# Patient Record
Sex: Female | Born: 1945
Health system: Southern US, Community
[De-identification: ages and names within clinical notes are randomized; demographics above are authoritative.]

## PROBLEM LIST (undated history)

## (undated) ENCOUNTER — Emergency Department: Admission: EM | Payer: PPO | Source: Home / Self Care

## (undated) DIAGNOSIS — I96 Gangrene, not elsewhere classified: Secondary | ICD-10-CM

## (undated) DIAGNOSIS — N182 Chronic kidney disease, stage 2 (mild): Secondary | ICD-10-CM

## (undated) DIAGNOSIS — Z950 Presence of cardiac pacemaker: Secondary | ICD-10-CM

## (undated) DIAGNOSIS — I4439 Other atrioventricular block: Secondary | ICD-10-CM

## (undated) DIAGNOSIS — I4430 Unspecified atrioventricular block: Secondary | ICD-10-CM

## (undated) DIAGNOSIS — B9620 Unspecified Escherichia coli [E. coli] as the cause of diseases classified elsewhere: Secondary | ICD-10-CM

## (undated) DIAGNOSIS — R079 Chest pain, unspecified: Secondary | ICD-10-CM

## (undated) DIAGNOSIS — F411 Generalized anxiety disorder: Secondary | ICD-10-CM

## (undated) DIAGNOSIS — F1010 Alcohol abuse, uncomplicated: Secondary | ICD-10-CM

## (undated) DIAGNOSIS — M5416 Radiculopathy, lumbar region: Secondary | ICD-10-CM

## (undated) DIAGNOSIS — I10 Essential (primary) hypertension: Secondary | ICD-10-CM

## (undated) DIAGNOSIS — I739 Peripheral vascular disease, unspecified: Secondary | ICD-10-CM

## (undated) DIAGNOSIS — J189 Pneumonia, unspecified organism: Secondary | ICD-10-CM

## (undated) DIAGNOSIS — J9611 Chronic respiratory failure with hypoxia: Secondary | ICD-10-CM

## (undated) DIAGNOSIS — I480 Paroxysmal atrial fibrillation: Secondary | ICD-10-CM

## (undated) DIAGNOSIS — H9313 Tinnitus, bilateral: Secondary | ICD-10-CM

## (undated) DIAGNOSIS — S0990XA Unspecified injury of head, initial encounter: Secondary | ICD-10-CM

## (undated) DIAGNOSIS — R221 Localized swelling, mass and lump, neck: Secondary | ICD-10-CM

## (undated) DIAGNOSIS — K30 Functional dyspepsia: Secondary | ICD-10-CM

## (undated) DIAGNOSIS — I4510 Unspecified right bundle-branch block: Secondary | ICD-10-CM

## (undated) DIAGNOSIS — E559 Vitamin D deficiency, unspecified: Secondary | ICD-10-CM

## (undated) DIAGNOSIS — M7989 Other specified soft tissue disorders: Secondary | ICD-10-CM

## (undated) DIAGNOSIS — J45909 Unspecified asthma, uncomplicated: Secondary | ICD-10-CM

## (undated) DIAGNOSIS — J309 Allergic rhinitis, unspecified: Secondary | ICD-10-CM

## (undated) DIAGNOSIS — F322 Major depressive disorder, single episode, severe without psychotic features: Secondary | ICD-10-CM

## (undated) DIAGNOSIS — R030 Elevated blood-pressure reading, without diagnosis of hypertension: Secondary | ICD-10-CM

## (undated) DIAGNOSIS — K219 Gastro-esophageal reflux disease without esophagitis: Secondary | ICD-10-CM

## (undated) DIAGNOSIS — Z720 Tobacco use: Secondary | ICD-10-CM

## (undated) DIAGNOSIS — M25512 Pain in left shoulder: Secondary | ICD-10-CM

## (undated) DIAGNOSIS — J441 Chronic obstructive pulmonary disease with (acute) exacerbation: Secondary | ICD-10-CM

## (undated) DIAGNOSIS — R928 Other abnormal and inconclusive findings on diagnostic imaging of breast: Secondary | ICD-10-CM

## (undated) DIAGNOSIS — N939 Abnormal uterine and vaginal bleeding, unspecified: Secondary | ICD-10-CM

## (undated) DIAGNOSIS — R042 Hemoptysis: Secondary | ICD-10-CM

## (undated) DIAGNOSIS — J9601 Acute respiratory failure with hypoxia: Secondary | ICD-10-CM

## (undated) DIAGNOSIS — R071 Chest pain on breathing: Secondary | ICD-10-CM

## (undated) DIAGNOSIS — R002 Palpitations: Secondary | ICD-10-CM

## (undated) DIAGNOSIS — M25562 Pain in left knee: Secondary | ICD-10-CM

## (undated) DIAGNOSIS — F419 Anxiety disorder, unspecified: Secondary | ICD-10-CM

## (undated) DIAGNOSIS — N390 Urinary tract infection, site not specified: Secondary | ICD-10-CM

## (undated) DIAGNOSIS — S0992XA Unspecified injury of nose, initial encounter: Secondary | ICD-10-CM

## (undated) HISTORY — DX: Unspecified Escherichia coli (E. coli) as the cause of diseases classified elsewhere: B96.20

## (undated) HISTORY — DX: Elevated blood-pressure reading, without diagnosis of hypertension: R03.0

## (undated) HISTORY — DX: Pain in left shoulder: M25.512

## (undated) HISTORY — DX: Functional dyspepsia: K30

## (undated) HISTORY — DX: Chronic obstructive pulmonary disease with (acute) exacerbation: J44.1

## (undated) HISTORY — DX: Tinnitus, bilateral: H93.13

## (undated) HISTORY — DX: Essential (primary) hypertension: I10

## (undated) HISTORY — DX: Major depressive disorder, single episode, severe without psychotic features: F32.2

## (undated) HISTORY — DX: Unspecified atrioventricular block: I44.30

## (undated) HISTORY — DX: Presence of cardiac pacemaker: Z95.0

## (undated) HISTORY — DX: Peripheral vascular disease, unspecified: I73.9

## (undated) HISTORY — DX: Other specified soft tissue disorders: M79.89

## (undated) HISTORY — DX: Generalized anxiety disorder: F41.1

## (undated) HISTORY — DX: Urinary tract infection, site not specified: N39.0

## (undated) HISTORY — DX: Tobacco use: Z72.0

## (undated) HISTORY — DX: Abnormal uterine and vaginal bleeding, unspecified: N93.9

## (undated) HISTORY — DX: Vitamin D deficiency, unspecified: E55.9

## (undated) HISTORY — DX: Radiculopathy, lumbar region: M54.16

## (undated) HISTORY — DX: Paroxysmal atrial fibrillation: I48.0

## (undated) HISTORY — DX: Unspecified injury of nose, initial encounter: S09.92XA

## (undated) HISTORY — DX: Chronic kidney disease, stage 2 (mild): N18.2

## (undated) HISTORY — DX: Unspecified right bundle-branch block: I45.10

## (undated) HISTORY — DX: Localized swelling, mass and lump, neck: R22.1

## (undated) HISTORY — DX: Other abnormal and inconclusive findings on diagnostic imaging of breast: R92.8

## (undated) HISTORY — DX: Chest pain, unspecified: R07.9

## (undated) HISTORY — DX: Chronic respiratory failure with hypoxia: J96.11

## (undated) HISTORY — PX: PACEMAKER INSERTION: SHX728

## (undated) HISTORY — DX: Hemoptysis: R04.2

## (undated) HISTORY — DX: Acute respiratory failure with hypoxia: J96.01

## (undated) HISTORY — DX: Other atrioventricular block: I44.39

## (undated) HISTORY — DX: Pneumonia, unspecified organism: J18.9

## (undated) HISTORY — DX: Unspecified injury of head, initial encounter: S09.90XA

## (undated) HISTORY — DX: Pain in left knee: M25.562

## (undated) HISTORY — DX: Allergic rhinitis, unspecified: J30.9

## (undated) HISTORY — DX: Palpitations: R00.2

## (undated) HISTORY — DX: Chest pain on breathing: R07.1

## (undated) HISTORY — DX: Unspecified asthma, uncomplicated: J45.909

## (undated) HISTORY — DX: Alcohol abuse, uncomplicated: F10.10

---
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
## 1972-08-15 HISTORY — PX: ECTOPIC PREGNANCY SURGERY: SHX613

## 2009-06-30 ENCOUNTER — Emergency Department (HOSPITAL_BASED_OUTPATIENT_CLINIC_OR_DEPARTMENT_OTHER): Admission: EM | Admit: 2009-06-30 | Discharge: 2009-06-30 | Payer: Self-pay | Admitting: Emergency Medicine

## 2009-06-30 ENCOUNTER — Ambulatory Visit: Payer: Self-pay | Admitting: Diagnostic Radiology

## 2010-10-13 ENCOUNTER — Encounter: Payer: Self-pay | Admitting: Family Medicine

## 2010-10-13 ENCOUNTER — Institutional Professional Consult (permissible substitution) (INDEPENDENT_AMBULATORY_CARE_PROVIDER_SITE_OTHER): Payer: PRIVATE HEALTH INSURANCE | Admitting: Family Medicine

## 2010-10-13 DIAGNOSIS — J45909 Unspecified asthma, uncomplicated: Secondary | ICD-10-CM

## 2010-10-13 DIAGNOSIS — J309 Allergic rhinitis, unspecified: Secondary | ICD-10-CM

## 2010-10-13 DIAGNOSIS — J209 Acute bronchitis, unspecified: Secondary | ICD-10-CM

## 2010-10-13 HISTORY — DX: Allergic rhinitis, unspecified: J30.9

## 2010-10-13 HISTORY — DX: Unspecified asthma, uncomplicated: J45.909

## 2010-10-21 NOTE — Assessment & Plan Note (Signed)
Summary: new   Vital Signs:  Patient profile:   65 year old female Height:      66.50 inches Weight:      127 pounds BMI:     20.26 Pulse rate:   74 / minute BP sitting:   130 / 84  (left arm)  Vitals Entered By: Doristine Devoid CMA (October 13, 2010 11:10 AM) CC: NEW EST- consult   History of Present Illness: 65 yo woman here today to establish care.  previous MD- Dr Duanne Limerick at [facility].  pt is switching care b/c previous MD 'misdiagnosed' relative and the person passed away (pt feels MDs decision directly impacted death)  Dr Margaretmary Lombard- Allergy/Asthma.  PulmDelford Field.  Asthma- on Advair, Spiriva, uses Proventil as needed.  sxs are well controlled until pt has URI.  has Nebulizer at home.  denies nocturnal sxs.  reports AM sxs are the worst.  Dr Margaretmary Lombard is referring her to [facility].  Allergies- using Nasonex but not regularly.  particularly sensitive to mildew, mold, dust.  not on antihistamine.  Bronchitis- was started on Doxy in January, never finished b/c she was feeling better.  now again having productive cough, no fevers, but some increased wheezing.  denies ear pain.  having some bloody nasal drainage  Health Maintainence- Sees Dr Neva Seat for paps and mammograms.  UTD on colonoscopy.  due for DEXA this year- no osteoporosis or osteopenia      Preventive Screening-Counseling & Management  Alcohol-Tobacco     Alcohol drinks/day: <1     Alcohol type: wine     Smoking Status: current     Smoking Cessation Counseling: yes     Smoke Cessation Stage: contemplative  Caffeine-Diet-Exercise     Does Patient Exercise: yes     [facility]      Sexual History:  currently monogamous.        Drug Use:  never.    Current Medications (verified): 1)  Advair Diskus 250-50 Mcg/dose Aepb (Fluticasone-Salmeterol) .... Use Two Times A Day 2)  Proventil Hfa 108 (90 Base) Mcg/act Aers (Albuterol Sulfate) .... Use Daily As Needed 3)  Albuterol Sulfate (2.5 Mg/12ml)  0.083% Nebu (Albuterol Sulfate) .... Use Via Nebulizer As Needed 4)  Nasonex 50 Mcg/act Susp (Mometasone Furoate) .... 2 Sprays Each Nostril Daily 5)  Ibuprofen 800 Mg Tabs (Ibuprofen) .... Take One Tablet As Needed 6)  Vitamin D (Ergocalciferol) 50000 Unit Caps (Ergocalciferol) .... Take One Tablet Monthly 7)  Cod Liver Oil  Oil (Cod Liver Oil) .... 2 Capsules Two Times A Day 8)  Oscal 500/200 D-3 500-200 Mg-Unit Tabs (Calcium Carbonate-Vitamin D) .... Take Two Times A Day 9)  Doxycycline Hyclate 100 Mg Caps (Doxycycline Hyclate) .... Take 1 Tab Twice A Day.  Take W/ Food.  Allergies (verified): No Known Drug Allergies  Past History:  Past Medical History: Asthma Arthritis  Allergic rhinitis smoker  Past Surgical History: ectopic pregnancy  Tonsillectomy Adenoids   Family History: CAD-mother CHF HTN-no DM-no STROKE-no COLON CA-no BREAST CA-no  Social History: 1 son, lives locally married retired from [employer] ([employer])Sexual History:  currently monogamous Drug Use:  never Smoking Status:  current Does Patient Exercise:  yes  Review of Systems      See HPI  Physical Exam  General:  Well-developed,well-nourished,in no acute distress; alert,appropriate and cooperative throughout examination Head:  Normocephalic and atraumatic without obvious abnormalities. No apparent alopecia or balding.  no TTP over sinuses Eyes:  PERRL, EOMI  Ears:  External ear exam shows no significant lesions or deformities.  Otoscopic examination reveals clear canals, tympanic membranes are intact bilaterally without bulging, retraction, inflammation or discharge. Hearing is grossly normal bilaterally. Nose:  + congestion and turbinate edema Mouth:  Oral mucosa and oropharynx without lesions or exudates.  Neck:  No deformities, masses, or tenderness noted. Lungs:  diffuse expiratory wheezes, rattling cough Heart:  reg S1/S2, no M/R/G Pulses:  +2 carotid, radial, DP Extremities:   no C/C/E Neurologic:  alert & oriented X3, cranial nerves II-XII intact, and gait normal.   Cervical Nodes:  No lymphadenopathy noted Psych:  Cognition and judgment appear intact. Alert and cooperative with normal attention span and concentration. No apparent delusions, illusions, hallucinations   Impression & Recommendations:  Problem # 1:  ASTHMA (ICD-493.90) Assessment New seeing allergist, has referral to pulm pending.  has wheezing on exam today- likely due to current bronchitis.  start abx.  encouraged increased use of albuterol inhaler while not feeling well. Her updated medication list for this problem includes:    Advair Diskus 250-50 Mcg/dose Aepb (Fluticasone-salmeterol) ..... Use two times a day    Proventil Hfa 108 (90 Base) Mcg/act Aers (Albuterol sulfate) ..... Use daily as needed    Albuterol Sulfate (2.5 Mg/14ml) 0.083% Nebu (Albuterol sulfate) ..... Use via nebulizer as needed  Problem # 2:  ALLERGIC RHINITIS (ICD-477.9) Assessment: New encouraged her to use nasal spray daily during spring due to amount of pollen.  also discussed possibility of oral antihistamine but will defer this to allergist. Her updated medication list for this problem includes:    Nasonex 50 Mcg/act Susp (Mometasone furoate) .Marland Kitchen... 2 sprays each nostril daily  Problem # 3:  BRONCHITIS- ACUTE (ICD-466.0) Assessment: New given underlying lung dz and productive cough will start abx to complete full course.  reviewed supportive care and red flags that should prompt return.  .Pt expresses understanding and is in agreement w/ this plan. Her updated medication list for this problem includes:    Advair Diskus 250-50 Mcg/dose Aepb (Fluticasone-salmeterol) ..... Use two times a day    Proventil Hfa 108 (90 Base) Mcg/act Aers (Albuterol sulfate) ..... Use daily as needed    Albuterol Sulfate (2.5 Mg/72ml) 0.083% Nebu (Albuterol sulfate) ..... Use via nebulizer as needed    Doxycycline Hyclate 100 Mg Caps  (Doxycycline hyclate) .Marland Kitchen... Take 1 tab twice a day.  take w/ food.  Complete Medication List: 1)  Advair Diskus 250-50 Mcg/dose Aepb (Fluticasone-salmeterol) .... Use two times a day 2)  Proventil Hfa 108 (90 Base) Mcg/act Aers (Albuterol sulfate) .... Use daily as needed 3)  Albuterol Sulfate (2.5 Mg/27ml) 0.083% Nebu (Albuterol sulfate) .... Use via nebulizer as needed 4)  Nasonex 50 Mcg/act Susp (Mometasone furoate) .... 2 sprays each nostril daily 5)  Ibuprofen 800 Mg Tabs (Ibuprofen) .... Take one tablet as needed 6)  Vitamin D (ergocalciferol) 50000 Unit Caps (Ergocalciferol) .... Take one tablet monthly 7)  Cod Liver Oil Oil (Cod liver oil) .... 2 capsules two times a day 8)  Oscal 500/200 D-3 500-200 Mg-unit Tabs (Calcium carbonate-vitamin d) .... Take two times a day 9)  Doxycycline Hyclate 100 Mg Caps (Doxycycline hyclate) .... Take 1 tab twice a day.  take w/ food.  Patient Instructions: 1)  Welcome!  We're glad to have you! 2)  Once we review your records we'll determine when you need to follow up 3)  Call with any questions or concerns 4)  Think of [place] as your home  base- we're here to help! Prescriptions: DOXYCYCLINE HYCLATE 100 MG CAPS (DOXYCYCLINE HYCLATE) Take 1 tab twice a day.  take w/ food.  #14 x 0   Entered and Authorized by:   Neena Rhymes MD   Signed by:   Neena Rhymes MD on 10/13/2010   Method used:   Electronically to        [facility]  [address] [phone]* (retail)       [address]       [place], [place]  [address]       Ph: [phone] or [phone]       Fax: [phone]   RxID:   [id number]    Orders Added: 1)  New Patient Level III [id number]

## 2011-03-21 ENCOUNTER — Ambulatory Visit: Payer: PRIVATE HEALTH INSURANCE

## 2011-03-21 ENCOUNTER — Telehealth: Payer: Self-pay

## 2011-03-21 NOTE — Telephone Encounter (Signed)
OK to have here

## 2011-03-21 NOTE — Telephone Encounter (Signed)
Discussed with patient and she  wanted to get the vaccine done in the office, I advised her I would schedule but seh can call insurance company and if send to the pharmacy is cheaper for her we would fax it and she agreed.    KP

## 2011-03-21 NOTE — Telephone Encounter (Signed)
Call from patient requesting Shingles Vaccine-  Stated she has had a few friends with and she does not want to get it, says she is will to pay out of pocket if insurance does not cover it. Please advise if this is ok to order   KP

## 2011-03-21 NOTE — Telephone Encounter (Signed)
No problem.  I think is less expensive for her to go her pharmacy or to [facility] and get it there. Give a Rx for Zostavax #1 dose

## 2011-03-22 ENCOUNTER — Ambulatory Visit (INDEPENDENT_AMBULATORY_CARE_PROVIDER_SITE_OTHER): Payer: PRIVATE HEALTH INSURANCE | Admitting: *Deleted

## 2011-03-22 DIAGNOSIS — Z23 Encounter for immunization: Secondary | ICD-10-CM

## 2011-04-01 ENCOUNTER — Encounter: Payer: Self-pay | Admitting: *Deleted

## 2011-04-01 ENCOUNTER — Encounter: Payer: Self-pay | Admitting: Critical Care Medicine

## 2011-04-04 ENCOUNTER — Institutional Professional Consult (permissible substitution): Payer: PRIVATE HEALTH INSURANCE | Admitting: Critical Care Medicine

## 2011-04-11 ENCOUNTER — Telehealth: Payer: Self-pay | Admitting: Family Medicine

## 2011-04-11 ENCOUNTER — Encounter: Payer: Self-pay | Admitting: Critical Care Medicine

## 2011-04-11 ENCOUNTER — Ambulatory Visit (HOSPITAL_BASED_OUTPATIENT_CLINIC_OR_DEPARTMENT_OTHER)
Admission: RE | Admit: 2011-04-11 | Discharge: 2011-04-11 | Disposition: A | Discharge status: Home / Self Care | Payer: Medicare Other | Source: Ambulatory Visit | Attending: Critical Care Medicine | Admitting: Critical Care Medicine

## 2011-04-11 ENCOUNTER — Ambulatory Visit (INDEPENDENT_AMBULATORY_CARE_PROVIDER_SITE_OTHER): Payer: PRIVATE HEALTH INSURANCE | Admitting: Critical Care Medicine

## 2011-04-11 DIAGNOSIS — R059 Cough, unspecified: Secondary | ICD-10-CM | POA: Insufficient documentation

## 2011-04-11 DIAGNOSIS — J449 Chronic obstructive pulmonary disease, unspecified: Secondary | ICD-10-CM

## 2011-04-11 DIAGNOSIS — F17200 Nicotine dependence, unspecified, uncomplicated: Secondary | ICD-10-CM

## 2011-04-11 DIAGNOSIS — J45909 Unspecified asthma, uncomplicated: Secondary | ICD-10-CM

## 2011-04-11 DIAGNOSIS — J4489 Other specified chronic obstructive pulmonary disease: Secondary | ICD-10-CM | POA: Insufficient documentation

## 2011-04-11 DIAGNOSIS — Z720 Tobacco use: Secondary | ICD-10-CM | POA: Insufficient documentation

## 2011-04-11 DIAGNOSIS — R05 Cough: Secondary | ICD-10-CM

## 2011-04-11 DIAGNOSIS — J309 Allergic rhinitis, unspecified: Secondary | ICD-10-CM

## 2011-04-11 MED ORDER — DOXYCYCLINE HYCLATE 100 MG PO TBEC: 100.0000 mg | DELAYED_RELEASE_TABLET | Freq: Two times a day (BID) | ORAL | Status: DC

## 2011-04-11 MED ORDER — NICOTINE 10 MG IN INHA: RESPIRATORY_TRACT | Status: DC

## 2011-04-11 NOTE — Telephone Encounter (Signed)
This is ok

## 2011-04-11 NOTE — Progress Notes (Signed)
Subjective:    Patient ID: Jenna Marquez, female    DOB: 12/10/1945, 65 y.o.   MRN: [id number] 65 y.o. [facility] referred for COPD AB HPI Comments: Prior [facility] pt.  Dr Beaulah Dinning referred for second opinion for Copd Dx Copd by [facility] 2010.    Dx with asthma as a child.  Environmental allergies per [place]: trees, mold, dust pollen, never Rx allergy shots  Shortness of Breath This is a chronic problem. The current episode started more than 1 year ago. The problem occurs constantly. The problem has been unchanged. Duration: dyspnea worse with exertion, not at rest,  few minutes will recover with purselip breathing. Associated symptoms include wheezing. Pertinent negatives include no abdominal pain, chest pain, ear pain, fever, headaches, hemoptysis, leg pain, leg swelling, neck pain, orthopnea, PND, rash, rhinorrhea, sore throat, sputum production or vomiting. The symptoms are aggravated by emotional upset, any activity, pollens, weather changes, smoke, URIs and animal exposure. Associated symptoms comments: Only times cough will be with neb use or advair use . She has tried beta agonist inhalers, steroid inhalers and oral steroids for the symptoms. The treatment provided significant relief. Her past medical history is significant for allergies, aspirin allergies, asthma and COPD. There is no history of DVT, a heart failure, PE, pneumonia or a recent surgery.    Past Medical History  Diagnosis Date  . COPD (chronic obstructive pulmonary disease)   . Tobacco abuse   . Asthma   . Allergic rhinitis   . Arthritis      Family History  Problem Relation Age of Onset  . Coronary artery disease Mother      History   Social History  . Marital Status: Married    Spouse Name: N/A    Number of Children: 1  . Years of Education: N/A   Occupational History  . Retired [employer]   Social History Main Topics  . Smoking status: Current Everyday Smoker -- 0.3 packs/day    Types:  Cigarettes  . Smokeless tobacco: Never Used   Comment: Started at age 57  . Alcohol Use: Yes     socially  . Drug Use: No  . Sexually Active: Not on file   Other Topics Concern  . Not on file   Social History Narrative  . No narrative on file     Allergies  Allergen Reactions  . Avelox (Moxifloxacin Hcl In Nacl)      Outpatient Prescriptions Prior to Visit  Medication Sig Dispense Refill  . albuterol (PROVENTIL HFA) 108 (90 BASE) MCG/ACT inhaler Inhale 2 puffs into the lungs every 6 (six) hours as needed.        Marland Kitchen albuterol (PROVENTIL) (2.5 MG/3ML) 0.083% nebulizer solution Take 2.5 mg by nebulization every 6 (six) hours as needed.        . ergocalciferol (VITAMIN D2) 50000 UNITS capsule Take 50,000 Units by mouth every 30 (thirty) days.        . Fluticasone-Salmeterol (ADVAIR DISKUS) 250-50 MCG/DOSE AEPB Inhale 1 puff into the lungs every 12 (twelve) hours.        Marland Kitchen ibuprofen (ADVIL,MOTRIN) 800 MG tablet Take 800 mg by mouth every 8 (eight) hours as needed.        Marland Kitchen Cod Liver Oil CAPS Take 2 capsules by mouth 2 (two) times daily.        Marland Kitchen doxycycline (DORYX) 100 MG EC tablet Take 100 mg by mouth 2 (two) times daily.        Marland Kitchen  mometasone (NASONEX) 50 MCG/ACT nasal spray Place 2 sprays into the nose daily.             Review of Systems  Constitutional: Positive for unexpected weight change. Negative for fever, chills, diaphoresis, activity change, appetite change and fatigue.  HENT: Positive for congestion and sneezing. Negative for hearing loss, ear pain, nosebleeds, sore throat, facial swelling, rhinorrhea, mouth sores, trouble swallowing, neck pain, neck stiffness, dental problem, voice change, postnasal drip, sinus pressure, tinnitus and ear discharge.   Eyes: Negative for photophobia, discharge, itching and visual disturbance.  Respiratory: Positive for cough, shortness of breath and wheezing. Negative for apnea, hemoptysis, sputum production, choking, chest tightness and  stridor.   Cardiovascular: Negative for chest pain, palpitations, orthopnea, leg swelling and PND.  Gastrointestinal: Negative for nausea, vomiting, abdominal pain, constipation, blood in stool and abdominal distention.  Genitourinary: Negative for dysuria, urgency, frequency, hematuria, flank pain, decreased urine volume and difficulty urinating.  Musculoskeletal: Negative for myalgias, back pain, joint swelling, arthralgias and gait problem.  Skin: Negative for color change, pallor and rash.  Neurological: Negative for dizziness, tremors, seizures, syncope, speech difficulty, weakness, light-headedness, numbness and headaches.  Hematological: Negative for adenopathy. Does not bruise/bleed easily.  Psychiatric/Behavioral: Negative for confusion, sleep disturbance and agitation. The patient is not nervous/anxious.        Objective:   Physical Exam Filed Vitals:   04/11/11 0959  BP: 144/78  Pulse: 89  Temp: 97.8 F (36.6 C)  TempSrc: Oral  Height: 5\' 7"  (1.702 m)  Weight: 127 lb (57.607 kg)  SpO2: 98%    Gen: Pleasant, well-nourished, in no distress,  normal affect  ENT: No lesions,  mouth clear,  oropharynx clear, no postnasal drip  Neck: No JVD, no TMG, no carotid bruits  Lungs: No use of accessory muscles, no dullness to percussion, distant BS, exp wheezes.    Cardiovascular: RRR, heart sounds normal, no murmur or gallops, no peripheral edema  Abdomen: soft and NT, no HSM,  BS normal  Musculoskeletal: No deformities, no cyanosis or clubbing  Neuro: alert, non focal  Skin: Warm, no lesions or rashes    CXR 8/12>>> Spiro 2/12: FeV1 49% Fef 25 75 22%  09/2010    Assessment & Plan:   COPD (chronic obstructive pulmonary disease) Golds Stage III Copd  FeV1 49% Fef 25 75 22%  09/2010 Active tobacco use 03/2011 Primary focus now is smoking cessation Plan Cont advair/spiriva Doxycycline for bronchitis flare x 7days Use nicotrol for smoking cessation >25min smoking  cessation given to the patient Check cxr today    Updated Medication List Outpatient Encounter Prescriptions as of 04/11/2011  Medication Sig Dispense Refill  . albuterol (PROVENTIL HFA) 108 (90 BASE) MCG/ACT inhaler Inhale 2 puffs into the lungs every 6 (six) hours as needed.        Marland Kitchen albuterol (PROVENTIL) (2.5 MG/3ML) 0.083% nebulizer solution Take 2.5 mg by nebulization every 6 (six) hours as needed.        . Calcium 500 MG CHEW Chew 1 tablet by mouth 2 (two) times daily.        . ergocalciferol (VITAMIN D2) 50000 UNITS capsule Take 50,000 Units by mouth every 30 (thirty) days.        . Fluticasone-Salmeterol (ADVAIR DISKUS) 250-50 MCG/DOSE AEPB Inhale 1 puff into the lungs every 12 (twelve) hours.        Marland Kitchen ibuprofen (ADVIL,MOTRIN) 800 MG tablet Take 800 mg by mouth every 8 (eight) hours as needed.        Marland Kitchen  SPIRIVA HANDIHALER 18 MCG inhalation capsule Place 1 puff into inhaler and inhale Daily.      Marland Kitchen doxycycline (DORYX) 100 MG EC tablet Take 1 tablet (100 mg total) by mouth 2 (two) times daily.  14 tablet  0  . nicotine (NICOTROL) 10 MG inhaler Use 6 cartridges per day, 60-80 puff per cartridge  150 each  2  . DISCONTD: Cod Liver Oil CAPS Take 2 capsules by mouth 2 (two) times daily.        Marland Kitchen DISCONTD: doxycycline (DORYX) 100 MG EC tablet Take 100 mg by mouth 2 (two) times daily.        Marland Kitchen DISCONTD: mometasone (NASONEX) 50 MCG/ACT nasal spray Place 2 sprays into the nose daily.

## 2011-04-11 NOTE — Telephone Encounter (Signed)
Patient states that she saw Dr. Delford Field this morning and forgot to ask for a new small(compact) nebulizer. The one she has does not work correctly.

## 2011-04-11 NOTE — Assessment & Plan Note (Signed)
Golds Stage III Copd  FeV1 49% Fef 25 75 22%  09/2010 Active tobacco use 03/2011 Primary focus now is smoking cessation Plan Cont advair/spiriva Doxycycline for bronchitis flare x 7days Use nicotrol for smoking cessation >80min smoking cessation given to the patient Check cxr today

## 2011-04-11 NOTE — Patient Instructions (Signed)
Use nicotrol 6 cartridges per day 60-80 puff per cartridge to stop smoking Use doxycycline one twice daily for 7days Stay on advair and spiriva Chest xray today  Return 6 weeks [place]

## 2011-04-11 NOTE — Telephone Encounter (Signed)
Dr. Delford Field, pt requesting a new neb machine.  States the one she has does not work correctly.  [facility].  Is this ok?

## 2011-04-11 NOTE — Progress Notes (Signed)
Quick Note:  Notify the patient that the Xray is stable and no active lung disease, No cancer No change in medications are recommended. Continue current meds as prescribed at office visit this am. ______

## 2011-04-11 NOTE — Telephone Encounter (Signed)
Order sent to [facility] for new neb machine. LMOM for pt to be made aware.

## 2011-04-12 NOTE — Progress Notes (Signed)
Quick Note:  Called, spoke with pt. She is aware of cxr results and recs per PW. She verbalized understanding of this. ______

## 2011-04-13 ENCOUNTER — Telehealth: Payer: Self-pay | Admitting: Critical Care Medicine

## 2011-04-13 DIAGNOSIS — J209 Acute bronchitis, unspecified: Secondary | ICD-10-CM

## 2011-04-13 MED ORDER — DOXYCYCLINE HYCLATE 50 MG PO CAPS: 100.0000 mg | ORAL_CAPSULE | Freq: Two times a day (BID) | ORAL | Status: AC

## 2011-04-13 NOTE — Telephone Encounter (Signed)
[facility] called and wants different doxy called in  I sent new RX

## 2011-04-19 ENCOUNTER — Other Ambulatory Visit: Payer: Self-pay | Admitting: Family Medicine

## 2011-04-19 DIAGNOSIS — J449 Chronic obstructive pulmonary disease, unspecified: Secondary | ICD-10-CM

## 2011-04-20 ENCOUNTER — Other Ambulatory Visit: Payer: Self-pay | Admitting: Family Medicine

## 2011-04-20 DIAGNOSIS — J449 Chronic obstructive pulmonary disease, unspecified: Secondary | ICD-10-CM

## 2011-05-02 ENCOUNTER — Telehealth: Payer: Self-pay | Admitting: Critical Care Medicine

## 2011-05-02 MED ORDER — TIOTROPIUM BROMIDE MONOHYDRATE 18 MCG IN CAPS: 18.0000 ug | ORAL_CAPSULE | Freq: Every day | RESPIRATORY_TRACT | Status: DC

## 2011-05-02 MED ORDER — FLUTICASONE-SALMETEROL 250-50 MCG/DOSE IN AEPB: 1.0000 | INHALATION_SPRAY | Freq: Two times a day (BID) | RESPIRATORY_TRACT | Status: DC

## 2011-05-02 NOTE — Telephone Encounter (Signed)
meds have been sent to the pharmacy--i called the pharmacy since the meds were called in under Dr. Pat Kocher pharmacy is aware that these meds should be under Dr. Delford Field.  The pt is aware of meds at the pharmacy

## 2011-06-10 ENCOUNTER — Telehealth: Payer: Self-pay | Admitting: Family Medicine

## 2011-06-10 NOTE — Telephone Encounter (Signed)
Last OV  04-11-11, never filled by you. Please advise

## 2011-06-11 NOTE — Telephone Encounter (Signed)
Ok for #60, 1 refill 

## 2011-06-13 ENCOUNTER — Other Ambulatory Visit: Payer: Self-pay | Admitting: *Deleted

## 2011-06-13 MED ORDER — IBUPROFEN 800 MG PO TABS
800.0000 mg | ORAL_TABLET | Freq: Three times a day (TID) | ORAL | Status: DC | PRN
Start: 2011-06-13 — End: 2012-07-28

## 2011-06-13 NOTE — Telephone Encounter (Signed)
rx sent to pharmacy by e-script  

## 2011-06-17 ENCOUNTER — Encounter: Payer: Self-pay | Admitting: Family Medicine

## 2011-06-20 ENCOUNTER — Ambulatory Visit: Payer: Medicare Other | Admitting: Family Medicine

## 2011-06-30 ENCOUNTER — Telehealth: Payer: Self-pay | Admitting: *Deleted

## 2011-06-30 ENCOUNTER — Other Ambulatory Visit: Payer: Self-pay | Admitting: Family Medicine

## 2011-06-30 NOTE — Telephone Encounter (Signed)
Spoke to [employer] from [facility] to advise that Jenna Marquez has an appt for 07-01-11 for a diagnostic mammogram and ultradsound. However this pt was new to establish with you on 10-13-10 and we have no order for this testing and no background for the Left breast calcification that [facility] office noted as the past issue on May 2011. In centricity the note states that Dr Neva Seat is who she sees for paps and mammograms. Do you want the order sent or to refer office to Dr Neva Seat?

## 2011-07-01 NOTE — Telephone Encounter (Signed)
Called the [facility] to advise the orders would need to come from Dr. Neva Seat per he is in charge of her paps/mammograms. Spoke to kim at center and she understood and will call Dr Neva Seat for orders.

## 2011-07-01 NOTE — Telephone Encounter (Signed)
Since she sees Dr Neva Seat for paps and mammos the order should come from her

## 2011-07-14 ENCOUNTER — Encounter: Payer: Self-pay | Admitting: Critical Care Medicine

## 2011-07-14 ENCOUNTER — Ambulatory Visit (INDEPENDENT_AMBULATORY_CARE_PROVIDER_SITE_OTHER): Payer: Medicare Other | Admitting: Critical Care Medicine

## 2011-07-14 VITALS — BP 130/80 | HR 84 | Temp 97.8°F | Ht 66.0 in | Wt 123.0 lb

## 2011-07-14 DIAGNOSIS — J449 Chronic obstructive pulmonary disease, unspecified: Secondary | ICD-10-CM

## 2011-07-14 DIAGNOSIS — Z23 Encounter for immunization: Secondary | ICD-10-CM

## 2011-07-14 MED ORDER — PNEUMOCOCCAL VAC POLYVALENT 25 MCG/0.5ML IJ INJ: 0.5000 mL | INJECTION | INTRAMUSCULAR | Status: DC

## 2011-07-14 NOTE — Progress Notes (Signed)
Subjective:    Patient ID: Jenna Marquez, female    DOB: April 26, 1946, 65 y.o.   MRN: [id number] 65 y.o. AAF referred for COPD AB HPI Comments: Prior [place] pt.  Dr Beaulah Dinning referred for second opinion for Copd Dx Copd by [facility] 2010.    Dx with asthma as a child.  Environmental allergies per [facility]: trees, mold, dust pollen, never Rx allergy shots  07/14/2011 Cont advair/spiriva Doxycycline for bronchitis flare x 7days Use nicotrol for smoking cessation >60min smoking cessation given to the patient  Now down to 1-2 cig per day.    Now some am cough, mucus is clear.  No real chest pain.  No real wheeze.  No edema in feet.  Sleeps through night ok Pt denies any significant sore throat, nasal congestion or excess secretions, fever, chills, sweats, unintended weight loss, pleurtic or exertional chest pain, orthopnea PND, or leg swelling Pt denies any increase in rescue therapy over baseline, denies waking up needing it or having any early am or nocturnal exacerbations of coughing/wheezing/or dyspnea. Pt also denies any obvious fluctuation in symptoms with  weather or environmental change or other alleviating or aggravating factors     Past Medical History  Diagnosis Date  . COPD (chronic obstructive pulmonary disease)   . Tobacco abuse   . Asthma   . Allergic rhinitis   . Arthritis      Family History  Problem Relation Age of Onset  . Coronary artery disease Mother   . Heart disease Mother     CHF  . Diabetes Neg Hx   . Cancer Neg Hx   . Stroke Neg Hx      History   Social History  . Marital Status: Married    Spouse Name: N/A    Number of Children: 1  . Years of Education: N/A   Occupational History  . Retired Old [employer]   Social History Main Topics  . Smoking status: Current Everyday Smoker -- 0.3 packs/day    Types: Cigarettes  . Smokeless tobacco: Never Used   Comment: Started at age 58  . Alcohol Use: Yes     socially  . Drug Use:  No  . Sexually Active: Not on file   Other Topics Concern  . Not on file   Social History Narrative  . No narrative on file     Allergies  Allergen Reactions  . Avelox (Moxifloxacin Hcl In Nacl)      Outpatient Prescriptions Prior to Visit  Medication Sig Dispense Refill  . albuterol (PROVENTIL HFA) 108 (90 BASE) MCG/ACT inhaler Inhale 2 puffs into the lungs every 6 (six) hours as needed.        Marland Kitchen albuterol (PROVENTIL) (2.5 MG/3ML) 0.083% nebulizer solution Take 2.5 mg by nebulization every 6 (six) hours as needed.        . Calcium 500 MG CHEW Chew 1 tablet by mouth 2 (two) times daily.        . COD LIVER OIL PO Take by mouth 2 (two) times daily. Take tow capsules       . ergocalciferol (VITAMIN D2) 50000 UNITS capsule Take 50,000 Units by mouth every 30 (thirty) days.        . Fluticasone-Salmeterol (ADVAIR DISKUS) 250-50 MCG/DOSE AEPB Inhale 1 puff into the lungs every 12 (twelve) hours.  60 each  6  . ibuprofen (ADVIL,MOTRIN) 800 MG tablet Take 1 tablet (800 mg total) by mouth every 8 (eight) hours as needed.  60 tablet  1  . mometasone (NASONEX) 50 MCG/ACT nasal spray Place 2 sprays into the nose as needed.       . tiotropium (SPIRIVA HANDIHALER) 18 MCG inhalation capsule Place 1 capsule (18 mcg total) into inhaler and inhale daily.  30 capsule  6  . nicotine (NICOTROL) 10 MG inhaler Use 6 cartridges per day, 60-80 puff per cartridge  150 each  2   No facility-administered medications prior to visit.       Review of Systems  Constitutional: Positive for unexpected weight change. Negative for chills, diaphoresis, activity change, appetite change and fatigue.  HENT: Negative for hearing loss, nosebleeds, congestion, facial swelling, sneezing, mouth sores, trouble swallowing, neck stiffness, dental problem, voice change, postnasal drip, sinus pressure, tinnitus and ear discharge.   Eyes: Negative for photophobia, discharge, itching and visual disturbance.  Respiratory: Negative  for apnea, cough, choking, chest tightness and stridor.   Cardiovascular: Negative for palpitations.  Gastrointestinal: Negative for nausea, constipation, blood in stool and abdominal distention.  Genitourinary: Negative for dysuria, urgency, frequency, hematuria, flank pain, decreased urine volume and difficulty urinating.  Musculoskeletal: Negative for myalgias, back pain, joint swelling, arthralgias and gait problem.  Skin: Negative for color change and pallor.  Neurological: Negative for dizziness, tremors, seizures, syncope, speech difficulty, weakness, light-headedness and numbness.  Hematological: Negative for adenopathy. Does not bruise/bleed easily.  Psychiatric/Behavioral: Negative for confusion, sleep disturbance and agitation. The patient is not nervous/anxious.        Objective:   Physical Exam  Filed Vitals:   07/14/11 0911  BP: 130/80  Pulse: 84  Temp: 97.8 F (36.6 C)  TempSrc: Oral  Height: 5\' 6"  (1.676 m)  Weight: 123 lb (55.792 kg)  SpO2: 99%    Gen: Pleasant, well-nourished, in no distress,  normal affect  ENT: No lesions,  mouth clear,  oropharynx clear, no postnasal drip  Neck: No JVD, no TMG, no carotid bruits  Lungs: No use of accessory muscles, no dullness to percussion, distant BS, poor airflow  Cardiovascular: RRR, heart sounds normal, no murmur or gallops, no peripheral edema  Abdomen: soft and NT, no HSM,  BS normal  Musculoskeletal: No deformities, no cyanosis or clubbing  Neuro: alert, non focal  Skin: Warm, no lesions or rashes    CXR 8/12>>> Spiro 2/12: FeV1 49% Fef 25 75 22%  09/2010    Assessment & Plan:   COPD (chronic obstructive pulmonary disease) Golds Stage III Copd  FeV1 49% Fef 25 75 22%  09/2010 Active tobacco use 03/2011>>>reduced to 2cig/d 11/12  Overall improved. Needs to work on tobacco use further  Plan Cont efforts at smoking cessation No change in inhaled or maintenance medications.        Updated  Medication List Outpatient Encounter Prescriptions as of 07/14/2011  Medication Sig Dispense Refill  . albuterol (PROVENTIL HFA) 108 (90 BASE) MCG/ACT inhaler Inhale 2 puffs into the lungs every 6 (six) hours as needed.        Marland Kitchen albuterol (PROVENTIL) (2.5 MG/3ML) 0.083% nebulizer solution Take 2.5 mg by nebulization every 6 (six) hours as needed.        . Calcium 500 MG CHEW Chew 1 tablet by mouth 2 (two) times daily.        . COD LIVER OIL PO Take by mouth 2 (two) times daily. Take tow capsules       . ergocalciferol (VITAMIN D2) 50000 UNITS capsule Take 50,000 Units by mouth every 30 (thirty) days.        Marland Kitchen  Fluticasone-Salmeterol (ADVAIR DISKUS) 250-50 MCG/DOSE AEPB Inhale 1 puff into the lungs every 12 (twelve) hours.  60 each  6  . ibuprofen (ADVIL,MOTRIN) 800 MG tablet Take 1 tablet (800 mg total) by mouth every 8 (eight) hours as needed.  60 tablet  1  . mometasone (NASONEX) 50 MCG/ACT nasal spray Place 2 sprays into the nose as needed.       . tiotropium (SPIRIVA HANDIHALER) 18 MCG inhalation capsule Place 1 capsule (18 mcg total) into inhaler and inhale daily.  30 capsule  6  . DISCONTD: doxycycline (VIBRAMYCIN) 50 MG capsule       . DISCONTD: nicotine (NICOTROL) 10 MG inhaler Use 6 cartridges per day, 60-80 puff per cartridge  150 each  2   Facility-Administered Encounter Medications as of 07/14/2011  Medication Dose Route Frequency Provider Last Rate Last Dose  . DISCONTD: pneumococcal 23 valent vaccine (PNU-IMMUNE) injection 0.5 mL  0.5 mL Intramuscular Tomorrow-1000 Shan Levans, MD

## 2011-07-14 NOTE — Assessment & Plan Note (Signed)
Golds Stage III Copd  FeV1 49% Fef 25 75 22%  09/2010 Active tobacco use 03/2011>>>reduced to 2cig/d 11/12  Overall improved. Needs to work on tobacco use further  Plan Cont efforts at smoking cessation No change in inhaled or maintenance medications.

## 2011-07-14 NOTE — Patient Instructions (Signed)
Flu vaccine and pneumovax given No change in medications. Return in        4 months Work on stopping smoking using the Nicorette Minis 2mg  as needed

## 2011-07-20 ENCOUNTER — Ambulatory Visit (INDEPENDENT_AMBULATORY_CARE_PROVIDER_SITE_OTHER): Payer: Medicare Other | Admitting: Family Medicine

## 2011-07-20 ENCOUNTER — Encounter: Payer: Self-pay | Admitting: Family Medicine

## 2011-07-20 DIAGNOSIS — Z0000 Encounter for general adult medical examination without abnormal findings: Secondary | ICD-10-CM | POA: Insufficient documentation

## 2011-07-20 DIAGNOSIS — Z79899 Other long term (current) drug therapy: Secondary | ICD-10-CM

## 2011-07-20 DIAGNOSIS — R928 Other abnormal and inconclusive findings on diagnostic imaging of breast: Secondary | ICD-10-CM

## 2011-07-20 DIAGNOSIS — Z720 Tobacco use: Secondary | ICD-10-CM

## 2011-07-20 DIAGNOSIS — I4510 Unspecified right bundle-branch block: Secondary | ICD-10-CM

## 2011-07-20 HISTORY — DX: Other abnormal and inconclusive findings on diagnostic imaging of breast: R92.8

## 2011-07-20 NOTE — Patient Instructions (Signed)
We'll notify you of your lab results and make any changes as needed If you are feeling depressed or overwhelmed- please let me know Call with any questions or concerns Hang in there! Happy Holidays!!!

## 2011-07-20 NOTE — Progress Notes (Signed)
  Subjective:    Patient ID: Jenna Marquez, female    DOB: 04-26-1946, 65 y.o.   MRN: [id number]  HPI Here today for CPE.  Risk Factors: Tobacco use- current every day smoking.  Not interested in quitting. Abnormal mammo- had abnormal results 11/21 and will need f/u US.  This has been scheduled. Physical Activity: very active but not exercising regularly. Fall Risk: low risk Depression: situational depression due to husband's severe illness and subsequent recovery Hearing: normal to whispered voice at 6 ft ADL's: independent Cognitive: normal linear thought process, memory and attention intact. Home Safety: safe at home, lives w/ husband Height, Weight, BMI, Visual Acuity: see vitals, vision corrected to 20/20 w/ glasses Counseling: UTD on pap and mammo, colonoscopy 2008 (Dr Talmadge Chad) Labs Ordered: See A&P Care Plan: See A&P    Review of Systems Patient reports no vision/ hearing changes, adenopathy,fever, weight change,  persistant/recurrent hoarseness , swallowing issues, chest pain, palpitations, edema, persistant/recurrent cough, hemoptysis, dyspnea (rest/exertional/paroxysmal nocturnal), gastrointestinal bleeding (melena, rectal bleeding), abdominal pain, significant heartburn, bowel changes, GU symptoms (dysuria, hematuria, incontinence), Gyn symptoms (abnormal  bleeding, pain),  syncope, focal weakness, memory loss, numbness & tingling, skin/hair/nail changes, abnormal bruising or bleeding, anxiety, or depression.     Objective:   Physical Exam General Appearance:    Alert, cooperative, no distress, appears stated age  Head:    Normocephalic, without obvious abnormality, atraumatic  Eyes:    PERRL, conjunctiva/corneas clear, EOM's intact, fundi    benign, both eyes  Ears:    Normal TM's and external ear canals, both ears  Nose:   Nares normal, septum midline, mucosa normal, no drainage    or sinus tenderness  Throat:   Lips, mucosa, and tongue normal; teeth and gums  normal  Neck:   Supple, symmetrical, trachea midline, no adenopathy;    Thyroid: no enlargement/tenderness/nodules  Back:     Symmetric, no curvature, ROM normal, no CVA tenderness  Lungs:     Clear to auscultation bilaterally, respirations unlabored  Chest Wall:    No tenderness or deformity   Heart:    Regular rate and rhythm, S1 and S2 normal, no murmur, rub   or gallop  Breast Exam:    Deferred to GYN  Abdomen:     Soft, non-tender, bowel sounds active all four quadrants,    no masses, no organomegaly  Genitalia:    Deferred to GYN  Rectal:    Extremities:   Extremities normal, atraumatic, no cyanosis or edema  Pulses:   2+ and symmetric all extremities  Skin:   Skin color, texture, turgor normal, no rashes or lesions  Lymph nodes:   Cervical, supraclavicular, and axillary nodes normal  Neurologic:   CNII-XII intact, normal strength, sensation and reflexes    throughout          Assessment & Plan:

## 2011-07-21 LAB — BASIC METABOLIC PANEL
CO2: 26 mEq/L (ref 19–32)
Chloride: 105 mEq/L (ref 96–112)
Glucose, Bld: 86 mg/dL (ref 70–99)
Sodium: 141 mEq/L (ref 135–145)

## 2011-07-21 LAB — CBC WITH DIFFERENTIAL/PLATELET
Basophils Absolute: 0 10*3/uL (ref 0.0–0.1)
Eosinophils Relative: 1.7 % (ref 0.0–5.0)
Hemoglobin: 14.6 g/dL (ref 12.0–15.0)
Lymphocytes Relative: 34.2 % (ref 12.0–46.0)
Monocytes Relative: 6.9 % (ref 3.0–12.0)
Platelets: 245 10*3/uL (ref 150.0–400.0)
RDW: 14.1 % (ref 11.5–14.6)
WBC: 6.2 10*3/uL (ref 4.5–10.5)

## 2011-07-21 LAB — HEPATIC FUNCTION PANEL
ALT: 19 U/L (ref 0–35)
AST: 35 U/L (ref 0–37)
Albumin: 4.2 g/dL (ref 3.5–5.2)
Alkaline Phosphatase: 76 U/L (ref 39–117)
Bilirubin, Direct: 0.1 mg/dL (ref 0.0–0.3)
Total Protein: 7.3 g/dL (ref 6.0–8.3)

## 2011-07-21 LAB — LIPID PANEL: Total CHOL/HDL Ratio: 2

## 2011-07-21 LAB — LDL CHOLESTEROL, DIRECT: Direct LDL: 117.6 mg/dL

## 2011-07-23 LAB — VITAMIN D 1,25 DIHYDROXY
Vitamin D 1, 25 (OH)2 Total: 59 pg/mL (ref 18–72)
Vitamin D2 1, 25 (OH)2: 36 pg/mL
Vitamin D3 1, 25 (OH)2: 23 pg/mL

## 2011-07-25 ENCOUNTER — Encounter: Payer: Self-pay | Admitting: *Deleted

## 2011-07-31 DIAGNOSIS — I4510 Unspecified right bundle-branch block: Secondary | ICD-10-CM | POA: Insufficient documentation

## 2011-07-31 HISTORY — DX: Unspecified right bundle-branch block: I45.10

## 2011-07-31 NOTE — Assessment & Plan Note (Signed)
Pt has f/u scheduled.  Encouraged her to keep this appt

## 2011-07-31 NOTE — Assessment & Plan Note (Signed)
Pt's PE WNL.  UTD on health maintenance.  Check labs.  EKG done- see document for interpretation.  Anticipatory guidance provided.  

## 2011-07-31 NOTE — Assessment & Plan Note (Signed)
New on EKG today.  Refer to cards.

## 2011-07-31 NOTE — Assessment & Plan Note (Signed)
Strongly encouraged pt to quit.  Will follow.

## 2011-08-18 ENCOUNTER — Encounter: Payer: Self-pay | Admitting: Cardiovascular Disease

## 2011-08-18 ENCOUNTER — Ambulatory Visit (INDEPENDENT_AMBULATORY_CARE_PROVIDER_SITE_OTHER): Payer: Medicare Other | Admitting: Cardiovascular Disease

## 2011-08-18 VITALS — BP 161/77 | HR 94 | Ht 66.5 in | Wt 123.8 lb

## 2011-08-18 DIAGNOSIS — R079 Chest pain, unspecified: Secondary | ICD-10-CM | POA: Insufficient documentation

## 2011-08-18 DIAGNOSIS — R0602 Shortness of breath: Secondary | ICD-10-CM

## 2011-08-18 DIAGNOSIS — J45909 Unspecified asthma, uncomplicated: Secondary | ICD-10-CM

## 2011-08-18 HISTORY — DX: Chest pain, unspecified: R07.9

## 2011-08-18 NOTE — Assessment & Plan Note (Signed)
Atypical under right rib.  With smoking and RBBB will do stress echo.  She can walk on treadmill.

## 2011-08-18 NOTE — Assessment & Plan Note (Signed)
Has had flu shot and pneumovax.  Continue inhalers and F/U Dr Delford Field Discussed smoking cessation

## 2011-08-18 NOTE — Patient Instructions (Signed)
Your physician recommends that you schedule a follow-up appointment in: AS NEEDED Your physician recommends that you continue on your current medications as directed. Please refer to the Current Medication list given to you today. Your physician has requested that you have a stress echocardiogram. For further information please visit [email or web address] Please follow instruction sheet as given. DX SHORTNESS OF BREATH

## 2011-08-18 NOTE — Assessment & Plan Note (Signed)
Benign likely reflective of lung disease.  Will get old ECG;s from [place] to see if it is old.

## 2011-08-18 NOTE — Progress Notes (Signed)
66 yo referred by Dr Beverely Low for RBBB on ECG.  No old ECG;s in our system but has had lots at [facility] medical clinic which have not been sent for.  Long time smoker with significant COPD sees Dr Delford Field.  Has occasional pain under right ribs that is positional.,  Very stressed lately over husbands. Health.  Sounds like he had complications from an ERCP at [facility] hospital and there may be legal action.  She walks and use to go to the gym with no exertional SSCP palpitaitons.  Occasional flairs of COPD with wheezing.  Compliant with meds and always has her rescue inhaler.  No syncope.  Indicates having normal echo and stress test in the past at [facility]  ROS: Denies fever, malais, weight loss, blurry vision, decreased visual acuity, cough, sputum, SOB, hemoptysis, pleuritic pain, palpitaitons, heartburn, abdominal pain, melena, lower extremity edema, claudication, or rash.  All other systems reviewed and negative   General: Affect appropriate Thin black female HEENT: normal Neck supple with no adenopathy JVP normal no bruits no thyromegaly Lungs diffuse  wheezing and good diaphragmatic motion Heart:  S1/S2 no murmur,rub, gallop or click PMI normal Abdomen: benighn, BS positve, no tenderness, no AAA no bruit.  No HSM or HJR Distal pulses intact with no bruits No edema Neuro non-focal Skin warm and dry No muscular weakness  Medications Current Outpatient Prescriptions  Medication Sig Dispense Refill  . albuterol (PROVENTIL HFA) 108 (90 BASE) MCG/ACT inhaler Inhale 2 puffs into the lungs every 6 (six) hours as needed.        Marland Kitchen albuterol (PROVENTIL) (2.5 MG/3ML) 0.083% nebulizer solution Take 2.5 mg by nebulization every 6 (six) hours as needed.        . Calcium 500 MG CHEW Chew 1 tablet by mouth 2 (two) times daily.        . COD LIVER OIL PO Take by mouth 2 (two) times daily. Take tow capsules       . ergocalciferol (VITAMIN D2) 50000 UNITS capsule Take 50,000 Units by mouth every 30 (thirty) days.         . Fluticasone-Salmeterol (ADVAIR DISKUS) 250-50 MCG/DOSE AEPB Inhale 1 puff into the lungs every 12 (twelve) hours.  60 each  6  . ibuprofen (ADVIL,MOTRIN) 800 MG tablet Take 1 tablet (800 mg total) by mouth every 8 (eight) hours as needed.  60 tablet  1  . mometasone (NASONEX) 50 MCG/ACT nasal spray Place 2 sprays into the nose as needed.       . tiotropium (SPIRIVA HANDIHALER) 18 MCG inhalation capsule Place 1 capsule (18 mcg total) into inhaler and inhale daily.  30 capsule  6    Allergies Avelox  Family History: Family History  Problem Relation Age of Onset  . Coronary artery disease Mother   . Heart disease Mother     CHF  . Diabetes Neg Hx   . Cancer Neg Hx   . Stroke Neg Hx     Social History: History   Social History  . Marital Status: Married    Spouse Name: N/A    Number of Children: 1  . Years of Education: N/A   Occupational History  . Retired Old [employer]   Social History Main Topics  . Smoking status: Current Everyday Smoker -- 0.3 packs/day    Types: Cigarettes  . Smokeless tobacco: Never Used   Comment: Started at age 51  . Alcohol Use: Yes     socially  .  Drug Use: No  . Sexually Active: Not on file   Other Topics Concern  . Not on file   Social History Narrative  . No narrative on file    Electrocardiogram:  NSR RBBB  Assessment and Plan

## 2011-08-19 ENCOUNTER — Telehealth: Payer: Self-pay | Admitting: Cardiovascular Disease

## 2011-08-19 NOTE — Telephone Encounter (Addendum)
ROI faxed to [facility] @ [phone] 08/19/11/KM  Records Received from [facility] gave to [facility]  08/19/11/KM

## 2011-09-02 ENCOUNTER — Other Ambulatory Visit (HOSPITAL_COMMUNITY): Payer: Medicare Other | Admitting: Radiology

## 2011-09-09 ENCOUNTER — Other Ambulatory Visit (HOSPITAL_COMMUNITY): Payer: Medicare Other | Admitting: Radiology

## 2011-09-15 ENCOUNTER — Telehealth: Payer: Self-pay | Admitting: Cardiovascular Disease

## 2011-09-15 NOTE — Telephone Encounter (Signed)
Pt cxl echo echo stress, did not rs

## 2011-09-16 ENCOUNTER — Other Ambulatory Visit (HOSPITAL_COMMUNITY): Payer: Medicare Other | Admitting: Radiology

## 2011-09-20 NOTE — Telephone Encounter (Signed)
NOTED ./CY 

## 2011-10-06 ENCOUNTER — Telehealth: Payer: Self-pay | Admitting: Family Medicine

## 2011-10-06 NOTE — Telephone Encounter (Signed)
Patient called stating that she received a bill from 03/2011 Shingles shot, she has called the 800# listed & is was a non working #. I offered her the local billing # & she stated she wanted to talk to the nurse that falsified her records. I told her you would call her upon your return Thanks

## 2011-10-07 ENCOUNTER — Telehealth: Payer: Self-pay | Admitting: Family Medicine

## 2011-10-07 NOTE — Telephone Encounter (Signed)
Open by mistake, still in training sorry

## 2011-10-27 ENCOUNTER — Encounter: Payer: Self-pay | Admitting: Critical Care Medicine

## 2011-10-27 ENCOUNTER — Ambulatory Visit (INDEPENDENT_AMBULATORY_CARE_PROVIDER_SITE_OTHER): Payer: Medicare Other | Admitting: Critical Care Medicine

## 2011-10-27 VITALS — BP 122/72 | HR 89 | Temp 97.5°F | Ht 66.5 in | Wt 123.0 lb

## 2011-10-27 DIAGNOSIS — J449 Chronic obstructive pulmonary disease, unspecified: Secondary | ICD-10-CM

## 2011-10-27 NOTE — Patient Instructions (Signed)
No change in medications. Return in         4 months 

## 2011-10-27 NOTE — Progress Notes (Signed)
Subjective:    Patient ID: Jenna Marquez, female    DOB: 09/19/1945, 66 y.o.   MRN: [id number] 66 y.o. AAF referred for COPD AB HPI Comments: Prior [place] pt.  Dr Beaulah Dinning referred for second opinion for Copd Dx Copd by [facility] 2010.    Dx with asthma as a child.  Environmental allergies per [facility]: trees, mold, dust pollen, never Rx allergy shots  11/29 Cont advair/spiriva Doxycycline for bronchitis flare x 7days Use nicotrol for smoking cessation >74min smoking cessation given to the patient  Now down to 1-2 cig per day.    Now some am cough, mucus is clear.  No real chest pain.  No real wheeze.  No edema in feet.  Sleeps through night ok Pt denies any significant sore throat, nasal congestion or excess secretions, fever, chills, sweats, unintended weight loss, pleurtic or exertional chest pain, orthopnea PND, or leg swelling Pt denies any increase in rescue therapy over baseline, denies waking up needing it or having any early am or nocturnal exacerbations of coughing/wheezing/or dyspnea. Pt also denies any obvious fluctuation in symptoms with  weather or environmental change or other alleviating or aggravating factors  10/27/2011 Last seen 11/29 and no changes made Hx of copd ongoing tobacco use Pt now notes sl better.  Spring is worse for the patient. Now on e cigarettes.   Some mucus in the am.  Not using rescue inhaler as before    Past Medical History  Diagnosis Date  . COPD (chronic obstructive pulmonary disease)   . Tobacco abuse   . Asthma   . Allergic rhinitis   . Arthritis      Family History  Problem Relation Age of Onset  . Coronary artery disease Mother   . Heart disease Mother     CHF  . Diabetes Neg Hx   . Cancer Neg Hx   . Stroke Neg Hx      History   Social History  . Marital Status: Married    Spouse Name: N/A    Number of Children: 1  . Years of Education: N/A   Occupational History  . Retired Old [employer]   Social  History Main Topics  . Smoking status: Former Smoker -- 0.3 packs/day    Types: Cigarettes    Quit date: 10/18/2011  . Smokeless tobacco: Never Used   Comment: Started at age 38  . Alcohol Use: Yes     socially  . Drug Use: No  . Sexually Active: Not on file   Other Topics Concern  . Not on file   Social History Narrative  . No narrative on file     Allergies  Allergen Reactions  . Avelox (Moxifloxacin Hcl In Nacl)      Outpatient Prescriptions Prior to Visit  Medication Sig Dispense Refill  . albuterol (PROVENTIL HFA) 108 (90 BASE) MCG/ACT inhaler Inhale 2 puffs into the lungs every 6 (six) hours as needed.        Marland Kitchen albuterol (PROVENTIL) (2.5 MG/3ML) 0.083% nebulizer solution Take 2.5 mg by nebulization every 6 (six) hours as needed.        . Calcium 500 MG CHEW Chew 1 tablet by mouth 2 (two) times daily.        . ergocalciferol (VITAMIN D2) 50000 UNITS capsule Take 50,000 Units by mouth every 30 (thirty) days.        . Fluticasone-Salmeterol (ADVAIR DISKUS) 250-50 MCG/DOSE AEPB Inhale 1 puff into the lungs every 12 (  twelve) hours.  60 each  6  . ibuprofen (ADVIL,MOTRIN) 800 MG tablet Take 1 tablet (800 mg total) by mouth every 8 (eight) hours as needed.  60 tablet  1  . mometasone (NASONEX) 50 MCG/ACT nasal spray Place 2 sprays into the nose as needed.       . tiotropium (SPIRIVA HANDIHALER) 18 MCG inhalation capsule Place 1 capsule (18 mcg total) into inhaler and inhale daily.  30 capsule  6  . COD LIVER OIL PO Take by mouth 2 (two) times daily. Take tow capsules            Review of Systems  Constitutional: Positive for unexpected weight change. Negative for chills, diaphoresis, activity change, appetite change and fatigue.  HENT: Negative for hearing loss, nosebleeds, congestion, facial swelling, sneezing, mouth sores, trouble swallowing, neck stiffness, dental problem, voice change, postnasal drip, sinus pressure, tinnitus and ear discharge.   Eyes: Negative for  photophobia, discharge, itching and visual disturbance.  Respiratory: Negative for apnea, cough, choking, chest tightness and stridor.   Cardiovascular: Negative for palpitations.  Gastrointestinal: Negative for nausea, constipation, blood in stool and abdominal distention.  Genitourinary: Negative for dysuria, urgency, frequency, hematuria, flank pain, decreased urine volume and difficulty urinating.  Musculoskeletal: Negative for myalgias, back pain, joint swelling, arthralgias and gait problem.  Skin: Negative for color change and pallor.  Neurological: Negative for dizziness, tremors, seizures, syncope, speech difficulty, weakness, light-headedness and numbness.  Hematological: Negative for adenopathy. Does not bruise/bleed easily.  Psychiatric/Behavioral: Negative for confusion, sleep disturbance and agitation. The patient is not nervous/anxious.        Objective:   Physical Exam  Filed Vitals:   10/27/11 0939  BP: 122/72  Pulse: 89  Temp: 97.5 F (36.4 C)  TempSrc: Oral  Height: 5' 6.5" (1.689 m)  Weight: 123 lb (55.792 kg)  SpO2: 100%    Gen: Pleasant, well-nourished, in no distress,  normal affect  ENT: No lesions,  mouth clear,  oropharynx clear, no postnasal drip  Neck: No JVD, no TMG, no carotid bruits  Lungs: No use of accessory muscles, no dullness to percussion, distant BS, poor airflow  Cardiovascular: RRR, heart sounds normal, no murmur or gallops, no peripheral edema  Abdomen: soft and NT, no HSM,  BS normal  Musculoskeletal: No deformities, no cyanosis or clubbing  Neuro: alert, non focal  Skin: Warm, no lesions or rashes    CXR 8/12>>> Spiro 2/12: FeV1 49% Fef 25 75 22%  09/2010    Assessment & Plan:   COPD (chronic obstructive pulmonary disease) Golds Stage  C  COPD  CAT score 6 10/27/2011 FeV1 49% Fef 25 75 22%  09/2010 Active tobacco use 03/2011>>>reduced to 2cig/d 11/12  Overall improved.  Now on e cigarette,  I counseled the pt this not  free of lung damage Plan No change in inhaled or maintenance medications. Return in   4 months      Updated Medication List Outpatient Encounter Prescriptions as of 10/27/2011  Medication Sig Dispense Refill  . albuterol (PROVENTIL HFA) 108 (90 BASE) MCG/ACT inhaler Inhale 2 puffs into the lungs every 6 (six) hours as needed.        Marland Kitchen albuterol (PROVENTIL) (2.5 MG/3ML) 0.083% nebulizer solution Take 2.5 mg by nebulization every 6 (six) hours as needed.        . Calcium 500 MG CHEW Chew 1 tablet by mouth 2 (two) times daily.        . ergocalciferol (VITAMIN D2) 50000 UNITS  capsule Take 50,000 Units by mouth every 30 (thirty) days.        . Fluticasone-Salmeterol (ADVAIR DISKUS) 250-50 MCG/DOSE AEPB Inhale 1 puff into the lungs every 12 (twelve) hours.  60 each  6  . ibuprofen (ADVIL,MOTRIN) 800 MG tablet Take 1 tablet (800 mg total) by mouth every 8 (eight) hours as needed.  60 tablet  1  . mometasone (NASONEX) 50 MCG/ACT nasal spray Place 2 sprays into the nose as needed.       . tiotropium (SPIRIVA HANDIHALER) 18 MCG inhalation capsule Place 1 capsule (18 mcg total) into inhaler and inhale daily.  30 capsule  6  . COD LIVER OIL PO Take by mouth 2 (two) times daily. Take tow capsules

## 2011-10-27 NOTE — Assessment & Plan Note (Addendum)
[facility]  C  COPD  CAT score 6 10/27/2011 FeV1 49% Fef 25 75 22%  09/2010 Active tobacco use 03/2011>>>reduced to 2cig/d 11/12  Overall improved.  Now on e cigarette,  I counseled the pt this not free of lung damage Plan No change in inhaled or maintenance medications. Return in   4 months

## 2011-12-23 ENCOUNTER — Telehealth: Payer: Self-pay | Admitting: Critical Care Medicine

## 2011-12-23 MED ORDER — FLUTICASONE-SALMETEROL 250-50 MCG/DOSE IN AEPB: 1.0000 | INHALATION_SPRAY | Freq: Two times a day (BID) | RESPIRATORY_TRACT | Status: DC

## 2011-12-23 MED ORDER — TIOTROPIUM BROMIDE MONOHYDRATE 18 MCG IN CAPS: 18.0000 ug | ORAL_CAPSULE | Freq: Every day | RESPIRATORY_TRACT | Status: DC

## 2011-12-23 NOTE — Telephone Encounter (Signed)
Refills sent. Pt is aware. Nirav Sweda, CMA  

## 2011-12-31 ENCOUNTER — Encounter (HOSPITAL_BASED_OUTPATIENT_CLINIC_OR_DEPARTMENT_OTHER): Payer: Self-pay | Admitting: *Deleted

## 2011-12-31 ENCOUNTER — Emergency Department (HOSPITAL_BASED_OUTPATIENT_CLINIC_OR_DEPARTMENT_OTHER)
Admission: EM | Admit: 2011-12-31 | Discharge: 2011-12-31 | Disposition: A | Discharge status: Home / Self Care | Payer: Medicare Other | Attending: Emergency Medicine | Admitting: Emergency Medicine

## 2011-12-31 ENCOUNTER — Emergency Department (HOSPITAL_BASED_OUTPATIENT_CLINIC_OR_DEPARTMENT_OTHER): Payer: Medicare Other

## 2011-12-31 DIAGNOSIS — F411 Generalized anxiety disorder: Secondary | ICD-10-CM | POA: Insufficient documentation

## 2011-12-31 DIAGNOSIS — Z79899 Other long term (current) drug therapy: Secondary | ICD-10-CM | POA: Insufficient documentation

## 2011-12-31 DIAGNOSIS — M129 Arthropathy, unspecified: Secondary | ICD-10-CM | POA: Insufficient documentation

## 2011-12-31 DIAGNOSIS — J449 Chronic obstructive pulmonary disease, unspecified: Secondary | ICD-10-CM | POA: Insufficient documentation

## 2011-12-31 DIAGNOSIS — J4489 Other specified chronic obstructive pulmonary disease: Secondary | ICD-10-CM | POA: Insufficient documentation

## 2011-12-31 DIAGNOSIS — F419 Anxiety disorder, unspecified: Secondary | ICD-10-CM

## 2011-12-31 DIAGNOSIS — R079 Chest pain, unspecified: Secondary | ICD-10-CM | POA: Insufficient documentation

## 2011-12-31 LAB — CBC
Hemoglobin: 13.3 g/dL (ref 12.0–15.0)
MCH: 34 pg (ref 26.0–34.0)
MCHC: 34.3 g/dL (ref 30.0–36.0)
MCV: 99.2 fL (ref 78.0–100.0)

## 2011-12-31 LAB — BASIC METABOLIC PANEL
BUN: 15 mg/dL (ref 6–23)
Chloride: 104 mEq/L (ref 96–112)
Creatinine, Ser: 0.9 mg/dL (ref 0.50–1.10)
GFR calc Af Amer: 74 mL/min — ABNORMAL LOW (ref >90)
GFR calc non Af Amer: 64 mL/min — ABNORMAL LOW (ref >90)

## 2011-12-31 LAB — DIFFERENTIAL
Basophils Relative: 1 % (ref 0–1)
Eosinophils Absolute: 0.7 10*3/uL (ref 0.0–0.7)
Eosinophils Relative: 9 % — ABNORMAL HIGH (ref 0–5)
Monocytes Relative: 13 % — ABNORMAL HIGH (ref 3–12)
Neutrophils Relative %: 43 % (ref 43–77)

## 2011-12-31 MED ORDER — ASPIRIN 81 MG PO CHEW
324.0000 mg | CHEWABLE_TABLET | Freq: Once | ORAL | Status: AC
  Administered 2011-12-31: 324 mg via ORAL

## 2011-12-31 MED ORDER — ALPRAZOLAM 0.25 MG PO TABS: 0.2500 mg | ORAL_TABLET | Freq: Every evening | ORAL | Status: AC | PRN

## 2011-12-31 MED ORDER — LORAZEPAM 2 MG/ML IJ SOLN
1.0000 mg | Freq: Once | INTRAMUSCULAR | Status: AC
  Administered 2011-12-31: 1 mg via INTRAVENOUS

## 2011-12-31 NOTE — ED Provider Notes (Signed)
History     CSN: [id number]  Arrival date & time 12/31/11  0046   First MD Initiated Contact with Patient 12/31/11 0044      Chief Complaint  Patient presents with  . Chest Pain    L side under L breast that started poss last night.    (Consider location/radiation/quality/duration/timing/severity/associated sxs/prior treatment) HPI Comments: Patient presents complaining of left chest pain that does radiate across her entire chest.  She notes it began last night.  There's no specific relieving factors.  Patient does note that her husband passed away on 01/24/2023 morning which was a sudden event.  The funeral is to occur tomorrow.  Patient is obviously very upset and anxious regarding these events.  She notes no significant shortness of breath although she does have a history of COPD and does regularly use her inhalers and her nebulizer treatment as directed by her pulmonologist.  She denies any fevers.  She has clear sputum that she's coughing up.  She denies prior cardiac history.  Her symptoms are nonexertional.  She describes the pain as a tightness.  Patient is a 66 y.o. female presenting with chest pain. The history is provided by the patient. No language interpreter was used.  Chest Pain The chest pain began yesterday. Pertinent negatives for primary symptoms include no fever, no shortness of breath, no cough, no abdominal pain, no nausea and no vomiting.     Past Medical History  Diagnosis Date  . COPD (chronic obstructive pulmonary disease)   . Tobacco abuse   . Asthma   . Allergic rhinitis   . Arthritis     Past Surgical History  Procedure Date  . Ectopic pregnancy surgery   . Tonsillectomy and adenoidectomy at age 29    Family History  Problem Relation Age of Onset  . Coronary artery disease Mother   . Heart disease Mother     CHF  . Diabetes Neg Hx   . Cancer Neg Hx   . Stroke Neg Hx     History  Substance Use Topics  . Smoking status: Former Smoker -- 0.3  packs/day    Types: Cigarettes    Quit date: 10/18/2011  . Smokeless tobacco: Never Used   Comment: Started at age 56  . Alcohol Use: Yes     socially    OB History    Grav Para Term Preterm Abortions TAB SAB Ect Mult Living                  Review of Systems  Constitutional: Negative.  Negative for fever and chills.  HENT: Negative.   Eyes: Negative.  Negative for discharge and redness.  Respiratory: Negative.  Negative for cough and shortness of breath.   Cardiovascular: Positive for chest pain.  Gastrointestinal: Negative.  Negative for nausea, vomiting, abdominal pain and diarrhea.  Genitourinary: Negative.  Negative for dysuria and vaginal discharge.  Musculoskeletal: Negative.  Negative for back pain.  Skin: Negative.  Negative for color change and rash.  Neurological: Negative.  Negative for syncope and headaches.  Hematological: Negative.  Negative for adenopathy.  Psychiatric/Behavioral: Negative for confusion. The patient is nervous/anxious.   All other systems reviewed and are negative.    Allergies  Avelox  Home Medications   Current Outpatient Rx  Name Route Sig Dispense Refill  . ALBUTEROL SULFATE HFA 108 (90 BASE) MCG/ACT IN AERS Inhalation Inhale 2 puffs into the lungs every 6 (six) hours as needed.      Marland Kitchen  ALBUTEROL SULFATE (2.5 MG/3ML) 0.083% IN NEBU Nebulization Take 2.5 mg by nebulization every 6 (six) hours as needed.      Marland Kitchen CALCIUM 500 MG PO CHEW Oral Chew 1 tablet by mouth 2 (two) times daily.      . COD LIVER OIL PO Oral Take by mouth 2 (two) times daily. Take tow capsules     . ERGOCALCIFEROL 50000 UNITS PO CAPS Oral Take 50,000 Units by mouth every 30 (thirty) days.      Marland Kitchen FLUTICASONE-SALMETEROL 250-50 MCG/DOSE IN AEPB Inhalation Inhale 1 puff into the lungs every 12 (twelve) hours. 60 each 6  . IBUPROFEN 800 MG PO TABS Oral Take 1 tablet (800 mg total) by mouth every 8 (eight) hours as needed. 60 tablet 1  . MOMETASONE FUROATE 50 MCG/ACT NA SUSP  Nasal Place 2 sprays into the nose as needed.     Marland Kitchen TIOTROPIUM BROMIDE MONOHYDRATE 18 MCG IN CAPS Inhalation Place 1 capsule (18 mcg total) into inhaler and inhale daily. 30 capsule 6    BP 142/77  Pulse 95  Resp 20  Ht 5\' 7"  (1.702 m)  Wt 120 lb (54.432 kg)  BMI 18.79 kg/m2  SpO2 100%  Physical Exam  Nursing note and vitals reviewed. Constitutional: She is oriented to person, place, and time. She appears well-developed and well-nourished.  Non-toxic appearance. She does not have a sickly appearance.  HENT:  Head: Normocephalic and atraumatic.  Eyes: Conjunctivae, EOM and lids are normal. Pupils are equal, round, and reactive to light. No scleral icterus.  Neck: Trachea normal and normal range of motion. Neck supple.  Cardiovascular: Normal rate, regular rhythm and normal heart sounds.   Pulmonary/Chest: Effort normal and breath sounds normal. No respiratory distress. She has no wheezes. She has no rales.  Abdominal: Soft. Normal appearance. There is no tenderness. There is no rebound, no guarding and no CVA tenderness.  Musculoskeletal: Normal range of motion.  Neurological: She is alert and oriented to person, place, and time. She has normal strength.  Skin: Skin is warm, dry and intact. No rash noted.  Psychiatric: She has a normal mood and affect. Her behavior is normal. Judgment and thought content normal.    ED Course  Procedures (including critical care time)  Results for orders placed during the hospital encounter of 12/31/11  BASIC METABOLIC PANEL      Component Value Range   Sodium 142  135 - 145 (mEq/L)   Potassium 4.1  3.5 - 5.1 (mEq/L)   Chloride 104  96 - 112 (mEq/L)   CO2 27  19 - 32 (mEq/L)   Glucose, Bld 80  70 - 99 (mg/dL)   BUN 15  6 - 23 (mg/dL)   Creatinine, Ser 5.62  0.50 - 1.10 (mg/dL)   Calcium 9.9  8.4 - 13.0 (mg/dL)   GFR calc non Af Amer 64 (*) >90 (mL/min)   GFR calc Af Amer 74 (*) >90 (mL/min)  TROPONIN I      Component Value Range   Troponin  I <0.30  <0.30 (ng/mL)  CBC      Component Value Range   WBC 7.5  4.0 - 10.5 (K/uL)   RBC 3.91  3.87 - 5.11 (MIL/uL)   Hemoglobin 13.3  12.0 - 15.0 (g/dL)   HCT 86.5  78.4 - 69.6 (%)   MCV 99.2  78.0 - 100.0 (fL)   MCH 34.0  26.0 - 34.0 (pg)   MCHC 34.3  30.0 - 36.0 (g/dL)  RDW 11.8  11.5 - 15.5 (%)   Platelets 318  150 - 400 (K/uL)  DIFFERENTIAL      Component Value Range   Neutrophils Relative 43  43 - 77 (%)   Neutro Abs 3.3  1.7 - 7.7 (K/uL)   Lymphocytes Relative 34  12 - 46 (%)   Lymphs Abs 2.6  0.7 - 4.0 (K/uL)   Monocytes Relative 13 (*) 3 - 12 (%)   Monocytes Absolute 1.0  0.1 - 1.0 (K/uL)   Eosinophils Relative 9 (*) 0 - 5 (%)   Eosinophils Absolute 0.7  0.0 - 0.7 (K/uL)   Basophils Relative 1  0 - 1 (%)   Basophils Absolute 0.1  0.0 - 0.1 (K/uL)     Date: 12/31/2011  Rate: 90  Rhythm: normal sinus rhythm  QRS Axis: normal  Intervals: normal  ST/T Wave abnormalities: normal  Conduction Disutrbances:Incomplete right bundle branch block  Narrative Interpretation:   Old EKG Reviewed: No significant change from 07/20/2011      MDM  While patient has some risk factors for cardiac disease since she is a smoker patient's symptoms are atypical.  Patient is clearly under significant amount of stress given her husband's death earlier this week and the funeral tomorrow.  Her EKG shows no acute changes and I will perform 2 sets of cardiac markers 90 minutes apart to check for any change.  Patient has had her symptoms for over 24 hours now so I feel we will be able to effectively rule out MI at this time.  I will encourage her and her family that she should followup with her primary care physician later this week for reevaluation.  If the repeat troponin is still negative I believe the patient be discharged home so that she can tend to her husband's funeral later this morning.  She shows no acute signs of pneumonia and her symptoms are not typical for PE either.  I have  visualized her chest x-ray myself and in comparison to old one and see no signs of infiltrate on it.        Nat Christen, MD 12/31/11 [phone]

## 2011-12-31 NOTE — ED Notes (Signed)
Pt. In SR on monitor with no distress noted.  Pt. IV site is patent and flushed after blood draw.  Pt. Family at bedside.  Pt. Is resting with eyes closed and no complaints of pain.

## 2011-12-31 NOTE — Discharge Instructions (Signed)
Chest Pain (Nonspecific) It is often hard to give a specific diagnosis for the cause of chest pain. There is always a chance that your pain could be related to something serious, such as a heart attack or a blood clot in the lungs. You need to follow up with your caregiver for further evaluation. CAUSES   Heartburn.   Pneumonia or bronchitis.   Anxiety or stress.   Inflammation around your heart (pericarditis) or lung (pleuritis or pleurisy).   A blood clot in the lung.   A collapsed lung (pneumothorax). It can develop suddenly on its own (spontaneous pneumothorax) or from injury (trauma) to the chest.   Shingles infection (herpes zoster virus).  The chest wall is composed of bones, muscles, and cartilage. Any of these can be the source of the pain.  The bones can be bruised by injury.   The muscles or cartilage can be strained by coughing or overwork.   The cartilage can be affected by inflammation and become sore (costochondritis).  DIAGNOSIS  Lab tests or other studies, such as X-rays, electrocardiography, stress testing, or cardiac imaging, may be needed to find the cause of your pain.  TREATMENT   Treatment depends on what may be causing your chest pain. Treatment may include:   Acid blockers for heartburn.   Anti-inflammatory medicine.   Pain medicine for inflammatory conditions.   Antibiotics if an infection is present.   You may be advised to change lifestyle habits. This includes stopping smoking and avoiding alcohol, caffeine, and chocolate.   You may be advised to keep your head raised (elevated) when sleeping. This reduces the chance of acid going backward from your stomach into your esophagus.   Most of the time, nonspecific chest pain will improve within 2 to 3 days with rest and mild pain medicine.  HOME CARE INSTRUCTIONS   If antibiotics were prescribed, take your antibiotics as directed. Finish them even if you start to feel better.   For the next few  days, avoid physical activities that bring on chest pain. Continue physical activities as directed.   Do not smoke.   Avoid drinking alcohol.   Only take over-the-counter or prescription medicine for pain, discomfort, or fever as directed by your caregiver.   Follow your caregiver's suggestions for further testing if your chest pain does not go away.   Keep any follow-up appointments you made. If you do not go to an appointment, you could develop lasting (chronic) problems with pain. If there is any problem keeping an appointment, you must call to reschedule.  SEEK MEDICAL CARE IF:   You think you are having problems from the medicine you are taking. Read your medicine instructions carefully.   Your chest pain does not go away, even after treatment.   You develop a rash with blisters on your chest.  SEEK IMMEDIATE MEDICAL CARE IF:   You have increased chest pain or pain that spreads to your arm, neck, jaw, back, or abdomen.   You develop shortness of breath, an increasing cough, or you are coughing up blood.   You have severe back or abdominal pain, feel nauseous, or vomit.   You develop severe weakness, fainting, or chills.   You have a fever.  THIS IS AN EMERGENCY. Do not wait to see if the pain will go away. Get medical help at once. Call your local emergency services (911 in [place] Do not drive yourself to the hospital. MAKE SURE YOU:   Understand these instructions.     Will watch your condition.   Will get help right away if you are not doing well or get worse.  Document Released: 05/11/2005 Document Revised: 07/21/2011 Document Reviewed: 03/06/2008 [employer] Patient Information 2012 [employer]. 

## 2011-12-31 NOTE — ED Notes (Signed)
Pt. Reports husband died on Monday and he will be buried on Tomorrow

## 2011-12-31 NOTE — ED Notes (Signed)
Family at pt. Bedside

## 2012-01-04 ENCOUNTER — Telehealth: Payer: Self-pay | Admitting: Critical Care Medicine

## 2012-01-04 MED ORDER — ALBUTEROL SULFATE HFA 108 (90 BASE) MCG/ACT IN AERS: 2.0000 | INHALATION_SPRAY | Freq: Four times a day (QID) | RESPIRATORY_TRACT | Status: DC | PRN

## 2012-01-04 NOTE — Telephone Encounter (Signed)
Aware and noted.

## 2012-01-04 NOTE — Telephone Encounter (Signed)
I spoke with pt and she states she needed her proventile rx sent to the pharmacy. I advised pt will send in rx. Also pt wanted to let PW know that her husband passed away 2013/01/16 Massive MI in the middle of night. Gave pt my apologies and advised will let PW know this. Please advise Dr. Delford Field thanks

## 2012-01-11 ENCOUNTER — Ambulatory Visit (INDEPENDENT_AMBULATORY_CARE_PROVIDER_SITE_OTHER): Payer: Medicare Other | Admitting: Family Medicine

## 2012-01-11 ENCOUNTER — Encounter: Payer: Self-pay | Admitting: Family Medicine

## 2012-01-11 DIAGNOSIS — F411 Generalized anxiety disorder: Secondary | ICD-10-CM

## 2012-01-11 DIAGNOSIS — F4323 Adjustment disorder with mixed anxiety and depressed mood: Secondary | ICD-10-CM

## 2012-01-11 DIAGNOSIS — R079 Chest pain, unspecified: Secondary | ICD-10-CM

## 2012-01-11 HISTORY — DX: Generalized anxiety disorder: F41.1

## 2012-01-11 NOTE — Progress Notes (Signed)
  Subjective:    Patient ID: Jenna Marquez, female    DOB: 08/07/1946, 66 y.o.   MRN: [id number]  HPI ER f/u- pt went to ER on 01-03-2023 w/ CP after husband died at home suddenly on 12-29-22.  Had normal labs, EKG w/out acute changes.  Had persistent R BBB.  Has seen Dr Eden Emms for this previously but reports she did not hear back from him as was the plan.  Denies SOB, N/V, diaphoresis.  Still having some pain under L breast, particularly w/ deep breath.  Pt tearful when talking about husband's recent death.  Angry- 'why now?  Things were getting better'  Has not taken meds prescribed by the ER- 'i don't want something to change how i feel'.  Has recently started eating again after dropping to 118 lbs.  Sleeping well.   Review of Systems For ROS see HPI     Objective:   Physical Exam  Vitals reviewed. Constitutional: She is oriented to person, place, and time. She appears well-developed and well-nourished. No distress.  HENT:  Head: Normocephalic and atraumatic.  Eyes: Conjunctivae and EOM are normal. Pupils are equal, round, and reactive to light.  Neck: Normal range of motion. Neck supple. No thyromegaly present.  Cardiovascular: Normal rate, regular rhythm, normal heart sounds and intact distal pulses.   No murmur heard. Pulmonary/Chest: Effort normal and breath sounds normal. No respiratory distress.  Abdominal: Soft. She exhibits no distension. There is no tenderness.  Musculoskeletal: She exhibits no edema.  Lymphadenopathy:    She has no cervical adenopathy.  Neurological: She is alert and oriented to person, place, and time.  Skin: Skin is warm and dry.  Psychiatric: She has a normal mood and affect. Her behavior is normal. Judgment and thought content normal.       Intermittently tearful, angry.  Rambling.  Just wants to talk.          Assessment & Plan:

## 2012-01-11 NOTE — Assessment & Plan Note (Signed)
New.  Due to husband's recent and sudden death.  No need for meds at this time, rxn is normal and appropriate.  # given for hospice for grief counseling.  Total time spent w/ pt, >30 minutes, more than 50% spent counseling.

## 2012-01-11 NOTE — Patient Instructions (Signed)
Schedule a follow up in 3-4 weeks to recheck mood We'll call you with your cardiology appt Call Hospice for grief counseling- [phone]  Call with any questions or concerns Hang in there!!  You are stronger than you give yourself credit for!

## 2012-01-11 NOTE — Assessment & Plan Note (Signed)
New to provider.  Agree w/ ER that this is most likely stress related but due to risk factors of smoking, underlying lung dz and RBBB will refer to Dr Dory Peru [facility] Cards) for complete evaluation.

## 2012-02-07 ENCOUNTER — Ambulatory Visit: Payer: Medicare Other | Admitting: Family Medicine

## 2012-02-07 DIAGNOSIS — Z0289 Encounter for other administrative examinations: Secondary | ICD-10-CM

## 2012-02-23 ENCOUNTER — Ambulatory Visit (INDEPENDENT_AMBULATORY_CARE_PROVIDER_SITE_OTHER): Payer: Medicare Other | Admitting: Family Medicine

## 2012-02-23 ENCOUNTER — Encounter: Payer: Self-pay | Admitting: Family Medicine

## 2012-02-23 VITALS — BP 140/92 | HR 105 | Temp 97.9°F | Ht 66.75 in | Wt 121.8 lb

## 2012-02-23 DIAGNOSIS — IMO0001 Reserved for inherently not codable concepts without codable children: Secondary | ICD-10-CM

## 2012-02-23 DIAGNOSIS — R079 Chest pain, unspecified: Secondary | ICD-10-CM

## 2012-02-23 DIAGNOSIS — R030 Elevated blood-pressure reading, without diagnosis of hypertension: Secondary | ICD-10-CM

## 2012-02-23 DIAGNOSIS — F4323 Adjustment disorder with mixed anxiety and depressed mood: Secondary | ICD-10-CM

## 2012-02-23 HISTORY — DX: Reserved for inherently not codable concepts without codable children: IMO0001

## 2012-02-23 MED ORDER — NEBIVOLOL HCL 5 MG PO TABS: 5.0000 mg | ORAL_TABLET | Freq: Every day | ORAL | Status: DC

## 2012-02-23 MED ORDER — MOMETASONE FUROATE 50 MCG/ACT NA SUSP: 2.0000 | NASAL | Status: DC | PRN
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
## 2012-02-23 NOTE — Patient Instructions (Signed)
Follow up in 1 month to recheck blood pressure Start the Bystolic- 1 tab daily to improve BP, racing heart, and some anxiety Start the nasal spray- 2 sprays each nostril Call with any questions or concerns Hang in there!  You're doing great!

## 2012-02-28 NOTE — Assessment & Plan Note (Signed)
New.  Likely a consequence of pt's increased stress level but due to mildly reduced EF should start beta blocker.  Due to COPD will start low dose bystolic.  Reviewed supportive care and red flags that should prompt return.  Pt expressed understanding and is in agreement w/ plan.

## 2012-02-28 NOTE — Assessment & Plan Note (Signed)
Unchanged.  Pt having difficult time w/ 'logistics' of husband's passing.  Has not had time to deal w/ emotional loss.  Will continue to follow.

## 2012-02-28 NOTE — Assessment & Plan Note (Signed)
CP has currently resolved.  Saw Cards and had ECHO which showed mildly decreased EF.  Has been cleared for 6 months.  Will follow along and assist as able.

## 2012-03-09 ENCOUNTER — Encounter: Payer: Self-pay | Admitting: Family Medicine

## 2012-03-26 ENCOUNTER — Encounter: Payer: Self-pay | Admitting: Family Medicine

## 2012-03-26 ENCOUNTER — Ambulatory Visit (INDEPENDENT_AMBULATORY_CARE_PROVIDER_SITE_OTHER): Payer: Medicare Other | Admitting: Family Medicine

## 2012-03-26 VITALS — BP 133/82 | HR 100 | Temp 97.5°F | Ht 66.75 in | Wt 123.4 lb

## 2012-03-26 DIAGNOSIS — R030 Elevated blood-pressure reading, without diagnosis of hypertension: Secondary | ICD-10-CM

## 2012-03-26 DIAGNOSIS — F4323 Adjustment disorder with mixed anxiety and depressed mood: Secondary | ICD-10-CM

## 2012-03-26 NOTE — Assessment & Plan Note (Signed)
Unchanged.  Somewhat improved from last visit but not taking meds regularly.  Encouraged her to take Bystolic daily- additional samples provided.  Will continue to follow BP closely.

## 2012-03-26 NOTE — Assessment & Plan Note (Signed)
Improved.  Pt seems brighter today despite losing her brother 2 weeks ago.  Seems to be processing her losses and doing well.  Will continue to follow.

## 2012-03-26 NOTE — Patient Instructions (Addendum)
Follow up in 4-6 weeks to recheck BP Make sure you are taking the Bystolic daily Call with any questions or concerns Hang in there! I'm sorry for your loss!

## 2012-03-26 NOTE — Progress Notes (Signed)
  Subjective:    Patient ID: Jenna Marquez, female    DOB: 04/03/1946, 66 y.o.   MRN: [id number]  HPI HTN- new for pt, started on Bystolic daily at last visit but is not taking them regularly.  Denies CP, HAs, visual changes, edema.    Adjustment disorder- last brother died 2 weeks ago.  They were extremely close.  Husband died in 2023/01/13.  Pt recently got back from relaxing in [place].   Review of Systems For ROS see HPI     Objective:   Physical Exam  Vitals reviewed. Constitutional: She is oriented to person, place, and time. She appears well-developed and well-nourished. No distress.  HENT:  Head: Normocephalic and atraumatic.  Eyes: Conjunctivae and EOM are normal. Pupils are equal, round, and reactive to light.  Neck: Normal range of motion. Neck supple. No thyromegaly present.  Cardiovascular: Normal rate, regular rhythm, normal heart sounds and intact distal pulses.   No murmur heard. Pulmonary/Chest: Effort normal and breath sounds normal. No respiratory distress.  Abdominal: Soft. She exhibits no distension. There is no tenderness.  Musculoskeletal: She exhibits no edema.  Lymphadenopathy:    She has no cervical adenopathy.  Neurological: She is alert and oriented to person, place, and time.  Skin: Skin is warm and dry.  Psychiatric: She has a normal mood and affect. Her behavior is normal.          Assessment & Plan:

## 2012-04-02 ENCOUNTER — Ambulatory Visit (INDEPENDENT_AMBULATORY_CARE_PROVIDER_SITE_OTHER): Payer: Medicare Other | Admitting: Critical Care Medicine

## 2012-04-02 ENCOUNTER — Encounter: Payer: Self-pay | Admitting: Critical Care Medicine

## 2012-04-02 VITALS — BP 160/92 | HR 109 | Temp 98.4°F | Ht 66.5 in | Wt 125.0 lb

## 2012-04-02 DIAGNOSIS — Z720 Tobacco use: Secondary | ICD-10-CM

## 2012-04-02 DIAGNOSIS — F17200 Nicotine dependence, unspecified, uncomplicated: Secondary | ICD-10-CM

## 2012-04-02 DIAGNOSIS — J449 Chronic obstructive pulmonary disease, unspecified: Secondary | ICD-10-CM

## 2012-04-02 NOTE — Assessment & Plan Note (Addendum)
CAT 8 04/02/2012 COPD [facility] and ongoing tobacco use exacerbated by recent death of pts husband  Plan Pt advised to obtain grief counseling Pt given 3-16min smoking cessation counseling Pt to attend smoking cessation classes at [facility] No change in inhaled or maintenance medications. Return in  4 months

## 2012-04-02 NOTE — Patient Instructions (Signed)
No change in medications. Return in         4 months 

## 2012-04-02 NOTE — Progress Notes (Signed)
Subjective:    Patient ID: Jenna Marquez, female    DOB: [birth year], 66 y.o.   MRN: [id number] 66 y.o. A[facility] referred for COPD AB HPI Comments: Prior [place] pt.  Dr Beaulah Dinning referred for second opinion for Copd Dx Copd by [facility] 01/23/09.    Dx with asthma as a child.  Environmental allergies per [place]: trees, mold, dust pollen, never Rx allergy shots  11/29 Cont advair/spiriva Doxycycline for bronchitis flare x 7days Use nicotrol for smoking cessation >44min smoking cessation given to the patient  Now down to 1-2 cig per day.    Now some am cough, mucus is clear.  No real chest pain.  No real wheeze.  No edema in feet.  Sleeps through night ok Pt denies any significant sore throat, nasal congestion or excess secretions, fever, chills, sweats, unintended weight loss, pleurtic or exertional chest pain, orthopnea PND, or leg swelling Pt denies any increase in rescue therapy over baseline, denies waking up needing it or having any early am or nocturnal exacerbations of coughing/wheezing/or dyspnea. Pt also denies any obvious fluctuation in symptoms with  weather or environmental change or other alleviating or aggravating factors  10/2011 Last seen 11/29 and no changes made Hx of copd ongoing tobacco use Pt now notes sl better.  Spring is worse for the patient. Now on e cigarettes.   Some mucus in the am.  Not using rescue inhaler as before  04/02/2012 Pt still smoking a lot.  Pt husband just passed away 01-24-12. Pt having difficulty with this. Pt with ongoing cough.  Hx of Copd.   Pt denies any significant sore throat, nasal congestion or excess secretions, fever, chills, sweats, unintended weight loss, pleurtic or exertional chest pain, orthopnea PND, or leg swelling Pt denies any increase in rescue therapy over baseline, denies waking up needing it or having any early am or nocturnal exacerbations of coughing/wheezing/or dyspnea. Pt also denies any obvious fluctuation in symptoms with   weather or environmental change or other alleviating or aggravating factors    Past Medical History  Diagnosis Date  . COPD (chronic obstructive pulmonary disease)   . Tobacco abuse   . Asthma   . Allergic rhinitis   . Arthritis      Family History  Problem Relation Age of Onset  . Coronary artery disease Mother   . Heart disease Mother     CHF  . Diabetes Neg Hx   . Cancer Neg Hx   . Stroke Neg Hx      History   Social History  . Marital Status: Married    Spouse Name: N/A    Number of Children: 1  . Years of Education: N/A   Occupational History  . Retired [employer]   Social History Main Topics  . Smoking status: Current Everyday Smoker -- 0.3 packs/day    Types: Cigarettes    Last Attempt to Quit: 10/18/2011  . Smokeless tobacco: Never Used   Comment: Started at age 11  . Alcohol Use: Yes     socially  . Drug Use: No  . Sexually Active: Not on file   Other Topics Concern  . Not on file   Social History Narrative  . No narrative on file     Allergies  Allergen Reactions  . Avelox (Moxifloxacin Hcl In Nacl)      Outpatient Prescriptions Prior to Visit  Medication Sig Dispense Refill  . albuterol (PROVENTIL HFA) 108 (90 BASE) MCG/ACT inhaler  Inhale 2 puffs into the lungs every 6 (six) hours as needed.  1 Inhaler  5  . albuterol (PROVENTIL) (2.5 MG/3ML) 0.083% nebulizer solution Take 2.5 mg by nebulization every 6 (six) hours as needed.        . Calcium 500 MG CHEW Chew 1 tablet by mouth 2 (two) times daily.        . COD LIVER OIL PO Take by mouth 2 (two) times daily. Take tow capsules       . ergocalciferol (VITAMIN D2) 50000 UNITS capsule Take 50,000 Units by mouth every 30 (thirty) days.        . Fluticasone-Salmeterol (ADVAIR DISKUS) 250-50 MCG/DOSE AEPB Inhale 1 puff into the lungs every 12 (twelve) hours.  60 each  6  . ibuprofen (ADVIL,MOTRIN) 800 MG tablet Take 1 tablet (800 mg total) by mouth every 8 (eight) hours as  needed.  60 tablet  1  . nebivolol (BYSTOLIC) 5 MG tablet Take 1 tablet (5 mg total) by mouth daily.  30 tablet  3  . tiotropium (SPIRIVA HANDIHALER) 18 MCG inhalation capsule Place 1 capsule (18 mcg total) into inhaler and inhale daily.  30 capsule  6  . mometasone (NASONEX) 50 MCG/ACT nasal spray Place 2 sprays into the nose as needed.  17 g  3       Review of Systems  Constitutional: Positive for unexpected weight change. Negative for chills, diaphoresis, activity change, appetite change and fatigue.  HENT: Negative for hearing loss, nosebleeds, congestion, facial swelling, sneezing, mouth sores, trouble swallowing, neck stiffness, dental problem, voice change, postnasal drip, sinus pressure, tinnitus and ear discharge.   Eyes: Negative for photophobia, discharge, itching and visual disturbance.  Respiratory: Negative for apnea, cough, choking, chest tightness and stridor.   Cardiovascular: Negative for palpitations.  Gastrointestinal: Negative for nausea, constipation, blood in stool and abdominal distention.  Genitourinary: Negative for dysuria, urgency, frequency, hematuria, flank pain, decreased urine volume and difficulty urinating.  Musculoskeletal: Negative for myalgias, back pain, joint swelling, arthralgias and gait problem.  Skin: Negative for color change and pallor.  Neurological: Negative for dizziness, tremors, seizures, syncope, speech difficulty, weakness, light-headedness and numbness.  Hematological: Negative for adenopathy. Does not bruise/bleed easily.  Psychiatric/Behavioral: Negative for confusion, disturbed wake/sleep cycle and agitation. The patient is not nervous/anxious.        Objective:   Physical Exam  Filed Vitals:   04/02/12 1428  BP: 160/92  Pulse: 109  Temp: 98.4 F (36.9 C)  TempSrc: Oral  Height: 5' 6.5" (1.689 m)  Weight: 125 lb (56.7 kg)  SpO2: 94%    Gen: Pleasant, well-nourished, in no distress,  normal affect  ENT: No lesions,   mouth clear,  oropharynx clear, no postnasal drip  Neck: No JVD, no TMG, no carotid bruits  Lungs: No use of accessory muscles, no dullness to percussion, distant BS, poor airflow, few exp wheezes  Cardiovascular: RRR, heart sounds normal, no murmur or gallops, no peripheral edema  Abdomen: soft and NT, no HSM,  BS normal  Musculoskeletal: No deformities, no cyanosis or clubbing  Neuro: alert, non focal  Skin: Warm, no lesions or rashes    CXR 8/12>>> Cleda Daub 2/12: FeV1 49% Fef 25 75 22%  09/2010    Assessment & Plan:   COPD (chronic obstructive pulmonary disease) CAT 8 04/02/2012 COPD Golds C and ongoing tobacco use exacerbated by recent death of pts husband  Plan Pt advised to obtain grief counseling Pt given 3-42min smoking cessation counseling Pt  to attend smoking cessation classes at [facility] No change in inhaled or maintenance medications. Return in  4 months     Updated Medication List Outpatient Encounter Prescriptions as of 04/02/2012  Medication Sig Dispense Refill  . albuterol (PROVENTIL HFA) 108 (90 BASE) MCG/ACT inhaler Inhale 2 puffs into the lungs every 6 (six) hours as needed.  1 Inhaler  5  . albuterol (PROVENTIL) (2.5 MG/3ML) 0.083% nebulizer solution Take 2.5 mg by nebulization every 6 (six) hours as needed.        . Calcium 500 MG CHEW Chew 1 tablet by mouth 2 (two) times daily.        . COD LIVER OIL PO Take by mouth 2 (two) times daily. Take tow capsules       . ergocalciferol (VITAMIN D2) 50000 UNITS capsule Take 50,000 Units by mouth every 30 (thirty) days.        . Fluticasone-Salmeterol (ADVAIR DISKUS) 250-50 MCG/DOSE AEPB Inhale 1 puff into the lungs every 12 (twelve) hours.  60 each  6  . ibuprofen (ADVIL,MOTRIN) 800 MG tablet Take 1 tablet (800 mg total) by mouth every 8 (eight) hours as needed.  60 tablet  1  . mometasone (NASONEX) 50 MCG/ACT nasal spray Place 2 sprays into the nose at bedtime.      . nebivolol (BYSTOLIC) 5 MG  tablet Take 1 tablet (5 mg total) by mouth daily.  30 tablet  3  . tiotropium (SPIRIVA HANDIHALER) 18 MCG inhalation capsule Place 1 capsule (18 mcg total) into inhaler and inhale daily.  30 capsule  6  . DISCONTD: mometasone (NASONEX) 50 MCG/ACT nasal spray Place 2 sprays into the nose as needed.  17 g  3

## 2012-04-11 ENCOUNTER — Telehealth: Payer: Self-pay | Admitting: Critical Care Medicine

## 2012-04-11 NOTE — Telephone Encounter (Signed)
LMTCB

## 2012-04-12 MED ORDER — ALBUTEROL SULFATE (2.5 MG/3ML) 0.083% IN NEBU: 2.5000 mg | INHALATION_SOLUTION | Freq: Four times a day (QID) | RESPIRATORY_TRACT | Status: DC | PRN

## 2012-04-12 NOTE — Telephone Encounter (Signed)
I called that pharmacy and spoke with the pt and what was needed was a new RX for albuterol because original rx was written by Dr. Beaulah Dinning and pt no longer see him. Also the pt wanted the rx to be written for a smaller quantity  Because she is in the donut hole and does not need 6 boxes at a time like before. Rx sent. Pt is aware.Carron Curie, CMA

## 2012-04-23 ENCOUNTER — Encounter: Payer: Self-pay | Admitting: Family Medicine

## 2012-04-23 ENCOUNTER — Ambulatory Visit (INDEPENDENT_AMBULATORY_CARE_PROVIDER_SITE_OTHER): Payer: Medicare Other | Admitting: Family Medicine

## 2012-04-23 VITALS — BP 128/80 | HR 77 | Temp 97.6°F | Ht 66.75 in | Wt 123.2 lb

## 2012-04-23 DIAGNOSIS — R030 Elevated blood-pressure reading, without diagnosis of hypertension: Secondary | ICD-10-CM

## 2012-04-23 DIAGNOSIS — K30 Functional dyspepsia: Secondary | ICD-10-CM

## 2012-04-23 DIAGNOSIS — K3189 Other diseases of stomach and duodenum: Secondary | ICD-10-CM

## 2012-04-23 HISTORY — DX: Functional dyspepsia: K30

## 2012-04-23 MED ORDER — GI COCKTAIL ~~LOC~~
30.0000 mL | Freq: Once | ORAL | Status: AC
  Administered 2012-04-23: 30 mL via ORAL

## 2012-04-23 NOTE — Progress Notes (Signed)
  Subjective:    Patient ID: Jenna Marquez, female    DOB: 1945/11/24, 66 y.o.   MRN: [id number]  HPI Elevated BP- improved today since starting Bystolic.  Now taking med regularly.  Denies CP, SOB, HAs, visual changes, edema  Indigestion- pt reports R sided gas pain, some nausea.  Reports some tightness under R breast.  + PND.   Review of Systems For ROS see HPI     Objective:   Physical Exam  Vitals reviewed. Constitutional: She is oriented to person, place, and time. She appears well-developed and well-nourished. No distress.  HENT:  Head: Normocephalic and atraumatic.  Eyes: Conjunctivae and EOM are normal. Pupils are equal, round, and reactive to light.  Neck: Normal range of motion. Neck supple. No thyromegaly present.  Cardiovascular: Normal rate, regular rhythm, normal heart sounds and intact distal pulses.   No murmur heard. Pulmonary/Chest: Effort normal and breath sounds normal. No respiratory distress.  Abdominal: Soft. She exhibits no distension. There is no tenderness. There is no rebound and no guarding.  Musculoskeletal: She exhibits no edema.  Lymphadenopathy:    She has no cervical adenopathy.  Neurological: She is alert and oriented to person, place, and time.  Skin: Skin is warm and dry.  Psychiatric: She has a normal mood and affect. Her behavior is normal.          Assessment & Plan:

## 2012-04-23 NOTE — Patient Instructions (Addendum)
Follow up in 3 months to recheck BP and see how the quitting is going Continue the Bystolic daily Take Tums as needed- if your heart burn worsens or doesn't improve, CALL ME! Call with any questions or concerns Hang in there!!!

## 2012-04-24 NOTE — Assessment & Plan Note (Signed)
New.  Pt's chest pain improved after GI cocktail in office.  Reviewed OTC meds and lifestyle changes that will improve sxs.  Pt expressed understanding and is in agreement w/ plan.

## 2012-04-24 NOTE — Assessment & Plan Note (Signed)
Better today since taking Bystolic regularly.  Currently asymptomatic.  Will continue to follow at future visits as pt plans to start smoking cessation program this month.

## 2012-04-25 ENCOUNTER — Emergency Department (HOSPITAL_BASED_OUTPATIENT_CLINIC_OR_DEPARTMENT_OTHER)
Admission: EM | Admit: 2012-04-25 | Discharge: 2012-04-25 | Disposition: A | Discharge status: Home / Self Care | Payer: Medicare Other | Attending: Emergency Medicine | Admitting: Emergency Medicine

## 2012-04-25 ENCOUNTER — Encounter (HOSPITAL_BASED_OUTPATIENT_CLINIC_OR_DEPARTMENT_OTHER): Payer: Self-pay | Admitting: *Deleted

## 2012-04-25 DIAGNOSIS — Z888 Allergy status to other drugs, medicaments and biological substances status: Secondary | ICD-10-CM | POA: Insufficient documentation

## 2012-04-25 DIAGNOSIS — J45909 Unspecified asthma, uncomplicated: Secondary | ICD-10-CM | POA: Insufficient documentation

## 2012-04-25 DIAGNOSIS — Z833 Family history of diabetes mellitus: Secondary | ICD-10-CM | POA: Insufficient documentation

## 2012-04-25 DIAGNOSIS — Z809 Family history of malignant neoplasm, unspecified: Secondary | ICD-10-CM | POA: Insufficient documentation

## 2012-04-25 DIAGNOSIS — Z823 Family history of stroke: Secondary | ICD-10-CM | POA: Insufficient documentation

## 2012-04-25 DIAGNOSIS — Z8249 Family history of ischemic heart disease and other diseases of the circulatory system: Secondary | ICD-10-CM | POA: Insufficient documentation

## 2012-04-25 DIAGNOSIS — F17200 Nicotine dependence, unspecified, uncomplicated: Secondary | ICD-10-CM | POA: Insufficient documentation

## 2012-04-25 DIAGNOSIS — H6590 Unspecified nonsuppurative otitis media, unspecified ear: Secondary | ICD-10-CM | POA: Insufficient documentation

## 2012-04-25 MED ORDER — AMOXICILLIN 500 MG PO CAPS: 500.0000 mg | ORAL_CAPSULE | Freq: Three times a day (TID) | ORAL | Status: AC

## 2012-04-25 NOTE — ED Notes (Signed)
Pt amb to triage with quick steady gait smiling in nad. Pt reports one hour ago she started hearing "the ocean" in her left ear. "Like I'm in a vacuum", denies ha or any ear pain, "just fullness".

## 2012-04-25 NOTE — ED Provider Notes (Signed)
History     CSN: [id number]  Arrival date & time 04/25/12  1726   First MD Initiated Contact with Patient 04/25/12 1743      Chief Complaint  Patient presents with  . Ear Fullness     Patient is a 66 y.o. female presenting with plugged ear sensation. The history is provided by the patient.  Ear Fullness This is a new problem. The current episode started 1 to 2 hours ago. The problem occurs constantly. The problem has not changed since onset.Pertinent negatives include no chest pain and no headaches. Nothing aggravates the symptoms. Nothing relieves the symptoms. She has tried nothing for the symptoms.  pt reports she had onset of bilateral ear fullness, now with just left sided ear fullness No headache.  No pain.  No ear drainage.  No trauma.  No visual changes.  No weakness reported.  No h/o ear surgery No recent trauma/fall.  No dizziness is reported.  No h/o CVA  Past Medical History  Diagnosis Date  . COPD (chronic obstructive pulmonary disease)   . Tobacco abuse   . Asthma   . Allergic rhinitis   . Arthritis     Past Surgical History  Procedure Date  . Ectopic pregnancy surgery   . Tonsillectomy and adenoidectomy at age 52    Family History  Problem Relation Age of Onset  . Coronary artery disease Mother   . Heart disease Mother     CHF  . Diabetes Neg Hx   . Cancer Neg Hx   . Stroke Neg Hx     History  Substance Use Topics  . Smoking status: Current Every Day Smoker -- 0.3 packs/day    Types: Cigarettes    Last Attempt to Quit: 10/18/2011  . Smokeless tobacco: Never Used   Comment: Started at age 68  . Alcohol Use: Yes     socially    OB History    Grav Para Term Preterm Abortions TAB SAB Ect Mult Living                  Review of Systems  Cardiovascular: Negative for chest pain.  Neurological: Negative for headaches.    Allergies  Avelox  Home Medications   Current Outpatient Rx  Name Route Sig Dispense Refill  . ALBUTEROL SULFATE HFA  108 (90 BASE) MCG/ACT IN AERS Inhalation Inhale 2 puffs into the lungs every 6 (six) hours as needed. 1 Inhaler 5  . ALBUTEROL SULFATE (2.5 MG/3ML) 0.083% IN NEBU Nebulization Take 3 mLs (2.5 mg total) by nebulization every 6 (six) hours as needed. DX 496 75 mL 6  . AMOXICILLIN 500 MG PO CAPS Oral Take 1 capsule (500 mg total) by mouth 3 (three) times daily. 21 capsule 0  . CALCIUM 500 MG PO CHEW Oral Chew 1 tablet by mouth 2 (two) times daily.      . COD LIVER OIL PO Oral Take by mouth 2 (two) times daily. Take tow capsules     . ERGOCALCIFEROL 50000 UNITS PO CAPS Oral Take 50,000 Units by mouth every 30 (thirty) days.      Marland Kitchen FLUTICASONE-SALMETEROL 250-50 MCG/DOSE IN AEPB Inhalation Inhale 1 puff into the lungs every 12 (twelve) hours. 60 each 6  . IBUPROFEN 800 MG PO TABS Oral Take 1 tablet (800 mg total) by mouth every 8 (eight) hours as needed. 60 tablet 1  . MOMETASONE FUROATE 50 MCG/ACT NA SUSP Nasal Place 2 sprays into the nose at bedtime.    Marland Kitchen  NEBIVOLOL HCL 5 MG PO TABS Oral Take 1 tablet (5 mg total) by mouth daily. 30 tablet 3  . TIOTROPIUM BROMIDE MONOHYDRATE 18 MCG IN CAPS Inhalation Place 1 capsule (18 mcg total) into inhaler and inhale daily. 30 capsule 6    BP 128/67  Pulse 80  Temp 98 F (36.7 C) (Oral)  Resp 18  Ht 5\' 6"  (1.676 m)  Wt 123 lb (55.792 kg)  BMI 19.85 kg/m2  SpO2 97%  Physical Exam CONSTITUTIONAL: Well developed/well nourished HEAD AND FACE: Normocephalic/atraumatic EYES: EOMI/PERRL ENMT: Mucous membranes moist.  Left TM intact.  There is erythema and evidence of effusion.  There is no blood noted in the canal.  The ears are symmetric.   NECK: supple no meningeal signs, no bruits CV: S1/S2 noted, no murmurs/rubs/gallops noted LUNGS: Lungs are clear to auscultation bilaterally, no apparent distress ABDOMEN: soft, nontender, no rebound or guarding NEURO: Awake/alert, facies symmetric, no arm or leg drift is noted Cranial nerves 3/4/5/6/02/20/09/11/12 tested  and intact Gait normal No past pointing EXTREMITIES: pulses normal, full ROM SKIN: warm, color normal PSYCH: no abnormalities of mood noted  ED Course  Procedures    1. Otitis media with effusion       MDM  Nursing notes including past medical history and social history reviewed and considered in documentation         Joya Gaskins, MD 04/25/12 1801

## 2012-05-22 ENCOUNTER — Emergency Department (HOSPITAL_BASED_OUTPATIENT_CLINIC_OR_DEPARTMENT_OTHER): Payer: Medicare Other

## 2012-05-22 ENCOUNTER — Encounter (HOSPITAL_BASED_OUTPATIENT_CLINIC_OR_DEPARTMENT_OTHER): Payer: Self-pay | Admitting: *Deleted

## 2012-05-22 ENCOUNTER — Emergency Department (HOSPITAL_BASED_OUTPATIENT_CLINIC_OR_DEPARTMENT_OTHER)
Admission: EM | Admit: 2012-05-22 | Discharge: 2012-05-23 | Disposition: A | Discharge status: Home / Self Care | Payer: Medicare Other | Attending: Emergency Medicine | Admitting: Emergency Medicine

## 2012-05-22 DIAGNOSIS — J4489 Other specified chronic obstructive pulmonary disease: Secondary | ICD-10-CM | POA: Insufficient documentation

## 2012-05-22 DIAGNOSIS — Z79899 Other long term (current) drug therapy: Secondary | ICD-10-CM | POA: Insufficient documentation

## 2012-05-22 DIAGNOSIS — K5900 Constipation, unspecified: Secondary | ICD-10-CM | POA: Insufficient documentation

## 2012-05-22 DIAGNOSIS — J449 Chronic obstructive pulmonary disease, unspecified: Secondary | ICD-10-CM | POA: Insufficient documentation

## 2012-05-22 DIAGNOSIS — R109 Unspecified abdominal pain: Secondary | ICD-10-CM

## 2012-05-22 LAB — URINALYSIS, ROUTINE W REFLEX MICROSCOPIC
Glucose, UA: NEGATIVE mg/dL
Hgb urine dipstick: NEGATIVE
Ketones, ur: 15 mg/dL — AB
Protein, ur: NEGATIVE mg/dL

## 2012-05-22 LAB — COMPREHENSIVE METABOLIC PANEL
Albumin: 3.5 g/dL (ref 3.5–5.2)
BUN: 18 mg/dL (ref 6–23)
Calcium: 8.9 mg/dL (ref 8.4–10.5)
Creatinine, Ser: 1 mg/dL (ref 0.50–1.10)
GFR calc Af Amer: 66 mL/min — ABNORMAL LOW (ref >90)
Glucose, Bld: 156 mg/dL — ABNORMAL HIGH (ref 70–99)
Total Protein: 6.7 g/dL (ref 6.0–8.3)

## 2012-05-22 LAB — CBC
HCT: 38.8 % (ref 36.0–46.0)
Hemoglobin: 13.5 g/dL (ref 12.0–15.0)
MCH: 35 pg — ABNORMAL HIGH (ref 26.0–34.0)
MCHC: 34.8 g/dL (ref 30.0–36.0)
MCV: 100.5 fL — ABNORMAL HIGH (ref 78.0–100.0)
RDW: 12.6 % (ref 11.5–15.5)

## 2012-05-22 LAB — URINE MICROSCOPIC-ADD ON

## 2012-05-22 LAB — LIPASE, BLOOD: Lipase: 22 U/L (ref 11–59)

## 2012-05-22 MED ORDER — IOHEXOL 300 MG/ML  SOLN
20.0000 mL | Freq: Once | INTRAMUSCULAR | Status: AC | PRN
  Administered 2012-05-22: 20 mL via ORAL

## 2012-05-22 MED ORDER — POLYETHYLENE GLYCOL 3350 17 GM/SCOOP PO POWD: 17.0000 g | Freq: Every day | ORAL | Status: DC

## 2012-05-22 MED ORDER — MORPHINE SULFATE 4 MG/ML IJ SOLN
4.0000 mg | Freq: Once | INTRAMUSCULAR | Status: AC
  Administered 2012-05-22: 4 mg via INTRAVENOUS
  Filled 2012-05-22: qty 1

## 2012-05-22 MED ORDER — ONDANSETRON HCL 4 MG/2ML IJ SOLN
4.0000 mg | Freq: Once | INTRAMUSCULAR | Status: AC
  Administered 2012-05-22: 4 mg via INTRAVENOUS
  Filled 2012-05-22: qty 2

## 2012-05-22 MED ORDER — DISPOSABLE ENEMA 19-7 GM/118ML RE ENEM: 1.0000 | ENEMA | Freq: Once | RECTAL | Status: DC

## 2012-05-22 MED ORDER — IOHEXOL 300 MG/ML  SOLN
100.0000 mL | Freq: Once | INTRAMUSCULAR | Status: AC | PRN
  Administered 2012-05-22: 100 mL via INTRAVENOUS

## 2012-05-22 NOTE — ED Provider Notes (Signed)
History     CSN: [id number]  Arrival date & time 05/22/12  2112   First MD Initiated Contact with Patient 05/22/12 2149      Chief Complaint  Patient presents with  . Abdominal Pain    (Consider location/radiation/quality/duration/timing/severity/associated sxs/prior treatment) HPI Pt presents with c/o lower abdominal pain.  Pt states pain began several hours ago and has been constant and sharp.  She states it is across her entire lower abdomen.  She feels as if she needs to have  Bowel movement.  No fever.  She has vomited x 3 tonight.  She states she feels that symptoms may be due to eating red onions earlier in the day.  Denies dysuria.  Last BM was yesterday and normal for her.  There are no other associated systemic symptoms, there are no other alleviating or modifying factors.   Past Medical History  Diagnosis Date  . COPD (chronic obstructive pulmonary disease)   . Tobacco abuse   . Asthma   . Allergic rhinitis     Past Surgical History  Procedure Date  . Ectopic pregnancy surgery   . Tonsillectomy and adenoidectomy at age 22    Family History  Problem Relation Age of Onset  . Coronary artery disease Mother   . Heart disease Mother     CHF  . Diabetes Neg Hx   . Cancer Neg Hx   . Stroke Neg Hx     History  Substance Use Topics  . Smoking status: Current Every Day Smoker -- 0.3 packs/day    Types: Cigarettes    Last Attempt to Quit: 10/18/2011  . Smokeless tobacco: Never Used   Comment: Started at age 60  . Alcohol Use: 12.6 oz/week    21 Glasses of wine per week     socially    OB History    Grav Para Term Preterm Abortions TAB SAB Ect Mult Living                  Review of Systems ROS reviewed and all otherwise negative except for mentioned in HPI  Allergies  Avelox  Home Medications   Current Outpatient Rx  Name Route Sig Dispense Refill  . ALBUTEROL SULFATE HFA 108 (90 BASE) MCG/ACT IN AERS Inhalation Inhale 2 puffs into the lungs every 6  (six) hours as needed. 1 Inhaler 5  . ALBUTEROL SULFATE (2.5 MG/3ML) 0.083% IN NEBU Nebulization Take 3 mLs (2.5 mg total) by nebulization every 6 (six) hours as needed. DX 496 75 mL 6  . CALCIUM 500 MG PO CHEW Oral Chew 1 tablet by mouth 2 (two) times daily.      . COD LIVER OIL PO Oral Take by mouth 2 (two) times daily. Take tow capsules     . ERGOCALCIFEROL 50000 UNITS PO CAPS Oral Take 50,000 Units by mouth every 30 (thirty) days.      Marland Kitchen FLUTICASONE-SALMETEROL 250-50 MCG/DOSE IN AEPB Inhalation Inhale 1 puff into the lungs every 12 (twelve) hours. 60 each 6  . IBUPROFEN 800 MG PO TABS Oral Take 1 tablet (800 mg total) by mouth every 8 (eight) hours as needed. 60 tablet 1  . MOMETASONE FUROATE 50 MCG/ACT NA SUSP Nasal Place 2 sprays into the nose at bedtime.    . NEBIVOLOL HCL 5 MG PO TABS Oral Take 1 tablet (5 mg total) by mouth daily. 30 tablet 3  . POLYETHYLENE GLYCOL 3350 PO POWD Oral Take 17 g by mouth daily. 255 g 0  .  DISPOSABLE ENEMA 19-7 GM/118ML RE ENEM Rectal Place 1 enema rectally once. follow package directions 135 mL 0  . TIOTROPIUM BROMIDE MONOHYDRATE 18 MCG IN CAPS Inhalation Place 1 capsule (18 mcg total) into inhaler and inhale daily. 30 capsule 6    BP 105/47  Pulse 78  Temp 98.5 F (36.9 C) (Oral)  Resp 18  Wt 124 lb (56.246 kg)  SpO2 98% Vitals reviewed Physical Exam Physical Examination: General appearance - alert, well appearing, and in no distress Mental status - alert, oriented to person, place, and time Eyes - pupils equal and reactive, extraocular eye movements intact Mouth - mucous membranes moist, pharynx normal without lesions Chest - clear to auscultation, no wheezes, rales or rhonchi, symmetric air entry Heart - normal rate, regular rhythm, normal S1, S2, no murmurs, rubs, clicks or gallops Abdomen - soft, bilateral lower abdominal tenderness to palpation, no gaurding or rebound, nondistended, no masses or organomegaly, nabs Extremities - peripheral  pulses normal, no pedal edema, no clubbing or cyanosis Skin - normal coloration and turgor, no rashes  ED Course  Procedures (including critical care time)  Labs Reviewed  URINALYSIS, ROUTINE W REFLEX MICROSCOPIC - Abnormal; Notable for the following:    Color, Urine AMBER (*)  BIOCHEMICALS MAY BE AFFECTED BY COLOR   APPearance CLOUDY (*)     Bilirubin Urine SMALL (*)     Ketones, ur 15 (*)     Leukocytes, UA MODERATE (*)     All other components within normal limits  CBC - Abnormal; Notable for the following:    RBC 3.86 (*)     MCV 100.5 (*)     MCH 35.0 (*)     All other components within normal limits  COMPREHENSIVE METABOLIC PANEL - Abnormal; Notable for the following:    Glucose, Bld 156 (*)     GFR calc non Af Amer 57 (*)     GFR calc Af Amer 66 (*)     All other components within normal limits  URINE MICROSCOPIC-ADD ON - Abnormal; Notable for the following:    Squamous Epithelial / LPF FEW (*)     All other components within normal limits  LIPASE, BLOOD  URINE CULTURE   Ct Abdomen Pelvis W Contrast  05/22/2012  *RADIOLOGY REPORT*  Clinical Data: 66 year old female with nausea vomiting epigastric pain constipation.  CT ABDOMEN AND PELVIS WITH CONTRAST  Technique:  Multidetector CT imaging of the abdomen and pelvis was performed following the standard protocol during bolus administration of intravenous contrast.  Contrast: OMNIPAQUE IOHEXOL 300 MG/ML  SOLN, 20mL OMNIPAQUE IOHEXOL 300 MG/ML  SOLN  Comparison: Chest radiographs 04/11/2011.  Findings: Mild elevation of the right hemidiaphragm.  Mild atelectasis and crowding of markings at the lung bases.  No pericardial or pleural effusion.  Mild scoliosis.  Degenerative changes in the spine.  Mild degenerative changes along the SI joints. No acute osseous abnormality identified.  Anteverted uterus with multiple calcified small fibroids.  No pelvic free fluid.  Retained stool throughout the distal colon. Bladder decompressed.   No pelvic free fluid.  Scattered diverticula in the distal colon.  Retained stool throughout.  Normal appendix. No definite focal large bowel inflammation.  No dilated small bowel loops.  There may be mucosal hyperenhancement in the mid and distal small bowel.  No mesenteric stranding.  Contrast in the stomach and duodenum which are otherwise unremarkable.  Liver and gallbladder within normal limits.  Spleen, pancreas and adrenal glands within normal limits.  Portal venous system within normal limits.  Extensive atherosclerosis of the abdominal aorta and its branches. Pronounced atherosclerosis of the iliac arteries.  Major arterial structures in the abdomen and pelvis remain patent.  No abdominal free fluid.  Kidneys within normal limits.  No lymphadenopathy.  IMPRESSION: 1.  Suggestion of some small bowel mucosal hyperenhancement such as due to nonspecific enteritis.  No bowel obstruction.  Normal appendix.  Retained stool throughout the colon. 2.  No other acute or inflammatory findings in the abdomen or pelvis. 3.  Advanced atherosclerosis.   Original Report Authenticated By: Ulla Potash III, M.D.      1. Abdominal pain   2. Constipation       MDM  Pt presenting with c/o lower abdominal pain with vomiting tonight.  Labs and CT scan reassuring.  CT scan obtained to r/o diverticulitis or other significant pathology.  Ct did show retained stool in colon.  Pt advised to do fleets enema as well as start on miralax.  She was feeling much improved after pain and nausea meds in ED.  Urine sent for culture as it was equivocal.          Ethelda Chick, MD 05/23/12 [id number]

## 2012-05-22 NOTE — ED Notes (Signed)
Pt reports lower abd pain since eating onions this afternoon- reports she feels like she needs to have a bowel movement- reports she vomited x3

## 2012-05-22 NOTE — ED Notes (Signed)
MD at bedside. 

## 2012-05-24 LAB — URINE CULTURE

## 2012-05-31 ENCOUNTER — Ambulatory Visit: Payer: Medicare Other

## 2012-06-05 ENCOUNTER — Ambulatory Visit: Payer: Medicare Other

## 2012-06-06 ENCOUNTER — Encounter: Payer: Self-pay | Admitting: Internal Medicine

## 2012-06-06 ENCOUNTER — Ambulatory Visit (INDEPENDENT_AMBULATORY_CARE_PROVIDER_SITE_OTHER): Payer: Medicare Other | Admitting: Internal Medicine

## 2012-06-06 ENCOUNTER — Telehealth: Payer: Self-pay | Admitting: Family Medicine

## 2012-06-06 VITALS — BP 120/78 | HR 92 | Temp 98.0°F | Wt 125.0 lb

## 2012-06-06 DIAGNOSIS — S0992XA Unspecified injury of nose, initial encounter: Secondary | ICD-10-CM

## 2012-06-06 DIAGNOSIS — S0993XA Unspecified injury of face, initial encounter: Secondary | ICD-10-CM

## 2012-06-06 HISTORY — DX: Unspecified injury of nose, initial encounter: S09.92XA

## 2012-06-06 NOTE — Telephone Encounter (Signed)
MD Beverely Low made aware of pt apt with Drue Novel today verbally

## 2012-06-06 NOTE — Assessment & Plan Note (Addendum)
Status post a mechanical fall, presents with nose injury and a deep excoriation there. Neurological exam is normal, no red flag symptoms, orbits without crepitus. Plan: Wound care discuss, see instructions X-ray F/u 1 week w/ PCP

## 2012-06-06 NOTE — Progress Notes (Signed)
  Subjective:    Patient ID: Jenna Marquez, female    DOB: [birth year], 66 y.o.   MRN: [id number]  HPI Acute visit 06/04/2012 the patient was at home, tripped and fell, landed on her face, had several excoriations. There was no loss of consciousness before or after, she bled temporarily from the nose. She is here for evaluation. Today for the first time noted ecchymoses under her eyes.   Past Medical History  Diagnosis Date  . COPD (chronic obstructive pulmonary disease)   . Tobacco abuse   . Asthma   . Allergic rhinitis    Past Surgical History  Procedure Date  . Ectopic pregnancy surgery   . Tonsillectomy and adenoidectomy at age 13      Review of Systems Besides actually  mild soreness at the area of impact, she denies headaches, nausea, vomiting, neck pain. Denies diplopia. No watery nasal d/c     Objective:   Physical Exam  Constitutional: She is oriented to person, place, and time. She appears well-developed and well-nourished. No distress.  HENT:  Head: Normocephalic.         Palpation of the orbits without  Instability or crepitus or  pain. Nose without deformities,  Tender palpation. No actual nose bleed at this time.  Eyes: EOM are normal. Pupils are equal, round, and reactive to light.  Neck: Normal range of motion. Neck supple.  Neurological: She is alert and oriented to person, place, and time. No cranial nerve deficit.  Skin: She is not diaphoretic.  Psychiatric: She has a normal mood and affect. Her behavior is normal. Judgment and thought content normal.          Assessment & Plan:

## 2012-06-06 NOTE — Patient Instructions (Addendum)
Get a  nose XR at [facility], corner of [address] (10 minutes form here); they are open 24/7 [address]  [place], [place] [address] [phone] ------ Keep the area clean with soap and water or peroxide Use an antibiotic ointment Cover with band-aids during the daytime, leave it open at night. Call anytime if you have severe pain, headache, double vision or a watery nasal discharge Please see your primary doctor in one week

## 2012-06-06 NOTE — Telephone Encounter (Signed)
Caller: Saniyyah/Patient; Patient Name: Jenna Marquez; PCP: Sheliah Hatch.; Best Callback Phone Number: [phone]; Reason for call: Cuts, Scrapes, Abrasions   06-04-12  fell on driveway she said she tripped, hitting face, has abrasions to forehead and nose.  06-06-12 today she noticed blood under eyes   She said it is like when you have bags under your eyes it is red and swollen like blood.  Not black at all.  All emergent sxs per Eye Injury ruled out except for bruising or mild swelling to orbit, cheek or forehead and no changes in vision   Appt made for 1430 today with Dr Drue Novel

## 2012-06-12 ENCOUNTER — Ambulatory Visit (HOSPITAL_BASED_OUTPATIENT_CLINIC_OR_DEPARTMENT_OTHER)
Admission: RE | Admit: 2012-06-12 | Discharge: 2012-06-12 | Disposition: A | Discharge status: Home / Self Care | Payer: Medicare Other | Source: Ambulatory Visit | Attending: Internal Medicine | Admitting: Internal Medicine

## 2012-06-12 ENCOUNTER — Telehealth: Payer: Self-pay | Admitting: Internal Medicine

## 2012-06-12 DIAGNOSIS — R52 Pain, unspecified: Secondary | ICD-10-CM

## 2012-06-12 DIAGNOSIS — M25559 Pain in unspecified hip: Secondary | ICD-10-CM | POA: Insufficient documentation

## 2012-06-12 NOTE — Telephone Encounter (Signed)
dup

## 2012-06-12 NOTE — Telephone Encounter (Signed)
Left hip xray entered.

## 2012-06-12 NOTE — Addendum Note (Signed)
Addended by: Edwena Felty T on: 06/12/2012 03:51 PM   Modules accepted: Orders

## 2012-06-12 NOTE — Telephone Encounter (Signed)
Patient states the current order we have for in our system is for a nasal bone xray.  Patient says the order should be a chest xray, which was on after visit summary.  Also, patient requesting an order be entered for a left hip xray.  Patient would like to have these performed at 3:30pm today, 06/12/12.

## 2012-06-12 NOTE — Telephone Encounter (Signed)
AVS corrected

## 2012-06-12 NOTE — Telephone Encounter (Signed)
Please advise 

## 2012-06-12 NOTE — Telephone Encounter (Signed)
Correction:  the AVS is wrong, I meant to say a nose x-ray. Does not need a chest x-ray. If she's having pain in the hip after she fell, okay to do a left hip x-ray, DX pain.

## 2012-06-13 ENCOUNTER — Ambulatory Visit (INDEPENDENT_AMBULATORY_CARE_PROVIDER_SITE_OTHER): Payer: Medicare Other | Admitting: Family Medicine

## 2012-06-13 ENCOUNTER — Encounter: Payer: Self-pay | Admitting: Family Medicine

## 2012-06-13 VITALS — BP 128/80 | HR 85 | Temp 97.7°F | Ht 66.5 in | Wt 127.8 lb

## 2012-06-13 DIAGNOSIS — M5416 Radiculopathy, lumbar region: Secondary | ICD-10-CM

## 2012-06-13 DIAGNOSIS — IMO0002 Reserved for concepts with insufficient information to code with codable children: Secondary | ICD-10-CM

## 2012-06-13 DIAGNOSIS — S199XXA Unspecified injury of neck, initial encounter: Secondary | ICD-10-CM

## 2012-06-13 DIAGNOSIS — S0993XA Unspecified injury of face, initial encounter: Secondary | ICD-10-CM

## 2012-06-13 DIAGNOSIS — S0992XA Unspecified injury of nose, initial encounter: Secondary | ICD-10-CM

## 2012-06-13 HISTORY — DX: Radiculopathy, lumbar region: M54.16

## 2012-06-13 MED ORDER — PREDNISONE 10 MG PO TABS: ORAL_TABLET | ORAL | Status: DC

## 2012-06-13 MED ORDER — CYCLOBENZAPRINE HCL 5 MG PO TABS: 5.0000 mg | ORAL_TABLET | Freq: Three times a day (TID) | ORAL | Status: DC | PRN

## 2012-06-13 NOTE — Assessment & Plan Note (Signed)
New.  Pt w/out hip pain- pain is actually L lumbar/paraspinal spasm w/ radiculopathy.  Start Pred taper, muscle relaxer, heat.  Refer to ortho.  Reviewed supportive care and red flags that should prompt return.  Pt expressed understanding and is in agreement w/ plan.

## 2012-06-13 NOTE — Patient Instructions (Addendum)
We'll call you with your ortho appt Start the Prednisone as directed- take w/ food No ibuprofen while on the Prednisone Tylenol as needed Use the flexeril as needed (muscle relaxer)- may make you drowsy Heating pad You can leave your nose uncovered Continue the antibiotic ointment twice daily Hang in there!!!

## 2012-06-13 NOTE — Assessment & Plan Note (Signed)
New to provider.  Improving.  No evidence of fx or infxn.  No need for xray at this time.  Reviewed wound care for abrasion.  Pt expressed understanding and is in agreement w/ plan.

## 2012-06-13 NOTE — Progress Notes (Signed)
  Subjective:    Patient ID: Jenna Marquez, female    DOB: [birth year], 66 y.o.   MRN: [id number]  HPI S/p fall- pt fell 10 days ago after dropping her phone when getting out of the car.  Fell on face- had bloody nose, road rash on bridge of nose, bruising and swelling under eyes.  Dr Drue Novel ordered nasal bone xray but wrote CXR on pt's instructions.  Pt was confused and did not go for xray.  Radiology called her yesterday and told her she needed to come in.  Nasal pain and swelling is improve.  Developed L hip pain, had xray yesterday- normal.  Hx of DDD.  Pain typically improves w/ NSAID use but not this time.  Pain will radiate down leg.   Review of Systems For ROS see HPI     Objective:   Physical Exam  Vitals reviewed. Constitutional: She appears well-developed and well-nourished. No distress.  HENT:       Superficial abrasion to bridge of nose w/out erythema, induration, or evidence of infxn No TTP over nasal bones Mild bruising under eyes bilaterally  Musculoskeletal:       Lumbar back: She exhibits decreased range of motion (pain w/ flexion), tenderness (L paraspinal tenderness and spasm), pain and spasm. She exhibits no bony tenderness and no swelling.       Back:          Assessment & Plan:

## 2012-06-14 ENCOUNTER — Telehealth: Payer: Self-pay | Admitting: *Deleted

## 2012-06-14 MED ORDER — CYCLOBENZAPRINE HCL 5 MG PO TABS: 5.0000 mg | ORAL_TABLET | Freq: Three times a day (TID) | ORAL | Status: DC | PRN

## 2012-06-14 NOTE — Telephone Encounter (Signed)
Received incoming fax requesting a prior authorization for the pt cyclobenzaprine 5mg , called number provided [phone], to request a faxed form to complete for PA, automated system given fax number and pt JW#[id number] name and dosage, will await form to fill out and fax back when filled, was transferred to [employer] rep Shanda Bumps and was given verbal approval for pt to have this medication starting today through this date 2014, resent medication to pharmacy to go back through the system, called pt to advise the approval has been successful aand RX has been sent through [facility] to [facility], left vm to advise the rx has now been approved and sent on home line per DPR and if pt has any further concerns to call office

## 2012-06-22 ENCOUNTER — Telehealth: Payer: Self-pay | Admitting: Family Medicine

## 2012-06-22 ENCOUNTER — Ambulatory Visit (INDEPENDENT_AMBULATORY_CARE_PROVIDER_SITE_OTHER): Payer: Medicare Other | Admitting: Family Medicine

## 2012-06-22 ENCOUNTER — Encounter: Payer: Self-pay | Admitting: Family Medicine

## 2012-06-22 VITALS — BP 130/72 | HR 90 | Temp 98.4°F | Resp 16 | Wt 128.4 lb

## 2012-06-22 DIAGNOSIS — M5416 Radiculopathy, lumbar region: Secondary | ICD-10-CM

## 2012-06-22 DIAGNOSIS — IMO0002 Reserved for concepts with insufficient information to code with codable children: Secondary | ICD-10-CM

## 2012-06-22 MED ORDER — PREDNISONE 10 MG PO TABS: ORAL_TABLET | ORAL | Status: DC

## 2012-06-22 MED ORDER — METHYLPREDNISOLONE ACETATE 80 MG/ML IJ SUSP
80.0000 mg | Freq: Once | INTRAMUSCULAR | Status: AC
  Administered 2012-06-22: 80 mg via INTRAMUSCULAR

## 2012-06-22 NOTE — Telephone Encounter (Signed)
Pt seen this am by Dr. Beverely Low.

## 2012-06-22 NOTE — Assessment & Plan Note (Signed)
Deteriorated after bending and lifting.  Steroid injxn in office- restart pred taper tomorrow.  Pt already has appt w/ spine specialist upcoming.  Pt to keep that appt.  Continue Flexeril.  Reviewed supportive care and red flags that should prompt return.  Pt expressed understanding and is in agreement w/ plan.

## 2012-06-22 NOTE — Progress Notes (Signed)
  Subjective:    Patient ID: Jenna Marquez, female    DOB: June 26, 1946, 66 y.o.   MRN: [id number]  HPI Back pain- sxs started 2 days ago while bending over to give herself a pedicure.  Stood up, had 'excrutiating pain'.  Taking flexeril w/out relief.  Pain is L sided.  Hx of similar.  Radiating into butt, hip, groin, and down L leg.  Back was feeling good on Prednisone.  Has appt w/ Dr Noel Gerold upcoming on 11/20.  + weakness of leg.  No bowel or bladder incontinence.   Review of Systems For ROS see HPI     Objective:   Physical Exam  Vitals reviewed. Constitutional: She appears well-developed and well-nourished. She appears distressed (obviously uncomfortable).  Musculoskeletal: She exhibits tenderness. She exhibits no edema.       + SLR on L, (-) on R + TTP over L lumbar paraspinal and SI joint No edema Pain w/ flexion and extension  Neurological: She has normal reflexes. No cranial nerve deficit. Coordination normal.          Assessment & Plan:

## 2012-06-22 NOTE — Telephone Encounter (Signed)
[facility] Triage Call Report Triage Record Num: [id number] Operator: Micah Flesher Patient Name: [name] Call Date & Time: 06/21/2012 7:31:40PM Patient Phone: [phone] PCP: Neena Rhymes ([facility]) Patient Gender: Female PCP Fax : Patient DOB: 1945-12-22 Practice Name: [facility] Reason for Call: Caller: Arminda/Patient; PCP: Sheliah Hatch.; CB#: [phone]; Call regarding Back Pain; Onset- 1 week. Afebrile. Guideline- Back Symptoms. Disposition- See Provider within 24 Hours. Care advice given. Protocol(s) Used: Back Symptoms Recommended Outcome per Protocol: See Provider within 24 hours Reason for Outcome: Back pain radiates into thigh or below knee Care Advice: ~ Call provider if symptoms worsen or new symptoms develop. ~ Avoid heavy lifting, bending and twisting of the back, and prolonged sitting until evaluated by provider. Apply a cloth-covered cold or ice pack to the area for 20 minutes 4 to 8 times a day for relief of pain for the first 24-48 hours. After 24 to 48 hours of cold application, use a cloth-covered heat pack to the area for 20 minutes 3 to 4 times a day. ~ Sleep on a moderately firm mattress. Try sleeping on back with a pillow placed under knees or sleep on side with knees bent and a pillow between knees. When lying on stomach, place pillow under the abdomen and pelvis; do not use a pillow for your head. ~ Go to the ED if you have worsening pain, numbness or weakness of arms or legs, cannot walk, or have new unexplained changes in bladder or bowel function. Another adult should drive. ~

## 2012-06-22 NOTE — Patient Instructions (Addendum)
Keep your appt w/ the spine specialist- call and see if they have anything sooner Restart the oral prednisone tomorrow- take w/ food Continue the muscle relaxer as needed- if 1 is not enough, you can take 2 Alternate ice and heat Avoid bending and lifting Call with any questions or concerns Hang in there!

## 2012-07-05 ENCOUNTER — Other Ambulatory Visit (HOSPITAL_BASED_OUTPATIENT_CLINIC_OR_DEPARTMENT_OTHER): Payer: Self-pay | Admitting: Orthopaedic Surgery

## 2012-07-05 DIAGNOSIS — M47817 Spondylosis without myelopathy or radiculopathy, lumbosacral region: Secondary | ICD-10-CM

## 2012-07-05 DIAGNOSIS — M5137 Other intervertebral disc degeneration, lumbosacral region: Secondary | ICD-10-CM

## 2012-07-05 DIAGNOSIS — M4120 Other idiopathic scoliosis, site unspecified: Secondary | ICD-10-CM

## 2012-07-05 DIAGNOSIS — M545 Low back pain: Secondary | ICD-10-CM

## 2012-07-07 ENCOUNTER — Ambulatory Visit (HOSPITAL_BASED_OUTPATIENT_CLINIC_OR_DEPARTMENT_OTHER)
Admission: RE | Admit: 2012-07-07 | Discharge: 2012-07-07 | Disposition: A | Discharge status: Home / Self Care | Payer: Medicare Other | Source: Ambulatory Visit | Attending: Orthopaedic Surgery | Admitting: Orthopaedic Surgery

## 2012-07-07 DIAGNOSIS — M5137 Other intervertebral disc degeneration, lumbosacral region: Secondary | ICD-10-CM

## 2012-07-07 DIAGNOSIS — M4120 Other idiopathic scoliosis, site unspecified: Secondary | ICD-10-CM | POA: Insufficient documentation

## 2012-07-07 DIAGNOSIS — M545 Low back pain, unspecified: Secondary | ICD-10-CM | POA: Insufficient documentation

## 2012-07-07 DIAGNOSIS — M51379 Other intervertebral disc degeneration, lumbosacral region without mention of lumbar back pain or lower extremity pain: Secondary | ICD-10-CM | POA: Insufficient documentation

## 2012-07-07 DIAGNOSIS — M47817 Spondylosis without myelopathy or radiculopathy, lumbosacral region: Secondary | ICD-10-CM | POA: Insufficient documentation

## 2012-07-07 DIAGNOSIS — M79609 Pain in unspecified limb: Secondary | ICD-10-CM | POA: Insufficient documentation

## 2012-07-21 ENCOUNTER — Other Ambulatory Visit: Payer: Self-pay | Admitting: Critical Care Medicine

## 2012-07-23 ENCOUNTER — Encounter: Payer: Self-pay | Admitting: Family Medicine

## 2012-07-23 ENCOUNTER — Ambulatory Visit: Payer: Medicare Other

## 2012-07-23 ENCOUNTER — Ambulatory Visit (INDEPENDENT_AMBULATORY_CARE_PROVIDER_SITE_OTHER): Payer: Medicare Other | Admitting: Family Medicine

## 2012-07-23 VITALS — BP 140/70 | HR 90 | Temp 97.4°F | Ht 65.25 in | Wt 125.6 lb

## 2012-07-23 DIAGNOSIS — J4489 Other specified chronic obstructive pulmonary disease: Secondary | ICD-10-CM

## 2012-07-23 DIAGNOSIS — R030 Elevated blood-pressure reading, without diagnosis of hypertension: Secondary | ICD-10-CM

## 2012-07-23 DIAGNOSIS — J449 Chronic obstructive pulmonary disease, unspecified: Secondary | ICD-10-CM

## 2012-07-23 DIAGNOSIS — M5416 Radiculopathy, lumbar region: Secondary | ICD-10-CM

## 2012-07-23 DIAGNOSIS — IMO0002 Reserved for concepts with insufficient information to code with codable children: Secondary | ICD-10-CM

## 2012-07-23 MED ORDER — IPRATROPIUM BROMIDE 0.02 % IN SOLN: 0.5000 mg | Freq: Four times a day (QID) | RESPIRATORY_TRACT | Status: DC | PRN

## 2012-07-23 MED ORDER — PREDNISONE 10 MG PO TABS: ORAL_TABLET | ORAL | Status: DC

## 2012-07-23 MED ORDER — MOMETASONE FUROATE 50 MCG/ACT NA SUSP: 2.0000 | Freq: Every day | NASAL | Status: DC

## 2012-07-23 NOTE — Progress Notes (Signed)
  Subjective:    Patient ID: Jenna Marquez, female    DOB: Oct 30, 1945, 66 y.o.   MRN: [id number]  HPI Back pain- sxs have improved, changed muscle relaxer.  Had MRI w/ Dr Noel Gerold- results pending.  Has been pain free for 8-9 days.  HTN- chronic problem, stopped Bystolic.  No CP, HAs, visual changes.  Increased SOB but feels this is asthma related.  Denies edema.  COPD- having increased SOB w/ exertion, used proventil w/ some relief.  Using Advair twice daily, spiriva daily.  Started taking left over ampicillin.  Having early morning productive cough.  Restarted smoking recently.   Review of Systems For ROS see HPI     Objective:   Physical Exam  Vitals reviewed. Constitutional: She is oriented to person, place, and time. She appears well-developed and well-nourished. No distress.  HENT:  Head: Normocephalic and atraumatic.  Eyes: Conjunctivae normal and EOM are normal. Pupils are equal, round, and reactive to light.  Neck: Normal range of motion. Neck supple. No thyromegaly present.  Cardiovascular: Normal rate, regular rhythm, normal heart sounds and intact distal pulses.   No murmur heard. Pulmonary/Chest: Effort normal. No respiratory distress. She has wheezes (diffuse expiratory wheezes w/ prolonged expiration.  air moevement improved s/p neb tx).  Abdominal: Soft. She exhibits no distension. There is no tenderness.  Musculoskeletal: She exhibits no edema.  Lymphadenopathy:    She has no cervical adenopathy.  Neurological: She is alert and oriented to person, place, and time.  Skin: Skin is warm and dry.  Psychiatric: She has a normal mood and affect. Her behavior is normal.          Assessment & Plan:

## 2012-07-23 NOTE — Assessment & Plan Note (Signed)
Chronic problem, deteriorated.  Increased wheezing and SOB.  Encouraged neb use and will start steroids for improved air movement.  No abx at this time but pt to call if sxs persist or worsen.  Pt expressed understanding and is in agreement w/ plan.

## 2012-07-23 NOTE — Assessment & Plan Note (Signed)
Pt has stopped bystolic and BP is ok- borderline but not requiring meds at this time.  Will continue to follow closely.  Stressed that she must again quit smoking.  Pt aware and reports she will try.

## 2012-07-23 NOTE — Assessment & Plan Note (Signed)
Much improved.  Following w/ Dr Noel Gerold at [facility].  Awaiting results of MRI.

## 2012-07-23 NOTE — Patient Instructions (Addendum)
Schedule your physical at your convenience Make sure you are using your albuterol inhaler every 2-4 hrs for shortness of breath/wheezing STOP smoking! STOP the Ampicillin Restart the prednisone for the wheezing and shortness of breath Call with any questions or concerns Hang in there! Happy Holidays!

## 2012-07-24 ENCOUNTER — Ambulatory Visit (INDEPENDENT_AMBULATORY_CARE_PROVIDER_SITE_OTHER): Payer: Medicare Other

## 2012-07-24 DIAGNOSIS — Z23 Encounter for immunization: Secondary | ICD-10-CM

## 2012-07-28 ENCOUNTER — Encounter (HOSPITAL_BASED_OUTPATIENT_CLINIC_OR_DEPARTMENT_OTHER): Payer: Self-pay | Admitting: Emergency Medicine

## 2012-07-28 ENCOUNTER — Emergency Department (HOSPITAL_BASED_OUTPATIENT_CLINIC_OR_DEPARTMENT_OTHER)
Admission: EM | Admit: 2012-07-28 | Discharge: 2012-07-28 | Disposition: A | Discharge status: Home / Self Care | Payer: Medicare Other | Attending: Emergency Medicine | Admitting: Emergency Medicine

## 2012-07-28 DIAGNOSIS — F17200 Nicotine dependence, unspecified, uncomplicated: Secondary | ICD-10-CM | POA: Insufficient documentation

## 2012-07-28 DIAGNOSIS — Z79899 Other long term (current) drug therapy: Secondary | ICD-10-CM | POA: Insufficient documentation

## 2012-07-28 DIAGNOSIS — J309 Allergic rhinitis, unspecified: Secondary | ICD-10-CM | POA: Insufficient documentation

## 2012-07-28 DIAGNOSIS — J449 Chronic obstructive pulmonary disease, unspecified: Secondary | ICD-10-CM | POA: Insufficient documentation

## 2012-07-28 DIAGNOSIS — Z9889 Other specified postprocedural states: Secondary | ICD-10-CM | POA: Insufficient documentation

## 2012-07-28 DIAGNOSIS — R05 Cough: Secondary | ICD-10-CM | POA: Insufficient documentation

## 2012-07-28 DIAGNOSIS — J4489 Other specified chronic obstructive pulmonary disease: Secondary | ICD-10-CM | POA: Insufficient documentation

## 2012-07-28 DIAGNOSIS — IMO0002 Reserved for concepts with insufficient information to code with codable children: Secondary | ICD-10-CM | POA: Insufficient documentation

## 2012-07-28 DIAGNOSIS — R059 Cough, unspecified: Secondary | ICD-10-CM | POA: Insufficient documentation

## 2012-07-28 DIAGNOSIS — H9209 Otalgia, unspecified ear: Secondary | ICD-10-CM | POA: Insufficient documentation

## 2012-07-28 DIAGNOSIS — J45909 Unspecified asthma, uncomplicated: Secondary | ICD-10-CM | POA: Insufficient documentation

## 2012-07-28 NOTE — ED Notes (Signed)
Pt believes she is having a sinus infection.  Some runny nose and cough, some ear pain.  No pressure over eyes or fever.   Pt being treated for asthma flare up since Wednesday, but is not taking medications as prescribed.

## 2012-07-28 NOTE — ED Provider Notes (Signed)
History     CSN: [id number]  Arrival date & time 07/28/12  1238   First MD Initiated Contact with Patient 07/28/12 1248      Chief Complaint  Patient presents with  . Otalgia     Patient is a 66 y.o. female presenting with ear pain. The history is provided by the patient.  Otalgia This is a new problem. Episode onset: today. There is pain in the right ear. The problem occurs constantly. The problem has not changed since onset.There has been no fever. The pain is mild. Associated symptoms include cough. Pertinent negatives include no ear discharge, no headaches, no hearing loss, no rhinorrhea, no sore throat, no vomiting and no neck pain.  pt reports recent cough and nasal congestion . She reports today her ears "feels stopped up"  And are "ringing" No hearing loss.  No visual changes.  No HA reported She also reports some pain in right ear No ear drainage No ear trauma No focal weakness, but she does mention fatigue Recently seen by PCP for cough and started on steroids  Past Medical History  Diagnosis Date  . COPD (chronic obstructive pulmonary disease)   . Tobacco abuse   . Asthma   . Allergic rhinitis     Past Surgical History  Procedure Date  . Ectopic pregnancy surgery   . Tonsillectomy and adenoidectomy at age 33    Family History  Problem Relation Age of Onset  . Coronary artery disease Mother   . Heart disease Mother     CHF  . Diabetes Neg Hx   . Cancer Neg Hx   . Stroke Neg Hx     History  Substance Use Topics  . Smoking status: Current Every Day Smoker -- 0.3 packs/day    Types: Cigarettes    Last Attempt to Quit: 10/18/2011  . Smokeless tobacco: Never Used     Comment: Started at age 40  . Alcohol Use: 12.6 oz/week    21 Glasses of wine per week     Comment: socially    OB History    Grav Para Term Preterm Abortions TAB SAB Ect Mult Living                  Review of Systems  HENT: Positive for ear pain. Negative for hearing loss, sore  throat, rhinorrhea, neck pain and ear discharge.   Respiratory: Positive for cough.   Gastrointestinal: Negative for vomiting.  Neurological: Negative for headaches.    Allergies  Avelox  Home Medications   Current Outpatient Rx  Name  Route  Sig  Dispense  Refill  . ALBUTEROL SULFATE (2.5 MG/3ML) 0.083% IN NEBU   Nebulization   Take 3 mLs (2.5 mg total) by nebulization every 6 (six) hours as needed. DX 496   75 mL   6   . CALCIUM 500 MG PO CHEW   Oral   Chew 1 tablet by mouth 2 (two) times daily.           . COD LIVER OIL PO   Oral   Take by mouth 2 (two) times daily. Take tow capsules          . ERGOCALCIFEROL 50000 UNITS PO CAPS   Oral   Take 50,000 Units by mouth every 30 (thirty) days.           Marland Kitchen FLUTICASONE-SALMETEROL 250-50 MCG/DOSE IN AEPB   Inhalation   Inhale 1 puff into the lungs every 12 (twelve) hours.  60 each   6   . MOMETASONE FUROATE 50 MCG/ACT NA SUSP   Nasal   Place 2 sprays into the nose at bedtime.   17 g   3   . PREDNISONE 10 MG PO TABS      3 tabs x3 days and then 2 tabs x3 days and then 1 tab x3 days.  Take w/ food.   18 tablet   0   . PROVENTIL HFA 108 (90 BASE) MCG/ACT IN AERS      INHALE 2 PUFFS INTO THE LUNGS EVERY 6 HOURS AS NEEDED   6.7 g   0   . TIOTROPIUM BROMIDE MONOHYDRATE 18 MCG IN CAPS   Inhalation   Place 1 capsule (18 mcg total) into inhaler and inhale daily.   30 capsule   6     BP 135/79  Pulse 75  Temp 98.7 F (37.1 C) (Oral)  Resp 16  SpO2 99%  Physical Exam CONSTITUTIONAL: Well developed/well nourished HEAD AND FACE: Normocephalic/atraumatic EYES: EOMI/PERRL ENMT: Mucous membranes moist, right TM clear and intact.  Left TM does appear erythematous but it is intact.  No drainage.  Ears symmetric.  No obvious mastoid tenderness.   Nasal congestion noted NECK: supple no meningeal signs, no bruits noted SPINE:entire spine nontender CV: S1/S2 noted, no murmurs/rubs/gallops noted LUNGS:  scattered wheeze noted but no distress ABDOMEN: soft, nontender, no rebound or guarding GU:no cva tenderness NEURO: Pt is awake/alert, moves all extremitiesx4 No ataxia.  No focal weakness noted EXTREMITIES: pulses normal, full ROM SKIN: warm, color normal PSYCH: no abnormalities of mood noted  ED Course  Procedures   1. Otalgia    Pt with likely URI/sinusitis.  She is well appearing.  I offered antibiotic for possible otitis media (zpack which she refused and she reports amox does not work) I then also advised to use nasal steroids Pt became angry and reported she would f/u with her PCP    MDM  Nursing notes including past medical history and social history reviewed and considered in documentation Previous records reviewed and considered         Joya Gaskins, MD 07/28/12 1334

## 2012-07-28 NOTE — ED Notes (Signed)
Patient was upset and stated that she did not understand her plan of care and asked why her primary MD referred her to come here. She stated that she stopped taking her prednisone and had taken some amoxicillin she had found at home. Explained the purpose of the prednisone prescribed by her MD and provided instructions on care for her sinus pain at home. Patient verbalized understanding

## 2012-07-30 ENCOUNTER — Telehealth: Payer: Self-pay | Admitting: Family Medicine

## 2012-07-30 NOTE — Telephone Encounter (Signed)
[facility] Triage Call Report Triage Record Num: [id number] Operator: Roselyn Meier Patient Name: Jenna Marquez Call Date & Time: 07/28/2012 11:43:23AM Patient Phone: [phone] PCP: Neena Rhymes ([facility]) Patient Gender: Female PCP Fax : Patient DOB: 1946-05-19 Practice Name: [facility] Reason for Call: Caller: Arelys/Patient; PCP: Sheliah Hatch.; CB#: [phone]; Call regarding Ringing in ears; Pt was seen in the office on 07/26/12 and put in prednisone. Pt reports she was being treated for ringing in ears. Pt states ringing is back and is causing her to be off balance. Triaged patient per Dizziness or Vertigo Protocol. See Provider within 24 Hours Disposition for 'Previously evaluated and worsening symptoms interfering with ability to carry out activities of daily living'. Care advice given per protocol. RN offered appt at [facility], pt declined. Pt states she will go to [facility] Protocol(s) Used: Dizziness or Vertigo Recommended Outcome per Protocol: See Provider within 24 hours Reason for Outcome: Previously evaluated and worsening symptoms interfering with ability to carry out activities

## 2012-07-30 NOTE — Telephone Encounter (Signed)
noted 

## 2012-08-20 ENCOUNTER — Ambulatory Visit (INDEPENDENT_AMBULATORY_CARE_PROVIDER_SITE_OTHER): Payer: Medicare Other | Admitting: Pulmonary Disease

## 2012-08-20 ENCOUNTER — Encounter: Payer: Self-pay | Admitting: Pulmonary Disease

## 2012-08-20 VITALS — BP 118/64 | HR 100 | Temp 97.1°F | Ht 66.0 in | Wt 127.4 lb

## 2012-08-20 DIAGNOSIS — J449 Chronic obstructive pulmonary disease, unspecified: Secondary | ICD-10-CM

## 2012-08-20 MED ORDER — ALBUTEROL SULFATE (2.5 MG/3ML) 0.083% IN NEBU: 2.5000 mg | INHALATION_SOLUTION | Freq: Four times a day (QID) | RESPIRATORY_TRACT | Status: DC | PRN

## 2012-08-20 NOTE — Assessment & Plan Note (Signed)
Recent flare Obtain Ct scan chest report from [place]  Complete course on prednisone Stop doxycycline - complete azithromycin course Stay on singulair Call if no better after above

## 2012-08-20 NOTE — Progress Notes (Signed)
  Subjective:    Patient ID: Jenna Marquez, female    DOB: February 16, 1946, 67 y.o.   MRN: [id number]  HPI 67 y.o. AAF, smoker referred for COPD AB  HPI Comments: Prior [facility] pt. Dr Beaulah Dinning referred for second opinion for Copd  Dx Copd by [facility] 01/19/09. Dx with asthma as a child. Environmental allergies per [place]: trees, mold, dust pollen, never Rx allergy shots    08/20/2012 [facility] pt. Pt still smoking Pt husband just passed away 01-20-12.  pt was d/c from hospital  X 5 ds in [facility] 08/16/11. c/o SOB, chest congestion, ear pressure, cough w/ clear-brown phlem, wheezing.treated with both doxycyline &azithromycin and prednsione Green sputum Singulair & claritin was added to regimen Prednisone was started by Dr Beverely Low in dec Still c/o green phlegm  Review of Systems neg for any significant sore throat, dysphagia, itching, sneezing, nasal congestion or excess/ purulent secretions, fever, chills, sweats, unintended wt loss, pleuritic or exertional cp, hempoptysis, orthopnea pnd or change in chronic leg swelling. Also denies presyncope, palpitations, heartburn, abdominal pain, nausea, vomiting, diarrhea or change in bowel or urinary habits, dysuria,hematuria, rash, arthralgias, visual complaints, headache, numbness weakness or ataxia.     Objective:   Physical Exam  Gen. anxioust, thin woman, in no distress ENT - no lesions, no post nasal drip Neck: No JVD, no thyromegaly, no carotid bruits Lungs: no use of accessory muscles, no dullness to percussion, decreased without rales or rhonchi  Cardiovascular: Rhythm regular, heart sounds  normal, no murmurs or gallops, no peripheral edema Musculoskeletal: No deformities, no cyanosis or clubbing        Assessment & Plan:

## 2012-08-20 NOTE — Patient Instructions (Addendum)
Obtain Ct scan chest report from [place]  Complete course on prednisone Stop doxycycline - complete azithromycin course Stay on singulair Call if no better after above You have to stop smoking

## 2012-08-21 ENCOUNTER — Telehealth: Payer: Self-pay | Admitting: Pulmonary Disease

## 2012-08-21 NOTE — Telephone Encounter (Signed)
Spoke to pt and she had a question about an outstanding order that showed up on her AVS. I advised her that it was something that had been ordered on her back in 01/2012 and that had already been completed. She verbalized understand and I apologized for any confusion.

## 2012-08-22 ENCOUNTER — Ambulatory Visit (INDEPENDENT_AMBULATORY_CARE_PROVIDER_SITE_OTHER): Payer: Medicare Other | Admitting: Family Medicine

## 2012-08-22 ENCOUNTER — Encounter: Payer: Self-pay | Admitting: Family Medicine

## 2012-08-22 VITALS — BP 120/58 | HR 108 | Temp 98.2°F | Ht 66.0 in | Wt 124.2 lb

## 2012-08-22 DIAGNOSIS — M5416 Radiculopathy, lumbar region: Secondary | ICD-10-CM

## 2012-08-22 DIAGNOSIS — J449 Chronic obstructive pulmonary disease, unspecified: Secondary | ICD-10-CM

## 2012-08-22 DIAGNOSIS — IMO0002 Reserved for concepts with insufficient information to code with codable children: Secondary | ICD-10-CM

## 2012-08-22 NOTE — Patient Instructions (Addendum)
Continue the Claritin in addition to the Singulair Continue the Advair and the Spiriva Albuterol as needed Call with any questions or concerns Hang in there! Happy New Year!!!

## 2012-08-22 NOTE — Progress Notes (Signed)
  Subjective:    Patient ID: Jenna Marquez, female    DOB: 10-21-1945, 67 y.o.   MRN: [id number]  HPI COPD- was hospitalized while in [facility] for COPD exacerbation/acute bronchitis for 5 days.  Saw pulmonary yesterday Vassie Loll).  Using Singulair and Claritin, on Spiriva, albuterol, Advair.  Completed prednisone course and finishing up Zpack.  Doxy was stopped by Dr Vassie Loll.    Back pain- following w/ Dr Noel Gerold.  Pain has resolved w/ Tizanidine and gabapentin.  Now following w/ ortho prn.   Review of Systems For ROS see HPI     Objective:   Physical Exam  Vitals reviewed. Constitutional: She appears well-developed and well-nourished. No distress.  HENT:  Head: Normocephalic and atraumatic.  Nose: Nose normal.  Mouth/Throat: Oropharynx is clear and moist.  Neck: Normal range of motion. Neck supple.  Cardiovascular: Normal rate and regular rhythm.   Murmur (II/VI SEM at RUSB) heard. Pulmonary/Chest: Effort normal. No respiratory distress. She has wheezes (diffuse expiratory wheezes w/ prolonged expiration). She has no rales.  Lymphadenopathy:    She has no cervical adenopathy.          Assessment & Plan:

## 2012-08-23 NOTE — Assessment & Plan Note (Signed)
Following w/ Dr Noel [facility]old at [facility].  Pain has currently resolved.

## 2012-08-23 NOTE — Assessment & Plan Note (Signed)
Following w/ Pulm for this issue.  Reports she has stopped smoking as of her trip to [facility].  Currently using a nicotine patch.  Taking inhalers/meds as directed.  Still having wheezing but this will likely be an ongoing issue for pt.  Will follow along and assist as able.  Pt expressed understanding and is in agreement w/ plan.

## 2012-08-27 ENCOUNTER — Telehealth: Payer: Self-pay | Admitting: Critical Care Medicine

## 2012-08-27 ENCOUNTER — Telehealth: Payer: Self-pay | Admitting: Family Medicine

## 2012-08-27 DIAGNOSIS — H9209 Otalgia, unspecified ear: Secondary | ICD-10-CM

## 2012-08-27 NOTE — Telephone Encounter (Signed)
Pt's dauughter, Janice Coffin, says that the pt is having increased heart rhythm off and on and increased sob with activity. This has been going on for over 1 week. The pt denies any chest pain. She has been using Theophylline and the Nicotine Patch at the same time and the daughter questions if the patch could be amplifying the Theophylline. Janice Coffin is aware Dr. Delford Field is out of the office and we will forward this to the doc of the day, Dr. Marchelle Gearing for recs. Pls advise. Allergies  Allergen Reactions  . Avelox (Moxifloxacin Hcl In Nacl)

## 2012-08-27 NOTE — Telephone Encounter (Signed)
Pt is aware of MR recs but wants to hold off on doing anything right now. She has refused an ov with TP and does not want to do blood work. She is going to wait a day or so and stop the Claritin and nicotine patch to see if this helps. She will call back to let PW know how she is doing in a few days. Pt and daughter have been instructed to call 911 if the pt's breathing gets worse. Will forward to PW so he is aware.

## 2012-08-27 NOTE — Telephone Encounter (Signed)
Please advise on referral ENT.  Last OV:08-22-12.//AB/CMA

## 2012-08-27 NOTE — Telephone Encounter (Signed)
Ok for ENT referral- specify pt prefers [facility]

## 2012-08-27 NOTE — Telephone Encounter (Signed)
Not sure if nicotine patch makes theo worse but more than likely theo levels have risen on own or patient having idiosyncratic reaction to theo. If they are interested, they should come and checkt theo level or take it up with Dr Delford Field

## 2012-08-27 NOTE — Telephone Encounter (Signed)
pt would like a referral to ENT has been seen here, Urgent Care and in ED for this issue--pt has completed ABX, but ears are still full, ringing and sounds static like [phone] Pt prefer [place]

## 2012-08-27 NOTE — Telephone Encounter (Signed)
Referral placed per [place] Dx ear pain. Pt aware

## 2012-08-28 NOTE — Telephone Encounter (Signed)
i agree with MR recs.  She was just seen by RA on 08/21/12.  Prob needs to be seen again

## 2012-08-29 ENCOUNTER — Telehealth: Payer: Self-pay | Admitting: Critical Care Medicine

## 2012-08-29 NOTE — Telephone Encounter (Signed)
I spoke with pt aware of PW recs. She voiced her understanding. Nothing further was needed

## 2012-08-29 NOTE — Telephone Encounter (Signed)
i do not think long term theophylline is a good medications for her.  Stop this for now Make an appt to regroup sometime

## 2012-08-29 NOTE — Telephone Encounter (Signed)
Called and spoke with pt and she stated that she did stop the claritin and has taken the patch off now.  She stated that she is using the Singulair daily.  She stated that the shaking and palpitations has gotten better some.  Pt is wanting to know if she needs to stay on the theophylline.  She stated that she has not taken this medication today since she did go and see the ENT doctor.  Wanted to call and let PW know how she was doing off the claritin.  PW please advise about pt taking the theophylline.  Thanks  Allergies  Allergen Reactions  . Avelox (Moxifloxacin Hcl In Nacl)

## 2012-09-07 ENCOUNTER — Other Ambulatory Visit: Payer: Self-pay | Admitting: Critical Care Medicine

## 2012-09-07 ENCOUNTER — Telehealth: Payer: Self-pay | Admitting: Critical Care Medicine

## 2012-09-07 NOTE — Telephone Encounter (Signed)
RX was this AM. I called and made pt aware of this. Nothing further was needed

## 2012-09-20 ENCOUNTER — Ambulatory Visit (HOSPITAL_BASED_OUTPATIENT_CLINIC_OR_DEPARTMENT_OTHER)
Admission: RE | Admit: 2012-09-20 | Discharge: 2012-09-20 | Disposition: A | Discharge status: Home / Self Care | Payer: Medicare Other | Source: Ambulatory Visit | Attending: Critical Care Medicine | Admitting: Critical Care Medicine

## 2012-09-20 ENCOUNTER — Ambulatory Visit (INDEPENDENT_AMBULATORY_CARE_PROVIDER_SITE_OTHER): Payer: Medicare Other | Admitting: Critical Care Medicine

## 2012-09-20 ENCOUNTER — Other Ambulatory Visit: Payer: Self-pay | Admitting: Critical Care Medicine

## 2012-09-20 ENCOUNTER — Encounter: Payer: Self-pay | Admitting: Critical Care Medicine

## 2012-09-20 ENCOUNTER — Telehealth: Payer: Self-pay | Admitting: Critical Care Medicine

## 2012-09-20 VITALS — BP 124/80 | HR 104 | Temp 97.4°F | Ht 66.5 in | Wt 128.0 lb

## 2012-09-20 DIAGNOSIS — J449 Chronic obstructive pulmonary disease, unspecified: Secondary | ICD-10-CM | POA: Insufficient documentation

## 2012-09-20 DIAGNOSIS — R059 Cough, unspecified: Secondary | ICD-10-CM | POA: Insufficient documentation

## 2012-09-20 DIAGNOSIS — J439 Emphysema, unspecified: Secondary | ICD-10-CM

## 2012-09-20 DIAGNOSIS — R05 Cough: Secondary | ICD-10-CM | POA: Insufficient documentation

## 2012-09-20 DIAGNOSIS — J438 Other emphysema: Secondary | ICD-10-CM

## 2012-09-20 DIAGNOSIS — J4489 Other specified chronic obstructive pulmonary disease: Secondary | ICD-10-CM | POA: Insufficient documentation

## 2012-09-20 MED ORDER — MOMETASONE FUROATE 50 MCG/ACT NA SUSP: 2.0000 | Freq: Two times a day (BID) | NASAL | Status: DC

## 2012-09-20 MED ORDER — AZITHROMYCIN 250 MG PO TABS: ORAL_TABLET | ORAL | Status: DC

## 2012-09-20 MED ORDER — MONTELUKAST SODIUM 10 MG PO TABS: 10.0000 mg | ORAL_TABLET | Freq: Every day | ORAL | Status: DC

## 2012-09-20 NOTE — Progress Notes (Signed)
Subjective:    Patient ID: Jenna Marquez, female    DOB: 1946-01-17, 67 y.o.   MRN: [id number]  HPI  67 y.o. AAF, smoker referred for COPD AB  HPI Comments: Prior [place] pt. Dr Beaulah Dinning referred for second opinion for Copd  Dx Copd by [facility] Jan 09, 2009. Dx with asthma as a child. Environmental allergies per [place]: trees, mold, dust pollen, never Rx allergy shots    08/20/12 PW pt. Pt still smoking Pt husband just passed away 01/10/2012.  pt was d/c from hospital  X 5 ds in [facility] 08/16/11. c/o SOB, chest congestion, ear pressure, cough w/ clear-brown phlem, wheezing.treated with both doxycyline &azithromycin and prednsione Green sputum Singulair & claritin was added to regimen Prednisone was started by Dr Beverely Low in dec Still c/o green phlegm  09/20/2012  Pt was hosp for AECOPD in [place], visiting family over holidays.  Pt went back to smoking.   Now off smoking 7weeks.  Rx steroids and Abx.  Mucus now is dark gray and yellow. Cough is better now compared to 08/2012 Pt denies any significant sore throat, nasal congestion or excess secretions, fever, chills, sweats, unintended weight loss, pleurtic or exertional chest pain, orthopnea PND, or leg swelling Pt denies any increase in rescue therapy over baseline, denies waking up needing it or having any early am or nocturnal exacerbations of coughing/wheezing/or dyspnea. Pt also denies any obvious fluctuation in symptoms with  weather or environmental change or other alleviating or aggravating factors      Review of Systems Constitutional:   No  weight loss, night sweats,  Fevers, chills, fatigue, lassitude. HEENT:   No headaches,  Difficulty swallowing,  Tooth/dental problems,  Sore throat,                No sneezing, itching, ear ache, nasal congestion, post nasal drip,   CV:  No chest pain,  Orthopnea, PND, swelling in lower extremities, anasarca, dizziness, palpitations  GI  No heartburn, indigestion, abdominal pain, nausea, vomiting,  diarrhea, change in bowel habits, loss of appetite  Resp: Notes  shortness of breath with exertion not at rest.  No excess mucus, notes productive cough,  No non-productive cough,  No coughing up of blood.  No change in color of mucus.  No wheezing.  No chest wall deformity  Skin: no rash or lesions.  GU: no dysuria, change in color of urine, no urgency or frequency.  No flank pain.  MS:  No joint pain or swelling.  No decreased range of motion.  No back pain.  Psych:  No change in mood or affect. No depression or anxiety.  No memory loss.     Objective:   Physical Exam  Filed Vitals:   09/20/12 1005  BP: 124/80  Pulse: 104  Temp: 97.4 F (36.3 C)  TempSrc: Oral  Height: 5' 6.5" (1.689 m)  Weight: 128 lb (58.06 kg)  SpO2: 99%    Gen: Pleasant, thin in no distress,  normal affect  ENT: No lesions,  mouth clear,  oropharynx clear, no postnasal drip  Neck: No JVD, no TMG, no carotid bruits  Lungs: No use of accessory muscles, no dullness to percussion, distant breath sounds with expired wheezes  Cardiovascular: RRR, heart sounds normal, no murmur or gallops, no peripheral edema  Abdomen: soft and NT, no HSM,  BS normal  Musculoskeletal: No deformities, no cyanosis or clubbing  Neuro: alert, non focal  Skin: Warm, no lesions or rashes  Dg Chest 2 View  09/20/2012  *  RADIOLOGY REPORT*  Clinical Data: Cough, COPD.  CHEST - 2 VIEW  Comparison: Dec 31, 2011.  Findings: Cardiomediastinal silhouette appears normal.  No acute pulmonary disease is noted.  Bony thorax is intact.  Hyperinflation of the lungs is noted.  IMPRESSION: Hyperinflation of the lungs.  No acute cardiopulmonary abnormality seen.   Original Report Authenticated By: Lupita Raider.,  M.D.         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 Gold stage C. COPD with recent exacerbation requiring hospitalization in [place]. The patient is now still symptomatic with ongoing  lower airway inflammation. Note the patient has been off smoking for 7 weeks. Plan Stay on advair/spiriva Stop Claritin Increase Nasonex to twice daily two puff Return 2 months    Updated Medication List Outpatient Encounter Prescriptions as of 09/20/2012  Medication Sig Dispense Refill  . albuterol (PROVENTIL) (2.5 MG/3ML) 0.083% nebulizer solution Take 3 mLs (2.5 mg total) by nebulization every 6 (six) hours as needed. DX 496  75 mL  6  . Calcium 500 MG CHEW Chew 1 tablet by mouth daily.       . ergocalciferol (VITAMIN D2) 50000 UNITS capsule Take 50,000 Units by mouth every 30 (thirty) days.        Marland Kitchen gabapentin (NEURONTIN) 300 MG capsule Take 300 mg by mouth 3 (three) times daily as needed.       Marland Kitchen guaiFENesin (MUCINEX) 600 MG 12 hr tablet Take 600 mg by mouth daily as needed.       . montelukast (SINGULAIR) 10 MG tablet Take 1 tablet (10 mg total) by mouth at bedtime.  30 tablet  6  . PROVENTIL HFA 108 (90 BASE) MCG/ACT inhaler INHALE 2 PUFFS INTO THE LUNGS EVERY 6 HOURS AS NEEDED  6.7 g  0  . SPIRIVA HANDIHALER 18 MCG inhalation capsule PLACE 1 CAPSULE INTO INHALER AND INHALE DAILY  30 capsule  5  . [DISCONTINUED] Fluticasone-Salmeterol (ADVAIR DISKUS) 250-50 MCG/DOSE AEPB Inhale 1 puff into the lungs every 12 (twelve) hours.  60 each  6  . [DISCONTINUED] loratadine (CLARITIN) 10 MG tablet Take 10 mg by mouth daily.      . [DISCONTINUED] montelukast (SINGULAIR) 10 MG tablet Take 10 mg by mouth at bedtime.      Marland Kitchen azithromycin (ZITHROMAX) 250 MG tablet Take two once then one daily until gone  6 each  0  . mometasone (NASONEX) 50 MCG/ACT nasal spray Place 2 sprays into the nose 2 (two) times daily.  17 g  6  . tiZANidine (ZANAFLEX) 4 MG tablet Take 4 mg by mouth every 8 (eight) hours as needed.      . [DISCONTINUED] azithromycin (ZITHROMAX) 250 MG tablet Take as directed      . [DISCONTINUED] doxycycline (VIBRA-TABS) 100 MG tablet Take 100 mg by mouth 2 (two) times daily.      .  [DISCONTINUED] mometasone (NASONEX) 50 MCG/ACT nasal spray Place 2 sprays into the nose at bedtime.  17 g  3  . [DISCONTINUED] Nicotine 21-14-7 MG/24HR KIT Place onto the skin.      . [DISCONTINUED] predniSONE (DELTASONE) 10 MG tablet Take as directed      . [DISCONTINUED] theophylline (THEODUR) 300 MG 12 hr tablet Take 300 mg by mouth daily.

## 2012-09-20 NOTE — Assessment & Plan Note (Addendum)
Gold stage C. COPD with recent exacerbation requiring hospitalization in [place]. The patient is now still symptomatic with ongoing lower airway inflammation. Note the patient has been off smoking for 7 weeks. Plan Stay on advair/spiriva Stop Claritin Increase Nasonex to twice daily two puff Return 2 months

## 2012-09-20 NOTE — Patient Instructions (Addendum)
A chest xray will be obtained We will obtain records/CT Scan report from your hospital stay in [place] Stay on advair/spiriva Stop Claritin Increase Nasonex to twice daily two puff Return 2 months

## 2012-09-20 NOTE — Telephone Encounter (Signed)
Result Note     Notify the patient that the Xray is stable and no pneumonia, just stable Copd   No change in medications are recommended.   Continue current meds as prescribed at last office visit  ----  I spoke with patient about results and she verbalized understanding and had no questions.

## 2012-09-20 NOTE — Progress Notes (Signed)
Quick Note:  Notify the patient that the Xray is stable and no pneumonia, just stable Copd No change in medications are recommended. Continue current meds as prescribed at last office visit ______

## 2012-09-24 NOTE — Progress Notes (Signed)
Quick Note:  Called, spoke with pt. Informed her of cxr results and recs per Dr. Wright. She verbalized understanding. ______ 

## 2012-10-23 ENCOUNTER — Ambulatory Visit (INDEPENDENT_AMBULATORY_CARE_PROVIDER_SITE_OTHER): Payer: Medicare Other | Admitting: Family Medicine

## 2012-10-23 ENCOUNTER — Encounter: Payer: Self-pay | Admitting: Family Medicine

## 2012-10-23 DIAGNOSIS — H6590 Unspecified nonsuppurative otitis media, unspecified ear: Secondary | ICD-10-CM

## 2012-10-23 DIAGNOSIS — H9319 Tinnitus, unspecified ear: Secondary | ICD-10-CM

## 2012-10-23 DIAGNOSIS — Z5189 Encounter for other specified aftercare: Secondary | ICD-10-CM

## 2012-10-23 DIAGNOSIS — S0990XA Unspecified injury of head, initial encounter: Secondary | ICD-10-CM

## 2012-10-23 DIAGNOSIS — S0990XD Unspecified injury of head, subsequent encounter: Secondary | ICD-10-CM

## 2012-10-23 DIAGNOSIS — H9313 Tinnitus, bilateral: Secondary | ICD-10-CM

## 2012-10-23 DIAGNOSIS — J309 Allergic rhinitis, unspecified: Secondary | ICD-10-CM

## 2012-10-23 HISTORY — DX: Tinnitus, bilateral: H93.13

## 2012-10-23 HISTORY — DX: Unspecified injury of head, initial encounter: S09.90XA

## 2012-10-23 NOTE — Assessment & Plan Note (Signed)
Try dymista in place of nasonex con't singulair F/u allergy/ pulm

## 2012-10-23 NOTE — Assessment & Plan Note (Signed)
Pt has seen ENT -- nothing found but pt was not happy with Dr she saw If MRI neg and it does not resolve she may want to see a new ENT

## 2012-10-23 NOTE — Patient Instructions (Addendum)
Serous Otitis Media    Serous otitis media is also known as otitis media with effusion (OME). It means there is fluid in the middle ear space. This space contains the bones for hearing and air. Air in the middle ear space helps to transmit sound.    The air gets there through the eustachian tube. This tube goes from the back of the throat to the middle ear space. It keeps the pressure in the middle ear the same as the outside world. It also helps to drain fluid from the middle ear space.  CAUSES    OME occurs when the eustachian tube gets blocked. Blockage can come from:   Ear infections.   Colds and other upper respiratory infections.   Allergies.   Irritants such as cigarette smoke.   Sudden changes in air pressure (such as descending in an airplane).   Enlarged adenoids.  During colds and upper respiratory infections, the middle ear space can become temporarily filled with fluid. This can happen after an ear infection also. Once the infection clears, the fluid will generally drain out of the ear through the eustachian tube. If it does not, then OME occurs.  SYMPTOMS     Hearing loss.   A feeling of fullness in the ear  but no pain.   Young children may not show any symptoms.  DIAGNOSIS     Diagnosis of OME is made by an ear exam.   Tests may be done to check on the movement of the eardrum.   Hearing exams may be done.  TREATMENT     The fluid most often goes away without treatment.   If allergy is the cause, allergy treatment may be helpful.   Fluid that persists for several months may require minor surgery. A small tube is placed in the ear drum to:   Drain the fluid.   Restore the air in the middle ear space.   In certain situations, antibiotics are used to avoid surgery.   Surgery may be done to remove enlarged adenoids (if this is the cause).  HOME CARE INSTRUCTIONS     Keep children away from tobacco smoke.   Be sure to keep follow up appointments, if any.  SEEK MEDICAL CARE IF:      Hearing is not better in 3 months.   Hearing is worse.   Ear pain.   Drainage from the ear.   Dizziness.  Document Released: 10/22/2003 Document Revised: 10/24/2011 Document Reviewed: 08/21/2008  Exit[employer] Patient Information 2013 [employer].

## 2012-10-23 NOTE — Assessment & Plan Note (Signed)
Symptoms of tinnitis and pressure started after head injury MRI brain

## 2012-10-23 NOTE — Progress Notes (Signed)
  Subjective:    Patient ID: Jenna Marquez, female    DOB: 1946-06-24, 67 y.o.   MRN: [id number]  HPI Pt here c/o nausea since stopping claritin and still c/o pressure in ears and tinnitus.  She has seen pulm , allergy and ENT.   Pt is frustrated but then admitted to head injury in Nov. She never saw anyone for this.  Symptoms with ears started then.  Her daughter in law is with her and she is concerned about a mass.      Review of Systems As above     Objective:   Physical Exam  BP 130/82  Pulse 101  Temp(Src) 98.2 F (36.8 C) (Oral)  Wt 128 lb 3.2 oz (58.151 kg)  BMI 20.38 kg/m2  SpO2 98% General appearance: alert, cooperative, appears stated age and no distress Ears: normal TM's and external ear canals both ears Nose: Nares normal. Septum midline. Mucosa normal. No drainage or sinus tenderness. Throat: lips, mucosa, and tongue normal; teeth and gums normal Neck: no adenopathy, supple, symmetrical, trachea midline and thyroid not enlarged, symmetric, no tenderness/mass/nodules Lungs: clear to auscultation bilaterally       Assessment & Plan:

## 2012-11-03 ENCOUNTER — Ambulatory Visit (HOSPITAL_BASED_OUTPATIENT_CLINIC_OR_DEPARTMENT_OTHER)
Admission: RE | Admit: 2012-11-03 | Discharge: 2012-11-03 | Disposition: A | Discharge status: Home / Self Care | Payer: Medicare Other | Source: Ambulatory Visit | Attending: Family Medicine | Admitting: Family Medicine

## 2012-11-03 DIAGNOSIS — H9313 Tinnitus, bilateral: Secondary | ICD-10-CM

## 2012-11-03 DIAGNOSIS — J329 Chronic sinusitis, unspecified: Secondary | ICD-10-CM | POA: Insufficient documentation

## 2012-11-03 DIAGNOSIS — H9319 Tinnitus, unspecified ear: Secondary | ICD-10-CM | POA: Insufficient documentation

## 2012-11-06 ENCOUNTER — Other Ambulatory Visit: Payer: Self-pay | Admitting: *Deleted

## 2012-11-06 ENCOUNTER — Telehealth: Payer: Self-pay | Admitting: *Deleted

## 2012-11-06 DIAGNOSIS — Z87828 Personal history of other (healed) physical injury and trauma: Secondary | ICD-10-CM

## 2012-11-06 DIAGNOSIS — J811 Chronic pulmonary edema: Secondary | ICD-10-CM

## 2012-11-06 DIAGNOSIS — H9319 Tinnitus, unspecified ear: Secondary | ICD-10-CM

## 2012-11-06 MED ORDER — AZELASTINE HCL 0.1 % NA SOLN: 1.0000 | Freq: Two times a day (BID) | NASAL | Status: DC

## 2012-11-06 MED ORDER — AZELASTINE-FLUTICASONE 137-50 MCG/ACT NA SUSP: 1.0000 | Freq: Every day | NASAL | Status: DC

## 2012-11-06 NOTE — Telephone Encounter (Addendum)
Pt called stating that she concerned b/c no one followed up with her to see how she was doing on the new nasal spray that was given to her at her last visit.  She stated that she was given the sample for Dymista and she was told to take it for 7 days, and she did that now she want to know if she's to continue on it.  Asked the pt if it helped and she stated it did helped, she feels it was getting the mucous up.   She said the Nasonex dried her out.  Informed the pt that I will speak with Dr. Laury Axon and see if she wants her to continue on the new NS and I will call her back.   Pt agreed.  Pt also mentioned that no one had informed her of her recent MRI results, then she stated that she spoke with someone and they told her the MRI results, but that Dr. Laury Axon wanted to refer her to see an Neuro.  Pt said she has not heard from anyone about the Neuro appt.  Called the pt back and informed her that Dr. Laury Axon stated she can continue on the Dymista, but no sure how much her ins will pay b/c the cost will be higher.  Pt stated she checked with the pharmacy and the cost is going to be $180.00.  Explained to the pt that the Dymista is a combination of 2 meds and the Astelin nasal is one of the meds, and Dr. Laury Axon said we can call in the Astelin nasal spray for her.  Pt agreed to use the Astelin nasal spray informed her she's to take it 1-2 sprays ea nostril BID.  Informed the pt I will sent the rx to her pharmacy([facility] [address]).  RX sent to the pharmacy by e-script.  Also informed the pt that her referral to the Neuro has been faxed and she will hear back regarding an appt.  Pt. understood and agreed to everything.//AB/CMA

## 2012-11-06 NOTE — Telephone Encounter (Signed)
Refill for dymista sent to [facility] [address] [place]

## 2012-11-07 ENCOUNTER — Telehealth: Payer: Self-pay | Admitting: Family Medicine

## 2012-11-07 MED ORDER — ALPRAZOLAM 0.5 MG PO TABS: 0.5000 mg | ORAL_TABLET | Freq: Every evening | ORAL | Status: DC | PRN

## 2012-11-07 NOTE — Telephone Encounter (Signed)
Ok for xanax 0.5mg  QHS prn sleep.  #30, no refills.  needs to sign agreement and do UDS if not already done

## 2012-11-07 NOTE — Telephone Encounter (Signed)
PATIENT ALSO NEEDS REFILL ON    DYMISTA 375/50 MCG NASAL SPR 120SPR#[id number]  SIG: INSTILL A SPRAY INTO EACH NOSTRIL DAILY LAST FILLED 03.25.2014

## 2012-11-07 NOTE — Telephone Encounter (Signed)
Patient aware and she is coming to pick up the Rx.      KP

## 2012-11-07 NOTE — Telephone Encounter (Signed)
Ok to refill Dymista.  We will wait for her neuro appt in regards to her pains- if the pain is severe, she needs to go to ER

## 2012-11-07 NOTE — Telephone Encounter (Addendum)
Patient calling about in tears, states her nerves are "just tore up".  States she has been unable to sleep, has been up since 2:30am this morning.  Patient blames lack of sleep due to shocking type pains in her head with her headaches.  Patient was prescribed a medication for her nerves while in [place], and states we did have this on record and then all of a sudden we didn't.   Patient's referral has already been sent to Neurology, I did advise her they will be contacting her soon.  Please advise.

## 2012-11-07 NOTE — Telephone Encounter (Signed)
Patient is calling back again to inform Dr. Beverely Low, that Xanax .5mg , is what she was prescribed in [facility] for her nerves to help calm her down & sleep.  Patient insists she gave this medication information to Dr. Vassie Loll when she saw him on 08/20/2012, along with a complete list of medications she was on while in [facility].  Patient wants Dr. Beverely Low to prescribe this so she can pick up along with new rx for nasal spray.

## 2012-11-07 NOTE — Telephone Encounter (Signed)
noted 

## 2012-11-13 ENCOUNTER — Encounter: Payer: Self-pay | Admitting: Neurology

## 2012-11-13 ENCOUNTER — Ambulatory Visit (INDEPENDENT_AMBULATORY_CARE_PROVIDER_SITE_OTHER): Payer: Medicare Other | Admitting: Neurology

## 2012-11-13 VITALS — BP 135/78 | HR 99 | Ht 65.0 in | Wt 130.0 lb

## 2012-11-13 DIAGNOSIS — Z5189 Encounter for other specified aftercare: Secondary | ICD-10-CM

## 2012-11-13 DIAGNOSIS — H9313 Tinnitus, bilateral: Secondary | ICD-10-CM

## 2012-11-13 DIAGNOSIS — H9319 Tinnitus, unspecified ear: Secondary | ICD-10-CM

## 2012-11-13 DIAGNOSIS — S0990XD Unspecified injury of head, subsequent encounter: Secondary | ICD-10-CM

## 2012-11-13 MED ORDER — SERTRALINE HCL 50 MG PO TABS: 50.0000 mg | ORAL_TABLET | Freq: Every day | ORAL | Status: DC

## 2012-11-13 NOTE — Progress Notes (Signed)
Jenna Marquez is a 67 yo RH AAF referred by her PCP Dr. Beverely Marquez for evaluation of bilaterally ear popping, fullness sensation, intermittent headaches  She has lost her husband in May 2013, also other close family members in the past 2 years, she is tearful, complains of depression during today's interview  In Sept 2013, she noticed ringing in her left ear, was noticed redness in left tympanic membrane, was diagnosed with left middle ear infection,given antibiotics.  She went to [facility] for Christmas 2013, she complains of wheezing, cough,  was diagnosed with acute bronchitis, stayed in hospital in 5 days, was evaluated by her pulmonologist, was diagnosed with COPD, asthma, she was taking claritin, singular, she started to have anxiety attack, she continues to have ear discomfort, ring in both ear.  She also has shooting pain in her head, across her forehead lasting few second. She has multiple episodes in a day. She has no history of headache in the past. She complains of bifrontal pressue lasting few second, poping of he ear,   She complains of depression, scared to live alone.  She is very tearful during today's interview.  Xanax was very helpful.  She was on it since March 26th. She complains of difficulty sleeping, crying,   MRI brain March 2014 showed Minimal mucosal thickening right mastoid sinus. Minimal chronic microvascular ischemia in the white matter. No acute infarct or mass  Review of Systems  Out of a complete 14 system review, the patient complains of only the following symptoms, and all other reviewed systems are negative.   Constitutional:   N/A Cardiovascular:  N/A Ear/Nose/Throat:  Ringing in ears Skin: N/A Eyes: N/A Respiratory: N/A Gastroitestinal: N/A    Hematology/Lymphatic:  N/A Endocrine:  feeling, cold, increased thirst Musculoskeletal:N/A Allergy/Immunology: N/A Neurological: N/A Psychiatric:    N/A  PHYSICAL EXAMINATOINS:  Generalized: Tearful during today's  interview  Neck: Supple, no carotid bruits   Cardiac: Regular rate rhythm  Pulmonary: Clear to auscultation bilaterally  Musculoskeletal: No deformity  Neurological examination  Mentation: Alert oriented to time, place, history taking, and causual conversation  Cranial nerve II-XII: Pupils were equal round reactive to light extraocular movements were full, visual field were full on confrontational test. facial sensation and strength were normal. hearing was intact to finger rubbing bilaterally. Uvula tongue midline.  head turning and shoulder shrug and were normal and symmetric.Tongue protrusion into cheek strength was normal. Tympanic membranes were intact  Motor: normal tone, bulk and strength.  Sensory: Intact to fine touch, pinprick, preserved vibratory sensation, and proprioception at toes.  Coordination: Normal finger to nose, heel-to-shin bilaterally there was no truncal ataxia  Gait: Rising up from seated position without assistance, normal stance, without trunk ataxia, moderate stride, good arm swing, smooth turning, able to perform tiptoe, and heel walking without difficulty.   Romberg signs: Negative  Deep tendon reflexes: Brachioradialis 2/2, biceps 2/2, triceps 2/2, patellar 2/2, Achilles 2/2, plantar responses were flexor bilaterally.  Assessment and plan:  67 years old right-handed [place] American female, with depression, anxiety, panic attacks, complains of intermittent headaches, bilateral ear fullness sensation, essentially normal neurological examination, normal MRI of brain 1.ESR C. reactive protein to rule out temporal arteritis 2. Zoloft 50 mg every day continue Xanax as needed every night 3 return to clinic in 4 months with Jenna Marquez.,

## 2012-11-14 LAB — SEDIMENTATION RATE: Sed Rate: 3 mm/hr (ref 0–40)

## 2012-11-20 ENCOUNTER — Telehealth: Payer: Self-pay | Admitting: *Deleted

## 2012-11-20 NOTE — Telephone Encounter (Signed)
Spoke with the pt regarding her call about her MRI results.  Pt stated that she was seen yest at [facility] by Dr. Annalee Genta.  Pt stated that he requested the MRI results and he went over the results with her, and she stated that she understood.  Pt also stated that she went back to using the Nasonex b/c the other NS was dripping when she used it so she stopped using it and switched back to the Nasonex.//AB/CMA

## 2012-12-03 ENCOUNTER — Encounter: Payer: Self-pay | Admitting: Family Medicine

## 2012-12-17 ENCOUNTER — Encounter: Payer: Self-pay | Admitting: Critical Care Medicine

## 2012-12-17 ENCOUNTER — Ambulatory Visit (INDEPENDENT_AMBULATORY_CARE_PROVIDER_SITE_OTHER): Payer: Medicare Other | Admitting: Critical Care Medicine

## 2012-12-17 ENCOUNTER — Telehealth: Payer: Self-pay | Admitting: Family Medicine

## 2012-12-17 VITALS — BP 140/78 | HR 95 | Temp 97.5°F | Ht 66.0 in | Wt 129.0 lb

## 2012-12-17 DIAGNOSIS — J449 Chronic obstructive pulmonary disease, unspecified: Secondary | ICD-10-CM

## 2012-12-17 DIAGNOSIS — J4489 Other specified chronic obstructive pulmonary disease: Secondary | ICD-10-CM

## 2012-12-17 NOTE — Assessment & Plan Note (Addendum)
Chronic obstructive lung disease due to prior smoking, Gold C Ex smoker Stable at this time. Plan No change in inhaled or maintenance medications. Resume singulair and try claritin or allegra D for nasal congestion/allergic rhinitis Return in 4 months

## 2012-12-17 NOTE — Telephone Encounter (Signed)
Spoke with the pt and informed her of Dr. Rennis Golden recommendation below.  Pt agreed and scheduled an appt for CPE.//AB/CMA

## 2012-12-17 NOTE — Telephone Encounter (Signed)
LM @ (4:31pm) asking the pt to RTC.//AB/CMA 

## 2012-12-17 NOTE — Telephone Encounter (Signed)
Pt called to get a refill on her vitamin d medication and for it to be sent into [facility] on [address]. thanks

## 2012-12-17 NOTE — Patient Instructions (Addendum)
Try Claritin D or Allegra D Over the counter Resume singulair No other medication changes Return 4 months

## 2012-12-17 NOTE — Telephone Encounter (Signed)
Last OV 08-22-12 No refill date Last labs for Vit. D level:07-20-11 Please advise.//AB/CMA

## 2012-12-17 NOTE — Progress Notes (Signed)
Subjective:    Patient ID: Jenna Marquez, female    DOB: 05-Sep-1945, 67 y.o.   MRN: [id number]  HPI  67 y.o.  AAF, smoker referred for COPD AB GOLD C  Dx Copd by [place] 2010. Dx with asthma as a child. Environmental allergies per [place]: trees, mold, dust pollen, never Rx allergy shots     12/17/2012 Pt with ear issues.  Ears stopped up.  Eustachian tube is blocked.  Worse if lay flat.   Using nebulizer more.  Notes some sneezing and nose is itching.  Allergy testing done: multiple positive.  No immunotherapy.  Pt uses Astelin.(generic)  Review of Systems Constitutional:   No  weight loss, night sweats,  Fevers, chills, fatigue, lassitude. HEENT:   No headaches,  Difficulty swallowing,  Tooth/dental problems,  Sore throat,                No sneezing, itching, ear ache, nasal congestion, post nasal drip,   CV:  No chest pain,  Orthopnea, PND, swelling in lower extremities, anasarca, dizziness, palpitations  GI  No heartburn, indigestion, abdominal pain, nausea, vomiting, diarrhea, change in bowel habits, loss of appetite  Resp: Notes  shortness of breath with exertion not at rest.  No excess mucus, notes productive cough,  No non-productive cough,  No coughing up of blood.  No change in color of mucus.  No wheezing.  No chest wall deformity  Skin: no rash or lesions.  GU: no dysuria, change in color of urine, no urgency or frequency.  No flank pain.  MS:  No joint pain or swelling.  No decreased range of motion.  No back pain.  Psych:  No change in mood or affect. No depression or anxiety.  No memory loss.     Objective:   Physical Exam  Filed Vitals:   12/17/12 0906  BP: 140/78  Pulse: 95  Temp: 97.5 F (36.4 C)  TempSrc: Oral  Height: 5\' 6"  (1.676 m)  Weight: 129 lb (58.514 kg)  SpO2: 93%    Gen: Pleasant, thin in no distress,  normal affect  ENT: No lesions,  mouth clear,  oropharynx clear, no postnasal drip  Neck: No JVD, no TMG, no carotid bruits  Lungs:  No use of accessory muscles, no dullness to percussion, distant breath sounds with no wheezes  Cardiovascular: RRR, heart sounds normal, no murmur or gallops, no peripheral edema  Abdomen: soft and NT, no HSM,  BS normal  Musculoskeletal: No deformities, no cyanosis or clubbing  Neuro: alert, non focal  Skin: Warm, no lesions or rashes  No results found.      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 Chronic obstructive lung disease due to prior smoking, Gold C Ex smoker Stable at this time. Plan No change in inhaled or maintenance medications. Resume singulair and try claritin or allegra D for nasal congestion/allergic rhinitis Return in 4 months    Updated Medication List Outpatient Encounter Prescriptions as of 12/17/2012  Medication Sig Dispense Refill  . ADVAIR DISKUS 250-50 MCG/DOSE AEPB INHALE 1 PUFF INTO THE LUNGS EVERY 12 HOURS  60 each  6  . albuterol (PROVENTIL) (2.5 MG/3ML) 0.083% nebulizer solution Take 3 mLs (2.5 mg total) by nebulization every 6 (six) hours as needed. DX 496  75 mL  6  . ALPRAZolam (XANAX) 0.5 MG tablet Take 1 tablet (0.5 mg total) by mouth at bedtime as needed for sleep.  30 tablet  0  .  azelastine (ASTELIN) 137 MCG/SPRAY nasal spray Place 1 spray into the nose 2 (two) times daily. Use in each nostril as directed.  Take 1-2 sprays each nostril bid.  30 mL  12  . Calcium 500 MG CHEW Chew 1 tablet by mouth daily.       . ergocalciferol (VITAMIN D2) 50000 UNITS capsule Take 50,000 Units by mouth every 30 (thirty) days.        Marland Kitchen gabapentin (NEURONTIN) 300 MG capsule Take 300 mg by mouth 3 (three) times daily as needed.       Marland Kitchen PROVENTIL HFA 108 (90 BASE) MCG/ACT inhaler INHALE 2 PUFFS INTO THE LUNGS EVERY 6 HOURS AS NEEDED  6.7 g  0  . SPIRIVA HANDIHALER 18 MCG inhalation capsule PLACE 1 CAPSULE INTO INHALER AND INHALE DAILY  30 capsule  5  . [DISCONTINUED] sertraline (ZOLOFT) 50 MG tablet Take 1 tablet (50 mg  total) by mouth daily.  30 tablet  12  . guaiFENesin (MUCINEX) 600 MG 12 hr tablet Take 600 mg by mouth daily as needed.       . montelukast (SINGULAIR) 10 MG tablet Take 1 tablet (10 mg total) by mouth at bedtime.  30 tablet  6  . [DISCONTINUED] Azelastine-Fluticasone 137-50 MCG/ACT SUSP Place 1 spray into the nose daily.  1 Bottle  11  . [DISCONTINUED] theophylline (THEODUR) 300 MG 12 hr tablet        No facility-administered encounter medications on file as of 12/17/2012.

## 2012-12-17 NOTE — Telephone Encounter (Signed)
Pt is overdue for CPE- needs to schedule exam w/ labs prior to refill on Vit D

## 2012-12-20 ENCOUNTER — Other Ambulatory Visit: Payer: Self-pay | Admitting: Family Medicine

## 2012-12-21 NOTE — Telephone Encounter (Signed)
Last refill:11/07/12 Last CPE:07-20-11- has an appt for CPE on 01/30/13 Please advise.//AB/CMA

## 2013-01-30 ENCOUNTER — Encounter: Payer: Self-pay | Admitting: Family Medicine

## 2013-01-30 ENCOUNTER — Ambulatory Visit (INDEPENDENT_AMBULATORY_CARE_PROVIDER_SITE_OTHER): Payer: Medicare Other | Admitting: Family Medicine

## 2013-01-30 VITALS — BP 140/70 | HR 93 | Temp 97.4°F | Ht 66.0 in | Wt 127.8 lb

## 2013-01-30 DIAGNOSIS — I4510 Unspecified right bundle-branch block: Secondary | ICD-10-CM

## 2013-01-30 DIAGNOSIS — E2839 Other primary ovarian failure: Secondary | ICD-10-CM

## 2013-01-30 DIAGNOSIS — Z720 Tobacco use: Secondary | ICD-10-CM

## 2013-01-30 DIAGNOSIS — E559 Vitamin D deficiency, unspecified: Secondary | ICD-10-CM | POA: Insufficient documentation

## 2013-01-30 DIAGNOSIS — Z1211 Encounter for screening for malignant neoplasm of colon: Secondary | ICD-10-CM

## 2013-01-30 DIAGNOSIS — Z0000 Encounter for general adult medical examination without abnormal findings: Secondary | ICD-10-CM

## 2013-01-30 DIAGNOSIS — F17200 Nicotine dependence, unspecified, uncomplicated: Secondary | ICD-10-CM

## 2013-01-30 HISTORY — DX: Vitamin D deficiency, unspecified: E55.9

## 2013-01-30 LAB — CBC WITH DIFFERENTIAL/PLATELET
Basophils Absolute: 0.1 10*3/uL (ref 0.0–0.1)
Basophils Relative: 1.1 % (ref 0.0–3.0)
Eosinophils Absolute: 0.1 10*3/uL (ref 0.0–0.7)
Lymphocytes Relative: 39.5 % (ref 12.0–46.0)
MCHC: 33.5 g/dL (ref 30.0–36.0)
MCV: 107.6 fl — ABNORMAL HIGH (ref 78.0–100.0)
Monocytes Absolute: 0.5 10*3/uL (ref 0.1–1.0)
Neutro Abs: 2.2 10*3/uL (ref 1.4–7.7)
Neutrophils Relative %: 46.3 % (ref 43.0–77.0)
RBC: 3.63 Mil/uL — ABNORMAL LOW (ref 3.87–5.11)
RDW: 15.4 % — ABNORMAL HIGH (ref 11.5–14.6)

## 2013-01-30 LAB — LIPID PANEL
Cholesterol: 259 mg/dL — ABNORMAL HIGH (ref 0–200)
HDL: 122.3 mg/dL (ref >39.00)
Total CHOL/HDL Ratio: 2
Triglycerides: 122 mg/dL (ref 0.0–149.0)
VLDL: 24.4 mg/dL (ref 0.0–40.0)

## 2013-01-30 LAB — HEPATIC FUNCTION PANEL
Bilirubin, Direct: 0.1 mg/dL (ref 0.0–0.3)
Total Bilirubin: 0.9 mg/dL (ref 0.3–1.2)

## 2013-01-30 LAB — BASIC METABOLIC PANEL
BUN: 16 mg/dL (ref 6–23)
Chloride: 100 mEq/L (ref 96–112)
Creatinine, Ser: 0.9 mg/dL (ref 0.4–1.2)
GFR: 75.87 mL/min (ref >60.00)
Potassium: 3.6 mEq/L (ref 3.5–5.1)

## 2013-01-30 LAB — LDL CHOLESTEROL, DIRECT: Direct LDL: 114.2 mg/dL

## 2013-01-30 LAB — TSH: TSH: 0.94 u[IU]/mL (ref 0.35–5.50)

## 2013-01-30 NOTE — Assessment & Plan Note (Signed)
Pt would like new cardiology referral.  Was previously seeing cards at [facility].  Saw Dr Eden Emms who told her she did not need to be seen.  This made pt uncomfortable.  Will re-refer at her request.

## 2013-01-30 NOTE — Assessment & Plan Note (Signed)
Pt's PE WNL.  UTD on colonoscopy and mammo.  Due for DEXA- order placed.  Check labs.  Anticipatory guidance provided.

## 2013-01-30 NOTE — Patient Instructions (Addendum)
You have been seen at both [facility] and [facility]- since your ear symptoms continue, it will be best to call one of these offices and discuss your concerns We'll notify you of your lab results and make any changes if needed Someone will call you with your bone density appt Call with any questions or concerns Hang in there!

## 2013-01-30 NOTE — Progress Notes (Signed)
  Subjective:    Patient ID: Jenna Marquez, female    DOB: 10/21/1945, 67 y.o.   MRN: [id number]  HPI Here today for CPE.  Risk Factors: Tobacco use- quit smoking December, went through [facility] smoking cessation program.  6 months smoke free Vit D deficiency- previously on 50,000 units Vit D weekly, due for labs Physical Activity: walking regularly, was previously going to the Gym but this stopped after husband died Depression: chronic problem, pt reports mood is improving slowly Hearing: normal to conversational tones and whispered voice at 6 ft ADL's: independent Fall Risk: low risk Cognitive: normal linear thought process, memory and attention intact Home Safety: safe at home, Harlow Mares and son are staying w/ her at night Height, Weight, BMI, Visual Acuity: see vitals, vision corrected to 20/20 w/ glasses Counseling: needs referral to GI (previously went to [facility]), UTD on mammo [facility]).  Needs DEXA.  No paps. Labs Ordered: See A&P Care Plan: See A&P    Review of Systems Patient reports no vision/ hearing changes, adenopathy,fever, weight change,  persistant/recurrent hoarseness , swallowing issues, chest pain, palpitations, edema, persistant/recurrent cough, hemoptysis, dyspnea (rest/exertional/paroxysmal nocturnal), gastrointestinal bleeding (melena, rectal bleeding), abdominal pain, significant heartburn, bowel changes, GU symptoms (dysuria, hematuria, incontinence), Gyn symptoms (abnormal  bleeding, pain),  syncope, focal weakness, memory loss, numbness & tingling, skin/hair/nail changes, abnormal bruising or bleeding, anxiety, or depression.     Objective:   Physical Exam General Appearance:    Alert, cooperative, no distress, appears stated age  Head:    Normocephalic, without obvious abnormality, atraumatic  Eyes:    PERRL, conjunctiva/corneas clear, EOM's intact, fundi    benign, both eyes  Ears:    Normal TM's and external ear canals, both ears   Nose:   Nares normal, septum midline, mucosa normal, no drainage    or sinus tenderness  Throat:   Lips, mucosa, and tongue normal; teeth and gums normal  Neck:   Supple, symmetrical, trachea midline, no adenopathy;    Thyroid: no enlargement/tenderness/nodules  Back:     Symmetric, no curvature, ROM normal, no CVA tenderness  Lungs:     Clear to auscultation bilaterally, respirations unlabored  Chest Wall:    No tenderness or deformity   Heart:    Regular rate and rhythm, S1 and S2 normal, no murmur, rub   or gallop  Breast Exam:    Deferred to mammo  Abdomen:     Soft, non-tender, bowel sounds active all four quadrants,    no masses, no organomegaly  Genitalia:    Deferred  Rectal:    Extremities:   Extremities normal, atraumatic, no cyanosis or edema  Pulses:   2+ and symmetric all extremities  Skin:   Skin color, texture, turgor normal, no rashes or lesions  Lymph nodes:   Cervical, supraclavicular, and axillary nodes normal  Neurologic:   CNII-XII intact, normal strength, sensation and reflexes    throughout          Assessment & Plan:

## 2013-01-30 NOTE — Assessment & Plan Note (Signed)
Check labs.  Replete prn. 

## 2013-01-30 NOTE — Assessment & Plan Note (Signed)
Pt quit smoking 6 months ago.  Applauded her efforts.

## 2013-02-01 ENCOUNTER — Telehealth: Payer: Self-pay | Admitting: Family Medicine

## 2013-02-01 NOTE — Telephone Encounter (Signed)
Patient calling asking does she still need to take D2?  Also, please review recent labs with patient.

## 2013-02-04 ENCOUNTER — Telehealth: Payer: Self-pay | Admitting: *Deleted

## 2013-02-04 NOTE — Telephone Encounter (Signed)
Spoke with the pt and informed her of recent labs results and note.  Pt understood and agreed.  Pt said she spoke with someone on Friday about her lab results.  Pt stated that she does drink wine she keeps wine at home.  She drinks wine when she has company,when she go out to dinner,but she did not tell me how much she drinks.   Pt asked when can she have her LFT's repeated , and how long will she have to be off the wine before having the labs drawn.  Informed the pt that we repeat LFT in 6 mos to see if anything changed.  Pt stated does she have to go a mos without drinking her wine, and does she have to wait that long to have her labs repeated and if we could repeat them sooner?  Informed the pt that I spoke with Dr. Beverely Low and she stated that we can repeat the LFT's in 2 weeks,but she will have to be off the wine 2 weeks before.  Pt stated that she could not repeat labs in 2 weeks b/c she will be having company.  Informed her that Dr. Beverely Low said we could repeat labs in 1 mos,but she will need to d/c the wine for 2 weeks prior to labs.  Pt agreed to come and have repeat LFT's in 1 mos and d/c the wine for 2 weeks prior.  Pt scheduled lab appt for (Wed-03-06-13) @ 8:15am.  Future labs ordered for repeat LFT's and sent. //AB/CMA

## 2013-02-04 NOTE — Telephone Encounter (Signed)
Message copied by Verdie Shire on Mon Feb 04, 2013 10:26 AM ------      Message from: Sheliah Hatch      Created: Thu Jan 31, 2013  9:14 AM       Labs look good w/ exception of mildly elevated liver function- should hold tylenol and alcohol if taking      Cholesterol appears high but this is only b/c her HDL ( good cholesterol) is so good! ------

## 2013-02-04 NOTE — Telephone Encounter (Signed)
Spoke with the pt and informed her that her Vit D level has not come back, and we should watch until we get her results before we should say if she needs to take D2.  Pt agreed.  Also informed the pt of recent lab results(see telephone encounter).//AB/CMA

## 2013-02-12 ENCOUNTER — Telehealth: Payer: Self-pay | Admitting: *Deleted

## 2013-02-12 NOTE — Telephone Encounter (Signed)
Pt would like to know if she should be taking OTC vitamin D now since his labs are now normal.Please advise

## 2013-02-13 NOTE — Telephone Encounter (Signed)
Yes- continue once daily OTC Vit D supplement

## 2013-02-13 NOTE — Telephone Encounter (Signed)
Left Pt detail VM.

## 2013-02-19 ENCOUNTER — Ambulatory Visit: Payer: Medicare Other | Admitting: Physician Assistant

## 2013-03-05 ENCOUNTER — Encounter: Payer: Self-pay | Admitting: Internal Medicine

## 2013-03-05 ENCOUNTER — Telehealth: Payer: Self-pay | Admitting: Family Medicine

## 2013-03-05 NOTE — Telephone Encounter (Signed)
Patient is calling to request an order for a bone density to be sent to  [facility] on [address]. Fax#: [phone]

## 2013-03-06 ENCOUNTER — Other Ambulatory Visit: Payer: Medicare Other

## 2013-03-06 ENCOUNTER — Ambulatory Visit: Payer: Medicare Other | Admitting: Cardiology

## 2013-03-07 NOTE — Telephone Encounter (Signed)
LVM that referral is in the system. Orders will be faxed after MD signature.

## 2013-03-07 NOTE — Telephone Encounter (Signed)
Patient was seen 01/20/2013.  Please advise.

## 2013-03-07 NOTE — Telephone Encounter (Signed)
Order was placed on 6/18- ok to print and send wherever pt wants

## 2013-03-08 ENCOUNTER — Encounter: Payer: Self-pay | Admitting: Family Medicine

## 2013-03-11 ENCOUNTER — Emergency Department (HOSPITAL_BASED_OUTPATIENT_CLINIC_OR_DEPARTMENT_OTHER)
Admission: EM | Admit: 2013-03-11 | Discharge: 2013-03-12 | Disposition: A | Discharge status: Home / Self Care | Payer: Medicare Other | Attending: Emergency Medicine | Admitting: Emergency Medicine

## 2013-03-11 ENCOUNTER — Emergency Department (HOSPITAL_BASED_OUTPATIENT_CLINIC_OR_DEPARTMENT_OTHER): Payer: Medicare Other

## 2013-03-11 ENCOUNTER — Encounter (HOSPITAL_BASED_OUTPATIENT_CLINIC_OR_DEPARTMENT_OTHER): Payer: Self-pay

## 2013-03-11 DIAGNOSIS — J4521 Mild intermittent asthma with (acute) exacerbation: Secondary | ICD-10-CM

## 2013-03-11 DIAGNOSIS — R05 Cough: Secondary | ICD-10-CM | POA: Insufficient documentation

## 2013-03-11 DIAGNOSIS — J45901 Unspecified asthma with (acute) exacerbation: Secondary | ICD-10-CM | POA: Insufficient documentation

## 2013-03-11 DIAGNOSIS — R059 Cough, unspecified: Secondary | ICD-10-CM | POA: Insufficient documentation

## 2013-03-11 DIAGNOSIS — Z8709 Personal history of other diseases of the respiratory system: Secondary | ICD-10-CM | POA: Insufficient documentation

## 2013-03-11 DIAGNOSIS — J441 Chronic obstructive pulmonary disease with (acute) exacerbation: Secondary | ICD-10-CM | POA: Insufficient documentation

## 2013-03-11 DIAGNOSIS — J40 Bronchitis, not specified as acute or chronic: Secondary | ICD-10-CM

## 2013-03-11 DIAGNOSIS — IMO0002 Reserved for concepts with insufficient information to code with codable children: Secondary | ICD-10-CM | POA: Insufficient documentation

## 2013-03-11 DIAGNOSIS — Z87891 Personal history of nicotine dependence: Secondary | ICD-10-CM | POA: Insufficient documentation

## 2013-03-11 DIAGNOSIS — Z79899 Other long term (current) drug therapy: Secondary | ICD-10-CM | POA: Insufficient documentation

## 2013-03-11 DIAGNOSIS — H9209 Otalgia, unspecified ear: Secondary | ICD-10-CM | POA: Insufficient documentation

## 2013-03-11 MED ORDER — MOXIFLOXACIN HCL 400 MG PO TABS: 400.0000 mg | ORAL_TABLET | Freq: Every day | ORAL | Status: DC

## 2013-03-11 MED ORDER — IPRATROPIUM BROMIDE 0.02 % IN SOLN
RESPIRATORY_TRACT | Status: AC
  Administered 2013-03-11: 0.5 mg via RESPIRATORY_TRACT
  Filled 2013-03-11: qty 2.5

## 2013-03-11 MED ORDER — PREDNISONE 50 MG PO TABS
60.0000 mg | ORAL_TABLET | Freq: Once | ORAL | Status: AC
  Administered 2013-03-11: 60 mg via ORAL
  Filled 2013-03-11: qty 1

## 2013-03-11 MED ORDER — DOXYCYCLINE HYCLATE 100 MG PO CAPS: 100.0000 mg | ORAL_CAPSULE | Freq: Two times a day (BID) | ORAL | Status: DC

## 2013-03-11 MED ORDER — ALBUTEROL SULFATE (5 MG/ML) 0.5% IN NEBU
5.0000 mg | INHALATION_SOLUTION | Freq: Once | RESPIRATORY_TRACT | Status: AC
  Administered 2013-03-11: 5 mg via RESPIRATORY_TRACT
  Filled 2013-03-11: qty 1

## 2013-03-11 MED ORDER — IPRATROPIUM BROMIDE 0.02 % IN SOLN
0.5000 mg | Freq: Once | RESPIRATORY_TRACT | Status: AC
  Filled 2013-03-11: qty 2.5

## 2013-03-11 MED ORDER — ALBUTEROL SULFATE (5 MG/ML) 0.5% IN NEBU: 5.0000 mg | INHALATION_SOLUTION | Freq: Once | RESPIRATORY_TRACT | Status: AC

## 2013-03-11 MED ORDER — ALBUTEROL SULFATE (5 MG/ML) 0.5% IN NEBU
INHALATION_SOLUTION | RESPIRATORY_TRACT | Status: AC
  Administered 2013-03-11: 5 mg via RESPIRATORY_TRACT
  Filled 2013-03-11: qty 1

## 2013-03-11 MED ORDER — PREDNISONE 50 MG PO TABS: 50.0000 mg | ORAL_TABLET | Freq: Every day | ORAL | Status: DC

## 2013-03-11 MED ORDER — IPRATROPIUM BROMIDE 0.02 % IN SOLN
0.5000 mg | Freq: Once | RESPIRATORY_TRACT | Status: AC
  Administered 2013-03-11: 0.5 mg via RESPIRATORY_TRACT
  Filled 2013-03-11: qty 2.5

## 2013-03-11 NOTE — ED Notes (Signed)
MD at bedside. 

## 2013-03-11 NOTE — ED Provider Notes (Signed)
CSN: [id number]     Arrival date & time 03/11/13  1857 History  This chart was scribed for Ethelda Chick, MD by Bennett Scrape, ED Scribe. This patient was seen in room MH01/MH01 and the patient's care was started at 7:54 PM.   First MD Initiated Contact with Patient 03/11/13 1927     Chief Complaint  Patient presents with  . Shortness of Breath  . Wheezing  . Cough    Patient is a 67 y.o. female presenting with cough. The history is provided by the patient. No language interpreter was used.  Cough Cough characteristics:  Productive Sputum characteristics:  Yellow Onset quality:  Gradual Duration:  5 days Timing:  Constant Progression:  Worsening Associated symptoms: ear pain (ongoing since Christmas 2013, has appointment with ENT tomorrow), shortness of breath and wheezing   Associated symptoms: no fever     HPI Comments: Jenna Marquez is a 67 y.o. female who presents to the Emergency Department complaining of 4 to 5 days of cough productive of yellow mucus with SOB and wheezing that started today. Pt reports that she had her home cleaned with strong cleaning products around the time of the onset and the symptoms have been gradually worsening since. She states that she tried to use nebulizer treatments, 7 today, with no improvement. She received a breathing treatment in the ED with minimal improvement.   Past Medical History  Diagnosis Date  . COPD (chronic obstructive pulmonary disease)   . Asthma   . Allergic rhinitis    Past Surgical History  Procedure Laterality Date  . Ectopic pregnancy surgery    . Tonsillectomy and adenoidectomy  at age 32   Family History  Problem Relation Age of Onset  . Coronary artery disease Mother   . Heart disease Mother     CHF  . Diabetes Neg Hx   . Cancer Neg Hx   . Stroke Neg Hx    History  Substance Use Topics  . Smoking status: Former Smoker -- 1.00 packs/day for 35 years    Types: Cigarettes    Quit date: 08/10/2012  .  Smokeless tobacco: Never Used     Comment: Started at age 49  . Alcohol Use: 12.6 oz/week    21 Glasses of wine per week     Comment: socially   No OB history provided.  Review of Systems  Constitutional: Negative for fever.  HENT: Positive for ear pain (ongoing since Christmas 2013, has appointment with ENT tomorrow).   Respiratory: Positive for cough, shortness of breath and wheezing.   All other systems reviewed and are negative.    Allergies  Avelox  Home Medications   Current Outpatient Rx  Name  Route  Sig  Dispense  Refill  . ADVAIR DISKUS 250-50 MCG/DOSE AEPB      INHALE 1 PUFF INTO THE LUNGS EVERY 12 HOURS   60 each   6   . albuterol (PROVENTIL) (2.5 MG/3ML) 0.083% nebulizer solution   Nebulization   Take 3 mLs (2.5 mg total) by nebulization every 6 (six) hours as needed. DX 496   75 mL   6   . ALPRAZolam (XANAX) 0.5 MG tablet      TAKE 1 TABLET BY MOUTH AT BEDTIME AS NEEDED FOR SLEEP   30 tablet   0   . azelastine (ASTELIN) 137 MCG/SPRAY nasal spray   Nasal   Place 1 spray into the nose 2 (two) times daily. Use in each nostril as directed.  Take 1-2 sprays each nostril bid.   30 mL   12   . Calcium 500 MG CHEW   Oral   Chew 1 tablet by mouth daily.          Marland Kitchen doxycycline (VIBRAMYCIN) 100 MG capsule   Oral   Take 1 capsule (100 mg total) by mouth 2 (two) times daily. One po bid x 7 days   14 capsule   0   . ergocalciferol (VITAMIN D2) 50000 UNITS capsule   Oral   Take 50,000 Units by mouth every 30 (thirty) days.           Marland Kitchen gabapentin (NEURONTIN) 300 MG capsule   Oral   Take 300 mg by mouth 3 (three) times daily as needed.          Marland Kitchen guaiFENesin (MUCINEX) 600 MG 12 hr tablet   Oral   Take 600 mg by mouth daily as needed.          . montelukast (SINGULAIR) 10 MG tablet   Oral   Take 1 tablet (10 mg total) by mouth at bedtime.   30 tablet   6   . predniSONE (DELTASONE) 50 MG tablet   Oral   Take 1 tablet (50 mg total) by  mouth daily.   4 tablet   0   . PROVENTIL HFA 108 (90 BASE) MCG/ACT inhaler      INHALE 2 PUFFS INTO THE LUNGS EVERY 6 HOURS AS NEEDED   6.7 g   0   . SPIRIVA HANDIHALER 18 MCG inhalation capsule      PLACE 1 CAPSULE INTO INHALER AND INHALE DAILY   30 capsule   5    Triage Vitals: BP 173/71  Pulse 125  Temp(Src) 98.1 F (36.7 C) (Oral)  Resp 22  Ht 5\' 6"  (1.676 m)  Wt 128 lb (58.06 kg)  BMI 20.67 kg/m2  SpO2 98%  Physical Exam  Nursing note and vitals reviewed. Constitutional: She is oriented to person, place, and time. She appears well-developed and well-nourished. No distress.  HENT:  Head: Normocephalic and atraumatic.  Eyes: Conjunctivae and EOM are normal.  Neck: Normal range of motion. Neck supple. No tracheal deviation present.  Cardiovascular: Normal rate and regular rhythm.   No murmur heard. Pulmonary/Chest: Effort normal. No respiratory distress. She has wheezes (loud expiratory wheezes bilaterally). She has no rales.  Pt is actively coughing up mucus on exam. Breath sounds are symmetric. Speaking in full sentences     Abdominal: Soft. Bowel sounds are normal. There is no tenderness.  Musculoskeletal: Normal range of motion. She exhibits no edema (no extremity edema).  Neurological: She is alert and oriented to person, place, and time.  Skin: Skin is warm and dry.  Psychiatric: She has a normal mood and affect. Her behavior is normal.    ED Course   Procedures (including critical care time)  Medications  albuterol (PROVENTIL) (5 MG/ML) 0.5% nebulizer solution 5 mg (5 mg Nebulization Given 03/11/13 1914)  albuterol (PROVENTIL) (5 MG/ML) 0.5% nebulizer solution 5 mg (5 mg Nebulization Given 03/11/13 2011)  ipratropium (ATROVENT) nebulizer solution 0.5 mg (0.5 mg Nebulization Given 03/11/13 1914)  predniSONE (DELTASONE) tablet 60 mg (60 mg Oral Given 03/11/13 2005)  albuterol (PROVENTIL) (5 MG/ML) 0.5% nebulizer solution 5 mg (5 mg Nebulization Given 03/11/13  2330)  ipratropium (ATROVENT) nebulizer solution 0.5 mg (0.5 mg Nebulization Given 03/11/13 2330)    DIAGNOSTIC STUDIES: Oxygen Saturation is 98% on room air, normal by  my interpretation.    COORDINATION OF CARE: 7:57 PM-Discussed treatment plan which includes 2 more breathing treatments and CXR with pt at bedside and pt agreed to plan.   10:55 PM pt continues to have significant wheezing.  She is asking to be discharged- she states she is always wheezing and this is her baseline.  Will ambulate and check O2 sats.  I have discussed with her that she may need admission to the hospital and this was suggested/offered.  However patient declines admission.  I have had long discussion with her about risks of going home too early.    Labs Reviewed - No data to display  Dg Chest 2 View  03/11/2013   *RADIOLOGY REPORT*  Clinical Data: Shortness of breath and wheezing  CHEST - 2 VIEW  Comparison: September 20, 2012  Findings:  There is underlying emphysematous change.  There is no edema or consolidation.  The heart size is normal.  Pulmonary vascularity reflects underlying emphysema.  No adenopathy.  There is degenerative change in the thoracic spine. There is atherosclerotic change in the aorta.  IMPRESSION: Underlying emphysema.  No edema or consolidation.   Original Report Authenticated By: Bretta Bang, M.D.   1. Asthma exacerbation, mild intermittent   2. Bronchitis     MDM  Pt presenting with wheezing, exacerbation of her bronchitis/asthma.  Pt has had 3 treatments in the ED.  O2 sat 92% with ambulation- 97% with rest.  She was offered admission and declined stating she is back at her baseline.  Pt discharged with abx, steroids, to do frequent albuterol treatments at home.    I personally performed the services described in this documentation, which was scribed in my presence. The recorded information has been reviewed and is accurate.    Ethelda Chick, MD 03/12/13 1640

## 2013-03-11 NOTE — ED Notes (Signed)
MD at bedside discussing dispo plans.

## 2013-03-11 NOTE — ED Notes (Signed)
RRT informed of pt status.

## 2013-03-11 NOTE — ED Notes (Signed)
Pt maintained 92% O2 Sat while ambulating.  MD notified. Pt sts she would like to have the 3rd neb tx prior to DC.

## 2013-03-11 NOTE — ED Notes (Signed)
Pt reports cough that started last night and she has developed SHOB and wheezing today.

## 2013-03-13 ENCOUNTER — Encounter: Payer: Self-pay | Admitting: Physician Assistant

## 2013-03-13 ENCOUNTER — Ambulatory Visit (INDEPENDENT_AMBULATORY_CARE_PROVIDER_SITE_OTHER): Payer: Medicare Other | Admitting: Physician Assistant

## 2013-03-13 VITALS — BP 139/75 | HR 81 | Ht 66.5 in | Wt 130.0 lb

## 2013-03-13 DIAGNOSIS — I4510 Unspecified right bundle-branch block: Secondary | ICD-10-CM

## 2013-03-13 DIAGNOSIS — J449 Chronic obstructive pulmonary disease, unspecified: Secondary | ICD-10-CM

## 2013-03-13 DIAGNOSIS — R0602 Shortness of breath: Secondary | ICD-10-CM

## 2013-03-13 NOTE — Progress Notes (Signed)
[address], [address] [place], [place]  [address] Phone: [phone] Fax:  [phone]  Date:  03/13/2013   ID:  Katlynn Naser, DOB 1946/06/04, MRN [id number]  PCP:  Neena Rhymes, MD  Cardiologist:  Dr. Charlton Haws => Dr. Delton See    History of Present Illness: Jenna Marquez is a 67 y.o. female who returns for follow up on RBBB.  She has a history of COPD and RBBB on ECG. She was seen by Dr. Eden Emms for RBBB 08/2011. He felt that her RBBB was likely reflective of lung disease. Old ECGs from [facility] were requested. These are not in the chart. She did note some atypical chest pain and an ETT-echocardiogram was ordered. I do not see that this has been performed.  Echo 01/2012 (at [facility] clinic): Mild LVH, EF 50-55%, trace MR.  We spent several minutes today reviewing her dissatisfaction with seeing a PA. She was under the impression she was going to see a physician. I gave her the option of rescheduling but she chose to keep her appointment today.  She just went to the emergency room several days ago for COPD exacerbation. She's currently on prednisone and antibiotics. She denies chest pain. Her dyspnea is stable. She denies syncope. She denies orthopnea, PND or edema.  Labs (6/14):  K 3.6, Cr 0.9, ALT 24, LDL 114.2, Hgb 13.1, TSH 0.94    Wt Readings from Last 3 Encounters:  03/13/13 130 lb (58.968 kg)  03/11/13 128 lb (58.06 kg)  01/30/13 127 lb 12.8 oz (57.97 kg)     Past Medical History  Diagnosis Date  . COPD (chronic obstructive pulmonary disease)   . Asthma   . Allergic rhinitis     Current Outpatient Prescriptions  Medication Sig Dispense Refill  . ADVAIR DISKUS 250-50 MCG/DOSE AEPB INHALE 1 PUFF INTO THE LUNGS EVERY 12 HOURS  60 each  6  . albuterol (PROVENTIL) (2.5 MG/3ML) 0.083% nebulizer solution Take 3 mLs (2.5 mg total) by nebulization every 6 (six) hours as needed. DX 496  75 mL  6  . ALPRAZolam (XANAX) 0.5 MG tablet TAKE 1 TABLET BY MOUTH AT  BEDTIME AS NEEDED FOR SLEEP  30 tablet  0  . Calcium 500 MG CHEW Chew 1 tablet by mouth daily.       Marland Kitchen doxycycline (VIBRAMYCIN) 100 MG capsule Take 1 capsule (100 mg total) by mouth 2 (two) times daily. One po bid x 7 days  14 capsule  0  . gabapentin (NEURONTIN) 300 MG capsule Take 300 mg by mouth 3 (three) times daily as needed.       Marland Kitchen guaiFENesin (MUCINEX) 600 MG 12 hr tablet Take 600 mg by mouth daily as needed.       Marland Kitchen ibuprofen (ADVIL,MOTRIN) 800 MG tablet Take 800 mg by mouth every 8 (eight) hours as needed for pain.      . mometasone (NASONEX) 50 MCG/ACT nasal spray Place 2 sprays into the nose daily.      . montelukast (SINGULAIR) 10 MG tablet Take 1 tablet (10 mg total) by mouth at bedtime.  30 tablet  6  . predniSONE (DELTASONE) 50 MG tablet Take 1 tablet (50 mg total) by mouth daily.  4 tablet  0  . PROVENTIL HFA 108 (90 BASE) MCG/ACT inhaler INHALE 2 PUFFS INTO THE LUNGS EVERY 6 HOURS AS NEEDED  6.7 g  0  . SPIRIVA HANDIHALER 18 MCG inhalation capsule PLACE 1 CAPSULE INTO INHALER AND INHALE DAILY  30  capsule  5   No current facility-administered medications for this visit.    Allergies:    Allergies  Allergen Reactions  . Avelox (Moxifloxacin Hcl In Nacl)     Social History:  The patient  reports that she quit smoking about 7 months ago. Her smoking use included Cigarettes. She has a 35 pack-year smoking history. She has never used smokeless tobacco. She reports that she drinks about 12.6 ounces of alcohol per week. She reports that she does not use illicit drugs.   ROS:  Please see the history of present illness.      All other systems reviewed and negative.   PHYSICAL EXAM: VS:  BP 139/75  Pulse 81  Ht 5' 6.5" (1.689 m)  Wt 130 lb (58.968 kg)  BMI 20.67 kg/m2 Well nourished, well developed, in no acute distress HEENT: normal Neck: no JVD Vascular: No carotid bruits Cardiac:  normal S1, S2; RRR; no murmur Lungs:  Decreased breath sounds bilaterally, bilateral  rhonchi noted Abd: soft, nontender, no hepatomegaly Ext: no edema Skin: warm and dry Neuro:  CNs 2-12 intact, no focal abnormalities noted  EKG:  NSR, HR 81, incomplete RBBB, no change from prior tracing     ASSESSMENT AND PLAN:  1. RBBB: We reviewed the cardiac physiology and appearance of QRS complexes on ECG. 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 We will again request records from [place] [facility]. She does not require further testing. 2. Dyspnea:  This is mainly related to her COPD. However, she has a significant smoking history as well as family history. She has had routine stress test in the past. It has been several years. I will arrange an ETT for risk stratification. 3. COPD: Managed by pulmonary. 4. Disposition: I will make sure that her ETT is arranged with a physician and not a PA or NP. I will also make sure that she has follow up with Dr. Delton See in the future (she requested that she not see Dr. Eden Emms again).    Signed, Tereso Newcomer, PA-C  03/13/2013 9:52 AM

## 2013-03-13 NOTE — Patient Instructions (Addendum)
PLEASE SCHEDULE TO HAVE A GXT   FOLLOW UP WITH DR. Shirlee Latch IN 6 MONTHS

## 2013-03-16 ENCOUNTER — Telehealth: Payer: Self-pay | Admitting: Internal Medicine

## 2013-03-16 DIAGNOSIS — J449 Chronic obstructive pulmonary disease, unspecified: Secondary | ICD-10-CM

## 2013-03-16 MED ORDER — TIOTROPIUM BROMIDE MONOHYDRATE 18 MCG IN CAPS: ORAL_CAPSULE | RESPIRATORY_TRACT | Status: DC

## 2013-03-16 NOTE — Telephone Encounter (Signed)
Ok to refill spriva (I did it 8/1) but concerned she has a lot of coughing from her mdi's  - rec set up an appt with Tammy for med rec and consider alternative to [facility] asap

## 2013-03-18 NOTE — Telephone Encounter (Signed)
I spoke with pt and appt scheduled with TP. Nothing further was needed

## 2013-03-19 ENCOUNTER — Ambulatory Visit: Payer: Medicare Other | Admitting: Nurse Practitioner

## 2013-03-20 ENCOUNTER — Other Ambulatory Visit: Payer: Medicare Other

## 2013-03-20 ENCOUNTER — Ambulatory Visit (INDEPENDENT_AMBULATORY_CARE_PROVIDER_SITE_OTHER): Payer: Medicare Other | Admitting: Adult Health

## 2013-03-20 ENCOUNTER — Encounter: Payer: Self-pay | Admitting: Adult Health

## 2013-03-20 VITALS — BP 128/80 | HR 107 | Temp 97.1°F | Ht 66.5 in | Wt 130.6 lb

## 2013-03-20 DIAGNOSIS — J449 Chronic obstructive pulmonary disease, unspecified: Secondary | ICD-10-CM

## 2013-03-20 MED ORDER — PREDNISONE 10 MG PO TABS: ORAL_TABLET | ORAL | Status: DC

## 2013-03-20 NOTE — Assessment & Plan Note (Signed)
Reason exacerbation. Patient's medications were reviewed today and patient education was given. Computerized medication calendar was adjusted/completed  Plan  Stop Vapor E- cigs .   Finish Doxycycline .  Increase Advair Twice daily  -brush/rinse /gargle after use.  Prednisone taper over next week.  Follow med calendar closely and bring to each visit.  Follow up Dr. Delford Field  As planned and As needed   Please contact office for sooner follow up if symptoms do not improve or worsen or seek emergency care

## 2013-03-20 NOTE — Patient Instructions (Addendum)
Stop Vapor E- cigs .   Finish Doxycycline .  Increase Advair Twice daily  -brush/rinse /gargle after use.  Prednisone taper over next week.  Follow med calendar closely and bring to each visit.  Follow up Dr. Delford Field  As planned and As needed   Please contact office for sooner follow up if symptoms do not improve or worsen or seek emergency care

## 2013-03-20 NOTE — Progress Notes (Signed)
Subjective:    Patient ID: Jenna Marquez, female    DOB: 04-24-1946, 67 y.o.   MRN: [id number]  HPI  67 y.o.  AAF, ex - smoker referred for COPD AB GOLD C  Dx Copd by [place] 2010. Dx with asthma as a child. Environmental allergies per [facility]: trees, mold, dust pollen, never Rx allergy shots   12/17/2012 Pt with ear issues.  Ears stopped up.  Eustachian tube is blocked.  Worse if lay flat.   Using nebulizer more.  Notes some sneezing and nose is itching.  Allergy testing done: multiple positive.  No immunotherapy.  Pt uses Astelin.(generic) >>no changes   03/20/2013 Follow up and Med review  Patient presents for emergency room followup and medication review. Reviewed all her medications organized them into a medication calendar with patient education and appears. The patient is taking her medications correctly except for Advair, which she uses only once a day.   Went to ER on 7/28 for asthma flare . tx w/ Doxycycline and Prednisone. Did not take all of her doxycycline. CXR with COPD changes .  This started after smelled clorox cleaner.  She says, that she is feeling some better, however, continues to have some wheezing, and is having to use her albuterol nebulizer more than her usual. She is also, informed me that she is recently started using E. Cigarettes. He denies any hemoptysis, orthopnea, PND, or leg swelling    Review of Systems Constitutional:   No  weight loss, night sweats,  Fevers, chills, fatigue, lassitude. HEENT:   No headaches,  Difficulty swallowing,  Tooth/dental problems,  Sore throat,                No sneezing, itching, ear ache,  +nasal congestion, post nasal drip,   CV:  No chest pain,  Orthopnea, PND, swelling in lower extremities, anasarca, dizziness, palpitations  GI  No heartburn, indigestion, abdominal pain, nausea, vomiting, diarrhea, change in bowel habits, loss of appetite  Resp:  No chest wall deformity  Skin: no rash or lesions.  GU: no  dysuria, change in color of urine, no urgency or frequency.  No flank pain.  MS:  No joint pain or swelling.  No decreased range of motion.  No back pain.  Psych:  No change in mood or affect. No depression or anxiety.  No memory loss.     Objective:   Physical Exam  Filed Vitals:   03/20/13 1136  BP: 128/80  Pulse: 107  Temp: 97.1 F (36.2 C)  TempSrc: Oral  Height: 5' 6.5" (1.689 m)  Weight: 130 lb 9.6 oz (59.24 kg)  SpO2: 97%    Gen: Pleasant, thin in no distress,  normal affect  ENT: No lesions,  mouth clear,  oropharynx clear, no postnasal drip  Neck: No JVD, no TMG, no carotid bruits  Lungs: No use of accessory muscles, no dullness to percussion, distant breath sounds with exp wheeze   Cardiovascular: RRR, heart sounds normal, no murmur or gallops, no peripheral edema  Abdomen: soft and NT, no HSM,  BS normal  Musculoskeletal: No deformities, no cyanosis or clubbing  Neuro: alert, non focal  Skin: Warm, no lesions or rashes  No results found.      Assessment & Plan:   No problem-specific assessment & plan notes found for this encounter.   Updated Medication List Outpatient Encounter Prescriptions as of 03/20/2013  Medication Sig Dispense Refill  . ADVAIR DISKUS 250-50 MCG/DOSE AEPB INHALE 1 PUFF INTO THE LUNGS  EVERY 12 HOURS  60 each  6  . albuterol (PROVENTIL) (2.5 MG/3ML) 0.083% nebulizer solution Take 3 mLs (2.5 mg total) by nebulization every 6 (six) hours as needed. DX 496  75 mL  6  . ALPRAZolam (XANAX) 0.5 MG tablet TAKE 1 TABLET BY MOUTH AT BEDTIME AS NEEDED FOR SLEEP  30 tablet  0  . Calcium 500 MG CHEW Chew 1 tablet by mouth daily.       Marland Kitchen doxycycline (VIBRAMYCIN) 100 MG capsule Take 1 capsule (100 mg total) by mouth 2 (two) times daily. One po bid x 7 days  14 capsule  0  . gabapentin (NEURONTIN) 300 MG capsule Take 300 mg by mouth 3 (three) times daily as needed.       Marland Kitchen guaiFENesin (MUCINEX) 600 MG 12 hr tablet Take 600 mg by mouth daily as  needed.       Marland Kitchen ibuprofen (ADVIL,MOTRIN) 800 MG tablet Take 800 mg by mouth every 8 (eight) hours as needed for pain.      . mometasone (NASONEX) 50 MCG/ACT nasal spray Place 2 sprays into the nose daily.      . montelukast (SINGULAIR) 10 MG tablet Take 1 tablet (10 mg total) by mouth at bedtime.  30 tablet  6  . predniSONE (DELTASONE) 50 MG tablet Take 1 tablet (50 mg total) by mouth daily.  4 tablet  0  . PROVENTIL HFA 108 (90 BASE) MCG/ACT inhaler INHALE 2 PUFFS INTO THE LUNGS EVERY 6 HOURS AS NEEDED  6.7 g  0  . tiotropium (SPIRIVA HANDIHALER) 18 MCG inhalation capsule PLACE 1 CAPSULE INTO INHALER AND INHALE DAILY  30 capsule  5   No facility-administered encounter medications on file as of 03/20/2013.

## 2013-04-02 NOTE — Addendum Note (Signed)
Addended by: Boone Master E on: 04/02/2013 05:50 PM   Modules accepted: Orders, Medications

## 2013-04-08 ENCOUNTER — Ambulatory Visit (INDEPENDENT_AMBULATORY_CARE_PROVIDER_SITE_OTHER): Payer: Medicare Other | Admitting: Physician Assistant

## 2013-04-08 DIAGNOSIS — R0602 Shortness of breath: Secondary | ICD-10-CM

## 2013-04-08 DIAGNOSIS — R0789 Other chest pain: Secondary | ICD-10-CM

## 2013-04-08 DIAGNOSIS — I4510 Unspecified right bundle-branch block: Secondary | ICD-10-CM

## 2013-04-08 NOTE — Progress Notes (Signed)
Exercise Treadmill Test  Pre-Exercise Testing Evaluation Rhythm: normal sinus  Rate: 98     Test  Exercise Tolerance Test Ordering MD: Andreas Ohm  Interpreting MD: Tereso Newcomer, PA-C  Unique Test No: 1  Treadmill:  1  Indication for ETT: chest pain - rule out ischemia  Contraindication to ETT: No   Stress Modality: exercise - treadmill  Cardiac Imaging Performed: non   Protocol: standard Bruce - maximal  Max BP:  227/86  Max MPHR (bpm):  151 85% MPR (bpm):  128  Max HR achieved:  142 % MPHR obtained:  94  Reached 85% MPHR (min:sec):  1:26 Total Exercise Time (min-sec):  3:12  Workload in METS:  4.8 Borg Scale: 15  Reason ETT Terminated:  dyspnea    ST Segment Analysis At Rest: normal ST segments - no evidence of significant ST depression; incomplete RBBB With Exercise: no evidence of significant ST depression  Other Information Arrhythmia:  No Angina during ETT:  absent (0) Quality of ETT:  diagnostic  ETT Interpretation:  normal - no evidence of ischemia by ST analysis  Comments: Fair exercise tolerance. No chest pain. She did have significant dyspnea from COPD.  She did use prn inhaler prior to exercise.  Hypertensive BP response to exercise. No ST-T changes to suggest ischemia.   Recommendations: F/u as directed. Signed,  Tereso Newcomer, PA-C   04/08/2013 10:01 AM

## 2013-04-16 ENCOUNTER — Other Ambulatory Visit: Payer: Self-pay | Admitting: Family Medicine

## 2013-04-16 NOTE — Telephone Encounter (Signed)
Last filled: 03/13/13  Last visit: 01/30/2013  UDS- 11/07/2012-Low Risk  Contract on file  Please advise.  SW, CMA

## 2013-04-17 ENCOUNTER — Other Ambulatory Visit: Payer: Self-pay | Admitting: Pulmonary Disease

## 2013-04-24 ENCOUNTER — Telehealth: Payer: Self-pay | Admitting: *Deleted

## 2013-04-24 ENCOUNTER — Ambulatory Visit (AMBULATORY_SURGERY_CENTER): Payer: Self-pay | Admitting: *Deleted

## 2013-04-24 VITALS — Ht 66.5 in | Wt 127.6 lb

## 2013-04-24 DIAGNOSIS — Z1211 Encounter for screening for malignant neoplasm of colon: Secondary | ICD-10-CM

## 2013-04-24 MED ORDER — MOVIPREP 100 G PO SOLR: ORAL | Status: DC

## 2013-04-24 NOTE — Progress Notes (Signed)
No allergies to eggs or soy. No problems with anesthesia.  

## 2013-04-24 NOTE — Telephone Encounter (Signed)
Pt scheduled for direct colonoscopy with Dr. Marina Goodell on 05/07/13.  Last colonoscopy 10 years ago with Dr Nance Pew in [facility] [place].pt says she had polyps and was supposed to have repeat colonoscopy in 5 years but did not.  Release of information form signed and given to Alonna Buckler, CMA.

## 2013-04-26 NOTE — Telephone Encounter (Signed)
Release faxed back from Dr. Noe Gens' office with the statement:  No GI records on this patient

## 2013-04-29 ENCOUNTER — Encounter: Payer: Self-pay | Admitting: Critical Care Medicine

## 2013-04-29 ENCOUNTER — Ambulatory Visit (INDEPENDENT_AMBULATORY_CARE_PROVIDER_SITE_OTHER): Payer: Medicare Other | Admitting: Critical Care Medicine

## 2013-04-29 VITALS — BP 110/60 | HR 82 | Ht 66.0 in | Wt 128.0 lb

## 2013-04-29 DIAGNOSIS — J4489 Other specified chronic obstructive pulmonary disease: Secondary | ICD-10-CM

## 2013-04-29 DIAGNOSIS — J449 Chronic obstructive pulmonary disease, unspecified: Secondary | ICD-10-CM

## 2013-04-29 MED ORDER — UMECLIDINIUM-VILANTEROL 62.5-25 MCG/INH IN AEPB: 1.0000 | INHALATION_SPRAY | Freq: Every day | RESPIRATORY_TRACT | Status: DC

## 2013-04-29 MED ORDER — FLUTICASONE-SALMETEROL 250-50 MCG/DOSE IN AEPB: INHALATION_SPRAY | RESPIRATORY_TRACT | Status: DC

## 2013-04-29 MED ORDER — TIOTROPIUM BROMIDE MONOHYDRATE 18 MCG IN CAPS: ORAL_CAPSULE | RESPIRATORY_TRACT | Status: DC

## 2013-04-29 NOTE — Patient Instructions (Addendum)
When advair and spiriva run out , stop these medications and try ANORO one puff daily. Call us with a report on how you are doing on ANORO A Flu vaccine was given Return 3 months

## 2013-04-29 NOTE — Assessment & Plan Note (Signed)
Gold C copd Cost an issue Plan Try ANORO one puff daily and stop advair/spiriva

## 2013-04-29 NOTE — Progress Notes (Signed)
Subjective:    Patient ID: Jenna Marquez, female    DOB: [birth year], 67 y.o.   MRN: [id number]  HPI  68 y.o.  AAF, smoker referred for COPD AB GOLD C  Dx Copd by [facility] 2010. Dx with asthma as a child. Environmental allergies per [facility]: trees, mold, dust pollen, never Rx allergy shots   04/29/2013 Chief Complaint  Patient presents with  . COPD    Breathing is unchanged. Reports SOB in the AM, coughing. Denies chest tightness or wheezing.  Now not much cough, some in the AM.  Uses nasonex at night.  No real wheeze unless humidity is high.  Dyspnea is am is worse, the neb med helps  Review of Systems Constitutional:   No  weight loss, night sweats,  Fevers, chills, fatigue, lassitude. HEENT:   No headaches,  Difficulty swallowing,  Tooth/dental problems,  Sore throat,                No sneezing, itching, ear ache, nasal congestion, post nasal drip,   CV:  No chest pain,  Orthopnea, PND, swelling in lower extremities, anasarca, dizziness, palpitations  GI  No heartburn, indigestion, abdominal pain, nausea, vomiting, diarrhea, change in bowel habits, loss of appetite  Resp: Notes  shortness of breath with exertion not at rest.  No excess mucus, notes productive cough,  No non-productive cough,  No coughing up of blood.  No change in color of mucus.  No wheezing.  No chest wall deformity  Skin: no rash or lesions.  GU: no dysuria, change in color of urine, no urgency or frequency.  No flank pain.  MS:  No joint pain or swelling.  No decreased range of motion.  No back pain.  Psych:  No change in mood or affect. No depression or anxiety.  No memory loss.     Objective:   Physical Exam  Filed Vitals:   04/29/13 1023  BP: 110/60  Pulse: 82  Height: 5\' 6"  (1.676 m)  Weight: 58.06 kg (128 lb)  SpO2: 97%    Gen: Pleasant, thin in no distress,  normal affect  ENT: No lesions,  mouth clear,  oropharynx clear, no postnasal drip  Neck: No JVD, no TMG, no carotid  bruits  Lungs: No use of accessory muscles, no dullness to percussion, distant breath sounds with no wheezes  Cardiovascular: RRR, heart sounds normal, no murmur or gallops, no peripheral edema  Abdomen: soft and NT, no HSM,  BS normal  Musculoskeletal: No deformities, no cyanosis or clubbing  Neuro: alert, non focal  Skin: Warm, no lesions or rashes  No results found.      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 Gold C copd Cost an issue Plan Try ANORO one puff daily and stop advair/spiriva    Updated Medication List Outpatient Encounter Prescriptions as of 04/29/2013  Medication Sig Dispense Refill  . albuterol (PROVENTIL HFA) 108 (90 BASE) MCG/ACT inhaler Inhale 2 puffs into the lungs every 4 (four) hours as needed for wheezing or shortness of breath.       Marland Kitchen albuterol (PROVENTIL) (2.5 MG/3ML) 0.083% nebulizer solution Take 2.5 mg by nebulization every 4 (four) hours as needed for wheezing or shortness of breath. DX 496      . ALPRAZolam (XANAX) 0.5 MG tablet TAKE 1 TABLET BY MOUTH AT BEDTIME AS NEEDED FOR SLEEP  30 tablet  0  . calcium gluconate 500 MG tablet Take 500 mg by mouth daily.      Marland Kitchen  Fluticasone-Salmeterol (ADVAIR DISKUS) 250-50 MCG/DOSE AEPB STop when current supply runs out  60 each  6  . guaiFENesin (MUCINEX) 600 MG 12 hr tablet 1-2 twice daily as needed      . ibuprofen (ADVIL,MOTRIN) 800 MG tablet Take 800 mg by mouth every 8 (eight) hours as needed for pain.      . mometasone (NASONEX) 50 MCG/ACT nasal spray Place 2 sprays into the nose at bedtime.       . Multiple Vitamin (MULTIVITAMIN) tablet Take 1 tablet by mouth every morning.      . Sodium Chloride-Sodium Bicarb (AYR SALINE NASAL NETI RINSE NA) Rinse as needed for nasal congestion      . tiotropium (SPIRIVA HANDIHALER) 18 MCG inhalation capsule PLACE 1 CAPSULE INTO INHALER AND INHALE DAILY STOP when current supply runs out  30 capsule  5  . vitamin C (ASCORBIC  ACID) 500 MG tablet Take 500 mg by mouth every morning.      . [DISCONTINUED] ADVAIR DISKUS 250-50 MCG/DOSE AEPB INHALE 1 PUFF INTO THE LUNGS EVERY 12 HOURS  60 each  6  . [DISCONTINUED] montelukast (SINGULAIR) 10 MG tablet Take 1 tablet (10 mg total) by mouth at bedtime.  30 tablet  6  . [DISCONTINUED] tiotropium (SPIRIVA HANDIHALER) 18 MCG inhalation capsule PLACE 1 CAPSULE INTO INHALER AND INHALE DAILY  30 capsule  5  . Umeclidinium-Vilanterol (ANORO ELLIPTA) 62.5-25 MCG/INH AEPB Inhale 1 puff into the lungs daily.  60 each  6  . [DISCONTINUED] fexofenadine (ALLEGRA) 180 MG tablet Take 180 mg by mouth every morning.      . [DISCONTINUED] MOVIPREP 100 G SOLR moviprep as directed. No substitutions  1 kit  0   No facility-administered encounter medications on file as of 04/29/2013.

## 2013-05-07 ENCOUNTER — Encounter: Payer: Self-pay | Admitting: Internal Medicine

## 2013-05-07 ENCOUNTER — Ambulatory Visit (AMBULATORY_SURGERY_CENTER): Payer: Medicare Other | Admitting: Internal Medicine

## 2013-05-07 VITALS — BP 153/74 | HR 86 | Temp 97.1°F | Resp 21 | Ht 66.5 in | Wt 127.0 lb

## 2013-05-07 DIAGNOSIS — Z86010 Personal history of colonic polyps: Secondary | ICD-10-CM

## 2013-05-07 DIAGNOSIS — D126 Benign neoplasm of colon, unspecified: Secondary | ICD-10-CM

## 2013-05-07 DIAGNOSIS — Z1211 Encounter for screening for malignant neoplasm of colon: Secondary | ICD-10-CM

## 2013-05-07 MED ORDER — SODIUM CHLORIDE 0.9 % IV SOLN: 500.0000 mL | INTRAVENOUS | Status: DC

## 2013-05-07 NOTE — Progress Notes (Signed)
Pt HOB lower and feet up pt did pass moderate amount of gas.  Her abdomin did soften. Maw

## 2013-05-07 NOTE — Patient Instructions (Addendum)
YOU HAD AN ENDOSCOPIC PROCEDURE TODAY AT THE [facility]: Refer to the procedure report that was given to you for any specific questions about what was found during the examination.  If the procedure report does not answer your questions, please call your gastroenterologist to clarify.  If you requested that your care partner not be given the details of your procedure findings, then the procedure report has been included in a sealed envelope for you to review at your convenience later.  YOU SHOULD EXPECT: Some feelings of bloating in the abdomen. Passage of more gas than usual.  Walking can help get rid of the air that was put into your GI tract during the procedure and reduce the bloating. If you had a lower endoscopy (such as a colonoscopy or flexible sigmoidoscopy) you may notice spotting of blood in your stool or on the toilet paper. If you underwent a bowel prep for your procedure, then you may not have a normal bowel movement for a few days.  DIET: Your first meal following the procedure should be a light meal and then it is ok to progress to your normal diet.  A half-sandwich or bowl of soup is an example of a good first meal.  Heavy or fried foods are harder to digest and may make you feel nauseous or bloated.  Likewise meals heavy in dairy and vegetables can cause extra gas to form and this can also increase the bloating.  Drink plenty of fluids but you should avoid alcoholic beverages for 24 hours.  ACTIVITY: Your care partner should take you home directly after the procedure.  You should plan to take it easy, moving slowly for the rest of the day.  You can resume normal activity the day after the procedure however you should NOT DRIVE or use heavy machinery for 24 hours (because of the sedation medicines used during the test).    SYMPTOMS TO REPORT IMMEDIATELY: A gastroenterologist can be reached at any hour.  During normal business hours, 8:30 AM to 5:00 PM Monday through Friday,  call [phone].  After hours and on weekends, please call the GI answering service at [phone] who will take a message and have the physician on call contact you.   Following lower endoscopy (colonoscopy or flexible sigmoidoscopy):  Excessive amounts of blood in the stool  Significant tenderness or worsening of abdominal pains  Swelling of the abdomen that is new, acute  Fever of 100F or higher    FOLLOW UP: If any biopsies were taken you will be contacted by phone or by letter within the next 1-3 weeks.  Call your gastroenterologist if you have not heard about the biopsies in 3 weeks.  Our staff will call the home number listed on your records the next business day following your procedure to check on you and address any questions or concerns that you may have at that time regarding the information given to you following your procedure. This is a courtesy call and so if there is no answer at the home number and we have not heard from you through the emergency physician on call, we will assume that you have returned to your regular daily activities without incident.  SIGNATURES/CONFIDENTIALITY: You and/or your care partner have signed paperwork which will be entered into your electronic medical record.  These signatures attest to the fact that that the information above on your After Visit Summary has been reviewed and is understood.  Full responsibility of the confidentiality   of this discharge information lies with you and/or your care-partner.    Handouts were given to your care partner on diverticulosis, a high fiber diet and polyps. You may resume your current medications today. Please call if any questions or concerns.    

## 2013-05-07 NOTE — Progress Notes (Signed)
Called to room to assist during endoscopic procedure.  Patient ID and intended procedure confirmed with present staff. Received instructions for my participation in the procedure from the performing physician.  

## 2013-05-07 NOTE — Progress Notes (Signed)
Pt c/o abdominal pain.  Two sublingual levsin given to the pt.  Pt abdomin was massaged but still no gas passed.  Rectal tube was lubricated and place gentally in her rectum with the pt's permission.  Maw

## 2013-05-07 NOTE — Progress Notes (Signed)
Pt taken off monitor and Darlyn Read, RN took the pt to the restroom to sit on the toilet to pass more gas. Maw

## 2013-05-07 NOTE — Progress Notes (Signed)
Dr. Marina Goodell in to assess the pt.  He said she was cleared to be discharged.  maw

## 2013-05-07 NOTE — Op Note (Signed)
[facility] [address] [place], [address]   COLONOSCOPY PROCEDURE REPORT  PATIENT: Jenna, Marquez  MR#: [id number] BIRTHDATE: 1946-07-21 , 67  yrs. old GENDER: Female ENDOSCOPIST: Roxy Cedar, MD REFERRED XB:[id number] Assunta Found, M.D. PROCEDURE DATE:  05/07/2013 PROCEDURE:   Colonoscopy with snare polypectomy x 2 First Screening Colonoscopy - Avg.  risk and is 50 yrs.  old or older - No.  Prior Negative Screening - Now for repeat screening. N/A  History of Adenoma - Now for follow-up colonoscopy & has been > or = to 3 yrs.  N/A  Polyps Removed Today? Yes. ASA CLASS:   Class II INDICATIONS:Patient's personal history of colon polyps.   Dr Noe Gens 10 years ago . Told to f/u 5 yrs ago. records requested, not received MEDICATIONS: MAC sedation, administered by CRNA and propofol (Diprivan) 270mg  IV  DESCRIPTION OF PROCEDURE:   After the risks benefits and alternatives of the procedure were thoroughly explained, informed consent was obtained.  A digital rectal exam revealed no abnormalities of the rectum.   The LB [id number]  endoscope was introduced through the anus and advanced to the cecum, which was identified by both the appendix and ileocecal valve. No adverse events experienced.   The quality of the prep was adequate, using MoviPrep  The instrument was then slowly withdrawn as the colon was fully examined.  COLON FINDINGS: Two diminutive polyps were found in the transverse colon.  A polypectomy was performed with a cold snare.  The resection was complete and the polyp tissue was completely retrieved.   Severe diverticulosis was noted throughout the entire examined colon.   The colon mucosa was otherwise normal. Retroflexed views revealed internal hemorrhoids. The time to cecum=4 minutes 09 seconds.  Withdrawal time=9 minutes 42 seconds. The scope was withdrawn and the procedure completed. COMPLICATIONS: There were no complications.  ENDOSCOPIC  IMPRESSION: 1.   Two diminutive polyps were found in the transverse colon; polypectomy was performed with a cold snare 2.   Severe diverticulosis was noted throughout the entire examined colon 3.   The colon mucosa was otherwise normal  RECOMMENDATIONS: Follow up colonoscopy in 5 years   eSigned:  Roxy Cedar, MD 05/07/2013 10:50 AM   cc: Sheliah Hatch, MD and The Patient

## 2013-05-07 NOTE — Progress Notes (Signed)
Preop, vss, bbs emphysemic, rhonchi and coughing... She took 3 puffs of her Rx albuterol. Post op, vss, bbs= and same as preop, report to pacu RN, A+Ox3

## 2013-05-07 NOTE — Progress Notes (Signed)
Celia Assisted pt in the restroom with dressing.  The pt will be seated in the conference room and Dr. Marina Goodell in the assess the pt before discharge.  Pt is aware. Maw

## 2013-05-08 ENCOUNTER — Telehealth: Payer: Self-pay | Admitting: *Deleted

## 2013-05-08 NOTE — Telephone Encounter (Signed)
  Follow up Call-  Call back number 05/07/2013  Post procedure Call Back phone  # [phone]  Permission to leave phone message Yes     Patient questions:  Do you have a fever, pain , or abdominal swelling? no Pain Score  0 *  Have you tolerated food without any problems? yes  Have you been able to return to your normal activities? yes  Do you have any questions about your discharge instructions: Diet   no Medications  no Follow up visit  no  Do you have questions or concerns about your Care? no  Actions: * If pain score is 4 or above: No action needed, pain <4.

## 2013-05-13 ENCOUNTER — Encounter: Payer: Self-pay | Admitting: Internal Medicine

## 2013-05-29 ENCOUNTER — Telehealth: Payer: Self-pay | Admitting: *Deleted

## 2013-05-29 NOTE — Telephone Encounter (Signed)
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 Also states that she may have to fly to [place] to help a family member and she wanted to know if it is okay for her to fly. Please advise SW

## 2013-05-29 NOTE — Telephone Encounter (Signed)
Yes, she can fly.  Yes, she can take Ibuprofen 800mg  TID (w/ food) as long as she's not taking other anti-inflammatories (aleve, motrin, advil, etc)

## 2013-05-29 NOTE — Telephone Encounter (Signed)
Pt.notified

## 2013-06-07 ENCOUNTER — Ambulatory Visit: Payer: Medicare Other | Admitting: Pulmonary Disease

## 2013-06-07 ENCOUNTER — Telehealth: Payer: Self-pay | Admitting: Critical Care Medicine

## 2013-06-07 ENCOUNTER — Encounter: Payer: Self-pay | Admitting: Internal Medicine

## 2013-06-07 ENCOUNTER — Ambulatory Visit (INDEPENDENT_AMBULATORY_CARE_PROVIDER_SITE_OTHER): Payer: Medicare Other | Admitting: Internal Medicine

## 2013-06-07 VITALS — BP 148/70 | HR 98 | Temp 98.2°F | Ht 66.5 in | Wt 130.6 lb

## 2013-06-07 DIAGNOSIS — J449 Chronic obstructive pulmonary disease, unspecified: Secondary | ICD-10-CM

## 2013-06-07 MED ORDER — PREDNISONE (PAK) 10 MG PO TABS: ORAL_TABLET | ORAL | Status: DC

## 2013-06-07 MED ORDER — ALBUTEROL SULFATE (2.5 MG/3ML) 0.083% IN NEBU: 2.5000 mg | INHALATION_SOLUTION | RESPIRATORY_TRACT | Status: DC | PRN

## 2013-06-07 MED ORDER — BUDESONIDE-FORMOTEROL FUMARATE 160-4.5 MCG/ACT IN AERO: INHALATION_SPRAY | RESPIRATORY_TRACT | Status: DC

## 2013-06-07 NOTE — Telephone Encounter (Signed)
Called and spoke with pt and she stated that she has been coughing with thick grey sputum, increase SOB with moving around, sweating/chills, wheezing, rescue inhaler has not been helping and she has been using it more than normal and her nebs dont seem to be helping. She has been using the anoro but she stated that now she has diarrhea.  appt has been scheduled for the pt today with RA at 2.  Pt is aware.

## 2013-06-07 NOTE — Patient Instructions (Addendum)
Stop anoro  Prednisone 10 mg take  4 each am x 2 days,   2 each am x 2 days,  1 each am x 2 days and stop   Plan A = automatic =symbicort 160 Take 2 puffs first thing in am and then another 2 puffs about 12 hours later.    Plan B Only use your albuterol (proventil) as a rescue medication to be used if you can't catch your breath by resting or doing a relaxed purse lip breathing pattern.  - The less you use it, the better it will work when you need it. - Ok to use up to every 4 hours if you must but call for immediate appointment if use goes up over your usual need - Don't leave home without it !!  (think of it like your spare tire for your car)   Plan C only use if B fails > nebulizer Albuterol 2.5 mg every 4 hours if needed   For cough> mucinex dm 1200 mg every 12 hours as per calendar

## 2013-06-07 NOTE — Progress Notes (Signed)
  Subjective:    Patient ID: Jenna Marquez, female    DOB: 11/22/1945, 67 y.o.   MRN: [id number]  HPI  67 y.o.  AAF, smoker referred for COPD AB GOLD C  Dx Copd by [place] 2010. Dx with asthma as a child. Environmental allergies per [place]: trees, mold, dust pollen, never Rx allergy shots   04/29/2013 Chief Complaint  Patient presents with  . COPD    Breathing is unchanged. Reports SOB in the AM, coughing. Denies chest tightness or wheezing.  Now not much cough, some in the AM.  Uses nasonex at night.  No real wheeze unless humidity is high.  Dyspnea is am is worse, the neb med helps rec When advair and spiriva run out , stop these medications and try ANORO one puff daily. Call us with a report on how you are doing on [facility]  06/07/2013 acute  ov/Kvon [place]il[place] re: cough > sob  s/p smoking cessation x  07/2012  Chief Complaint  Patient presents with  . Acute Visit    Pt c/o increased cough x 2 wks- prod with minimal thick, grey to white sputum.  She also c/o increased SOB and wheezing for the past several days. Using albuterol inhaler and neb every 2 hrs as of today.    not following med calendar at  All and much worse on anoro with persistent day > noct cough with sob with min activity x w/in an hour of albuterol. Had been using the neb qid as maintenance"for a long time"  No obvious day to day or daytime variabilty or assoc  cp or chest tightness, subjective wheeze overt sinus or hb symptoms. No unusual exp hx or h/o childhood pna/ asthma or knowledge of premature birth.  Also denies any obvious fluctuation of symptoms with weather or environmental changes or other aggravating or alleviating factors except as outlined above   Current Medications, Allergies, Complete Past Medical History, Past Surgical History, Family History, and Social History were reviewed in [employer] record.  ROS  The following are not active complaints unless bolded sore throat, dysphagia,  dental problems, itching, sneezing,  nasal congestion or excess/ purulent secretions, ear ache,   fever, chills, sweats, unintended wt loss, pleuritic or exertional cp, hemoptysis,  orthopnea pnd or leg swelling, presyncope, palpitations, heartburn, abdominal pain, anorexia, nausea, vomiting, diarrhea  or change in bowel or urinary habits, change in stools or urine, dysuria,hematuria,  rash, arthralgias, visual complaints, headache, numbness weakness or ataxia or problems with walking or coordination,  change in mood/affect or memory.              Objective:   Physical Exam   Wt Readings from Last 3 Encounters:  06/07/13 130 lb 9.6 oz (59.24 kg)  05/07/13 127 lb (57.607 kg)  04/29/13 128 lb (58.06 kg)      Pleasant but hoarse amb bf nad HEENT mild turbinate edema.  Oropharynx no thrush or excess pnd or cobblestoning.  No JVD or cervical adenopathy. Mild accessory muscle hypertrophy. Trachea midline, nl thryroid. Chest was hyperinflated by percussion with diminished breath sounds and moderate increased exp time without wheeze. Hoover sign positive at mid inspiration. Regular rate and rhythm without murmur gallop or rub or increase P2 or edema.  Abd: no hsm, nl excursion. Ext warm without cyanosis or clubbing.    cxr 03/11/13 Underlying emphysema. No edema or consolidation.             Assessment & Plan:

## 2013-06-08 NOTE — Assessment & Plan Note (Addendum)
[facility] Stage C Copd  FeV1 49% Fef 25 75 22%  09/2010 Active tobacco use 03/2011>>>reduced to 2cig/d 11/12>>still smoking 1/2 PPD 04/02/2012>>> no smoking since January 2014 - 03/16/2013 report of refractory cough on advair and spiriva > rec f/u asap with Tammy NP to consider alternatives 03/20/2013 med calendar> not using as of 06/08/13  DDX of  difficult airways managment all start with A and  include Adherence, Ace Inhibitors, Acid Reflux, Active Sinus Disease, Alpha 1 Antitripsin deficiency, Anxiety masquerading as Airways dz,  ABPA,  allergy(esp in young), Aspiration (esp in elderly), Adverse effects of DPI,  Active smokers, plus two Bs  = Bronchiectasis and Beta blocker use..and one C= CHF   Adherence is always the initial "prime suspect" and is a multilayered concern that requires a "trust but verify" approach in every patient - starting with knowing how to use medications, especially inhalers, correctly, keeping up with refills and understanding the fundamental difference between maintenance and prns vs those medications only taken for a very short course and then stopped and not refilled. The proper method of use, as well as anticipated side effects, of a metered-dose inhaler are discussed and demonstrated to the patient. Improved effectiveness after extensive coaching during this visit to a level of approximately  75%   ? Adverse effects of dpi with lots of cough > sob > try off anoro and on symbicort 160 2bid    Each maintenance medication was reviewed in detail including most importantly the difference between maintenance and as needed and under what circumstances the prns are to be used. This was done in the context of creating a new updated medication calendar review which provided the patient with a user-friendly unambiguous mechanism for medication administration and reconciliation and provides an action plan for all active problems. It is critical that this be shown to every doctor  for  modification during the office visit if necessary so the patient can use it as a working document.      If not effective consider change to laba/ics neb and rx empirically with gerd rx/ eval sinus dz with sinus ct

## 2013-06-11 ENCOUNTER — Encounter: Payer: Self-pay | Admitting: Family Medicine

## 2013-06-17 NOTE — Telephone Encounter (Signed)
Pt called

## 2013-08-10 ENCOUNTER — Emergency Department (HOSPITAL_BASED_OUTPATIENT_CLINIC_OR_DEPARTMENT_OTHER)
Admission: EM | Admit: 2013-08-10 | Discharge: 2013-08-10 | Disposition: A | Discharge status: Home / Self Care | Payer: Medicare Other | Attending: Emergency Medicine | Admitting: Emergency Medicine

## 2013-08-10 ENCOUNTER — Emergency Department (HOSPITAL_BASED_OUTPATIENT_CLINIC_OR_DEPARTMENT_OTHER): Payer: Medicare Other

## 2013-08-10 DIAGNOSIS — R110 Nausea: Secondary | ICD-10-CM

## 2013-08-10 DIAGNOSIS — R0789 Other chest pain: Secondary | ICD-10-CM

## 2013-08-10 DIAGNOSIS — R61 Generalized hyperhidrosis: Secondary | ICD-10-CM | POA: Insufficient documentation

## 2013-08-10 DIAGNOSIS — Z87891 Personal history of nicotine dependence: Secondary | ICD-10-CM | POA: Insufficient documentation

## 2013-08-10 DIAGNOSIS — Z79899 Other long term (current) drug therapy: Secondary | ICD-10-CM | POA: Insufficient documentation

## 2013-08-10 DIAGNOSIS — IMO0002 Reserved for concepts with insufficient information to code with codable children: Secondary | ICD-10-CM | POA: Insufficient documentation

## 2013-08-10 DIAGNOSIS — I491 Atrial premature depolarization: Secondary | ICD-10-CM

## 2013-08-10 DIAGNOSIS — J441 Chronic obstructive pulmonary disease with (acute) exacerbation: Secondary | ICD-10-CM | POA: Insufficient documentation

## 2013-08-10 DIAGNOSIS — R42 Dizziness and giddiness: Secondary | ICD-10-CM | POA: Insufficient documentation

## 2013-08-10 DIAGNOSIS — K921 Melena: Secondary | ICD-10-CM | POA: Insufficient documentation

## 2013-08-10 DIAGNOSIS — R112 Nausea with vomiting, unspecified: Secondary | ICD-10-CM | POA: Insufficient documentation

## 2013-08-10 LAB — BASIC METABOLIC PANEL WITH GFR
Chloride: 98 meq/L (ref 96–112)
GFR calc Af Amer: 52 mL/min — ABNORMAL LOW (ref >90)

## 2013-08-10 LAB — BASIC METABOLIC PANEL
BUN: 12 mg/dL (ref 6–23)
CO2: 25 mEq/L (ref 19–32)
Calcium: 10.1 mg/dL (ref 8.4–10.5)
Creatinine, Ser: 1.2 mg/dL — ABNORMAL HIGH (ref 0.50–1.10)
GFR calc non Af Amer: 45 mL/min — ABNORMAL LOW (ref >90)
Glucose, Bld: 150 mg/dL — ABNORMAL HIGH (ref 70–99)
Potassium: 4 mEq/L (ref 3.5–5.1)
Sodium: 138 mEq/L (ref 135–145)

## 2013-08-10 LAB — CBC
HCT: 39.2 % (ref 36.0–46.0)
Hemoglobin: 13.5 g/dL (ref 12.0–15.0)
MCH: 33.9 pg (ref 26.0–34.0)
MCHC: 34.4 g/dL (ref 30.0–36.0)
MCV: 98.5 fL (ref 78.0–100.0)
Platelets: 271 K/uL (ref 150–400)
RBC: 3.98 MIL/uL (ref 3.87–5.11)
RDW: 12.8 % (ref 11.5–15.5)
WBC: 4.4 K/uL (ref 4.0–10.5)

## 2013-08-10 LAB — OCCULT BLOOD X 1 CARD TO LAB, STOOL: Fecal Occult Bld: NEGATIVE

## 2013-08-10 LAB — TROPONIN I: Troponin I: <0.3 ng/mL (ref <0.30)

## 2013-08-10 MED ORDER — ONDANSETRON HCL 4 MG/2ML IJ SOLN
4.0000 mg | Freq: Once | INTRAMUSCULAR | Status: AC
  Administered 2013-08-10: 4 mg via INTRAVENOUS
  Filled 2013-08-10: qty 2

## 2013-08-10 MED ORDER — ONDANSETRON HCL 4 MG PO TABS: 4.0000 mg | ORAL_TABLET | Freq: Four times a day (QID) | ORAL | Status: DC

## 2013-08-10 MED ORDER — SODIUM CHLORIDE 0.9 % IV BOLUS (SEPSIS)
1000.0000 mL | Freq: Once | INTRAVENOUS | Status: AC
  Administered 2013-08-10: 1000 mL via INTRAVENOUS

## 2013-08-10 NOTE — ED Provider Notes (Signed)
CSN: [id number]     Arrival date & time 08/10/13  1612 History  This chart was scribed for Jenna Marquez. Oletta Lamas, MD by Dorothey Baseman, ED Scribe. This patient was seen in room MH11/MH11 and the patient's care was started at 5:21 PM.    Chief Complaint  Patient presents with  . Chest Pain   The history is provided by the patient. No language interpreter was used.   HPI Comments: Jenna Marquez is a 67 y.o. Female with a history of COPD and asthma who presents to the Emergency Department complaining of chest tightness with associated shortness of breath onset about 2 hours ago that began with feeling some fast, pounding palpitations around 3.5 hours ago after she reports using 2 puffs of her albuterol inhaler. Patient reports that these symptoms have been gradually improving. She reports some associated intermittent dizziness and diaphoresis that is worse at night for the past few days. Patient reports some associated persistent nausea and 1 episode of emesis last night. She reports that she has otherwise been tolerating food and liquids well. Patient also reports 1 episode of melena this morning, about 6.5 hours ago. She reports that her last BM yesterday was normal. She states that she had some diarrhea last week that has since resolved. Patient reports that she recently had a stress test at [facility] that was normal. She denies abdominal pain. Patient denies history of GI bleeds.    Patient is also complaining of a small wound to the right thumb that she states she has had for about a month, but that she does not remember how she sustained it. She states that she has been applying neosporin and keeping the area covered with a band-aid, but that it does not appear to be healing.   Past Medical History  Diagnosis Date  . COPD (chronic obstructive pulmonary disease)   . Asthma   . Allergic rhinitis    Past Surgical History  Procedure Laterality Date  . Ectopic pregnancy surgery  1974  . Tonsillectomy  and adenoidectomy  1954   Family History  Problem Relation Age of Onset  . Coronary artery disease Mother   . Heart disease Mother     CHF  . Diabetes Neg Hx   . Cancer Neg Hx   . Stroke Neg Hx   . Colon cancer Neg Hx    History  Substance Use Topics  . Smoking status: Former Smoker -- 1.00 packs/day for 35 years    Types: Cigarettes    Quit date: 08/10/2012  . Smokeless tobacco: Never Used     Comment: Started at age 48  . Alcohol Use: 12.6 oz/week    21 Glasses of wine per week     Comment: socially   OB History   Grav Para Term Preterm Abortions TAB SAB Ect Mult Living                 Review of Systems  A complete 10 system review of systems was obtained and all systems are negative except as noted in the HPI and PMH.   Allergies  Avelox  Home Medications   Current Outpatient Rx  Name  Route  Sig  Dispense  Refill  . albuterol (PROVENTIL HFA) 108 (90 BASE) MCG/ACT inhaler   Inhalation   Inhale 2 puffs into the lungs every 4 (four) hours as needed for wheezing or shortness of breath.          Marland Kitchen albuterol (PROVENTIL) (2.5 MG/3ML) 0.083%  nebulizer solution   Nebulization   Take 3 mLs (2.5 mg total) by nebulization every 4 (four) hours as needed for wheezing or shortness of breath. DX 496   75 mL   2   . ALPRAZolam (XANAX) 0.5 MG tablet      TAKE 1 TABLET BY MOUTH AT BEDTIME AS NEEDED FOR SLEEP   30 tablet   0   . budesonide-formoterol (SYMBICORT) 160-4.5 MCG/ACT inhaler      Take 2 puffs first thing in am and then another 2 puffs about 12 hours later.   1 Inhaler   12   . Calcium Carb-Cholecalciferol (CALTRATE 600+D SOFT) 600-800 MG-UNIT CHEW   Oral   Chew 1 tablet by mouth 2 (two) times daily.         Marland Kitchen guaiFENesin (MUCINEX) 600 MG 12 hr tablet      1-2 twice daily as needed         . ibuprofen (ADVIL,MOTRIN) 800 MG tablet   Oral   Take 800 mg by mouth every 8 (eight) hours as needed for pain.         . mometasone (NASONEX) 50 MCG/ACT  nasal spray   Nasal   Place 2 sprays into the nose at bedtime.          . Multiple Vitamin (MULTIVITAMIN) tablet   Oral   Take 1 tablet by mouth every morning.         . ondansetron (ZOFRAN) 4 MG tablet   Oral   Take 1 tablet (4 mg total) by mouth every 6 (six) hours.   15 tablet   0   . predniSONE (STERAPRED UNI-PAK) 10 MG tablet      Prednisone 10 mg take  4 each am x 2 days,   2 each am x 2 days,  1 each am x2days and stop   14 tablet   0   . Sodium Chloride-Sodium Bicarb (AYR SALINE NASAL NETI RINSE NA)      Rinse as needed for nasal congestion         . vitamin C (ASCORBIC ACID) 500 MG tablet   Oral   Take 500 mg by mouth every morning.          Triage Vitals: BP 157/65  Pulse 89  Temp(Src) 98.1 F (36.7 C) (Oral)  Resp 20  Ht 5\' 6"  (1.676 m)  Wt 130 lb (58.968 kg)  BMI 20.99 kg/m2  SpO2 98%  Physical Exam  Nursing note and vitals reviewed. Constitutional: She is oriented to person, place, and time. She appears well-developed and well-nourished. No distress.  HENT:  Head: Normocephalic and atraumatic.  Eyes: Conjunctivae are normal.  Neck: Normal range of motion. Neck supple.  Cardiovascular: Normal rate.   No murmur heard. Some ectopic heartbeats.   Pulmonary/Chest: Effort normal and breath sounds normal. No respiratory distress. She has no wheezes.  Abdominal: Soft. She exhibits no distension.  Musculoskeletal: Normal range of motion.  Right thumb has area just under nail that has peeling dry skin around a small laceration that is continuing to heal.  No erythema, discharge, minimally tender. No induration or area of fluctaunce  Neurological: She is alert and oriented to person, place, and time.  Skin: Skin is warm and dry.  Psychiatric: She has a normal mood and affect. Her behavior is normal.    ED Course  Procedures (including critical care time)  DIAGNOSTIC STUDIES: Oxygen Saturation is 98% on room air, normal by my interpretation.  COORDINATION OF CARE: 5:29 PM- Ordered a chest x-ray and blood labs (CBC, basic metabolic panel, troponin). Will order Zofran and IV fluids to manage symptoms. Advised patient to apply topical anti-fungal treatments to the thumb to facilitate healing. Discussed treatment plan with patient at bedside and patient verbalized agreement.   8:32 PM- Patient reports feeling much better after receiving the medications. Discussed that x-ray and lab results were normal. Discussed that symptoms are likely viral in nature and will resolve on its own in a few days. Discussed treatment plan with patient at bedside and patient verbalized agreement.    Labs Review Labs Reviewed  BASIC METABOLIC PANEL - Abnormal; Notable for the following:    Glucose, Bld 150 (*)    Creatinine, Ser 1.20 (*)    GFR calc non Af Amer 45 (*)    GFR calc Af Amer 52 (*)    All other components within normal limits  CBC  TROPONIN I  OCCULT BLOOD X 1 CARD TO LAB, STOOL   Imaging Review Dg Chest Portable 1 View  08/10/2013   CLINICAL DATA:  Chest tightness.  Short of breath.  EXAM: PORTABLE CHEST - 1 VIEW  COMPARISON:  03/11/2013  FINDINGS: Normal heart size. Clear lungs. No pneumothorax. No pleural effusion.  IMPRESSION: No active disease.   Electronically Signed   By: Maryclare Bean M.D.   On: 08/10/2013 17:11    EKG Interpretation    Date/Time:  Saturday August 10 2013 16:20:39 EST Ventricular Rate:  95 PR Interval:  138 QRS Duration: 96 QT Interval:  356 QTC Calculation: 447 R Axis:   61 Text Interpretation:  Sinus rhythm with Premature supraventricular complexes RSR' or QR pattern in V1 suggests right ventricular conduction delay Borderline ECG criteria for IRBBB no longer seen , otherwise PAC's are new and no other sig changes compared to prior ECG on  May 2013 Confirmed by [facility]  MD, Russella Dar [phone]) on 08/10/2013 4:34:10 PM            MDM   1. Nausea   2. Chest tightness   3. PAC (premature atrial  contraction)      I personally performed the services described in this documentation, which was scribed in my presence. The recorded information has been reviewed and considered.    Pt appears well, no ischemia on ECG, just freq ectopy.  Many vague symptoms of fatigue, "not feeling well", nausea although pt reports eating about 4 different things for a late breakfast that day and began naming multiple different foods that she likes to eat without the appearence of any nausea whatsoever.  She reports 1 dark stool, will get hemoccult, check orthostatics.  Pt reported chest tightness in the setting of feeling SOB similar to prior COPD exacerbations.  Pt's chest tightness is essentially resolved.  Afebrile.       Jenna Marquez. [place], MD 08/11/13 1240

## 2013-08-10 NOTE — ED Notes (Signed)
Pt reports n/v x 1 yesterday. Nausea today. Has black BM this am. Chest palpitations around 130pm after using proventil. Chest tightness with SOB and diaphoretic x 1hour pta

## 2013-08-10 NOTE — Discharge Instructions (Signed)
 Chest Pain (Nonspecific) It is often hard to give a specific diagnosis for the cause of chest pain. There is always a chance that your pain could be related to something serious, such as a heart attack or a blood clot in the lungs. You need to follow up with your caregiver for further evaluation. CAUSES   Heartburn.  Pneumonia or bronchitis.  Anxiety or stress.  Inflammation around your heart (pericarditis) or lung (pleuritis or pleurisy).  A blood clot in the lung.  A collapsed lung (pneumothorax). It can develop suddenly on its own (spontaneous pneumothorax) or from injury (trauma) to the chest.  Shingles infection (herpes zoster virus). The chest wall is composed of bones, muscles, and cartilage. Any of these can be the source of the pain.  The bones can be bruised by injury.  The muscles or cartilage can be strained by coughing or overwork.  The cartilage can be affected by inflammation and become sore (costochondritis). DIAGNOSIS  Lab tests or other studies, such as X-rays, electrocardiography, stress testing, or cardiac imaging, may be needed to find the cause of your pain.  TREATMENT   Treatment depends on what may be causing your chest pain. Treatment may include:  Acid blockers for heartburn.  Anti-inflammatory medicine.  Pain medicine for inflammatory conditions.  Antibiotics if an infection is present.  You may be advised to change lifestyle habits. This includes stopping smoking and avoiding alcohol, caffeine, and chocolate.  You may be advised to keep your head raised (elevated) when sleeping. This reduces the chance of acid going backward from your stomach into your esophagus.  Most of the time, nonspecific chest pain will improve within 2 to 3 days with rest and mild pain medicine. HOME CARE INSTRUCTIONS   If antibiotics were prescribed, take your antibiotics as directed. Finish them even if you start to feel better.  For the next few days, avoid physical  activities that bring on chest pain. Continue physical activities as directed.  Do not smoke.  Avoid drinking alcohol.  Only take over-the-counter or prescription medicine for pain, discomfort, or fever as directed by your caregiver.  Follow your caregiver's suggestions for further testing if your chest pain does not go away.  Keep any follow-up appointments you made. If you do not go to an appointment, you could develop lasting (chronic) problems with pain. If there is any problem keeping an appointment, you must call to reschedule. SEEK MEDICAL CARE IF:   You think you are having problems from the medicine you are taking. Read your medicine instructions carefully.  Your chest pain does not go away, even after treatment.  You develop a rash with blisters on your chest. SEEK IMMEDIATE MEDICAL CARE IF:   You have increased chest pain or pain that spreads to your arm, neck, jaw, back, or abdomen.  You develop shortness of breath, an increasing cough, or you are coughing up blood.  You have severe back or abdominal pain, feel nauseous, or vomit.  You develop severe weakness, fainting, or chills.  You have a fever. THIS IS AN EMERGENCY. Do not wait to see if the pain will go away. Get medical help at once. Call your local emergency services (911 in [place] Do not drive yourself to the hospital. MAKE SURE YOU:   Understand these instructions.  Will watch your condition.  Will get help right away if you are not doing well or get worse. Document Released: 05/11/2005 Document Revised: 10/24/2011 Document Reviewed: 03/06/2008 [facility] Patient Information 2014 [place].    Nausea, Adult Nausea is the feeling that you have an upset stomach or have to vomit. Nausea by itself is not likely a serious concern, but it may be an early sign of more serious medical problems. As nausea gets worse, it can lead to vomiting. If vomiting develops, there is the risk of dehydration.  CAUSES     Viral infections.  Food poisoning.  Medicines.  Pregnancy.  Motion sickness.  Migraine headaches.  Emotional distress.  Severe pain from any source.  Alcohol intoxication. HOME CARE INSTRUCTIONS  Get plenty of rest.  Ask your caregiver about specific rehydration instructions.  Eat small amounts of food and sip liquids more often.  Take all medicines as told by your caregiver. SEEK MEDICAL CARE IF:  You have not improved after 2 days, or you get worse.  You have a headache. SEEK IMMEDIATE MEDICAL CARE IF:   You have a fever.  You faint.  You keep vomiting or have blood in your vomit.  You are extremely weak or dehydrated.  You have dark or bloody stools.  You have severe chest or abdominal pain. MAKE SURE YOU:  Understand these instructions.  Will watch your condition.  Will get help right away if you are not doing well or get worse. Document Released: 09/08/2004 Document Revised: 04/25/2012 Document Reviewed: 04/13/2011 [facility] Patient Information 2014 [place], [place].

## 2013-08-11 ENCOUNTER — Encounter (HOSPITAL_BASED_OUTPATIENT_CLINIC_OR_DEPARTMENT_OTHER): Payer: Self-pay | Admitting: Emergency Medicine

## 2013-08-16 ENCOUNTER — Ambulatory Visit (INDEPENDENT_AMBULATORY_CARE_PROVIDER_SITE_OTHER): Payer: Medicare HMO | Admitting: Family Medicine

## 2013-08-16 ENCOUNTER — Encounter: Payer: Self-pay | Admitting: Family Medicine

## 2013-08-16 VITALS — BP 130/89 | HR 100 | Temp 97.5°F | Resp 16 | Wt 127.5 lb

## 2013-08-16 DIAGNOSIS — J441 Chronic obstructive pulmonary disease with (acute) exacerbation: Secondary | ICD-10-CM | POA: Insufficient documentation

## 2013-08-16 DIAGNOSIS — F411 Generalized anxiety disorder: Secondary | ICD-10-CM

## 2013-08-16 DIAGNOSIS — R002 Palpitations: Secondary | ICD-10-CM

## 2013-08-16 DIAGNOSIS — R079 Chest pain, unspecified: Secondary | ICD-10-CM

## 2013-08-16 HISTORY — DX: Chronic obstructive pulmonary disease with (acute) exacerbation: J44.1

## 2013-08-16 HISTORY — DX: Palpitations: R00.2

## 2013-08-16 MED ORDER — AZITHROMYCIN 250 MG PO TABS: ORAL_TABLET | ORAL | Status: DC

## 2013-08-16 MED ORDER — AMOXICILLIN 500 MG PO TABS: 500.0000 mg | ORAL_TABLET | Freq: Two times a day (BID) | ORAL | Status: DC

## 2013-08-16 MED ORDER — PROMETHAZINE-DM 6.25-15 MG/5ML PO SYRP
5.0000 mL | ORAL_SOLUTION | Freq: Four times a day (QID) | ORAL | Status: DC | PRN
Start: 2013-08-16 — End: 2014-07-14

## 2013-08-16 MED ORDER — GUAIFENESIN-CODEINE 100-10 MG/5ML PO SYRP: 10.0000 mL | ORAL_SOLUTION | Freq: Three times a day (TID) | ORAL | Status: DC | PRN

## 2013-08-16 NOTE — Patient Instructions (Addendum)
Follow up as needed Start the Amoxicillin for a COPD exacerbation The albuterol is causing your palpitations- in combo w/ your anxiety Take the Xanax as needed Use the cough syrup as needed- will cause drowsiness Mucinex to thin your congestion Drink plenty of fluids REST! Call with any questions or concerns Hang in there! Happy New Year!

## 2013-08-16 NOTE — Assessment & Plan Note (Signed)
Deteriorated.  Initially pt thought her anxiety was due to her husband's death.  It has not improved and in fact has worsened since last visit.  Pt is not taking the xanax she has available to her.  Encouraged her to do so.  Discussed daily controller med- pt is not interested at this time.  Will follow.

## 2013-08-16 NOTE — Assessment & Plan Note (Signed)
New to provider- recurrent for pt.  Has had normal cardiology w/u, had normal EKG and labs in ER on 12/27.  Suspect some of her palpitations are due to albuterol use.  Limit albuterol to only when needed.  Reviewed supportive care and red flags that should prompt return.  Pt expressed understanding and is in agreement w/ plan.

## 2013-08-16 NOTE — Progress Notes (Signed)
   Subjective:    Patient ID: Jenna Marquez, female    DOB: 1945-09-25, 68 y.o.   MRN: [id number]  [facility] f/u- pt was seen on 12/27 for chest tightness/palpitations.  Had normal EKG, CXR, labs.  Process was felt to be viral.  Pt reports she continues to have SOB in setting of COPD.  On Symbicort twice daily.  Took 2 puffs of albuterol just prior to arrival- very shaky.  Having nasal congestion, productive cough.  No appetite, no energy.  Had 1 dark tarry stool prior to arrival in ER- heme negative.  No additional dark stools.  Pt's thought process is very scattered- talking about pink lip stick, goes right into chills/SOB, burglar alarms, etc   Review of Systems For ROS see HPI     Objective:   Physical Exam  Vitals reviewed. Constitutional: She appears well-developed and well-nourished. No distress.  HENT:  Head: Normocephalic and atraumatic.  TMs normal bilaterally Mild nasal congestion Throat w/out erythema, edema, or exudate  Eyes: Conjunctivae and EOM are normal. Pupils are equal, round, and reactive to light.  Neck: Normal range of motion. Neck supple.  Cardiovascular: Normal rate, regular rhythm, normal heart sounds and intact distal pulses.   No murmur heard. Pulmonary/Chest: Effort normal and breath sounds normal. No respiratory distress. She has no wheezes.  + hacking cough  Lymphadenopathy:    She has no cervical adenopathy.          Assessment & Plan:

## 2013-08-16 NOTE — Addendum Note (Signed)
Addended by: Midge Minium on: 08/16/2013 01:08 PM   Modules accepted: Orders

## 2013-08-16 NOTE — Assessment & Plan Note (Signed)
New.  Start abx- pt reports Zpack gives her diarrhea, but willing to take Amoxicillin.  Encouraged regular use of Symbicort.  Limit albuterol use only to as needed due to palpitations and shakiness after dosing.  Pt has pulm- encouraged her to f/u there if sxs persist.  Pt expressed understanding and is in agreement w/ plan.

## 2013-08-16 NOTE — Progress Notes (Signed)
Pre visit review using our clinic review tool, if applicable. No additional management support is needed unless otherwise documented below in the visit note. 

## 2013-08-16 NOTE — Assessment & Plan Note (Signed)
Resolved since ER visit 5 days ago.  Currently asymptomatic w/ exception of respiratory sxs.  Will follow.

## 2013-09-29 ENCOUNTER — Other Ambulatory Visit: Payer: Self-pay | Admitting: Family Medicine

## 2013-10-02 NOTE — Telephone Encounter (Signed)
Med filled.  

## 2013-10-28 ENCOUNTER — Other Ambulatory Visit: Payer: Self-pay | Admitting: Family Medicine

## 2013-10-29 NOTE — Telephone Encounter (Signed)
Med filled.  

## 2013-10-31 ENCOUNTER — Telehealth: Payer: Self-pay | Admitting: Family Medicine

## 2013-10-31 MED ORDER — ALPRAZOLAM 0.5 MG PO TABS: ORAL_TABLET | ORAL | Status: DC

## 2013-10-31 NOTE — Telephone Encounter (Signed)
[facility] pharmacy tech called and stated they still have not received patient's ALPRAZolam (XANAX) 0.5 MG tablet. Also he stated that patient called last night and stated that she really needed that filled. Please advise.

## 2013-10-31 NOTE — Telephone Encounter (Signed)
Med filled.  

## 2014-01-09 ENCOUNTER — Telehealth: Payer: Self-pay

## 2014-01-09 ENCOUNTER — Emergency Department (HOSPITAL_BASED_OUTPATIENT_CLINIC_OR_DEPARTMENT_OTHER): Payer: Medicare HMO

## 2014-01-09 ENCOUNTER — Emergency Department (HOSPITAL_BASED_OUTPATIENT_CLINIC_OR_DEPARTMENT_OTHER)
Admission: EM | Admit: 2014-01-09 | Discharge: 2014-01-09 | Disposition: A | Discharge status: Home / Self Care | Payer: Medicare HMO | Attending: Emergency Medicine | Admitting: Emergency Medicine

## 2014-01-09 ENCOUNTER — Encounter (HOSPITAL_BASED_OUTPATIENT_CLINIC_OR_DEPARTMENT_OTHER): Payer: Self-pay | Admitting: Emergency Medicine

## 2014-01-09 DIAGNOSIS — Z87891 Personal history of nicotine dependence: Secondary | ICD-10-CM | POA: Insufficient documentation

## 2014-01-09 DIAGNOSIS — Z792 Long term (current) use of antibiotics: Secondary | ICD-10-CM | POA: Insufficient documentation

## 2014-01-09 DIAGNOSIS — J45901 Unspecified asthma with (acute) exacerbation: Principal | ICD-10-CM

## 2014-01-09 DIAGNOSIS — J441 Chronic obstructive pulmonary disease with (acute) exacerbation: Secondary | ICD-10-CM

## 2014-01-09 DIAGNOSIS — Z79899 Other long term (current) drug therapy: Secondary | ICD-10-CM | POA: Insufficient documentation

## 2014-01-09 DIAGNOSIS — F411 Generalized anxiety disorder: Secondary | ICD-10-CM | POA: Insufficient documentation

## 2014-01-09 HISTORY — DX: Anxiety disorder, unspecified: F41.9

## 2014-01-09 LAB — CBC WITH DIFFERENTIAL/PLATELET
Basophils Absolute: 0.1 10*3/uL (ref 0.0–0.1)
Basophils Relative: 1 % (ref 0–1)
EOS ABS: 0 10*3/uL (ref 0.0–0.7)
Eosinophils Relative: 1 % (ref 0–5)
HCT: 37.2 % (ref 36.0–46.0)
HEMOGLOBIN: 12.9 g/dL (ref 12.0–15.0)
Lymphocytes Relative: 29 % (ref 12–46)
Lymphs Abs: 1.6 10*3/uL (ref 0.7–4.0)
MCH: 36.5 pg — ABNORMAL HIGH (ref 26.0–34.0)
MCHC: 34.7 g/dL (ref 30.0–36.0)
MCV: 105.4 fL — ABNORMAL HIGH (ref 78.0–100.0)
MONOS PCT: 14 % — AB (ref 3–12)
Monocytes Absolute: 0.7 10*3/uL (ref 0.1–1.0)
NEUTROS ABS: 3 10*3/uL (ref 1.7–7.7)
NEUTROS PCT: 55 % (ref 43–77)
PLATELETS: 271 10*3/uL (ref 150–400)
RBC: 3.53 MIL/uL — ABNORMAL LOW (ref 3.87–5.11)
RDW: 12 % (ref 11.5–15.5)
WBC: 5.4 10*3/uL (ref 4.0–10.5)

## 2014-01-09 LAB — COMPREHENSIVE METABOLIC PANEL
ALBUMIN: 4.3 g/dL (ref 3.5–5.2)
ALK PHOS: 91 U/L (ref 39–117)
ALT: 25 U/L (ref 0–35)
AST: 74 U/L — ABNORMAL HIGH (ref 0–37)
BILIRUBIN TOTAL: 0.9 mg/dL (ref 0.3–1.2)
BUN: 16 mg/dL (ref 6–23)
CHLORIDE: 98 meq/L (ref 96–112)
CO2: 26 mEq/L (ref 19–32)
Calcium: 10.2 mg/dL (ref 8.4–10.5)
Creatinine, Ser: 1 mg/dL (ref 0.50–1.10)
GFR calc Af Amer: 65 mL/min — ABNORMAL LOW (ref >90)
GFR calc non Af Amer: 56 mL/min — ABNORMAL LOW (ref >90)
Glucose, Bld: 107 mg/dL — ABNORMAL HIGH (ref 70–99)
POTASSIUM: 4.8 meq/L (ref 3.7–5.3)
SODIUM: 141 meq/L (ref 137–147)
TOTAL PROTEIN: 7.6 g/dL (ref 6.0–8.3)

## 2014-01-09 LAB — TROPONIN I

## 2014-01-09 MED ORDER — PREDNISONE 50 MG PO TABS
60.0000 mg | ORAL_TABLET | Freq: Once | ORAL | Status: AC
  Administered 2014-01-09: 18:00:00 60 mg via ORAL
  Filled 2014-01-09 (×2): qty 1

## 2014-01-09 MED ORDER — ALBUTEROL SULFATE (2.5 MG/3ML) 0.083% IN NEBU: 2.5000 mg | INHALATION_SOLUTION | Freq: Four times a day (QID) | RESPIRATORY_TRACT | Status: DC | PRN

## 2014-01-09 MED ORDER — ALBUTEROL SULFATE (2.5 MG/3ML) 0.083% IN NEBU
2.5000 mg | INHALATION_SOLUTION | Freq: Once | RESPIRATORY_TRACT | Status: AC
  Administered 2014-01-09: 2.5 mg via RESPIRATORY_TRACT
  Filled 2014-01-09: qty 3

## 2014-01-09 MED ORDER — IPRATROPIUM BROMIDE 0.02 % IN SOLN
0.5000 mg | Freq: Once | RESPIRATORY_TRACT | Status: AC
  Administered 2014-01-09: 0.5 mg via RESPIRATORY_TRACT
  Filled 2014-01-09: qty 2.5

## 2014-01-09 MED ORDER — IPRATROPIUM-ALBUTEROL 0.5-2.5 (3) MG/3ML IN SOLN
3.0000 mL | Freq: Once | RESPIRATORY_TRACT | Status: AC
  Administered 2014-01-09: 3 mL via RESPIRATORY_TRACT
  Filled 2014-01-09: qty 3

## 2014-01-09 MED ORDER — ALBUTEROL SULFATE (2.5 MG/3ML) 0.083% IN NEBU
5.0000 mg | INHALATION_SOLUTION | Freq: Once | RESPIRATORY_TRACT | Status: AC
  Administered 2014-01-09: 5 mg via RESPIRATORY_TRACT
  Filled 2014-01-09: qty 6

## 2014-01-09 MED ORDER — PREDNISONE 10 MG PO TABS: 20.0000 mg | ORAL_TABLET | Freq: Every day | ORAL | Status: DC

## 2014-01-09 NOTE — Telephone Encounter (Signed)
I tried calling the patient back to check her status and she was on the line with Caryl Pina.      KP

## 2014-01-09 NOTE — ED Notes (Signed)
Wheezing, SOB with exertion and cough x3 days.  Called pmd and they sent her here.

## 2014-01-09 NOTE — ED Notes (Addendum)
Pt refused EKG.  Vonita Moss, RN notified as well as V. Alvino Chapel, NP.

## 2014-01-09 NOTE — Discharge Instructions (Signed)
Chronic Obstructive Pulmonary Disease  Chronic obstructive pulmonary disease (COPD) is a common lung condition in which airflow from the lungs is limited. COPD is a general term that can be used to describe many different lung problems that limit airflow, including both chronic bronchitis and emphysema.  If you have COPD, your lung function will probably never return to normal, but there are measures you can take to improve lung function and make yourself feel better.   CAUSES   · Smoking (common).    · Exposure to secondhand smoke.    · Genetic problems.  · Chronic inflammatory lung diseases or recurrent infections.  SYMPTOMS   · Shortness of breath, especially with physical activity.    · Deep, persistent (chronic) cough with a large amount of thick mucus.    · Wheezing.    · Rapid breaths (tachypnea).    · Gray or bluish discoloration (cyanosis) of the skin, especially in fingers, toes, or lips.    · Fatigue.    · Weight loss.    · Frequent infections or episodes when breathing symptoms become much worse (exacerbations).    · Chest tightness.  DIAGNOSIS   Your healthcare provider will take a medical history and perform a physical examination to make the initial diagnosis.  Additional tests for COPD may include:   · Lung (pulmonary) function tests.  · Chest X-ray.  · CT scan.  · Blood tests.  TREATMENT   Treatment available to help you feel better when you have COPD include:   · Inhaler and nebulizer medicines. These help manage the symptoms of COPD and make your breathing more comfortable  · Supplemental oxygen. Supplemental oxygen is only helpful if you have a low oxygen level in your blood.    · Exercise and physical activity. These are beneficial for nearly all people with COPD. Some people may also benefit from a pulmonary rehabilitation program.  HOME CARE INSTRUCTIONS   · Take all medicines (inhaled or pills) as directed by your health care provider.  · Only take over-the-counter or prescription medicines  for pain, fever, or discomfort as directed by your health care provider.    · Avoid over-the-counter medicines or cough syrups that dry up your airway (such as antihistamines) and slow down the elimination of secretions unless instructed otherwise by your healthcare provider.    · If you are a smoker, the most important thing that you can do is stop smoking. Continuing to smoke will cause further lung damage and breathing trouble. Ask your health care provider for help with quitting smoking. He or she can direct you to community resources or hospitals that provide support.  · Avoid exposure to irritants such as smoke, chemicals, and fumes that aggravate your breathing.  · Use oxygen therapy and pulmonary rehabilitation if directed by your health care provider. If you require home oxygen therapy, ask your healthcare provider whether you should purchase a pulse oximeter to measure your oxygen level at home.    · Avoid contact with individuals who have a contagious illness.  · Avoid extreme temperature and humidity changes.  · Eat healthy foods. Eating smaller, more frequent meals and resting before meals may help you maintain your strength.  · Stay active, but balance activity with periods of rest. Exercise and physical activity will help you maintain your ability to do things you want to do.  · Preventing infection and hospitalization is very important when you have COPD. Make sure to receive all the vaccines your health care provider recommends, especially the pneumococcal and influenza vaccines. Ask your healthcare provider whether you   need a pneumonia vaccine.  · Learn and use relaxation techniques to manage stress.  · Learn and use controlled breathing techniques as directed by your health care provider. Controlled breathing techniques include:    · Pursed lip breathing. Start by breathing in (inhaling) through your nose for 1 second. Then, purse your lips as if you were going to whistle and breathe out (exhale)  through the pursed lips for 2 seconds.    · Diaphragmatic breathing. Start by putting one hand on your abdomen just above your waist. Inhale slowly through your nose. The hand on your abdomen should move out. Then purse your lips and exhale slowly. You should be able to feel the hand on your abdomen moving in as you exhale.    · Learn and use controlled coughing to clear mucus from your lungs. Controlled coughing is a series of short, progressive coughs. The steps of controlled coughing are:    1. Lean your head slightly forward.    2. Breathe in deeply using diaphragmatic breathing.    3. Try to hold your breath for 3 seconds.    4. Keep your mouth slightly open while coughing twice.    5. Spit any mucus out into a tissue.    6. Rest and repeat the steps once or twice as needed.  SEEK MEDICAL CARE IF:   · You are coughing up more mucus than usual.    · There is a change in the color or thickness of your mucus.    · Your breathing is more labored than usual.    · Your breathing is faster than usual.    SEEK IMMEDIATE MEDICAL CARE IF:   · You have shortness of breath while you are resting.    · You have shortness of breath that prevents you from:  · Being able to talk.    · Performing your usual physical activities.    · You have chest pain lasting longer than 5 minutes.    · Your skin color is more cyanotic than usual.  · You measure low oxygen saturations for longer than 5 minutes with a pulse oximeter.  MAKE SURE YOU:   · Understand these instructions.  · Will watch your condition.  · Will get help right away if you are not doing well or get worse.  Document Released: 05/11/2005 Document Revised: 05/22/2013 Document Reviewed: 03/28/2013  [employer]® Patient Information ©2014 [employer].

## 2014-01-09 NOTE — ED Provider Notes (Signed)
CSN: [id number]     Arrival date & time 01/09/14  1734 History   First MD Initiated Contact with Patient 01/09/14 1812     Chief Complaint  Patient presents with  . Shortness of Breath     (Consider location/radiation/quality/duration/timing/severity/associated sxs/prior Treatment) HPI Comments: Pt c/o wheezing, sob and cough for the last 3 days. Pain under her right breast. Hasn't used inhaler at home as she was told by her pcp to just come in. Pt states that she started using a vapor cigarette and that is when the symptoms started. Denies fever. Unsure of the last time that she had prednisone  The history is provided by the patient. No language interpreter was used.    Past Medical History  Diagnosis Date  . COPD (chronic obstructive pulmonary disease)   . Asthma   . Allergic rhinitis   . Anxiety    Past Surgical History  Procedure Laterality Date  . Ectopic pregnancy surgery  1974  . Tonsillectomy and adenoidectomy  1954   Family History  Problem Relation Age of Onset  . Coronary artery disease Mother   . Heart disease Mother     CHF  . Diabetes Neg Hx   . Cancer Neg Hx   . Stroke Neg Hx   . Colon cancer Neg Hx    History  Substance Use Topics  . Smoking status: Former Smoker -- 1.00 packs/day for 35 years    Types: Cigarettes    Quit date: 08/10/2012  . Smokeless tobacco: Never Used     Comment: Started at age 40  . Alcohol Use: Yes     Comment: socially   OB History   Grav Para Term Preterm Abortions TAB SAB Ect Mult Living                 Review of Systems  Constitutional: Negative for fever.  Respiratory: Positive for cough and shortness of breath.   Cardiovascular: Positive for chest pain.      Allergies  Avelox  Home Medications   Prior to Admission medications   Medication Sig Start Date End Date Taking? Authorizing Provider  albuterol (PROVENTIL HFA) 108 (90 BASE) MCG/ACT inhaler Inhale 2 puffs into the lungs every 4 (four) hours as needed  for wheezing or shortness of breath.  07/21/12   Elsie Stain, MD  albuterol (PROVENTIL) (2.5 MG/3ML) 0.083% nebulizer solution Take 3 mLs (2.5 mg total) by nebulization every 4 (four) hours as needed for wheezing or shortness of breath. DX 496 06/07/13   Tanda Rockers, MD  ALPRAZolam Duanne Moron) 0.5 MG tablet TAKE 1 TABLET BY MOUTH AT BEDTIME AS NEEDED FOR SLEEP 10/31/13   Midge Minium, MD  amoxicillin (AMOXIL) 500 MG tablet Take 1 tablet (500 mg total) by mouth 2 (two) times daily. 08/16/13   Midge Minium, MD  budesonide-formoterol [facility]) 160-4.5 MCG/ACT inhaler Take 2 puffs first thing in am and then another 2 puffs about 12 hours later. 06/07/13   Tanda Rockers, MD  Calcium Carb-Cholecalciferol (CALTRATE 600+D SOFT) 600-800 MG-UNIT CHEW Chew 1 tablet by mouth 2 (two) times daily.    Historical Provider, MD  guaiFENesin (MUCINEX) 600 MG 12 hr tablet 1-2 twice daily as needed    Historical Provider, MD  guaiFENesin-codeine (ROBITUSSIN AC) 100-10 MG/5ML syrup Take 10 mLs by mouth 3 (three) times daily as needed for cough. 08/16/13   Midge Minium, MD  ibuprofen (ADVIL,MOTRIN) 800 MG tablet TAKE 1 TABLET BY MOUTH EVERY 8  HOURS AS NEEDED    Midge Minium, MD  mometasone (NASONEX) 50 MCG/ACT nasal spray Place 2 sprays into the nose at bedtime.     Historical Provider, MD  Multiple Vitamin (MULTIVITAMIN) tablet Take 1 tablet by mouth every morning.    Historical Provider, MD  ondansetron (ZOFRAN) 4 MG tablet Take 1 tablet (4 mg total) by mouth every 6 (six) hours. 08/10/13   Saddie Benders. Ghim, MD  predniSONE (STERAPRED UNI-PAK) 10 MG tablet Prednisone 10 mg take  4 each am x 2 days,   2 each am x 2 days,  1 each am x2days and stop 06/07/13   Tanda Rockers, MD  promethazine-dextromethorphan (PROMETHAZINE-DM) 6.25-15 MG/5ML syrup Take 5 mLs by mouth 4 (four) times daily as needed. 08/16/13   Midge Minium, MD  Sodium Chloride-Sodium Bicarb (AYR SALINE NASAL NETI RINSE NA) Rinse as  needed for nasal congestion    Historical Provider, MD  vitamin C (ASCORBIC ACID) 500 MG tablet Take 500 mg by mouth every morning.    Historical Provider, MD   BP 173/92  Pulse 107  Temp(Src) 98.4 F (36.9 C) (Oral)  Resp 26  Ht 5' 6.5" (1.689 m)  Wt 128 lb (58.06 kg)  BMI 20.35 kg/m2  SpO2 93% Physical Exam  Nursing note and vitals reviewed. Constitutional: She is oriented to person, place, and time. She appears well-developed and well-nourished.  HENT:  Right Ear: External ear normal.  Left Ear: External ear normal.  Mouth/Throat: Oropharynx is clear and moist.  Cardiovascular: Normal rate and regular rhythm.   Pulmonary/Chest: No respiratory distress. She has wheezes.  Abdominal: Soft. Bowel sounds are normal. There is no tenderness.  Musculoskeletal: Normal range of motion.  Neurological: She is alert and oriented to person, place, and time.  Skin: Skin is warm and dry.  Psychiatric: She has a normal mood and affect.    ED Course  Procedures (including critical care time) Labs Review Labs Reviewed  CBC WITH DIFFERENTIAL - Abnormal; Notable for the following:    RBC 3.53 (*)    MCV 105.4 (*)    MCH 36.5 (*)    Monocytes Relative 14 (*)    All other components within normal limits  COMPREHENSIVE METABOLIC PANEL - Abnormal; Notable for the following:    Glucose, Bld 107 (*)    AST 74 (*)    GFR calc non Af Amer 56 (*)    GFR calc Af Amer 65 (*)    All other components within normal limits  TROPONIN I    Imaging Review Dg Chest 2 View  01/09/2014   CLINICAL DATA:  Cough.  EXAM: CHEST  2 VIEW  COMPARISON:  08/10/2013.  FINDINGS: Mediastinum and hilar structures normal. Lungs are clear. Heart size normal. No pleural effusion or pneumothorax. Degenerative changes thoracic spine.  IMPRESSION: No active cardiopulmonary disease.   Electronically Signed   By: Marcello Moores  Register   On: 01/09/2014 18:24     EKG Interpretation None      MDM   Final diagnoses:  COPD  exacerbation    Pt doing better after 2 treatments:no pneumonia noted. Will send home on steroids and refilled her albuterol nebs   Glendell Docker, NP 01/09/14 2037

## 2014-01-09 NOTE — Telephone Encounter (Signed)
Called patient to follow up. She was still at home waiting on her niece Jonelle Sidle.   Pt was clearly in respiratory distress---congested cough, c/o chest tightness and shortness of breath--but able to speak with minimal difficulty.  Jonelle Sidle was called, and stated that she was waiting on someone to come and sit with her son.  Jonelle Sidle was made aware that patient needed to go to the ER asap.  Jonelle Sidle thought that she was bringing patient to our office and did not realize the urgency of the situation.  Jonelle Sidle was informed that I was going to call EMS.  Jonelle Sidle stated to do so and that she was on her way.  Called patient back to make her aware that Jonelle Sidle was on her way and that I was going to call EMS.  Patient repeated several times not to call (she was concerned about the cost) and stated that if I called EMS, she was not going to go to the hospital.  She begged that I not call EMS, but instead to allow her to wait for her niece.  She repeatedly stated, "I'm not that bad off," and have felt worse before.  Niece called while we were on the phone and stated that she was indeed on her way.  Patient stated that she lives 5 minutes away.  I waited on the phone with patient until niece arrived.  While waiting for the niece to arrive, patient was talking and walking around in the house, even though she was encouraged to sit down, relax, and concentrate on her breathing.  Approximately 8 minutes later, niece arrived and stated that she was taking the patient to the ER.

## 2014-01-09 NOTE — ED Notes (Signed)
NP at bedside.

## 2014-01-09 NOTE — Telephone Encounter (Signed)
Agree w/ advice given.  Pt needs ER evaluation- would prefer she calls 911 but pt refused multiple times during course of her conversation w/ Ronaldo Miyamoto, CMA.

## 2014-01-09 NOTE — Telephone Encounter (Signed)
Spoke with patient and she stated for the past few days she has been hurting in her ribs, coughing, SOB which is worst when she is walking and now having palpitations, She said she has been using her rescue inhaler all day and it is not working. Pulmonary told her to stop smoking and she had stopped and she is now using vapors and she is getting worst. She has a Hx of COPD and Asthma and was told earlier today to callback at 4 pm for an apt tomorrow . I asked the patient was anyone with her and she said no, said she lost her husband 2 years ago, the patient sounded to be in respiratory distress and she is wheezing and sounds out of breath, she started crying and said she was concerned because she feels so bad and did not want to be an inconvenience to anyone, I advised the patient that I was worried and advise she would need to call 911 so she can be evaluated and given a breathing treatment and they can determine if she needs to go to the ED. The patient said she can drive I advised due to the way she sounded she really needed to call 911 instead of driving, she said she can find someone to try to take her but it will be another 20 min's before they get there. I discussed with Dr.Tabori who urges the patient to go to the ED ASAP. She said she will go to the Hospital but she would wait until her daughter in law got there, I made Dr.Tabori aware who advised that if the symptoms worsen before family member gets there she will need to call 911 and be transported to the hospital. The patient has agreed to do so.    KP

## 2014-01-10 NOTE — ED Provider Notes (Signed)
Medical screening examination/treatment/procedure(s) were performed by non-physician practitioner and as supervising physician I was immediately available for consultation/collaboration.   EKG Interpretation None        Neta Ehlers, MD 01/10/14 [phone]

## 2014-01-29 ENCOUNTER — Ambulatory Visit (INDEPENDENT_AMBULATORY_CARE_PROVIDER_SITE_OTHER): Payer: Medicare HMO | Admitting: Family Medicine

## 2014-01-29 ENCOUNTER — Encounter: Payer: Self-pay | Admitting: Family Medicine

## 2014-01-29 VITALS — BP 128/78 | HR 90 | Temp 97.9°F | Resp 16 | Wt 127.2 lb

## 2014-01-29 DIAGNOSIS — J309 Allergic rhinitis, unspecified: Secondary | ICD-10-CM

## 2014-01-29 DIAGNOSIS — J449 Chronic obstructive pulmonary disease, unspecified: Secondary | ICD-10-CM

## 2014-01-29 MED ORDER — LEVALBUTEROL TARTRATE 45 MCG/ACT IN AERO
2.0000 | INHALATION_SPRAY | Freq: Four times a day (QID) | RESPIRATORY_TRACT | Status: DC | PRN
Start: 2014-01-29 — End: 2014-12-24

## 2014-01-29 MED ORDER — LEVALBUTEROL HCL 1.25 MG/3ML IN NEBU: 1.2500 mg | INHALATION_SOLUTION | Freq: Three times a day (TID) | RESPIRATORY_TRACT | Status: DC | PRN

## 2014-01-29 NOTE — Progress Notes (Signed)
   Subjective:    Patient ID: Jenna Marquez, female    DOB: [birth year], 68 y.o.   MRN: [id number]  HPI COPD exacerbation- pt was seen in ER on 5/28 for COPD.  Treated w/ prednisone.  On Symbicort twice daily, albuterol as needed.  Has seen Dr Joya Gaskins previously.  Using Albuterol at least daily.  Pulmonary stopped Spiriva and Advair.  Pt reports albuterol is causing palpitations and jittery feeling.  + nasal congestion, PND.   Review of Systems For ROS see HPI     Objective:   Physical Exam  Constitutional: She appears well-developed and well-nourished. No distress.  HENT:  Head: Normocephalic and atraumatic.  TMs normal bilaterally Mild nasal congestion Throat w/out erythema, edema, + PND  Eyes: Conjunctivae and EOM are normal. Pupils are equal, round, and reactive to light.  Neck: Normal range of motion. Neck supple.  Cardiovascular: Normal rate, regular rhythm, normal heart sounds and intact distal pulses.   No murmur heard. Pulmonary/Chest: Effort normal and breath sounds normal. No respiratory distress. She has no wheezes. She has no rales.  Lymphadenopathy:    She has no cervical adenopathy.          Assessment & Plan:

## 2014-01-29 NOTE — Assessment & Plan Note (Signed)
Chronic problem, deteriorated.  Recommended daily use of OTC antihistamine to prevent PND and cough.  Reviewed supportive care and red flags that should prompt return.

## 2014-01-29 NOTE — Progress Notes (Signed)
Pre visit review using our clinic review tool, if applicable. No additional management support is needed unless otherwise documented below in the visit note. 

## 2014-01-29 NOTE — Patient Instructions (Signed)
Follow up as scheduled STOP the albuterol nebulizer and Proventil inhaler- this is what's making you jittery START the Xopenex inhaler and nebulizer in place of the albuterol Start Claritin or Zyrtec daily for the allergies Mucinex as needed to thin your congestion Call with any questions or concerns Hang in there!  You look great!!

## 2014-01-29 NOTE — Assessment & Plan Note (Signed)
Chronic problem.  Asymptomatic today after recent exacerbation.  Since pt is having palpitations and jittery feelings, will switch from albuterol to Xopenex and monitor for improvement.

## 2014-01-31 ENCOUNTER — Telehealth: Payer: Self-pay | Admitting: Family Medicine

## 2014-01-31 NOTE — Telephone Encounter (Signed)
Called pharmacy and requested that prior auth forms be faxed to [place]. JG//CMA

## 2014-01-31 NOTE — Telephone Encounter (Signed)
Sam from [facility] pharmacy called to request prior authorization for levalbuterol [facility] HFA) 45 MCG/ACT inhaler and levalbuterol (XOPENEX) 1.25 MG/3ML nebulizer solution

## 2014-02-10 ENCOUNTER — Encounter (HOSPITAL_BASED_OUTPATIENT_CLINIC_OR_DEPARTMENT_OTHER): Payer: Self-pay | Admitting: Emergency Medicine

## 2014-02-10 ENCOUNTER — Emergency Department (HOSPITAL_BASED_OUTPATIENT_CLINIC_OR_DEPARTMENT_OTHER): Payer: Medicare HMO

## 2014-02-10 ENCOUNTER — Telehealth: Payer: Self-pay

## 2014-02-10 ENCOUNTER — Emergency Department (HOSPITAL_BASED_OUTPATIENT_CLINIC_OR_DEPARTMENT_OTHER)
Admission: EM | Admit: 2014-02-10 | Discharge: 2014-02-10 | Disposition: A | Discharge status: Home / Self Care | Payer: Medicare HMO | Attending: Emergency Medicine | Admitting: Emergency Medicine

## 2014-02-10 DIAGNOSIS — Z791 Long term (current) use of non-steroidal anti-inflammatories (NSAID): Secondary | ICD-10-CM | POA: Insufficient documentation

## 2014-02-10 DIAGNOSIS — Z87891 Personal history of nicotine dependence: Secondary | ICD-10-CM | POA: Insufficient documentation

## 2014-02-10 DIAGNOSIS — J441 Chronic obstructive pulmonary disease with (acute) exacerbation: Secondary | ICD-10-CM | POA: Insufficient documentation

## 2014-02-10 DIAGNOSIS — F411 Generalized anxiety disorder: Secondary | ICD-10-CM | POA: Insufficient documentation

## 2014-02-10 DIAGNOSIS — IMO0002 Reserved for concepts with insufficient information to code with codable children: Secondary | ICD-10-CM | POA: Insufficient documentation

## 2014-02-10 DIAGNOSIS — Z79899 Other long term (current) drug therapy: Secondary | ICD-10-CM | POA: Insufficient documentation

## 2014-02-10 DIAGNOSIS — J45901 Unspecified asthma with (acute) exacerbation: Principal | ICD-10-CM

## 2014-02-10 LAB — D-DIMER, QUANTITATIVE (NOT AT ARMC): D DIMER QUANT: 0.33 ug{FEU}/mL (ref 0.00–0.48)

## 2014-02-10 LAB — CBC
HEMATOCRIT: 37.9 % (ref 36.0–46.0)
Hemoglobin: 13.3 g/dL (ref 12.0–15.0)
MCH: 35.8 pg — ABNORMAL HIGH (ref 26.0–34.0)
MCHC: 35.1 g/dL (ref 30.0–36.0)
MCV: 101.9 fL — ABNORMAL HIGH (ref 78.0–100.0)
Platelets: 277 10*3/uL (ref 150–400)
RBC: 3.72 MIL/uL — ABNORMAL LOW (ref 3.87–5.11)
RDW: 12 % (ref 11.5–15.5)
WBC: 4.3 10*3/uL (ref 4.0–10.5)

## 2014-02-10 LAB — TROPONIN I: Troponin I: <0.3 ng/mL (ref <0.30)

## 2014-02-10 LAB — BASIC METABOLIC PANEL
BUN: 17 mg/dL (ref 6–23)
CHLORIDE: 97 meq/L (ref 96–112)
CO2: 25 meq/L (ref 19–32)
Calcium: 9.9 mg/dL (ref 8.4–10.5)
Creatinine, Ser: 1 mg/dL (ref 0.50–1.10)
GFR calc non Af Amer: 56 mL/min — ABNORMAL LOW (ref >90)
GFR, EST AFRICAN AMERICAN: 65 mL/min — AB (ref >90)
GLUCOSE: 123 mg/dL — AB (ref 70–99)
Potassium: 4.1 mEq/L (ref 3.7–5.3)
SODIUM: 139 meq/L (ref 137–147)

## 2014-02-10 LAB — PRO B NATRIURETIC PEPTIDE: PRO B NATRI PEPTIDE: 238.8 pg/mL — AB (ref 0–125)

## 2014-02-10 MED ORDER — ALBUTEROL SULFATE (2.5 MG/3ML) 0.083% IN NEBU
5.0000 mg | INHALATION_SOLUTION | Freq: Once | RESPIRATORY_TRACT | Status: AC
  Administered 2014-02-10: 5 mg via RESPIRATORY_TRACT
  Filled 2014-02-10: qty 6

## 2014-02-10 MED ORDER — IPRATROPIUM BROMIDE 0.02 % IN SOLN
0.5000 mg | Freq: Once | RESPIRATORY_TRACT | Status: AC
  Administered 2014-02-10: 0.5 mg via RESPIRATORY_TRACT
  Filled 2014-02-10: qty 2.5

## 2014-02-10 MED ORDER — METHYLPREDNISOLONE SODIUM SUCC 125 MG IJ SOLR
125.0000 mg | Freq: Once | INTRAMUSCULAR | Status: AC
  Administered 2014-02-10: 125 mg via INTRAVENOUS
  Filled 2014-02-10: qty 2

## 2014-02-10 MED ORDER — PREDNISONE (PAK) 10 MG PO TABS: ORAL_TABLET | ORAL | Status: DC

## 2014-02-10 NOTE — Telephone Encounter (Signed)
Received prior authorization request from [place].  Called ([phone]) to initiate prior authorization.   Waiting letter for approval from the clinical pharmacist.//AB/CMA

## 2014-02-10 NOTE — ED Provider Notes (Signed)
CSN: [id number]     Arrival date & time 02/10/14  1358 History   First MD Initiated Contact with Patient 02/10/14 1503     Chief Complaint  Patient presents with  . Shortness of Breath    HPI Patient presents emergency room with complaints of shortness of breath. She does have a history of COPD. Symptoms started today. She tried using her inhaler this morning without relief. She felt  mucous congestion in her chest and took a Mucinex this morning. She has had Posttussive emesis. She has not had trouble with chest pain. No fevers. No leg swelling.  Walking around increases her symptoms and resting improves her symptoms.    Past Medical History  Diagnosis Date  . COPD (chronic obstructive pulmonary disease)   . Asthma   . Allergic rhinitis   . Anxiety    Past Surgical History  Procedure Laterality Date  . Ectopic pregnancy surgery  1974  . Tonsillectomy and adenoidectomy  1954   Family History  Problem Relation Age of Onset  . Coronary artery disease Mother   . Heart disease Mother     CHF  . Diabetes Neg Hx   . Cancer Neg Hx   . Stroke Neg Hx   . Colon cancer Neg Hx    History  Substance Use Topics  . Smoking status: Former Smoker -- 1.00 packs/day for 35 years    Types: Cigarettes    Quit date: 08/10/2012  . Smokeless tobacco: Never Used     Comment: Started at age 16  . Alcohol Use: Yes     Comment: socially   OB History   Grav Para Term Preterm Abortions TAB SAB Ect Mult Living                 Review of Systems  All other systems reviewed and are negative.     Allergies  Avelox  Home Medications   Prior to Admission medications   Medication Sig Start Date End Date Taking? Authorizing Provider  albuterol (PROVENTIL HFA) 108 (90 BASE) MCG/ACT inhaler Inhale 2 puffs into the lungs every 4 (four) hours as needed for wheezing or shortness of breath.  07/21/12   Elsie Stain, MD  albuterol (PROVENTIL) (2.5 MG/3ML) 0.083% nebulizer solution Take 3 mLs (2.5  mg total) by nebulization every 4 (four) hours as needed for wheezing or shortness of breath. DX 496 06/07/13   Tanda Rockers, MD  albuterol (PROVENTIL) (2.5 MG/3ML) 0.083% nebulizer solution Take 3 mLs (2.5 mg total) by nebulization every 6 (six) hours as needed for wheezing or shortness of breath. 01/09/14   Glendell Docker, NP  ALPRAZolam Duanne Moron) 0.5 MG tablet TAKE 1 TABLET BY MOUTH AT BEDTIME AS NEEDED FOR SLEEP 10/31/13   Midge Minium, MD  budesonide-formoterol [facility]) 160-4.5 MCG/ACT inhaler Take 2 puffs first thing in am and then another 2 puffs about 12 hours later. 06/07/13   Tanda Rockers, MD  Calcium Carb-Cholecalciferol (CALTRATE 600+D SOFT) 600-800 MG-UNIT CHEW Chew 1 tablet by mouth 2 (two) times daily.    Historical Provider, MD  guaiFENesin-codeine (ROBITUSSIN AC) 100-10 MG/5ML syrup Take 10 mLs by mouth 3 (three) times daily as needed for cough. 08/16/13   Midge Minium, MD  ibuprofen (ADVIL,MOTRIN) 800 MG tablet TAKE 1 TABLET BY MOUTH EVERY 8 HOURS AS NEEDED    Midge Minium, MD  levalbuterol [facility] HFA) 45 MCG/ACT inhaler Inhale 2 puffs into the lungs every 6 (six) hours as needed for  wheezing. 01/29/14   Midge Minium, MD  levalbuterol Penne Lash) 1.25 MG/3ML nebulizer solution Take 1.25 mg by nebulization every 8 (eight) hours as needed for wheezing. 01/29/14   Midge Minium, MD  mometasone (NASONEX) 50 MCG/ACT nasal spray Place 2 sprays into the nose at bedtime.     Historical Provider, MD  Multiple Vitamin (MULTIVITAMIN) tablet Take 1 tablet by mouth every morning.    Historical Provider, MD  ondansetron (ZOFRAN) 4 MG tablet Take 1 tablet (4 mg total) by mouth every 6 (six) hours. 08/10/13   Saddie Benders. Ghim, MD  predniSONE (DELTASONE) 10 MG tablet Take 2 tablets (20 mg total) by mouth daily. 01/09/14   Glendell Docker, NP  predniSONE (STERAPRED UNI-PAK) 10 MG tablet Prednisone 10 mg take  4 each am x 2 days,   2 each am x 2 days,  1 each am x2days and  stop 02/10/14   Dorie Rank, MD  promethazine-dextromethorphan (PROMETHAZINE-DM) 6.25-15 MG/5ML syrup Take 5 mLs by mouth 4 (four) times daily as needed. 08/16/13   Midge Minium, MD  Sodium Chloride-Sodium Bicarb (AYR SALINE NASAL NETI RINSE NA) Rinse as needed for nasal congestion    Historical Provider, MD  vitamin C (ASCORBIC ACID) 500 MG tablet Take 500 mg by mouth every morning.    Historical Provider, MD   BP 166/92  Pulse 87  Temp(Src) 98.6 F (37 C) (Oral)  Resp 22  Ht 5\' 6"  (1.676 m)  Wt 127 lb (57.607 kg)  BMI 20.51 kg/m2  SpO2 100% Physical Exam  Nursing note and vitals reviewed. Constitutional: She appears well-developed and well-nourished. No distress.  HENT:  Head: Normocephalic and atraumatic.  Right Ear: External ear normal.  Left Ear: External ear normal.  Eyes: Conjunctivae are normal. Right eye exhibits no discharge. Left eye exhibits no discharge. No scleral icterus.  Neck: Neck supple. No tracheal deviation present.  Cardiovascular: Normal rate, regular rhythm and intact distal pulses.   Pulmonary/Chest: Accessory muscle usage present. No stridor. No respiratory distress. She has decreased breath sounds. She has no wheezes. She has no rhonchi. She has no rales.  Abdominal: Soft. Bowel sounds are normal. She exhibits no distension. There is no tenderness. There is no rebound and no guarding.  Musculoskeletal: She exhibits no edema and no tenderness.  Neurological: She is alert. She has normal strength. No cranial nerve deficit (no facial droop, extraocular movements intact, no slurred speech) or sensory deficit. She exhibits normal muscle tone. She displays no seizure activity. Coordination normal.  Skin: Skin is warm and dry. No rash noted.  Psychiatric: She has a normal mood and affect.    ED Course  Procedures (including critical care time) Labs Review Labs Reviewed  BASIC METABOLIC PANEL - Abnormal; Notable for the following:    Glucose, Bld 123 (*)     GFR calc non Af Amer 56 (*)    GFR calc Af Amer 65 (*)    All other components within normal limits  CBC - Abnormal; Notable for the following:    RBC 3.72 (*)    MCV 101.9 (*)    MCH 35.8 (*)    All other components within normal limits  PRO B NATRIURETIC PEPTIDE - Abnormal; Notable for the following:    Pro B Natriuretic peptide (BNP) 238.8 (*)    All other components within normal limits  D-DIMER, QUANTITATIVE  TROPONIN I    Imaging Review Dg Chest 2 View  02/10/2014   CLINICAL DATA:  Shortness  of breath, COPD, asthma.  EXAM: CHEST  2 VIEW  COMPARISON:  01/09/2014  FINDINGS: Mild hyperinflation of the lungs, stable. Heart and mediastinal contours are within normal limits. No focal opacities or effusions. No acute bony abnormality.  IMPRESSION: No active cardiopulmonary disease.   Electronically Signed   By: Rolm Baptise M.D.   On: 02/10/2014 15:55    EKG Normal sinus rhythm rate 92 Biatrial enlargement Septal infarct, age undetermined Possible lateral infarct, age undetermined The prior EKG for comparison  MDM   Final diagnoses:  COPD exacerbation   C/w copd exacerbation.  Doubt failure, chf or angina. 16  Pt is feeling better after treatment.  Able to speak in full sentences without any difficulty.  NO wheezing noted with full breath sounds on repeat exam.  Pt is tremulous after the breathing treatment but this is typical for her and that is why her doctor is trying to switch her to xopenex.  Will dc home with steroids.   Dorie Rank, MD 02/10/14 [phone]

## 2014-02-10 NOTE — Telephone Encounter (Signed)
Called to follow up with patient.  She was again panting in between words.  States that her son and his wife are present and helping patient to get dressed.  They plan to take patient to the [facility] (closest to patient).  Pt was encouraged to call EMS, but she stated that she would rather they take her and that she's only short of breath because she is up moving about.  Pt was encouraged that if she starts to feel any worse to call EMS.  Pt stated understanding and agreed.

## 2014-02-10 NOTE — Telephone Encounter (Signed)
C/o:  Shortness of breath.  Sound in distress over the phone.  Panting in between words.  States that it worsens when she gets up and moves around.   Shortness of breath resolves with rest.  During the call, pt sat down to describe what was going on with her.  It was evident that after she sat down and began to rest, her shortness of breath resolved.  She shared that she took Symbicort around 2:30 am this morning and albuterol around 10:15 am.  Experienced some relief, but then about an hour later (around 11:15, 11:30 am), patient got up up and began moving around and started feeling short of breath again.    Associated symptoms:  Has productive cough.  Describe sputum as thick and whitish gray in color. States that whenever she coughs it feels like she is about to lose her breath.  She also reports feeling extremely tired. She's not sleeping, eating or drinking well.  States her lips are parched and she feels thirsty.  She also threw up this morning after taking Mucinex.  Feels sweaty.  Jittery/shaking (possibly from albuterol).  Denies chest pain.  Hx: COPD, Anxiety Went to the ER on 01/09/14 for COPD Exacerbation Last OV:  01/29/14--for hospital follow up---Allergic Rhinitis and COPD---During this visit Dr. Birdie Riddle switched patient from Albuterol to Xopenex.  Pt never switched to Xopenex as she was unable to pick up from her pharmacy.  Still awaiting prior authorization.  Will follow up.     Advice:  Call son and have him take her to the ER.  Pt agreed with plan and stated she would.  Will call back to check up on patient.

## 2014-02-10 NOTE — ED Notes (Signed)
MD at bedside. 

## 2014-02-10 NOTE — Telephone Encounter (Signed)
If pt is that short of breath that she is unable to speak w/o panting, agree w/ recommendation to go to ER.  Pt also needs to f/u w/ pulmonologist

## 2014-02-10 NOTE — ED Notes (Signed)
Pt c/o SOB with exertion x 1 day HX COPD

## 2014-02-10 NOTE — Telephone Encounter (Signed)
[phone]  Pt is still waiting for the prior auth for this.  Pt states that insurance says they spoke with someone on 6/23 and they were still waiting for the forms to be completed.

## 2014-02-10 NOTE — BH Assessment (Deleted)
Patient accepted to [facility] by Dr. Samuel Germany, NP. The attending physician is Dr. Carlton Adam. The room assignment is 302-1. Support paperwork completed.

## 2014-02-11 ENCOUNTER — Telehealth: Payer: Self-pay | Admitting: General Practice

## 2014-02-11 NOTE — Telephone Encounter (Signed)
Received letter from [place] stating the pt's Xopenex HFA was approved.  Pharmacy is aware.//AB/CMA

## 2014-02-11 NOTE — Telephone Encounter (Signed)
Pt went to ER on 02/10/14.

## 2014-02-11 NOTE — Telephone Encounter (Signed)
Per Pharmacist this was approved. Disregard phone note .

## 2014-02-11 NOTE — Telephone Encounter (Signed)
Received a fax from pharmacy stating that a PA was needed for Xopenex nebulizer solution. Called [phone] to initiate PA. Was told that this was denied due to pt not residing in long-term facility. Determination letter would be sent to our office in 2 business days to determine how to proceed.

## 2014-02-17 ENCOUNTER — Encounter: Payer: Self-pay | Admitting: Family Medicine

## 2014-02-17 ENCOUNTER — Ambulatory Visit (INDEPENDENT_AMBULATORY_CARE_PROVIDER_SITE_OTHER): Payer: Medicare HMO | Admitting: Family Medicine

## 2014-02-17 VITALS — BP 130/60 | HR 88 | Temp 97.9°F | Wt 126.8 lb

## 2014-02-17 DIAGNOSIS — J42 Unspecified chronic bronchitis: Secondary | ICD-10-CM

## 2014-02-17 NOTE — Progress Notes (Signed)
   Subjective:    Patient ID: Jenna Marquez, female    DOB: 1945/11/26, 68 y.o.   MRN: [id number]  HPI COPD exacerbation- pt was seen in ER on 6/29 for this and was started on Prednisone taper x6 days.  Just started Xopenex after insurance approval.  Pt has not scheduled w/ pulmonary as recommended.  Currently no SOB- occurs mostly in the AM.  Using Symbicort but will forget evening dose until late and this pushes AM dose later ('must be 12 hrs').  No cough.   Review of Systems For ROS see HPI     Objective:   Physical Exam  Vitals reviewed. Constitutional: She is oriented to person, place, and time. She appears well-developed and well-nourished. No distress.  HENT:  Head: Normocephalic and atraumatic.  Neck: Normal range of motion. Neck supple.  Cardiovascular: Normal rate, regular rhythm, normal heart sounds and intact distal pulses.   Pulmonary/Chest: Effort normal. No respiratory distress. She has wheezes (scattered faint expiratory wheezes). She has no rales.  Lymphadenopathy:    She has no cervical adenopathy.  Neurological: She is alert and oriented to person, place, and time.  Skin: Skin is warm and dry.  Psychiatric: She has a normal mood and affect. Her behavior is normal. Thought content normal.          Assessment & Plan:

## 2014-02-17 NOTE — Patient Instructions (Signed)
Follow up as needed Call and schedule an appt w/ Dr Joya Gaskins (pulmonary doctor) Take your Symbicort twice daily- even if it hasn't been 12 hrs, take it AM and PM Use the Xopenex as needed- if no better after the 1st treatment, you can repeat Call with any questions or concerns Have a great summer!

## 2014-02-17 NOTE — Assessment & Plan Note (Signed)
Chronic problem.  Reviewed ER note from recent exacerbation.  Pt reports feeling well and doing better on the Xopenex- not nearly as shaky.  Having difficulty remembering her Symbicort in the evening and this is delaying her AM dose- causing her to have symptoms.  Reviewed that even if it is not exactly 12 hrs, she should take her Symbicort when she wakes in the AM.  Pt to schedule f/u w/ Pulmonary as she has not seen them in over a year.  Reviewed supportive care and red flags that should prompt return.  Pt expressed understanding and is in agreement w/ plan.

## 2014-02-17 NOTE — Progress Notes (Signed)
Pre visit review using our clinic review tool, if applicable. No additional management support is needed unless otherwise documented below in the visit note. 

## 2014-02-24 ENCOUNTER — Encounter: Payer: Self-pay | Admitting: Family Medicine

## 2014-03-13 ENCOUNTER — Ambulatory Visit: Payer: PRIVATE HEALTH INSURANCE | Admitting: Critical Care Medicine

## 2014-03-21 ENCOUNTER — Ambulatory Visit: Payer: PRIVATE HEALTH INSURANCE | Admitting: Critical Care Medicine

## 2014-05-01 ENCOUNTER — Ambulatory Visit (INDEPENDENT_AMBULATORY_CARE_PROVIDER_SITE_OTHER): Payer: PRIVATE HEALTH INSURANCE | Admitting: Critical Care Medicine

## 2014-05-01 ENCOUNTER — Encounter: Payer: Self-pay | Admitting: Critical Care Medicine

## 2014-05-01 VITALS — BP 123/73 | HR 88 | Temp 97.7°F | Ht 66.0 in | Wt 128.0 lb

## 2014-05-01 DIAGNOSIS — J42 Unspecified chronic bronchitis: Secondary | ICD-10-CM | POA: Diagnosis not present

## 2014-05-01 MED ORDER — BUDESONIDE-FORMOTEROL FUMARATE 160-4.5 MCG/ACT IN AERO: INHALATION_SPRAY | RESPIRATORY_TRACT | Status: DC

## 2014-05-01 NOTE — Patient Instructions (Signed)
Get your flu shot this fall Stay on symbicort two puff twice daily, put symbicort next to the toothbrush to remember to do this twice daily Return 6 months

## 2014-05-01 NOTE — Progress Notes (Signed)
Subjective:    Patient ID: Jenna Marquez, female    DOB: Apr 14, 1946, 68 y.o.   MRN: [id number]  HPI 05/01/2014 Chief Complaint  Patient presents with  . COPD    pt states she has some wheezing that is worse first thing in the morning.  She does not feel this  is any worse then before.  Pt states she is having her home painted and has some questions about this.    Notes is better since stopped smoking.  Pt notes more dyspnea in AM.  Not using symbicort twice daily Using xopenex prn.     Review of Systems Constitutional:   No  weight loss, night sweats,  Fevers, chills, fatigue, lassitude. HEENT:   No headaches,  Difficulty swallowing,  Tooth/dental problems,  Sore throat,                No sneezing, itching, ear ache, nasal congestion, post nasal drip,   CV:  No chest pain,  Orthopnea, PND, swelling in lower extremities, anasarca, dizziness, palpitations  GI  No heartburn, indigestion, abdominal pain, nausea, vomiting, diarrhea, change in bowel habits, loss of appetite  Resp: Note shortness of breath with exertion or at rest.  No excess mucus, no productive cough,  No non-productive cough,  No coughing up of blood.  No change in color of mucus.  No wheezing.  No chest wall deformity  Skin: no rash or lesions.  GU: no dysuria, change in color of urine, no urgency or frequency.  No flank pain.  MS:  No joint pain or swelling.  No decreased range of motion.  No back pain.  Psych:  No change in mood or affect. No depression or anxiety.  No memory loss.     Objective:   Physical Exam  Filed Vitals:   05/01/14 1012  BP: 123/73  Pulse: 88  Temp: 97.7 F (36.5 C)  TempSrc: Oral  Height: 5\' 6"  (1.676 m)  Weight: 128 lb (58.06 kg)  SpO2: 97%    Gen: Pleasant, well-nourished, in no distress,  normal affect  ENT: No lesions,  mouth clear,  oropharynx clear, no postnasal drip  Neck: No JVD, no TMG, no carotid bruits  Lungs: No use of accessory muscles, no dullness to  percussion, distant breath sounds  Cardiovascular: RRR, heart sounds normal, no murmur or gallops, no peripheral edema  Abdomen: soft and NT, no HSM,  BS normal  Musculoskeletal: No deformities, no cyanosis or clubbing  Neuro: alert, non focal  Skin: Warm, no lesions or rashes  No results found.       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  Gold stage C. COPD stable at this time Plan Get your flu shot this fall Stay on symbicort two puff twice daily, put symbicort next to the toothbrush to remember to do this twice daily Return 6 months    Updated Medication List Outpatient Encounter Prescriptions as of 05/01/2014  Medication Sig  . ALPRAZolam (XANAX) 0.5 MG tablet TAKE 1 TABLET BY MOUTH AT BEDTIME AS NEEDED FOR SLEEP  . budesonide-formoterol (SYMBICORT) 160-4.5 MCG/ACT inhaler Take 2 puffs first thing in am and then another 2 puffs about 12 hours later.  . Calcium Carb-Cholecalciferol (CALTRATE 600+D SOFT) 600-800 MG-UNIT CHEW Chew 1 tablet by mouth 2 (two) times daily.  Marland Kitchen guaiFENesin-codeine (ROBITUSSIN AC) 100-10 MG/5ML syrup Take 10 mLs by mouth 3 (three) times daily as needed for cough.  Marland Kitchen ibuprofen (ADVIL,MOTRIN) 800 MG  tablet TAKE 1 TABLET BY MOUTH EVERY 8 HOURS AS NEEDED  . levalbuterol (XOPENEX HFA) 45 MCG/ACT inhaler Inhale 2 puffs into the lungs every 6 (six) hours as needed for wheezing.  . levalbuterol (XOPENEX) 1.25 MG/3ML nebulizer solution Take 1.25 mg by nebulization every 8 (eight) hours as needed for wheezing.  . Multiple Vitamin (MULTIVITAMIN) tablet Take 1 tablet by mouth every morning.  . promethazine-dextromethorphan (PROMETHAZINE-DM) 6.25-15 MG/5ML syrup Take 5 mLs by mouth 4 (four) times daily as needed.  . [DISCONTINUED] budesonide-formoterol (SYMBICORT) 160-4.5 MCG/ACT inhaler Take 2 puffs first thing in am and then another 2 puffs about 12 hours later.  . mometasone (NASONEX) 50 MCG/ACT nasal spray Place 2  sprays into the nose at bedtime.

## 2014-05-02 NOTE — Assessment & Plan Note (Signed)
Gold stage C. COPD stable at this time Plan Get your flu shot this fall Stay on symbicort two puff twice daily, put symbicort next to the toothbrush to remember to do this twice daily Return 6 months

## 2014-05-05 ENCOUNTER — Encounter: Payer: Medicare HMO | Admitting: Family Medicine

## 2014-05-30 ENCOUNTER — Other Ambulatory Visit: Payer: Self-pay

## 2014-07-03 ENCOUNTER — Encounter (HOSPITAL_BASED_OUTPATIENT_CLINIC_OR_DEPARTMENT_OTHER): Payer: Self-pay | Admitting: *Deleted

## 2014-07-03 ENCOUNTER — Telehealth: Payer: Self-pay

## 2014-07-03 ENCOUNTER — Emergency Department (HOSPITAL_BASED_OUTPATIENT_CLINIC_OR_DEPARTMENT_OTHER): Payer: Medicare HMO

## 2014-07-03 ENCOUNTER — Emergency Department (HOSPITAL_BASED_OUTPATIENT_CLINIC_OR_DEPARTMENT_OTHER)
Admission: EM | Admit: 2014-07-03 | Discharge: 2014-07-03 | Disposition: A | Discharge status: Home / Self Care | Payer: Medicare HMO | Attending: Emergency Medicine | Admitting: Emergency Medicine

## 2014-07-03 DIAGNOSIS — J441 Chronic obstructive pulmonary disease with (acute) exacerbation: Secondary | ICD-10-CM | POA: Insufficient documentation

## 2014-07-03 DIAGNOSIS — F419 Anxiety disorder, unspecified: Secondary | ICD-10-CM | POA: Insufficient documentation

## 2014-07-03 DIAGNOSIS — R05 Cough: Secondary | ICD-10-CM | POA: Diagnosis present

## 2014-07-03 DIAGNOSIS — Z79899 Other long term (current) drug therapy: Secondary | ICD-10-CM | POA: Diagnosis not present

## 2014-07-03 DIAGNOSIS — Z87891 Personal history of nicotine dependence: Secondary | ICD-10-CM | POA: Diagnosis not present

## 2014-07-03 DIAGNOSIS — J40 Bronchitis, not specified as acute or chronic: Secondary | ICD-10-CM

## 2014-07-03 LAB — BASIC METABOLIC PANEL
Anion gap: 16 — ABNORMAL HIGH (ref 5–15)
BUN: 16 mg/dL (ref 6–23)
CHLORIDE: 105 meq/L (ref 96–112)
CO2: 24 mEq/L (ref 19–32)
CREATININE: 1 mg/dL (ref 0.50–1.10)
Calcium: 9.3 mg/dL (ref 8.4–10.5)
GFR calc Af Amer: 65 mL/min — ABNORMAL LOW (ref >90)
GFR calc non Af Amer: 56 mL/min — ABNORMAL LOW (ref >90)
Glucose, Bld: 123 mg/dL — ABNORMAL HIGH (ref 70–99)
Potassium: 4.2 mEq/L (ref 3.7–5.3)
Sodium: 145 mEq/L (ref 137–147)

## 2014-07-03 LAB — CBC WITH DIFFERENTIAL/PLATELET
BASOS ABS: 0 10*3/uL (ref 0.0–0.1)
BASOS PCT: 1 % (ref 0–1)
EOS ABS: 0 10*3/uL (ref 0.0–0.7)
Eosinophils Relative: 0 % (ref 0–5)
HCT: 35.6 % — ABNORMAL LOW (ref 36.0–46.0)
HEMOGLOBIN: 12.2 g/dL (ref 12.0–15.0)
Lymphocytes Relative: 13 % (ref 12–46)
Lymphs Abs: 1 10*3/uL (ref 0.7–4.0)
MCH: 35.8 pg — AB (ref 26.0–34.0)
MCHC: 34.3 g/dL (ref 30.0–36.0)
MCV: 104.4 fL — ABNORMAL HIGH (ref 78.0–100.0)
MONO ABS: 0.7 10*3/uL (ref 0.1–1.0)
MONOS PCT: 9 % (ref 3–12)
NEUTROS PCT: 77 % (ref 43–77)
Neutro Abs: 5.9 10*3/uL (ref 1.7–7.7)
Platelets: 206 10*3/uL (ref 150–400)
RBC: 3.41 MIL/uL — ABNORMAL LOW (ref 3.87–5.11)
RDW: 11.7 % (ref 11.5–15.5)
WBC: 7.7 10*3/uL (ref 4.0–10.5)

## 2014-07-03 LAB — D-DIMER, QUANTITATIVE: D-Dimer, Quant: <0.27 ug/mL-FEU (ref 0.00–0.48)

## 2014-07-03 MED ORDER — IPRATROPIUM BROMIDE 0.02 % IN SOLN
0.5000 mg | Freq: Once | RESPIRATORY_TRACT | Status: AC
  Administered 2014-07-03: 0.5 mg via RESPIRATORY_TRACT
  Filled 2014-07-03: qty 2.5

## 2014-07-03 MED ORDER — TRAMADOL HCL 50 MG PO TABS: 50.0000 mg | ORAL_TABLET | Freq: Four times a day (QID) | ORAL | Status: DC | PRN

## 2014-07-03 MED ORDER — PREDNISONE 20 MG PO TABS: ORAL_TABLET | ORAL | Status: DC

## 2014-07-03 MED ORDER — LEVALBUTEROL HCL 1.25 MG/0.5ML IN NEBU
1.2500 mg | INHALATION_SOLUTION | Freq: Once | RESPIRATORY_TRACT | Status: AC
Start: 2014-07-03 — End: 2014-07-03
  Administered 2014-07-03: 1.25 mg via RESPIRATORY_TRACT
  Filled 2014-07-03: qty 0.5

## 2014-07-03 MED ORDER — DOXYCYCLINE HYCLATE 100 MG PO CAPS: 100.0000 mg | ORAL_CAPSULE | Freq: Two times a day (BID) | ORAL | Status: DC

## 2014-07-03 NOTE — Telephone Encounter (Signed)
[facility] Triage Call Report Triage Record Num: [id number] Operator: Prentice Docker [place] Patient Name: Jenna Marquez Call Date & Time: 07/03/2014 6:35:55AM Patient Phone: [phone] PCP: Annye Asa ([facility]) Patient Gender: Female PCP Fax : Patient DOB: 07-17-1946 Practice Name: [facility]  Reason for Call: Caller: Jenna Marquez/Patient; PCP: Midge Minium.; CB#: [id number]; Call regarding Difficulty Breathing; 07/02/14 1130pm coughing and congestion. Took guaiatussin AC, and this helped her cough at 0200. Afebrile. Took her rescue inhailer now and is breathing better but still coughing. Slight wheezing. Chest is tight and coughing up light grey sputum. Back pain with coughing. Pt sounds very SOB. Emergent S&S identified per Asthma Adult Protocol: "having pain or aching in chest,neck,back,arm,shoulder or jaw along with asthma S&S." Advised to call 911. Caller will not call 911. States she will call a neighbor to take her to [facility].  Protocol(s) Used: Asthma - Adult Recommended Outcome per Protocol: Activate EMS 911 Reason for Outcome: Having pain or aching in chest, neck, back, arm, shoulder or jaw along with asthma symptoms.  Care Advice: ~ Do not give the patient anything to eat or drink. ~ The individual's asthma action plan should be taken with patient so it can be reviewed by provider. ~Write down provider's name. List or place the following in a bag for transport with the patient: current prescription and/or nonprescription medications; alternative treatments, therapies and medications; and street drugs. ~ Place person in a position of comfort and loosen tight clothing. ~An adult should stay with the patient, preferably one trained in CPR. If the person is not trained in CPR, then he or she should provide hands-only (compression-only) CPR as recommended by the American Heart Association.

## 2014-07-03 NOTE — Discharge Instructions (Signed)
Upper Respiratory Infection, Adult An upper respiratory infection (URI) is also sometimes known as the common cold. The upper respiratory tract includes the nose, sinuses, throat, trachea, and bronchi. Bronchi are the airways leading to the lungs. Most people improve within 1 week, but symptoms can last up to 2 weeks. A residual cough may last even longer.  CAUSES Many different viruses can infect the tissues lining the upper respiratory tract. The tissues become irritated and inflamed and often become very moist. Mucus production is also common. A cold is contagious. You can easily spread the virus to others by oral contact. This includes kissing, sharing a glass, coughing, or sneezing. Touching your mouth or nose and then touching a surface, which is then touched by another person, can also spread the virus. SYMPTOMS  Symptoms typically develop 1 to 3 days after you come in contact with a cold virus. Symptoms vary from person to person. They may include:  Runny nose.  Sneezing.  Nasal congestion.  Sinus irritation.  Sore throat.  Loss of voice (laryngitis).  Cough.  Fatigue.  Muscle aches.  Loss of appetite.  Headache.  Low-grade fever. DIAGNOSIS  You might diagnose your own cold based on familiar symptoms, since most people get a cold 2 to 3 times a year. Your caregiver can confirm this based on your exam. Most importantly, your caregiver can check that your symptoms are not due to another disease such as strep throat, sinusitis, pneumonia, asthma, or epiglottitis. Blood tests, throat tests, and X-rays are not necessary to diagnose a common cold, but they may sometimes be helpful in excluding other more serious diseases. Your caregiver will decide if any further tests are required. RISKS AND COMPLICATIONS  You may be at risk for a more severe case of the common cold if you smoke cigarettes, have chronic heart disease (such as heart failure) or lung disease (such as asthma), or if  you have a weakened immune system. The very young and very old are also at risk for more serious infections. Bacterial sinusitis, middle ear infections, and bacterial pneumonia can complicate the common cold. The common cold can worsen asthma and chronic obstructive pulmonary disease (COPD). Sometimes, these complications can require emergency medical care and may be life-threatening. PREVENTION  The best way to protect against getting a cold is to practice good hygiene. Avoid oral or hand contact with people with cold symptoms. Wash your hands often if contact occurs. There is no clear evidence that vitamin C, vitamin E, echinacea, or exercise reduces the chance of developing a cold. However, it is always recommended to get plenty of rest and practice good nutrition. TREATMENT  Treatment is directed at relieving symptoms. There is no cure. Antibiotics are not effective, because the infection is caused by a virus, not by bacteria. Treatment may include:  Increased fluid intake. Sports drinks offer valuable electrolytes, sugars, and fluids.  Breathing heated mist or steam (vaporizer or shower).  Eating chicken soup or other clear broths, and maintaining good nutrition.  Getting plenty of rest.  Using gargles or lozenges for comfort.  Controlling fevers with ibuprofen or acetaminophen as directed by your caregiver.  Increasing usage of your inhaler if you have asthma. Zinc gel and zinc lozenges, taken in the first 24 hours of the common cold, can shorten the duration and lessen the severity of symptoms. Pain medicines may help with fever, muscle aches, and throat pain. A variety of non-prescription medicines are available to treat congestion and runny nose. Your caregiver   can make recommendations and may suggest nasal or lung inhalers for other symptoms.  HOME CARE INSTRUCTIONS   Only take over-the-counter or prescription medicines for pain, discomfort, or fever as directed by your  caregiver.  Use a warm mist humidifier or inhale steam from a shower to increase air moisture. This may keep secretions moist and make it easier to breathe.  Drink enough water and fluids to keep your urine clear or pale yellow.  Rest as needed.  Return to work when your temperature has returned to normal or as your caregiver advises. You may need to stay home longer to avoid infecting others. You can also use a face mask and careful hand washing to prevent spread of the virus. SEEK MEDICAL CARE IF:   After the first few days, you feel you are getting worse rather than better.  You need your caregiver's advice about medicines to control symptoms.  You develop chills, worsening shortness of breath, or brown or red sputum. These may be signs of pneumonia.  You develop yellow or brown nasal discharge or pain in the face, especially when you bend forward. These may be signs of sinusitis.  You develop a fever, swollen neck glands, pain with swallowing, or white areas in the back of your throat. These may be signs of strep throat. SEEK IMMEDIATE MEDICAL CARE IF:   You have a fever.  You develop severe or persistent headache, ear pain, sinus pain, or chest pain.  You develop wheezing, a prolonged cough, cough up blood, or have a change in your usual mucus (if you have chronic lung disease).  You develop sore muscles or a stiff neck. Document Released: 01/25/2001 Document Revised: 10/24/2011 Document Reviewed: 11/06/2013 ExitCare Patient Information 2015 [employer]. This information is not intended to replace advice given to you by your health care provider. Make sure you discuss any questions you have with your health care provider.  

## 2014-07-03 NOTE — ED Notes (Signed)
Patient transported to X-ray 

## 2014-07-03 NOTE — Telephone Encounter (Signed)
FYI

## 2014-07-03 NOTE — ED Notes (Signed)
Pt amb to room 6 with slow, steady gait in nad. Pt reports cough and congestion x this am, states she felt fine yesterday. Denies fevers, states her chest is sore, and only hurts when she coughs, "sharp". ekg performed while pt being triaged.

## 2014-07-03 NOTE — ED Provider Notes (Signed)
CSN: [id number]     Arrival date & time 07/03/14  1607 History   First MD Initiated Contact with Patient 07/03/14 [phone]     Chief Complaint  Patient presents with  . Cough     (Consider location/radiation/quality/duration/timing/severity/associated sxs/prior Treatment) HPI Comments: Patient presents with a cough. She has a history of COPD. She's had a worsening cough over the last 2 days. She denies any vomiting. There is no fevers. She has had some increased wheezing and shortness of breath with the cough. She denies any chest pain. She's also had some nasal congestion. She's been using her inhalers at home without improvement in symptoms. She denies any leg pain or swelling. She is not oxygen dependent.   Past Medical History  Diagnosis Date  . COPD (chronic obstructive pulmonary disease)   . Asthma   . Allergic rhinitis   . Anxiety    Past Surgical History  Procedure Laterality Date  . Ectopic pregnancy surgery  1974  . Tonsillectomy and adenoidectomy  1954   Family History  Problem Relation Age of Onset  . Coronary artery disease Mother   . Heart disease Mother     CHF  . Diabetes Neg Hx   . Cancer Neg Hx   . Stroke Neg Hx   . Colon cancer Neg Hx    History  Substance Use Topics  . Smoking status: Former Smoker -- 1.00 packs/day for 35 years    Types: Cigarettes    Quit date: 08/10/2012  . Smokeless tobacco: Never Used     Comment: Started at age 32  . Alcohol Use: Yes     Comment: socially   OB History    No data available     Review of Systems  Constitutional: Negative for fever, chills and diaphoresis.  HENT: Positive for congestion. Negative for rhinorrhea and sneezing.   Eyes: Negative.   Respiratory: Positive for cough, shortness of breath and wheezing. Negative for chest tightness.   Cardiovascular: Negative for chest pain and leg swelling.  Gastrointestinal: Negative for nausea, vomiting, abdominal pain, diarrhea and blood in stool.  Genitourinary:  Negative for frequency, hematuria, flank pain and difficulty urinating.  Musculoskeletal: Negative for back pain and arthralgias.  Skin: Negative for rash.  Neurological: Negative for dizziness, speech difficulty, weakness, numbness and headaches.      Allergies  Avelox  Home Medications   Prior to Admission medications   Medication Sig Start Date End Date Taking? Authorizing Provider  ALPRAZolam Duanne Moron) 0.5 MG tablet TAKE 1 TABLET BY MOUTH AT BEDTIME AS NEEDED FOR SLEEP 10/31/13   Midge Minium, MD  budesonide-formoterol [facility]) 160-4.5 MCG/ACT inhaler Take 2 puffs first thing in am and then another 2 puffs about 12 hours later. 05/01/14   Elsie Stain, MD  Calcium Carb-Cholecalciferol (CALTRATE 600+D SOFT) 600-800 MG-UNIT CHEW Chew 1 tablet by mouth 2 (two) times daily.    Historical Provider, MD  doxycycline (VIBRAMYCIN) 100 MG capsule Take 1 capsule (100 mg total) by mouth 2 (two) times daily. One po bid x 7 days 07/03/14   Malvin Johns, MD  guaiFENesin-codeine (ROBITUSSIN AC) 100-10 MG/5ML syrup Take 10 mLs by mouth 3 (three) times daily as needed for cough. 08/16/13   Midge Minium, MD  ibuprofen (ADVIL,MOTRIN) 800 MG tablet TAKE 1 TABLET BY MOUTH EVERY 8 HOURS AS NEEDED    Midge Minium, MD  levalbuterol [facility]) 45 MCG/ACT inhaler Inhale 2 puffs into the lungs every 6 (six) hours as needed  for wheezing. 01/29/14   Midge Minium, MD  levalbuterol Penne Lash) 1.25 MG/3ML nebulizer solution Take 1.25 mg by nebulization every 8 (eight) hours as needed for wheezing. 01/29/14   Midge Minium, MD  mometasone (NASONEX) 50 MCG/ACT nasal spray Place 2 sprays into the nose at bedtime.     Historical Provider, MD  Multiple Vitamin (MULTIVITAMIN) tablet Take 1 tablet by mouth every morning.    Historical Provider, MD  predniSONE (DELTASONE) 20 MG tablet 3 tabs po day one, then 2 po daily x 4 days 07/03/14   Malvin Johns, MD  promethazine-dextromethorphan  (PROMETHAZINE-DM) 6.25-15 MG/5ML syrup Take 5 mLs by mouth 4 (four) times daily as needed. 08/16/13   Midge Minium, MD  traMADol (ULTRAM) 50 MG tablet Take 1 tablet (50 mg total) by mouth every 6 (six) hours as needed. 07/03/14   Malvin Johns, MD   BP 160/49 mmHg  Pulse 103  Temp(Src) 98.4 F (36.9 C) (Oral)  Resp 16  SpO2 99% Physical Exam  Constitutional: She is oriented to person, place, and time. She appears well-developed and well-nourished.  HENT:  Head: Normocephalic and atraumatic.  Eyes: Pupils are equal, round, and reactive to light.  Neck: Normal range of motion. Neck supple.  Cardiovascular: Normal rate, regular rhythm and normal heart sounds.   Pulmonary/Chest: Effort normal. No respiratory distress. She has wheezes. She has no rales. She exhibits no tenderness.  Patient has rhonchi and expiratory wheezes bilaterally. There is no increased work of breathing.  Abdominal: Soft. Bowel sounds are normal. There is no tenderness. There is no rebound and no guarding.  Musculoskeletal: Normal range of motion. She exhibits no edema.  No calf tenderness  Lymphadenopathy:    She has no cervical adenopathy.  Neurological: She is alert and oriented to person, place, and time.  Skin: Skin is warm and dry. No rash noted.  Psychiatric: She has a normal mood and affect.    ED Course  Procedures (including critical care time) Labs Review Labs Reviewed  BASIC METABOLIC PANEL - Abnormal; Notable for the following:    Glucose, Bld 123 (*)    GFR calc non Af Amer 56 (*)    GFR calc Af Amer 65 (*)    Anion gap 16 (*)    All other components within normal limits  CBC WITH DIFFERENTIAL - Abnormal; Notable for the following:    RBC 3.41 (*)    HCT 35.6 (*)    MCV 104.4 (*)    MCH 35.8 (*)    All other components within normal limits  D-DIMER, QUANTITATIVE    Imaging Review Dg Chest 2 View  07/03/2014   CLINICAL DATA:  Productive cough. Right upper back pain. History of COPD  and asthma.  EXAM: CHEST  2 VIEW  COMPARISON:  02/10/2014  FINDINGS: Multiple telemetry leads overlie the chest. Cardiomediastinal silhouette is within normal limits. Lungs remain hyperinflated, unchanged. 1 cm rounded density projecting in the right lung base is more conspicuous than on the most recent prior examination but is unchanged from 12/31/2011 and is consistent with a nipple shadow. Otherwise, the lungs are without airspace consolidation, edema, pleural effusion, or pneumothorax. No acute osseous abnormality is identified.  IMPRESSION: No active cardiopulmonary disease.   Electronically Signed   By: Logan Bores   On: 07/03/2014 09:56     EKG Interpretation None      MDM   Final diagnoses:  Bronchitis    There is no evidence of pneumonia. Patient  was given nebulizer treatment in the ED with improvement of symptoms. She was discharged with a prednisone burst as well as doxycycline given her recent change in symptoms. She was encouraged to have close follow-up with her primary care physician. Her oxygen saturations are normal and she has no increased work of breathing.    Malvin Johns, MD 07/03/14 [phone]

## 2014-07-03 NOTE — Telephone Encounter (Signed)
Spoke with patient who states that her neighbor just arrived at her house and they are on their way to ER.

## 2014-07-03 NOTE — ED Notes (Signed)
Friend on her way to pick patient up

## 2014-07-03 NOTE — ED Notes (Signed)
Pt states she will get flu shot after she feels better.

## 2014-07-04 ENCOUNTER — Telehealth: Payer: Self-pay | Admitting: Critical Care Medicine

## 2014-07-04 NOTE — Telephone Encounter (Signed)
-----   Message from Malvin Johns, MD sent at 07/03/2014 12:44 PM EST ----- Regarding: Order for [facility]  Patient Name: Jenna Marquez,Jenna Marquez([id number]) Sex: Female DOB: 25-Sep-1945    PCP: TABORI, [place]: [place]   Types of orders made on 07/03/2014: Consult, ECG, Imaging, Lab, Medications,                                      Nursing  Order Date:07/03/2014 Ordering [place], [place] Attending Provider:Melanie Tamera Punt, MD [phone] Authorizing Provider: Malvin Johns, MD [phone] Department:[facility]EMERGENCY DEPT [facility][[id number]]  Order Specific Information Order: COPD clinic follow up [Custom: [id number]]  Order #: [id number]  Qty: 1   Priority: Routine  Class: Hospital Performed     Where does the patient receive follow up COPD care -> [place]       Follow up in -> 5-7 days     Released on: 07/03/2014 12:44 PM     Priority: Routine  Class: Hospital Performed     Where does the patient receive follow up COPD care -> [place]       Follow up in -> 5-7 days     Released on: 07/03/2014 12:44 PM

## 2014-07-04 NOTE — Telephone Encounter (Signed)
Pt refused appt.  Nothing further needed.  Jenna Marquez

## 2014-07-09 ENCOUNTER — Telehealth: Payer: Self-pay | Admitting: Family Medicine

## 2014-07-09 NOTE — Telephone Encounter (Signed)
Caller name: Reann Relation to pt: self Call back number: [phone] Pharmacy:  Reason for call:   Patient states that she is not feeling any better since her ED visit. She is finished with prednisone and she has 3 pills left of doxycline. Patient is taking cough medicine from last visit. She does not want to come out in the cold weather and wants to know what she should do? She is still coughing and wheezing.

## 2014-07-09 NOTE — Telephone Encounter (Signed)
Notified pt of below recommendation and she became very upset. States that she did not refuse pulmonology appt but had seen pulmonology prior to ER visit and she did not want to go back out into the cold weather with her current symptoms. Pt then states she did not understand why we were giving her recommendations because she did not call and ask what to do she only wanted to reschedule her physical and she would contact her pulmonologist as she felt they were better qualified to treat her condition.  Verified with pt that she was not seeking additional medical advice at this time and to follow up with pulmonology.

## 2014-07-09 NOTE — Telephone Encounter (Signed)
Pt refused appt w/ Pulmonary after her ER visit.  She should complete Doxy as directed.  Continue her Symbicort and use the Xopenex every 4 hrs for cough/shortness of breath/wheezing.  If she is having severe symptoms, she needs to go back to ER.  Otherwise, she can be evaluated either this evening by PA or Friday at [facility] or BEST option would be to call Pulmonary

## 2014-07-09 NOTE — Telephone Encounter (Signed)
LM for patient to reurn the call

## 2014-07-11 ENCOUNTER — Telehealth: Payer: Self-pay | Admitting: Critical Care Medicine

## 2014-07-11 MED ORDER — PREDNISONE 10 MG PO TABS: ORAL_TABLET | ORAL | Status: DC

## 2014-07-11 MED ORDER — CLARITHROMYCIN 500 MG PO TABS: 500.0000 mg | ORAL_TABLET | Freq: Two times a day (BID) | ORAL | Status: DC

## 2014-07-11 NOTE — Telephone Encounter (Signed)
Called spoke with pt. Aware of recs. RX sent in. Nothing further needed 

## 2014-07-11 NOTE — Telephone Encounter (Signed)
Call in biaxin 500mg  bid x 7days #14 Prednisone 10mg  Take 4 for three days 3 for three days 2 for three days 1 for three days and stop #30 OV next week with NP

## 2014-07-11 NOTE — Telephone Encounter (Signed)
Called spoke with pt. She was seen in UC 11/19 and given Doxy and prednisone, DX w/ bronchitis.  Pt reports she is done with those medications and is still wheezing, prod cough (thick clear-grey phlem), chest tx, chest tx, increase SOB w/ exertion (fine at rest). She reports symbicort is only lasting for 5 hrs and then having to use her rescue inhaler. She is scheduled for acute visit 11/30 w/ TP. She would like something called in until she can be seen. Please advise PW thanks  Allergies  Allergen Reactions  . Avelox [Moxifloxacin Hcl In Nacl] Itching and Rash

## 2014-07-14 ENCOUNTER — Encounter: Payer: Self-pay | Admitting: Adult Health

## 2014-07-14 ENCOUNTER — Ambulatory Visit (INDEPENDENT_AMBULATORY_CARE_PROVIDER_SITE_OTHER): Payer: Medicare HMO | Admitting: Adult Health

## 2014-07-14 VITALS — BP 134/84 | HR 92 | Temp 97.1°F | Ht 66.0 in | Wt 132.4 lb

## 2014-07-14 DIAGNOSIS — J42 Unspecified chronic bronchitis: Secondary | ICD-10-CM

## 2014-07-14 MED ORDER — TIOTROPIUM BROMIDE MONOHYDRATE 18 MCG IN CAPS: 18.0000 ug | ORAL_CAPSULE | Freq: Every day | RESPIRATORY_TRACT | Status: DC

## 2014-07-14 NOTE — Assessment & Plan Note (Signed)
Exacerbation-slow to resolve  Was previously on Spiriva in past    Plan  Finish Biaxin and Prednisone as directed.  Mucinex DM Twice daily  As needed  Cough/congestion  Continue on Symbicort 2 puffs Twice daily   Restart Spiriva 1 puff daily  Follow up Dr. Joya Gaskins  In 6 weeks and As needed   Please contact office for sooner follow up if symptoms do not improve or worsen or seek emergency care

## 2014-07-14 NOTE — Patient Instructions (Addendum)
Finish Biaxin and Prednisone as directed.  Mucinex DM Twice daily  As needed  Cough/congestion  Continue on Symbicort 2 puffs Twice daily   Restart Spiriva 1 puff daily  Follow up Dr. Joya Gaskins  In 6 weeks and As needed   Please contact office for sooner follow up if symptoms do not improve or worsen or seek emergency care

## 2014-07-14 NOTE — Progress Notes (Signed)
Subjective:    Patient ID: Jenna Marquez, female    DOB: 09-22-1945, 68 y.o.   MRN: [id number]  HPI  68 yo female with COPD    07/14/2014 Acute OV  Complains of wheezing, increased SOB, prod cough with gray/white mucus, head congestion with PND x2 weeks. Was seen in ER given abx and prednisone taper. Did not see much improvement . CXR was neg for acute process.  Over last 4 days with increased cough and wheezing .Called in to office with Biaxin and Prednisone was called . She is starting to feel better.  She denies any chest pain, orthopnea, PND, leg swelling, or fever   Review of Systems Constitutional:   No  weight loss, night sweats,  Fevers, chills,+ fatigue, lassitude. HEENT:   No headaches,  Difficulty swallowing,  Tooth/dental problems,  Sore throat,                No sneezing, itching, ear ache,+ nasal congestion, post nasal drip,   CV:  No chest pain,  Orthopnea, PND, swelling in lower extremities, anasarca, dizziness, palpitations  GI  No heartburn, indigestion, abdominal pain, nausea, vomiting, diarrhea, change in bowel habits, loss of appetite  Resp:    No chest wall deformity  Skin: no rash or lesions.  GU: no dysuria, change in color of urine, no urgency or frequency.  No flank pain.  MS:  No joint pain or swelling.  No decreased range of motion.  No back pain.  Psych:  No change in mood or affect. No depression or anxiety.  No memory loss.     Objective:   Physical Exam  Filed Vitals:   07/14/14 1646  BP: 134/84  Pulse: 92  Temp: 97.1 F (36.2 C)  TempSrc: Oral  Height: 5\' 6"  (1.676 m)  Weight: 132 lb 6.4 oz (60.056 kg)  SpO2: 97%    Gen: Pleasant, well-nourished, in no distress,  normal affect  ENT: No lesions,  mouth clear,  oropharynx clear, no postnasal drip  Neck: No JVD, no TMG, no carotid bruits  Lungs: No use of accessory muscles, no dullness to percussion, few tr wheezes   Cardiovascular: RRR, heart sounds normal, no murmur or  gallops, no peripheral edema  Abdomen: soft and NT, no HSM,  BS normal  Musculoskeletal: No deformities, no cyanosis or clubbing  Neuro: alert, non focal  Skin: Warm, no lesions or rashes  No results found.       Assessment & Plan:   No problem-specific assessment & plan notes found for this encounter.   Updated Medication List Outpatient Encounter Prescriptions as of 07/14/2014  Medication Sig  . ALPRAZolam (XANAX) 0.5 MG tablet TAKE 1 TABLET BY MOUTH AT BEDTIME AS NEEDED FOR SLEEP  . budesonide-formoterol (SYMBICORT) 160-4.5 MCG/ACT inhaler Take 2 puffs first thing in am and then another 2 puffs about 12 hours later.  . Calcium Carb-Cholecalciferol (CALTRATE 600+D SOFT) 600-800 MG-UNIT CHEW Chew 1 tablet by mouth 2 (two) times daily.  . clarithromycin (BIAXIN) 500 MG tablet Take 1 tablet (500 mg total) by mouth 2 (two) times daily.  Marland Kitchen guaiFENesin-codeine (ROBITUSSIN AC) 100-10 MG/5ML syrup Take 10 mLs by mouth 3 (three) times daily as needed for cough.  Marland Kitchen ibuprofen (ADVIL,MOTRIN) 800 MG tablet TAKE 1 TABLET BY MOUTH EVERY 8 HOURS AS NEEDED  . levalbuterol (XOPENEX HFA) 45 MCG/ACT inhaler Inhale 2 puffs into the lungs every 6 (six) hours as needed for wheezing.  . levalbuterol (XOPENEX) 1.25 MG/3ML nebulizer solution Take  1.25 mg by nebulization every 8 (eight) hours as needed for wheezing.  . Multiple Vitamin (MULTIVITAMIN) tablet Take 1 tablet by mouth every morning.  . predniSONE (DELTASONE) 10 MG tablet Take as directed by Dr Joya Gaskins  . traMADol (ULTRAM) 50 MG tablet Take 1 tablet (50 mg total) by mouth every 6 (six) hours as needed. (Patient not taking: Reported on 07/14/2014)  . [DISCONTINUED] doxycycline (VIBRAMYCIN) 100 MG capsule Take 1 capsule (100 mg total) by mouth 2 (two) times daily. One po bid x 7 days (Patient not taking: Reported on 07/14/2014)  . [DISCONTINUED] mometasone (NASONEX) 50 MCG/ACT nasal spray Place 2 sprays into the nose at bedtime.   . [DISCONTINUED]  predniSONE (DELTASONE) 10 MG tablet Take 4 tabs daily x 3 days, 3 tabs daily x 3 days, 2 tabs daily x 3 days, 1 tab daily x 3 days then stop (Patient not taking: Reported on 07/14/2014)  . [DISCONTINUED] predniSONE (DELTASONE) 20 MG tablet 3 tabs po day one, then 2 po daily x 4 days (Patient not taking: Reported on 07/14/2014)  . [DISCONTINUED] promethazine-dextromethorphan (PROMETHAZINE-DM) 6.25-15 MG/5ML syrup Take 5 mLs by mouth 4 (four) times daily as needed. (Patient not taking: Reported on 07/14/2014)

## 2014-07-30 ENCOUNTER — Other Ambulatory Visit: Payer: Self-pay | Admitting: Family Medicine

## 2014-07-30 NOTE — Telephone Encounter (Signed)
Med filled and faxed.  

## 2014-07-30 NOTE — Telephone Encounter (Signed)
Last OV 02-17-14 Alprazolam last filled 10-31-13 #30 with 0  CPE on 10-2014.

## 2014-08-14 ENCOUNTER — Encounter: Payer: Medicare HMO | Admitting: Family Medicine

## 2014-08-28 ENCOUNTER — Ambulatory Visit (INDEPENDENT_AMBULATORY_CARE_PROVIDER_SITE_OTHER): Payer: Medicare HMO | Admitting: Critical Care Medicine

## 2014-08-28 ENCOUNTER — Encounter: Payer: Self-pay | Admitting: Critical Care Medicine

## 2014-08-28 VITALS — BP 137/76 | HR 90 | Temp 98.0°F | Ht 66.0 in | Wt 131.0 lb

## 2014-08-28 DIAGNOSIS — J418 Mixed simple and mucopurulent chronic bronchitis: Secondary | ICD-10-CM

## 2014-08-28 DIAGNOSIS — Z23 Encounter for immunization: Secondary | ICD-10-CM

## 2014-08-28 MED ORDER — GUAIFENESIN-CODEINE 100-10 MG/5ML PO SYRP: 10.0000 mL | ORAL_SOLUTION | Freq: Three times a day (TID) | ORAL | Status: DC | PRN

## 2014-08-28 MED ORDER — FLUTTER DEVI: Status: DC

## 2014-08-28 NOTE — Progress Notes (Signed)
Subjective:    Patient ID: Jenna Marquez, female    DOB: May 26, 1946, 69 y.o.   MRN: [id number]  HPI  69 yo female with COPD     08/28/2014 Chief Complaint  Patient presents with  . Follow-up    Pt states she is still having alot of mucus that she coughs up, that is worse at night. She states it is mostly white in color.  Pt is asking for a refill on robutussin AC.   Pt notes some cough late at night when lay flat.  Mucus is white in color.  Mucus is not thick.  Mucus not as gray.   Pt went to ED 07/03/14.  Pt rx ABX and pred and had an extension of same after thanksgiving.  Now is better. Spiriva has helped and cough syrup helps Pt denies any significant sore throat, nasal congestion or excess secretions, fever, chills, sweats, unintended weight loss, pleurtic or exertional chest pain, orthopnea PND, or leg swelling Pt denies any increase in rescue therapy over baseline, denies waking up needing it or having any early am or nocturnal exacerbations of coughing/wheezing/or dyspnea. Pt also denies any obvious fluctuation in symptoms with  weather or environmental change or other alleviating or aggravating factors     Review of Systems Constitutional:   No  weight loss, night sweats,  Fevers, chills,+ fatigue, lassitude. HEENT:   No headaches,  Difficulty swallowing,  Tooth/dental problems,  Sore throat,                No sneezing, itching, ear ache,+ nasal congestion, post nasal drip,   CV:  No chest pain,  Orthopnea, PND, swelling in lower extremities, anasarca, dizziness, palpitations  GI  No heartburn, indigestion, abdominal pain, nausea, vomiting, diarrhea, change in bowel habits, loss of appetite  Resp:    No chest wall deformity  Skin: no rash or lesions.  GU: no dysuria, change in color of urine, no urgency or frequency.  No flank pain.  MS:  No joint pain or swelling.  No decreased range of motion.  No back pain.  Psych:  No change in mood or affect. No depression or  anxiety.  No memory loss.     Objective:   Physical Exam  Filed Vitals:   08/28/14 1004  BP: 137/76  Pulse: 90  Temp: 98 F (36.7 C)  TempSrc: Oral  Height: 5\' 6"  (1.676 m)  Weight: 131 lb (59.421 kg)  SpO2: 97%    Gen: Pleasant, well-nourished, in no distress,  normal affect  ENT: No lesions,  mouth clear,  oropharynx clear, no postnasal drip  Neck: No JVD, no TMG, no carotid bruits  Lungs: No use of accessory muscles, no dullness to percussion, few tr wheezes   Cardiovascular: RRR, heart sounds normal, no murmur or gallops, no peripheral edema  Abdomen: soft and NT, no HSM,  BS normal  Musculoskeletal: No deformities, no cyanosis or clubbing  Neuro: alert, non focal  Skin: Warm, no lesions or rashes  No results found.       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 Copd Gold C stable at present Plan Flutter valve qid Cont in haled meds     Updated Medication List Outpatient Encounter Prescriptions as of 08/28/2014  Medication Sig  . ALPRAZolam (XANAX) 0.5 MG tablet TAKE 1 TABLET BY MOUTH EVERY NIGHT AT BEDTIME AS NEEDED FOR SLEEP  . budesonide-formoterol (SYMBICORT) 160-4.5 MCG/ACT inhaler Take 2 puffs first thing  in am and then another 2 puffs about 12 hours later.  . Calcium Carb-Cholecalciferol (CALTRATE 600+D SOFT) 600-800 MG-UNIT CHEW Chew 1 tablet by mouth 2 (two) times daily.  Marland Kitchen guaiFENesin-codeine (ROBITUSSIN AC) 100-10 MG/5ML syrup Take 10 mLs by mouth 3 (three) times daily as needed for cough.  Marland Kitchen ibuprofen (ADVIL,MOTRIN) 800 MG tablet TAKE 1 TABLET BY MOUTH EVERY 8 HOURS AS NEEDED  . levalbuterol (XOPENEX HFA) 45 MCG/ACT inhaler Inhale 2 puffs into the lungs every 6 (six) hours as needed for wheezing.  . levalbuterol (XOPENEX) 1.25 MG/3ML nebulizer solution Take 1.25 mg by nebulization every 8 (eight) hours as needed for wheezing.  . Multiple Vitamin (MULTIVITAMIN) tablet Take 1 tablet by mouth every  morning.  . tiotropium (SPIRIVA) 18 MCG inhalation capsule Place 1 capsule (18 mcg total) into inhaler and inhale daily.  . traMADol (ULTRAM) 50 MG tablet Take 1 tablet (50 mg total) by mouth every 6 (six) hours as needed.  . [DISCONTINUED] guaiFENesin-codeine (ROBITUSSIN AC) 100-10 MG/5ML syrup Take 10 mLs by mouth 3 (three) times daily as needed for cough.  Marland Kitchen Respiratory Therapy Supplies (FLUTTER) DEVI Use 3-4 times per day  . [DISCONTINUED] clarithromycin (BIAXIN) 500 MG tablet Take 1 tablet (500 mg total) by mouth 2 (two) times daily.  . [DISCONTINUED] predniSONE (DELTASONE) 10 MG tablet Take as directed by Dr Joya Gaskins

## 2014-08-28 NOTE — Patient Instructions (Signed)
Use flutter valve 3-4 times per day No other changes Return 4 months

## 2014-08-29 NOTE — Assessment & Plan Note (Signed)
Copd Gold C stable at present Plan Flutter valve qid Cont in haled meds

## 2014-10-23 ENCOUNTER — Telehealth: Payer: Self-pay | Admitting: Family Medicine

## 2014-10-23 NOTE — Telephone Encounter (Signed)
Patient Name: Jenna Marquez  DOB: April 21, 1946    Initial Comment Caller states, vomiting and diarrhea today since 1 pm, has taken all the Imodium she can take today , she has chills now, A very small amount of urine output, Rt flank pains now too.    Nurse Assessment  Nurse: Mallie Mussel, RN, Alveta Heimlich Date/Time Eilene Ghazi Time): 10/23/2014 2:52:09 PM  Confirm and document reason for call. If symptomatic, describe symptoms. ---Caller states that she has diarrhea today x 6-7. She has vomited x 2. She has had very little urine output today. She rates her pain as 2 on 0-10 scale. She states that she is freezing.  Has the patient traveled out of the country within the last 30 days? ---No  Does the patient require triage? ---Yes  Related visit to physician within the last 2 weeks? ---No  Does the PT have any chronic conditions? (i.e. diabetes, asthma, etc.) ---Yes  List chronic conditions. ---COPD     Guidelines    Guideline Title Affirmed Question Affirmed Notes  Diarrhea Patient sounds very sick or weak to the triager only a small amount of urine output   Final Disposition User   Go to ED Now (or PCP triage) Mallie Mussel, RN, [name] states that she was told that they do not have any appointments available. I recommended she go to ER due to her age and "only a little urine output."

## 2014-10-24 NOTE — Telephone Encounter (Signed)
Called patient to check on her.  She states that she did not go to ED because her daughter's a nurse and she came over.  She states she has been able to keep down drinks and diarrhea has slowed with immodium.  Last time vomiting was around 11:00 yesterday after lunch.  Patient continues to report dizziness every time she stands up.  Her current BP is 139/71.  She is having abdominal cramping and arm cramping. She states that her family and friends are worried about her when they visit.    Recommended to patient that she go to the ED as previously advised (patient has friend who has offered to drive her).   Patient stated understanding and agrees.

## 2014-10-28 ENCOUNTER — Telehealth: Payer: Self-pay | Admitting: *Deleted

## 2014-10-28 NOTE — Telephone Encounter (Signed)
Patient unavailable at time of Pre-Visit Call.

## 2014-10-29 ENCOUNTER — Ambulatory Visit (INDEPENDENT_AMBULATORY_CARE_PROVIDER_SITE_OTHER): Payer: Medicare HMO | Admitting: Family Medicine

## 2014-10-29 ENCOUNTER — Encounter: Payer: Self-pay | Admitting: Family Medicine

## 2014-10-29 VITALS — BP 124/84 | HR 75 | Temp 97.9°F | Resp 16 | Ht 66.0 in | Wt 131.5 lb

## 2014-10-29 DIAGNOSIS — J438 Other emphysema: Secondary | ICD-10-CM

## 2014-10-29 DIAGNOSIS — Z79899 Other long term (current) drug therapy: Secondary | ICD-10-CM

## 2014-10-29 DIAGNOSIS — E559 Vitamin D deficiency, unspecified: Secondary | ICD-10-CM

## 2014-10-29 DIAGNOSIS — Z0000 Encounter for general adult medical examination without abnormal findings: Secondary | ICD-10-CM | POA: Diagnosis not present

## 2014-10-29 NOTE — Progress Notes (Signed)
Pre visit review using our clinic review tool, if applicable. No additional management support is needed unless otherwise documented below in the visit note. 

## 2014-10-29 NOTE — Patient Instructions (Signed)
Follow up in 1 year or as needed We'll notify you of your lab results and make any changes if needed Try and get regular exercise Schedule your mammogram and bone density for this summer Call with any questions or concerns Happy [place] Birthday!!

## 2014-10-29 NOTE — Progress Notes (Signed)
   Subjective:    Patient ID: Florentina Jenny, female    DOB: 05/08/1946, 69 y.o.   MRN: [id number]  HPI Here today for CPE.  Risk Factors: COPD- chronic problem, following w/ Dr Joya Gaskins. Physical Activity: not exercising Fall Risk: low risk Depression: denies current sxs Hearing: normal to conversational tones and whispered voice at 6 ft ADL's: independent Cognitive: normal linear thought process, memory and attention intact Home Safety: lives alone, feeling safe at home- alarm system and neighbors Height, Weight, BMI, Visual Acuity: see vitals, vision corrected to 20/20 w/ glasses Counseling: UTD on mammo (July), DEXA, colonoscopy 2014.  No need for paps.  Due for Prevnar Labs Ordered: See A&P Care Plan: See A&P    Review of Systems Patient reports no vision/ hearing changes, adenopathy,fever, weight change,  persistant/recurrent hoarseness , swallowing issues, chest pain, palpitations, edema, persistant/recurrent cough, hemoptysis, dyspnea (rest/exertional/paroxysmal nocturnal), gastrointestinal bleeding (melena, rectal bleeding), abdominal pain, bowel changes, GU symptoms (dysuria, hematuria, incontinence), Gyn symptoms (abnormal  bleeding, pain),  syncope, focal weakness, memory loss, numbness & tingling, hair/nail changes, abnormal bruising or bleeding, anxiety, or depression.   + GERD- taking OTC Nexium w/ good control + skin hyperpigmentation of cheeks bilaterally    Objective:   Physical Exam General Appearance:    Alert, cooperative, no distress, appears stated age  Head:    Normocephalic, without obvious abnormality, atraumatic  Eyes:    PERRL, conjunctiva/corneas clear, EOM's intact, fundi    benign, both eyes  Ears:    Normal TM's and external ear canals, both ears  Nose:   Nares normal, septum midline, mucosa normal, no drainage    or sinus tenderness  Throat:   Lips, mucosa, and tongue normal; teeth and gums normal  Neck:   Supple, symmetrical, trachea midline, no  adenopathy;    Thyroid: no enlargement/tenderness/nodules  Back:     Symmetric, no curvature, ROM normal, no CVA tenderness  Lungs:     Clear to auscultation bilaterally, respirations unlabored  Chest Wall:    No tenderness or deformity   Heart:    Regular rate and rhythm, S1 and S2 normal, no murmur, rub   or gallop  Breast Exam:    Deferred to mammo  Abdomen:     Soft, non-tender, bowel sounds active all four quadrants,    no masses, no organomegaly  Genitalia:    Deferred  Rectal:    Extremities:   Extremities normal, atraumatic, no cyanosis or edema  Pulses:   2+ and symmetric all extremities  Skin:   Skin color (w/ exception of mild hyperpigmentation of cheeks bilaterally from sun damage), texture, turgor normal, no rashes or lesions  Lymph nodes:   Cervical, supraclavicular, and axillary nodes normal  Neurologic:   CNII-XII intact, normal strength, sensation and reflexes    throughout          Assessment & Plan:

## 2014-10-30 ENCOUNTER — Ambulatory Visit (INDEPENDENT_AMBULATORY_CARE_PROVIDER_SITE_OTHER): Payer: Medicare HMO | Admitting: *Deleted

## 2014-10-30 DIAGNOSIS — Z23 Encounter for immunization: Secondary | ICD-10-CM

## 2014-10-30 LAB — LIPID PANEL
CHOL/HDL RATIO: 2
CHOLESTEROL: 218 mg/dL — AB (ref 0–200)
HDL: 113.8 mg/dL (ref >39.00)
LDL CALC: 94 mg/dL (ref 0–99)
NonHDL: 104.2
Triglycerides: 53 mg/dL (ref 0.0–149.0)
VLDL: 10.6 mg/dL (ref 0.0–40.0)

## 2014-10-30 LAB — CBC WITH DIFFERENTIAL/PLATELET
Basophils Absolute: 0.1 10*3/uL (ref 0.0–0.1)
Basophils Relative: 1.8 % (ref 0.0–3.0)
EOS ABS: 0 10*3/uL (ref 0.0–0.7)
EOS PCT: 1.1 % (ref 0.0–5.0)
HCT: 37.3 % (ref 36.0–46.0)
HEMOGLOBIN: 12.7 g/dL (ref 12.0–15.0)
LYMPHS ABS: 1.5 10*3/uL (ref 0.7–4.0)
Lymphocytes Relative: 33.7 % (ref 12.0–46.0)
MCHC: 34 g/dL (ref 30.0–36.0)
MCV: 105 fl — AB (ref 78.0–100.0)
MONO ABS: 0.5 10*3/uL (ref 0.1–1.0)
Monocytes Relative: 11.3 % (ref 3.0–12.0)
NEUTROS ABS: 2.3 10*3/uL (ref 1.4–7.7)
NEUTROS PCT: 52.1 % (ref 43.0–77.0)
PLATELETS: 277 10*3/uL (ref 150.0–400.0)
RBC: 3.55 Mil/uL — AB (ref 3.87–5.11)
RDW: 14.6 % (ref 11.5–15.5)
WBC: 4.5 10*3/uL (ref 4.0–10.5)

## 2014-10-30 LAB — BASIC METABOLIC PANEL
BUN: 18 mg/dL (ref 6–23)
CALCIUM: 8.9 mg/dL (ref 8.4–10.5)
CO2: 27 mEq/L (ref 19–32)
CREATININE: 1.02 mg/dL (ref 0.40–1.20)
Chloride: 105 mEq/L (ref 96–112)
GFR: 68.7 mL/min (ref >60.00)
Glucose, Bld: 78 mg/dL (ref 70–99)
Potassium: 3.8 mEq/L (ref 3.5–5.1)
Sodium: 140 mEq/L (ref 135–145)

## 2014-10-30 LAB — HEPATIC FUNCTION PANEL
ALBUMIN: 4 g/dL (ref 3.5–5.2)
ALK PHOS: 59 U/L (ref 39–117)
ALT: 11 U/L (ref 0–35)
AST: 23 U/L (ref 0–37)
BILIRUBIN TOTAL: 0.8 mg/dL (ref 0.2–1.2)
Bilirubin, Direct: 0.2 mg/dL (ref 0.0–0.3)
Total Protein: 6.6 g/dL (ref 6.0–8.3)

## 2014-10-30 LAB — TSH: TSH: 1.79 u[IU]/mL (ref 0.35–4.50)

## 2014-10-30 LAB — VITAMIN D 25 HYDROXY (VIT D DEFICIENCY, FRACTURES): VITD: 15.43 ng/mL — ABNORMAL LOW (ref 30.00–100.00)

## 2014-10-30 NOTE — Progress Notes (Signed)
Pre visit review using our clinic review tool, if applicable. No additional management support is needed unless otherwise documented below in the visit note.  Patient tolerated injection well.  

## 2014-10-31 ENCOUNTER — Other Ambulatory Visit: Payer: Self-pay | Admitting: General Practice

## 2014-10-31 MED ORDER — VITAMIN D (ERGOCALCIFEROL) 1.25 MG (50000 UNIT) PO CAPS: 50000.0000 [IU] | ORAL_CAPSULE | ORAL | Status: DC

## 2014-11-11 ENCOUNTER — Encounter: Payer: Self-pay | Admitting: Physician Assistant

## 2014-11-11 ENCOUNTER — Ambulatory Visit (INDEPENDENT_AMBULATORY_CARE_PROVIDER_SITE_OTHER): Payer: Medicare HMO | Admitting: Physician Assistant

## 2014-11-11 VITALS — BP 126/84 | HR 87 | Temp 97.7°F | Resp 16 | Ht 66.0 in | Wt 133.4 lb

## 2014-11-11 DIAGNOSIS — R221 Localized swelling, mass and lump, neck: Secondary | ICD-10-CM

## 2014-11-11 MED ORDER — PREDNISONE 20 MG PO TABS: 20.0000 mg | ORAL_TABLET | Freq: Every day | ORAL | Status: DC

## 2014-11-11 NOTE — Patient Instructions (Signed)
Please stay well hydrated. Eat soft foods. Use the prednisone as directed to help with swelling. Place a humidifier in her bedroom. Use your cough medicine as directed.  Follow-up if symptoms are not improving.

## 2014-11-11 NOTE — Assessment & Plan Note (Signed)
Chronic problem.  Following w/ Dr Joya Gaskins.  Currently asymptomatic.  Will follow and assist as able.

## 2014-11-11 NOTE — Assessment & Plan Note (Signed)
Pt's PE WNL and age appropriate.  UTD on colonoscopy, mammo, DEXA.  Written screening schedule updated and given to pt.  Prevnar given today.  Check labs.  Anticipatory guidance provided.

## 2014-11-11 NOTE — Assessment & Plan Note (Signed)
Pt w/ hx of similar.  Check labs and replete prn. 

## 2014-11-14 DIAGNOSIS — R221 Localized swelling, mass and lump, neck: Secondary | ICD-10-CM | POA: Insufficient documentation

## 2014-11-14 HISTORY — DX: Localized swelling, mass and lump, neck: R22.1

## 2014-11-14 NOTE — Assessment & Plan Note (Signed)
Mild. Supportive measures discussed. Alternate Tylenol and ibuprofen as needed for sore throat. Water gargles may be beneficial. Rx short burst of prednisone to help with uvular swelling.  Alarm signs and symptoms discussed with patient. Return if symptoms not improving.

## 2014-11-14 NOTE — Progress Notes (Signed)
Patient presents to clinic today c/o one day of sore throat and mild cough. Patient endorses she woke up with a scratchy throat and decided to gargle. States she was told by a friend that it was better to gargle with warm water and baking soda.  States she accidentally used laundry powders containing baking soda to gargle. After this she noted worsening of her sore throat and a mild dry cough. Denies shortness of breath or difficulty swallowing. Patient immediately called to laundry [place], who assured her that there were no toxins in the detergent, but encouraged her to see a medical provider.  Past Medical History  Diagnosis Date  . COPD (chronic obstructive pulmonary disease)   . Asthma   . Allergic rhinitis   . Anxiety     Current Outpatient Prescriptions on File Prior to Visit  Medication Sig Dispense Refill  . ALPRAZolam (XANAX) 0.5 MG tablet TAKE 1 TABLET BY MOUTH EVERY NIGHT AT BEDTIME AS NEEDED FOR SLEEP 30 tablet 0  . budesonide-formoterol (SYMBICORT) 160-4.5 MCG/ACT inhaler Take 2 puffs first thing in am and then another 2 puffs about 12 hours later. 1 Inhaler 12  . Calcium Carb-Cholecalciferol (CALTRATE 600+D SOFT) 600-800 MG-UNIT CHEW Chew 1 tablet by mouth 2 (two) times daily.    Marland Kitchen ibuprofen (ADVIL,MOTRIN) 800 MG tablet TAKE 1 TABLET BY MOUTH EVERY 8 HOURS AS NEEDED 60 tablet 0  . levalbuterol (XOPENEX HFA) 45 MCG/ACT inhaler Inhale 2 puffs into the lungs every 6 (six) hours as needed for wheezing. 1 Inhaler 12  . levalbuterol (XOPENEX) 1.25 MG/3ML nebulizer solution Take 1.25 mg by nebulization every 8 (eight) hours as needed for wheezing. 72 mL 12  . Multiple Vitamin (MULTIVITAMIN) tablet Take 1 tablet by mouth every morning.    . tiotropium (SPIRIVA) 18 MCG inhalation capsule Place 1 capsule (18 mcg total) into inhaler and inhale daily. 30 capsule 5  . Vitamin D, Ergocalciferol, (DRISDOL) 50000 UNITS CAPS capsule Take 1 capsule (50,000 Units total) by mouth every  7 (seven) days. 4 capsule 3  . guaiFENesin-codeine (ROBITUSSIN AC) 100-10 MG/5ML syrup Take 10 mLs by mouth 3 (three) times daily as needed for cough. (Patient not taking: Reported on 11/11/2014) 240 mL 0  . Respiratory Therapy Supplies (FLUTTER) DEVI Use 3-4 times per day (Patient not taking: Reported on 11/11/2014) 1 each 0   No current facility-administered medications on file prior to visit.    Allergies  Allergen Reactions  . Avelox [Moxifloxacin Hcl In Nacl] Itching and Rash    Family History  Problem Relation Age of Onset  . Coronary artery disease Mother   . Heart disease Mother     CHF  . Diabetes Neg Hx   . Cancer Neg Hx   . Stroke Neg Hx   . Colon cancer Neg Hx     History   Social History  . Marital Status: Widowed    Spouse Name: N/A  . Number of Children: 1  . Years of Education: N/A   Occupational History  . 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 Smoking status: Former Smoker -- 1.00 packs/day for 35 years    Types: Cigarettes    Quit date: 08/10/2012  . Smokeless tobacco: Never Used     Comment: Started at age 53  . Alcohol Use: Yes     Comment: socially  . Drug Use: No  . Sexual Activity: Yes    Birth Control/  Protection: Post-menopausal   Other Topics Concern  . None   Social History Narrative   Lost husband in 12-2011, she lost her sister in 2012, brother in July 2013. She lives by herself, God son stayed with her. She retired as Radio producer.    Review of Systems - See HPI.  All other ROS are negative.  BP 126/84 mmHg  Pulse 87  Temp(Src) 97.7 F (36.5 C) (Oral)  Resp 16  Ht 5\' 6"  (1.676 m)  Wt 133 lb 6 oz (60.499 kg)  BMI 21.54 kg/m2  SpO2 99%  Physical Exam  Constitutional: She is oriented to person, place, and time and well-developed, well-nourished, and in no distress.  HENT:  Head: Normocephalic and atraumatic.  Right Ear: External ear normal.  Left Ear: External ear normal.    Mouth/Throat: Mucous membranes are normal. Uvula swelling present. Posterior oropharyngeal erythema present. No oropharyngeal exudate, posterior oropharyngeal edema or tonsillar abscesses.  Cardiovascular: Normal rate, regular rhythm, normal heart sounds and intact distal pulses.   Pulmonary/Chest: Effort normal and breath sounds normal. No respiratory distress. She has no wheezes. She has no rales. She exhibits no tenderness.  Neurological: She is alert and oriented to person, place, and time.  Skin: Skin is warm and dry. No rash noted.  Psychiatric: Affect normal.  Vitals reviewed.   Recent Results (from the past 2160 hour(s))  Lipid panel     Status: Abnormal   Collection Time: 10/29/14  2:15 PM  Result Value Ref Range   Cholesterol 218 (H) 0 - 200 mg/dL    Comment: ATP III Classification       Desirable:  < 200 mg/dL               Borderline High:  200 - 239 mg/dL          High:  > = 240 mg/dL   Triglycerides 53.0 0.0 - 149.0 mg/dL    Comment: Normal:  <150 mg/dLBorderline High:  150 - 199 mg/dL   HDL 113.80 >39.00 mg/dL   VLDL 10.6 0.0 - 40.0 mg/dL   LDL Cholesterol 94 0 - 99 mg/dL   Total CHOL/HDL Ratio 2     Comment:                Men          Women1/2 Average Risk     3.4          3.3Average Risk          5.0          4.42X Average Risk          9.6          7.13X Average Risk          15.0          11.0                       NonHDL 104.20     Comment: NOTE:  Non-HDL goal should be 30 mg/dL higher than patient's LDL goal (i.e. LDL goal of < 70 mg/dL, would have non-HDL goal of < 100 mg/dL)  Basic metabolic panel     Status: None   Collection Time: 10/29/14  2:15 PM  Result Value Ref Range   Sodium 140 135 - 145 mEq/L   Potassium 3.8 3.5 - 5.1 mEq/L   Chloride 105 96 - 112 mEq/L   CO2 27 19 - 32 mEq/L   Glucose,  Bld 78 70 - 99 mg/dL   BUN 18 6 - 23 mg/dL   Creatinine, Ser 1.02 0.40 - 1.20 mg/dL   Calcium 8.9 8.4 - 10.5 mg/dL   GFR 68.70 >60.00 mL/min  Hepatic function  panel     Status: None   Collection Time: 10/29/14  2:15 PM  Result Value Ref Range   Total Bilirubin 0.8 0.2 - 1.2 mg/dL   Bilirubin, Direct 0.2 0.0 - 0.3 mg/dL   Alkaline Phosphatase 59 39 - 117 U/L   AST 23 0 - 37 U/L   ALT 11 0 - 35 U/L   Total Protein 6.6 6.0 - 8.3 g/dL   Albumin 4.0 3.5 - 5.2 g/dL  TSH     Status: None   Collection Time: 10/29/14  2:15 PM  Result Value Ref Range   TSH 1.79 0.35 - 4.50 uIU/mL  CBC with Differential/Platelet     Status: Abnormal   Collection Time: 10/29/14  2:15 PM  Result Value Ref Range   WBC 4.5 4.0 - 10.5 K/uL   RBC 3.55 (L) 3.87 - 5.11 Mil/uL   Hemoglobin 12.7 12.0 - 15.0 g/dL   HCT 37.3 36.0 - 46.0 %   MCV 105.0 (H) 78.0 - 100.0 fl   MCHC 34.0 30.0 - 36.0 g/dL   RDW 14.6 11.5 - 15.5 %   Platelets 277.0 150.0 - 400.0 K/uL   Neutrophils Relative % 52.1 43.0 - 77.0 %   Lymphocytes Relative 33.7 12.0 - 46.0 %   Monocytes Relative 11.3 3.0 - 12.0 %   Eosinophils Relative 1.1 0.0 - 5.0 %   Basophils Relative 1.8 0.0 - 3.0 %   Neutro Abs 2.3 1.4 - 7.7 K/uL   Lymphs Abs 1.5 0.7 - 4.0 K/uL   Monocytes Absolute 0.5 0.1 - 1.0 K/uL   Eosinophils Absolute 0.0 0.0 - 0.7 K/uL   Basophils Absolute 0.1 0.0 - 0.1 K/uL  Vit D  25 hydroxy (rtn osteoporosis monitoring)     Status: Abnormal   Collection Time: 10/29/14  2:15 PM  Result Value Ref Range   VITD 15.43 (L) 30.00 - 100.00 ng/mL    Assessment/Plan: Swollen uvula Mild. Supportive measures discussed. Alternate Tylenol and ibuprofen as needed for sore throat. Water gargles may be beneficial. Rx short burst of prednisone to help with uvular swelling.  Alarm signs and symptoms discussed with patient. Return if symptoms not improving.

## 2014-11-29 ENCOUNTER — Telehealth: Payer: Self-pay | Admitting: Family Medicine

## 2014-11-30 ENCOUNTER — Other Ambulatory Visit: Payer: Self-pay | Admitting: Family Medicine

## 2014-12-01 NOTE — Telephone Encounter (Signed)
Last OV 10-29-14 Alprazolam last filled 07-30-14 #30 with 0

## 2014-12-01 NOTE — Telephone Encounter (Signed)
Already refilled today

## 2014-12-01 NOTE — Telephone Encounter (Signed)
Refill x1   1 refill 

## 2014-12-01 NOTE — Telephone Encounter (Signed)
Xanax Refill Last seen 10/29/14 and filled 07/30/14 #30 UDS 08/28/13 low risk   Please advise      KP

## 2014-12-01 NOTE — Telephone Encounter (Signed)
Med filled and faxed.  

## 2014-12-04 ENCOUNTER — Telehealth: Payer: Self-pay | Admitting: Family Medicine

## 2014-12-04 ENCOUNTER — Other Ambulatory Visit: Payer: Self-pay | Admitting: Family Medicine

## 2014-12-04 NOTE — Telephone Encounter (Signed)
This medication was filled on 12/01/14. Phoned in refill request again today.

## 2014-12-04 NOTE — Telephone Encounter (Signed)
Caller name: Leighanne, Adolph Relation to pt: self  Call back number: [phone] Pharmacy: [id number] - [place], [place] - [address] AT [place] [phone] (Phone) [phone] (Fax)         Reason for call: As per pt pharmacy has not received  ALPRAZolam (XANAX) 0.5 MG tablet

## 2014-12-04 NOTE — Telephone Encounter (Signed)
Med phoned in °

## 2014-12-24 ENCOUNTER — Ambulatory Visit (INDEPENDENT_AMBULATORY_CARE_PROVIDER_SITE_OTHER): Payer: Medicare HMO | Admitting: Critical Care Medicine

## 2014-12-24 ENCOUNTER — Encounter: Payer: Self-pay | Admitting: Critical Care Medicine

## 2014-12-24 VITALS — BP 130/62 | HR 51 | Temp 97.5°F | Ht 66.0 in | Wt 132.0 lb

## 2014-12-24 DIAGNOSIS — J438 Other emphysema: Secondary | ICD-10-CM | POA: Diagnosis not present

## 2014-12-24 DIAGNOSIS — J441 Chronic obstructive pulmonary disease with (acute) exacerbation: Secondary | ICD-10-CM | POA: Diagnosis not present

## 2014-12-24 MED ORDER — BUDESONIDE-FORMOTEROL FUMARATE 160-4.5 MCG/ACT IN AERO: INHALATION_SPRAY | RESPIRATORY_TRACT | Status: DC

## 2014-12-24 MED ORDER — ALBUTEROL SULFATE HFA 108 (90 BASE) MCG/ACT IN AERS: 2.0000 | INHALATION_SPRAY | Freq: Four times a day (QID) | RESPIRATORY_TRACT | Status: DC | PRN

## 2014-12-24 MED ORDER — TIOTROPIUM BROMIDE MONOHYDRATE 18 MCG IN CAPS: 18.0000 ug | ORAL_CAPSULE | Freq: Every day | RESPIRATORY_TRACT | Status: DC

## 2014-12-24 NOTE — Assessment & Plan Note (Signed)
Recent COPD exacerbation now resolved Continue maintenance program with Spiriva Symbicort

## 2014-12-24 NOTE — Progress Notes (Signed)
Subjective:    Patient ID: Jenna Marquez, female    DOB: [birth year], 69 y.o.   MRN: [id number]  HPI 12/24/2014 Chief Complaint  Patient presents with  . Follow-up    Pt c/o prod cough with a small amount thick white mucus at bedtime. SOB and dizziness with mild excertion. Denies any wheezing and chest tightness/congestion. Would like to talk about xopenex refill, insurance not wanting to refill.   notes small amount of mucus, not as often as before. Dyspnea with exertion and is mild, and if bend over or lift up high.  Never been to pulm rehab. If walks a lot will stop to rest. On no oxygen. If get out seat too fast is an issue.    Pt denies any significant sore throat, nasal congestion or excess secretions, fever, chills, sweats, unintended weight loss, pleurtic or exertional chest pain, orthopnea PND, or leg swelling Pt denies any increase in rescue therapy over baseline, denies waking up needing it or having any early am or nocturnal exacerbations of coughing/wheezing/or dyspnea. Pt also denies any obvious fluctuation in symptoms with  weather or environmental change or other alleviating or aggravating factors   Current Medications, Allergies, Complete Past Medical History, Past Surgical History, Family History, and Social History were reviewed in [employer] record.  Past Medical History  Diagnosis Date  . COPD (chronic obstructive pulmonary disease)   . Asthma   . Allergic rhinitis   . Anxiety      Family History  Problem Relation Age of Onset  . Coronary artery disease Mother   . Heart disease Mother     CHF  . Diabetes Neg Hx   . Cancer Neg Hx   . Stroke Neg Hx   . Colon cancer Neg Hx      History   Social History  . Marital Status: Widowed    Spouse Name: N/A  . Number of Children: 1  . Years of Education: N/A   Occupational History  . 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 Smoking status: Former  Smoker -- 1.00 packs/day for 35 years    Types: Cigarettes    Quit date: 08/10/2012  . Smokeless tobacco: Never Used     Comment: Started at age 20  . Alcohol Use: Yes     Comment: socially  . Drug Use: No  . Sexual Activity: Yes    Birth Control/ Protection: Post-menopausal   Other Topics Concern  . Not on file   Social History Narrative   Lost husband in 12-2011, she lost her sister in 2012, brother in July 2013. She lives by herself, God son stayed with her. She retired as Radio producer.     Allergies  Allergen Reactions  . Avelox [Moxifloxacin Hcl In Nacl] Itching and Rash     Outpatient Prescriptions Prior to Visit  Medication Sig Dispense Refill  . ALPRAZolam (XANAX) 0.5 MG tablet TAKE 1 TABLET BY MOUTH EVERY NIGHT AT BEDTIME AS NEEDED FOR SLEEP 30 tablet 0  . Calcium Carb-Cholecalciferol (CALTRATE 600+D SOFT) 600-800 MG-UNIT CHEW Chew 1 tablet by mouth 2 (two) times daily.    Marland Kitchen guaiFENesin-codeine (ROBITUSSIN AC) 100-10 MG/5ML syrup Take 10 mLs by mouth 3 (three) times daily as needed for cough. 240 mL 0  . ibuprofen (ADVIL,MOTRIN) 800 MG tablet TAKE 1 TABLET BY MOUTH EVERY 8 HOURS AS NEEDED 60 tablet 0  . Multiple Vitamin (MULTIVITAMIN) tablet Take 1 tablet  by mouth every morning.    Marland Kitchen Respiratory Therapy Supplies (FLUTTER) DEVI Use 3-4 times per day 1 each 0  . Vitamin D, Ergocalciferol, (DRISDOL) 50000 UNITS CAPS capsule Take 1 capsule (50,000 Units total) by mouth every 7 (seven) days. 4 capsule 3  . budesonide-formoterol (SYMBICORT) 160-4.5 MCG/ACT inhaler Take 2 puffs first thing in am and then another 2 puffs about 12 hours later. 1 Inhaler 12  . levalbuterol (XOPENEX HFA) 45 MCG/ACT inhaler Inhale 2 puffs into the lungs every 6 (six) hours as needed for wheezing. 1 Inhaler 12  . tiotropium (SPIRIVA) 18 MCG inhalation capsule Place 1 capsule (18 mcg total) into inhaler and inhale daily. 30 capsule 5  . levalbuterol (XOPENEX) 1.25 MG/3ML nebulizer solution Take 1.25  mg by nebulization every 8 (eight) hours as needed for wheezing. (Patient not taking: Reported on 12/24/2014) 72 mL 12  . predniSONE (DELTASONE) 20 MG tablet Take 1 tablet (20 mg total) by mouth daily with breakfast. (Patient not taking: Reported on 12/24/2014) 3 tablet 0  . loratadine (CLARITIN) 10 MG tablet Take 10 mg by mouth daily as needed for allergies.     No facility-administered medications prior to visit.         Review of Systems  Constitutional: Positive for fatigue. Negative for fever, chills, diaphoresis, activity change, appetite change and unexpected weight change.  HENT: Positive for postnasal drip and sore throat. Negative for congestion, dental problem, ear discharge, ear pain, facial swelling, hearing loss, mouth sores, nosebleeds, rhinorrhea, sinus pressure, sneezing, tinnitus, trouble swallowing and voice change.   Eyes: Negative for photophobia, discharge, itching and visual disturbance.  Respiratory: Positive for cough, chest tightness and shortness of breath. Negative for apnea, choking, wheezing and stridor.   Cardiovascular: Negative for chest pain, palpitations and leg swelling.  Gastrointestinal: Negative for nausea, vomiting, abdominal pain, constipation, blood in stool and abdominal distention.  Genitourinary: Negative for dysuria, urgency, frequency, hematuria, flank pain, decreased urine volume and difficulty urinating.  Musculoskeletal: Negative for myalgias, back pain, joint swelling, arthralgias, gait problem, neck pain and neck stiffness.  Skin: Negative for color change, pallor and rash.  Neurological: Negative for dizziness, tremors, seizures, syncope, speech difficulty, weakness, light-headedness, numbness and headaches.  Hematological: Negative for adenopathy. Does not bruise/bleed easily.  Psychiatric/Behavioral: Negative for confusion, sleep disturbance and agitation. The patient is not nervous/anxious.        Objective:   Physical Exam    Constitutional: Vital signs are normal. She appears well-developed and well-nourished. She is active.  Non-toxic appearance. She does not appear ill. No distress.  HENT:  Head: Normocephalic and atraumatic.  Nose: No mucosal edema, rhinorrhea, sinus tenderness, nasal deformity or septal deviation. No epistaxis. Right sinus exhibits no maxillary sinus tenderness and no frontal sinus tenderness. Left sinus exhibits no maxillary sinus tenderness and no frontal sinus tenderness.  Mouth/Throat: Oropharynx is clear and moist. No oropharyngeal exudate.  Eyes: Conjunctivae and EOM are normal. Pupils are equal, round, and reactive to light. Right eye exhibits no discharge. Left eye exhibits no discharge. No scleral icterus.  Neck: Normal range of motion. Neck supple. Normal carotid pulses, no hepatojugular reflux and no JVD present. No tracheal tenderness and no muscular tenderness present. Carotid bruit is not present. No rigidity. No tracheal deviation, no edema, no erythema and normal range of motion present. No thyroid mass and no thyromegaly present.  Cardiovascular: Normal rate, regular rhythm, S1 normal, S2 normal, normal heart sounds, intact distal pulses and normal pulses.  PMI is  not displaced.  Exam reveals no gallop, no S3, no S4, no distant heart sounds and no friction rub.   No murmur heard.  No systolic murmur is present   No diastolic murmur is present  Pulmonary/Chest: Effort normal. No accessory muscle usage or stridor. No apnea, no tachypnea and no bradypnea. No respiratory distress. She has decreased breath sounds. She has no wheezes. She has no rhonchi. She has no rales. Chest wall is not dull to percussion. She exhibits no mass, no tenderness, no bony tenderness and no deformity.  Abdominal: Soft. Normal appearance and bowel sounds are normal. She exhibits no distension, no ascites and no mass. There is no hepatosplenomegaly. There is no tenderness. There is no rigidity, no rebound, no  guarding and no CVA tenderness.  Musculoskeletal: Normal range of motion.  Lymphadenopathy:       Head (right side): No submental and no submandibular adenopathy present.       Head (left side): No submental and no submandibular adenopathy present.    She has no cervical adenopathy.    She has no axillary adenopathy.  Neurological: She is alert. She has normal strength and normal reflexes. No cranial nerve deficit or sensory deficit.  Skin: Skin is warm and dry. No bruising, no ecchymosis, no lesion and no rash noted. She is not diaphoretic. No cyanosis or erythema. No pallor. Nails show no clubbing.  Psychiatric: She has a normal mood and affect. Her speech is normal and behavior is normal.  Vitals reviewed.         Assessment & Plan:  I personally reviewed all images and lab data in the [facility] system as well as any outside material available during this office visit and agree with the  radiology impressions.  I also have reviewed any data /notes/records if available in care everywhere.  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 Gold C COPD without respiratory failure  No smoking since 2014 Chronic dyspnea with exertion with minimal mucus production Plan Maintain Symbicort and Spiriva daily We'll switch to Xopenex to as needed proair Return 4 months   COPD exacerbation Recent COPD exacerbation now resolved Continue maintenance program with Spiriva Symbicort     Ranelle was seen today for follow-up.  Diagnoses and all orders for this visit:  Other emphysema  COPD exacerbation  Other orders -     albuterol (PROAIR HFA) 108 (90 BASE) MCG/ACT inhaler; Inhale 2 puffs into the lungs every 6 (six) hours as needed for wheezing or shortness of breath. -     tiotropium (SPIRIVA) 18 MCG inhalation capsule; Place 1 capsule (18 mcg total) into inhaler and inhale daily. -     budesonide-formoterol (SYMBICORT) 160-4.5 MCG/ACT inhaler; Take 2 puffs first thing in am  and then another 2 puffs about 12 hours later.

## 2014-12-24 NOTE — Patient Instructions (Signed)
We will switching Xopenex HFA to Proair HFA.   Spiriva and Symbicort Refills sent  No other medicine changes We recommend you receive TDap Vaccination Follow up with Dr. Joya Gaskins in 4 months

## 2014-12-24 NOTE — Assessment & Plan Note (Signed)
Gold C COPD without respiratory failure  No smoking since 2014 Chronic dyspnea with exertion with minimal mucus production Plan Maintain Symbicort and Spiriva daily We'll switch to Xopenex to as needed proair Return 4 months

## 2015-01-06 ENCOUNTER — Inpatient Hospital Stay (HOSPITAL_BASED_OUTPATIENT_CLINIC_OR_DEPARTMENT_OTHER)
Admission: EM | Admit: 2015-01-06 | Discharge: 2015-01-08 | DRG: 244 | Disposition: A | Discharge status: Home / Self Care | Payer: Medicare HMO | Attending: Internal Medicine | Admitting: Internal Medicine

## 2015-01-06 ENCOUNTER — Telehealth: Payer: Self-pay | Admitting: Family Medicine

## 2015-01-06 ENCOUNTER — Emergency Department (HOSPITAL_BASED_OUTPATIENT_CLINIC_OR_DEPARTMENT_OTHER): Payer: Medicare HMO

## 2015-01-06 ENCOUNTER — Telehealth: Payer: Self-pay | Admitting: Critical Care Medicine

## 2015-01-06 ENCOUNTER — Encounter (HOSPITAL_BASED_OUTPATIENT_CLINIC_OR_DEPARTMENT_OTHER): Payer: Self-pay | Admitting: *Deleted

## 2015-01-06 DIAGNOSIS — Z79899 Other long term (current) drug therapy: Secondary | ICD-10-CM

## 2015-01-06 DIAGNOSIS — R0602 Shortness of breath: Secondary | ICD-10-CM

## 2015-01-06 DIAGNOSIS — I442 Atrioventricular block, complete: Principal | ICD-10-CM | POA: Diagnosis present

## 2015-01-06 DIAGNOSIS — I4430 Unspecified atrioventricular block: Secondary | ICD-10-CM

## 2015-01-06 DIAGNOSIS — Z7952 Long term (current) use of systemic steroids: Secondary | ICD-10-CM

## 2015-01-06 DIAGNOSIS — R079 Chest pain, unspecified: Secondary | ICD-10-CM | POA: Diagnosis not present

## 2015-01-06 DIAGNOSIS — Z8249 Family history of ischemic heart disease and other diseases of the circulatory system: Secondary | ICD-10-CM

## 2015-01-06 DIAGNOSIS — Z7951 Long term (current) use of inhaled steroids: Secondary | ICD-10-CM | POA: Diagnosis not present

## 2015-01-06 DIAGNOSIS — Z87891 Personal history of nicotine dependence: Secondary | ICD-10-CM

## 2015-01-06 DIAGNOSIS — F411 Generalized anxiety disorder: Secondary | ICD-10-CM | POA: Diagnosis present

## 2015-01-06 DIAGNOSIS — J45909 Unspecified asthma, uncomplicated: Secondary | ICD-10-CM | POA: Diagnosis present

## 2015-01-06 DIAGNOSIS — J449 Chronic obstructive pulmonary disease, unspecified: Secondary | ICD-10-CM | POA: Diagnosis present

## 2015-01-06 DIAGNOSIS — R001 Bradycardia, unspecified: Secondary | ICD-10-CM | POA: Diagnosis present

## 2015-01-06 DIAGNOSIS — I4439 Other atrioventricular block: Secondary | ICD-10-CM | POA: Diagnosis present

## 2015-01-06 DIAGNOSIS — E559 Vitamin D deficiency, unspecified: Secondary | ICD-10-CM | POA: Diagnosis present

## 2015-01-06 DIAGNOSIS — R55 Syncope and collapse: Secondary | ICD-10-CM

## 2015-01-06 DIAGNOSIS — K219 Gastro-esophageal reflux disease without esophagitis: Secondary | ICD-10-CM | POA: Diagnosis present

## 2015-01-06 DIAGNOSIS — IMO0002 Reserved for concepts with insufficient information to code with codable children: Secondary | ICD-10-CM

## 2015-01-06 HISTORY — DX: Unspecified atrioventricular block: I44.30

## 2015-01-06 HISTORY — DX: Gastro-esophageal reflux disease without esophagitis: K21.9

## 2015-01-06 HISTORY — DX: Other atrioventricular block: I44.39

## 2015-01-06 LAB — CBC WITH DIFFERENTIAL/PLATELET
BASOS ABS: 0 10*3/uL (ref 0.0–0.1)
BASOS PCT: 1 % (ref 0–1)
EOS PCT: 0 % (ref 0–5)
Eosinophils Absolute: 0 10*3/uL (ref 0.0–0.7)
HCT: 35.9 % — ABNORMAL LOW (ref 36.0–46.0)
Hemoglobin: 12.7 g/dL (ref 12.0–15.0)
LYMPHS ABS: 1.5 10*3/uL (ref 0.7–4.0)
LYMPHS PCT: 24 % (ref 12–46)
MCH: 36.1 pg — AB (ref 26.0–34.0)
MCHC: 35.4 g/dL (ref 30.0–36.0)
MCV: 102 fL — AB (ref 78.0–100.0)
Monocytes Absolute: 0.7 10*3/uL (ref 0.1–1.0)
Monocytes Relative: 11 % (ref 3–12)
Neutro Abs: 4 10*3/uL (ref 1.7–7.7)
Neutrophils Relative %: 64 % (ref 43–77)
Platelets: 262 10*3/uL (ref 150–400)
RBC: 3.52 MIL/uL — ABNORMAL LOW (ref 3.87–5.11)
RDW: 12.1 % (ref 11.5–15.5)
WBC: 6.2 10*3/uL (ref 4.0–10.5)

## 2015-01-06 LAB — COMPREHENSIVE METABOLIC PANEL
ALT: 21 U/L (ref 14–54)
AST: 37 U/L (ref 15–41)
Albumin: 3.9 g/dL (ref 3.5–5.0)
Alkaline Phosphatase: 95 U/L (ref 38–126)
Anion gap: 19 — ABNORMAL HIGH (ref 5–15)
BUN: 24 mg/dL — ABNORMAL HIGH (ref 6–20)
CHLORIDE: 97 mmol/L — AB (ref 101–111)
CO2: 18 mmol/L — ABNORMAL LOW (ref 22–32)
Calcium: 8.9 mg/dL (ref 8.9–10.3)
Creatinine, Ser: 1.14 mg/dL — ABNORMAL HIGH (ref 0.44–1.00)
GFR calc non Af Amer: 47 mL/min — ABNORMAL LOW (ref >60)
GFR, EST AFRICAN AMERICAN: 55 mL/min — AB (ref >60)
Glucose, Bld: 86 mg/dL (ref 65–99)
Potassium: 4.2 mmol/L (ref 3.5–5.1)
SODIUM: 134 mmol/L — AB (ref 135–145)
TOTAL PROTEIN: 7.3 g/dL (ref 6.5–8.1)
Total Bilirubin: 0.7 mg/dL (ref 0.3–1.2)

## 2015-01-06 LAB — TROPONIN I
Troponin I: <0.03 ng/mL (ref <0.031)
Troponin I: <0.03 ng/mL (ref <0.031)

## 2015-01-06 MED ORDER — PANTOPRAZOLE SODIUM 40 MG IV SOLR
40.0000 mg | Freq: Once | INTRAVENOUS | Status: AC
  Administered 2015-01-06: 40 mg via INTRAVENOUS
  Filled 2015-01-06: qty 40

## 2015-01-06 MED ORDER — SODIUM CHLORIDE 0.9 % IV SOLN: INTRAVENOUS | Status: AC

## 2015-01-06 MED ORDER — ACETAMINOPHEN 325 MG PO TABS
650.0000 mg | ORAL_TABLET | ORAL | Status: DC | PRN
  Administered 2015-01-07: 650 mg via ORAL
  Filled 2015-01-06: qty 2

## 2015-01-06 MED ORDER — SODIUM CHLORIDE 0.9 % IV SOLN: INTRAVENOUS | Status: DC

## 2015-01-06 MED ORDER — ONDANSETRON HCL 4 MG/2ML IJ SOLN: 4.0000 mg | Freq: Four times a day (QID) | INTRAMUSCULAR | Status: DC | PRN

## 2015-01-06 MED ORDER — HEPARIN SODIUM (PORCINE) 5000 UNIT/ML IJ SOLN
5000.0000 [IU] | Freq: Three times a day (TID) | INTRAMUSCULAR | Status: DC
  Filled 2015-01-06: qty 1

## 2015-01-06 MED ORDER — ALUM & MAG HYDROXIDE-SIMETH 200-200-20 MG/5ML PO SUSP
30.0000 mL | ORAL | Status: DC | PRN
  Administered 2015-01-07: 30 mL via ORAL
  Filled 2015-01-06: qty 30

## 2015-01-06 MED ORDER — OFF THE BEAT BOOK
Freq: Once | Status: AC
  Administered 2015-01-07
  Filled 2015-01-06: qty 1

## 2015-01-06 MED ORDER — SODIUM CHLORIDE 0.9 % IV SOLN
INTRAVENOUS | Status: AC
  Administered 2015-01-06: 19:00:00 via INTRAVENOUS

## 2015-01-06 MED ORDER — NITROGLYCERIN 0.4 MG SL SUBL: 0.4000 mg | SUBLINGUAL_TABLET | SUBLINGUAL | Status: DC | PRN

## 2015-01-06 NOTE — ED Notes (Signed)
zoll pads placed on patient and monitored by ZOll per MD

## 2015-01-06 NOTE — Telephone Encounter (Signed)
[facility] [place] Day - Client TELEPHONE ADVICE RECORD   [facility]     Patient Name: Jenna Marquez Initial Comment Caller states the PT is having dizziness and is SOB. Started this morning.   DOB: Mar 07, 1946      Nurse Assessment  Nurse: Malva Cogan, RN, Juliann Pulse Date/Time (Eastern Time): 01/06/2015 3:46:51 PM  Confirm and document reason for call. If symptomatic, describe symptoms. ---Caller states that she has COPD/asthma, had onset of shortness of breath with exertion this AM, has been using her Xopenex inhaler but has not been using every 4 hrs. Last used her inhaler approx 1 hr ago but still feels short of breath.  Has the patient traveled out of the country within the last 30 days? ---No  Does the patient require triage? ---Yes  Related visit to physician within the last 2 weeks? ---No  Does the PT have any chronic conditions? (i.e. diabetes, asthma, etc.) ---Yes  List chronic conditions. ---COPD, asthma, allergies    Guidelines     Guideline Title Affirmed Question Affirmed Notes   Asthma Attack Asthma medicine (nebulizer or inhaler) is needed more frequently than q 4 hours to keep you comfortable    Final Disposition User   Go to ED Now (or PCP triage) Malva Cogan, RN, Juliann Pulse   Comments   Caller is able to speak in complete sentences but keeps saying that she is short of breath with exertion & feels like she needs her inhaler more often than every 4 hrs. Triager feels that she needs to be seen sooner than what is available at office so triager advised to go to ED. States that she doesn't have any transportation to ED unless she drives herself, advised to call 911 then for transportation & caller became upset & started crying that she couldn't afford to pay for an ambulance.

## 2015-01-06 NOTE — ED Provider Notes (Signed)
CSN: [id number]     Arrival date & time 01/06/15  1715 History   First MD Initiated Contact with Patient 01/06/15 1725     Chief Complaint  Patient presents with  . Shortness of Breath     (Consider location/radiation/quality/duration/timing/severity/associated sxs/prior Treatment) Patient is a 69 y.o. female presenting with shortness of breath. The history is provided by the patient.  Shortness of Breath Associated symptoms: chest pain   Associated symptoms: no abdominal pain, no fever and no rash    the patient with complaint of shortness of breath and lightheadedness today. 2 days ago had some left-sided chest pain that she thought was indigestion. Patient has a history of COPD followed by pulmonary medicine. Patient does not feel like she's wheezing. Patient has no known coronary artery disease problem other than a history of a heart murmur. Patient denies any chest pain today.  Past Medical History  Diagnosis Date  . COPD (chronic obstructive pulmonary disease)   . Asthma   . Allergic rhinitis   . Anxiety    Past Surgical History  Procedure Laterality Date  . Ectopic pregnancy surgery  1974  . Tonsillectomy and adenoidectomy  1954   Family History  Problem Relation Age of Onset  . Coronary artery disease Mother   . Heart disease Mother     CHF  . Diabetes Neg Hx   . Cancer Neg Hx   . Stroke Neg Hx   . Colon cancer Neg Hx    History  Substance Use Topics  . Smoking status: Former Smoker -- 1.00 packs/day for 35 years    Types: Cigarettes    Quit date: 08/10/2012  . Smokeless tobacco: Never Used     Comment: Started at age 32  . Alcohol Use: Yes     Comment: socially   OB History    No data available     Review of Systems  Constitutional: Negative for fever.  HENT: Negative for congestion.   Respiratory: Positive for shortness of breath.   Cardiovascular: Positive for chest pain.  Gastrointestinal: Negative for abdominal pain.  Genitourinary: Negative for  dysuria.  Musculoskeletal: Negative for back pain.  Skin: Negative for rash.  Neurological: Positive for dizziness and light-headedness. Negative for syncope.  Hematological: Does not bruise/bleed easily.  Psychiatric/Behavioral: Negative for confusion.      Allergies  Avelox  Home Medications   Prior to Admission medications   Medication Sig Start Date End Date Taking? Authorizing Provider  albuterol (PROAIR HFA) 108 (90 BASE) MCG/ACT inhaler Inhale 2 puffs into the lungs every 6 (six) hours as needed for wheezing or shortness of breath. 12/24/14   Elsie Stain, MD  ALPRAZolam Duanne Moron) 0.5 MG tablet TAKE 1 TABLET BY MOUTH EVERY NIGHT AT BEDTIME AS NEEDED FOR SLEEP 12/04/14   Midge Minium, MD  budesonide-formoterol [facility]) 160-4.5 MCG/ACT inhaler Take 2 puffs first thing in am and then another 2 puffs about 12 hours later. 12/24/14   Elsie Stain, MD  Calcium Carb-Cholecalciferol (CALTRATE 600+D SOFT) 600-800 MG-UNIT CHEW Chew 1 tablet by mouth 2 (two) times daily.    Historical Provider, MD  cetirizine (ZYRTEC) 10 MG tablet Take 10 mg by mouth daily.    Historical Provider, MD  guaiFENesin-codeine (ROBITUSSIN AC) 100-10 MG/5ML syrup Take 10 mLs by mouth 3 (three) times daily as needed for cough. 08/28/14   Elsie Stain, MD  ibuprofen (ADVIL,MOTRIN) 800 MG tablet TAKE 1 TABLET BY MOUTH EVERY 8 HOURS AS NEEDED  Midge Minium, MD  levalbuterol Penne Lash) 1.25 MG/3ML nebulizer solution Take 1.25 mg by nebulization every 8 (eight) hours as needed for wheezing. Patient not taking: Reported on 12/24/2014 01/29/14   Midge Minium, MD  Multiple Vitamin (MULTIVITAMIN) tablet Take 1 tablet by mouth every morning.    Historical Provider, MD  predniSONE (DELTASONE) 20 MG tablet Take 1 tablet (20 mg total) by mouth daily with breakfast. Patient not taking: Reported on 12/24/2014 11/11/14   Brunetta Jeans, PA-C  Respiratory Therapy Supplies (FLUTTER) DEVI Use 3-4 times per  day 08/28/14   Elsie Stain, MD  tiotropium (SPIRIVA) 18 MCG inhalation capsule Place 1 capsule (18 mcg total) into inhaler and inhale daily. 12/24/14   Elsie Stain, MD  Vitamin D, Ergocalciferol, (DRISDOL) 50000 UNITS CAPS capsule Take 1 capsule (50,000 Units total) by mouth every 7 (seven) days. 10/31/14   Midge Minium, MD   BP 164/36 mmHg  Pulse 42  Temp(Src) 98.2 F (36.8 C) (Oral)  Resp 20  Ht 5\' 6"  (1.676 m)  Wt 132 lb (59.875 kg)  BMI 21.32 kg/m2  SpO2 98% Physical Exam  Constitutional: She is oriented to person, place, and time. She appears well-developed and well-nourished. No distress.  HENT:  Head: Normocephalic and atraumatic.  Mouth/Throat: Oropharynx is clear and moist.  Eyes: Conjunctivae and EOM are normal. Pupils are equal, round, and reactive to light.  Neck: Normal range of motion.  Cardiovascular: Regular rhythm and normal heart sounds.   No murmur heard. Significant bradycardia  Pulmonary/Chest: Effort normal and breath sounds normal. No respiratory distress. She has no wheezes.  Abdominal: Bowel sounds are normal. There is no tenderness.  Musculoskeletal: Normal range of motion.  Neurological: She is alert and oriented to person, place, and time. No cranial nerve deficit. She exhibits normal muscle tone.  Skin: Skin is warm. No rash noted.  Nursing note and vitals reviewed.   ED Course  Procedures (including critical care time) Labs Review Labs Reviewed  CBC WITH DIFFERENTIAL/PLATELET - Abnormal; Notable for the following:    RBC 3.52 (*)    HCT 35.9 (*)    MCV 102.0 (*)    MCH 36.1 (*)    All other components within normal limits  TROPONIN I  COMPREHENSIVE METABOLIC PANEL    Imaging Review No results found.   EKG Interpretation   Date/Time:  Tuesday Jan 06 2015 17:32:28 EDT Ventricular Rate:  40 PR Interval:    QRS Duration: 110 QT Interval:  584 QTC Calculation: 475 R Axis:   46 Text Interpretation:  Critical Test Result:  AV Block Sinus tachycardia  with A-V dissociation and Junctional bradycardia with Sinus/atrial capture  Incomplete right bundle branch block Possible Lateral infarct , age  undetermined Abnormal ECG Confirmed by Alencia Gordon  MD, Mekaila Tarnow [phone]) on  01/06/2015 5:34:26 PM Also confirmed by Rogene Houston  MD, Jadaya Sommerfield [phone])  on  01/06/2015 5:47:11 PM      CRITICAL CARE Performed by: Fredia Sorrow Total critical care time: 30 Critical care time was exclusive of separately billable procedures and treating other patients. Critical care was necessary to treat or prevent imminent or life-threatening deterioration. Critical care was time spent personally by me on the following activities: development of treatment plan with patient and/or surrogate as well as nursing, discussions with consultants, evaluation of patient's response to treatment, examination of patient, obtaining history from patient or surrogate, ordering and performing treatments and interventions, ordering and review of laboratory studies, ordering and review of radiographic  studies, pulse oximetry and re-evaluation of patient's condition. Care     MDM   Final diagnoses:  SOB (shortness of breath)  Heart block, AV   Patient presents with acute onset of shortness of breath and lightheadedness today. 2 days ago she did have left-sided chest discomfort that she thought was indigestion. Patient has no known cardiac history other than history of a heart murmur no stents no known coronary artery disease. Patient's main medical problem is COPD and she is followed by Dr. Joya Gaskins from pulmonary medicine for that.  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 Discussed with on-call cardiology Dr. Harrington Challenger. Patient will [facility]. Patient has pacer pads in place. Patient's blood pressure is fine. Patient's mentating fine. Patient is not on any beta blockers or calcium channel blockers. EKG does show some  peaked T waves so potassium maybe being slightly elevated is a possibility but no reason to suspect that based on her medications. Also possible patient could've had a coronary event 2 days ago when she had the chest discomfort. EKG does not show any Q waves.      Fredia Sorrow, MD 01/06/15 1807

## 2015-01-06 NOTE — ED Notes (Signed)
Sob and dizziness on and off all day.

## 2015-01-06 NOTE — Plan of Care (Signed)
Problem: Phase I Progression Outcomes Goal: Ventricular heart rate < 120/min Outcome: Completed/Met Date Met:  01/06/15 Since presentation, patient has demonstrated 3rd degree AV block, 2nd degree AV block type II ([place]) and marked sinus bradycardia with 1st degree AV block.  No tachyarrhythmias noted. Currently, patient in sinus bradycardia with 1st degree AV block, rate in 45 - 50 range. Goal: Anticoagulation Therapy per MD order Outcome: Completed/Met Date Met:  01/06/15 Prophylactic anticoagulation (VTE dose) ordered per physician.  Patient is refusing SQ heparin.

## 2015-01-06 NOTE — Telephone Encounter (Signed)
Spoke with pt. She reports increased DOE, dizziness, low BP and pulse rate of 40. I spoke with BQ about this, he states that pt needs to get the ED asap. Pt is aware of this and will have one of her family members drive her. Nothing further was needed at this time.

## 2015-01-06 NOTE — H&P (Signed)
Cardiology History and Physical  PCP: Annye Asa, MD Cardiologist: None  History of Present Illness (and review of medical records): Jenna Marquez is a 69 y.o. female who presents for evaluation of near syncope.  She has hx of COPD.  She denies any hx of prior MI, known CAD, CMP, valvular disease or arrhythmia.  She states this am she noticed dizziness with ambulation at home.  She had to sit down for symptoms to go away.  She would then get up and symptoms would immediately return every time she got up.  She also had dyspnea with mild exertion.  She stated that these symptoms had happened before intermittently since around March, however, would resolved with rest.  It had slowly gotten progressively worse, occuring now about once a week.  However, symptoms today did not resolve as they did in the past.  She also reports an episode of chest discomfort, rated 7/10 on Sunday.  She stated it felt like ingestion and took 2 tums and her xanax and went to bed.  She went to sleep and awoke with no symptoms.  She does report taking more tums recently for worsening ingestion over the past 2-3 weeks. She presented to [facility] ED and was found to be in CHB and was transferred to [facility] for further evaluation.  She is with out chest pain, shortness of breath, dizziness at this time.  Previous diagnostic testing for coronary artery disease includes: echocardiogram and exercise treadmill test. Previous history of cardiac disease includes None. Coronary artery disease risk factors include: advanced age (older than 56 for men, 35 for women) and sedentary lifestyle.  Patient denies history of angina, arrhythmia, CHF, coronary artery disease, previous M.I. and valvular disease.  Echo 01/2012:  LV normal size. Mild concentric LVH. Overall LV systolic function is low normal, EF 50-55%. Normal diastolic filling pattern for age   Review of Systems Respiratory: positive for treated for recent presumed COPD  exacerbation. Cardiovascular: negative for exertional chest pressure/discomfort, irregular heart beat, lower extremity edema, orthopnea, palpitations, paroxysmal nocturnal dyspnea and syncope Further review of systems was otherwise negative other than stated in HPI.  Patient Active Problem List   Diagnosis Date Noted  . 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 Swollen uvula 11/14/2014  . Palpitations 08/16/2013  . COPD exacerbation 08/16/2013  . Vitamin D deficiency 01/30/2013  . Head injury 10/23/2012  . Tinnitus of both ears 10/23/2012  . Lumbar radicular pain 06/13/2012  . Blunt trauma of nose 06/06/2012  . Indigestion 04/23/2012  . Elevated BP 02/23/2012  . Generalized anxiety disorder 01/11/2012  . Chest pain 08/18/2011  . RBBB (right bundle branch block) 07/31/2011  . General medical examination 07/20/2011  . Abnormal mammogram 07/20/2011  . 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   . Tobacco abuse   . ALLERGIC RHINITIS 10/13/2010   Past Medical History  Diagnosis Date  . COPD (chronic obstructive pulmonary disease)   . Asthma   . Allergic rhinitis   . Anxiety     Past Surgical History  Procedure Laterality Date  . Ectopic pregnancy surgery  1974  . Tonsillectomy and adenoidectomy  1954    Prescriptions prior to admission  Medication Sig Dispense Refill Last Dose  . albuterol (PROAIR HFA) 108 (90 BASE) MCG/ACT inhaler Inhale 2 puffs into the lungs every 6 (six) hours as needed for wheezing or shortness of breath. 1 Inhaler 6   . ALPRAZolam (XANAX) 0.5 MG tablet TAKE 1 TABLET BY MOUTH EVERY NIGHT AT  BEDTIME AS NEEDED FOR SLEEP 30 tablet 0 Taking  . budesonide-formoterol (SYMBICORT) 160-4.5 MCG/ACT inhaler Take 2 puffs first thing in am and then another 2 puffs about 12 hours later. 1 Inhaler 11   . Calcium Carb-Cholecalciferol (CALTRATE 600+D SOFT) 600-800 MG-UNIT CHEW Chew 1 tablet by mouth 2 (two) times daily.   Taking  . cetirizine (ZYRTEC) 10 MG tablet  Take 10 mg by mouth daily.   Taking  . guaiFENesin-codeine (ROBITUSSIN AC) 100-10 MG/5ML syrup Take 10 mLs by mouth 3 (three) times daily as needed for cough. 240 mL 0 Taking  . ibuprofen (ADVIL,MOTRIN) 800 MG tablet TAKE 1 TABLET BY MOUTH EVERY 8 HOURS AS NEEDED 60 tablet 0 Taking  . levalbuterol (XOPENEX) 1.25 MG/3ML nebulizer solution Take 1.25 mg by nebulization every 8 (eight) hours as needed for wheezing. (Patient not taking: Reported on 12/24/2014) 72 mL 12 Not Taking  . Multiple Vitamin (MULTIVITAMIN) tablet Take 1 tablet by mouth every morning.   Taking  . predniSONE (DELTASONE) 20 MG tablet Take 1 tablet (20 mg total) by mouth daily with breakfast. (Patient not taking: Reported on 12/24/2014) 3 tablet 0 Not Taking  . Respiratory Therapy Supplies (FLUTTER) DEVI Use 3-4 times per day 1 each 0 Taking  . tiotropium (SPIRIVA) 18 MCG inhalation capsule Place 1 capsule (18 mcg total) into inhaler and inhale daily. 30 capsule 11   . Vitamin D, Ergocalciferol, (DRISDOL) 50000 UNITS CAPS capsule Take 1 capsule (50,000 Units total) by mouth every 7 (seven) days. 4 capsule 3 Taking   Allergies  Allergen Reactions  . Avelox [Moxifloxacin Hcl In Nacl] Itching and Rash    History  Substance Use Topics  . Smoking status: Former Smoker -- 1.00 packs/day for 35 years    Types: Cigarettes    Quit date: 08/10/2012  . Smokeless tobacco: Never Used     Comment: Started at age 44  . Alcohol Use: Yes     Comment: socially    Family History  Problem Relation Age of Onset  . Coronary artery disease Mother   . Heart disease Mother     CHF  . Diabetes Neg Hx   . Cancer Neg Hx   . Stroke Neg Hx   . Colon cancer Neg Hx      Objective:  Patient Vitals for the past 8 hrs:  BP Temp Temp src Pulse Resp SpO2 Height Weight  01/06/15 2050 (!) 213/193 mmHg 98.3 F (36.8 C) Oral (!) 48 (!) 22 100 % '5\' 5"'  (1.651 m) -  01/06/15 1940 179/55 mmHg 98.2 F (36.8 C) Oral 81 20 97 % - -  01/06/15 1930 (!) 169/48  mmHg - - (!) 44 12 99 % - -  01/06/15 1900 (!) 154/48 mmHg - - (!) 34 15 100 % - -  01/06/15 1822 (!) 171/130 mmHg - - (!) 40 16 99 % - -  01/06/15 1720 (!) 164/36 mmHg 98.2 F (36.8 C) Oral (!) 42 20 98 % '5\' 6"'  (1.676 m) 59.875 kg (132 lb)   General appearance: alert, cooperative, appears stated age and no distress Head: Normocephalic, without obvious abnormality, atraumatic Eyes: conjunctivae/corneas clear. PERRL, EOM's intact. Neck: no carotid bruit, no JVD and supple, Lungs: clear to auscultation bilaterally Chest wall: no tenderness Heart: regular rate and rhythm, S1, S2 normal, no murmur, click, rub or gallop Abdomen: soft, non-tender; bowel sounds normal Extremities: extremities normal, atraumatic, no cyanosis or edema Pulses: 2+ and symmetric Neurologic: Grossly normal  Results for orders  placed or performed during the hospital encounter of 01/06/15 (from the past 48 hour(s))  Troponin I     Status: None   Collection Time: 01/06/15  5:39 PM  Result Value Ref Range   Troponin I <0.03 <0.031 ng/mL    Comment:        NO INDICATION OF MYOCARDIAL INJURY.   CBC with Differential/Platelet     Status: Abnormal   Collection Time: 01/06/15  5:39 PM  Result Value Ref Range   WBC 6.2 4.0 - 10.5 K/uL   RBC 3.52 (L) 3.87 - 5.11 MIL/uL   Hemoglobin 12.7 12.0 - 15.0 g/dL   HCT 35.9 (L) 36.0 - 46.0 %   MCV 102.0 (H) 78.0 - 100.0 fL   MCH 36.1 (H) 26.0 - 34.0 pg   MCHC 35.4 30.0 - 36.0 g/dL   RDW 12.1 11.5 - 15.5 %   Platelets 262 150 - 400 K/uL   Neutrophils Relative % 64 43 - 77 %   Neutro Abs 4.0 1.7 - 7.7 K/uL   Lymphocytes Relative 24 12 - 46 %   Lymphs Abs 1.5 0.7 - 4.0 K/uL   Monocytes Relative 11 3 - 12 %   Monocytes Absolute 0.7 0.1 - 1.0 K/uL   Eosinophils Relative 0 0 - 5 %   Eosinophils Absolute 0.0 0.0 - 0.7 K/uL   Basophils Relative 1 0 - 1 %   Basophils Absolute 0.0 0.0 - 0.1 K/uL  Comprehensive metabolic panel     Status: Abnormal   Collection Time: 01/06/15   5:39 PM  Result Value Ref Range   Sodium 134 (L) 135 - 145 mmol/L   Potassium 4.2 3.5 - 5.1 mmol/L   Chloride 97 (L) 101 - 111 mmol/L   CO2 18 (L) 22 - 32 mmol/L   Glucose, Bld 86 65 - 99 mg/dL   BUN 24 (H) 6 - 20 mg/dL   Creatinine, Ser 1.14 (H) 0.44 - 1.00 mg/dL   Calcium 8.9 8.9 - 10.3 mg/dL   Total Protein 7.3 6.5 - 8.1 g/dL   Albumin 3.9 3.5 - 5.0 g/dL   AST 37 15 - 41 U/L   ALT 21 14 - 54 U/L   Alkaline Phosphatase 95 38 - 126 U/L   Total Bilirubin 0.7 0.3 - 1.2 mg/dL   GFR calc non Af Amer 47 (L) >60 mL/min   GFR calc Af Amer 55 (L) >60 mL/min    Comment: (NOTE) The eGFR has been calculated using the CKD EPI equation. This calculation has not been validated in all clinical situations. eGFR's persistently <60 mL/min signify possible Chronic Kidney Disease.    Anion gap 19 (H) 5 - 15   Dg Chest Port 1 View  01/06/2015   CLINICAL DATA:  Shortness of breath and dizziness.  COPD.  EXAM: PORTABLE CHEST - 1 VIEW  COMPARISON:  07/03/2014  FINDINGS: External pacer/ defibrillator. Midline trachea. Normal heart size. Atherosclerosis in the transverse aorta. No pleural effusion or pneumothorax. Mild right hemidiaphragm elevation. Lower lobe predominant interstitial thickening. Mild left base scarring.  IMPRESSION: No acute cardiopulmonary disease.  Peribronchial thickening which may relate to chronic bronchitis or smoking.  Aortic atherosclerosis.   Electronically Signed   By: Abigail Miyamoto M.D.   On: 01/06/2015 18:30    ECG:  532 pm HR 40 CHB, IRBBB, previous NSR HR 60, normal intervals.  946pm  2:1 AVB HR 48  Assessment: 13M with hx of COPD with no known cardiac history presents with  progressive dyspnea on exertion and near syncope.  She has also had increase in "heart burn" over the past few weeks, which is concerning for possible angina.    Plan: 1. Cardiology Admission  2. Continuous monitoring on Telemetry. 3. Repeat ekg on admit, prn chest pain or arrythmia 4. Trend cardiac  biomarkers, tsh 5. Will obtain TTE in am assess LV function, valvular disorder, and wall motion. 6. No clear apparent reversible causes of her CHB, likely will need PPM. Patient will also likely need LHC to rule out ischemic heart disease as cause of current presentation. 7. Discussed plan in detail with patient and family. Agreeable to proceed. 8. GI/ DVT Proph 9. Full code

## 2015-01-07 ENCOUNTER — Encounter (HOSPITAL_COMMUNITY): Payer: Self-pay | Admitting: Nurse Practitioner

## 2015-01-07 ENCOUNTER — Inpatient Hospital Stay (HOSPITAL_COMMUNITY): Payer: Medicare HMO

## 2015-01-07 ENCOUNTER — Encounter (HOSPITAL_COMMUNITY)
Admission: EM | Disposition: A | Discharge status: Home / Self Care | Payer: Medicare HMO | Source: Home / Self Care | Attending: Internal Medicine

## 2015-01-07 DIAGNOSIS — R0602 Shortness of breath: Secondary | ICD-10-CM

## 2015-01-07 DIAGNOSIS — I442 Atrioventricular block, complete: Secondary | ICD-10-CM

## 2015-01-07 DIAGNOSIS — I4439 Other atrioventricular block: Secondary | ICD-10-CM

## 2015-01-07 DIAGNOSIS — R079 Chest pain, unspecified: Secondary | ICD-10-CM

## 2015-01-07 HISTORY — PX: EP IMPLANTABLE DEVICE: SHX172B

## 2015-01-07 LAB — CBC
HCT: 36 % (ref 36.0–46.0)
HEMOGLOBIN: 11.8 g/dL — AB (ref 12.0–15.0)
MCH: 34.1 pg — ABNORMAL HIGH (ref 26.0–34.0)
MCHC: 32.8 g/dL (ref 30.0–36.0)
MCV: 104 fL — AB (ref 78.0–100.0)
PLATELETS: 223 10*3/uL (ref 150–400)
RBC: 3.46 MIL/uL — AB (ref 3.87–5.11)
RDW: 13.2 % (ref 11.5–15.5)
WBC: 4.1 10*3/uL (ref 4.0–10.5)

## 2015-01-07 LAB — PROTIME-INR
INR: 1.06 (ref 0.00–1.49)
Prothrombin Time: 14 seconds (ref 11.6–15.2)

## 2015-01-07 LAB — TSH: TSH: 2.011 u[IU]/mL (ref 0.350–4.500)

## 2015-01-07 LAB — BASIC METABOLIC PANEL
ANION GAP: 7 (ref 5–15)
BUN: 17 mg/dL (ref 6–20)
CO2: 27 mmol/L (ref 22–32)
CREATININE: 1.1 mg/dL — AB (ref 0.44–1.00)
Calcium: 8.7 mg/dL — ABNORMAL LOW (ref 8.9–10.3)
Chloride: 105 mmol/L (ref 101–111)
GFR calc Af Amer: 57 mL/min — ABNORMAL LOW (ref >60)
GFR, EST NON AFRICAN AMERICAN: 49 mL/min — AB (ref >60)
GLUCOSE: 94 mg/dL (ref 65–99)
Potassium: 4.4 mmol/L (ref 3.5–5.1)
Sodium: 139 mmol/L (ref 135–145)

## 2015-01-07 LAB — TROPONIN I
Troponin I: <0.03 ng/mL (ref <0.031)
Troponin I: <0.03 ng/mL (ref <0.031)

## 2015-01-07 LAB — T4, FREE: Free T4: 0.67 ng/dL (ref 0.61–1.12)

## 2015-01-07 SURGERY — PACEMAKER IMPLANT

## 2015-01-07 MED ORDER — MIDAZOLAM HCL 5 MG/5ML IJ SOLN
INTRAMUSCULAR | Status: DC | PRN
  Administered 2015-01-07 (×3): 1 mg via INTRAVENOUS
  Administered 2015-01-07: 2 mg via INTRAVENOUS

## 2015-01-07 MED ORDER — MIDAZOLAM HCL 5 MG/5ML IJ SOLN
INTRAMUSCULAR | Status: AC
  Filled 2015-01-07: qty 5

## 2015-01-07 MED ORDER — CEFAZOLIN SODIUM 1-5 GM-% IV SOLN
1.0000 g | Freq: Four times a day (QID) | INTRAVENOUS | Status: AC
  Administered 2015-01-07 – 2015-01-08 (×3): 1 g via INTRAVENOUS
  Filled 2015-01-07 (×3): qty 50

## 2015-01-07 MED ORDER — ACETAMINOPHEN 325 MG PO TABS: 325.0000 mg | ORAL_TABLET | ORAL | Status: DC | PRN

## 2015-01-07 MED ORDER — ASPIRIN EC 81 MG PO TBEC
81.0000 mg | DELAYED_RELEASE_TABLET | Freq: Every day | ORAL | Status: DC
  Filled 2015-01-07: qty 1

## 2015-01-07 MED ORDER — LIDOCAINE HCL (PF) 1 % IJ SOLN
INTRAMUSCULAR | Status: DC | PRN
  Administered 2015-01-07: 40 mL

## 2015-01-07 MED ORDER — ALBUTEROL SULFATE (2.5 MG/3ML) 0.083% IN NEBU: 3.0000 mL | INHALATION_SOLUTION | Freq: Four times a day (QID) | RESPIRATORY_TRACT | Status: DC | PRN

## 2015-01-07 MED ORDER — CETYLPYRIDINIUM CHLORIDE 0.05 % MT LIQD: 7.0000 mL | Freq: Two times a day (BID) | OROMUCOSAL | Status: DC

## 2015-01-07 MED ORDER — TIOTROPIUM BROMIDE MONOHYDRATE 18 MCG IN CAPS
18.0000 ug | ORAL_CAPSULE | Freq: Every day | RESPIRATORY_TRACT | Status: DC
  Administered 2015-01-08: 18 ug via RESPIRATORY_TRACT
  Filled 2015-01-07 (×2): qty 5

## 2015-01-07 MED ORDER — ONDANSETRON HCL 4 MG/2ML IJ SOLN: 4.0000 mg | Freq: Four times a day (QID) | INTRAMUSCULAR | Status: DC | PRN

## 2015-01-07 MED ORDER — DEXTROSE 5 % IV SOLN
2.0000 g | INTRAVENOUS | Status: DC | PRN
  Administered 2015-01-07: 2 g via INTRAVENOUS

## 2015-01-07 MED ORDER — BUDESONIDE-FORMOTEROL FUMARATE 160-4.5 MCG/ACT IN AERO
2.0000 | INHALATION_SPRAY | Freq: Two times a day (BID) | RESPIRATORY_TRACT | Status: DC
  Administered 2015-01-07 – 2015-01-08 (×2): 2 via RESPIRATORY_TRACT
  Filled 2015-01-07 (×2): qty 6

## 2015-01-07 MED ORDER — LIDOCAINE HCL (PF) 1 % IJ SOLN
INTRAMUSCULAR | Status: AC
  Filled 2015-01-07: qty 60

## 2015-01-07 MED ORDER — CEFAZOLIN SODIUM-DEXTROSE 2-3 GM-% IV SOLR
2.0000 g | INTRAVENOUS | Status: DC
  Filled 2015-01-07: qty 50

## 2015-01-07 MED ORDER — PANTOPRAZOLE SODIUM 40 MG PO TBEC: 40.0000 mg | DELAYED_RELEASE_TABLET | Freq: Every day | ORAL | Status: DC

## 2015-01-07 MED ORDER — SODIUM CHLORIDE 0.9 % IR SOLN
80.0000 mg | Status: DC
  Filled 2015-01-07: qty 2

## 2015-01-07 MED ORDER — CHLORHEXIDINE GLUCONATE 4 % EX LIQD: 60.0000 mL | Freq: Once | CUTANEOUS | Status: DC

## 2015-01-07 MED ORDER — ALPRAZOLAM 0.5 MG PO TABS
0.5000 mg | ORAL_TABLET | Freq: Every evening | ORAL | Status: DC | PRN
  Administered 2015-01-08: 0.5 mg via ORAL
  Filled 2015-01-07: qty 1

## 2015-01-07 MED ORDER — FENTANYL CITRATE (PF) 100 MCG/2ML IJ SOLN
INTRAMUSCULAR | Status: AC
  Filled 2015-01-07: qty 2

## 2015-01-07 MED ORDER — CHLORHEXIDINE GLUCONATE 4 % EX LIQD
CUTANEOUS | Status: AC
  Administered 2015-01-07: 10:00:00
  Filled 2015-01-07: qty 15

## 2015-01-07 MED ORDER — FENTANYL CITRATE (PF) 100 MCG/2ML IJ SOLN
INTRAMUSCULAR | Status: DC | PRN
  Administered 2015-01-07: 12.5 ug via INTRAVENOUS
  Administered 2015-01-07 (×2): 25 ug via INTRAVENOUS

## 2015-01-07 MED ORDER — HEPARIN (PORCINE) IN NACL 2-0.9 UNIT/ML-% IJ SOLN
INTRAMUSCULAR | Status: AC
  Filled 2015-01-07: qty 500

## 2015-01-07 MED ORDER — SODIUM CHLORIDE 0.9 % IV SOLN: INTRAVENOUS | Status: DC

## 2015-01-07 SURGICAL SUPPLY — 8 items
CABLE SURGICAL S-101-97-12 (CABLE) IMPLANT
LEAD CAPSURE NOVUS 45CM (Lead) ×2 IMPLANT
LEAD CAPSURE NOVUS 5076-52CM (Lead) ×2 IMPLANT
PACEMAKER ADAPTA DR ADDRL1 (Pacemaker) IMPLANT
PAD DEFIB LIFELINK (PAD) IMPLANT
PPM ADAPTA DR ADDRL1 (Pacemaker) ×3 IMPLANT
SHEATH COOK PEEL AWAY SET 7F (SHEATH) ×4 IMPLANT
TRAY PACEMAKER INSERTION (CUSTOM PROCEDURE TRAY) IMPLANT

## 2015-01-07 NOTE — Consult Note (Signed)
ELECTROPHYSIOLOGY CONSULT NOTE    Patient ID: Jenna Marquez MRN: [id number], DOB/AGE: [birth year] 69 y.o.  Admit date: 01/06/2015 Date of Consult: 01/07/2015  Primary Physician: Annye Asa, MD Primary Cardiologist: new to [facility]  Reason for Consultation: heart block  HPI:  Bertice Risse is a 69 y.o. female with a past medical history significant for COPD, anxiety, asthma, and GERD. For the last 2 weeks she has had persistent exercise intolerance as well as shortness of breath and dizziness. She has not had frank syncope.  She was evaluated at [place] and was found to be in 2:1 heart block and was transferred to [facility] for further evaluation. She is on no AVN blocking agents and lab work was unremarkable. She has had intermittent indigestion and chest soreness at rest but none with exertion.  Troponin has been negative.   She currently denies chest pain, shortness of breath at rest, recent fever, chills, nausea or vomiting.    Echo demonstrates preserved LV function.   Past Medical History  Diagnosis Date  . COPD (chronic obstructive pulmonary disease)   . Asthma   . Allergic rhinitis   . 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 GERD (gastroesophageal reflux disease)   . Anxiety     "occasionally"     Surgical History:  Past Surgical History  Procedure Laterality Date  . Ectopic pregnancy surgery  1974  . Tonsillectomy and adenoidectomy  1954     Prescriptions prior to admission  Medication Sig Dispense Refill Last Dose  . ALPRAZolam (XANAX) 0.5 MG tablet TAKE 1 TABLET BY MOUTH EVERY NIGHT AT BEDTIME AS NEEDED FOR SLEEP 30 tablet 0 Past Month at Unknown time  . Ascorbic Acid (VITAMIN C GUMMIE PO) Take 1 tablet by mouth daily as needed (for colds).   01/06/2015 at Unknown time  . budesonide-formoterol (SYMBICORT) 160-4.5 MCG/ACT inhaler Take 2 puffs first thing in am and then another 2 puffs about 12 hours later. 1 Inhaler 11 01/06/2015 at Unknown  time  . Calcium Carb-Cholecalciferol (CALTRATE 600+D SOFT) 600-800 MG-UNIT CHEW Chew 1 tablet by mouth 2 (two) times daily.   01/06/2015 at Unknown time  . Cholecalciferol (VITAMIN D) 2000 UNITS CAPS Take 1 capsule by mouth daily.   01/06/2015 at Unknown time  . guaiFENesin-codeine (ROBITUSSIN AC) 100-10 MG/5ML syrup Take 10 mLs by mouth 3 (three) times daily as needed for cough. 240 mL 0 Past Month at Unknown time  . levalbuterol (XOPENEX) 1.25 MG/3ML nebulizer solution Take 1.25 mg by nebulization every 8 (eight) hours as needed for wheezing. 72 mL 12 01/06/2015 at Unknown time  . loratadine-pseudoephedrine (CLARITIN-D 24-HOUR) 10-240 MG per 24 hr tablet Take 1 tablet by mouth daily as needed for allergies.   Past Week at Unknown time  . Multiple Vitamin (MULTIVITAMIN) tablet Take 2 tablets by mouth every morning.    01/06/2015 at Unknown time  . tiotropium (SPIRIVA) 18 MCG inhalation capsule Place 1 capsule (18 mcg total) into inhaler and inhale daily. 30 capsule 11 01/06/2015 at Unknown time  . Vitamin D, Ergocalciferol, (DRISDOL) 50000 UNITS CAPS capsule Take 1 capsule (50,000 Units total) by mouth every 7 (seven) days. (Patient taking differently: Take 50,000 Units by mouth every 7 (seven) days. Take every Thursday) 4 capsule 3 Past Week at Unknown time  . albuterol (PROAIR HFA) 108 (90 BASE) MCG/ACT inhaler Inhale 2 puffs into the lungs every 6 (six) hours as needed for wheezing or shortness of breath. (Patient not taking:  Reported on 01/07/2015) 1 Inhaler 6 Not Taking  . ibuprofen (ADVIL,MOTRIN) 800 MG tablet TAKE 1 TABLET BY MOUTH EVERY 8 HOURS AS NEEDED (Patient taking differently: TAKE 1 TABLET BY MOUTH EVERY 8 HOURS AS NEEDED FOR BACK PAIN) 60 tablet 0 Last Year  . predniSONE (DELTASONE) 20 MG tablet Take 1 tablet (20 mg total) by mouth daily with breakfast. (Patient not taking: Reported on 12/24/2014) 3 tablet 0 Not Taking at Unknown time  . Respiratory Therapy Supplies (FLUTTER) DEVI Use 3-4 times  per day (Patient not taking: Reported on 01/07/2015) 1 each 0 Not Taking at Unknown time    Inpatient Medications:  . antiseptic oral rinse  7 mL Mouth Rinse BID  . aspirin EC  81 mg Oral Daily  . budesonide-formoterol  2 puff Inhalation BID  . heparin  5,000 Units Subcutaneous 3 times per day  . pantoprazole  40 mg Oral Daily  . tiotropium  18 mcg Inhalation Daily    Allergies:  Allergies  Allergen Reactions  . Avelox [Moxifloxacin Hcl In Nacl] Itching and Rash    History   Social History  . Marital Status: Widowed    Spouse Name: N/A  . Number of Children: 1  . Years of Education: N/A   Occupational History  . 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 Smoking status: Former Smoker -- 1.00 packs/day for 35 years    Types: Cigarettes    Quit date: 08/10/2012  . Smokeless tobacco: Never Used     Comment: Started at age 9  . Alcohol Use: 16.8 oz/week    28 Shots of liquor per week     Comment: 01/06/2015 "2 shot mixed drinks, 2/day"  . Drug Use: No  . Sexual Activity: No   Other Topics Concern  . Not on file   Social History Narrative   Lost husband in 12-2011, she lost her sister in 2012, brother in July 2013. She lives by herself, God son stayed with her. She retired as Radio producer.     Family History  Problem Relation Age of Onset  . Coronary artery disease Mother   . Heart disease Mother     CHF  . Diabetes Neg Hx   . Cancer Neg Hx   . Stroke Neg Hx   . Colon cancer Neg Hx      Review of Systems: All other systems reviewed and are otherwise negative except as noted above.  Physical Exam: Filed Vitals:   01/07/15 0001 01/07/15 0400 01/07/15 0830 01/07/15 0903  BP: 133/41 134/41 131/48   Pulse: 46 82  40  Temp: 98.2 F (36.8 C) 98.4 F (36.9 C) 97.5 F (36.4 C)   TempSrc: Oral Oral Oral   Resp: 15 19  19   Height:      Weight:  129 lb 9.7 oz (58.788 kg)    SpO2: 100% 100%  100%    GEN- The patient is  elderly and thin appearing, alert and oriented x 3 today.   HEENT: normocephalic, atraumatic; sclera clear, conjunctiva pink; hearing intact; oropharynx clear; neck supple, no JVP Lymph- no cervical lymphadenopathy Lungs- Clear to ausculation bilaterally, normal work of breathing.  No wheezes, rales, rhonchi Heart- Bradycardic regular rate and rhythm, no murmurs, rubs or gallops  GI- soft, non-tender, non-distended, bowel sounds present  Extremities- no clubbing, cyanosis, or edema; DP/PT/radial pulses 2+ bilaterally MS- no significant deformity or atrophy Skin- warm and dry, no rash or  lesion Psych- tearful Neuro- strength and sensation are intact  Labs:   Lab Results  Component Value Date   WBC 4.1 01/07/2015   HGB 11.8* 01/07/2015   HCT 36.0 01/07/2015   MCV 104.0* 01/07/2015   PLT 223 01/07/2015     Recent Labs Lab 01/06/15 1739 01/07/15 0432  NA 134* 139  K 4.2 4.4  CL 97* 105  CO2 18* 27  BUN 24* 17  CREATININE 1.14* 1.10*  CALCIUM 8.9 8.7*  PROT 7.3  --   BILITOT 0.7  --   ALKPHOS 95  --   ALT 21  --   AST 37  --   GLUCOSE 86 94      Radiology/Studies: Dg Chest Port 1 View 01/06/2015   CLINICAL DATA:  Shortness of breath and dizziness.  COPD.  EXAM: PORTABLE CHEST - 1 VIEW  COMPARISON:  07/03/2014  FINDINGS: External pacer/ defibrillator. Midline trachea. Normal heart size. Atherosclerosis in the transverse aorta. No pleural effusion or pneumothorax. Mild right hemidiaphragm elevation. Lower lobe predominant interstitial thickening. Mild left base scarring.  IMPRESSION: No acute cardiopulmonary disease.  Peribronchial thickening which may relate to chronic bronchitis or smoking.  Aortic atherosclerosis.   Electronically Signed   By: Abigail Miyamoto M.D.   On: 01/06/2015 18:30    EKG:2:1 heart block, ventricular rate 48  TELEMETRY: high grade heart block with periods of complete heart block, ventricular rate 40's  Assessment/Plan: 1.  Mobitz II heart  block/complete heart block The patient presents with symptomatic mobitz II and complete heart block.  She has no reversible causes identified.  She meets class I indication for pacemaker implantation. Risks, benefits were reviewed with the patient who is contemplating her decision.  Echo is being done this morning.  If she decides to proceed with pacemaker implantation, will plan to do today.   2.  Atypical chest pain Her chest pain is not typical and was relieved with Maalox.  Troponins have been negative.  She has not had exertional symptoms. Will not plan ischemic evaluation at this time.   3.  COPD Continue home meds  Signed, Chanetta Marshall, NP 01/07/2015 10:15 AM  EP Attending  Patient seen and examined. Agree with above history, exam, data and plan. She has symptomatic complete alternating with 2:1 AV block. I have discussed the risks/benefits/goals/expectations of PPM insertion with the patient and she wishes to proceed.  Mikle Bosworth.D.

## 2015-01-07 NOTE — Progress Notes (Signed)
  Echocardiogram 2D Echocardiogram has been performed.  Jenna Marquez 01/07/2015, 10:41 AM

## 2015-01-08 ENCOUNTER — Other Ambulatory Visit: Payer: Self-pay | Admitting: Cardiology

## 2015-01-08 ENCOUNTER — Telehealth: Payer: Self-pay | Admitting: Internal Medicine

## 2015-01-08 ENCOUNTER — Inpatient Hospital Stay (HOSPITAL_COMMUNITY): Payer: Medicare HMO

## 2015-01-08 MED FILL — Heparin Sodium (Porcine) 2 Unit/ML in Sodium Chloride 0.9%: INTRAMUSCULAR | Qty: 500 | Status: AC

## 2015-01-08 MED FILL — Lidocaine HCl Local Preservative Free (PF) Inj 1%: INTRAMUSCULAR | Qty: 30 | Status: AC

## 2015-01-08 NOTE — Telephone Encounter (Signed)
Discussed with Dr Lovena Le and he advises the patient to wait 6 weeks until teeth cleaned   She is aware

## 2015-01-08 NOTE — Telephone Encounter (Signed)
New Message       Pt calling wanting to know if she can get her teeth cleaned. Please call back and advise.

## 2015-01-08 NOTE — Discharge Summary (Signed)
ELECTROPHYSIOLOGY PROCEDURE DISCHARGE SUMMARY    Patient ID: Jenna Marquez,  MRN: [id number], DOB/AGE: 1946/03/27 69 y.o.  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  COPD 2.  Anxiety 3.  Asthma 4.  GERD  Allergies  Allergen Reactions  . Avelox [Moxifloxacin Hcl In Nacl] Itching and Rash     Procedures This Admission:  1.  Echocardiogram on 01/07/15 demonstrated EF 65%, no RWMA   2.  Implantation of a Medtronic dual chamber PPM on 01/07/15 by Dr Lovena Le.  The patient received a Medtronic model number [id number] PPM with model number 5076 right atrial lead and 5076 right ventricular lead. There were no immediate post procedure complications. 2.  CXR on 01/08/15 demonstrated no pneumothorax status post device implantation.   Brief HPI/Hospital Course:  Jenna Marquez is a 69 y.o. female with a past medical history as outlined above. She presented to the hospital after a 2 week period of fatigue, shortness of breath and dizziness and was found to be in 2:1 heart block.  Lab work was unremarkable. Echocardiogram demonstrated normal LV function.  She had been having some atypical chest pain that was relieved with Maalox.  Cardiac enzymes were negative. There were no reversible causes to heart block identified.  she was seen by Dr Lovena Le who recommended pacemaker implantation. Risks, benefits, and alternatives to PPM implantation were reviewed with the patient who wished to proceed.  The patient underwent implantation of a MDT dual chamber pacemaker with details as outlined above.  She  was monitored on telemetry overnight which demonstrated sinus rhythm with ventricular pacing.  Left chest was without hematoma or ecchymosis.  The device was interrogated and found to be  functioning normally.  CXR was obtained and demonstrated no pneumothorax status post device implantation.  Wound care, arm mobility, and restrictions were reviewed with the patient.  The patient was examined and considered stable for discharge to home.    Physical Exam: Filed Vitals:   01/07/15 1705 01/07/15 1806 01/07/15 2100 01/08/15 0309  BP: 95/81 122/52 140/54   Pulse: 104 83 92   Temp:   97.8 F (36.6 C) 97.7 F (36.5 C)  TempSrc:   Oral Oral  Resp: 17 20 23    Height:      Weight:    128 lb 9.6 oz (58.333 kg)  SpO2: 95% 96% 97%     GEN- The patient is well appearing, alert and oriented x 3 today.   HEENT: normocephalic, atraumatic; sclera clear, conjunctiva pink; hearing intact; oropharynx clear; neck supple, no JVP Lungs- Clear to ausculation bilaterally, normal work of breathing.  No wheezes, rales, rhonchi Heart- Regular rate and rhythm (paced) GI- soft, non-tender, non-distended, bowel sounds present  Extremities- no clubbing, cyanosis, or edema; DP/PT/radial pulses 2+ bilaterally MS- no significant deformity or atrophy Skin- warm and dry, no rash or lesion, left chest without hematoma/ecchymosis Psych- euthymic mood, full affect Neuro- strength and sensation are intact   Labs:   Lab Results  Component Value Date   WBC 4.1 01/07/2015   HGB 11.8* 01/07/2015   HCT 36.0 01/07/2015   MCV 104.0* 01/07/2015   PLT 223 01/07/2015     Recent Labs Lab 01/06/15 1739 01/07/15 0432  NA 134* 139  K 4.2 4.4  CL 97* 105  CO2 18* 27  BUN 24* 17  CREATININE 1.14* 1.10*  CALCIUM 8.9 8.7*  PROT 7.3  --   BILITOT 0.7  --   ALKPHOS 95  --   ALT 21  --   AST 37  --   GLUCOSE 86 94    Discharge Medications:    Medication List    TAKE these medications        albuterol 108 (90 BASE) MCG/ACT inhaler  Commonly known as:  PROAIR HFA  Inhale 2 puffs into the lungs every 6 (six) hours as needed for wheezing or shortness of breath.     ALPRAZolam 0.5 MG tablet    Commonly known as:  XANAX  TAKE 1 TABLET BY MOUTH EVERY NIGHT AT BEDTIME AS NEEDED FOR SLEEP     budesonide-formoterol 160-4.5 MCG/ACT inhaler  Commonly known as:  SYMBICORT  Take 2 puffs first thing in am and then another 2 puffs about 12 hours later.     CALTRATE 600+D SOFT 600-800 MG-UNIT Chew  Generic drug:  Calcium Carb-Cholecalciferol  Chew 1 tablet by mouth 2 (two) times daily.     FLUTTER Devi  Use 3-4 times per day     guaiFENesin-codeine 100-10 MG/5ML syrup  Commonly known as:  ROBITUSSIN AC  Take 10 mLs by mouth 3 (three) times daily as needed for cough.     ibuprofen 800 MG tablet  Commonly known as:  ADVIL,MOTRIN  TAKE 1 TABLET BY MOUTH EVERY 8 HOURS AS NEEDED     levalbuterol 1.25 MG/3ML nebulizer solution  Commonly known as:  XOPENEX  Take 1.25 mg by nebulization every 8 (eight) hours as needed for wheezing.     loratadine-pseudoephedrine 10-240 MG per 24 hr tablet  Commonly known as:  CLARITIN-D 24-hour  Take 1 tablet by mouth daily as needed for allergies.     multivitamin tablet  Take 2 tablets by mouth every morning.     predniSONE 20 MG tablet  Commonly known as:  DELTASONE  Take 1 tablet (20 mg total) by mouth daily with breakfast.     tiotropium 18 MCG inhalation capsule  Commonly known as:  SPIRIVA  Place 1 capsule (18 mcg total) into inhaler and inhale daily.     VITAMIN C GUMMIE PO  Take 1 tablet by mouth daily as needed (for colds).     Vitamin D (Ergocalciferol) 50000 UNITS Caps capsule  Commonly known as:  DRISDOL  Take 1 capsule (50,000 Units total) by mouth every 7 (seven) days.     Vitamin D 2000 UNITS Caps  Take 1 capsule by mouth daily.        Disposition:  Discharge Instructions    Diet - low sodium heart healthy    Complete by:  As directed      Increase activity slowly    Complete by:  As directed           Follow-up Information    Follow up with [facility] On 01/22/2015.   Why:  at 10:30AM for wound check     Contact information:   [address] [place] [id number]       Duration of Discharge Encounter: Greater than 30 minutes including physician time.  Signed, Chanetta Marshall, NP 01/08/2015 8:47 AM   EP Attending  Patient seen and examined. Agree with above. She is doing well after PPM insertion and her device is working normally. Will recheck in several months.   Mikle Bosworth.D.

## 2015-01-08 NOTE — Discharge Instructions (Signed)
° ° °  Supplemental Discharge Instructions for  Pacemaker/Defibrillator Patients  Activity No heavy lifting or vigorous activity with your left/right arm for 6 to 8 weeks.  Do not raise your left/right arm above your head for one week.  Gradually raise your affected arm as drawn below.           __      01/11/15                   01/12/15                      01/13/15                     01/14/15  NO DRIVING for  1 week   ; you may begin driving on  04/17/43   .  WOUND CARE - Keep the wound area clean and dry.  Do not get this area wet for one week. No showers for one week; you may shower on  01/14/15   . - The tape/steri-strips on your wound will fall off; do not pull them off.  No bandage is needed on the site.  DO  NOT apply any creams, oils, or ointments to the wound area. - If you notice any drainage or discharge from the wound, any swelling or bruising at the site, or you develop a fever > 101? F after you are discharged home, call the office at once.  Special Instructions - You are still able to use cellular telephones; use the ear opposite the side where you have your pacemaker/defibrillator.  Avoid carrying your cellular phone near your device. - When traveling through airports, show security personnel your identification card to avoid being screened in the metal detectors.  Ask the security personnel to use the hand wand. - Avoid arc welding equipment, MRI testing (magnetic resonance imaging), TENS units (transcutaneous nerve stimulators).  Call the office for questions about other devices. - Avoid electrical appliances that are in poor condition or are not properly grounded. - Microwave ovens are safe to be near or to operate.

## 2015-01-08 NOTE — Telephone Encounter (Signed)
Error

## 2015-01-09 ENCOUNTER — Telehealth: Payer: Self-pay | Admitting: *Deleted

## 2015-01-09 ENCOUNTER — Telehealth: Payer: Self-pay | Admitting: Internal Medicine

## 2015-01-09 NOTE — Telephone Encounter (Signed)
Spoke with patient and she says she spoke with on-call last night and was told to take 800mg  of Ibuprofen She had taken that about an hour prior to and so was advised to take 2 extra strength Tylenol.  She says this helped and she went back to sleep.  This morning she is good but has some questions in regards to  Her device and cell phone usage as well as portable phones.  We went over the usage for both and advised to place phone on right ear.  She verbalized plan and I will call her back this afternoon to make sure her shoulder okay as it is a holiday weekend

## 2015-01-09 NOTE — Telephone Encounter (Signed)
No need unless she has other concerns

## 2015-01-09 NOTE — Telephone Encounter (Signed)
Called and spoke with patient and she is doing good  Having little pain at this point

## 2015-01-09 NOTE — Telephone Encounter (Signed)
Patient discharged from hospital 01/08/15 after pacemaker implantation.  D/c instructions state to follow-up with cardiology, would you like her to follow-up here as well?

## 2015-01-09 NOTE — Telephone Encounter (Signed)
New Message      Pt calling stating that she is having severe left shoulder pain that started last night. Please call back and advise.

## 2015-01-13 ENCOUNTER — Telehealth: Payer: Self-pay | Admitting: Internal Medicine

## 2015-01-13 NOTE — Telephone Encounter (Signed)
Patient had pacemaker put in on 5/25. Since being discharged her left arm has been sore, right foot swollen (no pitting) and right ankle is red. Patient states that she has been taking Ibuprofen and tylenol for pain and the LUE somewhat better, but it is still sore. Patient states her surgery site looks like it is healing. Patient denies any other pain and there is no pain in RLE. Encouraged patient to elevated her BLE and make sure she takes her Ibuprofen with food. Will forward to Dr. Lovena Le and his nurse. for further instructions.

## 2015-01-13 NOTE — Telephone Encounter (Signed)
Pt c/o swelling: STAT is pt has developed SOB within 24 hours  1. How long have you been experiencing swelling? Last couple of days  2. Where is the swelling located? Feet and ankles  3.  Are you currently taking a "fluid pill"? No  4.  Are you currently SOB? Yes, but she has COPD and Asthma so she doesn't know if it is that  5.  Have you traveled recently? No

## 2015-01-13 NOTE — Telephone Encounter (Signed)
Pt called back c/o foot starting to peel-and having some SOB took two puffs of inhaler-pt very upset

## 2015-01-22 ENCOUNTER — Ambulatory Visit (INDEPENDENT_AMBULATORY_CARE_PROVIDER_SITE_OTHER): Payer: Medicare HMO | Admitting: *Deleted

## 2015-01-22 ENCOUNTER — Encounter: Payer: Self-pay | Admitting: Internal Medicine

## 2015-01-22 DIAGNOSIS — Z950 Presence of cardiac pacemaker: Secondary | ICD-10-CM | POA: Diagnosis not present

## 2015-01-22 DIAGNOSIS — I4430 Unspecified atrioventricular block: Secondary | ICD-10-CM | POA: Diagnosis not present

## 2015-01-22 LAB — CUP PACEART INCLINIC DEVICE CHECK
Battery Impedance: 100 Ohm
Battery Remaining Longevity: 112 mo
Date Time Interrogation Session: 20160609134406
Lead Channel Impedance Value: 564 Ohm
Lead Channel Pacing Threshold Amplitude: 0.5 V
Lead Channel Pacing Threshold Pulse Width: 0.4 ms
Lead Channel Pacing Threshold Pulse Width: 0.4 ms
Lead Channel Sensing Intrinsic Amplitude: 11.2 mV
Lead Channel Setting Sensing Sensitivity: 2.8 mV
MDC IDC MSMT BATTERY VOLTAGE: 2.8 V
MDC IDC MSMT LEADCHNL RA SENSING INTR AMPL: 1.4 mV
MDC IDC MSMT LEADCHNL RV IMPEDANCE VALUE: 568 Ohm
MDC IDC MSMT LEADCHNL RV PACING THRESHOLD AMPLITUDE: 0.75 V
MDC IDC SET LEADCHNL RA PACING AMPLITUDE: 3.5 V
MDC IDC SET LEADCHNL RV PACING AMPLITUDE: 3.5 V
MDC IDC SET LEADCHNL RV PACING PULSEWIDTH: 0.4 ms
MDC IDC STAT BRADY AP VP PERCENT: 1 %
MDC IDC STAT BRADY AP VS PERCENT: 0 %
MDC IDC STAT BRADY AS VP PERCENT: 99 %
MDC IDC STAT BRADY AS VS PERCENT: 0 %

## 2015-01-22 NOTE — Telephone Encounter (Signed)
Spoke with patient and things are healing and she is feeling better.  Will keep her appointment for today

## 2015-01-22 NOTE — Progress Notes (Signed)
Wound check appointment. Steri-strips removed. Removed 1 suture---pt has very sensitive skin---as I was holding loose suture to cut w/ scissors, pt jerked back in chair causing suture to pull out instead of being cut. Wound without redness or edema. Incision edges approximated, wound well healed. Normal device function. Thresholds, sensing, and impedances consistent with implant measurements. Device programmed at 3.5V/auto capture programmed on for extra safety margin until 3 month visit. Histogram distribution appropriate for patient and level of activity. 5 mode switches--- <0.1%. No high ventricular rates noted. Pt conducting intrinsically---turned on search AV+ w/ max paced AV increase to 343ms. Patient educated about wound care, arm mobility, lifting restrictions. ROV w/ GT 04/22/15.

## 2015-01-26 ENCOUNTER — Telehealth: Payer: Self-pay | Admitting: Internal Medicine

## 2015-01-26 NOTE — Telephone Encounter (Signed)
Patient voiced understanding.

## 2015-01-26 NOTE — Telephone Encounter (Signed)
Spoke to patient about ppm incision site. Patient states that she's not sure if she's seeing a pimple or if it's a part of the healing. I asked patient if she could come in for an appt to have her site reevaluated. Patient agreed---appt made for 6/15 @ 1100. Patient encouraged to call if she notices any drainage, soreness, or redness prior to this appt.

## 2015-01-26 NOTE — Telephone Encounter (Signed)
New message       1. Has your device fired? no 2. Is you device beeping? no  3. Are you experiencing draining or swelling at device site? no  4. Are you calling to see if we received your device transmission? no 5. Have you passed out? No Pt had a recent pacemaker implanted last month.  She noticed a small pimple has developed over the pacemaker incision.  Is this normal?

## 2015-02-05 ENCOUNTER — Ambulatory Visit (INDEPENDENT_AMBULATORY_CARE_PROVIDER_SITE_OTHER): Payer: Medicare HMO | Admitting: *Deleted

## 2015-02-05 DIAGNOSIS — R52 Pain, unspecified: Secondary | ICD-10-CM

## 2015-02-05 LAB — CUP PACEART INCLINIC DEVICE CHECK: MDC IDC SESS DTM: 20160623153857

## 2015-02-05 NOTE — Progress Notes (Signed)
Pt c/o of "pimple" near device pocket. Upon closer look, slight elevation under skin is part of a surgical sleeve. Also, partial suture removed by GT. Device not interrogated. Follow up as planned, ROV w/ GT 04/22/15.

## 2015-03-20 ENCOUNTER — Other Ambulatory Visit: Payer: Self-pay | Admitting: Family Medicine

## 2015-03-20 NOTE — Telephone Encounter (Signed)
Medication filled to pharmacy as requested.   

## 2015-03-27 ENCOUNTER — Telehealth: Payer: Self-pay | Admitting: Internal Medicine

## 2015-03-27 NOTE — Telephone Encounter (Signed)
New message     Pt has pacemaker and is hurting in arm and shoulder on same side as pacemaker Pt wants to know if she can use heating pad

## 2015-03-27 NOTE — Telephone Encounter (Signed)
Returned patient's call regarding using a heating pad for muscle soreness related to "moving bird seed".  Patient reports muscle soreness on top of left shoulder but is concerned about using electric heating pad because of her pacemaker.  Explained to patient that as long as heating pad is in good working condition and is used as instructed, she can safely apply it to her left shoulder.  Instructed patient to call with worsening symptoms, questions, or concerns.  Patient agreeable and appreciate of call.

## 2015-04-22 ENCOUNTER — Ambulatory Visit (INDEPENDENT_AMBULATORY_CARE_PROVIDER_SITE_OTHER): Payer: Medicare HMO | Admitting: Internal Medicine

## 2015-04-22 ENCOUNTER — Encounter: Payer: Self-pay | Admitting: Internal Medicine

## 2015-04-22 VITALS — BP 140/88 | HR 95 | Ht 66.5 in | Wt 137.6 lb

## 2015-04-22 DIAGNOSIS — R0789 Other chest pain: Secondary | ICD-10-CM

## 2015-04-22 DIAGNOSIS — I442 Atrioventricular block, complete: Secondary | ICD-10-CM | POA: Diagnosis not present

## 2015-04-22 DIAGNOSIS — Z45018 Encounter for adjustment and management of other part of cardiac pacemaker: Secondary | ICD-10-CM | POA: Diagnosis not present

## 2015-04-22 DIAGNOSIS — J441 Chronic obstructive pulmonary disease with (acute) exacerbation: Secondary | ICD-10-CM | POA: Diagnosis not present

## 2015-04-22 DIAGNOSIS — Z950 Presence of cardiac pacemaker: Secondary | ICD-10-CM

## 2015-04-22 HISTORY — DX: Presence of cardiac pacemaker: Z95.0

## 2015-04-22 LAB — CUP PACEART INCLINIC DEVICE CHECK
Battery Impedance: 100 Ohm
Battery Remaining Longevity: 134 mo
Battery Voltage: 2.8 V
Brady Statistic AP VP Percent: 1 %
Brady Statistic AP VS Percent: 0 %
Brady Statistic AS VP Percent: 99 %
Brady Statistic AS VS Percent: 1 %
Lead Channel Impedance Value: 572 Ohm
Lead Channel Impedance Value: 626 Ohm
Lead Channel Pacing Threshold Amplitude: 0.5 V
Lead Channel Pacing Threshold Pulse Width: 0.4 ms
Lead Channel Pacing Threshold Pulse Width: 0.4 ms
Lead Channel Setting Pacing Amplitude: 1.5 V
Lead Channel Setting Pacing Amplitude: 2.5 V
Lead Channel Setting Pacing Pulse Width: 0.4 ms
Lead Channel Setting Sensing Sensitivity: 5.6 mV
MDC IDC MSMT LEADCHNL RA SENSING INTR AMPL: 1.4 mV
MDC IDC MSMT LEADCHNL RV PACING THRESHOLD AMPLITUDE: 0.75 V
MDC IDC MSMT LEADCHNL RV SENSING INTR AMPL: 15.67 mV
MDC IDC SESS DTM: 20160907121530

## 2015-04-22 NOTE — Assessment & Plan Note (Signed)
Her COPD has been well controlled. She will continue her current meds.

## 2015-04-22 NOTE — Patient Instructions (Addendum)
Medication Instructions:  Your physician recommends that you continue on your current medications as directed. Please refer to the Current Medication list given to you today.  Labwork: None ordered  Testing/Procedures: None ordered  Follow-Up: Remote monitoring is used to monitor your Pacemaker of ICD from home. This monitoring reduces the number of office visits required to check your device to one time per year. It allows [place] to keep an eye on the functioning of your device to ensure it is working properly. You are scheduled for a device check from home on 07/22/15. You may send your transmission at any time that day. If you have a wireless device, the transmission will be sent automatically. After your physician reviews your transmission, you will receive a postcard with your next transmission date.  Your physician wants you to follow-up in: 9 months with Dr. Lovena Le.  You will receive a reminder letter in the mail two months in advance. If you don't receive a letter, please call our office to schedule the follow-up appointment.   Any Other Special Instructions Will Be Listed Below (If Applicable). Thank you for choosing [facility]!!   Trinidad Curet, RN [phone]

## 2015-04-22 NOTE — Progress Notes (Signed)
HPI Jenna Marquez returns today for followup. She is a pleasant 69 yo woman with complete heart block who underwent PPM insertion approx. 3 months ago. In the interim, she has been stable. No syncope. No chest pain. She was initially sore over her PM insertion site. This has improved markedly.  Allergies  Allergen Reactions  . Avelox [Moxifloxacin Hcl In Nacl] Itching and Rash     Current Outpatient Prescriptions  Medication Sig Dispense Refill  . albuterol (PROAIR HFA) 108 (90 BASE) MCG/ACT inhaler Inhale 2 puffs into the lungs every 6 (six) hours as needed for wheezing or shortness of breath. 1 Inhaler 6  . ALPRAZolam (XANAX) 0.5 MG tablet TAKE 1 TABLET BY MOUTH EVERY NIGHT AT BEDTIME AS NEEDED FOR SLEEP 30 tablet 0  . Ascorbic Acid (VITAMIN C GUMMIE PO) Take 1 tablet by mouth daily as needed (for colds).    . budesonide-formoterol (SYMBICORT) 160-4.5 MCG/ACT inhaler Take 2 puffs first thing in am and then another 2 puffs about 12 hours later. 1 Inhaler 11  . Calcium Carb-Cholecalciferol (CALTRATE 600+D SOFT) 600-800 MG-UNIT CHEW Chew 1 tablet by mouth 2 (two) times daily.    . Cholecalciferol (VITAMIN D) 2000 UNITS CAPS Take 1 capsule by mouth daily.    Marland Kitchen guaiFENesin-codeine (ROBITUSSIN AC) 100-10 MG/5ML syrup Take 10 mLs by mouth 3 (three) times daily as needed for cough. 240 mL 0  . ibuprofen (ADVIL,MOTRIN) 800 MG tablet TAKE 1 TABLET BY MOUTH EVERY 8 HOURS AS NEEDED 60 tablet 3  . levalbuterol (XOPENEX) 1.25 MG/3ML nebulizer solution Take 1.25 mg by nebulization every 8 (eight) hours as needed for wheezing. 72 mL 12  . loratadine-pseudoephedrine (CLARITIN-D 24-HOUR) 10-240 MG per 24 hr tablet Take 1 tablet by mouth daily as needed for allergies.    . Multiple Vitamin (MULTIVITAMIN) tablet Take 2 tablets by mouth every morning.     . tiotropium (SPIRIVA) 18 MCG inhalation capsule Place 1 capsule (18 mcg total) into inhaler and inhale daily. 30 capsule 11   No current  facility-administered medications for this visit.     Past Medical History  Diagnosis Date  . COPD (chronic obstructive pulmonary disease)   . Asthma   . Allergic rhinitis   . 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 GERD (gastroesophageal reflux disease)   . Anxiety     "occasionally"    ROS:   All systems reviewed and negative except as noted in the HPI.   Past Surgical History  Procedure Laterality Date  . Ectopic pregnancy surgery  1974  . Tonsillectomy and adenoidectomy  1954  . Ep implantable device N/A 01/07/2015    Procedure: Pacemaker Implant;  Surgeon: Evans Lance, MD;  Location: [facility];  Service: Cardiovascular;  Laterality: N/A;     Family History  Problem Relation Age of Onset  . Coronary artery disease Mother   . Heart disease Mother     CHF  . Diabetes Neg Hx   . Cancer Neg Hx   . Stroke Neg Hx   . Colon cancer Neg Hx      Social History   Social History  . Marital Status: Widowed    Spouse Name: N/A  . Number of Children: 1  . Years of Education: N/A   Occupational History  . 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 Smoking status: Former Smoker -- 1.00 packs/day for 35 years  Types: Cigarettes    Quit date: 08/10/2012  . Smokeless tobacco: Never Used     Comment: Started at age 19  . Alcohol Use: 16.8 oz/week    28 Shots of liquor per week     Comment: 01/06/2015 "2 shot mixed drinks, 2/day"  . Drug Use: No  . Sexual Activity: No   Other Topics Concern  . Not on file   Social History Narrative   Lost husband in 12-2011, she lost her sister in 2012, brother in July 2013. She lives by herself, God son stayed with her. She retired as Radio producer.     BP 140/88 mmHg  Pulse 95  Ht 5' 6.5" (1.689 m)  Wt 137 lb 9.6 oz (62.415 kg)  BMI 21.88 kg/m2  Physical Exam:  Well appearing 69 yo woman, NAD HEENT: Unremarkable Neck:  6 cm JVD, no thyromegally Lymphatics:  No  adenopathy Back:  No CVA tenderness Lungs:  Clear with no wheezes, well healed PM insertion site. HEART:  Regular rate rhythm, no murmurs, no rubs, no clicks Abd:  soft, positive bowel sounds, no organomegally, no rebound, no guarding Ext:  2 plus pulses, no edema, no cyanosis, no clubbing Skin:  No rashes no nodules Neuro:  CN II through XII intact, motor grossly intact  EKG - nsr with RV pacing  DEVICE  Normal device function.  See PaceArt for details.   Assess/Plan:

## 2015-04-22 NOTE — Assessment & Plan Note (Signed)
This is improved. Her incision looks good.

## 2015-04-22 NOTE — Assessment & Plan Note (Signed)
Her medtronic DDD PM is working normally. Will recheck in several months. 

## 2015-04-30 ENCOUNTER — Encounter: Payer: Self-pay | Admitting: Pulmonary Disease

## 2015-04-30 ENCOUNTER — Ambulatory Visit (INDEPENDENT_AMBULATORY_CARE_PROVIDER_SITE_OTHER): Payer: Medicare HMO | Admitting: Pulmonary Disease

## 2015-04-30 VITALS — BP 131/79 | HR 97 | Temp 97.2°F | Ht 66.5 in | Wt 136.0 lb

## 2015-04-30 DIAGNOSIS — J449 Chronic obstructive pulmonary disease, unspecified: Secondary | ICD-10-CM | POA: Diagnosis not present

## 2015-04-30 NOTE — Progress Notes (Signed)
   Subjective:    Patient ID: Jenna Marquez, female    DOB: 07-Mar-1946, 69 y.o.   MRN: [id number]  HPI  Ex smoker , quit 2014 , For FU of Gold C Copd  She underwent PPM for CHB.  Chief Complaint  Patient presents with  . Follow-up    breathing is doing well. Had pacemaker put in May 2016.  left arm is sore since surgery, knot on left arm.     Breathing at baseline No wheeze, occ cough Compliant with meds - no excess saba use Does not want flu shot - since left arm sore & took it in jan 2016    Past Medical History  Diagnosis Date  . COPD (chronic obstructive pulmonary disease)   . Asthma   . Allergic rhinitis   . 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 GERD (gastroesophageal reflux disease)   . Anxiety     "occasionally"   Significant tests/ events  09/2010 FeV1 49% Fef 25 75 22%    Ct chest 07/2012 - no nodules  Review of Systems neg for any significant sore throat, dysphagia, itching, sneezing, nasal congestion or excess/ purulent secretions, fever, chills, sweats, unintended wt loss, pleuritic or exertional cp, hempoptysis, orthopnea pnd or change in chronic leg swelling. Also denies presyncope, palpitations, heartburn, abdominal pain, nausea, vomiting, diarrhea or change in bowel or urinary habits, dysuria,hematuria, rash, arthralgias, visual complaints, headache, numbness weakness or ataxia.     Objective:   Physical Exam  Gen. Pleasant, well-nourished, in no distress ENT - no lesions, no post nasal drip Neck: No JVD, no thyromegaly, no carotid bruits Lungs: no use of accessory muscles, no dullness to percussion, clear without rales or rhonchi  Cardiovascular: Rhythm regular, heart sounds  normal, no murmurs or gallops, no peripheral edema Musculoskeletal: No deformities, no cyanosis or clubbing        Assessment & Plan:

## 2015-04-30 NOTE — Assessment & Plan Note (Addendum)
Does not want Flu shot - tried to convince her  Stay on symbicort & spiriva Albuterol prn Discussed S & S of flare

## 2015-04-30 NOTE — Patient Instructions (Signed)
Flu shot Stay on symbicort & spiriva

## 2015-05-01 ENCOUNTER — Telehealth: Payer: Self-pay | Admitting: Pulmonary Disease

## 2015-05-01 NOTE — Telephone Encounter (Signed)
Spoke with Sharyn Lull. She reports she was going to tell pt "aveeno baby lotion eczema".  Called pt and she is aware. Nothing further needed

## 2015-05-04 ENCOUNTER — Encounter: Payer: Self-pay | Admitting: Internal Medicine

## 2015-05-05 ENCOUNTER — Telehealth: Payer: Self-pay | Admitting: Internal Medicine

## 2015-05-05 NOTE — Telephone Encounter (Signed)
New Message  Pt called states that she wasn't aware that she ok to now hold baterry operated items to her left ear. Pt requests a call back to discuss if it Is ok to hold electronics in her left hand near her left ear. Please call

## 2015-05-06 ENCOUNTER — Telehealth: Payer: Self-pay | Admitting: Pulmonary Disease

## 2015-05-06 NOTE — Telephone Encounter (Signed)
Ok for samples - otherwise take spiriva & xopenex nebs

## 2015-05-06 NOTE — Telephone Encounter (Signed)
Called spoke with pt. She reports she is in the donut hole currently. Her spiriva will cost $177. Her symbicort will cost $156 and proair will cost $155 a month. She can't afford this now. Pt was very upset on the phone crying that it is this amount. She has xopenex for PRN and wants to know if she can just stay on this? We don't have any proair or spiriva samples here in [place] office. Pt wants [place] to check for samples in the [place] office if possible.  Please advise RA thanks

## 2015-05-06 NOTE — Telephone Encounter (Signed)
I will check [place] office tomorrow. Hold until then.

## 2015-05-07 ENCOUNTER — Other Ambulatory Visit: Payer: Self-pay | Admitting: Pulmonary Disease

## 2015-05-07 MED ORDER — ALBUTEROL SULFATE (2.5 MG/3ML) 0.083% IN NEBU: INHALATION_SOLUTION | RESPIRATORY_TRACT | Status: DC

## 2015-05-07 MED ORDER — IPRATROPIUM BROMIDE 0.02 % IN SOLN: 0.5000 mg | Freq: Four times a day (QID) | RESPIRATORY_TRACT | Status: DC

## 2015-05-07 MED ORDER — ALBUTEROL SULFATE HFA 108 (90 BASE) MCG/ACT IN AERS: 2.0000 | INHALATION_SPRAY | Freq: Four times a day (QID) | RESPIRATORY_TRACT | Status: DC | PRN

## 2015-05-07 NOTE — Telephone Encounter (Signed)
Spoke with patient, sent RX for nebs. Patient states that ProAir is too expensive, sent in Ventolin to see if this is cheaper Left sample of ProAir Respiclick at front for patient

## 2015-05-07 NOTE — Telephone Encounter (Signed)
LMOVM w/ direct # to device clinic.  

## 2015-05-07 NOTE — Telephone Encounter (Signed)
Albuterol + atrovent nebs 4 times/d

## 2015-05-07 NOTE — Telephone Encounter (Signed)
Patient says that she has not taken nebulizers.  She wants to know if she can substitute her Spiriva, ProAir, Symbicort for a cheaper nebulizer medication.  Patient is not currently taking Xopenex, she says that she has not taken that since 2015.    RA - please advise.

## 2015-05-07 NOTE — Telephone Encounter (Signed)
Updated pt she can use battery operated devices she inquired about including an e-cigarrette, TV remote control, or cell phone. Pt was convinced she could not use any battery operated electronics at all for the remainder of her life due to having a ppm. Pt also thought she still had a 10lb weight limit. Pt has been implanted for four months. I instructed her to use her left arm to avoid long-term effects. I said she does not have any movement restrictions nor lifting restrictions.  Also updated pt she is safe to use an e-cig, remote control, and a tablet as long as tablet does not rest on her chest. Also updated pt she can use her phone w/ her left hand but advised her to only use her right ear (left side implant) when listening.

## 2015-05-11 ENCOUNTER — Encounter (HOSPITAL_BASED_OUTPATIENT_CLINIC_OR_DEPARTMENT_OTHER): Payer: Self-pay

## 2015-05-11 ENCOUNTER — Inpatient Hospital Stay (HOSPITAL_BASED_OUTPATIENT_CLINIC_OR_DEPARTMENT_OTHER)
Admission: EM | Admit: 2015-05-11 | Discharge: 2015-05-12 | DRG: 192 | Disposition: A | Discharge status: Home / Self Care | Payer: Medicare HMO | Attending: Internal Medicine | Admitting: Internal Medicine

## 2015-05-11 ENCOUNTER — Emergency Department (HOSPITAL_BASED_OUTPATIENT_CLINIC_OR_DEPARTMENT_OTHER): Payer: Medicare HMO

## 2015-05-11 DIAGNOSIS — F411 Generalized anxiety disorder: Secondary | ICD-10-CM | POA: Diagnosis present

## 2015-05-11 DIAGNOSIS — J45909 Unspecified asthma, uncomplicated: Secondary | ICD-10-CM | POA: Diagnosis present

## 2015-05-11 DIAGNOSIS — R071 Chest pain on breathing: Secondary | ICD-10-CM | POA: Diagnosis present

## 2015-05-11 DIAGNOSIS — Z87891 Personal history of nicotine dependence: Secondary | ICD-10-CM | POA: Diagnosis not present

## 2015-05-11 DIAGNOSIS — I4510 Unspecified right bundle-branch block: Secondary | ICD-10-CM | POA: Diagnosis present

## 2015-05-11 DIAGNOSIS — N183 Chronic kidney disease, stage 3 unspecified: Secondary | ICD-10-CM | POA: Diagnosis present

## 2015-05-11 DIAGNOSIS — J441 Chronic obstructive pulmonary disease with (acute) exacerbation: Secondary | ICD-10-CM | POA: Diagnosis present

## 2015-05-11 DIAGNOSIS — Z950 Presence of cardiac pacemaker: Secondary | ICD-10-CM

## 2015-05-11 DIAGNOSIS — M5416 Radiculopathy, lumbar region: Secondary | ICD-10-CM | POA: Diagnosis present

## 2015-05-11 DIAGNOSIS — Z66 Do not resuscitate: Secondary | ICD-10-CM | POA: Diagnosis present

## 2015-05-11 DIAGNOSIS — Z8249 Family history of ischemic heart disease and other diseases of the circulatory system: Secondary | ICD-10-CM | POA: Diagnosis not present

## 2015-05-11 DIAGNOSIS — R0602 Shortness of breath: Secondary | ICD-10-CM | POA: Diagnosis present

## 2015-05-11 HISTORY — DX: Chest pain on breathing: R07.1

## 2015-05-11 LAB — CBC WITH DIFFERENTIAL/PLATELET
BASOS ABS: 0 10*3/uL (ref 0.0–0.1)
Basophils Relative: 0 %
EOS PCT: 1 %
Eosinophils Absolute: 0.1 10*3/uL (ref 0.0–0.7)
HEMATOCRIT: 37.9 % (ref 36.0–46.0)
Hemoglobin: 13.3 g/dL (ref 12.0–15.0)
LYMPHS PCT: 19 %
Lymphs Abs: 1.5 10*3/uL (ref 0.7–4.0)
MCH: 37.2 pg — ABNORMAL HIGH (ref 26.0–34.0)
MCHC: 35.1 g/dL (ref 30.0–36.0)
MCV: 105.9 fL — AB (ref 78.0–100.0)
Monocytes Absolute: 0.8 10*3/uL (ref 0.1–1.0)
Monocytes Relative: 10 %
NEUTROS ABS: 5.5 10*3/uL (ref 1.7–7.7)
Neutrophils Relative %: 70 %
PLATELETS: 246 10*3/uL (ref 150–400)
RBC: 3.58 MIL/uL — AB (ref 3.87–5.11)
RDW: 13.2 % (ref 11.5–15.5)
WBC: 7.9 10*3/uL (ref 4.0–10.5)

## 2015-05-11 LAB — I-STAT VENOUS BLOOD GAS, ED
ACID-BASE EXCESS: 5 mmol/L — AB (ref 0.0–2.0)
Bicarbonate: 31 mEq/L — ABNORMAL HIGH (ref 20.0–24.0)
O2 Saturation: 59 %
PO2 VEN: 31 mmHg (ref 30.0–45.0)
Patient temperature: 98.6
TCO2: 32 mmol/L (ref 0–100)
pCO2, Ven: 49.7 mmHg (ref 45.0–50.0)
pH, Ven: 7.403 — ABNORMAL HIGH (ref 7.250–7.300)

## 2015-05-11 LAB — BASIC METABOLIC PANEL
ANION GAP: 9 (ref 5–15)
BUN: 15 mg/dL (ref 6–20)
CO2: 27 mmol/L (ref 22–32)
Calcium: 10.3 mg/dL (ref 8.9–10.3)
Chloride: 106 mmol/L (ref 101–111)
Creatinine, Ser: 1.12 mg/dL — ABNORMAL HIGH (ref 0.44–1.00)
GFR calc Af Amer: 56 mL/min — ABNORMAL LOW (ref >60)
GFR calc non Af Amer: 48 mL/min — ABNORMAL LOW (ref >60)
Glucose, Bld: 123 mg/dL — ABNORMAL HIGH (ref 65–99)
POTASSIUM: 4.5 mmol/L (ref 3.5–5.1)
SODIUM: 142 mmol/L (ref 135–145)

## 2015-05-11 LAB — GLUCOSE, CAPILLARY
GLUCOSE-CAPILLARY: 179 mg/dL — AB (ref 65–99)
Glucose-Capillary: 240 mg/dL — ABNORMAL HIGH (ref 65–99)

## 2015-05-11 LAB — TROPONIN I: Troponin I: <0.03 ng/mL (ref <0.031)

## 2015-05-11 LAB — ETHANOL: Alcohol, Ethyl (B): <5 mg/dL (ref <5)

## 2015-05-11 LAB — D-DIMER, QUANTITATIVE (NOT AT ARMC): D DIMER QUANT: 0.58 ug{FEU}/mL — AB (ref 0.00–0.48)

## 2015-05-11 MED ORDER — GUAIFENESIN ER 600 MG PO TB12
1200.0000 mg | ORAL_TABLET | Freq: Two times a day (BID) | ORAL | Status: DC
  Administered 2015-05-11 – 2015-05-12 (×3): 1200 mg via ORAL
  Filled 2015-05-11 (×3): qty 2

## 2015-05-11 MED ORDER — PANTOPRAZOLE SODIUM 40 MG IV SOLR
40.0000 mg | INTRAVENOUS | Status: DC
  Administered 2015-05-11: 40 mg via INTRAVENOUS
  Filled 2015-05-11: qty 40

## 2015-05-11 MED ORDER — ASPIRIN EC 325 MG PO TBEC
325.0000 mg | DELAYED_RELEASE_TABLET | Freq: Every day | ORAL | Status: DC
  Administered 2015-05-11: 325 mg via ORAL
  Filled 2015-05-11 (×2): qty 1

## 2015-05-11 MED ORDER — METHYLPREDNISOLONE SODIUM SUCC 125 MG IJ SOLR
60.0000 mg | Freq: Once | INTRAMUSCULAR | Status: AC
  Administered 2015-05-11: 60 mg via INTRAVENOUS
  Filled 2015-05-11: qty 2

## 2015-05-11 MED ORDER — LORAZEPAM 2 MG/ML IJ SOLN
1.0000 mg | Freq: Once | INTRAMUSCULAR | Status: AC
  Administered 2015-05-11: 1 mg via INTRAVENOUS
  Filled 2015-05-11: qty 1

## 2015-05-11 MED ORDER — LEVALBUTEROL HCL 0.63 MG/3ML IN NEBU
0.6300 mg | INHALATION_SOLUTION | Freq: Four times a day (QID) | RESPIRATORY_TRACT | Status: DC
  Administered 2015-05-11 – 2015-05-12 (×3): 0.63 mg via RESPIRATORY_TRACT
  Filled 2015-05-11 (×4): qty 3

## 2015-05-11 MED ORDER — METHYLPREDNISOLONE SODIUM SUCC 125 MG IJ SOLR
60.0000 mg | Freq: Three times a day (TID) | INTRAMUSCULAR | Status: DC
  Administered 2015-05-11 – 2015-05-12 (×3): 60 mg via INTRAVENOUS
  Filled 2015-05-11 (×3): qty 2

## 2015-05-11 MED ORDER — INSULIN ASPART 100 UNIT/ML ~~LOC~~ SOLN
0.0000 [IU] | Freq: Three times a day (TID) | SUBCUTANEOUS | Status: DC
  Administered 2015-05-11: 3 [IU] via SUBCUTANEOUS

## 2015-05-11 MED ORDER — ACETAMINOPHEN 325 MG PO TABS: 650.0000 mg | ORAL_TABLET | Freq: Four times a day (QID) | ORAL | Status: DC | PRN

## 2015-05-11 MED ORDER — LEVALBUTEROL HCL 0.63 MG/3ML IN NEBU
0.6300 mg | INHALATION_SOLUTION | RESPIRATORY_TRACT | Status: DC | PRN
  Administered 2015-05-12: 0.63 mg via RESPIRATORY_TRACT

## 2015-05-11 MED ORDER — ALBUTEROL SULFATE (2.5 MG/3ML) 0.083% IN NEBU
INHALATION_SOLUTION | RESPIRATORY_TRACT | Status: AC
  Administered 2015-05-11: 5 mg
  Filled 2015-05-11: qty 6

## 2015-05-11 MED ORDER — ALBUTEROL (5 MG/ML) CONTINUOUS INHALATION SOLN
INHALATION_SOLUTION | RESPIRATORY_TRACT | Status: AC
  Administered 2015-05-11: 10 mg/h via RESPIRATORY_TRACT
  Filled 2015-05-11: qty 20

## 2015-05-11 MED ORDER — GI COCKTAIL ~~LOC~~
30.0000 mL | Freq: Two times a day (BID) | ORAL | Status: DC | PRN
  Administered 2015-05-11: 30 mL via ORAL
  Filled 2015-05-11: qty 30

## 2015-05-11 MED ORDER — ALBUTEROL SULFATE (2.5 MG/3ML) 0.083% IN NEBU: 5.0000 mg | INHALATION_SOLUTION | RESPIRATORY_TRACT | Status: DC | PRN

## 2015-05-11 MED ORDER — SODIUM CHLORIDE 0.9 % IV SOLN
INTRAVENOUS | Status: DC
  Administered 2015-05-11 – 2015-05-12 (×2): via INTRAVENOUS

## 2015-05-11 MED ORDER — ONDANSETRON HCL 4 MG/2ML IJ SOLN: 4.0000 mg | Freq: Three times a day (TID) | INTRAMUSCULAR | Status: AC | PRN

## 2015-05-11 MED ORDER — IPRATROPIUM-ALBUTEROL 0.5-2.5 (3) MG/3ML IN SOLN: 3.0000 mL | Freq: Four times a day (QID) | RESPIRATORY_TRACT | Status: DC

## 2015-05-11 MED ORDER — HYDRALAZINE HCL 10 MG PO TABS
10.0000 mg | ORAL_TABLET | Freq: Three times a day (TID) | ORAL | Status: DC
  Administered 2015-05-11 – 2015-05-12 (×2): 10 mg via ORAL
  Filled 2015-05-11 (×3): qty 1

## 2015-05-11 MED ORDER — IPRATROPIUM-ALBUTEROL 0.5-2.5 (3) MG/3ML IN SOLN
3.0000 mL | RESPIRATORY_TRACT | Status: DC | PRN
  Administered 2015-05-11: 3 mL via RESPIRATORY_TRACT
  Filled 2015-05-11: qty 3

## 2015-05-11 MED ORDER — ENOXAPARIN SODIUM 40 MG/0.4ML ~~LOC~~ SOLN
40.0000 mg | SUBCUTANEOUS | Status: DC
  Filled 2015-05-11: qty 0.4

## 2015-05-11 MED ORDER — IPRATROPIUM BROMIDE 0.02 % IN SOLN
RESPIRATORY_TRACT | Status: AC
  Administered 2015-05-11: 0.5 mg
  Filled 2015-05-11: qty 2.5

## 2015-05-11 MED ORDER — ALPRAZOLAM 0.25 MG PO TABS
0.2500 mg | ORAL_TABLET | Freq: Two times a day (BID) | ORAL | Status: DC | PRN
  Administered 2015-05-11 (×2): 0.25 mg via ORAL
  Filled 2015-05-11 (×2): qty 1

## 2015-05-11 MED ORDER — ALBUTEROL (5 MG/ML) CONTINUOUS INHALATION SOLN
10.0000 mg/h | INHALATION_SOLUTION | RESPIRATORY_TRACT | Status: AC
  Administered 2015-05-11: 10 mg/h via RESPIRATORY_TRACT

## 2015-05-11 MED ORDER — NITROGLYCERIN 0.4 MG SL SUBL: 0.4000 mg | SUBLINGUAL_TABLET | SUBLINGUAL | Status: DC | PRN

## 2015-05-11 NOTE — H&P (Signed)
[facility] History and Physical                                                                                    Jenna Marquez, is a 69 y.o. female  MRN: [id number]   DOB - 1946-03-24  Admit Date - 05/11/2015  Outpatient Primary MD for the patient is Jenna Asa, MD   Pulmonologist: Dr. Elsworth Marquez  Referring Physician:  Dr. Shanon Marquez, EDP  Chief Complaint:   Chief Complaint  Patient presents with  . Shortness of Breath     HPI  Jenna Marquez  is a 69 y.o. female, with COPD and a pace maker for history of complete heart block.  She reports she was in usual state of health until last night 11:30 p.m when she was lying in bed and developed indigestion. She got up and took 3 tums which did not help. She took 2 doses of Pepto-Bismol which still did not relieve her symptoms. The indigestion felt like chest tightness in her central chest. It did not radiate. It was worse with deep breathing. Family she decided that this must be her asthma acting up and she went to [facility].  Jenna Marquez sees Dr. Elsworth Marquez of [facility] pulmonology. Last Friday, 9/23, Dr. Elsworth Marquez changed her medications from Spiriva, Symbicort, and Xopenex to pro-air due to expense. The patient reports she is in the "doughnut hole".  For the last several days she reports coughing up clear liquid in the morning, and she reports having chest palpitations at [facility]. She denies vomiting, fever, cold symptoms, abdominal pain, anorexia, change in bowel habits, dysuria.  At [facility] her venous blood gas showed a pH of 7.4. Labs were reassuring. White count not elevated, creatinine at baseline, point-of-care troponin 0. Chest x-ray did not show any infiltrates or edema. EKG did not show significant changes. She was transferred to [facility] for admission and treatment of a COPD exacerbation   Review of Systems   In addition to the HPI above,  No Fever-chills, No Headache, No changes with Vision or  hearing, No problems swallowing food or Liquids, No Abdominal pain, No Nausea or Vomiting, Bowel movements are regular, No Blood in stool or Urine, No dysuria, No new skin rashes or bruises, No new joints pains-aches,  No new weakness, tingling, numbness in any extremity, No recent weight gain or loss, A full 10 point Review of Systems was done, except as stated above, all other Review of Systems were negative.  Past Medical History  Past Medical History  Diagnosis Date  . COPD (chronic obstructive pulmonary disease)   . Asthma   . Allergic rhinitis   . 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 GERD (gastroesophageal reflux disease)   . Anxiety     "occasionally"    Past Surgical History  Procedure Laterality Date  . Ectopic pregnancy surgery  1974  . Tonsillectomy and adenoidectomy  1954  . Ep implantable device N/A 01/07/2015    Procedure: Pacemaker Implant;  Surgeon: Jenna Lance, MD;  Location: [facility];  Service: Cardiovascular;  Laterality: N/A;  Social History Social History  Substance Use Topics  . Smoking status: Former Smoker -- 1.00 packs/day for 35 years    Types: Cigarettes    Quit date: 08/10/2012  . Smokeless tobacco: Never Used     Comment: Started at age 33  . Alcohol Use: 16.8 oz/week    28 Shots of liquor per week     Comment: 01/06/2015 "2 shot mixed drinks, 2/day"   she stopped smoking for 5 years ago. She reports drinking alcohol a couple of times a week. She lives in her own home and is independent with her ADLs.    Family History Family History  Problem Relation Age of Onset  . Coronary artery disease Mother   . Heart disease Mother     CHF  . Diabetes Neg Hx   . Cancer Neg Hx   . Stroke Neg Hx   . Colon cancer Neg Hx    no history of pulmonary disease in the family. No history of blood clots in the family.  Prior to Admission medications   Medication Sig Start Date End Date Taking? Authorizing Provider   albuterol (PROVENTIL HFA;VENTOLIN HFA) 108 (90 BASE) MCG/ACT inhaler Inhale 2 puffs into the lungs every 6 (six) hours as needed for wheezing or shortness of breath. 05/07/15   Jenna Noel, MD  albuterol (PROVENTIL) (2.5 MG/3ML) 0.083% nebulizer solution 4 times daily; DX: J44.9 05/07/15   Jenna Noel, MD  ALPRAZolam Jenna Marquez) 0.5 MG tablet TAKE 1 TABLET BY MOUTH EVERY NIGHT AT BEDTIME AS NEEDED FOR SLEEP 12/04/14   Jenna Minium, MD  Ascorbic Acid (VITAMIN C GUMMIE PO) Take 1 tablet by mouth daily as needed (for colds).    Historical Provider, MD  budesonide-formoterol (SYMBICORT) 160-4.5 MCG/ACT inhaler Take 2 puffs first thing in am and then another 2 puffs about 12 hours later. 12/24/14   Jenna Stain, MD  Calcium Carb-Cholecalciferol (CALTRATE 600+D SOFT) 600-800 MG-UNIT CHEW Chew 1 tablet by mouth 2 (two) times daily.    Historical Provider, MD  Cholecalciferol (VITAMIN D) 2000 UNITS CAPS Take 1 capsule by mouth daily.    Historical Provider, MD  guaiFENesin-codeine (ROBITUSSIN AC) 100-10 MG/5ML syrup Take 10 mLs by mouth 3 (three) times daily as needed for cough. 08/28/14   Jenna Stain, MD  ibuprofen (ADVIL,MOTRIN) 800 MG tablet TAKE 1 TABLET BY MOUTH EVERY 8 HOURS AS NEEDED 03/20/15   Jenna Minium, MD  ipratropium (ATROVENT) 0.02 % nebulizer solution Take 2.5 mLs (0.5 mg total) by nebulization 4 (four) times daily. DX: J44.9 05/07/15   Jenna Noel, MD  loratadine-pseudoephedrine (CLARITIN-D 24-HOUR) 10-240 MG per 24 hr tablet Take 1 tablet by mouth daily as needed for allergies.    Historical Provider, MD  Multiple Vitamin (MULTIVITAMIN) tablet Take 2 tablets by mouth every morning.     Historical Provider, MD  tiotropium (SPIRIVA) 18 MCG inhalation capsule Place 1 capsule (18 mcg total) into inhaler and inhale daily. 12/24/14   Jenna Stain, MD    Allergies  Allergen Reactions  . Avelox [Moxifloxacin Hcl In Nacl] Itching and Rash    Physical Exam  Vitals  Blood  pressure 155/71, pulse 100, temperature 97.3 F (36.3 C), temperature source Oral, resp. rate 20, height 5' 6.5" (1.689 m), weight 61.916 kg (136 lb 8 oz), SpO2 97 %.   General: Well-developed female sitting on the side of the bed with mildly increased work of breathing and slight body tremor. Son at bedside.  Psych:  Normal affect and insight, Not Suicidal or Homicidal, Awake Alert, Oriented X 3.  Neuro:   No F.N deficits, ALL C.Nerves Intact, Strength 5/5 all 4 extremities, Sensation intact all 4 extremities.  ENT:  Ears and Eyes appear Normal, Conjunctivae clear, PER. Moist oral mucosa without erythema or exudates.  Neck:  Supple, No lymphadenopathy appreciated  Respiratory:  Decreased breath sounds, has difficulty speaking in full sentences. Has pain with deep inspiration.  Cardiac:  Tachycardic, No Murmurs, no LE edema noted, no JVD.    Abdomen:  Positive bowel sounds, Soft, Non tender, Non distended,  No masses appreciated  Skin:  No Cyanosis, Normal Skin Turgor, No Skin Rash or Bruise.  Extremities:  Able to move all 4. 5/5 strength in each,  no effusions.  Data Review  CBC  Recent Labs Lab 05/11/15 0310  WBC 7.9  HGB 13.3  HCT 37.9  PLT 246  MCV 105.9*  MCH 37.2*  MCHC 35.1  RDW 13.2  LYMPHSABS 1.5  MONOABS 0.8  EOSABS 0.1  BASOSABS 0.0    Chemistries   Recent Labs Lab 05/11/15 0310  NA 142  K 4.5  CL 106  CO2 27  GLUCOSE 123*  BUN 15  CREATININE 1.12*  CALCIUM 10.3     Cardiac Enzymes  Recent Labs Lab 05/11/15 0310  TROPONINI <0.03     Urinalysis  Imaging results:   Dg Chest 2 View  05/11/2015   CLINICAL DATA:  Wheezing  EXAM: CHEST  2 VIEW  COMPARISON:  01/08/2015  FINDINGS: Dual-chamber pacer from the left with leads in unchanged position. Normal heart size and mediastinal contours.  Mild atelectasis or scarring at the bases, stable from prior. There is no edema, consolidation, effusion, or pneumothorax. No acute osseous finding.   IMPRESSION: Mild atelectasis or scarring at the bases, similar to 01/08/2015.   Electronically Signed   By: Monte Fantasia M.D.   On: 05/11/2015 05:15    My personal review of EKG: ventricular paced.   Assessment & Plan  Principal Problem:   COPD exacerbation Active Problems:   RBBB (right bundle branch block)   Generalized anxiety disorder   Lumbar radicular pain   Pacemaker   Chest pain on breathing   CKD (chronic kidney disease) stage 3, GFR 30-59 ml/min   COPD exacerbation Possibly made worse by recent change in medications. Patient describes chest tightness. I feel it is important to rule out ACS and PE. Will check d-dimer and cycle troponins.  Otherwise treat as normal COPD exacerbation with Xopenex nebulizers, Solu-Medrol, Mucinex, insulin and Protonix. Discontinue ibuprofen. Case management consultation placed for help with medication.   History of complete heart block status post pacemaker placement Stable. Patient is not on a daily aspirin. We will start this now as I'm slightly concerned for ACS. Please discontinue if troponins remain negative.  A recent echocardiogram ordered by Dr. Dorris Carnes 12/2014 shows LVEF of 65%.   Chronic kidney disease Creatinine is at baseline. We'll give gentle IV fluids.   Consultants Called:  None   Family Communication:   Son at bedside  Code Status:  DO NOT RESUSCITATE-this was confirmed with patient at bedside  Condition:  Guarded  Potential Disposition: To home when breathing improved.  2-3 days.  Time spent in minutes : [address]  PA-C on 05/11/2015 at 12:02 PM Between 7am to 7pm - Pager - [phone] After 7pm go to [email or web address] - password [email or web address] And look for the night coverage person  covering me after hours  [facility]

## 2015-05-11 NOTE — Progress Notes (Signed)
Pt. Refused her blood pressure medication. Pt. Stated she does not have high blood pressure. Pt. Educated. RN will continue to monitor pt. For changes in condition.

## 2015-05-11 NOTE — Progress Notes (Signed)
69 year old female with PMHx sig for COPD and heart block with pacer who presents to [facility] with acute onset SOB.  Being admitted for COPD exacerbation.    Has had Duoneb, steroids and continuous albuterol --> still tight.  Gas okay.

## 2015-05-11 NOTE — ED Notes (Signed)
Was told pt needed another bed,we needed to hold her here (eventhough she was already on [facility] stretcher and was headed  out the door.  She took my # and someone was to call me right back.

## 2015-05-11 NOTE — ED Notes (Signed)
Attempted to give report to nurse.  Couldn't take pt w/7/10 chest pain with deep breathing

## 2015-05-11 NOTE — ED Notes (Signed)
Pt c/o wheezing, coughing and chest tightness since 2330

## 2015-05-11 NOTE — ED Provider Notes (Signed)
CSN: [id number]     Arrival date & time 05/11/15  0257 History   First MD Initiated Contact with Patient 05/11/15 0301     Chief Complaint  Patient presents with  . Shortness of Breath     (Consider location/radiation/quality/duration/timing/severity/associated sxs/prior Treatment) HPI  This is a 69 year old female with a history of COPD. She is here with shortness of breath, chest tightness and wheezing since about 11:30 PM yesterday evening. She awoke with these symptoms. She had associated discomfort which she describes as "indigestion" which resolved after taking toms and Pepto-Bismol. The chest tightness and shortness of breath persists. She used her Spriva (but not albuterol) without relief. Symptoms are moderate to severe and worse with exertion or lying supine. She has had occasional cough with this.  Past Medical History  Diagnosis Date  . COPD (chronic obstructive pulmonary disease)   . Asthma   . Allergic rhinitis   . 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 GERD (gastroesophageal reflux disease)   . Anxiety     "occasionally"   Past Surgical History  Procedure Laterality Date  . Ectopic pregnancy surgery  1974  . Tonsillectomy and adenoidectomy  1954  . Ep implantable device N/A 01/07/2015    Procedure: Pacemaker Implant;  Surgeon: Evans Lance, MD;  Location: [facility];  Service: Cardiovascular;  Laterality: N/A;   Family History  Problem Relation Age of Onset  . Coronary artery disease Mother   . Heart disease Mother     CHF  . Diabetes Neg Hx   . Cancer Neg Hx   . Stroke Neg Hx   . Colon cancer Neg Hx    Social History  Substance Use Topics  . Smoking status: Former Smoker -- 1.00 packs/day for 35 years    Types: Cigarettes    Quit date: 08/10/2012  . Smokeless tobacco: Never Used     Comment: Started at age 71  . Alcohol Use: 16.8 oz/week    28 Shots of liquor per week     Comment: 01/06/2015 "2 shot mixed drinks, 2/day"   OB  History    No data available     Review of Systems  All other systems reviewed and are negative.   Allergies  Avelox  Home Medications   Prior to Admission medications   Medication Sig Start Date End Date Taking? Authorizing Provider  albuterol (PROVENTIL HFA;VENTOLIN HFA) 108 (90 BASE) MCG/ACT inhaler Inhale 2 puffs into the lungs every 6 (six) hours as needed for wheezing or shortness of breath. 05/07/15   Rigoberto Noel, MD  albuterol (PROVENTIL) (2.5 MG/3ML) 0.083% nebulizer solution 4 times daily; DX: J44.9 05/07/15   Rigoberto Noel, MD  ALPRAZolam Duanne Moron) 0.5 MG tablet TAKE 1 TABLET BY MOUTH EVERY NIGHT AT BEDTIME AS NEEDED FOR SLEEP 12/04/14   Midge Minium, MD  Ascorbic Acid (VITAMIN C GUMMIE PO) Take 1 tablet by mouth daily as needed (for colds).    Historical Provider, MD  budesonide-formoterol (SYMBICORT) 160-4.5 MCG/ACT inhaler Take 2 puffs first thing in am and then another 2 puffs about 12 hours later. 12/24/14   Elsie Stain, MD  Calcium Carb-Cholecalciferol (CALTRATE 600+D SOFT) 600-800 MG-UNIT CHEW Chew 1 tablet by mouth 2 (two) times daily.    Historical Provider, MD  Cholecalciferol (VITAMIN D) 2000 UNITS CAPS Take 1 capsule by mouth daily.    Historical Provider, MD  guaiFENesin-codeine (ROBITUSSIN AC) 100-10 MG/5ML syrup Take 10 mLs by mouth 3 (  three) times daily as needed for cough. 08/28/14   Elsie Stain, MD  ibuprofen (ADVIL,MOTRIN) 800 MG tablet TAKE 1 TABLET BY MOUTH EVERY 8 HOURS AS NEEDED 03/20/15   Midge Minium, MD  ipratropium (ATROVENT) 0.02 % nebulizer solution Take 2.5 mLs (0.5 mg total) by nebulization 4 (four) times daily. DX: J44.9 05/07/15   Rigoberto Noel, MD  loratadine-pseudoephedrine (CLARITIN-D 24-HOUR) 10-240 MG per 24 hr tablet Take 1 tablet by mouth daily as needed for allergies.    Historical Provider, MD  Multiple Vitamin (MULTIVITAMIN) tablet Take 2 tablets by mouth every morning.     Historical Provider, MD  tiotropium (SPIRIVA) 18  MCG inhalation capsule Place 1 capsule (18 mcg total) into inhaler and inhale daily. 12/24/14   Elsie Stain, MD   BP 145/45 mmHg  Pulse 109  Temp(Src) 97.3 F (36.3 C) (Oral)  Resp 24  Ht 5' 6.5" (1.689 m)  Wt 136 lb (61.689 kg)  BMI 21.62 kg/m2  SpO2 99%   Physical Exam  General: Well-developed, well-nourished female; appearance consistent with age of record HENT: normocephalic; atraumatic Eyes: pupils equal, round and reactive to light; extraocular muscles intact Neck: supple Heart: regular rate and rhythm; distant sounds Lungs: Decreased air movement bilaterally; tachypnea; faint wheezes on deep inspiration Abdomen: soft; nondistended; nontender; no masses or hepatosplenomegaly; bowel sounds present Extremities: No deformity; full range of motion; pulses normal Neurologic: Awake, alert and oriented; motor function intact in all extremities and symmetric; no facial droop Skin: Warm and dry Psychiatric: Anxious    ED Course  Procedures (including critical care time)  CRITICAL CARE Performed by: MOLPUS,JOHN L Total critical care time: 30 minutes Critical care time was exclusive of separately billable procedures and treating other patients. Critical care was necessary to treat or prevent imminent or life-threatening deterioration. Critical care was time spent personally by me on the following activities: development of treatment plan with patient and/or surrogate as well as nursing, discussions with consultants, evaluation of patient's response to treatment, examination of patient, obtaining history from patient or surrogate, ordering and performing treatments and interventions, ordering and review of laboratory studies, ordering and review of radiographic studies, pulse oximetry and re-evaluation of patient's condition.  MDM    EKG Interpretation  Date/Time:  Monday May 11 2015 03:10:44 EDT Ventricular Rate:  96 PR Interval:  168 QRS Duration: 166 QT  Interval:  420 QTC Calculation: 530 R Axis:   -48 Text Interpretation:  Atrial-sensed ventricular-paced rhythm No significant change was found Confirmed by MOLPUS  MD, Jenny Reichmann ([id number]) on 05/11/2015 3:14:46 AM      Nursing notes and vitals signs, including pulse oximetry, reviewed.  Summary of this visit's results, reviewed by myself:  Labs:  Results for orders placed or performed during the hospital encounter of 05/11/15 (from the past 24 hour(s))  Basic metabolic panel     Status: Abnormal   Collection Time: 05/11/15  3:10 AM  Result Value Ref Range   Sodium 142 135 - 145 mmol/L   Potassium 4.5 3.5 - 5.1 mmol/L   Chloride 106 101 - 111 mmol/L   CO2 27 22 - 32 mmol/L   Glucose, Bld 123 (H) 65 - 99 mg/dL   BUN 15 6 - 20 mg/dL   Creatinine, Ser 1.12 (H) 0.44 - 1.00 mg/dL   Calcium 10.3 8.9 - 10.3 mg/dL   GFR calc non Af Amer 48 (L) >60 mL/min   GFR calc Af Amer 56 (L) >60 mL/min   Anion  gap 9 5 - 15  CBC with Differential     Status: Abnormal   Collection Time: 05/11/15  3:10 AM  Result Value Ref Range   WBC 7.9 4.0 - 10.5 K/uL   RBC 3.58 (L) 3.87 - 5.11 MIL/uL   Hemoglobin 13.3 12.0 - 15.0 g/dL   HCT 37.9 36.0 - 46.0 %   MCV 105.9 (H) 78.0 - 100.0 fL   MCH 37.2 (H) 26.0 - 34.0 pg   MCHC 35.1 30.0 - 36.0 g/dL   RDW 13.2 11.5 - 15.5 %   Platelets 246 150 - 400 K/uL   Neutrophils Relative % 70 %   Neutro Abs 5.5 1.7 - 7.7 K/uL   Lymphocytes Relative 19 %   Lymphs Abs 1.5 0.7 - 4.0 K/uL   Monocytes Relative 10 %   Monocytes Absolute 0.8 0.1 - 1.0 K/uL   Eosinophils Relative 1 %   Eosinophils Absolute 0.1 0.0 - 0.7 K/uL   Basophils Relative 0 %   Basophils Absolute 0.0 0.0 - 0.1 K/uL  Troponin I     Status: None   Collection Time: 05/11/15  3:10 AM  Result Value Ref Range   Troponin I <0.03 <0.031 ng/mL  I-Stat venous blood gas, ED     Status: Abnormal   Collection Time: 05/11/15  5:53 AM  Result Value Ref Range   pH, Ven 7.403 (H) 7.250 - 7.300   pCO2, Ven 49.7 45.0 -  50.0 mmHg   pO2, Ven 31.0 30.0 - 45.0 mmHg   Bicarbonate 31.0 (H) 20.0 - 24.0 mEq/L   TCO2 32 0 - 100 mmol/L   O2 Saturation 59.0 %   Acid-Base Excess 5.0 (H) 0.0 - 2.0 mmol/L   Patient temperature 98.6 F    Collection site IV START    Drawn by Nurse    Sample type VENOUS    Comment NOTIFIED PHYSICIAN     Imaging Studies: Dg Chest 2 View  05/11/2015   CLINICAL DATA:  Wheezing  EXAM: CHEST  2 VIEW  COMPARISON:  01/08/2015  FINDINGS: Dual-chamber pacer from the left with leads in unchanged position. Normal heart size and mediastinal contours.  Mild atelectasis or scarring at the bases, stable from prior. There is no edema, consolidation, effusion, or pneumothorax. No acute osseous finding.  IMPRESSION: Mild atelectasis or scarring at the bases, similar to 01/08/2015.   Electronically Signed   By: Monte Fantasia M.D.   On: 05/11/2015 05:15    4:52 AM Air movement still poor after initial albuterol and Atrovent neb treatment. We'll start at 10 milligrams 1 hour continuous albuterol treatment. Solu-Medrol 60 milligrams IV ordered.  5:59 AM Air movement slightly improved after continuous neb. Faint wheezing heard. Occasional ectopy noted on the monitor.  6:12 AM Dr. Loleta Books accepts for transfer to [facility] for admission.   Shanon Rosser, MD 05/11/15 [phone]

## 2015-05-11 NOTE — Progress Notes (Signed)
Patient requested that we check her liver.  She is concerned as she has been drinking alcohol.  LFTs and Serum alcohol level ordered.  Imogene Burn, PA-C [facility] Pager: [phone]

## 2015-05-11 NOTE — Progress Notes (Signed)
Pt arrived to the unit via [facility] at 0930; pt alert and verbally responsive; oriented to the unit and room; VSS, telemetry applied and verified; fall prevention and education done with pt; packet handed to pt as well. Pt c/o chest tightness of 3/10 pain, said its more pressure; No order received yet; MD paged and notified, awaiting on new orders. Will closely monitor pt closely. Francis Gaines Alita Waldren RN.

## 2015-05-11 NOTE — ED Notes (Signed)
Pt discarged w/[facility]

## 2015-05-11 NOTE — ED Notes (Signed)
Pt has decided to go home.. She didn't want to stay earlier but night nurse and edp  convenced her to stay.  Now she tells me she doesn't want to go to a step down bed, she will go home.  Tried to talk with her and she doesn't want them to tell her she can't eat or have a pint of alcohol if she wants it.

## 2015-05-12 LAB — BASIC METABOLIC PANEL
Anion gap: 10 (ref 5–15)
BUN: 13 mg/dL (ref 6–20)
CHLORIDE: 105 mmol/L (ref 101–111)
CO2: 23 mmol/L (ref 22–32)
CREATININE: 1.04 mg/dL — AB (ref 0.44–1.00)
Calcium: 9.2 mg/dL (ref 8.9–10.3)
GFR calc Af Amer: >60 mL/min (ref >60)
GFR calc non Af Amer: 53 mL/min — ABNORMAL LOW (ref >60)
GLUCOSE: 156 mg/dL — AB (ref 65–99)
POTASSIUM: 3.9 mmol/L (ref 3.5–5.1)
SODIUM: 138 mmol/L (ref 135–145)

## 2015-05-12 LAB — HEPATIC FUNCTION PANEL
ALK PHOS: 82 U/L (ref 38–126)
ALT: 31 U/L (ref 14–54)
AST: 42 U/L — ABNORMAL HIGH (ref 15–41)
Albumin: 3 g/dL — ABNORMAL LOW (ref 3.5–5.0)
BILIRUBIN DIRECT: 0.2 mg/dL (ref 0.1–0.5)
BILIRUBIN INDIRECT: 0.7 mg/dL (ref 0.3–0.9)
BILIRUBIN TOTAL: 0.9 mg/dL (ref 0.3–1.2)
TOTAL PROTEIN: 6 g/dL — AB (ref 6.5–8.1)

## 2015-05-12 LAB — CBC
HCT: 36.7 % (ref 36.0–46.0)
HEMOGLOBIN: 12.4 g/dL (ref 12.0–15.0)
MCH: 37.1 pg — AB (ref 26.0–34.0)
MCHC: 33.8 g/dL (ref 30.0–36.0)
MCV: 109.9 fL — AB (ref 78.0–100.0)
Platelets: 221 10*3/uL (ref 150–400)
RBC: 3.34 MIL/uL — ABNORMAL LOW (ref 3.87–5.11)
RDW: 14.2 % (ref 11.5–15.5)
WBC: 9.5 10*3/uL (ref 4.0–10.5)

## 2015-05-12 LAB — GLUCOSE, CAPILLARY
GLUCOSE-CAPILLARY: 126 mg/dL — AB (ref 65–99)
GLUCOSE-CAPILLARY: 118 mg/dL — AB (ref 65–99)

## 2015-05-12 MED ORDER — PANTOPRAZOLE SODIUM 40 MG PO TBEC
40.0000 mg | DELAYED_RELEASE_TABLET | Freq: Every day | ORAL | Status: DC
  Administered 2015-05-12: 40 mg via ORAL
  Filled 2015-05-12: qty 1

## 2015-05-12 MED ORDER — ASPIRIN 325 MG PO TBEC: 325.0000 mg | DELAYED_RELEASE_TABLET | Freq: Every day | ORAL | Status: DC

## 2015-05-12 NOTE — Progress Notes (Signed)
Pt discharge education and instructions completed with pt and family at beside; all voices understanding and denies any questions. Pt IV and telemetry removed; pt discharge home with family to transport her home. Pt transported off unit via wheelchair with belongings and family at side. Francis Gaines Kuffour RN.

## 2015-05-12 NOTE — Progress Notes (Signed)
CM talked to patient about DCP; patient is independent of her ADL's, has nebulizer machine at home, no walker or cane needed; Private insurance with [employer] with prescription drug coverage - no problem getting her medication; Pharmacy of choice is [facility]. Patient stated that at home she does not have any issues with medication, only when she comes to the hospital "they mess up me medicine."No needs identified. Mindi Slicker [facility] [phone]

## 2015-05-12 NOTE — Discharge Summary (Addendum)
Physician Discharge Summary  Jenna Marquez FMB:[id number] DOB: July 24, 1946 DOA: 05/11/2015  PCP: Jenna Asa, MD  Admit date: 05/11/2015 Discharge date: 05/12/2015  Time spent: 45 minutes  Recommendations for Outpatient Follow-up:  Patient will be discharged to home.  Patient will need to follow up with primary care provider within one week of discharge.  Follow with Dr. Elsworth Marquez, pulmonologist, as needed. Patient should continue medications as prescribed.  Patient should follow a regular diet.  Resume activity as tolerated.  Discharge Diagnoses:  Principal Problem:   COPD exacerbation Active Problems:   Generalized anxiety disorder   Pacemaker   Chest pain on breathing   CKD (chronic kidney disease) stage 3, GFR 30-59 ml/min  Discharge Condition: Stable  Diet recommendation: Regular  Filed Weights   05/11/15 0330 05/11/15 0932 05/12/15 0555  Weight: 61.689 kg (136 lb) 61.916 kg (136 lb 8 oz) 63.231 kg (139 lb 6.4 oz)    History of present illness:  On 05/11/2015 by Ms. Jenna Burn, PA/Dr. Leisa Marquez Jenna Marquez is a 69 y.o. female, with COPD and a pace maker for history of complete heart block. She reports she was in usual state of health until last night 11:30 p.m when she was lying in bed and developed indigestion. She got up and took 3 tums which did not help. She took 2 doses of Pepto-Bismol which still did not relieve her symptoms. The indigestion felt like chest tightness in her central chest. It did not radiate. It was worse with deep breathing. Family she decided that this must be her asthma acting up and she went to [facility] [place]. Jenna Marquez sees Dr. Elsworth Marquez of [facility] pulmonology. Last Friday, 9/23, Dr. Elsworth Marquez changed her medications from Spiriva, Symbicort, and Xopenex to pro-air due to expense. The patient reports she is in the "doughnut hole". For the last several days she reports coughing up clear liquid in the morning, and she reports having chest palpitations  at [facility] [place]. She denies vomiting, fever, cold symptoms, abdominal pain, anorexia, change in bowel habits, dysuria.  At [facility] [place] her venous blood gas showed a pH of 7.4. Labs were reassuring. White count not elevated, creatinine at baseline, point-of-care troponin 0. Chest x-ray did not show any infiltrates or edema. EKG did not show significant changes. She was transferred to [facility] for admission and treatment of a COPD exacerbation  Hospital Course:  COPD exacerbation/Chest tightness -Possibly made worse by recent change in medications. -CXR: Mild atelectasis or scarring at the bases, similar to x-ray 01/08/2015 -Patient states she is feeling better today and would like to be discharged.  No longer complains of chest tightness.  Did not want to take aspirin. -Troponins cycled and found to be negative -Ddimer elevated, 0.58, howvever, patient wishes to be discharged.  Discussed obtaining CT chest to rule out PE, she declined. -Reviewed chart with Dr. Elsworth Marquez, continue home medications  History of complete heart block status post pacemaker placement -Stable.  -Patient is not on a daily aspirin.  -Echocardiogram ordered by Dr. Dorris Marquez 12/2014 shows LVEF of 65%.  Chronic kidney disease, stage 3 -Creatinine is at baseline.  Generalized Anxiety -Continue xanax  Procedures: None  Consultations: Pulmonology, Dr. Elsworth Marquez via phone  Discharge Exam: Filed Vitals:   05/12/15 0555  BP: 164/82  Pulse: 106  Temp: 97.8 F (36.6 C)  Resp: 18     General: Well developed, well nourished, NAD  HEENT: NCAT,  mucous membranes moist.  Cardiovascular: S1 S2 auscultated,  no rubs, murmurs or gallops. Regular rate and rhythm.  Respiratory: Decreased breath sounds, mild exp wheezing, speaking in full sentences with no interruptions or shortness of breath   Abdomen: Soft, nontender, nondistended, + bowel sounds  Extremities: warm dry without cyanosis clubbing or  edema  Neuro: AAOx3, nonfocal  Psych: Anxious   Discharge Instructions      Discharge Instructions    Discharge instructions    Complete by:  As directed   Patient will be discharged to home.  Patient will need to follow up with primary care provider within one week of discharge.  Follow with Dr. Elsworth Marquez, pulmonologist, as needed. Patient should continue medications as prescribed.  Patient should follow a regular diet.  Resume activity as tolerated.            Medication List    STOP taking these medications        budesonide-formoterol 160-4.5 MCG/ACT inhaler  Commonly known as:  SYMBICORT     tiotropium 18 MCG inhalation capsule  Commonly known as:  SPIRIVA      TAKE these medications        PROAIR RESPICLICK 559 (90 BASE) MCG/ACT Aepb  Generic drug:  Albuterol Sulfate  Inhale 2 puffs into the lungs every 6 (six) hours as needed.     albuterol (2.5 MG/3ML) 0.083% nebulizer solution  Commonly known as:  PROVENTIL  4 times daily; DX: J44.9     albuterol 108 (90 BASE) MCG/ACT inhaler  Commonly known as:  PROVENTIL HFA;VENTOLIN HFA  Inhale 2 puffs into the lungs every 6 (six) hours as needed for wheezing or shortness of breath.     ipratropium 0.02 % nebulizer solution  Commonly known as:  ATROVENT  Take 2.5 mLs (0.5 mg total) by nebulization 4 (four) times daily. DX: J44.9     multivitamin tablet  Take 2 tablets by mouth every morning.     VITAMIN C GUMMIE PO  Take 1 tablet by mouth daily as needed (for colds).     Vitamin D 2000 UNITS Caps  Take 1 capsule by mouth daily.       Allergies  Allergen Reactions  . Avelox [Moxifloxacin Hcl In Nacl] Itching and Rash   Follow-up Information    Follow up with Jenna Asa, MD. Schedule an appointment as soon as possible for a visit in 1 week.   Specialty:  Family Medicine   Why:  Hospital follow up   Contact information:   [address] [place] [address] [phone]       Schedule an  appointment as soon as possible for a visit with Jenna Marquez., MD.   Specialty:  Pulmonary Disease   Why:  As needed   Contact information:   [address] [address] [phone]        The results of significant diagnostics from this hospitalization (including imaging, microbiology, ancillary and laboratory) are listed below for reference.    Significant Diagnostic Studies: Dg Chest 2 View  05/11/2015   CLINICAL DATA:  Wheezing  EXAM: CHEST  2 VIEW  COMPARISON:  01/08/2015  FINDINGS: Dual-chamber pacer from the left with leads in unchanged position. Normal heart size and mediastinal contours.  Mild atelectasis or scarring at the bases, stable from prior. There is no edema, consolidation, effusion, or pneumothorax. No acute osseous finding.  IMPRESSION: Mild atelectasis or scarring at the bases, similar to 01/08/2015.   Electronically Signed   By: Neva Seat.D.  On: 05/11/2015 05:15    Microbiology: No results found for this or any previous visit (from the past 240 hour(s)).   Labs: Basic Metabolic Panel:  Recent Labs Lab 05/11/15 0310 05/12/15 0308  NA 142 138  K 4.5 3.9  CL 106 105  CO2 27 23  GLUCOSE 123* 156*  BUN 15 13  CREATININE 1.12* 1.04*  CALCIUM 10.3 9.2   Liver Function Tests:  Recent Labs Lab 05/12/15 0308  AST 42*  ALT 31  ALKPHOS 82  BILITOT 0.9  PROT 6.0*  ALBUMIN 3.0*   No results for input(s): LIPASE, AMYLASE in the last 168 hours. No results for input(s): AMMONIA in the last 168 hours. CBC:  Recent Labs Lab 05/11/15 0310 05/12/15 0308  WBC 7.9 9.5  NEUTROABS 5.5  --   HGB 13.3 12.4  HCT 37.9 36.7  MCV 105.9* 109.9*  PLT 246 221   Cardiac Enzymes:  Recent Labs Lab 05/11/15 0310 05/11/15 1159  TROPONINI <0.03 <0.03   BNP: BNP (last 3 results) No results for input(s): BNP in the last 8760 hours.  ProBNP (last 3 results) No results for input(s): PROBNP in the last 8760 hours.  CBG:  Recent Labs Lab  05/11/15 1638 05/11/15 2107 05/12/15 0550 05/12/15 0751  GLUCAP 240* 179* 126* 118*       Signed:  Cristal Ford  [facility] 05/12/2015, 10:25 AM

## 2015-05-12 NOTE — Discharge Instructions (Signed)

## 2015-05-12 NOTE — Progress Notes (Signed)
PT Cancellation Note  Patient Details Name: Jenna Marquez MRN: [id number] DOB: July 23, 1946   Cancelled Treatment:    Reason Eval/Treat Not Completed: PT screened, no needs identified, will sign off (pt ordered for D/C pt reports no P.T. need and declines eval. Will sign off.)   Melford Aase 05/12/2015, 11:31 AM Elwyn Reach, [place]

## 2015-05-13 ENCOUNTER — Telehealth: Payer: Self-pay | Admitting: *Deleted

## 2015-05-13 NOTE — Telephone Encounter (Signed)
Transition Care Management Follow-up Telephone Call   Date discharged? 05/12/15   How have you been since you were released from the hospital? "I've been fine, Dr. Elsworth Soho changed my medication"    Do you understand why you were in the hospital? Yes "my tubes in my lungs were not opening up enough to breathe"    Do you understand the discharge instructions? yes   Where were you discharged to? Home   Items Reviewed:  Medications reviewed: yes  Allergies reviewed: yes  Dietary changes reviewed: yes  Referrals reviewed: yes   Functional Questionnaire:   Activities of Daily Living (ADLs):   She states they are independent in the following: ambulation, bathing and hygiene, feeding, continence, grooming, toileting and dressing States they require assistance with the following: none   Any transportation issues/concerns?: no   Any patient concerns? no   Confirmed importance and date/time of follow-up visits scheduled yes 05/21/15  Provider Appointment booked with Dr. Birdie Riddle   Confirmed with patient if condition begins to worsen call PCP or go to the ER.  Patient was given the office number and encouraged to call back with question or concerns.  : yes

## 2015-05-14 ENCOUNTER — Other Ambulatory Visit: Payer: Self-pay | Admitting: Family Medicine

## 2015-05-14 ENCOUNTER — Encounter: Payer: Self-pay | Admitting: Adult Health

## 2015-05-14 ENCOUNTER — Ambulatory Visit (INDEPENDENT_AMBULATORY_CARE_PROVIDER_SITE_OTHER): Payer: Medicare HMO | Admitting: Adult Health

## 2015-05-14 ENCOUNTER — Ambulatory Visit: Payer: Medicare HMO | Admitting: Family Medicine

## 2015-05-14 VITALS — BP 134/69 | HR 78 | Temp 97.8°F | Ht 66.5 in | Wt 138.0 lb

## 2015-05-14 DIAGNOSIS — J441 Chronic obstructive pulmonary disease with (acute) exacerbation: Secondary | ICD-10-CM | POA: Diagnosis not present

## 2015-05-14 DIAGNOSIS — Z1231 Encounter for screening mammogram for malignant neoplasm of breast: Secondary | ICD-10-CM

## 2015-05-14 NOTE — Assessment & Plan Note (Signed)
Recent flare now resolving  Will cont Atrovent/albuterol nebs for now as unable to afford symbicort/spiriva   Plan  Continue on Albuterol /Iprtropium Neb Four times a day   Stop Vapor e cigs .  Follow up with Dr. Elsworth Soho  In 3 month and .As needed   Please contact office for sooner follow up if symptoms do not improve or worsen or seek emergency care

## 2015-05-14 NOTE — Patient Instructions (Addendum)
Continue on Albuterol /Iprtropium Neb Four times a day   Stop Vapor e cigs .  Follow up with Dr. Elsworth Soho  In 3 month and .As needed   Please contact office for sooner follow up if symptoms do not improve or worsen or seek emergency care

## 2015-05-14 NOTE — Progress Notes (Signed)
Reviewed & agree with plan  

## 2015-05-14 NOTE — Progress Notes (Signed)
   Subjective:    Patient ID: Jenna Marquez, female    DOB: 07-23-1946, 69 y.o.   MRN: [id number]  HPI 69 yo female with COPD -GOLD 3 , former smoker .   Significant tests/ events  09/2010 FeV1 49% Fef 25 75 22%   Ct chest 07/2012 - no nodules  05/14/2015 Post Hospital follow up : COPD  Pt returns for post hospital follow up .  She was admitted 9/26 to 9/27 for COPD flare . She has some indigestion and increased dyspnea on PTA.  Card enzymes were neg. D Dimer slightly up but she refused CTA Chest .  She is upset with her hospital stay w/ multiple complaints. Support provided  Using e cigs. Discussed cessation  Is now on Atrovent /albuterol neb Four times a day  , unable to afford  Inhalers as she is in the doughnut hole.  Denies chest pain, orthopnea, edema, fever.     Review of Systems Constitutional:   No  weight loss, night sweats,  Fevers, chills,  +fatigue, or  lassitude.  HEENT:   No headaches,  Difficulty swallowing,  Tooth/dental problems, or  Sore throat,                No sneezing, itching, ear ache, nasal congestion, post nasal drip,   CV:  No chest pain,  Orthopnea, PND, swelling in lower extremities, anasarca, dizziness, palpitations, syncope.   GI  No heartburn, indigestion, abdominal pain, nausea, vomiting, diarrhea, change in bowel habits, loss of appetite, bloody stools.   Resp:    No chest wall deformity  Skin: no rash or lesions.  GU: no dysuria, change in color of urine, no urgency or frequency.  No flank pain, no hematuria   MS:  No joint pain or swelling.  No decreased range of motion.  No back pain.  Psych:  No change in mood or affect. No depression or anxiety.  No memory loss.         Objective:   Physical Exam GEN: A/Ox3; pleasant , NAD, chronically ill appearing , anxious   HEENT:  [place]/AT,  EACs-clear, TMs-wnl, NOSE-clear, THROAT-clear, no lesions, no postnasal drip or exudate noted.   NECK:  Supple w/ fair ROM; no JVD; normal carotid  impulses w/o bruits; no thyromegaly or nodules palpated; no lymphadenopathy.  RESP  Decreased BS in bases , no accessory muscle use, no dullness to percussion  CARD:  RRR, no m/r/g  , no peripheral edema, pulses intact, no cyanosis or clubbing.  GI:   Soft & nt; nml bowel sounds; no organomegaly or masses detected.  Musco: Warm bil, no deformities or joint swelling noted.   Neuro: alert, no focal deficits noted.    Skin: Warm, no lesions or rashes         Assessment & Plan:

## 2015-05-15 ENCOUNTER — Other Ambulatory Visit: Payer: Self-pay | Admitting: Family Medicine

## 2015-05-15 DIAGNOSIS — Z780 Asymptomatic menopausal state: Secondary | ICD-10-CM

## 2015-05-21 ENCOUNTER — Ambulatory Visit: Payer: Medicare HMO | Admitting: Family Medicine

## 2015-05-22 ENCOUNTER — Encounter: Payer: Self-pay | Admitting: Family Medicine

## 2015-05-22 ENCOUNTER — Ambulatory Visit (INDEPENDENT_AMBULATORY_CARE_PROVIDER_SITE_OTHER): Payer: Medicare HMO | Admitting: Family Medicine

## 2015-05-22 VITALS — BP 132/70 | HR 94 | Temp 97.5°F | Ht 66.5 in | Wt 136.8 lb

## 2015-05-22 DIAGNOSIS — M7989 Other specified soft tissue disorders: Secondary | ICD-10-CM | POA: Diagnosis not present

## 2015-05-22 DIAGNOSIS — M25512 Pain in left shoulder: Secondary | ICD-10-CM

## 2015-05-22 HISTORY — DX: Pain in left shoulder: M25.512

## 2015-05-22 HISTORY — DX: Other specified soft tissue disorders: M79.89

## 2015-05-22 NOTE — Progress Notes (Signed)
   Subjective:    Patient ID: Jenna Marquez, female    DOB: 1946-02-06, 69 y.o.   MRN: [id number]  HPI R arm pain/swelling- pt was hospitalized 9/26-27 and had IV placed in R antecubital fossa.  This IV site was not used.  Pt noted some arm soreness the day of d/c.  Now w/ swelling at site- no overlying redness or warmth.  No palpable cord.  Knot on L upper arm- mildly tender, pt noted after defibrillator placement in May.  No known injury.  Review of Systems For ROS see HPI     Objective:   Physical Exam  Constitutional: She is oriented to person, place, and time. She appears well-developed and well-nourished. No distress.  HENT:  Head: Normocephalic and atraumatic.  Eyes: Conjunctivae and EOM are normal. Pupils are equal, round, and reactive to light.  Cardiovascular: Intact distal pulses.   Musculoskeletal: She exhibits edema (soft swelling over R antecubital fossa w/o overlying erythema or warmth.  no palpable cord, no TTP) and tenderness (TTP over L upper arm/bicep w/ palpable area of firmness that pt is concerned about).  Neurological: She is alert and oriented to person, place, and time.  Skin: Skin is warm and dry. No erythema.  Vitals reviewed.         Assessment & Plan:

## 2015-05-22 NOTE — Patient Instructions (Signed)
You have an ultrasound appt tomorrow at 2:00 downstairs We'll call you with your ortho appt for the L arm/shoulder Alternate heat/ice on the swollen area for pain and swelling relief Call with any questions or concerns Hang in there!!!

## 2015-05-22 NOTE — Progress Notes (Signed)
Pre visit review using our clinic review tool, if applicable. No additional management support is needed unless otherwise documented below in the visit note. 

## 2015-05-23 ENCOUNTER — Ambulatory Visit (HOSPITAL_BASED_OUTPATIENT_CLINIC_OR_DEPARTMENT_OTHER): Admission: RE | Admit: 2015-05-23 | Payer: Medicare HMO | Source: Ambulatory Visit

## 2015-05-24 ENCOUNTER — Ambulatory Visit (HOSPITAL_BASED_OUTPATIENT_CLINIC_OR_DEPARTMENT_OTHER)
Admission: RE | Admit: 2015-05-24 | Discharge: 2015-05-24 | Disposition: A | Discharge status: Home / Self Care | Payer: Medicare HMO | Source: Ambulatory Visit | Attending: Family Medicine | Admitting: Family Medicine

## 2015-05-24 DIAGNOSIS — M7989 Other specified soft tissue disorders: Secondary | ICD-10-CM | POA: Insufficient documentation

## 2015-05-24 DIAGNOSIS — I82611 Acute embolism and thrombosis of superficial veins of right upper extremity: Secondary | ICD-10-CM | POA: Insufficient documentation

## 2015-05-24 DIAGNOSIS — Z950 Presence of cardiac pacemaker: Secondary | ICD-10-CM | POA: Diagnosis not present

## 2015-05-24 NOTE — Assessment & Plan Note (Signed)
New.  Area appears more consistent w/ infiltration/hematoma rather than DVT but due to presence of IV site, will get [place] to assess.  Reviewed supportive care and red flags that should prompt return.  Pt expressed understanding and is in agreement w/ plan.

## 2015-05-24 NOTE — Assessment & Plan Note (Signed)
New.  Pt reports L upper arm pain and L shoulder pain that will awaken her at night.  Unclear whether the lump she is feeling over her bicep is abnormal (she has a similar one on the R side but that side is not tender).  Given her shoulder pain and possible L upper arm abnormality, will refer to ortho.  Pt expressed understanding and is in agreement w/ plan.

## 2015-05-25 ENCOUNTER — Telehealth: Payer: Self-pay | Admitting: Family Medicine

## 2015-05-25 NOTE — Telephone Encounter (Signed)
Relation to YO:FVWA Call back [phone]   Reason for call:  atient requesting a referral to Marciano Sequin, MD [facility] 06/04/15 at 9:15am

## 2015-05-26 NOTE — Telephone Encounter (Signed)
Please advise 

## 2015-05-26 NOTE — Telephone Encounter (Signed)
Pt would prefer to see Dr. Mee Hives for Ortho (located in [place] at [address] of [address] referral due to arm pain instead of [facility].

## 2015-05-26 NOTE — Telephone Encounter (Signed)
Pt self scheduled, records faxed

## 2015-05-26 NOTE — Telephone Encounter (Signed)
What type of Dr is Dr Mee Hives and for what condition does she need to be seen?

## 2015-05-27 ENCOUNTER — Telehealth: Payer: Self-pay | Admitting: Family Medicine

## 2015-05-27 NOTE — Telephone Encounter (Signed)
Called pt and informed that the warning she is getting a warning that there were 3 of the same medications in regards to her albuterol on her AVS written 3 different ways. Pt was also upset about her ambulatory referral to orthopedic surgery. Pt was advised that we are an ambulatory office and she has to go to the surgeons to figure out what is causing her shoulder pain. Pt stated an understanding.

## 2015-05-27 NOTE — Telephone Encounter (Signed)
Caller name:Dawnisha Relationship to patient:SELF Can be [phone] Pharmacy:  Reason for call:PLEASE CALL THE PATIENT SHE IS VERY UPSET OVER HER AFTER VISIT SUMMARY FROM HER LAST VISIT

## 2015-05-28 ENCOUNTER — Encounter: Payer: Self-pay | Admitting: Adult Health

## 2015-05-28 ENCOUNTER — Ambulatory Visit (HOSPITAL_BASED_OUTPATIENT_CLINIC_OR_DEPARTMENT_OTHER)
Admission: RE | Admit: 2015-05-28 | Discharge: 2015-05-28 | Disposition: A | Discharge status: Home / Self Care | Payer: Medicare HMO | Source: Ambulatory Visit | Attending: Adult Health | Admitting: Adult Health

## 2015-05-28 ENCOUNTER — Ambulatory Visit (INDEPENDENT_AMBULATORY_CARE_PROVIDER_SITE_OTHER): Payer: Medicare HMO | Admitting: Adult Health

## 2015-05-28 VITALS — BP 110/67 | HR 99 | Temp 97.4°F | Ht 66.5 in | Wt 137.0 lb

## 2015-05-28 DIAGNOSIS — J449 Chronic obstructive pulmonary disease, unspecified: Secondary | ICD-10-CM | POA: Insufficient documentation

## 2015-05-28 DIAGNOSIS — R0989 Other specified symptoms and signs involving the circulatory and respiratory systems: Secondary | ICD-10-CM

## 2015-05-28 DIAGNOSIS — R05 Cough: Secondary | ICD-10-CM

## 2015-05-28 DIAGNOSIS — Z87891 Personal history of nicotine dependence: Secondary | ICD-10-CM | POA: Insufficient documentation

## 2015-05-28 DIAGNOSIS — R059 Cough, unspecified: Secondary | ICD-10-CM

## 2015-05-28 DIAGNOSIS — J441 Chronic obstructive pulmonary disease with (acute) exacerbation: Secondary | ICD-10-CM | POA: Diagnosis not present

## 2015-05-28 DIAGNOSIS — J45909 Unspecified asthma, uncomplicated: Secondary | ICD-10-CM | POA: Diagnosis not present

## 2015-05-28 MED ORDER — LEVALBUTEROL HCL 0.63 MG/3ML IN NEBU
0.6300 mg | INHALATION_SOLUTION | Freq: Once | RESPIRATORY_TRACT | Status: AC
  Administered 2015-05-28: 0.63 mg via RESPIRATORY_TRACT

## 2015-05-28 MED ORDER — PREDNISONE 10 MG PO TABS: ORAL_TABLET | ORAL | Status: DC

## 2015-05-28 MED ORDER — DOXYCYCLINE HYCLATE 100 MG PO TABS: 100.0000 mg | ORAL_TABLET | Freq: Two times a day (BID) | ORAL | Status: DC

## 2015-05-28 NOTE — Progress Notes (Signed)
Subjective:    Patient ID: Jenna Marquez, female    DOB: [birth year], 69 y.o.   MRN: [id number]  HPI 69 yo female with COPD -GOLD 3 , former smoker .  Uses e cigs on/off   Significant tests/ events 09/2010 FeV1 49% Fef 25 75 22%   Ct chest 07/2012 - no nodules/copd changes   05/28/2015 Acute OV  : Recurrent COPD exacerbation  Pt presents for an acute office visit.  Complains of 3 days of SOB, wheezing, coughing up greenish/gray mucus.   Blames on her recent hospital stay.  Pt with recent admission for COPD Pt returns for post hospital follow up .  She was admitted 9/26 to 9/27 for dyspnea /chest tightness, w/ neg card enzymes.  Using e cigs. Discussed cessation  Is now on Atrovent /albuterol neb Four times a day  , unable to afford  Inhalers as she is in the doughnut hole.  Denies chest pain, orthopnea, edema, fever.  Recently seen by PCP with right arm swelling since IV placement several weeks ago, venous doppler showed short occlusive thrombus in basilic vein . She was recommended for warm compresses/elevation CXR reviewed from recent admission 3 weeks ago with COPD changes.  o2 sats today after walking to exam room was 97% on RA  She denies chest pain, orthopnea, edema , fever or hemoptysis .  Says not using e cigs right now   Past Medical History  Diagnosis Date  . COPD (chronic obstructive pulmonary disease) ([place])   . Asthma   . Allergic rhinitis   . 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 GERD (gastroesophageal reflux disease)   . Anxiety     "occasionally"    Current Outpatient Prescriptions on File Prior to Visit  Medication Sig Dispense Refill  . albuterol (PROVENTIL HFA;VENTOLIN HFA) 108 (90 BASE) MCG/ACT inhaler Inhale 2 puffs into the lungs every 6 (six) hours as needed for wheezing or shortness of breath. 1 Inhaler 2  . albuterol (PROVENTIL) (2.5 MG/3ML) 0.083% nebulizer solution 4 times daily; DX: J44.9 360 mL 1  . Albuterol Sulfate (PROAIR  RESPICLICK) 144 (90 BASE) MCG/ACT AEPB Inhale 2 puffs into the lungs every 6 (six) hours as needed.    . Ascorbic Acid (VITAMIN C GUMMIE PO) Take 1 tablet by mouth daily as needed (for colds).    . Cholecalciferol (VITAMIN D) 2000 UNITS CAPS Take 1 capsule by mouth daily.    Marland Kitchen ipratropium (ATROVENT) 0.02 % nebulizer solution Take 2.5 mLs (0.5 mg total) by nebulization 4 (four) times daily. DX: J44.9 300 mL 1  . Multiple Vitamin (MULTIVITAMIN) tablet Take 2 tablets by mouth every morning.      No current facility-administered medications on file prior to visit.       Review of Systems Constitutional:   No  weight loss, night sweats,  Fevers, chills,  +fatigue, or  lassitude.  HEENT:   No headaches,  Difficulty swallowing,  Tooth/dental problems, or  Sore throat,                No sneezing, itching, ear ache,  +nasal congestion, post nasal drip,   CV:  No chest pain,  Orthopnea, PND, swelling in lower extremities, anasarca, dizziness, palpitations, syncope.   GI  No heartburn, indigestion, abdominal pain, nausea, vomiting, diarrhea, change in bowel habits, loss of appetite, bloody stools.   Resp:    No chest wall deformity  Skin: no rash or lesions.  GU: no dysuria, change in  color of urine, no urgency or frequency.  No flank pain, no hematuria   MS:  No joint pain or swelling.  No decreased range of motion.  No back pain.  Psych:  No change in mood or affect. No depression or anxiety.  No memory loss.         Objective:   Physical Exam GEN: A/Ox3; pleasant , NAD, chronically ill appearing , anxious , barking cough  VS reviewed   HEENT:  [place]/AT,  EACs-clear, TMs-wnl, NOSE-clear, THROAT-clear, no lesions, no postnasal drip or exudate noted. , no stridor   NECK:  Supple w/ fair ROM; no JVD; normal carotid impulses w/o bruits; no thyromegaly or nodules palpated; no lymphadenopathy.  RESP  Exp wheezing bilaterally , speaking in full sentences , , no accessory muscle use, no  dullness to percussion  CARD:  RRR, no m/r/g  , no peripheral edema, pulses intact, no cyanosis or clubbing. Right arm along antecubital space with mild swelling, no redness , nml pulses and no distal /proximal swelling   GI:   Soft & nt; nml bowel sounds; no organomegaly or masses detected.  Musco: Warm bil, no deformities or joint swelling noted.   Neuro: alert, no focal deficits noted.    Skin: Warm, no lesions or rashes         Assessment & Plan:

## 2015-05-28 NOTE — Assessment & Plan Note (Addendum)
Exacerbation recurrent  Check cxr today  xopenex neb  W/ improved aeration   Plan  Doxycycline 100mg  Twice daily  , take with food.  Prednisone taper over next week.  Mucinex DM Twice daily  As needed  Cough/congestion .  May use Zyrtec 10mg  At bedtime  As needed  Drainage .  Chest xray today .  Use Symbicort 2 puffs Twice daily -samples until gone .  follow up Dr. Elsworth Soho  In 6 weeks and As needed   Please contact office for sooner follow up if symptoms do not improve or worsen or seek emergency care

## 2015-05-28 NOTE — Patient Instructions (Addendum)
Doxycycline 100mg  Twice daily  , take with food.  Prednisone taper over next week.  Mucinex DM Twice daily  As needed  Cough/congestion .  May use Zyrtec 10mg  At bedtime  As needed  Drainage .  Chest xray today .  Use Symbicort 2 puffs Twice daily -samples until gone .  follow up Dr. Elsworth Soho  In 6 weeks and As needed   Please contact office for sooner follow up if symptoms do not improve or worsen or seek emergency care

## 2015-05-31 NOTE — Progress Notes (Signed)
Reviewed & agree with plan  

## 2015-06-02 ENCOUNTER — Ambulatory Visit (HOSPITAL_BASED_OUTPATIENT_CLINIC_OR_DEPARTMENT_OTHER)
Admission: RE | Admit: 2015-06-02 | Discharge: 2015-06-02 | Disposition: A | Discharge status: Home / Self Care | Payer: Medicare HMO | Source: Ambulatory Visit | Attending: Family Medicine | Admitting: Family Medicine

## 2015-06-02 ENCOUNTER — Encounter: Payer: Self-pay | Admitting: General Practice

## 2015-06-02 DIAGNOSIS — Z780 Asymptomatic menopausal state: Secondary | ICD-10-CM

## 2015-06-02 DIAGNOSIS — Z1231 Encounter for screening mammogram for malignant neoplasm of breast: Secondary | ICD-10-CM | POA: Insufficient documentation

## 2015-07-22 ENCOUNTER — Ambulatory Visit (INDEPENDENT_AMBULATORY_CARE_PROVIDER_SITE_OTHER): Payer: Medicare HMO | Admitting: *Deleted

## 2015-07-22 DIAGNOSIS — I442 Atrioventricular block, complete: Secondary | ICD-10-CM

## 2015-07-22 NOTE — Progress Notes (Signed)
Remote pacemaker transmission.   

## 2015-07-30 LAB — CUP PACEART REMOTE DEVICE CHECK
Battery Voltage: 2.8 V
Brady Statistic AP VS Percent: 0 %
Brady Statistic AS VP Percent: 97 %
Brady Statistic AS VS Percent: 0 %
Implantable Lead Implant Date: 20160525
Implantable Lead Location: 753860
Implantable Lead Model: 5076
Implantable Lead Model: 5076
Lead Channel Impedance Value: 525 Ohm
Lead Channel Pacing Threshold Amplitude: 0.5 V
Lead Channel Pacing Threshold Amplitude: 0.625 V
Lead Channel Pacing Threshold Pulse Width: 0.4 ms
Lead Channel Pacing Threshold Pulse Width: 0.4 ms
Lead Channel Setting Pacing Amplitude: 2.5 V
Lead Channel Setting Sensing Sensitivity: 5.6 mV
MDC IDC LEAD IMPLANT DT: 20160525
MDC IDC LEAD LOCATION: 753859
MDC IDC MSMT BATTERY IMPEDANCE: 100 Ohm
MDC IDC MSMT BATTERY REMAINING LONGEVITY: 129 mo
MDC IDC MSMT LEADCHNL RA IMPEDANCE VALUE: 601 Ohm
MDC IDC MSMT LEADCHNL RA SENSING INTR AMPL: 1 mV
MDC IDC SESS DTM: 20161207125112
MDC IDC SET LEADCHNL RA PACING AMPLITUDE: 1.5 V
MDC IDC SET LEADCHNL RV PACING PULSEWIDTH: 0.4 ms
MDC IDC STAT BRADY AP VP PERCENT: 3 %

## 2015-07-31 ENCOUNTER — Telehealth: Payer: Self-pay | Admitting: Internal Medicine

## 2015-07-31 NOTE — Telephone Encounter (Signed)
Called patient in regards to her most recent device transmission(new afib from 07/22/15) and the need for her to be seen by Dr Lovena Le to discuss anticoagulation. She says she doesn't have any issues and drinks 4 liquor drinks a day so her blood should be plenty thin.  She went on a 15 plus minute rant about how her device was "botched" and she did not have any confidence in Dr Lovena Le.  I then offered another MD to discuss this with her as it is important she come in to discuss the need for anticoagulation in the setting of new afib to prevent stoke.  She said I could schedule the appointment with another MD but she wanted mid January and she may cancel. I offered a sooner appointment but she declined.  I have scheduled her with Dr Curt Bears for 09/02/15 at 9:30am.  I went over the symptoms of stroke and told her if she experienced any of these to seek immediate medical attention.  She verbalized understanding but says she has "no" health problems other than COPD.  She doesn't even think the pacemaker was necessary.  She says her entire left side has been larger since implant and she doesn't understand why.  She may look into this further but for now she will see Dr Curt Bears and call back if any problems.

## 2015-07-31 NOTE — Telephone Encounter (Signed)
Pt confused about the need for anticoagulation-pls call before pt will schedule pls call [phone]

## 2015-08-05 ENCOUNTER — Encounter: Payer: Self-pay | Admitting: Cardiology

## 2015-08-06 ENCOUNTER — Telehealth: Payer: Self-pay | Admitting: Pulmonary Disease

## 2015-08-06 NOTE — Telephone Encounter (Signed)
Spoke with pt. Samples of symb 160 left for pick up in the [place] office. Nothing further needed

## 2015-08-20 ENCOUNTER — Ambulatory Visit (INDEPENDENT_AMBULATORY_CARE_PROVIDER_SITE_OTHER): Payer: PPO | Admitting: Adult Health

## 2015-08-20 ENCOUNTER — Encounter: Payer: Self-pay | Admitting: Cardiology

## 2015-08-20 ENCOUNTER — Encounter: Payer: Self-pay | Admitting: Adult Health

## 2015-08-20 VITALS — BP 132/74 | HR 85 | Temp 97.4°F | Ht 64.0 in | Wt 138.0 lb

## 2015-08-20 DIAGNOSIS — J449 Chronic obstructive pulmonary disease, unspecified: Secondary | ICD-10-CM | POA: Diagnosis not present

## 2015-08-20 MED ORDER — BUDESONIDE-FORMOTEROL FUMARATE 160-4.5 MCG/ACT IN AERO: 2.0000 | INHALATION_SPRAY | Freq: Two times a day (BID) | RESPIRATORY_TRACT | Status: DC

## 2015-08-20 MED ORDER — TIOTROPIUM BROMIDE MONOHYDRATE 18 MCG IN CAPS: 18.0000 ug | ORAL_CAPSULE | Freq: Every day | RESPIRATORY_TRACT | Status: DC

## 2015-08-20 NOTE — Addendum Note (Signed)
Addended by: Osa Craver on: 08/20/2015 09:49 AM   Modules accepted: Orders, Medications

## 2015-08-20 NOTE — Assessment & Plan Note (Signed)
Mucinex DM Twice daily  As needed  Restart on Symbicort 2 puffs Twice daily   Restart Spiriva 1 puff daily  May use Albuterol neb every 4hrs as needed for wheezing /shortness of breath-this is your rescue medicine.  May use Proventil Inhaler 2 puffs every 4hrs as needed for wheezing -this is your rescue medicine Flu shot today .  Follow up Dr. Elsworth Soho in 3 months and As needed   Please contact office for sooner follow up if symptoms do not improve or worsen or seek emergency care  Follow up with primary for elevated blood pressure

## 2015-08-20 NOTE — Progress Notes (Signed)
Subjective:    Patient ID: Jenna Marquez, female    DOB: [birth year], 70 y.o.   MRN: [id number]  HPI 71 yo female with COPD -GOLD 3 , former smoker .  Uses e cigs on/off   Significant tests/ events 09/2010 FeV1 49% Fef 25 75 22%   Ct chest 07/2012 - no nodules/copd changes   08/20/2015 Follow up : COPD   Pt presents for a 3 month follow up .  Is now on Atrovent /albuterol neb 3-4  times a day.  Changed insurance , would like to go back to Spiriva and symbicort.  Was not able to afford last year on her insurance w/ [employer].  She denies chest pain, orthopnea, edema , fever or hemoptysis .  Says not using e cigs right now  CXR in Oct 2016 w/ COPD changes  PVX/Prevnar utd .  Needs flu shot     Past Medical History  Diagnosis Date  . COPD (chronic obstructive pulmonary disease) ([place])   . Asthma   . Allergic rhinitis   . 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 GERD (gastroesophageal reflux disease)   . Anxiety     "occasionally"    Current Outpatient Prescriptions on File Prior to Visit  Medication Sig Dispense Refill  . albuterol (PROVENTIL HFA;VENTOLIN HFA) 108 (90 BASE) MCG/ACT inhaler Inhale 2 puffs into the lungs every 6 (six) hours as needed for wheezing or shortness of breath. 1 Inhaler 2  . albuterol (PROVENTIL) (2.5 MG/3ML) 0.083% nebulizer solution 4 times daily; DX: J44.9 360 mL 1  . Albuterol Sulfate (PROAIR RESPICLICK) 123XX123 (90 BASE) MCG/ACT AEPB Inhale 2 puffs into the lungs every 6 (six) hours as needed.    . Cholecalciferol (VITAMIN D) 2000 UNITS CAPS Take 1 capsule by mouth daily.    Marland Kitchen ipratropium (ATROVENT) 0.02 % nebulizer solution Take 2.5 mLs (0.5 mg total) by nebulization 4 (four) times daily. DX: J44.9 300 mL 1  . Multiple Vitamin (MULTIVITAMIN) tablet Take 2 tablets by mouth every morning.     . predniSONE (DELTASONE) 10 MG tablet 4 tabs for 2 days, then 3 tabs for 2 days, 2 tabs for 2 days, then 1 tab for 2 days, then stop (Patient not  taking: Reported on 08/20/2015) 20 tablet 0   No current facility-administered medications on file prior to visit.       Review of Systems Constitutional:   No  weight loss, night sweats,  Fevers, chills,  +fatigue, or  lassitude.  HEENT:   No headaches,  Difficulty swallowing,  Tooth/dental problems, or  Sore throat,                No sneezing, itching, ear ache,  +nasal congestion, post nasal drip,   CV:  No chest pain,  Orthopnea, PND, swelling in lower extremities, anasarca, dizziness, palpitations, syncope.   GI  No heartburn, indigestion, abdominal pain, nausea, vomiting, diarrhea, change in bowel habits, loss of appetite, bloody stools.   Resp:    No chest wall deformity  Skin: no rash or lesions.  GU: no dysuria, change in color of urine, no urgency or frequency.  No flank pain, no hematuria   MS:  No joint pain or swelling.  No decreased range of motion.  No back pain.  Psych:  No change in mood or affect. No depression or anxiety.  No memory loss.         Objective:   Physical Exam GEN: A/Ox3; pleasant ,  NAD, chronically ill appearing , anxious  VS reviewed   HEENT:  [place]/AT,  EACs-clear, TMs-wnl, NOSE-clear, THROAT-clear, no lesions, no postnasal drip or exudate noted. , no stridor   NECK:  Supple w/ fair ROM; no JVD; normal carotid impulses w/o bruits; no thyromegaly or nodules palpated; no lymphadenopathy.  RESP  Decreased BS in bases , , no accessory muscle use, no dullness to percussion  CARD:  RRR, no m/r/g  , no peripheral edema, pulses intact, no cyanosis or clubbing.   GI:   Soft & nt; nml bowel sounds; no organomegaly or masses detected.  Musco: Warm bil, no deformities or joint swelling noted.   Neuro: alert, no focal deficits noted.    Skin: Warm, no lesions or rashes         Assessment & Plan:

## 2015-08-20 NOTE — Patient Instructions (Addendum)
Mucinex DM Twice daily  As needed  Restart on Symbicort 2 puffs Twice daily   Restart Spiriva 1 puff daily  May use Albuterol neb every 4hrs as needed for wheezing /shortness of breath-this is your rescue medicine.  May use Proventil Inhaler 2 puffs every 4hrs as needed for wheezing -this is your rescue medicine Flu shot today .  Follow up Dr. Elsworth Soho in 3 months and As needed   Please contact office for sooner follow up if symptoms do not improve or worsen or seek emergency care  Follow up with primary for elevated blood pressure

## 2015-08-23 NOTE — Progress Notes (Signed)
Reviewed & agree with plan  

## 2015-08-26 ENCOUNTER — Telehealth: Payer: Self-pay | Admitting: Pulmonary Disease

## 2015-08-26 ENCOUNTER — Telehealth: Payer: Self-pay | Admitting: Adult Health

## 2015-08-26 MED ORDER — ALBUTEROL SULFATE HFA 108 (90 BASE) MCG/ACT IN AERS: 2.0000 | INHALATION_SPRAY | Freq: Four times a day (QID) | RESPIRATORY_TRACT | Status: DC | PRN

## 2015-08-26 MED ORDER — BUDESONIDE-FORMOTEROL FUMARATE 160-4.5 MCG/ACT IN AERO: 2.0000 | INHALATION_SPRAY | Freq: Two times a day (BID) | RESPIRATORY_TRACT | Status: DC

## 2015-08-26 MED ORDER — TIOTROPIUM BROMIDE MONOHYDRATE 18 MCG IN CAPS: 18.0000 ug | ORAL_CAPSULE | Freq: Every day | RESPIRATORY_TRACT | Status: DC

## 2015-08-26 MED ORDER — ALBUTEROL SULFATE (2.5 MG/3ML) 0.083% IN NEBU: INHALATION_SOLUTION | RESPIRATORY_TRACT | Status: DC

## 2015-08-26 NOTE — Telephone Encounter (Signed)
Patient Jenna Marquez, I informed her meds were sent to [facility] on 08/20/2015 ov per epic and she said they were suppose to go to [facility]. She also states we were suppose to send albuterol inhaler and neb solution to pharm as well, please Jenna Marquez at previous number listed

## 2015-08-26 NOTE — Telephone Encounter (Signed)
Spoke with pt. States that she did not receive the albuterol neb and inhaler that TP wanted to have. Also states that we did not send her medications to the correct pharmacy. All her prescriptions have been sent in to the correct pharmacy. Nothing further was needed.

## 2015-08-26 NOTE — Telephone Encounter (Signed)
Pt calling to speak to nurse said that she had spoken with someone already only seeing where Jenna Marquez took msg.Jenna Marquez

## 2015-08-26 NOTE — Telephone Encounter (Signed)
Spoke with pt about this inforamtion. States that we logged her height and BP incorrectly. She is 5'7" not 5'4". Her BP was checked several times. The BP that showed up on her AVS was the first BP that was taken. States that we should have put in the additional BPs that were checked. Advised her that they were, she just can't access them. I assured her that this can be accessed by anyone in the system. Nothing further was needed.

## 2015-08-27 ENCOUNTER — Emergency Department (HOSPITAL_BASED_OUTPATIENT_CLINIC_OR_DEPARTMENT_OTHER)
Admission: EM | Admit: 2015-08-27 | Discharge: 2015-08-27 | Disposition: A | Discharge status: Home / Self Care | Payer: PPO | Attending: Emergency Medicine | Admitting: Emergency Medicine

## 2015-08-27 ENCOUNTER — Encounter (HOSPITAL_BASED_OUTPATIENT_CLINIC_OR_DEPARTMENT_OTHER): Payer: Self-pay | Admitting: Emergency Medicine

## 2015-08-27 DIAGNOSIS — Z8659 Personal history of other mental and behavioral disorders: Secondary | ICD-10-CM | POA: Diagnosis not present

## 2015-08-27 DIAGNOSIS — Z87891 Personal history of nicotine dependence: Secondary | ICD-10-CM | POA: Diagnosis not present

## 2015-08-27 DIAGNOSIS — M5412 Radiculopathy, cervical region: Secondary | ICD-10-CM

## 2015-08-27 DIAGNOSIS — J449 Chronic obstructive pulmonary disease, unspecified: Secondary | ICD-10-CM | POA: Insufficient documentation

## 2015-08-27 DIAGNOSIS — Z7951 Long term (current) use of inhaled steroids: Secondary | ICD-10-CM | POA: Insufficient documentation

## 2015-08-27 DIAGNOSIS — Z950 Presence of cardiac pacemaker: Secondary | ICD-10-CM | POA: Insufficient documentation

## 2015-08-27 DIAGNOSIS — Z8679 Personal history of other diseases of the circulatory system: Secondary | ICD-10-CM | POA: Insufficient documentation

## 2015-08-27 DIAGNOSIS — Z79899 Other long term (current) drug therapy: Secondary | ICD-10-CM | POA: Insufficient documentation

## 2015-08-27 DIAGNOSIS — Z8719 Personal history of other diseases of the digestive system: Secondary | ICD-10-CM | POA: Insufficient documentation

## 2015-08-27 DIAGNOSIS — R05 Cough: Secondary | ICD-10-CM | POA: Diagnosis not present

## 2015-08-27 DIAGNOSIS — R061 Stridor: Secondary | ICD-10-CM

## 2015-08-27 DIAGNOSIS — M542 Cervicalgia: Secondary | ICD-10-CM | POA: Diagnosis not present

## 2015-08-27 DIAGNOSIS — R52 Pain, unspecified: Secondary | ICD-10-CM | POA: Diagnosis not present

## 2015-08-27 MED ORDER — RACEPINEPHRINE HCL 2.25 % IN NEBU
0.5000 mL | INHALATION_SOLUTION | Freq: Once | RESPIRATORY_TRACT | Status: AC
  Administered 2015-08-27: 0.5 mL via RESPIRATORY_TRACT
  Filled 2015-08-27: qty 0.5

## 2015-08-27 MED ORDER — SODIUM CHLORIDE 0.9 % IN NEBU
INHALATION_SOLUTION | RESPIRATORY_TRACT | Status: AC
  Filled 2015-08-27: qty 3

## 2015-08-27 MED ORDER — HYDROCODONE-ACETAMINOPHEN 5-325 MG PO TABS: 1.0000 | ORAL_TABLET | Freq: Four times a day (QID) | ORAL | Status: DC | PRN

## 2015-08-27 MED ORDER — BUPIVACAINE-EPINEPHRINE (PF) 0.5% -1:200000 IJ SOLN
1.8000 mL | Freq: Once | INTRAMUSCULAR | Status: AC
  Administered 2015-08-27: 1.8 mL
  Filled 2015-08-27: qty 1.8

## 2015-08-27 NOTE — ED Notes (Signed)
Pain on right side of neck and shoulder yesterday morning and tonight it "shifted" to the left neck and shoulder.  Cough. Sore throat. Neck pain caused pt to be concerned that she was having a stroke.

## 2015-08-27 NOTE — Discharge Instructions (Signed)

## 2015-08-27 NOTE — ED Provider Notes (Signed)
CSN: [id number]     Arrival date & time 08/27/15  0230 History   First MD Initiated Contact with Patient 08/27/15 0253     Chief Complaint  Patient presents with  . Neck Pain     (Consider location/radiation/quality/duration/timing/severity/associated sxs/prior Treatment) HPI  This is a 70 year old female with a history of COPD. She is here with acute left neck pain. The pain is sharp and begins in her left posterior lateral neck radiating to her left shoulder. The pain is exacerbated by rotation of her head to the left or with anterior or posterior flexion of the neck. Pain is eased with rotation of the head to the right. There is no associated numbness or weakness. She had a much milder pain on the right side yesterday which is no longer present. She is also complaining of a cough with sore throat and wheezing for the past several days.  Past Medical History  Diagnosis Date  . COPD (chronic obstructive pulmonary disease) ([place])   . Asthma   . Allergic rhinitis   . 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 GERD (gastroesophageal reflux disease)   . Anxiety     "occasionally"   Past Surgical History  Procedure Laterality Date  . Ectopic pregnancy surgery  1974  . Tonsillectomy and adenoidectomy  1954  . Ep implantable device N/A 01/07/2015    Procedure: Pacemaker Implant;  Surgeon: Evans Lance, MD;  Location: [facility];  Service: Cardiovascular;  Laterality: N/A;  . Pacemaker insertion     Family History  Problem Relation Age of Onset  . Coronary artery disease Mother   . Heart disease Mother     CHF  . Diabetes Neg Hx   . Cancer Neg Hx   . Stroke Neg Hx   . Colon cancer Neg Hx    Social History  Substance Use Topics  . Smoking status: Former Smoker -- 1.00 packs/day for 35 years    Types: Cigarettes    Quit date: 08/10/2012  . Smokeless tobacco: Never Used     Comment: Started at age 3  . Alcohol Use: 16.8 oz/week    28 Shots of liquor per week      Comment: 01/06/2015 "2 shot mixed drinks, 2/day"   OB History    No data available     Review of Systems  All other systems reviewed and are negative.   Allergies  Avelox  Home Medications   Prior to Admission medications   Medication Sig Start Date End Date Taking? Authorizing Provider  albuterol (PROVENTIL HFA;VENTOLIN HFA) 108 (90 Base) MCG/ACT inhaler Inhale 2 puffs into the lungs every 6 (six) hours as needed for wheezing or shortness of breath. 08/26/15   Tammy S Parrett, NP  albuterol (PROVENTIL) (2.5 MG/3ML) 0.083% nebulizer solution 4 times daily; DX: J44.9 08/26/15   Tammy S Parrett, NP  budesonide-formoterol (SYMBICORT) 160-4.5 MCG/ACT inhaler Inhale 2 puffs into the lungs 2 (two) times daily. 08/26/15   Tammy S Parrett, NP  Cholecalciferol (VITAMIN D) 2000 UNITS CAPS Take 1 capsule by mouth daily.    Historical Provider, MD  Multiple Vitamin (MULTIVITAMIN) tablet Take 2 tablets by mouth every morning.     Historical Provider, MD  tiotropium (SPIRIVA) 18 MCG inhalation capsule Place 1 capsule (18 mcg total) into inhaler and inhale daily. 08/26/15   Tammy S Parrett, NP   BP 142/63 mmHg  Pulse 74  Temp(Src) 97.8 F (36.6 C) (Oral)  Resp 20  Ht 5\' 6"  (1.676 m)  Wt 138 lb (62.596 kg)  BMI 22.28 kg/m2  SpO2 98%   Physical Exam  General: Well-developed, well-nourished female in no acute distress; appearance consistent with age of record HENT: normocephalic; atraumatic Eyes: pupils equal, round and reactive to light; extraocular muscles intact Neck: supple; pain on rotation to the left or anterior or posterior Heart: regular rate and rhythm; no murmurs, rubs or gallops Lungs: Stridor Abdomen: soft; nondistended; nontender; bowel sounds present Extremities: No deformity; full range of motion; pulses normal Neurologic: Awake, alert and oriented; motor function intact in all extremities and symmetric; sensation intact and symmetric; no facial droop Skin: Warm and  dry Psychiatric: Normal mood and affect    ED Course  Procedures (including critical care time)  POSTERIOR CERVICAL BLOCK 1.8 milliliters of 0.5% bupivacaine with epinephrine were injected into the soft tissue of the neck 1.5 centimeters above and 1.5 centimeters to the left of the vertebral prominens. The patient tolerated this well and there were no immediate complications. Adequate analgesia was obtained.  MDM  3:38 AM Stridor resolved after racemic epinephrine neb treatment.  The patient's neck symptoms and exam are consistent with a cervical radiculopathy unrelated to her respiratory complaints. The patient repeatedly insists "I don't have a pinched nerve, just lung problems", was advised that this is my diagnosis.   Shanon Rosser, MD 08/27/15 [phone]

## 2015-09-02 ENCOUNTER — Other Ambulatory Visit: Payer: Self-pay | Admitting: Pulmonary Disease

## 2015-09-02 ENCOUNTER — Encounter: Payer: Medicare HMO | Admitting: Cardiology

## 2015-09-08 ENCOUNTER — Encounter: Payer: Self-pay | Admitting: Cardiology

## 2015-09-08 ENCOUNTER — Ambulatory Visit (INDEPENDENT_AMBULATORY_CARE_PROVIDER_SITE_OTHER): Payer: PPO | Admitting: Cardiology

## 2015-09-08 ENCOUNTER — Encounter: Payer: PPO | Admitting: Cardiology

## 2015-09-08 VITALS — BP 132/74 | HR 86 | Ht 66.0 in | Wt 141.0 lb

## 2015-09-08 DIAGNOSIS — Z45018 Encounter for adjustment and management of other part of cardiac pacemaker: Secondary | ICD-10-CM

## 2015-09-08 DIAGNOSIS — I442 Atrioventricular block, complete: Secondary | ICD-10-CM

## 2015-09-08 LAB — CUP PACEART INCLINIC DEVICE CHECK
Battery Impedance: 110 Ohm
Brady Statistic AP VS Percent: 0 %
Implantable Lead Implant Date: 20160525
Implantable Lead Location: 753859
Implantable Lead Location: 753860
Implantable Lead Model: 5076
Lead Channel Impedance Value: 573 Ohm
Lead Channel Pacing Threshold Amplitude: 0.375 V
Lead Channel Pacing Threshold Amplitude: 0.5 V
Lead Channel Pacing Threshold Amplitude: 0.625 V
Lead Channel Pacing Threshold Amplitude: 0.75 V
Lead Channel Pacing Threshold Pulse Width: 0.4 ms
Lead Channel Pacing Threshold Pulse Width: 0.4 ms
Lead Channel Sensing Intrinsic Amplitude: 2 mV
Lead Channel Setting Pacing Pulse Width: 0.4 ms
MDC IDC LEAD IMPLANT DT: 20160525
MDC IDC MSMT BATTERY REMAINING LONGEVITY: 125 mo
MDC IDC MSMT BATTERY VOLTAGE: 2.79 V
MDC IDC MSMT LEADCHNL RA PACING THRESHOLD PULSEWIDTH: 0.4 ms
MDC IDC MSMT LEADCHNL RV IMPEDANCE VALUE: 513 Ohm
MDC IDC MSMT LEADCHNL RV PACING THRESHOLD PULSEWIDTH: 0.4 ms
MDC IDC MSMT LEADCHNL RV SENSING INTR AMPL: 22.4 mV
MDC IDC SESS DTM: 20170124162809
MDC IDC SET LEADCHNL RA PACING AMPLITUDE: 1.5 V
MDC IDC SET LEADCHNL RV PACING AMPLITUDE: 2.5 V
MDC IDC SET LEADCHNL RV SENSING SENSITIVITY: 5.6 mV
MDC IDC STAT BRADY AP VP PERCENT: 3 %
MDC IDC STAT BRADY AS VP PERCENT: 97 %
MDC IDC STAT BRADY AS VS PERCENT: 0 %

## 2015-09-08 NOTE — Patient Instructions (Signed)
Medication Instructions:  Your physician recommends that you continue on your current medications as directed. Please refer to the Current Medication list given to you today.  Labwork: None ordered  Testing/Procedures: None ordered  Follow-Up: Your physician wants you to follow-up in: 1 year with Dr. Curt Bears.  You will receive a reminder letter in the mail two months in advance. If you don't receive a letter, please call our office to schedule the follow-up appointment.  Any Other Special Instructions Will Be Listed Below (If Applicable). Information on Atrial Fibrillation was given to you today.   If you need a refill on your cardiac medications before your next appointment, please call your pharmacy.  Thank you for choosing [facility]!!   Trinidad Curet, RN [phone]   Atrial Fibrillation Atrial fibrillation is a type of irregular or rapid heartbeat (arrhythmia). In atrial fibrillation, the heart quivers continuously in a chaotic pattern. This occurs when parts of the heart receive disorganized signals that make the heart unable to pump blood normally. This can increase the risk for stroke, heart failure, and other heart-related conditions. There are different types of atrial fibrillation, including:  Paroxysmal atrial fibrillation. This type starts suddenly, and it usually stops on its own shortly after it starts.  Persistent atrial fibrillation. This type often lasts longer than a week. It may stop on its own or with treatment.  Long-lasting persistent atrial fibrillation. This type lasts longer than 12 months.  Permanent atrial fibrillation. This type does not go away. Talk with your health care provider to learn about the type of atrial fibrillation that you have. CAUSES This condition is caused by some heart-related conditions or procedures, including:  A heart attack.  Coronary artery disease.  Heart failure.  Heart valve conditions.  High blood  pressure.  Inflammation of the sac that surrounds the heart (pericarditis).  Heart surgery.  Certain heart rhythm disorders, such as Wolf-Parkinson-White syndrome. Other causes include:  Pneumonia.  Obstructive sleep apnea.  Blockage of an artery in the lungs (pulmonary embolism, or PE).  Lung cancer.  Chronic lung disease.  Thyroid problems, especially if the thyroid is overactive (hyperthyroidism).  Caffeine.  Excessive alcohol use or illegal drug use.  Use of some medicines, including certain decongestants and diet pills. Sometimes, the cause cannot be found. RISK FACTORS This condition is more likely to develop in:  People who are older in age.  People who smoke.  People who have diabetes mellitus.  People who are overweight (obese).  Athletes who exercise vigorously. SYMPTOMS Symptoms of this condition include:  A feeling that your heart is beating rapidly or irregularly.  A feeling of discomfort or pain in your chest.  Shortness of breath.  Sudden light-headedness or weakness.  Getting tired easily during exercise. In some cases, there are no symptoms. DIAGNOSIS Your health care provider may be able to detect atrial fibrillation when taking your pulse. If detected, this condition may be diagnosed with:  An electrocardiogram (ECG).  A Holter monitor test that records your heartbeat patterns over a 24-hour period.  Transthoracic echocardiogram (TTE) to evaluate how blood flows through your heart.  Transesophageal echocardiogram (TEE) to view more detailed images of your heart.  A stress test.  Imaging tests, such as a CT scan or chest X-ray.  Blood tests. TREATMENT The main goals of treatment are to prevent blood clots from forming and to keep your heart beating at a normal rate and rhythm. The type of treatment that you receive depends on many  factors, such as your underlying medical conditions and how you feel when you are experiencing atrial  fibrillation. This condition may be treated with:  Medicine to slow down the heart rate, bring the heart's rhythm back to normal, or prevent clots from forming.  Electrical cardioversion. This is a procedure that resets your heart's rhythm by delivering a controlled, low-energy shock to the heart through your skin.  Different types of ablation, such as catheter ablation, catheter ablation with pacemaker, or surgical ablation. These procedures destroy the heart tissues that send abnormal signals. When the pacemaker is used, it is placed under your skin to help your heart beat in a regular rhythm. HOME CARE INSTRUCTIONS  Take over-the counter and prescription medicines only as told by your health care provider.  If your health care provider prescribed a blood-thinning medicine (anticoagulant), take it exactly as told. Taking too much blood-thinning medicine can cause bleeding. If you do not take enough blood-thinning medicine, you will not have the protection that you need against stroke and other problems.  Do not use tobacco products, including cigarettes, chewing tobacco, and e-cigarettes. If you need help quitting, ask your health care provider.  If you have obstructive sleep apnea, manage your condition as told by your health care provider.  Do not drink alcohol.  Do not drink beverages that contain caffeine, such as coffee, soda, and tea.  Maintain a healthy weight. Do not use diet pills unless your health care provider approves. Diet pills may make heart problems worse.  Follow diet instructions as told by your health care provider.  Exercise regularly as told by your health care provider.  Keep all follow-up visits as told by your health care provider. This is important. PREVENTION  Avoid drinking beverages that contain caffeine or alcohol.  Avoid certain medicines, especially medicines that are used for breathing problems.  Avoid certain herbs and herbal medicines, such as  those that contain ephedra or ginseng.  Do not use illegal drugs, such as cocaine and amphetamines.  Do not smoke.  Manage your high blood pressure. SEEK MEDICAL CARE IF:  You notice a change in the rate, rhythm, or strength of your heartbeat.  You are taking an anticoagulant and you notice increased bruising.  You tire more easily when you exercise or exert yourself. SEEK IMMEDIATE MEDICAL CARE IF:  You have chest pain, abdominal pain, sweating, or weakness.  You feel nauseous.  You notice blood in your vomit, bowel movement, or urine.  You have shortness of breath.  You suddenly have swollen feet and ankles.  You feel dizzy.  You have sudden weakness or numbness of the face, arm, or leg, especially on one side of the body.  You have trouble speaking, trouble understanding, or both (aphasia).  Your face or your eyelid droops on one side. These symptoms may represent a serious problem that is an emergency. Do not wait to see if the symptoms will go away. Get medical help right away. Call your local emergency services (911 in the [place] Do not drive yourself to the hospital.   This information is not intended to replace advice given to you by your health care provider. Make sure you discuss any questions you have with your health care provider.   Document Released: 08/01/2005 Document Revised: 04/22/2015 Document Reviewed: 11/26/2014 Elsevier Interactive Patient Education [employer].     Stroke Prevention Some medical conditions and behaviors are associated with an increased chance of having a stroke. You may prevent a stroke  by making healthy choices and managing medical conditions. HOW CAN I REDUCE MY RISK OF HAVING A STROKE?   Stay physically active. Get at least 30 minutes of activity on most or all days.  Do not smoke. It may also be helpful to avoid exposure to secondhand smoke.  Limit alcohol use. Moderate alcohol use is considered to be:  No more  than 2 drinks per day for men.  No more than 1 drink per day for nonpregnant women.  Eat healthy foods. This involves:  Eating 5 or more servings of fruits and vegetables a day.  Making dietary changes that address high blood pressure (hypertension), high cholesterol, diabetes, or obesity.  Manage your cholesterol levels.  Making food choices that are high in fiber and low in saturated fat, trans fat, and cholesterol may control cholesterol levels.  Take any prescribed medicines to control cholesterol as directed by your health care provider.  Manage your diabetes.  Controlling your carbohydrate and sugar intake is recommended to manage diabetes.  Take any prescribed medicines to control diabetes as directed by your health care provider.  Control your hypertension.  Making food choices that are low in salt (sodium), saturated fat, trans fat, and cholesterol is recommended to manage hypertension.  Ask your health care provider if you need treatment to lower your blood pressure. Take any prescribed medicines to control hypertension as directed by your health care provider.  If you are 83-37 years of age, have your blood pressure checked every 3-5 years. If you are 66 years of age or older, have your blood pressure checked every year.  Maintain a healthy weight.  Reducing calorie intake and making food choices that are low in sodium, saturated fat, trans fat, and cholesterol are recommended to manage weight.  Stop drug abuse.  Avoid taking birth control pills.  Talk to your health care provider about the risks of taking birth control pills if you are over 50 years old, smoke, get migraines, or have ever had a blood clot.  Get evaluated for sleep disorders (sleep apnea).  Talk to your health care provider about getting a sleep evaluation if you snore a lot or have excessive sleepiness.  Take medicines only as directed by your health care provider.  For some people, aspirin  or blood thinners (anticoagulants) are helpful in reducing the risk of forming abnormal blood clots that can lead to stroke. If you have the irregular heart rhythm of atrial fibrillation, you should be on a blood thinner unless there is a good reason you cannot take them.  Understand all your medicine instructions.  Make sure that other conditions (such as anemia or atherosclerosis) are addressed. SEEK IMMEDIATE MEDICAL CARE IF:   You have sudden weakness or numbness of the face, arm, or leg, especially on one side of the body.  Your face or eyelid droops to one side.  You have sudden confusion.  You have trouble speaking (aphasia) or understanding.  You have sudden trouble seeing in one or both eyes.  You have sudden trouble walking.  You have dizziness.  You have a loss of balance or coordination.  You have a sudden, severe headache with no known cause.  You have new chest pain or an irregular heartbeat. Any of these symptoms may represent a serious problem that is an emergency. Do not wait to see if the symptoms will go away. Get medical help at once. Call your local emergency services (911 in [place] Do not drive yourself to the  hospital.   This information is not intended to replace advice given to you by your health care provider. Make sure you discuss any questions you have with your health care provider.   Document Released: 09/08/2004 Document Revised: 08/22/2014 Document Reviewed: 02/01/2013 Elsevier Interactive Patient Education [employer].

## 2015-09-08 NOTE — Progress Notes (Signed)
Electrophysiology Office Note   Date:  09/08/2015   ID:  Jenna Marquez, DOB 09-02-1945, MRN [id number]  PCP:  Annye Asa, MD  Cardiologist:  Lovena Le Primary Electrophysiologist:  Will Meredith Leeds, MD    No chief complaint on file.    History of Present Illness: Jenna Marquez is a 70 y.o. female who presents today for electrophysiology evaluation.   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 She presents today as follow-up for atrial fibrillation found on her monitor.her monitor showed 2 episodes of atrial rates greater than 400 both episodes lasting about one hour. These were sent in on remote monitor and we do not have the strips of these. She said that she was asymptomatic and had no complaints of palpitations shortness of breath or fatigue. She does complain of chest pain and has many complaints of her pacemaker.  Today, she denies symptoms of palpitations, chest pain, shortness of breath, orthopnea, PND, lower extremity edema, claudication, dizziness, presyncope, syncope, bleeding, or neurologic sequela. The patient is tolerating medications without difficulties and is otherwise without complaint today.    Past Medical History  Diagnosis Date  . COPD (chronic obstructive pulmonary disease) ([place])   . Asthma   . Allergic rhinitis   . 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 GERD (gastroesophageal reflux disease)   . Anxiety     "occasionally"   Past Surgical History  Procedure Laterality Date  . Ectopic pregnancy surgery  1974  . Tonsillectomy and adenoidectomy  1954  . Ep implantable device N/A 01/07/2015    Procedure: Pacemaker Implant;  Surgeon: Evans Lance, MD;  Location: [facility];  Service: Cardiovascular;  Laterality: N/A;  . Pacemaker insertion       Current Outpatient Prescriptions  Medication Sig Dispense Refill  . albuterol (PROVENTIL HFA;VENTOLIN HFA) 108 (90 Base)  MCG/ACT inhaler Inhale 2 puffs into the lungs every 6 (six) hours as needed for wheezing or shortness of breath. 1 Inhaler 2  . albuterol (PROVENTIL) (2.5 MG/3ML) 0.083% nebulizer solution 4 times daily; DX: J44.9 360 mL 1  . budesonide-formoterol (SYMBICORT) 160-4.5 MCG/ACT inhaler Inhale 2 puffs into the lungs 2 (two) times daily. 1 Inhaler 5  . Cholecalciferol (VITAMIN D) 2000 UNITS CAPS Take 1 capsule by mouth daily.    Marland Kitchen HYDROcodone-acetaminophen (NORCO) 5-325 MG tablet Take 1 tablet by mouth every 6 (six) hours as needed (for neck pain). 10 tablet 0  . Multiple Vitamin (MULTIVITAMIN) tablet Take 2 tablets by mouth every morning.     . tiotropium (SPIRIVA) 18 MCG inhalation capsule Place 1 capsule (18 mcg total) into inhaler and inhale daily. 30 capsule 5   No current facility-administered medications for this visit.    Allergies:   Avelox   Social History:  The patient  reports that she quit smoking about 3 years ago. Her smoking use included Cigarettes. She has a 35 pack-year smoking history. She has never used smokeless tobacco. She reports that she drinks about 16.8 oz of alcohol per week. She reports that she does not use illicit drugs.   Family History:  The patient's family history includes Coronary artery disease in her mother; Heart disease in her mother. There is no history of Diabetes, Cancer, Stroke, or Colon cancer.    ROS:  Please see the history of present illness.   All other systems are reviewed and negative.    PHYSICAL EXAM: VS:  There were no vitals taken for this visit. , BMI There is no weight on file to calculate BMI. GEN: Well nourished, well developed, in no acute distress HEENT: normal Neck: no JVD, carotid bruits, or masses Cardiac: RRR; no murmurs, rubs, or gallops,no edema  Respiratory:  clear to auscultation bilaterally, normal work of breathing GI: soft, nontender, nondistended, + BS MS: no deformity or atrophy Skin: warm and dry Neuro:  Strength and  sensation are intact Psych: euthymic mood, full affect  EKG:  EKG is ordered today. The ekg ordered today shows sinus rhythm, V paced   Device interrogation is reviewed today in detail.  See PaceArt for details. Two episodes with A rates >400  Recent Labs: 01/06/2015: TSH 2.011 05/12/2015: ALT 31; BUN 13; Creatinine, Ser 1.04*; Hemoglobin 12.4; Platelets 221; Potassium 3.9; Sodium 138    Lipid Panel     Component Value Date/Time   CHOL 218* 10/29/2014 1415   TRIG 53.0 10/29/2014 1415   HDL 113.80 10/29/2014 1415   CHOLHDL 2 10/29/2014 1415   VLDL 10.6 10/29/2014 1415   LDLCALC 94 10/29/2014 1415   LDLDIRECT 114.2 01/30/2013 1133     Wt Readings from Last 3 Encounters:  08/27/15 138 lb (62.596 kg)  08/20/15 138 lb (62.596 kg)  05/28/15 137 lb (62.143 kg)      Other studies Reviewed: Additional studies/ records that were reviewed today include:  TTE 01/07/15  Review of the above records today demonstrates:  - Left ventricle: The cavity size was normal. Wall thickness was normal. The estimated ejection fraction was 65%. Wall motion was normal; there were no regional wall motion abnormalities. - Aortic valve: Sclerosis without stenosis. There was no significant regurgitation. - Right ventricle: The cavity size was normal. Lateral annulus peak S velocity: 19.4 cm/s.   ASSESSMENT AND PLAN:  1.  2:1 AV block: Dual-chamber pacemaker in place, functioning appropriately. No changes necessary at this time.  2. Paroxysmal atrial fibrillation: Incidentally found on her monitor. She was asymptomatic. She has a CHADS2VASc of 2 for age and female gender. We discussed the options of anticoagulation including Coumadin versus normal anticoagulants. At this time because she feels well she does not feel like she needs anticoagulation. We have given her information on atrial fibrillation and stroke she will go home to review these and if she changes her mind she'll call clinic  back.  Current medicines are reviewed at length with the patient today.   The patient does not have concerns regarding her medicines.  The following changes were made today:  none  Labs/ tests ordered today include:  No orders of the defined types were placed in this encounter.     Disposition:   FU with Will Camnitz 1 year  Signed, Will Meredith Leeds, MD  09/08/2015 1:23 PM     [place] [address] [phone] (office) [phone] (fax)

## 2015-09-10 ENCOUNTER — Encounter (HOSPITAL_BASED_OUTPATIENT_CLINIC_OR_DEPARTMENT_OTHER): Payer: Self-pay | Admitting: *Deleted

## 2015-09-10 ENCOUNTER — Telehealth: Payer: Self-pay | Admitting: Cardiology

## 2015-09-10 ENCOUNTER — Emergency Department (HOSPITAL_BASED_OUTPATIENT_CLINIC_OR_DEPARTMENT_OTHER): Payer: PPO

## 2015-09-10 ENCOUNTER — Emergency Department (HOSPITAL_BASED_OUTPATIENT_CLINIC_OR_DEPARTMENT_OTHER)
Admission: EM | Admit: 2015-09-10 | Discharge: 2015-09-10 | Disposition: A | Discharge status: Home / Self Care | Payer: PPO | Attending: Emergency Medicine | Admitting: Emergency Medicine

## 2015-09-10 DIAGNOSIS — J441 Chronic obstructive pulmonary disease with (acute) exacerbation: Secondary | ICD-10-CM | POA: Insufficient documentation

## 2015-09-10 DIAGNOSIS — Z8719 Personal history of other diseases of the digestive system: Secondary | ICD-10-CM | POA: Diagnosis not present

## 2015-09-10 DIAGNOSIS — R000 Tachycardia, unspecified: Secondary | ICD-10-CM | POA: Diagnosis not present

## 2015-09-10 DIAGNOSIS — Z8679 Personal history of other diseases of the circulatory system: Secondary | ICD-10-CM | POA: Diagnosis not present

## 2015-09-10 DIAGNOSIS — R079 Chest pain, unspecified: Secondary | ICD-10-CM | POA: Diagnosis not present

## 2015-09-10 DIAGNOSIS — F419 Anxiety disorder, unspecified: Secondary | ICD-10-CM | POA: Insufficient documentation

## 2015-09-10 DIAGNOSIS — Z79899 Other long term (current) drug therapy: Secondary | ICD-10-CM | POA: Insufficient documentation

## 2015-09-10 DIAGNOSIS — Z87891 Personal history of nicotine dependence: Secondary | ICD-10-CM | POA: Insufficient documentation

## 2015-09-10 DIAGNOSIS — R071 Chest pain on breathing: Secondary | ICD-10-CM | POA: Diagnosis not present

## 2015-09-10 DIAGNOSIS — Z950 Presence of cardiac pacemaker: Secondary | ICD-10-CM | POA: Insufficient documentation

## 2015-09-10 DIAGNOSIS — R0602 Shortness of breath: Secondary | ICD-10-CM | POA: Diagnosis not present

## 2015-09-10 DIAGNOSIS — Z9889 Other specified postprocedural states: Secondary | ICD-10-CM | POA: Insufficient documentation

## 2015-09-10 DIAGNOSIS — Z7951 Long term (current) use of inhaled steroids: Secondary | ICD-10-CM | POA: Diagnosis not present

## 2015-09-10 LAB — COMPREHENSIVE METABOLIC PANEL
ALBUMIN: 3.6 g/dL (ref 3.5–5.0)
ALT: 20 U/L (ref 14–54)
ANION GAP: 15 (ref 5–15)
AST: 44 U/L — AB (ref 15–41)
Alkaline Phosphatase: 108 U/L (ref 38–126)
BILIRUBIN TOTAL: 0.6 mg/dL (ref 0.3–1.2)
BUN: 14 mg/dL (ref 6–20)
CHLORIDE: 106 mmol/L (ref 101–111)
CO2: 20 mmol/L — ABNORMAL LOW (ref 22–32)
Calcium: 9.1 mg/dL (ref 8.9–10.3)
Creatinine, Ser: 0.84 mg/dL (ref 0.44–1.00)
GFR calc Af Amer: >60 mL/min (ref >60)
GFR calc non Af Amer: >60 mL/min (ref >60)
GLUCOSE: 88 mg/dL (ref 65–99)
POTASSIUM: 4 mmol/L (ref 3.5–5.1)
Sodium: 141 mmol/L (ref 135–145)
TOTAL PROTEIN: 7.1 g/dL (ref 6.5–8.1)

## 2015-09-10 LAB — CBC WITH DIFFERENTIAL/PLATELET
BASOS ABS: 0 10*3/uL (ref 0.0–0.1)
Basophils Relative: 0 %
Eosinophils Absolute: 0.1 10*3/uL (ref 0.0–0.7)
Eosinophils Relative: 1 %
HEMATOCRIT: 39.9 % (ref 36.0–46.0)
Hemoglobin: 13.2 g/dL (ref 12.0–15.0)
LYMPHS ABS: 1.2 10*3/uL (ref 0.7–4.0)
LYMPHS PCT: 12 %
MCH: 35 pg — AB (ref 26.0–34.0)
MCHC: 33.1 g/dL (ref 30.0–36.0)
MCV: 105.8 fL — AB (ref 78.0–100.0)
MONO ABS: 1 10*3/uL (ref 0.1–1.0)
Monocytes Relative: 10 %
NEUTROS ABS: 7.7 10*3/uL (ref 1.7–7.7)
Neutrophils Relative %: 77 %
Platelets: 376 10*3/uL (ref 150–400)
RBC: 3.77 MIL/uL — AB (ref 3.87–5.11)
RDW: 12.8 % (ref 11.5–15.5)
WBC: 10 10*3/uL (ref 4.0–10.5)

## 2015-09-10 LAB — TROPONIN I
Troponin I: <0.03 ng/mL (ref <0.031)
Troponin I: <0.03 ng/mL (ref <0.031)

## 2015-09-10 MED ORDER — PREDNISONE 10 MG PO TABS: 40.0000 mg | ORAL_TABLET | Freq: Every day | ORAL | Status: AC

## 2015-09-10 MED ORDER — IOHEXOL 350 MG/ML SOLN
100.0000 mL | Freq: Once | INTRAVENOUS | Status: AC | PRN
  Administered 2015-09-10: 100 mL via INTRAVENOUS

## 2015-09-10 MED ORDER — IPRATROPIUM-ALBUTEROL 0.5-2.5 (3) MG/3ML IN SOLN
3.0000 mL | Freq: Once | RESPIRATORY_TRACT | Status: AC
  Administered 2015-09-10: 3 mL via RESPIRATORY_TRACT
  Filled 2015-09-10: qty 3

## 2015-09-10 MED ORDER — ALBUTEROL SULFATE HFA 108 (90 BASE) MCG/ACT IN AERS
2.0000 | INHALATION_SPRAY | RESPIRATORY_TRACT | Status: DC | PRN
  Filled 2015-09-10: qty 6.7

## 2015-09-10 MED ORDER — METHYLPREDNISOLONE SODIUM SUCC 125 MG IJ SOLR
125.0000 mg | Freq: Once | INTRAMUSCULAR | Status: AC
  Administered 2015-09-10: 125 mg via INTRAVENOUS
  Filled 2015-09-10: qty 2

## 2015-09-10 MED ORDER — HEPARIN (PORCINE) IN NACL 100-0.45 UNIT/ML-% IJ SOLN
1050.0000 [IU]/h | INTRAMUSCULAR | Status: DC
  Administered 2015-09-10: 1050 [IU]/h via INTRAVENOUS
  Filled 2015-09-10: qty 250

## 2015-09-10 MED ORDER — HEPARIN BOLUS VIA INFUSION
4000.0000 [IU] | Freq: Once | INTRAVENOUS | Status: AC
  Administered 2015-09-10: 4000 [IU] via INTRAVENOUS

## 2015-09-10 MED ORDER — ASPIRIN 81 MG PO CHEW
324.0000 mg | CHEWABLE_TABLET | Freq: Once | ORAL | Status: AC
  Administered 2015-09-10: 324 mg via ORAL
  Filled 2015-09-10: qty 4

## 2015-09-10 NOTE — ED Notes (Signed)
Pt. Reports spitting up white thick sputum each morning

## 2015-09-10 NOTE — Telephone Encounter (Signed)
New message     Pt c/o of Chest Pain: STAT if CP now or developed within 24 hours  1. Are you having CP right now? Yes / pain scale 1-10  8   2. Are you experiencing any other symptoms (ex. SOB, nausea, vomiting, sweating)? With sob   3. How long have you been experiencing CP? This am   4. Is your CP continuous or coming and going? Continuous   5. Have you taken Nitroglycerin? No  ?

## 2015-09-10 NOTE — ED Provider Notes (Signed)
CSN: [id number]    Arrival date & time 09/10/15  1414 History   First MD Initiated Contact with Patient 09/10/15 1431     Chief Complaint  Patient presents with  . Chest Pain  . Shortness of Breath     Patient is a 70 y.o. female presenting with chest pain and shortness of breath. The history is provided by the patient. No language interpreter was used.  Chest Pain Associated symptoms: shortness of breath   Shortness of Breath Associated symptoms: chest pain     Jenna Marquez is a 70 y.o. female who presents to the Emergency Department complaining of chest pain, SOB.  At 945 today she was getting into her shower when she felt palpitations in her chest followed by severe, central chest pain with shortness of breath. Pain is worse with deep breaths.  She denies fevers, abdominal pain, vomiting.  No prior similar sxs.  Sxs are severe, constant, worsening.    Past Medical History  Diagnosis Date  . COPD (chronic obstructive pulmonary disease) ([place])   . Asthma   . Allergic rhinitis   . 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 GERD (gastroesophageal reflux disease)   . Anxiety     "occasionally"   Past Surgical History  Procedure Laterality Date  . Ectopic pregnancy surgery  1974  . Tonsillectomy and adenoidectomy  1954  . Ep implantable device N/A 01/07/2015    Procedure: Pacemaker Implant;  Surgeon: Evans Lance, MD;  Location: [facility];  Service: Cardiovascular;  Laterality: N/A;  . Pacemaker insertion     Family History  Problem Relation Age of Onset  . Coronary artery disease Mother   . Heart disease Mother     CHF  . Diabetes Neg Hx   . Cancer Neg Hx   . Stroke Neg Hx   . Colon cancer Neg Hx    Social History  Substance Use Topics  . Smoking status: Former Smoker -- 1.00 packs/day for 35 years    Types: Cigarettes    Quit date: 08/10/2012  . Smokeless tobacco: Never Used     Comment: Started at age 25  . Alcohol Use: 16.8 oz/week    28 Shots  of liquor per week     Comment: 01/06/2015 "2 shot mixed drinks, 2/day"   OB History    No data available     Review of Systems  Respiratory: Positive for shortness of breath.   Cardiovascular: Positive for chest pain.  All other systems reviewed and are negative.     Allergies  Avelox  Home Medications   Prior to Admission medications   Medication Sig Start Date End Date Taking? Authorizing Provider  albuterol (PROVENTIL HFA;VENTOLIN HFA) 108 (90 Base) MCG/ACT inhaler Inhale 2 puffs into the lungs every 6 (six) hours as needed for wheezing or shortness of breath. 08/26/15   Tammy S Parrett, NP  albuterol (PROVENTIL) (2.5 MG/3ML) 0.083% nebulizer solution 4 times daily; DX: J44.9 08/26/15   Tammy S Parrett, NP  ALPRAZolam (XANAX) 0.5 MG tablet Take 0.5 mg by mouth at bedtime as needed for sleep (help for TMJ).    Historical Provider, MD  budesonide-formoterol (SYMBICORT) 160-4.5 MCG/ACT inhaler Inhale 2 puffs into the lungs 2 (two) times daily. 08/26/15   Tammy S Parrett, NP  Cholecalciferol (VITAMIN D) 2000 UNITS CAPS Take 1 capsule by mouth daily.    Historical Provider, MD  guaiFENesin (MUCINEX) 600 MG 12 hr tablet Take 600 mg by  mouth daily as needed for cough or to loosen phlegm.    Historical Provider, MD  guaifenesin (ROBITUSSIN) 100 MG/5ML syrup Take 200 mg by mouth 3 (three) times daily as needed for cough.    Historical Provider, MD  ipratropium (ATROVENT) 0.02 % nebulizer solution Inhale 0.5 mg into the lungs as directed. 05/07/15   Historical Provider, MD  Multiple Vitamin (MULTIVITAMIN) tablet Take 2 tablets by mouth every morning.     Historical Provider, MD  tiotropium (SPIRIVA) 18 MCG inhalation capsule Place 1 capsule (18 mcg total) into inhaler and inhale daily. 08/26/15   Tammy S Parrett, NP   BP 141/70 mmHg  Pulse 114  Resp 26  Wt 141 lb (63.957 kg)  SpO2 100% Physical Exam  Constitutional: She is oriented to person, place, and time. She appears well-developed and  well-nourished. She appears distressed.  HENT:  Head: Normocephalic and atraumatic.  Cardiovascular: Regular rhythm.   No murmur heard. Tachycardic  Pulmonary/Chest:  Tachypnea with decreased air movement bilaterally, occasional end expiratory wheezes.  Abdominal: Soft. There is no tenderness. There is no rebound and no guarding.  Musculoskeletal: She exhibits no edema or tenderness.  2+ radial pulses in bilateral upper extremities  Neurological: She is alert and oriented to person, place, and time.  Skin: Skin is warm and dry.  Psychiatric:  Anxious  Nursing note and vitals reviewed.   ED Course  Procedures (including critical care time) Labs Review Labs Reviewed  COMPREHENSIVE METABOLIC PANEL - Abnormal; Notable for the following:    CO2 20 (*)    AST 44 (*)    All other components within normal limits  CBC WITH DIFFERENTIAL/PLATELET - Abnormal; Notable for the following:    RBC 3.77 (*)    MCV 105.8 (*)    MCH 35.0 (*)    All other components within normal limits  TROPONIN I    Imaging Review Ct Angio Chest Pe W/cm &/or Wo Cm  09/10/2015  CLINICAL DATA:  Shortness of breath, difficulty breathing, and mid chest pain since this morning, history COPD, asthma, GERD, former smoker EXAM: CT ANGIOGRAPHY CHEST WITH CONTRAST TECHNIQUE: Multidetector CT imaging of the chest was performed using the standard protocol during bolus administration of intravenous contrast. Multiplanar CT image reconstructions and MIPs were obtained to evaluate the vascular anatomy. CONTRAST:  12mL OMNIPAQUE IOHEXOL 350 MG/ML SOLN IV COMPARISON:  None FINDINGS: Aorta normal caliber without aneurysm or dissection. Scattered atherosclerotic calcifications aorta, proximal great vessels, and minimally in coronary arteries. LEFT subclavian pacemaker leads project at RIGHT atrium and RIGHT ventricle. Small pericardial effusion. Dilated central pulmonary arteries. Pulmonary arteries well opacified and patent. No  evidence of pulmonary embolism. Few scattered normal size mediastinal lymph nodes without thoracic adenopathy. Visualized upper abdomen grossly normal appearance. No acute osseous findings. Emphysematous changes greatest at the upper lobes. Mild central peribronchial thickening. Minimal subsegmental atelectasis in the lower lobes. Lungs otherwise clear without infiltrate, pleural effusion or pneumothorax. Review of the MIP images confirms the above findings. IMPRESSION: No evidence of pulmonary embolism. Enlarged central pulmonary arteries question pulmonary arterial hypertension. Scattered atherosclerotic changes. Small pericardial fusion. COPD changes. Electronically Signed   By: Lavonia Dana M.D.   On: 09/10/2015 16:01   Dg Chest Port 1 View  09/10/2015  CLINICAL DATA:  Chest pain, shortness of breath this morning EXAM: PORTABLE CHEST 1 VIEW COMPARISON:  Chest x-rays dated 05/28/2015 and 01/08/2015. FINDINGS: Left chest wall pacemaker/AICD appears stable in position. Heart size is normal. Overall cardiomediastinal silhouette is  stable in size and configuration. Perhaps mild scarring/ atelectasis at each lung base, unchanged. Lungs otherwise clear. No evidence of pneumonia. No pleural effusion. No pneumothorax seen. No acute osseous abnormality. IMPRESSION: Stable chest x-ray. No evidence of acute cardiopulmonary abnormality. Electronically Signed   By: Franki Cabot M.D.   On: 09/10/2015 14:58   I have personally reviewed and evaluated these images and lab results as part of my medical decision-making.   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  Pt very anxious on initial exam.  EKG with paced rhythm, no STEMI.  Pt initially treated for potential PE given her hx and sxs with heparin.  Treated with ASA, duoneb.   CT PE study negative for PE.  On repeat exam pt states her pain persists with deep breaths but is improved compared to earlier.  On repeat lung exam pt with improved air movement bilaterally with occasional rhonchi and crackles in the bases.  Pt's anxiety also appears to be improved.  Discussed with pt recommendation for admission and she declines at this time.  Plan to observe in the ED following repeat breathing treatment and troponin.  Pt care transferred pending repeat evaluation.      Quintella Reichert, MD 09/10/15 [phone]

## 2015-09-10 NOTE — ED Notes (Signed)
Patient transported to CT 

## 2015-09-10 NOTE — ED Notes (Signed)
Chest pain since this am. Sob. Hx of COPD.

## 2015-09-10 NOTE — ED Notes (Signed)
Patient transported to radiology department via stretcher.

## 2015-09-10 NOTE — Progress Notes (Signed)
ANTICOAGULATION CONSULT NOTE - Initial Consult  Pharmacy Consult for heparin Indication: pulmonary embolus  Allergies  Allergen Reactions  . Avelox [Moxifloxacin Hcl In Nacl] Itching and Rash    Patient Measurements: Weight: 141 lb (63.957 kg) Heparin Dosing Weight:   Vital Signs: Pulse Rate: 109 (01/26 1422)  Labs:  Recent Labs  09/10/15 1436  HGB 13.2  HCT 39.9  PLT 376    CrCl cannot be calculated (Patient has no serum creatinine result on file.).   Medical History: Past Medical History  Diagnosis Date  . COPD (chronic obstructive pulmonary disease) ([place])   . Asthma   . Allergic rhinitis   . 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 GERD (gastroesophageal reflux disease)   . Anxiety     "occasionally"    Medications:  See EMR  Assessment: Heparin gtt for PE, pt admitted with palpitations, chest pain and SOB. Pt is not on any anticoagulation PTA. CBC wnl.   Goal of Therapy:  Heparin level 0.3-0.7 units/ml Monitor platelets by anticoagulation protocol: Yes    Plan:  -Heparin 4000 units x1 then 1050 units/hr -Daily HL, CBC -Monitor s/sx bleeding    Harvel Quale 09/10/2015,3:02 PM

## 2015-09-10 NOTE — Telephone Encounter (Signed)
Spoke with pt and she states that at about 9AM this morning she started having CP. Pt states it is very "sore" in the center of her chest, feels worse when inhaling and she is SOB. Pt states that she had lightheadedness and dizziness yesterday. Pt states that she has had some high stress lately at home with some family issues. Pt rates her pain at a level 8 and states it is continuous. Vitals 141/74, 112. Advised pt to proceed to the ER for eval. Pt said she is willing to go to [facility]. Advised pt that if she goes there, they may transfer her to [facility] anyway. Pt verbalized understanding and stated that she will still go to [facility] because she feels that she just needs a chest xray and a breathing tx.

## 2015-09-19 ENCOUNTER — Emergency Department (HOSPITAL_BASED_OUTPATIENT_CLINIC_OR_DEPARTMENT_OTHER)
Admission: EM | Admit: 2015-09-19 | Discharge: 2015-09-19 | Disposition: A | Discharge status: Home / Self Care | Payer: PPO | Attending: Physician Assistant | Admitting: Physician Assistant

## 2015-09-19 ENCOUNTER — Emergency Department (HOSPITAL_BASED_OUTPATIENT_CLINIC_OR_DEPARTMENT_OTHER): Payer: PPO

## 2015-09-19 ENCOUNTER — Encounter (HOSPITAL_BASED_OUTPATIENT_CLINIC_OR_DEPARTMENT_OTHER): Payer: Self-pay | Admitting: *Deleted

## 2015-09-19 DIAGNOSIS — Z8719 Personal history of other diseases of the digestive system: Secondary | ICD-10-CM | POA: Diagnosis not present

## 2015-09-19 DIAGNOSIS — J449 Chronic obstructive pulmonary disease, unspecified: Secondary | ICD-10-CM | POA: Diagnosis not present

## 2015-09-19 DIAGNOSIS — F419 Anxiety disorder, unspecified: Secondary | ICD-10-CM | POA: Diagnosis not present

## 2015-09-19 DIAGNOSIS — Z87891 Personal history of nicotine dependence: Secondary | ICD-10-CM | POA: Insufficient documentation

## 2015-09-19 DIAGNOSIS — J40 Bronchitis, not specified as acute or chronic: Secondary | ICD-10-CM | POA: Diagnosis not present

## 2015-09-19 DIAGNOSIS — Z79899 Other long term (current) drug therapy: Secondary | ICD-10-CM | POA: Insufficient documentation

## 2015-09-19 DIAGNOSIS — Z8679 Personal history of other diseases of the circulatory system: Secondary | ICD-10-CM | POA: Diagnosis not present

## 2015-09-19 DIAGNOSIS — Z7951 Long term (current) use of inhaled steroids: Secondary | ICD-10-CM | POA: Insufficient documentation

## 2015-09-19 DIAGNOSIS — R079 Chest pain, unspecified: Secondary | ICD-10-CM | POA: Diagnosis not present

## 2015-09-19 DIAGNOSIS — Z950 Presence of cardiac pacemaker: Secondary | ICD-10-CM | POA: Diagnosis not present

## 2015-09-19 DIAGNOSIS — R071 Chest pain on breathing: Secondary | ICD-10-CM | POA: Diagnosis not present

## 2015-09-19 LAB — CBC
HEMATOCRIT: 40.4 % (ref 36.0–46.0)
HEMOGLOBIN: 13.4 g/dL (ref 12.0–15.0)
MCH: 34.4 pg — ABNORMAL HIGH (ref 26.0–34.0)
MCHC: 33.2 g/dL (ref 30.0–36.0)
MCV: 103.9 fL — ABNORMAL HIGH (ref 78.0–100.0)
Platelets: 386 10*3/uL (ref 150–400)
RBC: 3.89 MIL/uL (ref 3.87–5.11)
RDW: 12.5 % (ref 11.5–15.5)
WBC: 8.8 10*3/uL (ref 4.0–10.5)

## 2015-09-19 LAB — BASIC METABOLIC PANEL
ANION GAP: 11 (ref 5–15)
BUN: 16 mg/dL (ref 6–20)
CALCIUM: 9.7 mg/dL (ref 8.9–10.3)
CO2: 25 mmol/L (ref 22–32)
Chloride: 103 mmol/L (ref 101–111)
Creatinine, Ser: 0.83 mg/dL (ref 0.44–1.00)
GFR calc Af Amer: >60 mL/min (ref >60)
GLUCOSE: 83 mg/dL (ref 65–99)
POTASSIUM: 4.2 mmol/L (ref 3.5–5.1)
SODIUM: 139 mmol/L (ref 135–145)

## 2015-09-19 LAB — TROPONIN I: Troponin I: <0.03 ng/mL (ref <0.031)

## 2015-09-19 LAB — D-DIMER, QUANTITATIVE (NOT AT ARMC): D DIMER QUANT: 0.5 ug{FEU}/mL (ref 0.00–0.50)

## 2015-09-19 MED ORDER — ALBUTEROL SULFATE (2.5 MG/3ML) 0.083% IN NEBU
2.5000 mg | INHALATION_SOLUTION | Freq: Once | RESPIRATORY_TRACT | Status: AC
  Administered 2015-09-19: 2.5 mg via RESPIRATORY_TRACT
  Filled 2015-09-19: qty 3

## 2015-09-19 MED ORDER — PANTOPRAZOLE SODIUM 40 MG IV SOLR
40.0000 mg | Freq: Once | INTRAVENOUS | Status: AC
  Administered 2015-09-19: 40 mg via INTRAVENOUS
  Filled 2015-09-19: qty 40

## 2015-09-19 MED ORDER — ALBUTEROL SULFATE (2.5 MG/3ML) 0.083% IN NEBU
INHALATION_SOLUTION | RESPIRATORY_TRACT | Status: AC
  Administered 2015-09-19: 2.5 mg
  Filled 2015-09-19: qty 3

## 2015-09-19 MED ORDER — ACETAMINOPHEN 325 MG PO TABS
650.0000 mg | ORAL_TABLET | Freq: Once | ORAL | Status: AC
  Administered 2015-09-19: 650 mg via ORAL
  Filled 2015-09-19: qty 2

## 2015-09-19 MED ORDER — RANITIDINE HCL 150 MG/10ML PO SYRP
150.0000 mg | ORAL_SOLUTION | Freq: Once | ORAL | Status: AC
  Administered 2015-09-19: 150 mg via ORAL
  Filled 2015-09-19: qty 10

## 2015-09-19 MED ORDER — ALBUTEROL SULFATE (5 MG/ML) 0.5% IN NEBU: 2.5000 mg | INHALATION_SOLUTION | Freq: Once | RESPIRATORY_TRACT | Status: DC

## 2015-09-19 MED ORDER — GI COCKTAIL ~~LOC~~
30.0000 mL | Freq: Once | ORAL | Status: AC
  Administered 2015-09-19: 30 mL via ORAL
  Filled 2015-09-19: qty 30

## 2015-09-19 NOTE — ED Notes (Signed)
Pt states she also took tums and nexum w/o relief

## 2015-09-19 NOTE — ED Notes (Signed)
Pt placed on cont cardiac monitor with cont POX and int NBP q 10min

## 2015-09-19 NOTE — ED Notes (Signed)
MD at bedside. 

## 2015-09-19 NOTE — ED Notes (Signed)
Pt cont to c/o pain at left breast, EDP informed

## 2015-09-19 NOTE — ED Provider Notes (Signed)
CSN: [id number]     Arrival date & time 09/19/15  1443 History  By signing my name below, I, Evelene Croon, attest that this documentation has been prepared under the direction and in the presence of Daylan Juhnke Julio Alm, MD . Electronically Signed: Evelene Croon, Scribe. 09/19/2015. 3:48 PM.   Chief Complaint  Patient presents with  . Chest Pain    The history is provided by the patient and medical records. No language interpreter was used.    HPI Comments:  Jenna Marquez is a 70 y.o. female with a history of GERD,COPD,  pacemaker and AFIB, who presents to the Emergency Department complaining of CP that began ~ 0900 this AM. Her pain is located in her central chest and under her left breast with associated mild discomfort in her left shoulder. Pt notes her symptoms feel like indigestion. She has takenTums x 4, and pepto bismol without relief.  She denies h/o MI. Pt was evaluated by her cardiologist on 09/08/15 and had a CHADS VASC- 2 so no anticoagulation was started for her paroxsymal afib.  2:1 AV block was also noted during this visit.  Pt was also seen in the ED on 09/10/15 for CP and SOB. She had CTA that was negative for PE and was discharged home.   Past Medical History  Diagnosis Date  . COPD (chronic obstructive pulmonary disease) ([place])   . Asthma   . Allergic rhinitis   . 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 GERD (gastroesophageal reflux disease)   . Anxiety     "occasionally"   Past Surgical History  Procedure Laterality Date  . Ectopic pregnancy surgery  1974  . Tonsillectomy and adenoidectomy  1954  . Ep implantable device N/A 01/07/2015    Procedure: Pacemaker Implant;  Surgeon: Evans Lance, MD;  Location: [facility];  Service: Cardiovascular;  Laterality: N/A;  . Pacemaker insertion     Family History  Problem Relation Age of Onset  . Coronary artery disease Mother   . Heart disease Mother     CHF  . Diabetes Neg Hx   . Cancer Neg Hx   .  Stroke Neg Hx   . Colon cancer Neg Hx    Social History  Substance Use Topics  . Smoking status: Former Smoker -- 1.00 packs/day for 35 years    Types: Cigarettes    Quit date: 08/10/2012  . Smokeless tobacco: Never Used     Comment: Started at age 67  . Alcohol Use: 16.8 oz/week    28 Shots of liquor per week     Comment: 01/06/2015 "2 shot mixed drinks, 2/day"   OB History    No data available     Review of Systems  10 systems reviewed and all are negative for acute change except as noted in the HPI.   Allergies  Avelox  Home Medications   Prior to Admission medications   Medication Sig Start Date End Date Taking? Authorizing Provider  albuterol (PROVENTIL HFA;VENTOLIN HFA) 108 (90 Base) MCG/ACT inhaler Inhale 2 puffs into the lungs every 6 (six) hours as needed for wheezing or shortness of breath. 08/26/15   Tammy S Parrett, NP  albuterol (PROVENTIL) (2.5 MG/3ML) 0.083% nebulizer solution 4 times daily; DX: J44.9 08/26/15   Tammy S Parrett, NP  ALPRAZolam (XANAX) 0.5 MG tablet Take 0.5 mg by mouth at bedtime as needed for sleep (help for TMJ).    Historical Provider, MD  budesonide-formoterol [facility]) 160-4.5  MCG/ACT inhaler Inhale 2 puffs into the lungs 2 (two) times daily. 08/26/15   Tammy S Parrett, NP  Cholecalciferol (VITAMIN D) 2000 UNITS CAPS Take 1 capsule by mouth daily.    Historical Provider, MD  guaiFENesin (MUCINEX) 600 MG 12 hr tablet Take 600 mg by mouth daily as needed for cough or to loosen phlegm.    Historical Provider, MD  guaifenesin (ROBITUSSIN) 100 MG/5ML syrup Take 200 mg by mouth 3 (three) times daily as needed for cough.    Historical Provider, MD  ipratropium (ATROVENT) 0.02 % nebulizer solution Inhale 0.5 mg into the lungs as directed. 05/07/15   Historical Provider, MD  Multiple Vitamin (MULTIVITAMIN) tablet Take 2 tablets by mouth every morning.     Historical Provider, MD  tiotropium (SPIRIVA) 18 MCG inhalation capsule Place 1 capsule (18 mcg  total) into inhaler and inhale daily. 08/26/15   Tammy S Parrett, NP   BP 144/73 mmHg  Pulse 104  Temp(Src) 97.7 F (36.5 C) (Oral)  Resp 26  Ht 5\' 6"  (1.676 m)  Wt 140 lb (63.504 kg)  BMI 22.61 kg/m2  SpO2 97% Physical Exam  Constitutional: She is oriented to person, place, and time. She appears well-developed and well-nourished. No distress.  HENT:  Head: Normocephalic and atraumatic.  Mouth/Throat: Oropharynx is clear and moist.  Eyes: Conjunctivae are normal.  Cardiovascular: Normal rate, regular rhythm and normal heart sounds.   AICD left upper chest wall   Pulmonary/Chest: Effort normal and breath sounds normal. No respiratory distress.  Abdominal: She exhibits no distension.  Musculoskeletal: Normal range of motion. She exhibits no edema.  Moving all 4 extremities  Neurological: She is alert and oriented to person, place, and time.  Skin: Skin is warm and dry.  Psychiatric: She has a normal mood and affect.  Nursing note and vitals reviewed.   ED Course  Procedures   DIAGNOSTIC STUDIES:  Oxygen Saturation is 95% on RA, normal by my interpretation.    COORDINATION OF CARE:  3:08 PM Will order labs including Trop and CXR. Discussed treatment plan with pt at bedside and pt agrees.  Labs Review Labs Reviewed  CBC - Abnormal; Notable for the following:    MCV 103.9 (*)    MCH 34.4 (*)    All other components within normal limits  BASIC METABOLIC PANEL  TROPONIN I  TROPONIN I  D-DIMER, QUANTITATIVE (NOT AT [facility])    Imaging Review Dg Chest 2 View  09/19/2015  CLINICAL DATA:  70 y/o female here with c/o 'indigestion' or CP onset 4 hours. Also c/o SOB, chronic but worse today. Hx asthma, COPD, ex-smoker, pacemaker EXAM: CHEST  2 VIEW COMPARISON:  09/10/2015 FINDINGS: Left-sided transvenous pacemaker leads to the right atrium and right ventricle. The heart size is normal. There is bibasilar atelectasis or scarring. Perihilar bronchitic changes are present. There are  no focal consolidations. There is minimal left costophrenic angle blunting versus small effusion. IMPRESSION: 1. Bronchitic changes. 2. Bibasilar atelectasis or scarring. 3.  No evidence for acute pulmonary abnormality. Electronically Signed   By: Nolon Nations M.D.   On: 09/19/2015 16:02   I have personally reviewed and evaluated these images and lab results as part of my medical decision-making.   EKG Interpretation   Date/Time:  Saturday September 19 2015 14:56:38 EST Ventricular Rate:  203 PR Interval:    QRS Duration: 162 QT Interval:  240 QTC Calculation: 441 R Axis:   -96 Text Interpretation Left bundle branch block No significant  change since last tracing Confirmed by Gerald Leitz ([id number]) on  09/19/2015 3:03:06 PM      EKG Interpretation  Date/Time:  Saturday September 19 2015 17:11:34 EST Ventricular Rate:  103 PR Interval:  197 QRS Duration: 145 QT Interval:  383 QTC Calculation: 501 R Axis:   -33 Text Interpretation:  Sinus tachycardia Atrial premature complex Left bundle branch block Left bundle branch block No significant change since last tracing Confirmed by Gerald Leitz ([id number]) on 09/19/2015 5:41:54 PM        MDM   Final diagnoses:  None    Pt is a 51 year AA female with recent cardiology visit 3 weeks ago with parasozxymal afib, chadsvasc of 2 not on anticogaultion, ho arrthymia sp AICD presenting with symtpoms of indigestion.  Patient states the feelings of indigestion started rectally after eating ham salad on which crackers. She reports she's tried Tums 4, Pepto-Bismol, and vinegar and water. Patient reports she still has the feeling of indigestion is under her left breast goes up to her left shoulder. Given her age patient is moderate risk and heart score warrants a 4 hour troponin.  In the meantime we will treat her indigestion with GI cocktail and ranitidine.  I personally performed the services described in this documentation, which was  scribed in my presence. The recorded information has been reviewed and is accurate.  5:42 PM Patient had pain now below left breast- tender to palpation..  EKG unchanged. Appears very comfortable. NAD.  Trying alternative treatments for indigenstion/pain.  Will send d dimer since now patient states it is partially pleuritic.     8:38 PM 4 hour troponin negative. Pt has HEART score of 3. No htn, hld, dm or ho CAD, age 27. Will have her follow up in 48 hours with PCP.   Dacota Devall Julio Alm, MD 09/19/15 2038

## 2015-09-19 NOTE — ED Notes (Signed)
EDP informed of pts cont c/o chest discomfort

## 2015-09-19 NOTE — ED Notes (Signed)
Reports heartburn under her left breast after eating ham salad this morning- reports left arm pain. EKG completed and given to EDP to review

## 2015-09-19 NOTE — Discharge Instructions (Signed)
We are unsure what caused your indigestion vs chest pain today.  We are glad that your work up shows everything looks normal today. PLease follow up on Monday with your PCP.  Return if your chest pain or shortness of breath returns.   Chest Wall Pain Chest wall pain is pain in or around the bones and muscles of your chest. Sometimes, an injury causes this pain. Sometimes, the cause may not be known. This pain may take several weeks or longer to get better. HOME CARE INSTRUCTIONS  Pay attention to any changes in your symptoms. Take these actions to help with your pain:   Rest as told by your health care provider.   Avoid activities that cause pain. These include any activities that use your chest muscles or your abdominal and side muscles to lift heavy items.   If directed, apply ice to the painful area:  Put ice in a plastic bag.  Place a towel between your skin and the bag.  Leave the ice on for 20 minutes, 2-3 times per day.  Take over-the-counter and prescription medicines only as told by your health care provider.  Do not use tobacco products, including cigarettes, chewing tobacco, and e-cigarettes. If you need help quitting, ask your health care provider.  Keep all follow-up visits as told by your health care provider. This is important. SEEK MEDICAL CARE IF:  You have a fever.  Your chest pain becomes worse.  You have new symptoms. SEEK IMMEDIATE MEDICAL CARE IF:  You have nausea or vomiting.  You feel sweaty or light-headed.  You have a cough with phlegm (sputum) or you cough up blood.  You develop shortness of breath.   This information is not intended to replace advice given to you by your health care provider. Make sure you discuss any questions you have with your health care provider.   Document Released: 08/01/2005 Document Revised: 04/22/2015 Document Reviewed: 10/27/2014 Elsevier Interactive Patient Education [employer].

## 2015-09-19 NOTE — ED Notes (Signed)
Pt placed on automatic vitals Q30.  

## 2015-09-19 NOTE — ED Notes (Signed)
Dr. Thomasene Lot in to see pt, at [facility], family x2 present.

## 2015-09-19 NOTE — ED Notes (Signed)
Presents with left breast chest discomfort, radiates to left shoulder, pain onset approx 4 hours, pt states was sitting and watching TV, took nebulizer at home, other resp meds prior to arrival, no relief noted

## 2015-09-19 NOTE — ED Notes (Signed)
Patient transported to X-ray via stretcher, sr x 2 up, off cardiac monitor per EDP orders

## 2015-09-19 NOTE — ED Notes (Signed)
Family at bedside. 

## 2015-09-22 ENCOUNTER — Ambulatory Visit (INDEPENDENT_AMBULATORY_CARE_PROVIDER_SITE_OTHER): Payer: PPO | Admitting: Family Medicine

## 2015-09-22 ENCOUNTER — Encounter: Payer: Self-pay | Admitting: Family Medicine

## 2015-09-22 VITALS — BP 104/60 | HR 98 | Temp 97.4°F | Ht 66.0 in | Wt 138.0 lb

## 2015-09-22 DIAGNOSIS — Z23 Encounter for immunization: Secondary | ICD-10-CM

## 2015-09-22 DIAGNOSIS — R071 Chest pain on breathing: Secondary | ICD-10-CM

## 2015-09-22 MED ORDER — INFLUENZA VAC SPLIT QUAD 0.5 ML IM SUSY
0.5000 mL | PREFILLED_SYRINGE | Freq: Once | INTRAMUSCULAR | Status: AC
  Administered 2015-09-22: 0.5 mL via INTRAMUSCULAR

## 2015-09-22 NOTE — Assessment & Plan Note (Signed)
Recurrent issue for pt.  Resolved.  Suspect that this was GERD but pt feels it was musculoskeletal due to COPD cough.  Either way, she is asymptomatic today.  Lungs are clear, no cough in office.  Will hold on PPI at this time but if sxs persist, will need to revisit this idea.  Reviewed red flags that should prompt return to medical attention.  Pt expressed understanding and is in agreement w/ plan.

## 2015-09-22 NOTE — Progress Notes (Signed)
   Subjective:    Patient ID: Jenna Marquez, female    DOB: [birth year], 70 y.o.   MRN: [id number]  HPI ED f/u- pt went to ER on 2/4 w/ L sided CP.  Had negative troponin, CTA done 1/27 was negative for PE, D dimer on 2/4 was upper limit of normal.  EDP thought pt's sxs were GERD related.  Pt had Super Bowl Party on Sunday and woke w/ similar sxs on Monday.  Took 2 prednisone on Monday and reports sxs have resolved.  Denies current CP, SOB is at baseline- using nebs regularly per pulmonary.  No longer having pleuritic CP.   Review of Systems For ROS see HPI     Objective:   Physical Exam  Constitutional: She is oriented to person, place, and time. She appears well-developed and well-nourished. No distress.  HENT:  Head: Normocephalic and atraumatic.  Eyes: Conjunctivae and EOM are normal. Pupils are equal, round, and reactive to light.  Cardiovascular: Normal rate, regular rhythm, normal heart sounds and intact distal pulses.   Pulmonary/Chest: Effort normal and breath sounds normal. No respiratory distress. She has no wheezes. She has no rales. She exhibits no tenderness (ICD in place on L chest).  Neurological: She is alert and oriented to person, place, and time.  Skin: Skin is warm and dry. No erythema.  Vitals reviewed.         Assessment & Plan:

## 2015-09-22 NOTE — Progress Notes (Signed)
Pre visit review using our clinic review tool, if applicable. No additional management support is needed unless otherwise documented below in the visit note. 

## 2015-09-22 NOTE — Patient Instructions (Signed)
Follow up as scheduled for your complete physical Keep up the good work!  You look great! Call with any questions or concerns If you want to join [place] at the new [place] office, any scheduled appointments will automatically transfer and we will see you at [address] [place] [address] [place], [place], [place] [address] ([place]) [place] in there!!

## 2015-10-05 ENCOUNTER — Telehealth: Payer: Self-pay | Admitting: Pulmonary Disease

## 2015-10-05 MED ORDER — ALBUTEROL SULFATE (2.5 MG/3ML) 0.083% IN NEBU: INHALATION_SOLUTION | RESPIRATORY_TRACT | Status: DC

## 2015-10-05 MED ORDER — IPRATROPIUM BROMIDE 0.02 % IN SOLN: 0.5000 mg | Freq: Four times a day (QID) | RESPIRATORY_TRACT | Status: DC

## 2015-10-05 NOTE — Telephone Encounter (Signed)
Spoke with pt. She reports on 08/05/15 her pharmacy gave her duoneb for her nebulizer instead of the albuterol and ipratropium in separate vials.  She had to return this to the pharmacy to correct the mistake they did. She is now needing her albuterol RX and ipratropium RX sent into the pharmacy. I have done so. Nothing further needed

## 2015-10-07 ENCOUNTER — Emergency Department (HOSPITAL_BASED_OUTPATIENT_CLINIC_OR_DEPARTMENT_OTHER)
Admission: EM | Admit: 2015-10-07 | Discharge: 2015-10-07 | Disposition: A | Discharge status: Home / Self Care | Payer: PPO | Attending: Emergency Medicine | Admitting: Emergency Medicine

## 2015-10-07 ENCOUNTER — Encounter (HOSPITAL_BASED_OUTPATIENT_CLINIC_OR_DEPARTMENT_OTHER): Payer: Self-pay

## 2015-10-07 ENCOUNTER — Telehealth: Payer: Self-pay | Admitting: Cardiology

## 2015-10-07 ENCOUNTER — Emergency Department (HOSPITAL_BASED_OUTPATIENT_CLINIC_OR_DEPARTMENT_OTHER): Payer: PPO

## 2015-10-07 DIAGNOSIS — R079 Chest pain, unspecified: Secondary | ICD-10-CM

## 2015-10-07 DIAGNOSIS — Z950 Presence of cardiac pacemaker: Secondary | ICD-10-CM | POA: Insufficient documentation

## 2015-10-07 DIAGNOSIS — F419 Anxiety disorder, unspecified: Secondary | ICD-10-CM | POA: Diagnosis not present

## 2015-10-07 DIAGNOSIS — Z79899 Other long term (current) drug therapy: Secondary | ICD-10-CM | POA: Diagnosis not present

## 2015-10-07 DIAGNOSIS — Z8719 Personal history of other diseases of the digestive system: Secondary | ICD-10-CM | POA: Diagnosis not present

## 2015-10-07 DIAGNOSIS — J441 Chronic obstructive pulmonary disease with (acute) exacerbation: Secondary | ICD-10-CM | POA: Diagnosis not present

## 2015-10-07 DIAGNOSIS — Z87891 Personal history of nicotine dependence: Secondary | ICD-10-CM | POA: Insufficient documentation

## 2015-10-07 DIAGNOSIS — Z9889 Other specified postprocedural states: Secondary | ICD-10-CM | POA: Insufficient documentation

## 2015-10-07 DIAGNOSIS — Z7951 Long term (current) use of inhaled steroids: Secondary | ICD-10-CM | POA: Insufficient documentation

## 2015-10-07 DIAGNOSIS — R0789 Other chest pain: Secondary | ICD-10-CM | POA: Diagnosis not present

## 2015-10-07 DIAGNOSIS — R05 Cough: Secondary | ICD-10-CM | POA: Diagnosis not present

## 2015-10-07 LAB — CBC
HCT: 35.6 % — ABNORMAL LOW (ref 36.0–46.0)
HEMOGLOBIN: 12.1 g/dL (ref 12.0–15.0)
MCH: 35.5 pg — AB (ref 26.0–34.0)
MCHC: 34 g/dL (ref 30.0–36.0)
MCV: 104.4 fL — ABNORMAL HIGH (ref 78.0–100.0)
PLATELETS: 469 10*3/uL — AB (ref 150–400)
RBC: 3.41 MIL/uL — AB (ref 3.87–5.11)
RDW: 12.4 % (ref 11.5–15.5)
WBC: 7.6 10*3/uL (ref 4.0–10.5)

## 2015-10-07 LAB — TROPONIN I
Troponin I: <0.03 ng/mL (ref <0.031)
Troponin I: <0.03 ng/mL (ref <0.031)

## 2015-10-07 LAB — BASIC METABOLIC PANEL
ANION GAP: 14 (ref 5–15)
BUN: 11 mg/dL (ref 6–20)
CALCIUM: 9.3 mg/dL (ref 8.9–10.3)
CHLORIDE: 99 mmol/L — AB (ref 101–111)
CO2: 24 mmol/L (ref 22–32)
CREATININE: 0.81 mg/dL (ref 0.44–1.00)
GFR calc non Af Amer: >60 mL/min (ref >60)
Glucose, Bld: 92 mg/dL (ref 65–99)
Potassium: 3.8 mmol/L (ref 3.5–5.1)
SODIUM: 137 mmol/L (ref 135–145)

## 2015-10-07 MED ORDER — LORAZEPAM 2 MG/ML IJ SOLN
0.5000 mg | Freq: Once | INTRAMUSCULAR | Status: AC
  Administered 2015-10-07: 0.5 mg via INTRAVENOUS
  Filled 2015-10-07: qty 1

## 2015-10-07 MED ORDER — LEVALBUTEROL HCL 0.63 MG/3ML IN NEBU
0.6300 mg | INHALATION_SOLUTION | Freq: Once | RESPIRATORY_TRACT | Status: AC
  Administered 2015-10-07: 0.63 mg via RESPIRATORY_TRACT
  Filled 2015-10-07: qty 3

## 2015-10-07 MED ORDER — FENTANYL CITRATE (PF) 100 MCG/2ML IJ SOLN
12.5000 ug | Freq: Once | INTRAMUSCULAR | Status: AC
  Administered 2015-10-07: 12.5 ug via INTRAVENOUS
  Filled 2015-10-07: qty 2

## 2015-10-07 NOTE — ED Notes (Signed)
Pt in via [facility] - c/o left sided chest pain, radiation to left arm, lower extremity edema, pt with a pacemaker, - EMS reports patient v-paced @ 100-120. Associated shortness of breath, sweating. NO IV access in place. EMS administered Duoneb treatment for wheezing. Pt with history of COPD/asthma. Pt dyspneic. Pt reports left sided chest pain that is dull but worse on inspiration. Pt hypertensive. Pt very anxious. Pt states family on the way to ED. Denies history of PE/DVT.

## 2015-10-07 NOTE — Telephone Encounter (Signed)
Spoke with patient who c/o bilateral feet and ankle swelling, states worse in left foot.  She c/o pain under left breast pain radiating into shoulder and down left arm.  States does not notice worse SOB, has hx COPD and asthma.  She c/o "indigestion" that is not getting better with OTC antacid.  She denies nausea, diaphoresis.  She states she has gained weight in her abdomen over the past few weeks.  She was last seen by Dr. Curt Bears on 1/24 and did not have any of these complaints.  I advised her that she should go to [facility] ED for evaluation.  She is resistant to go to the hospital - she states she has trouble trusting health care providers at both [facility] and [facility]. She states her health care aide is at there with her and will drive her.  She states she is close to [facility] and insists on going there.  I advised her that I believe she needs evaluation in an ED due to her sudden onset of left-side radiating chest pain and fluid retention.  She verbalized understanding and agreement.

## 2015-10-07 NOTE — ED Provider Notes (Signed)
CSN: [id number]     Arrival date & time 10/07/15  1456 History   First MD Initiated Contact with Patient 10/07/15 1523     Chief Complaint  Patient presents with  . Chest Pain     (Consider location/radiation/quality/duration/timing/severity/associated sxs/prior Treatment) Patient is a 70 y.o. female presenting with chest pain. The history is provided by the patient and the EMS personnel.  Chest Pain Associated symptoms: shortness of breath   Associated symptoms: no abdominal pain, no fever, no headache, no nausea and not vomiting    the patient with onset of discomfort in the anterior part of the chest starting at 6 in the morning when she first awoke. She went to bed at 2 in the morning there was no discomfort. Patient's had some bilateral leg swelling on and off for a period of time. The chest pain does radiate to the left arm area. Patient was evaluated for similar complaint on January 26 and February 4 with negative workups. Patient has seen cardiology in January with interrogation of her pacemaker without any of acute findings other than some brief periods of atrial fibrillation that were rare. Brought in by EMS EMS felt that she was wheezing at home and gave her an albuterol Atrovent treatment in route.  Past Medical History  Diagnosis Date  . COPD (chronic obstructive pulmonary disease) ([place])   . Asthma   . Allergic rhinitis   . 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 GERD (gastroesophageal reflux disease)   . Anxiety     "occasionally"   Past Surgical History  Procedure Laterality Date  . Ectopic pregnancy surgery  1974  . Tonsillectomy and adenoidectomy  1954  . Ep implantable device N/A 01/07/2015    Procedure: Pacemaker Implant;  Surgeon: Evans Lance, MD;  Location: [facility];  Service: Cardiovascular;  Laterality: N/A;  . Pacemaker insertion     Family History  Problem Relation Age of Onset  . Coronary artery disease Mother   . Heart disease  Mother     CHF  . Diabetes Neg Hx   . Cancer Neg Hx   . Stroke Neg Hx   . Colon cancer Neg Hx    Social History  Substance Use Topics  . Smoking status: Former Smoker -- 1.00 packs/day for 35 years    Types: Cigarettes    Quit date: 08/10/2012  . Smokeless tobacco: Never Used     Comment: Started at age 51  . Alcohol Use: 16.8 oz/week    28 Shots of liquor per week     Comment: 01/06/2015 "2 shot mixed drinks, 2/day"   OB History    No data available     Review of Systems  Constitutional: Negative for fever.  HENT: Negative for congestion.   Eyes: Negative for visual disturbance.  Respiratory: Positive for shortness of breath.   Cardiovascular: Positive for chest pain.  Gastrointestinal: Negative for nausea, vomiting and abdominal pain.  Genitourinary: Negative for dysuria.  Musculoskeletal: Negative for myalgias.  Skin: Negative for rash.  Neurological: Negative for headaches.  Hematological: Does not bruise/bleed easily.  Psychiatric/Behavioral: Negative for confusion.      Allergies  Avelox  Home Medications   Prior to Admission medications   Medication Sig Start Date End Date Taking? Authorizing Provider  albuterol (PROVENTIL HFA;VENTOLIN HFA) 108 (90 Base) MCG/ACT inhaler Inhale 2 puffs into the lungs every 6 (six) hours as needed for wheezing or shortness of breath. 08/26/15  Yes Tammy  S Parrett, NP  albuterol (PROVENTIL) (2.5 MG/3ML) 0.083% nebulizer solution 4 times daily; DX: J44.9 10/05/15  Yes Rigoberto Noel, MD  ALPRAZolam Duanne Moron) 0.5 MG tablet Take 0.5 mg by mouth at bedtime as needed for sleep (help for TMJ).   Yes Historical Provider, MD  budesonide-formoterol (SYMBICORT) 160-4.5 MCG/ACT inhaler Inhale 2 puffs into the lungs 2 (two) times daily. 08/26/15  Yes Tammy S Parrett, NP  Cholecalciferol (VITAMIN D) 2000 UNITS CAPS Take 1 capsule by mouth daily.   Yes Historical Provider, MD  guaiFENesin (MUCINEX) 600 MG 12 hr tablet Take 600 mg by mouth daily as  needed for cough or to loosen phlegm.   Yes Historical Provider, MD  guaifenesin (ROBITUSSIN) 100 MG/5ML syrup Take 200 mg by mouth 3 (three) times daily as needed for cough.   Yes Historical Provider, MD  ipratropium (ATROVENT) 0.02 % nebulizer solution Inhale 2.5 mLs (0.5 mg total) into the lungs 4 (four) times daily. DX J44.9 10/05/15  Yes Rigoberto Noel, MD  Multiple Vitamin (MULTIVITAMIN) tablet Take 2 tablets by mouth every morning.    Yes Historical Provider, MD  tiotropium (SPIRIVA) 18 MCG inhalation capsule Place 1 capsule (18 mcg total) into inhaler and inhale daily. 08/26/15  Yes Tammy S Parrett, NP   BP 154/55 mmHg  Pulse 108  Temp(Src) 98 F (36.7 C) (Oral)  Resp 22  Ht 5\' 6"  (1.676 m)  Wt 62.143 kg  BMI 22.12 kg/m2  SpO2 96% Physical Exam  Constitutional: She is oriented to person, place, and time. She appears well-developed and well-nourished. No distress.  HENT:  Head: Normocephalic and atraumatic.  Mouth/Throat: Oropharynx is clear and moist.  Eyes: Conjunctivae and EOM are normal. Pupils are equal, round, and reactive to light.  Neck: Normal range of motion. Neck supple.  Cardiovascular: Normal rate, regular rhythm and normal heart sounds.   No murmur heard. Pulmonary/Chest: Effort normal and breath sounds normal. No respiratory distress.  Abdominal: Soft. Bowel sounds are normal. There is no tenderness.  Musculoskeletal: Normal range of motion.  Neurological: She is alert and oriented to person, place, and time. No cranial nerve deficit. She exhibits normal muscle tone. Coordination normal.  Skin: Skin is warm. No rash noted.  Nursing note and vitals reviewed.   ED Course  Procedures (including critical care time) Labs Review Labs Reviewed  BASIC METABOLIC PANEL - Abnormal; Notable for the following:    Chloride 99 (*)    All other components within normal limits  CBC - Abnormal; Notable for the following:    RBC 3.41 (*)    HCT 35.6 (*)    MCV 104.4 (*)     MCH 35.5 (*)    Platelets 469 (*)    All other components within normal limits  TROPONIN I  TROPONIN I    Imaging Review Dg Chest 2 View  10/07/2015  CLINICAL DATA:  70 year old female with chest and right arm pain. Cough. Initial encounter. COPD EXAM: CHEST  2 VIEW COMPARISON:  09/19/2015 and earlier. FINDINGS: Stable cardiac size and mediastinal contours. Mild pericardial effusion demonstrated in January by CTA. Stable left chest cardiac pacemaker. New small bilateral pleural effusions. Increased associated adjacent lung base opacity, favor atelectasis. No pneumothorax. Increased pulmonary vascularity without overt edema. Visualized tracheal air column is within normal limits. Osteopenia. No acute osseous abnormality identified. IMPRESSION: 1. New small bilateral pleural effusions with lung base atelectasis and increased pulmonary vascularity. No overt edema. 2. Stable cardiac silhouette since January when a small  pericardial effusion was demonstrated by CTA. Electronically Signed   By: Genevie Ann M.D.   On: 10/07/2015 15:44   I have personally reviewed and evaluated these images and lab results as part of my medical decision-making.   EKG Interpretation   Date/Time:  Wednesday October 07 2015 15:09:36 EST Ventricular Rate:  105 PR Interval:  174 QRS Duration: 148 QT Interval:  387 QTC Calculation: 511 R Axis:   -55 Text Interpretation:  Second degree AV block, Mobitz II Probable left  atrial enlargement Left bundle branch block suspect paced rhythm AV  dual-paced rhythm Confirmed by Myan Suit  MD, Cliffton Spradley ([id number]) on 10/07/2015  3:28:36 PM      MDM   Final diagnoses:  Chest pain, unspecified chest pain type    Patient's workup for the chest pain without evidence of an acute cardiac event. Patient has a pacemaker. Patient's rhythms in the past have been in the upper 90s today she's been in the low 100s but patient was given albuterol Atrovent treatment prior to arrival. Patient's  troponins are negative.  Patient's chest x-ray is negative. Other than some evidence of some small bilateral pleural effusions. Patient without any hypoxia. Doubt that she has a pulmonary embolus. Does have some bilateral lower extremity edema but patient states that she gets that at times. Patient has a pacemaker and rhythm reflex that. Patient seen in January by cardiology with review of her pacemaker function and it was fine. Patient was also seen for same complaint as today on February 4 and January 26  Patient appears stable for discharge home. No wheezing. Patient apparently was wheezing at home as per paramedics.  Patient stable for discharge home and follow-up with her doctors.    Fredia Sorrow, MD 10/07/15 1924

## 2015-10-07 NOTE — Discharge Instructions (Signed)
Return for any new or worse symptoms. Workup for the chest pain without any evidence of an acute cardiac event. If symptoms get worse return. May component to follow-up with your regular Dr. in heart doctor.

## 2015-10-07 NOTE — Telephone Encounter (Signed)
Pt c/o swelling: STAT is pt has developed SOB within 24 hours  1. How long have you been experiencing swelling? Last Night   2. Where is the swelling located?both feet and ankles   3.  Are you currently taking a "fluid pill"?no  4.  Are you currently SOB? COPD Asthma  5.  Have you traveled recently?no

## 2015-10-21 ENCOUNTER — Ambulatory Visit (INDEPENDENT_AMBULATORY_CARE_PROVIDER_SITE_OTHER): Payer: PPO | Admitting: *Deleted

## 2015-10-21 DIAGNOSIS — I442 Atrioventricular block, complete: Secondary | ICD-10-CM

## 2015-10-23 ENCOUNTER — Encounter: Payer: Self-pay | Admitting: Cardiology

## 2015-10-27 ENCOUNTER — Telehealth: Payer: Self-pay | Admitting: Cardiology

## 2015-10-27 DIAGNOSIS — I442 Atrioventricular block, complete: Secondary | ICD-10-CM | POA: Diagnosis not present

## 2015-10-27 NOTE — Telephone Encounter (Signed)
Spoke w/ pt and informed her that we did receive her remote transmission today. Pt verbalized understanding.

## 2015-10-27 NOTE — Telephone Encounter (Signed)
New message      Calling to let device know that she forgot to do a remote transmission so she did it today

## 2015-10-27 NOTE — Progress Notes (Signed)
Remote pacemaker transmission.   

## 2015-10-29 LAB — CUP PACEART REMOTE DEVICE CHECK
Battery Remaining Longevity: 126 mo
Brady Statistic AS VS Percent: 1 %
Date Time Interrogation Session: 20170314150637
Implantable Lead Implant Date: 20160525
Implantable Lead Location: 753859
Lead Channel Impedance Value: 514 Ohm
Lead Channel Impedance Value: 546 Ohm
Lead Channel Pacing Threshold Amplitude: 0.625 V
Lead Channel Setting Pacing Amplitude: 1.5 V
Lead Channel Setting Pacing Pulse Width: 0.4 ms
Lead Channel Setting Sensing Sensitivity: 5.6 mV
MDC IDC LEAD IMPLANT DT: 20160525
MDC IDC LEAD LOCATION: 753860
MDC IDC MSMT BATTERY IMPEDANCE: 111 Ohm
MDC IDC MSMT BATTERY VOLTAGE: 2.79 V
MDC IDC MSMT LEADCHNL RA PACING THRESHOLD AMPLITUDE: 0.625 V
MDC IDC MSMT LEADCHNL RA PACING THRESHOLD PULSEWIDTH: 0.4 ms
MDC IDC MSMT LEADCHNL RA SENSING INTR AMPL: 0.7 mV
MDC IDC MSMT LEADCHNL RV PACING THRESHOLD PULSEWIDTH: 0.4 ms
MDC IDC SET LEADCHNL RV PACING AMPLITUDE: 2.5 V
MDC IDC STAT BRADY AP VP PERCENT: 3 %
MDC IDC STAT BRADY AP VS PERCENT: 0 %
MDC IDC STAT BRADY AS VP PERCENT: 96 %

## 2015-10-30 ENCOUNTER — Encounter: Payer: Self-pay | Admitting: Cardiology

## 2015-11-02 ENCOUNTER — Encounter: Payer: PPO | Admitting: Family Medicine

## 2015-11-13 ENCOUNTER — Encounter: Payer: Self-pay | Admitting: Cardiology

## 2015-11-19 ENCOUNTER — Encounter: Payer: Self-pay | Admitting: Pulmonary Disease

## 2015-11-19 ENCOUNTER — Ambulatory Visit (INDEPENDENT_AMBULATORY_CARE_PROVIDER_SITE_OTHER): Payer: PPO | Admitting: Pulmonary Disease

## 2015-11-19 VITALS — BP 133/79 | HR 95 | Ht 66.5 in | Wt 140.0 lb

## 2015-11-19 DIAGNOSIS — J449 Chronic obstructive pulmonary disease, unspecified: Secondary | ICD-10-CM

## 2015-11-19 NOTE — Patient Instructions (Signed)
Use Symbicort twice daily Use Spiriva once daily-around 9 AM Okay to use albuterol nebs in a.m. on waking up- use otherwise only as needed  Stop taking ipratropium-since this is the same as Spiriva  Start exercise program  Spirometry on next visit

## 2015-11-19 NOTE — Progress Notes (Signed)
   Subjective:    Patient ID: Florentina Jenny, female    DOB: 14-Nov-1945, 70 y.o.   MRN: [id number]  HPI 70 yo female with COPD -GOLD 3 , former smoker .  Uses e cigs on/off  She smoked about 35 pack years until she quit in 2013 She has a pacemaker and good LV function on echo in 12/2014  11/19/2015  Chief Complaint  Patient presents with  . Follow-up    Breathing is okay, coughing a lot, seasonal allergies.   39m FU Admits to being hard headed - She has stopped using her Spiriva. She uses her Symbicort "as needed". She is good about using albuterol nebs 2-3 times daily. Morning is the worst time She had some problems with pharmacy when they gave her seemingly dual nebs instead of separate albuterol and Atrovent vials She has problems affording medications when she is in the donut hole She admits to a sedentary lifestyle  Significant tests/ events 09/2010 FeV1 49% Fef 25 75 22%   Ct chest 07/2012 - no nodules/copd changes   Past Medical History  Diagnosis Date  . COPD (chronic obstructive pulmonary disease) ([place])   . Asthma   . Allergic rhinitis   . 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 GERD (gastroesophageal reflux disease)   . Anxiety     "occasionally"      Review of Systems neg for any significant sore throat, dysphagia, itching, sneezing, nasal congestion or excess/ purulent secretions, fever, chills, sweats, unintended wt loss, pleuritic or exertional cp, hempoptysis, orthopnea pnd or change in chronic leg swelling.  Also denies presyncope, palpitations, heartburn, abdominal pain, nausea, vomiting, diarrhea or change in bowel or urinary habits, dysuria,hematuria, rash, arthralgias, visual complaints, headache, numbness weakness or ataxia.     Objective:   Physical Exam  Gen. Pleasant, well-nourished, in no distress ENT - no lesions, no post nasal drip Neck: No JVD, no thyromegaly, no carotid bruits Lungs: no use of accessory muscles, no dullness to  percussion, clear without rales or rhonchi  Cardiovascular: Rhythm regular, heart sounds  normal, no murmurs or gallops, no peripheral edema Musculoskeletal: No deformities, no cyanosis or clubbing        Assessment & Plan:

## 2015-11-19 NOTE — Assessment & Plan Note (Signed)
Use Symbicort twice daily Use Spiriva once daily-around 9 AM Okay to use albuterol nebs in a.m. on waking up- use otherwise only as needed  Stop taking ipratropium-since this is the same as Spiriva  Start exercise program  Spirometry on next visit

## 2015-11-23 ENCOUNTER — Ambulatory Visit (INDEPENDENT_AMBULATORY_CARE_PROVIDER_SITE_OTHER): Payer: PPO | Admitting: Family Medicine

## 2015-11-23 VITALS — BP 146/88 | HR 97 | Temp 97.3°F | Ht 66.0 in | Wt 139.4 lb

## 2015-11-23 DIAGNOSIS — Z0000 Encounter for general adult medical examination without abnormal findings: Secondary | ICD-10-CM

## 2015-11-23 DIAGNOSIS — D7589 Other specified diseases of blood and blood-forming organs: Secondary | ICD-10-CM

## 2015-11-23 DIAGNOSIS — Z13220 Encounter for screening for lipoid disorders: Secondary | ICD-10-CM

## 2015-11-23 DIAGNOSIS — Z5181 Encounter for therapeutic drug level monitoring: Secondary | ICD-10-CM

## 2015-11-23 DIAGNOSIS — Z119 Encounter for screening for infectious and parasitic diseases, unspecified: Secondary | ICD-10-CM

## 2015-11-23 NOTE — Progress Notes (Signed)
Pre visit review using our clinic review tool, if applicable. No additional management support is needed unless otherwise documented below in the visit note. 

## 2015-11-23 NOTE — Patient Instructions (Addendum)
You might try some generic loratadine (claritin) OTC for your seasonal allergies Please come in for a lab appointment while fasting in the next week or so to get your blood drawn  Please schedule a lab visit

## 2015-11-23 NOTE — Progress Notes (Signed)
[facility] [address], [place], [place] [address] [phone]  Date:  11/23/2015   Name:  Jenna Marquez   DOB:  03/19/1946   MRN:  [id number]  PCP:  Lamar Blinks, MD    Chief Complaint: Establish Care   History of Present Illness:  Jenna Marquez is a 70 y.o. very pleasant female patient who presents with the following:  History of COPD, former smoker, anxiety, CKD, pacemaker here today for a CPE She is s/p pneumovax over age 50 and alos prevnar, xostavax and flu UTD Colonoscopy 2014 mammo 10/16  Sees Dr. Elsworth Soho for pulmonology and Dr. 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   She quit smoking several years ago.  She does not go outdoors much as she will have allergy sx.  She had been on claritin in the past but ran out and did not refill. Reminded her that this is OTC  Admits that she was not feeling well this am so she "spiked my coffee" with liquor. Denies regular AM drinking and does not seem intoxicated.  However noted macrocytosis on recent CMP so wonder if she may have alcohol overuse   Wt Readings from Last 3 Encounters:  11/23/15 139 lb 6.4 oz (63.231 kg)  11/19/15 140 lb (63.504 kg)  10/07/15 137 lb (62.143 kg)      Patient Active Problem List   Diagnosis Date Noted  . Swelling of arm 05/22/2015  . Left shoulder pain 05/22/2015  . Chest pain on breathing 05/11/2015  . CKD (chronic kidney disease) stage 3, GFR 30-59 ml/min 05/11/2015  . Pacemaker 04/22/2015  . COPD exacerbation ([place]) 08/16/2013  . Generalized anxiety disorder 01/11/2012  . 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   . Tobacco abuse     Past Medical History  Diagnosis Date  . COPD (chronic obstructive pulmonary disease) ([place])   . Asthma   . Allergic  rhinitis   . 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 GERD (gastroesophageal reflux disease)   . Anxiety     "occasionally"    Past Surgical History  Procedure Laterality Date  . Ectopic pregnancy surgery  1974  . Tonsillectomy and adenoidectomy  1954  . Ep implantable device N/A 01/07/2015    Procedure: Pacemaker Implant;  Surgeon: Evans Lance, MD;  Location: [facility];  Service: Cardiovascular;  Laterality: N/A;  . Pacemaker insertion      Social History  Substance Use Topics  . Smoking status: Former Smoker -- 1.00 packs/day for 35 years    Types: Cigarettes    Quit date: 08/10/2012  . Smokeless tobacco: Never Used     Comment: Started at age 72  . Alcohol Use: 16.8 oz/week    28 Shots of liquor per week     Comment: 01/06/2015 "2 shot mixed drinks, 2/day"    Family History  Problem Relation Age of Onset  . Coronary artery disease Mother   . Heart disease Mother     CHF  . Diabetes Neg Hx   . Cancer Neg Hx   . Stroke Neg Hx   . Colon cancer Neg Hx     Allergies  Allergen Reactions  . Avelox [Moxifloxacin Hcl In Nacl] Itching and Rash  Medication list has been reviewed and updated.  Current Outpatient Prescriptions on File Prior to Visit  Medication Sig Dispense Refill  . albuterol (PROVENTIL HFA;VENTOLIN HFA) 108 (90 Base) MCG/ACT inhaler Inhale 2 puffs into the lungs every 6 (six) hours as needed for wheezing or shortness of breath. 1 Inhaler 2  . albuterol (PROVENTIL) (2.5 MG/3ML) 0.083% nebulizer solution 4 times daily; DX: J44.9 360 mL 1  . ALPRAZolam (XANAX) 0.5 MG tablet Take 0.5 mg by mouth at bedtime as needed for sleep (help for TMJ).    . B Complex Vitamins (B COMPLEX-B12 PO) Take by mouth.    . budesonide-formoterol (SYMBICORT) 160-4.5 MCG/ACT inhaler Inhale 2 puffs into the lungs 2 (two) times daily. 1 Inhaler 5  . calcium-vitamin D (OSCAL WITH D) 250-125 MG-UNIT tablet Take 1 tablet by mouth daily.    . Cholecalciferol  (VITAMIN D) 2000 UNITS CAPS Take 1 capsule by mouth daily.    Marland Kitchen guaiFENesin (MUCINEX) 600 MG 12 hr tablet Take 600 mg by mouth daily as needed for cough or to loosen phlegm.    Marland Kitchen guaifenesin (ROBITUSSIN) 100 MG/5ML syrup Take 200 mg by mouth 3 (three) times daily as needed for cough.    Marland Kitchen ipratropium (ATROVENT) 0.02 % nebulizer solution Inhale 2.5 mLs (0.5 mg total) into the lungs 4 (four) times daily. DX J44.9 360 mL 1  . Multiple Vitamin (MULTIVITAMIN) tablet Take 2 tablets by mouth every morning.     . tiotropium (SPIRIVA) 18 MCG inhalation capsule Place 1 capsule (18 mcg total) into inhaler and inhale daily. 30 capsule 5   No current facility-administered medications on file prior to visit.    Review of Systems:  As per HPI- otherwise negative.   Physical Examination:    Filed Vitals:   11/23/15 1039  BP: 146/88  Pulse: 97  Temp: 97.3 F (36.3 C)   Wt Readings from Last 3 Encounters:  11/23/15 139 lb 6.4 oz (63.231 kg)  11/19/15 140 lb (63.504 kg)  10/07/15 137 lb (62.143 kg)     GEN: WDWN, NAD, Non-toxic, A & O x 3, thin, looks well HEENT: Atraumatic, Normocephalic. Neck supple. No masses, No LAD.  Bilateral TM wnl, oropharynx normal.  PEERL,EOMI.   Ears and Nose: No external deformity. CV: RRR, No M/G/R. No JVD. No thrill. No extra heart sounds. PULM: CTA B, no wheezes, crackles, rhonchi. No retractions. No resp. distress. No accessory muscle use. ABD: S, NT, ND, +BS. No rebound. No HSM. EXTR: No c/c/e NEURO Normal gait.  PSYCH: Normally interactive. Conversant. Not depressed or anxious appearing.  Calm demeanor.  Breast: normal exam, no masses/ dimpling/ discharge   Assessment and Plan: Physical exam  Macrocytosis - Plan: B12, Folate  Screening for hyperlipidemia - Plan: Lipid panel  Medication monitoring encounter - Plan: Hepatic function panel  Screening examination for infectious disease - Plan: Hepatitis C antibody  Physical exam today She will RTC  for a lab visit in the next day or so Declines a tetanus shot today Noted macrocytosis so will check B12 and folate.  This may be due to alcohol overuse  Signed Lamar Blinks, MD

## 2015-11-24 ENCOUNTER — Telehealth: Payer: Self-pay

## 2015-11-24 ENCOUNTER — Other Ambulatory Visit: Payer: PPO

## 2015-11-24 NOTE — Telephone Encounter (Signed)
Patient unable to come in today due to diarrheal episode. Rescheduled for tomorrow at 7:45 am.

## 2015-11-25 ENCOUNTER — Other Ambulatory Visit (INDEPENDENT_AMBULATORY_CARE_PROVIDER_SITE_OTHER): Payer: PPO

## 2015-11-25 DIAGNOSIS — Z5181 Encounter for therapeutic drug level monitoring: Secondary | ICD-10-CM | POA: Diagnosis not present

## 2015-11-25 DIAGNOSIS — Z13220 Encounter for screening for lipoid disorders: Secondary | ICD-10-CM | POA: Diagnosis not present

## 2015-11-25 DIAGNOSIS — Z119 Encounter for screening for infectious and parasitic diseases, unspecified: Secondary | ICD-10-CM | POA: Diagnosis not present

## 2015-11-25 DIAGNOSIS — D7589 Other specified diseases of blood and blood-forming organs: Secondary | ICD-10-CM

## 2015-11-25 LAB — LIPID PANEL
CHOLESTEROL: 205 mg/dL — AB (ref 0–200)
HDL: 110.4 mg/dL (ref >39.00)
LDL CALC: 82 mg/dL (ref 0–99)
NonHDL: 94.78
TRIGLYCERIDES: 62 mg/dL (ref 0.0–149.0)
Total CHOL/HDL Ratio: 2
VLDL: 12.4 mg/dL (ref 0.0–40.0)

## 2015-11-25 LAB — HEPATIC FUNCTION PANEL
ALBUMIN: 3.8 g/dL (ref 3.5–5.2)
ALK PHOS: 112 U/L (ref 39–117)
ALT: 19 U/L (ref 0–35)
AST: 47 U/L — ABNORMAL HIGH (ref 0–37)
Bilirubin, Direct: 0.2 mg/dL (ref 0.0–0.3)
Total Bilirubin: 0.7 mg/dL (ref 0.2–1.2)
Total Protein: 7.1 g/dL (ref 6.0–8.3)

## 2015-11-25 LAB — FOLATE: Folate: 9.9 ng/mL (ref >5.9)

## 2015-11-25 LAB — VITAMIN B12: Vitamin B-12: 485 pg/mL (ref 211–911)

## 2015-11-25 LAB — HEPATITIS C ANTIBODY: HCV Ab: NEGATIVE

## 2015-11-27 ENCOUNTER — Encounter: Payer: PPO | Admitting: Family Medicine

## 2015-12-04 ENCOUNTER — Telehealth: Payer: Self-pay | Admitting: Family Medicine

## 2015-12-04 MED ORDER — ALPRAZOLAM 0.5 MG PO TABS: 0.5000 mg | ORAL_TABLET | Freq: Every evening | ORAL | Status: DC | PRN

## 2015-12-04 NOTE — Telephone Encounter (Signed)
See last OV note.  Reviewed NCCSR- no entries for this year.  Will refill #15

## 2015-12-04 NOTE — Telephone Encounter (Signed)
Relation to [id number] Call back number:[phone] Pharmacy: [id number] - [place], [place] - [address] AT [address]OSS [address] [phone] (Phone) [phone] (Fax)         Reason for call:  Patient requesting a refill ALPRAZolam (XANAX) 0.5 MG tablet

## 2016-01-26 ENCOUNTER — Ambulatory Visit (INDEPENDENT_AMBULATORY_CARE_PROVIDER_SITE_OTHER): Payer: PPO | Admitting: *Deleted

## 2016-01-26 ENCOUNTER — Telehealth: Payer: Self-pay | Admitting: Cardiology

## 2016-01-26 DIAGNOSIS — I442 Atrioventricular block, complete: Secondary | ICD-10-CM

## 2016-01-26 NOTE — Progress Notes (Signed)
Remote pacemaker transmission.   

## 2016-01-26 NOTE — Telephone Encounter (Signed)
Spoke with pt and reminded pt of remote transmission that is due today. Pt verbalized understanding.   

## 2016-01-28 LAB — CUP PACEART REMOTE DEVICE CHECK
Battery Impedance: 110 Ohm
Battery Remaining Longevity: 126 mo
Battery Voltage: 2.79 V
Brady Statistic AP VP Percent: 3 %
Date Time Interrogation Session: 20170613150031
Implantable Lead Implant Date: 20160525
Implantable Lead Implant Date: 20160525
Implantable Lead Location: 753859
Implantable Lead Model: 5076
Lead Channel Impedance Value: 493 Ohm
Lead Channel Sensing Intrinsic Amplitude: 0.7 mV
Lead Channel Setting Pacing Amplitude: 1.5 V
Lead Channel Setting Pacing Amplitude: 2.5 V
Lead Channel Setting Pacing Pulse Width: 0.4 ms
MDC IDC LEAD LOCATION: 753860
MDC IDC MSMT LEADCHNL RA PACING THRESHOLD AMPLITUDE: 0.625 V
MDC IDC MSMT LEADCHNL RA PACING THRESHOLD PULSEWIDTH: 0.4 ms
MDC IDC MSMT LEADCHNL RV IMPEDANCE VALUE: 534 Ohm
MDC IDC MSMT LEADCHNL RV PACING THRESHOLD AMPLITUDE: 0.625 V
MDC IDC MSMT LEADCHNL RV PACING THRESHOLD PULSEWIDTH: 0.4 ms
MDC IDC SET LEADCHNL RV SENSING SENSITIVITY: 5.6 mV
MDC IDC STAT BRADY AP VS PERCENT: 0 %
MDC IDC STAT BRADY AS VP PERCENT: 97 %
MDC IDC STAT BRADY AS VS PERCENT: 0 %

## 2016-02-03 ENCOUNTER — Encounter: Payer: Self-pay | Admitting: Cardiology

## 2016-02-23 ENCOUNTER — Other Ambulatory Visit: Payer: Self-pay | Admitting: Pulmonary Disease

## 2016-02-25 ENCOUNTER — Encounter: Payer: Self-pay | Admitting: Adult Health

## 2016-02-25 ENCOUNTER — Telehealth: Payer: Self-pay | Admitting: Adult Health

## 2016-02-25 ENCOUNTER — Ambulatory Visit (INDEPENDENT_AMBULATORY_CARE_PROVIDER_SITE_OTHER): Payer: PPO | Admitting: Adult Health

## 2016-02-25 VITALS — BP 136/74 | HR 96 | Temp 97.4°F | Ht 66.0 in | Wt 139.0 lb

## 2016-02-25 DIAGNOSIS — J449 Chronic obstructive pulmonary disease, unspecified: Secondary | ICD-10-CM | POA: Diagnosis not present

## 2016-02-25 MED ORDER — ALBUTEROL SULFATE (2.5 MG/3ML) 0.083% IN NEBU: INHALATION_SOLUTION | RESPIRATORY_TRACT | Status: DC

## 2016-02-25 MED ORDER — PREDNISONE 10 MG PO TABS: ORAL_TABLET | ORAL | Status: DC

## 2016-02-25 MED ORDER — IPRATROPIUM BROMIDE 0.02 % IN SOLN: 0.5000 mg | Freq: Four times a day (QID) | RESPIRATORY_TRACT | Status: DC

## 2016-02-25 MED ORDER — AZITHROMYCIN 250 MG PO TABS: ORAL_TABLET | ORAL | Status: DC

## 2016-02-25 MED ORDER — ALBUTEROL SULFATE HFA 108 (90 BASE) MCG/ACT IN AERS: 2.0000 | INHALATION_SPRAY | Freq: Four times a day (QID) | RESPIRATORY_TRACT | Status: DC | PRN

## 2016-02-25 MED ORDER — DOXYCYCLINE HYCLATE 100 MG PO TABS: 100.0000 mg | ORAL_TABLET | Freq: Two times a day (BID) | ORAL | Status: DC

## 2016-02-25 MED FILL — IPRATROPIUM BR 0.02% SOLN: 0.02 | 90 days supply | Qty: 900 | Fill #0

## 2016-02-25 MED FILL — PROVENTIL HFA 90 MCG INH: 108 (90 BAS | 30 days supply | Qty: 7 | Fill #0

## 2016-02-25 MED FILL — DOXYCYCLINE 100 MG TABLET: 100 | 7 days supply | Qty: 14 | Fill #0

## 2016-02-25 MED FILL — predniSONE 10 MG TABS: 10 | 8 days supply | Qty: 20 | Fill #0
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
## 2016-02-25 NOTE — Progress Notes (Signed)
Subjective:    Patient ID: Jenna Marquez, female    DOB: May 23, 1946, 70 y.o.   MRN: [id number]  HPI 70 yo female with COPD -GOLD 3 , former smoker .  Uses e cigs on/off   Significant tests/ events 09/2010 FeV1 49% Fef 25 75 22%   Ct chest 07/2012 - no nodules/copd changes  CTA Chest 08/2015 , neg PE, COPD changes   02/25/2016 Follow up : COPD   Pt presents for a 3 month follow up .  Says her breathing is doing worse for last 2 week. More cough and wheezing . Mucus is clear.  On Symbicort and Spirva . Wants to stop spiriva . Says it does not work as well as ipratropium , wants to go back to [employer] Four times a day .  She denies chest pain, orthopnea, edema , fever or hemoptysis .  PVX/Prevnar utd .  Spirometry today shows FEV1 46%, , ratio 35 , FVC 102%, mid flows 20%.       Past Medical History  Diagnosis Date  . COPD (chronic obstructive pulmonary disease) ([place])   . Asthma   . Allergic rhinitis   . 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 GERD (gastroesophageal reflux disease)   . Anxiety     "occasionally"    Current Outpatient Prescriptions on File Prior to Visit  Medication Sig Dispense Refill  . albuterol (PROVENTIL) (2.5 MG/3ML) 0.083% nebulizer solution 4 TIMES DAILY DX: J44.9 360 mL 5  . ALPRAZolam (XANAX) 0.5 MG tablet Take 1 tablet (0.5 mg total) by mouth at bedtime as needed for sleep (help for TMJ). Do not take with alcohol 15 tablet 0  . B Complex Vitamins (B COMPLEX-B12 PO) Take by mouth.    . budesonide-formoterol (SYMBICORT) 160-4.5 MCG/ACT inhaler Inhale 2 puffs into the lungs 2 (two) times daily. 1 Inhaler 5  . calcium-vitamin D (OSCAL WITH D) 250-125 MG-UNIT tablet Take 1 tablet by mouth daily.    . Cholecalciferol (VITAMIN D) 2000 UNITS CAPS Take 1 capsule by mouth daily.    Marland Kitchen guaiFENesin (MUCINEX) 600 MG 12 hr tablet Take 600 mg by mouth daily as needed for cough or to loosen phlegm.    Marland Kitchen guaifenesin (ROBITUSSIN) 100 MG/5ML syrup Take 200 mg by  mouth 3 (three) times daily as needed for cough.    Marland Kitchen ipratropium (ATROVENT) 0.02 % nebulizer solution Inhale 2.5 mLs (0.5 mg total) into the lungs 4 (four) times daily. DX J44.9 360 mL 1  . Multiple Vitamin (MULTIVITAMIN) tablet Take 2 tablets by mouth every morning.     Marland Kitchen albuterol (PROVENTIL HFA;VENTOLIN HFA) 108 (90 Base) MCG/ACT inhaler Inhale 2 puffs into the lungs every 6 (six) hours as needed for wheezing or shortness of breath. (Patient not taking: Reported on 02/25/2016) 1 Inhaler 2  . tiotropium (SPIRIVA) 18 MCG inhalation capsule Place 1 capsule (18 mcg total) into inhaler and inhale daily. (Patient not taking: Reported on 02/25/2016) 30 capsule 5   No current facility-administered medications on file prior to visit.       Review of Systems Constitutional:   No  weight loss, night sweats,  Fevers, chills,  +fatigue, or  lassitude.  HEENT:   No headaches,  Difficulty swallowing,  Tooth/dental problems, or  Sore throat,                No sneezing, itching, ear ache,  +nasal congestion, post nasal drip,   CV:  No chest pain,  Orthopnea,  PND, swelling in lower extremities, anasarca, dizziness, palpitations, syncope.   GI  No heartburn, indigestion, abdominal pain, nausea, vomiting, diarrhea, change in bowel habits, loss of appetite, bloody stools.   Resp:    No chest wall deformity  Skin: no rash or lesions.  GU: no dysuria, change in color of urine, no urgency or frequency.  No flank pain, no hematuria   MS:  No joint pain or swelling.  No decreased range of motion.  No back pain.  Psych:  No change in mood or affect. No depression or anxiety.  No memory loss.         Objective:   Physical Exam   Filed Vitals:   02/25/16 1005  BP: 136/74  Pulse: 96  Temp: 97.4 F (36.3 C)  TempSrc: Oral  Height: 5\' 6"  (1.676 m)  Weight: 139 lb (63.05 kg)  SpO2: 96%    GEN: A/Ox3; pleasant , NAD, chronically ill appearing , anxious  VS reviewed   HEENT:  [place]/AT,   EACs-clear, TMs-wnl, NOSE-clear, THROAT-clear, no lesions, no postnasal drip or exudate noted. , no stridor   NECK:  Supple w/ fair ROM; no JVD; normal carotid impulses w/o bruits; no thyromegaly or nodules palpated; no lymphadenopathy.  RESP  Few exp wheezing , , , no accessory muscle use, no dullness to percussion  CARD:  RRR, no m/r/g  , no peripheral edema, pulses intact, no cyanosis or clubbing.   GI:   Soft & nt; nml bowel sounds; no organomegaly or masses detected.  Musco: Warm bil, no deformities or joint swelling noted.   Neuro: alert, no focal deficits noted.    Skin: Warm, no lesions or rashes  TAMMY PARRETT, ANP-C  [facility]  02/25/2016

## 2016-02-25 NOTE — Patient Instructions (Addendum)
Prednisone taper over next week.  Continue on Symbicort 2 puffs Twice daily   Remain off Spiriva .  Use Albuterol and Ipratropium Neb Four times a day   May use Proventil Inhaler 2 puffs every 4hrs as needed for wheezing -this is your rescue medicine Follow up Dr. Elsworth Soho in 3 months and As needed   Please contact office for sooner follow up if symptoms do not improve or worsen or seek emergency care    Doxycycline to have on hold if symptoms worsen with discolored mucus .

## 2016-02-25 NOTE — Assessment & Plan Note (Signed)
Exacerbation  Change back to duoneb per pt request   Plan  Prednisone taper over next week.  Zpack to have on hold if symptoms worsen with discolored mucus.  Continue on Symbicort 2 puffs Twice daily   Use Albuterol and Ipratropium Neb Four times a day   May use Proventil Inhaler 2 puffs every 4hrs as needed for wheezing -this is your rescue medicine Follow up Dr. Elsworth Soho in 3 months and As needed   Please contact office for sooner follow up if symptoms do not improve or worsen or seek emergency care

## 2016-02-25 NOTE — Addendum Note (Signed)
Addended by: Mathis Dad on: 02/25/2016 10:31 AM   Modules accepted: Orders, Medications

## 2016-02-25 NOTE — Telephone Encounter (Signed)
i called the pharmacy and they will get her Albuterol HFA ready for a 3 month supply per the pts request.  Will forward back to TP

## 2016-02-25 NOTE — Telephone Encounter (Signed)
Pt with unacceptable language to staff. Despite multiple attempts to assist patient in her requests she remains very angry per staff. Will send to Dr. Elsworth Soho  For consideration of dismissal of pt from pulmonary practice.    Cc : Dennison Bulla -[facility]monary Director .

## 2016-02-25 NOTE — Telephone Encounter (Signed)
Pt hung up before we could speak with her Will route to TP who is calling patient

## 2016-02-25 NOTE — Telephone Encounter (Signed)
Called and spoke with pts pharmacy at [facility] in [facility] and they are aware to fill the rx that were sent in today for the quantity written for.  i called and lmom for the pt to make her aware of this.

## 2016-02-25 NOTE — Telephone Encounter (Signed)
Called pharmacy and spoke with [place], states that they received a rx for a zpak for pt, but pt was expecting doxycycline.  Both of these are mentioned in pt's office visit note- zpak in the note and doxycycline in the avs.  zpak was sent in.  TP please clarify on which medication the pt is to be on.  Thanks!

## 2016-02-26 ENCOUNTER — Other Ambulatory Visit: Payer: Self-pay

## 2016-02-26 DIAGNOSIS — J449 Chronic obstructive pulmonary disease, unspecified: Secondary | ICD-10-CM

## 2016-02-26 MED ORDER — ALBUTEROL SULFATE (2.5 MG/3ML) 0.083% IN NEBU: INHALATION_SOLUTION | RESPIRATORY_TRACT | Status: DC

## 2016-02-26 MED FILL — ALBUTEROL 0.083% INHAL SOLN: (2.5 MG/3ML | 88 days supply | Qty: 1050 | Fill #0

## 2016-02-26 NOTE — Telephone Encounter (Signed)
Spoke with pt, 90-day supply of albuterol hfa was called in to pharmacy, not 90-day supply of albuterol neb solution as requested.  Spoke with pharmacist at [facility], this has been fixed and a correct rx is on file.   Spoke with pt, she is aware of rx being ready.  Nothing further needed.

## 2016-03-01 ENCOUNTER — Telehealth: Payer: Self-pay | Admitting: *Deleted

## 2016-03-01 NOTE — Telephone Encounter (Signed)
Pt asked how callouses are removed. I explained to pt that the callouses are shaved down gently and that a medication may be added or there may be a lotion recommended for maintenance.  Pt states she wants an appt and I transferred her to schedulers.

## 2016-03-02 ENCOUNTER — Encounter: Payer: Self-pay | Admitting: Podiatry

## 2016-03-02 ENCOUNTER — Ambulatory Visit (INDEPENDENT_AMBULATORY_CARE_PROVIDER_SITE_OTHER): Payer: PPO | Admitting: Podiatry

## 2016-03-02 VITALS — BP 154/87 | HR 98 | Resp 18

## 2016-03-02 DIAGNOSIS — B351 Tinea unguium: Secondary | ICD-10-CM | POA: Diagnosis not present

## 2016-03-02 DIAGNOSIS — M79675 Pain in left toe(s): Secondary | ICD-10-CM

## 2016-03-02 DIAGNOSIS — Q828 Other specified congenital malformations of skin: Secondary | ICD-10-CM

## 2016-03-02 DIAGNOSIS — M79674 Pain in right toe(s): Secondary | ICD-10-CM | POA: Diagnosis not present

## 2016-03-02 MED ORDER — UREA 40 % EX CREA: 1.0000 "application " | TOPICAL_CREAM | Freq: Every day | CUTANEOUS | Status: DC

## 2016-03-02 NOTE — Progress Notes (Signed)
   Subjective:    Patient ID: Jenna Marquez, female    DOB: 1945-12-20, 70 y.o.   MRN: [id number]  HPI  70 year old female presents the office today for concerns of thick calluses to both of her feet. She is proceed going to spot to have the trimmed however the they are becoming more painful. She also has thick toenails are painful in shoe gear and pressure. Denies any redness or drainage along the toenail site. She has no other concerns today no other complaints.   Review of Systems  All other systems reviewed and are negative.      Objective:   Physical Exam General: AAO x3, NAD  Dermatological: Bilateral hyperkeratotic lesions present bilateral cemented metatarsal 1, bilateral some metatarsal 5, right plantar hallux and bilateral heels. Upon removal there is no underlying ulceration, drainage or other signs of infection. Nails are hypertrophic, dystrophic, brittle, discolored, elongated 10. No surrounding redness or drainage. Tenderness nails 1-5 bilaterally. No other open lesions or pre-ulcer lesions identified at this time.   Vascular: DP/PT 2/4, CRT less than 3 seconds. There is no pain with calf compression, swelling, warmth, erythema.   Neruologic: Grossly intact via light touch bilateral. Vibratory intact via tuning fork bilateral. Protective threshold with Semmes Wienstein monofilament intact to all pedal sites bilateral.  Musculoskeletal: No gross boney pedal deformities bilateral. No pain, crepitus, or limitation noted with foot and ankle range of motion bilateral. Muscular strength 5/5 in all groups tested bilateral.  Gait: Unassisted, Nonantalgic.      Assessment & Plan:   70 year old female bilateral symmetric hyperkeratotic lesions, onychomycosis  -Treatment options discussed including all alternatives, risks, and complications -Etiology of symptoms were discussed -Nails debrided 10 without couple complications or bleeding  -Hyperkeratotic lesions debrided 7 without  couple complications or bleeding  -Prescribed urea cream  -Follow-up in 3 months or sooner if needed   *Patient became upset in the office today as I was not applying lotion and washing her feet. Discussed with her this is not a spa.   Celesta Gentile, DPM

## 2016-03-09 NOTE — Telephone Encounter (Signed)
Spoke to TP & MG. Please have office admin (either TD or [place]) give the pt a warning that such behaviour will not be tolerated in the future. If this recurs, she will have to find another pulmonologist to care for her. Next appt with me

## 2016-03-11 NOTE — Telephone Encounter (Signed)
Spoke with pt on 03/11/16 as requested by Dr. Elsworth Soho.  Pt apologized for any upset caused to staff on 02/25/16.  She stated there was confusion with initial prescription and with pharmacy regarding filling of it.  She likes staff, Tammy Parrett, and Dr. Elsworth Soho and wants to continue to be seen by our practice in [facility].  Jenna Marquez

## 2016-03-12 ENCOUNTER — Other Ambulatory Visit: Payer: Self-pay | Admitting: Pulmonary Disease

## 2016-03-23 ENCOUNTER — Ambulatory Visit: Payer: Medicare Other | Admitting: Podiatry

## 2016-04-20 ENCOUNTER — Telehealth: Payer: Self-pay | Admitting: Pulmonary Disease

## 2016-04-20 NOTE — Telephone Encounter (Signed)
Spoke with Lenna Sciara, she said that all of the [employer] are retail only, private pay only and cost over $300.  Advised patient that she would have to self pay and advised her that it would probably be cheaper if she were to get nebulizer online.  Patient advised that she would get the machine online.  Nothing further needed.
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
## 2016-04-26 ENCOUNTER — Telehealth: Payer: Self-pay | Admitting: Cardiology

## 2016-04-26 ENCOUNTER — Encounter: Payer: PPO | Admitting: *Deleted

## 2016-04-26 NOTE — Telephone Encounter (Signed)
LMOVM reminding pt to send remote transmission.   

## 2016-04-28 ENCOUNTER — Other Ambulatory Visit: Payer: Self-pay | Admitting: Family Medicine

## 2016-04-29 ENCOUNTER — Encounter: Payer: Self-pay | Admitting: Cardiology

## 2016-04-29 ENCOUNTER — Other Ambulatory Visit: Payer: Self-pay | Admitting: Family Medicine

## 2016-04-29 ENCOUNTER — Other Ambulatory Visit: Payer: Self-pay | Admitting: Emergency Medicine

## 2016-04-29 MED ORDER — ALPRAZOLAM 0.5 MG PO TABS: 0.5000 mg | ORAL_TABLET | Freq: Every evening | ORAL | 0 refills | Status: DC | PRN

## 2016-04-29 NOTE — Telephone Encounter (Signed)
Relation to [id number] Call back number: [phone] Pharmacy: [id number] - [place], [place] - [address] AT [address]  Reason for call:  Patient checking on the status of ALPRAZolam (XANAX) 0.5 MG tablet medication request. Patient would like to know why medication was cut in half. Please advise

## 2016-04-29 NOTE — Telephone Encounter (Signed)
Faxed refill rx for [facility] to [facility].

## 2016-04-29 NOTE — Progress Notes (Signed)
Letter  

## 2016-04-29 NOTE — Telephone Encounter (Signed)
Spoke to pt. Refill request sent to doc of the day (Dr.Wendling). Pt would like a call back from [name] to discuss why her Xanax rx was changed from 30 pills to 78. Pt states that she would like to discuss this over the phone and not on my chart.

## 2016-04-29 NOTE — Telephone Encounter (Signed)
Received refill request for ALPRAZOLAM 0.5 MG TABLET. Last office visit 11/23/2015 and last refill 12/04/15. Is it ok to refill? Please advise.

## 2016-04-29 NOTE — Telephone Encounter (Signed)
Called her and LMOM- her refill was only 14 pills probably because it was written by someone other than her PCP, also we have not seen her in about 6 months.  Asked her to come and see me sooon

## 2016-05-03 ENCOUNTER — Ambulatory Visit (INDEPENDENT_AMBULATORY_CARE_PROVIDER_SITE_OTHER): Payer: PPO | Admitting: *Deleted

## 2016-05-03 DIAGNOSIS — I442 Atrioventricular block, complete: Secondary | ICD-10-CM | POA: Diagnosis not present

## 2016-05-04 ENCOUNTER — Encounter: Payer: Self-pay | Admitting: Cardiology

## 2016-05-04 NOTE — Progress Notes (Signed)
Remote pacemaker transmission.   

## 2016-05-06 ENCOUNTER — Other Ambulatory Visit: Payer: Self-pay | Admitting: Family Medicine

## 2016-05-06 ENCOUNTER — Encounter: Payer: Self-pay | Admitting: Family Medicine

## 2016-05-06 DIAGNOSIS — Z1231 Encounter for screening mammogram for malignant neoplasm of breast: Secondary | ICD-10-CM

## 2016-05-06 NOTE — Telephone Encounter (Signed)
Received refill request for ibuprofen 800 MG. Last office visit 11/23/15. Will it be ok to refill? Please advise.

## 2016-05-20 ENCOUNTER — Encounter: Payer: Self-pay | Admitting: Cardiology

## 2016-05-23 LAB — CUP PACEART REMOTE DEVICE CHECK
Battery Impedance: 111 Ohm
Battery Remaining Longevity: 127 mo
Battery Voltage: 2.79 V
Implantable Lead Implant Date: 20160525
Implantable Lead Location: 753859
Implantable Lead Model: 5076
Lead Channel Impedance Value: 515 Ohm
Lead Channel Setting Pacing Amplitude: 1.5 V
Lead Channel Setting Pacing Amplitude: 2.5 V
Lead Channel Setting Pacing Pulse Width: 0.4 ms
MDC IDC LEAD IMPLANT DT: 20160525
MDC IDC LEAD LOCATION: 753860
MDC IDC MSMT LEADCHNL RA PACING THRESHOLD AMPLITUDE: 0.5 V
MDC IDC MSMT LEADCHNL RA PACING THRESHOLD PULSEWIDTH: 0.4 ms
MDC IDC MSMT LEADCHNL RA SENSING INTR AMPL: 0.7 mV
MDC IDC MSMT LEADCHNL RV IMPEDANCE VALUE: 555 Ohm
MDC IDC MSMT LEADCHNL RV PACING THRESHOLD AMPLITUDE: 0.625 V
MDC IDC MSMT LEADCHNL RV PACING THRESHOLD PULSEWIDTH: 0.4 ms
MDC IDC SESS DTM: 20170919141413
MDC IDC SET LEADCHNL RV SENSING SENSITIVITY: 5.6 mV
MDC IDC STAT BRADY AP VP PERCENT: 3 %
MDC IDC STAT BRADY AP VS PERCENT: 0 %
MDC IDC STAT BRADY AS VP PERCENT: 97 %
MDC IDC STAT BRADY AS VS PERCENT: 0 %

## 2016-05-25 DIAGNOSIS — H2513 Age-related nuclear cataract, bilateral: Secondary | ICD-10-CM | POA: Diagnosis not present

## 2016-06-09 ENCOUNTER — Ambulatory Visit (INDEPENDENT_AMBULATORY_CARE_PROVIDER_SITE_OTHER): Payer: PPO | Admitting: Pulmonary Disease

## 2016-06-09 ENCOUNTER — Encounter: Payer: Self-pay | Admitting: Pulmonary Disease

## 2016-06-09 ENCOUNTER — Ambulatory Visit (HOSPITAL_BASED_OUTPATIENT_CLINIC_OR_DEPARTMENT_OTHER): Payer: PPO

## 2016-06-09 VITALS — BP 130/68 | HR 97 | Ht 66.0 in | Wt 141.0 lb

## 2016-06-09 DIAGNOSIS — J449 Chronic obstructive pulmonary disease, unspecified: Secondary | ICD-10-CM

## 2016-06-09 NOTE — Progress Notes (Signed)
   Subjective:    Patient ID: Jenna Marquez, female    DOB: 1945-09-02, 70 y.o.   MRN: [id number]  HPI   This patient was upset because of the wait and left without being seen   Review of Systems     Objective:   Physical Exam        Assessment & Plan:

## 2016-06-10 ENCOUNTER — Other Ambulatory Visit: Payer: Self-pay | Admitting: Adult Health

## 2016-06-10 ENCOUNTER — Ambulatory Visit (HOSPITAL_BASED_OUTPATIENT_CLINIC_OR_DEPARTMENT_OTHER): Payer: PPO

## 2016-06-10 MED ORDER — BUDESONIDE-FORMOTEROL FUMARATE 160-4.5 MCG/ACT IN AERO: 2.0000 | INHALATION_SPRAY | Freq: Two times a day (BID) | RESPIRATORY_TRACT | 1 refills | Status: DC

## 2016-06-14 ENCOUNTER — Ambulatory Visit (HOSPITAL_BASED_OUTPATIENT_CLINIC_OR_DEPARTMENT_OTHER): Payer: PPO

## 2016-06-15 ENCOUNTER — Ambulatory Visit: Payer: PPO | Admitting: Adult Health

## 2016-06-16 ENCOUNTER — Ambulatory Visit: Payer: PPO | Admitting: Adult Health

## 2016-06-20 ENCOUNTER — Ambulatory Visit (INDEPENDENT_AMBULATORY_CARE_PROVIDER_SITE_OTHER): Payer: PPO | Admitting: Adult Health

## 2016-06-20 ENCOUNTER — Ambulatory Visit: Payer: PPO | Admitting: Adult Health

## 2016-06-20 ENCOUNTER — Ambulatory Visit (HOSPITAL_BASED_OUTPATIENT_CLINIC_OR_DEPARTMENT_OTHER)
Admission: RE | Admit: 2016-06-20 | Discharge: 2016-06-20 | Disposition: A | Discharge status: Home / Self Care | Payer: PPO | Source: Ambulatory Visit | Attending: Family Medicine | Admitting: Family Medicine

## 2016-06-20 ENCOUNTER — Encounter: Payer: Self-pay | Admitting: Adult Health

## 2016-06-20 DIAGNOSIS — Z1231 Encounter for screening mammogram for malignant neoplasm of breast: Secondary | ICD-10-CM | POA: Diagnosis not present

## 2016-06-20 DIAGNOSIS — J449 Chronic obstructive pulmonary disease, unspecified: Secondary | ICD-10-CM | POA: Diagnosis not present

## 2016-06-20 NOTE — Assessment & Plan Note (Signed)
Stable without flare   Plan Patient Instructions  Continue on Symbicort 2 puffs Twice daily   Use Albuterol and Ipratropium Neb Four times a day   May use Proventil Inhaler 2 puffs every 4hrs as needed for wheezing -this is your rescue medicine Follow up Dr. Elsworth Soho in 1 year and  As needed   Please contact office for sooner follow up if symptoms do not improve or worsen or seek emergency care  Get your flu shot at your pharmacy .

## 2016-06-20 NOTE — Progress Notes (Signed)
Subjective:    Patient ID: Jenna Marquez, female    DOB: Sep 18, 1945, 70 y.o.   MRN: [id number]  HPI 70 yo female with COPD -GOLD 3 , former smoker .  Uses e cigs on/off   Significant tests/ events 09/2010 FeV1 49% Fef 25 75 22%   Ct chest 07/2012 - no nodules/copd changes  CTA Chest 08/2015 , neg PE, COPD changes  Spirometry 02/2016 shows FEV1 46%, , ratio 35 , FVC 102%, mid flows 20%.      06/20/2016 Follow up : COPD   Pt presents for a 3 month follow up .  Says overall breathing is doing okay. No flare of cough or wheezing .  Gets winded at times-chronic. Feels she is doing better.  She denies chest pain, orthopnea, edema , fever or hemoptysis .  PVX/Prevnar utd .       Past Medical History:  Diagnosis Date  . Allergic rhinitis   . Anxiety    "occasionally"  . Asthma   . COPD (chronic obstructive pulmonary disease) ([name])   . GERD (gastroesophageal reflux disease)   . Mobitz type 2 second degree AV block 01/06/2015    Current Outpatient Prescriptions on File Prior to Visit  Medication Sig Dispense Refill  . albuterol (PROVENTIL HFA;VENTOLIN HFA) 108 (90 Base) MCG/ACT inhaler Inhale 2 puffs into the lungs every 6 (six) hours as needed for wheezing or shortness of breath. 1 Inhaler 2  . albuterol (PROVENTIL) (2.5 MG/3ML) 0.083% nebulizer solution 4 TIMES DAILY DX: J44.9 1080 mL 0  . ALPRAZolam (XANAX) 0.5 MG tablet Take 1 tablet (0.5 mg total) by mouth at bedtime as needed for sleep (help for TMJ). Do not take with alcohol 15 tablet 0  . B Complex Vitamins (B COMPLEX-B12 PO) Take by mouth.    . budesonide-formoterol (SYMBICORT) 160-4.5 MCG/ACT inhaler Inhale 2 puffs into the lungs 2 (two) times daily. 3 Inhaler 1  . calcium-vitamin D (OSCAL WITH D) 250-125 MG-UNIT tablet Take 1 tablet by mouth daily.    . Cholecalciferol (VITAMIN D) 2000 UNITS CAPS Take 1 capsule by mouth daily.    Marland Kitchen guaiFENesin (MUCINEX) 600 MG 12 hr tablet Take 600 mg by mouth daily as needed for cough or  to loosen phlegm.    Marland Kitchen guaifenesin (ROBITUSSIN) 100 MG/5ML syrup Take 200 mg by mouth 3 (three) times daily as needed for cough.    Marland Kitchen ibuprofen (ADVIL,MOTRIN) 800 MG tablet TAKE 1 TABLET BY MOUTH EVERY 8 HOURS AS NEEDED 60 tablet 0  . ipratropium (ATROVENT) 0.02 % nebulizer solution Inhale 2.5 mLs (0.5 mg total) into the lungs 4 (four) times daily. DX J44.9 360 mL 1  . Multiple Vitamin (MULTIVITAMIN) tablet Take 2 tablets by mouth every morning.      No current facility-administered medications on file prior to visit.        Review of Systems Constitutional:   No  weight loss, night sweats,  Fevers, chills,  +fatigue, or  lassitude.  HEENT:   No headaches,  Difficulty swallowing,  Tooth/dental problems, or  Sore throat,                No sneezing, itching, ear ache,  +nasal congestion, post nasal drip,   CV:  No chest pain,  Orthopnea, PND, swelling in lower extremities, anasarca, dizziness, palpitations, syncope.   GI  No heartburn, indigestion, abdominal pain, nausea, vomiting, diarrhea, change in bowel habits, loss of appetite, bloody stools.   Resp:    No chest  wall deformity  Skin: no rash or lesions.  GU: no dysuria, change in color of urine, no urgency or frequency.  No flank pain, no hematuria   MS:  No joint pain or swelling.  No decreased range of motion.  No back pain.  Psych:  No change in mood or affect. No depression or anxiety.  No memory loss.         Objective:   Physical Exam   Vitals:   06/20/16 1544  BP: 118/60  Pulse: 93  Temp: 97.9 F (36.6 C)  TempSrc: Oral  SpO2: 96%  Weight: 141 lb (64 kg)  Height: 5\' 6"  (1.676 m)    GEN: A/Ox3; pleasant , NAD, chronically ill appearing ,    HEENT:  /AT,  EACs-clear, TMs-wnl, NOSE-clear, THROAT-clear, no lesions, no postnasal drip or exudate noted. , no stridor   NECK:  Supple w/ fair ROM; no JVD; normal carotid impulses w/o bruits; no thyromegaly or nodules palpated; no lymphadenopathy.    RESP   CTA w/ no wheezing , , , no accessory muscle use, no dullness to percussion  CARD:  RRR, no m/r/g  , no peripheral edema, pulses intact, no cyanosis or clubbing.   GI:   Soft & nt; nml bowel sounds; no organomegaly or masses detected.   Musco: Warm bil, no deformities or joint swelling noted.   Neuro: alert, no focal deficits noted.    Skin: Warm, no lesions or rashes  TAMMY PARRETT, ANP-C   Pulmonary and Critical Care  06/20/2016

## 2016-06-20 NOTE — Patient Instructions (Signed)
Continue on Symbicort 2 puffs Twice daily   Use Albuterol and Ipratropium Neb Four times a day   May use Proventil Inhaler 2 puffs every 4hrs as needed for wheezing -this is your rescue medicine Follow up Dr. Elsworth Soho in 1 year and  As needed   Please contact office for sooner follow up if symptoms do not improve or worsen or seek emergency care  Get your flu shot at your pharmacy .

## 2016-06-30 ENCOUNTER — Ambulatory Visit: Payer: PPO | Admitting: Adult Health

## 2016-07-03 ENCOUNTER — Emergency Department (HOSPITAL_BASED_OUTPATIENT_CLINIC_OR_DEPARTMENT_OTHER)
Admission: EM | Admit: 2016-07-03 | Discharge: 2016-07-03 | Disposition: A | Discharge status: Home / Self Care | Payer: PPO | Attending: Emergency Medicine | Admitting: Emergency Medicine

## 2016-07-03 ENCOUNTER — Encounter (HOSPITAL_BASED_OUTPATIENT_CLINIC_OR_DEPARTMENT_OTHER): Payer: Self-pay | Admitting: Emergency Medicine

## 2016-07-03 DIAGNOSIS — D171 Benign lipomatous neoplasm of skin and subcutaneous tissue of trunk: Secondary | ICD-10-CM | POA: Diagnosis not present

## 2016-07-03 DIAGNOSIS — M5412 Radiculopathy, cervical region: Secondary | ICD-10-CM | POA: Diagnosis not present

## 2016-07-03 DIAGNOSIS — M542 Cervicalgia: Secondary | ICD-10-CM | POA: Diagnosis not present

## 2016-07-03 DIAGNOSIS — J449 Chronic obstructive pulmonary disease, unspecified: Secondary | ICD-10-CM | POA: Insufficient documentation

## 2016-07-03 DIAGNOSIS — D1779 Benign lipomatous neoplasm of other sites: Secondary | ICD-10-CM | POA: Diagnosis not present

## 2016-07-03 DIAGNOSIS — Z87891 Personal history of nicotine dependence: Secondary | ICD-10-CM | POA: Diagnosis not present

## 2016-07-03 DIAGNOSIS — J45909 Unspecified asthma, uncomplicated: Secondary | ICD-10-CM | POA: Insufficient documentation

## 2016-07-03 NOTE — ED Triage Notes (Signed)
Patient reports right sided neck pain which began on the left side of her neck.  States that the pain has resolved on the left side but persists on the right.  States she has been taking ibuprofen and using heating pads without relief.

## 2016-07-03 NOTE — ED Notes (Signed)
Pt why no nurse or doctor had come in to see her yet, she had been in room approx. 91mins she stated. I informed Pt that I had come in to complete the EKG and vitals as they were orders placed. Pt refused EKG and vitals had already been taken at Triage. She stated she wanted an X-Ray of her neck. I told Pt that RN Wille Glaser, was with his other Pts and would be in to see her in approx. 64mins. Pt stated she comes here because we are fast unlike [facility]. I told Pt that we an ER dept and that everyone is working hard to get to everyone as fast as they can. RN Wille Glaser informed and so was Equities trader.

## 2016-07-03 NOTE — ED Provider Notes (Signed)
[facility] Provider Note: Georgena Spurling, MD, FACEP  CSN: [id number] MRN: [id number] ARRIVAL: 07/03/16 at 2220 ROOM: MH07/MH07  By signing my name below, I, Julien Nordmann, attest that this documentation has been prepared under the direction and in the presence of Shanon Rosser, MD.  Electronically Signed: Julien Nordmann, ED Scribe. 07/03/16. 11:13 PM.  CHIEF COMPLAINT  Neck Pain   HISTORY OF PRESENT ILLNESS  Jenna Marquez is a 70 y.o. female with a PMhx of COPD and cervical radiculopathy who presents to the Emergency Department for right sided neck pain that started one week ago. The pain has been waxing and waning and was worst last night. Pain is moderate presently. Pt says the pain radiates upward to the right side of her head and and down into her right shoulder. She describes her pain as sharp. Pt has been using her heating pad and applying alcohol to alleviate her pain without adequate relief. Pt says she took two hydrocodone last night with some relief but they made her nauseated when she took them on an empty stomach. Her pain is worse with rotation of her head to the right or extension posteriorly and is worse at night. Pt was seen in January for the same symptoms and was diagnosed with cervical radiculopathy. She denies radiation of pain to the right upper extremity nor does she have any numbness or weakness of the right upper extremity.  Past Medical History:  Diagnosis Date  . Allergic rhinitis   . Anxiety    "occasionally"  . Asthma   . COPD (chronic obstructive pulmonary disease) ([place])   . GERD (gastroesophageal reflux disease)   . Mobitz type 2 second degree AV block 01/06/2015    Past Surgical History:  Procedure Laterality Date  . [place]  . EP IMPLANTABLE DEVICE N/A 01/07/2015   Procedure: Pacemaker Implant;  Surgeon: Evans Lance, MD;  Location: [facility];  Service: Cardiovascular;  Laterality: N/A;  . PACEMAKER  INSERTION    . TONSILLECTOMY AND ADENOIDECTOMY  1954    Family History  Problem Relation Age of Onset  . Coronary artery disease Mother   . Heart disease Mother     CHF  . Diabetes Neg Hx   . Cancer Neg Hx   . Stroke Neg Hx   . Colon cancer Neg Hx     Social History  Substance Use Topics  . Smoking status: Former Smoker    Packs/day: 1.00    Years: 35.00    Types: Cigarettes    Quit date: 08/10/2012  . Smokeless tobacco: Never Used     Comment: Started at age 89  . Alcohol use 16.8 oz/week    28 Shots of liquor per week     Comment: 01/06/2015 "2 shot mixed drinks, 2/day"    Prior to Admission medications   Medication Sig Start Date End Date Taking? Authorizing Provider  albuterol (PROVENTIL HFA;VENTOLIN HFA) 108 (90 Base) MCG/ACT inhaler Inhale 2 puffs into the lungs every 6 (six) hours as needed for wheezing or shortness of breath. 02/25/16   Tammy S Parrett, NP  albuterol (PROVENTIL) (2.5 MG/3ML) 0.083% nebulizer solution 4 TIMES DAILY DX: J44.9 02/26/16   Tammy S Parrett, NP  ALPRAZolam (XANAX) 0.5 MG tablet Take 1 tablet (0.5 mg total) by mouth at bedtime as needed for sleep (help for TMJ). Do not take with alcohol 04/29/16   Shelda Pal, DO  B Complex Vitamins (B COMPLEX-B12 PO) Take  by mouth.    Historical Provider, MD  budesonide-formoterol (SYMBICORT) 160-4.5 MCG/ACT inhaler Inhale 2 puffs into the lungs 2 (two) times daily. 06/10/16   Tammy S Parrett, NP  calcium-vitamin D (OSCAL WITH D) 250-125 MG-UNIT tablet Take 1 tablet by mouth daily.    Historical Provider, MD  Cholecalciferol (VITAMIN D) 2000 UNITS CAPS Take 1 capsule by mouth daily.    Historical Provider, MD  guaiFENesin (MUCINEX) 600 MG 12 hr tablet Take 600 mg by mouth daily as needed for cough or to loosen phlegm.    Historical Provider, MD  guaifenesin (ROBITUSSIN) 100 MG/5ML syrup Take 200 mg by mouth 3 (three) times daily as needed for cough.    Historical Provider, MD  ibuprofen (ADVIL,MOTRIN)  800 MG tablet TAKE 1 TABLET BY MOUTH EVERY 8 HOURS AS NEEDED 05/06/16   Gay Filler Copland, MD  ipratropium (ATROVENT) 0.02 % nebulizer solution Inhale 2.5 mLs (0.5 mg total) into the lungs 4 (four) times daily. DX J44.9 02/25/16   Tammy S Parrett, NP  Multiple Vitamin (MULTIVITAMIN) tablet Take 2 tablets by mouth every morning.     Historical Provider, MD    Allergies Avelox [moxifloxacin hcl in nacl]   REVIEW OF SYSTEMS  Negative except as noted here or in the History of Present Illness.   PHYSICAL EXAMINATION  Initial Vital Signs Blood pressure 164/70, pulse 87, resp. rate 20, height 5\' 6"  (1.676 m), weight 141 lb (64 kg), SpO2 98 %.  Examination General: Well-developed, well-nourished female in no acute distress; appearance consistent with age of record HENT: normocephalic; atraumatic Eyes: pupils equal, round and pinpoint; extraocular muscles intact; arcus senilis bilaterally Neck: supple; tenderness on right side of neck behind the sternocleidomastoideus; pain reproduced with rotation to the right and retroflexion of the head Heart: regular rate and rhythm Lungs: faint expiratory wheezes Abdomen: soft; nondistended; nontender; bowel sounds present Extremities: Arthritic changes; full range of motion; pulses normal Neurologic: Awake, alert and oriented; motor function intact in all extremities and symmetric; sensation intact and symmetric in the upper extremities; no facial droop Skin: Warm and dry; rubbery, non tender mass inferior to her right shoulder Psychiatric: Normal mood and affect   RESULTS  Summary of this visit's results, reviewed by myself:   EKG Interpretation  Date/Time:    Ventricular Rate:    PR Interval:    QRS Duration:   QT Interval:    QTC Calculation:   R Axis:     Text Interpretation:        Laboratory Studies: No results found for this or any previous visit (from the past 24 hour(s)). Imaging Studies: No results found.  ED COURSE  Nursing  notes and initial vitals signs, including pulse oximetry, reviewed.  Vitals:   07/03/16 2223  BP: 164/70  Pulse: 87  Resp: 20  SpO2: 98%  Weight: 141 lb (64 kg)  Height: 5\' 6"  (1.676 m)    PROCEDURES    ED DIAGNOSES     ICD-9-CM ICD-10-CM   1. Cervical radiculopathy 723.4 M54.12   2. 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 The recorded information has been reviewed and is accurate.    Shanon Rosser, MD 07/03/16 [id number]

## 2016-07-03 NOTE — ED Notes (Signed)
Pt refused EKG and stated that she has a pacemaker, RN Jon informed

## 2016-07-03 NOTE — ED Notes (Signed)
Dr. Molpus into room, at BS. 

## 2016-07-03 NOTE — ED Notes (Addendum)
C/o R lateral neck & supraclavicular pain, has been trying heat with minimal temporary relief, worse with movement, turning head, and palpation, h/o COPD and frequent cough, pacemaker present, (denies: CP, sob, nvd, fever, dizziness, radiation down either arm or other sx), heating pad/sticker in place. Alert, NAD, calm, interactive, resps e/u speaking in clear complete sentences, no dyspnea noted, skin W&D.

## 2016-07-14 DIAGNOSIS — T1511XA Foreign body in conjunctival sac, right eye, initial encounter: Secondary | ICD-10-CM | POA: Diagnosis not present

## 2016-07-22 IMAGING — DX DG CHEST 2V
2 series · 3 of 3 positions shown · non-contrast
Comparison: 09/10/2015

CLINICAL DATA: 69 y/o female here with c/o 'indigestion' or CP
onset 4 hours. Also c/o SOB, chronic but worse today. Hx asthma,
COPD, ex-smoker, pacemaker

EXAM:
CHEST  2 VIEW

[Series 1: chest pa · 0.14mm/px · 2 of 2 slices shown]
[im 1/2]
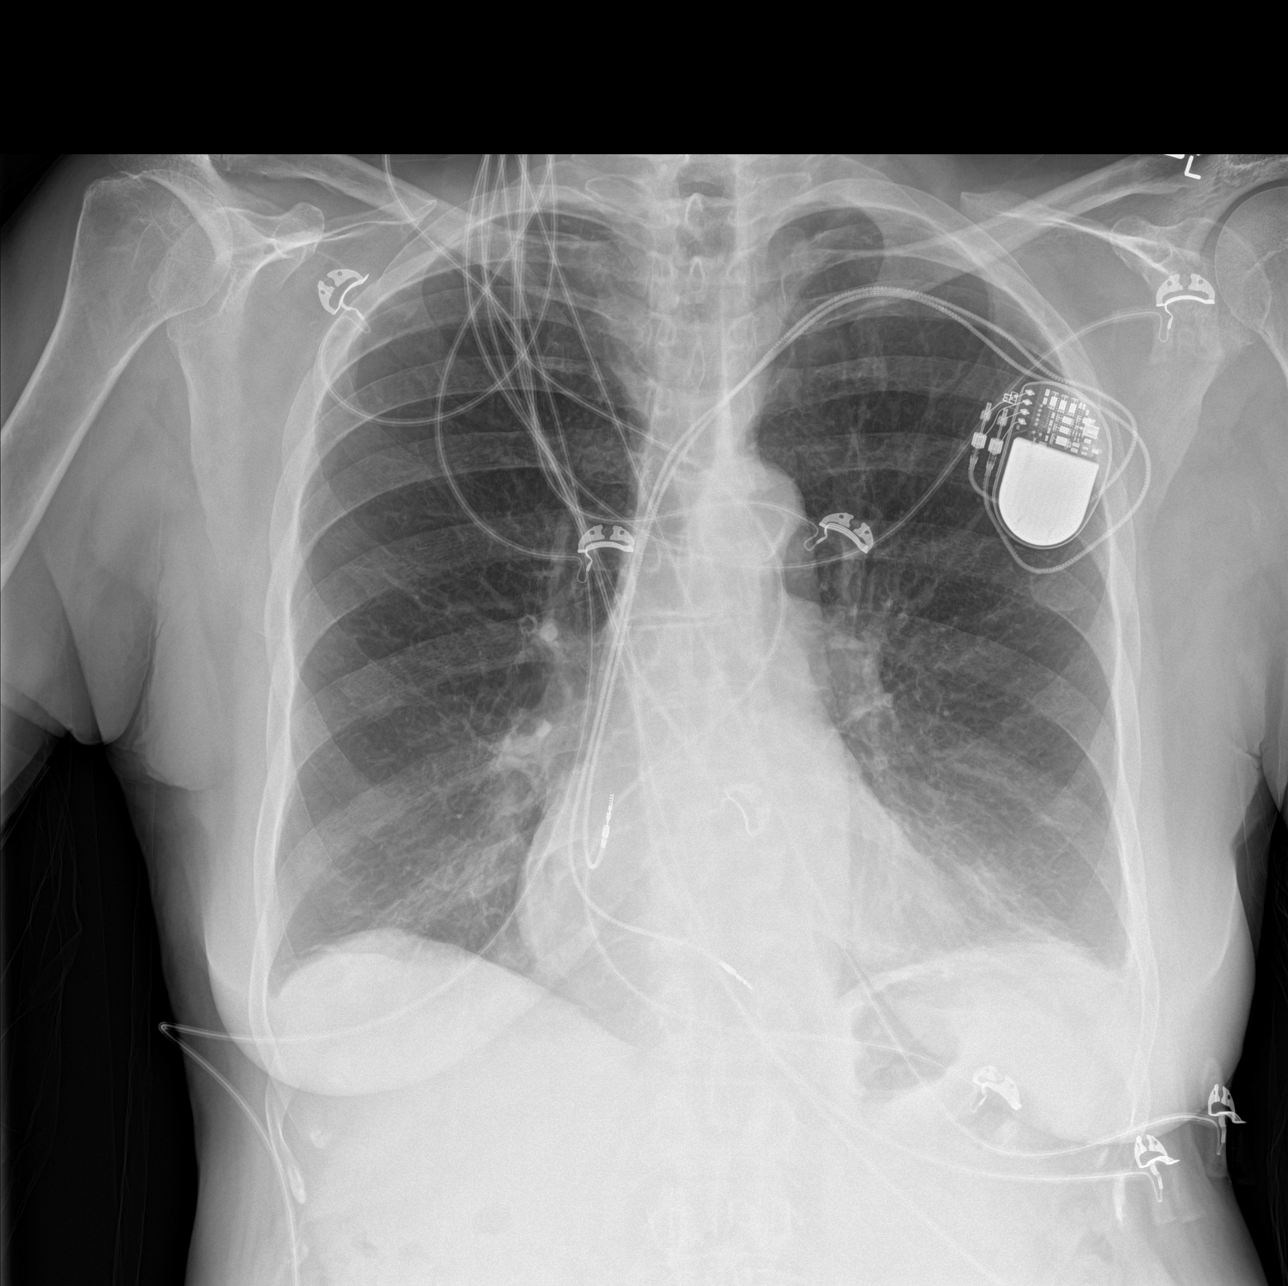
[im 2/2]
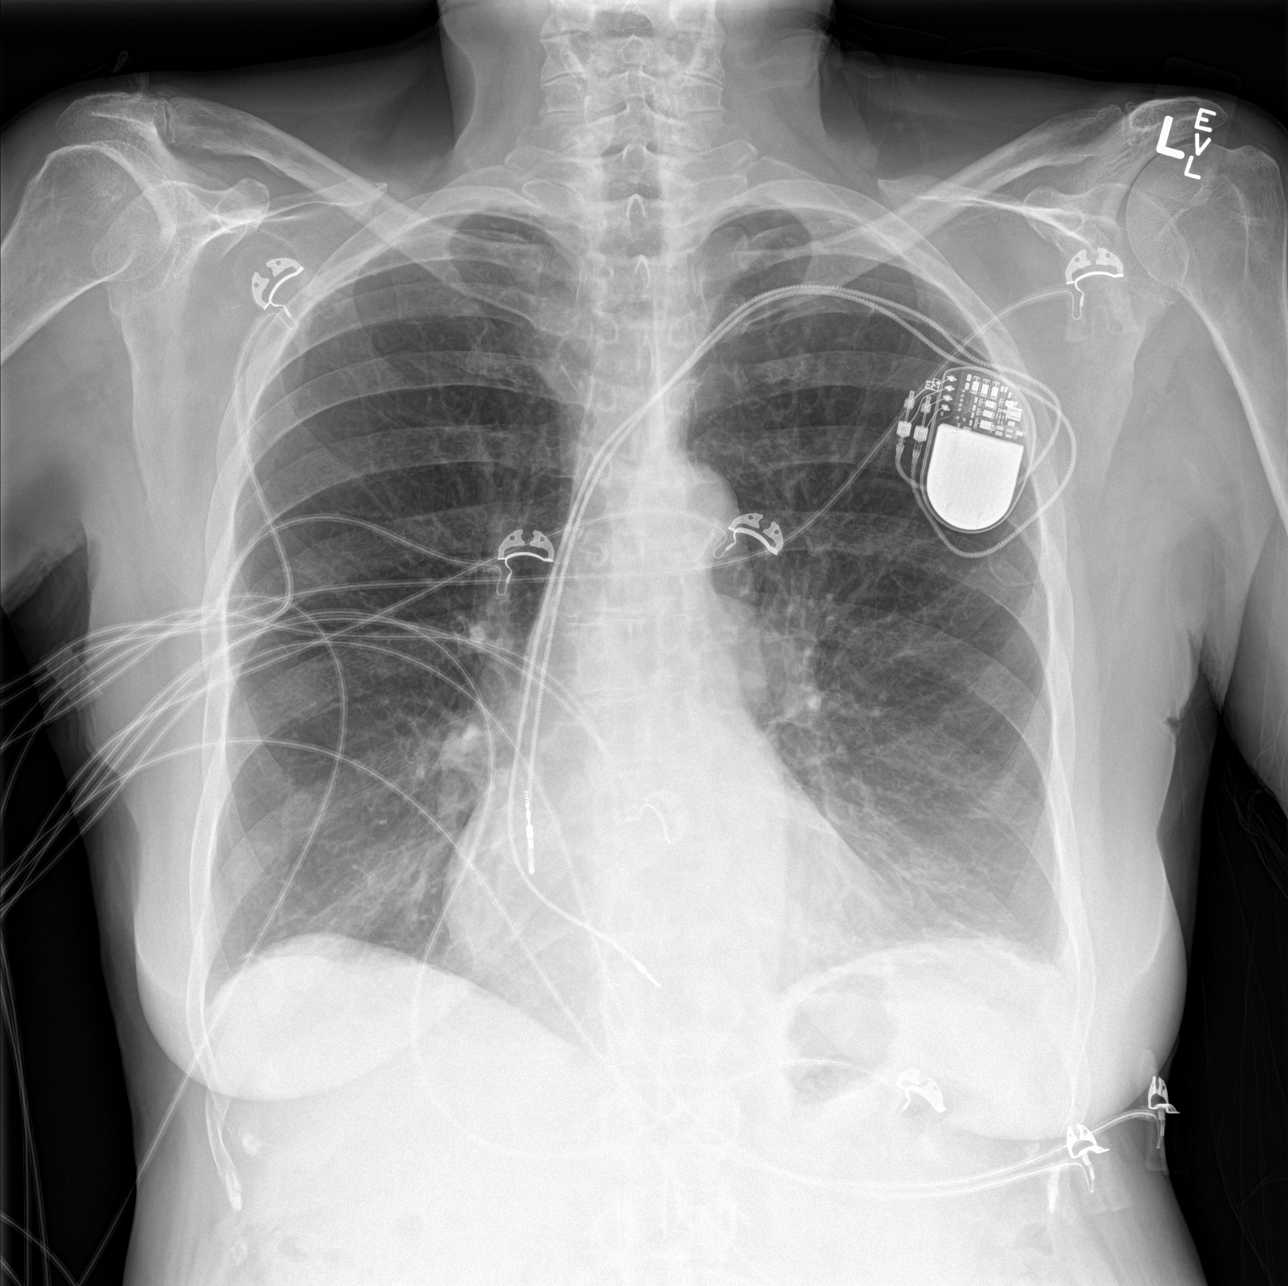

[chest lat]
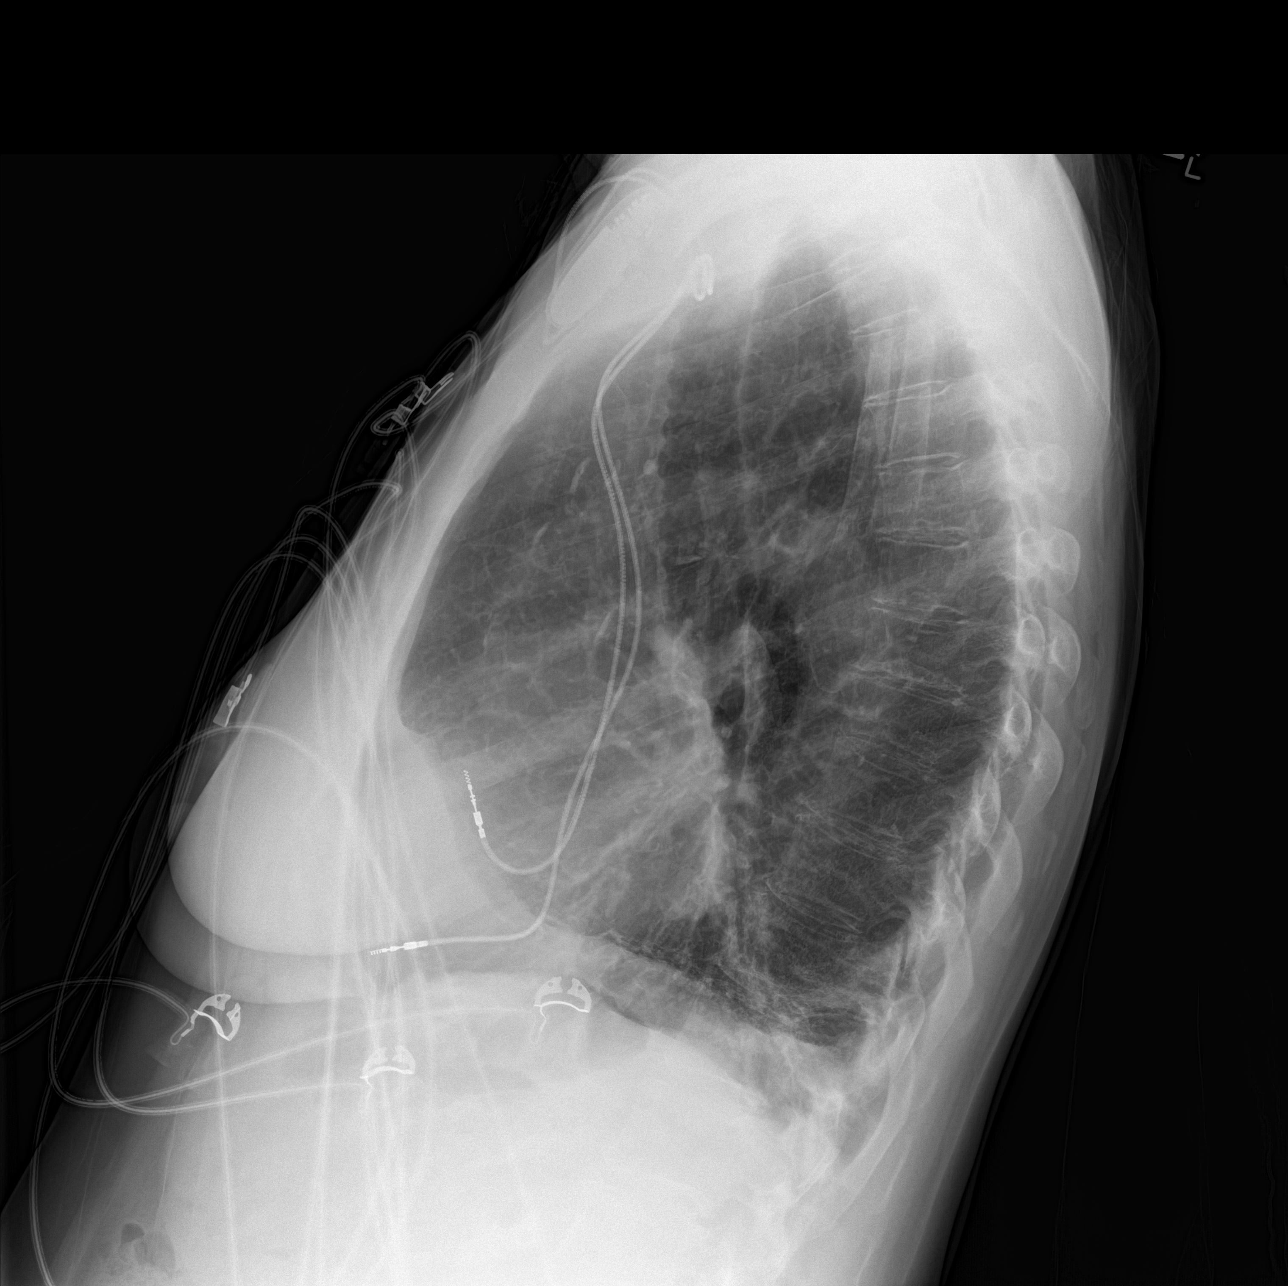

[3 of 3 positions shown; findings below may reference images not displayed]

FINDINGS: Left-sided transvenous pacemaker leads to the right atrium and right
ventricle. The heart size is normal. There is bibasilar atelectasis
or scarring. Perihilar bronchitic changes are present. There are no
focal consolidations. There is minimal left costophrenic angle
blunting versus small effusion.
IMPRESSION: 1. Bronchitic changes.
2. Bibasilar atelectasis or scarring.
3.  No evidence for acute pulmonary abnormality.

## 2016-08-02 ENCOUNTER — Encounter: Payer: PPO | Admitting: *Deleted

## 2016-08-02 ENCOUNTER — Telehealth: Payer: Self-pay | Admitting: Cardiology

## 2016-08-02 NOTE — Telephone Encounter (Signed)
Spoke with pt and reminded pt of remote transmission that is due today. Pt verbalized understanding.   

## 2016-08-05 ENCOUNTER — Encounter: Payer: Self-pay | Admitting: Cardiology

## 2016-08-16 ENCOUNTER — Encounter: Payer: PPO | Admitting: Cardiology

## 2016-08-24 MED FILL — IPRATROPIUM BR 0.02% SOLN: 0.02 | 90 days supply | Qty: 900 | Fill #1

## 2016-08-24 MED FILL — ALBUTEROL 0.083% INHAL SOLN: (2.5 MG/3ML | 30 days supply | Qty: 375 | Fill #0

## 2016-08-29 ENCOUNTER — Encounter: Payer: Self-pay | Admitting: Cardiology

## 2016-08-29 ENCOUNTER — Ambulatory Visit (INDEPENDENT_AMBULATORY_CARE_PROVIDER_SITE_OTHER): Payer: PPO | Admitting: Cardiology

## 2016-08-29 VITALS — BP 148/80 | HR 114 | Ht 66.0 in | Wt 142.6 lb

## 2016-08-29 DIAGNOSIS — Z950 Presence of cardiac pacemaker: Secondary | ICD-10-CM | POA: Diagnosis not present

## 2016-08-29 DIAGNOSIS — I442 Atrioventricular block, complete: Secondary | ICD-10-CM | POA: Diagnosis not present

## 2016-08-29 DIAGNOSIS — Z45018 Encounter for adjustment and management of other part of cardiac pacemaker: Secondary | ICD-10-CM | POA: Diagnosis not present

## 2016-08-29 NOTE — Progress Notes (Signed)
Electrophysiology Office Note   Date:  08/29/2016   ID:  Jenna Marquez, DOB 1946/05/07, MRN [id number]  PCP:  Lamar Blinks, MD  Cardiologist:  Lovena Le Primary Electrophysiologist:  Emori Kamau Meredith Leeds, MD    Chief Complaint  Patient presents with  . Pacemaker Check    Complete heart block     History of Present Illness: Jenna Marquez is a 71 y.o. female who presents today for electrophysiology evaluation.   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 She was found to have atrial fibrillation on her heart monitor. Had previous discussions with her about wanting anticoagulation, but she has declined in the past. Today she says that she is a little short of breath and is wheezing. She says that this is due to her asthma and COPD. She is recently taken breathing treatments.  Today, she denies symptoms of palpitations, chest pain, shortness of breath, orthopnea, PND, lower extremity edema, claudication, dizziness, presyncope, syncope, bleeding, or neurologic sequela. The patient is tolerating medications without difficulties and is otherwise without complaint today.    Past Medical History:  Diagnosis Date  . Allergic rhinitis   . Anxiety    "occasionally"  . Asthma   . COPD (chronic obstructive pulmonary disease) ([place])   . GERD (gastroesophageal reflux disease)   . Mobitz type 2 second degree AV block 01/06/2015   Past Surgical History:  Procedure Laterality Date  . [place]  . EP IMPLANTABLE DEVICE N/A 01/07/2015   Procedure: Pacemaker Implant;  Surgeon: Evans Lance, MD;  Location: [facility];  Service: Cardiovascular;  Laterality: N/A;  . PACEMAKER INSERTION    . TONSILLECTOMY AND ADENOIDECTOMY  1954     Current Outpatient Prescriptions  Medication Sig Dispense Refill  . albuterol (PROVENTIL HFA;VENTOLIN HFA) 108 (90 Base) MCG/ACT inhaler Inhale 2 puffs into the lungs every 6  (six) hours as needed for wheezing or shortness of breath. 1 Inhaler 2  . albuterol (PROVENTIL) (2.5 MG/3ML) 0.083% nebulizer solution 4 TIMES DAILY DX: J44.9 1080 mL 0  . ALPRAZolam (XANAX) 0.5 MG tablet Take 1 tablet (0.5 mg total) by mouth at bedtime as needed for sleep (help for TMJ). Do not take with alcohol 15 tablet 0  . B Complex Vitamins (B COMPLEX-B12 PO) Take by mouth.    . budesonide-formoterol (SYMBICORT) 160-4.5 MCG/ACT inhaler Inhale 2 puffs into the lungs 2 (two) times daily. 3 Inhaler 1  . calcium-vitamin D (OSCAL WITH D) 250-125 MG-UNIT tablet Take 1 tablet by mouth daily.    . Cholecalciferol (VITAMIN D) 2000 UNITS CAPS Take 1 capsule by mouth daily.    Marland Kitchen guaiFENesin (MUCINEX) 600 MG 12 hr tablet Take 600 mg by mouth daily as needed for cough or to loosen phlegm.    Marland Kitchen guaifenesin (ROBITUSSIN) 100 MG/5ML syrup Take 200 mg by mouth 3 (three) times daily as needed for cough.    Marland Kitchen ibuprofen (ADVIL,MOTRIN) 800 MG tablet TAKE 1 TABLET BY MOUTH EVERY 8 HOURS AS NEEDED 60 tablet 0  . ipratropium (ATROVENT) 0.02 % nebulizer solution Inhale 2.5 mLs (0.5 mg total) into the lungs 4 (four) times daily. DX J44.9 360 mL 1  . Multiple Vitamin (MULTIVITAMIN) tablet Take 2 tablets by mouth every morning.      No current facility-administered medications for this visit.     Allergies:   Avelox [moxifloxacin hcl in nacl]   Social History:  The patient  reports  that she quit smoking about 4 years ago. Her smoking use included Cigarettes. She has a 35.00 pack-year smoking history. She has never used smokeless tobacco. She reports that she drinks about 16.8 oz of alcohol per week . She reports that she does not use drugs.   Family History:  The patient's family history includes Coronary artery disease in her mother; Heart disease in her mother.    ROS:  Please see the history of present illness.   All other systems are reviewed and positive for SOB, cough, DOE, wheezing.    PHYSICAL EXAM: VS:   BP (!) 148/80   Pulse (!) 114   Ht 5\' 6"  (1.676 m)   Wt 142 lb 9.6 oz (64.7 kg)   BMI 23.02 kg/m  , BMI Body mass index is 23.02 kg/m. GEN: Well nourished, well developed, in no acute distress HEENT: normal Neck: no JVD, carotid bruits, or masses Cardiac: RRR; no murmurs, rubs, or gallops,no edema  Respiratory:  clear to auscultation bilaterally, normal work of breathing GI: soft, nontender, nondistended, + BS MS: no deformity or atrophy Skin: warm and dry Neuro:  Strength and sensation are intact Psych: euthymic mood, full affect  EKG:  EKG is ordered today. The ekg ordered today shows sinus rhythm, V paced, rate 114   Device interrogation is reviewed today in detail.  See PaceArt for details. Two episodes with A rates >400  Recent Labs: 10/07/2015: BUN 11; Creatinine, Ser 0.81; Hemoglobin 12.1; Platelets 469; Potassium 3.8; Sodium 137 11/25/2015: ALT 19    Lipid Panel     Component Value Date/Time   CHOL 205 (H) 11/25/2015 0749   TRIG 62.0 11/25/2015 0749   HDL 110.40 11/25/2015 0749   CHOLHDL 2 11/25/2015 0749   VLDL 12.4 11/25/2015 0749   LDLCALC 82 11/25/2015 0749   LDLDIRECT 114.2 01/30/2013 1133     Wt Readings from Last 3 Encounters:  08/29/16 142 lb 9.6 oz (64.7 kg)  07/03/16 141 lb (64 kg)  06/20/16 141 lb (64 kg)      Other studies Reviewed: Additional studies/ records that were reviewed today include:  TTE 01/07/15  Review of the above records today demonstrates:  - Left ventricle: The cavity size was normal. Wall thickness was normal. The estimated ejection fraction was 65%. Wall motion was normal; there were no regional wall motion abnormalities. - Aortic valve: Sclerosis without stenosis. There was no significant regurgitation. - Right ventricle: The cavity size was normal. Lateral annulus peak S velocity: 19.4 cm/s.   ASSESSMENT AND PLAN:  1.  2:1 AV block: Dual-chamber pacemaker in place, functioning appropriately. No changes  necessary at this time. She is tachycardic today, but she does say that this is due to the breathing treatments that she has done.  2. Paroxysmal atrial fibrillation: Incidentally found on her monitor. She was asymptomatic. She has a CHADS2VASc of 2 for age and female gender. We discussed the options of anticoagulation including Coumadin versus normal anticoagulants. She continues to refuse anticoagulation. I told her that if she changes her mind to call the clinic and we'll have further discussions.  Current medicines are reviewed at length with the patient today.   The patient does not have concerns regarding her medicines.  The following changes were made today:  none  Labs/ tests ordered today include:  Orders Placed This Encounter  Procedures  . EKG 12-Lead     Disposition:   FU with Jenna Marquez 1 year  Signed, Holley Kocurek Meredith Leeds, MD  08/29/2016 11:42 AM     [facility] [address] [place] [address] [phone] (office) [phone] (fax)

## 2016-08-29 NOTE — Patient Instructions (Addendum)
Medication Instructions:    Your physician recommends that you continue on your current medications as directed. Please refer to the Current Medication list given to you today.  --- If you need a refill on your cardiac medications before your next appointment, please call your pharmacy. ---  Labwork:  None ordered  Testing/Procedures:  None ordered  Follow-Up: Remote monitoring is used to monitor your Pacemaker of ICD from home. This monitoring reduces the number of office visits required to check your device to one time per year. It allows [place] to keep an eye on the functioning of your device to ensure it is working properly. You are scheduled for a device check from home on 11/28/2016. You may send your transmission at any time that day. If you have a wireless device, the transmission will be sent automatically. After your physician reviews your transmission, you will receive a postcard with your next transmission date.   Your physician wants you to follow-up in: 1 year with Dr. Curt Bears.  You will receive a reminder letter in the mail two months in advance. If you don't receive a letter, please call our office to schedule the follow-up appointment.  Thank you for choosing [facility]!!   Trinidad Curet, RN [phone]

## 2016-09-01 LAB — CUP PACEART INCLINIC DEVICE CHECK
Battery Impedance: 134 Ohm
Brady Statistic AP VP Percent: 2 %
Brady Statistic AP VS Percent: 0 %
Brady Statistic AS VP Percent: 98 %
Brady Statistic AS VS Percent: 0 %
Implantable Lead Implant Date: 20160525
Implantable Lead Implant Date: 20160525
Implantable Lead Location: 753860
Implantable Lead Model: 5076
Implantable Lead Model: 5076
Lead Channel Impedance Value: 498 Ohm
Lead Channel Impedance Value: 531 Ohm
Lead Channel Pacing Threshold Amplitude: 0.5 V
Lead Channel Pacing Threshold Amplitude: 0.75 V
Lead Channel Pacing Threshold Pulse Width: 0.4 ms
Lead Channel Sensing Intrinsic Amplitude: 2 mV
Lead Channel Setting Pacing Amplitude: 1.5 V
Lead Channel Setting Pacing Amplitude: 2.5 V
MDC IDC LEAD LOCATION: 753859
MDC IDC MSMT BATTERY REMAINING LONGEVITY: 120 mo
MDC IDC MSMT BATTERY VOLTAGE: 2.79 V
MDC IDC MSMT LEADCHNL RV PACING THRESHOLD PULSEWIDTH: 0.4 ms
MDC IDC PG IMPLANT DT: 20160525
MDC IDC SESS DTM: 20180115175014
MDC IDC SET LEADCHNL RV PACING PULSEWIDTH: 0.4 ms
MDC IDC SET LEADCHNL RV SENSING SENSITIVITY: 5.6 mV

## 2016-09-16 ENCOUNTER — Ambulatory Visit: Payer: PPO | Admitting: Medical

## 2016-09-16 ENCOUNTER — Telehealth: Payer: Self-pay | Admitting: Family Medicine

## 2016-09-16 NOTE — Telephone Encounter (Signed)
If you could look at her no show history. If has done this before then charge. But if first time no charge.

## 2016-09-16 NOTE — Telephone Encounter (Signed)
Relation to [id number] Call back number:[phone]   Reason for call:  Patient cancelled her 11:15am due to patient taking 2 prednisone at 6am and another medication and states she feels better but tired, charge or no charge

## 2016-09-30 ENCOUNTER — Emergency Department (HOSPITAL_COMMUNITY): Payer: PPO

## 2016-09-30 ENCOUNTER — Encounter (HOSPITAL_COMMUNITY): Payer: Self-pay | Admitting: Emergency Medicine

## 2016-09-30 ENCOUNTER — Inpatient Hospital Stay (HOSPITAL_COMMUNITY)
Admission: EM | Admit: 2016-09-30 | Discharge: 2016-10-02 | DRG: 190 | Disposition: A | Discharge status: Home / Self Care | Payer: PPO | Attending: Family Medicine | Admitting: Family Medicine

## 2016-09-30 DIAGNOSIS — Z79899 Other long term (current) drug therapy: Secondary | ICD-10-CM

## 2016-09-30 DIAGNOSIS — I441 Atrioventricular block, second degree: Secondary | ICD-10-CM

## 2016-09-30 DIAGNOSIS — Z791 Long term (current) use of non-steroidal anti-inflammatories (NSAID): Secondary | ICD-10-CM | POA: Diagnosis not present

## 2016-09-30 DIAGNOSIS — Z8249 Family history of ischemic heart disease and other diseases of the circulatory system: Secondary | ICD-10-CM | POA: Diagnosis not present

## 2016-09-30 DIAGNOSIS — Y92230 Patient room in hospital as the place of occurrence of the external cause: Secondary | ICD-10-CM | POA: Diagnosis not present

## 2016-09-30 DIAGNOSIS — Z950 Presence of cardiac pacemaker: Secondary | ICD-10-CM | POA: Diagnosis not present

## 2016-09-30 DIAGNOSIS — J441 Chronic obstructive pulmonary disease with (acute) exacerbation: Principal | ICD-10-CM | POA: Diagnosis present

## 2016-09-30 DIAGNOSIS — J45901 Unspecified asthma with (acute) exacerbation: Secondary | ICD-10-CM | POA: Diagnosis not present

## 2016-09-30 DIAGNOSIS — Z87891 Personal history of nicotine dependence: Secondary | ICD-10-CM | POA: Diagnosis not present

## 2016-09-30 DIAGNOSIS — R739 Hyperglycemia, unspecified: Secondary | ICD-10-CM | POA: Diagnosis not present

## 2016-09-30 DIAGNOSIS — I4891 Unspecified atrial fibrillation: Secondary | ICD-10-CM | POA: Diagnosis not present

## 2016-09-30 DIAGNOSIS — Z66 Do not resuscitate: Secondary | ICD-10-CM | POA: Diagnosis not present

## 2016-09-30 DIAGNOSIS — R0602 Shortness of breath: Secondary | ICD-10-CM

## 2016-09-30 DIAGNOSIS — N182 Chronic kidney disease, stage 2 (mild): Secondary | ICD-10-CM

## 2016-09-30 DIAGNOSIS — J9600 Acute respiratory failure, unspecified whether with hypoxia or hypercapnia: Secondary | ICD-10-CM

## 2016-09-30 DIAGNOSIS — F411 Generalized anxiety disorder: Secondary | ICD-10-CM | POA: Diagnosis not present

## 2016-09-30 DIAGNOSIS — J9601 Acute respiratory failure with hypoxia: Secondary | ICD-10-CM | POA: Diagnosis not present

## 2016-09-30 DIAGNOSIS — R069 Unspecified abnormalities of breathing: Secondary | ICD-10-CM | POA: Diagnosis not present

## 2016-09-30 DIAGNOSIS — Z7951 Long term (current) use of inhaled steroids: Secondary | ICD-10-CM | POA: Diagnosis not present

## 2016-09-30 DIAGNOSIS — Z881 Allergy status to other antibiotic agents status: Secondary | ICD-10-CM

## 2016-09-30 DIAGNOSIS — T380X5A Adverse effect of glucocorticoids and synthetic analogues, initial encounter: Secondary | ICD-10-CM | POA: Diagnosis not present

## 2016-09-30 DIAGNOSIS — I480 Paroxysmal atrial fibrillation: Secondary | ICD-10-CM | POA: Diagnosis not present

## 2016-09-30 DIAGNOSIS — J449 Chronic obstructive pulmonary disease, unspecified: Secondary | ICD-10-CM | POA: Diagnosis not present

## 2016-09-30 DIAGNOSIS — D7589 Other specified diseases of blood and blood-forming organs: Secondary | ICD-10-CM | POA: Diagnosis not present

## 2016-09-30 DIAGNOSIS — Z23 Encounter for immunization: Secondary | ICD-10-CM

## 2016-09-30 DIAGNOSIS — N183 Chronic kidney disease, stage 3 (moderate): Secondary | ICD-10-CM | POA: Diagnosis present

## 2016-09-30 DIAGNOSIS — F419 Anxiety disorder, unspecified: Secondary | ICD-10-CM | POA: Diagnosis not present

## 2016-09-30 HISTORY — DX: Acute respiratory failure with hypoxia: J96.01

## 2016-09-30 HISTORY — DX: Chronic kidney disease, stage 2 (mild): N18.2

## 2016-09-30 HISTORY — DX: Paroxysmal atrial fibrillation: I48.0

## 2016-09-30 LAB — CBC WITH DIFFERENTIAL/PLATELET
Basophils Absolute: 0 10*3/uL (ref 0.0–0.1)
Basophils Relative: 1 %
EOS ABS: 0.3 10*3/uL (ref 0.0–0.7)
Eosinophils Relative: 4 %
HEMATOCRIT: 37.3 % (ref 36.0–46.0)
HEMOGLOBIN: 12.6 g/dL (ref 12.0–15.0)
LYMPHS ABS: 2.1 10*3/uL (ref 0.7–4.0)
Lymphocytes Relative: 29 %
MCH: 36 pg — AB (ref 26.0–34.0)
MCHC: 33.8 g/dL (ref 30.0–36.0)
MCV: 106.6 fL — ABNORMAL HIGH (ref 78.0–100.0)
MONO ABS: 0.4 10*3/uL (ref 0.1–1.0)
MONOS PCT: 6 %
NEUTROS ABS: 4.4 10*3/uL (ref 1.7–7.7)
NEUTROS PCT: 60 %
Platelets: 173 10*3/uL (ref 150–400)
RBC: 3.5 MIL/uL — ABNORMAL LOW (ref 3.87–5.11)
RDW: 13.3 % (ref 11.5–15.5)
WBC: 7.2 10*3/uL (ref 4.0–10.5)

## 2016-09-30 LAB — BLOOD GAS, ARTERIAL
Acid-base deficit: 5.1 mmol/L — ABNORMAL HIGH (ref 0.0–2.0)
BICARBONATE: 19.9 mmol/L — AB (ref 20.0–28.0)
DELIVERY SYSTEMS: POSITIVE
DRAWN BY: 308601
Expiratory PAP: 5
FIO2: 60
Inspiratory PAP: 10
MODE: POSITIVE
O2 Saturation: 99.4 %
Patient temperature: 97
pCO2 arterial: 37.3 mmHg (ref 32.0–48.0)
pH, Arterial: 7.342 — ABNORMAL LOW (ref 7.350–7.450)
pO2, Arterial: 298 mmHg — ABNORMAL HIGH (ref 83.0–108.0)

## 2016-09-30 LAB — INFLUENZA PANEL BY PCR (TYPE A & B)
INFLAPCR: NEGATIVE
INFLBPCR: NEGATIVE

## 2016-09-30 LAB — BASIC METABOLIC PANEL
ANION GAP: 10 (ref 5–15)
BUN: 19 mg/dL (ref 6–20)
CALCIUM: 8.9 mg/dL (ref 8.9–10.3)
CO2: 23 mmol/L (ref 22–32)
Chloride: 108 mmol/L (ref 101–111)
Creatinine, Ser: 0.89 mg/dL (ref 0.44–1.00)
Glucose, Bld: 136 mg/dL — ABNORMAL HIGH (ref 65–99)
Potassium: 4.5 mmol/L (ref 3.5–5.1)
SODIUM: 141 mmol/L (ref 135–145)

## 2016-09-30 LAB — RESPIRATORY PANEL BY PCR
ADENOVIRUS-RVPPCR: NOT DETECTED
Bordetella pertussis: NOT DETECTED
CHLAMYDOPHILA PNEUMONIAE-RVPPCR: NOT DETECTED
CORONAVIRUS 229E-RVPPCR: NOT DETECTED
CORONAVIRUS NL63-RVPPCR: NOT DETECTED
Coronavirus HKU1: NOT DETECTED
Coronavirus OC43: NOT DETECTED
INFLUENZA A-RVPPCR: NOT DETECTED
Influenza B: NOT DETECTED
MYCOPLASMA PNEUMONIAE-RVPPCR: NOT DETECTED
Metapneumovirus: NOT DETECTED
PARAINFLUENZA VIRUS 4-RVPPCR: NOT DETECTED
Parainfluenza Virus 1: NOT DETECTED
Parainfluenza Virus 2: NOT DETECTED
Parainfluenza Virus 3: NOT DETECTED
Respiratory Syncytial Virus: NOT DETECTED
Rhinovirus / Enterovirus: NOT DETECTED

## 2016-09-30 LAB — MRSA PCR SCREENING: MRSA BY PCR: NEGATIVE

## 2016-09-30 LAB — MAGNESIUM: MAGNESIUM: 2.1 mg/dL (ref 1.7–2.4)

## 2016-09-30 LAB — TROPONIN I

## 2016-09-30 MED ORDER — ACETAMINOPHEN 650 MG RE SUPP: 650.0000 mg | Freq: Four times a day (QID) | RECTAL | Status: DC | PRN

## 2016-09-30 MED ORDER — ALBUTEROL SULFATE (2.5 MG/3ML) 0.083% IN NEBU: 2.5000 mg | INHALATION_SOLUTION | RESPIRATORY_TRACT | Status: DC | PRN

## 2016-09-30 MED ORDER — INFLUENZA VAC SPLIT QUAD 0.5 ML IM SUSY
0.5000 mL | PREFILLED_SYRINGE | INTRAMUSCULAR | Status: AC
  Administered 2016-10-02: 0.5 mL via INTRAMUSCULAR
  Filled 2016-09-30: qty 0.5

## 2016-09-30 MED ORDER — LEVALBUTEROL HCL 0.63 MG/3ML IN NEBU
0.6300 mg | INHALATION_SOLUTION | RESPIRATORY_TRACT | Status: DC | PRN
  Administered 2016-09-30 – 2016-10-01 (×2): 0.63 mg via RESPIRATORY_TRACT
  Filled 2016-09-30 (×3): qty 3

## 2016-09-30 MED ORDER — BUDESONIDE 0.5 MG/2ML IN SUSP
0.5000 mg | Freq: Two times a day (BID) | RESPIRATORY_TRACT | Status: DC
  Administered 2016-09-30 – 2016-10-02 (×5): 0.5 mg via RESPIRATORY_TRACT
  Filled 2016-09-30 (×5): qty 2

## 2016-09-30 MED ORDER — IPRATROPIUM BROMIDE 0.02 % IN SOLN
0.5000 mg | Freq: Four times a day (QID) | RESPIRATORY_TRACT | Status: DC
  Administered 2016-09-30 – 2016-10-01 (×4): 0.5 mg via RESPIRATORY_TRACT
  Filled 2016-09-30 (×4): qty 2.5

## 2016-09-30 MED ORDER — B COMPLEX-C PO TABS
1.0000 | ORAL_TABLET | Freq: Every day | ORAL | Status: DC
  Administered 2016-09-30 – 2016-10-02 (×2): 1 via ORAL
  Filled 2016-09-30 (×4): qty 1

## 2016-09-30 MED ORDER — CALCIUM CARBONATE-VITAMIN D 500-200 MG-UNIT PO TABS
0.5000 | ORAL_TABLET | Freq: Every day | ORAL | Status: DC
  Administered 2016-09-30 – 2016-10-02 (×3): 0.5 via ORAL
  Filled 2016-09-30 (×3): qty 1

## 2016-09-30 MED ORDER — VITAMIN D3 25 MCG (1000 UNIT) PO TABS
2000.0000 [IU] | ORAL_TABLET | Freq: Every day | ORAL | Status: DC
  Administered 2016-09-30 – 2016-10-02 (×3): 2000 [IU] via ORAL
  Filled 2016-09-30 (×3): qty 2

## 2016-09-30 MED ORDER — METHYLPREDNISOLONE SODIUM SUCC 125 MG IJ SOLR
60.0000 mg | Freq: Three times a day (TID) | INTRAMUSCULAR | Status: DC
  Administered 2016-09-30 – 2016-10-01 (×4): 60 mg via INTRAVENOUS
  Filled 2016-09-30 (×4): qty 2

## 2016-09-30 MED ORDER — ALBUTEROL SULFATE (2.5 MG/3ML) 0.083% IN NEBU
2.5000 mg | INHALATION_SOLUTION | Freq: Once | RESPIRATORY_TRACT | Status: AC
  Administered 2016-09-30: 2.5 mg via RESPIRATORY_TRACT

## 2016-09-30 MED ORDER — ONDANSETRON HCL 4 MG PO TABS
4.0000 mg | ORAL_TABLET | Freq: Four times a day (QID) | ORAL | Status: DC | PRN
  Administered 2016-09-30: 4 mg via ORAL
  Filled 2016-09-30: qty 1

## 2016-09-30 MED ORDER — ALBUTEROL (5 MG/ML) CONTINUOUS INHALATION SOLN
10.0000 mg/h | INHALATION_SOLUTION | Freq: Once | RESPIRATORY_TRACT | Status: AC
  Administered 2016-09-30: 10 mg/h via RESPIRATORY_TRACT
  Filled 2016-09-30: qty 20

## 2016-09-30 MED ORDER — ONDANSETRON HCL 4 MG/2ML IJ SOLN: 4.0000 mg | Freq: Four times a day (QID) | INTRAMUSCULAR | Status: DC | PRN

## 2016-09-30 MED ORDER — LEVALBUTEROL HCL 1.25 MG/0.5ML IN NEBU
1.2500 mg | INHALATION_SOLUTION | Freq: Four times a day (QID) | RESPIRATORY_TRACT | Status: DC
  Administered 2016-09-30 – 2016-10-01 (×3): 1.25 mg via RESPIRATORY_TRACT
  Filled 2016-09-30 (×6): qty 0.5

## 2016-09-30 MED ORDER — LEVALBUTEROL HCL 0.63 MG/3ML IN NEBU: 0.6300 mg | INHALATION_SOLUTION | Freq: Four times a day (QID) | RESPIRATORY_TRACT | Status: DC

## 2016-09-30 MED ORDER — IBUPROFEN 200 MG PO TABS
600.0000 mg | ORAL_TABLET | Freq: Four times a day (QID) | ORAL | Status: DC | PRN
  Administered 2016-09-30 – 2016-10-01 (×2): 600 mg via ORAL
  Filled 2016-09-30 (×2): qty 3

## 2016-09-30 MED ORDER — GUAIFENESIN 100 MG/5ML PO SOLN
200.0000 mg | Freq: Three times a day (TID) | ORAL | Status: DC | PRN
  Administered 2016-10-02: 200 mg via ORAL
  Filled 2016-09-30 (×2): qty 10

## 2016-09-30 MED ORDER — IPRATROPIUM-ALBUTEROL 0.5-2.5 (3) MG/3ML IN SOLN
3.0000 mL | Freq: Once | RESPIRATORY_TRACT | Status: AC
  Administered 2016-09-30: 3 mL via RESPIRATORY_TRACT

## 2016-09-30 MED ORDER — DEXTROSE 5 % IV SOLN
1.0000 g | Freq: Once | INTRAVENOUS | Status: AC
  Administered 2016-09-30: 1 g via INTRAVENOUS
  Filled 2016-09-30: qty 10

## 2016-09-30 MED ORDER — MOMETASONE FURO-FORMOTEROL FUM 200-5 MCG/ACT IN AERO: 2.0000 | INHALATION_SPRAY | Freq: Two times a day (BID) | RESPIRATORY_TRACT | Status: DC

## 2016-09-30 MED ORDER — ALPRAZOLAM 0.5 MG PO TABS
0.5000 mg | ORAL_TABLET | Freq: Every evening | ORAL | Status: DC | PRN
  Administered 2016-09-30 – 2016-10-01 (×2): 0.5 mg via ORAL
  Filled 2016-09-30 (×3): qty 1

## 2016-09-30 MED ORDER — SODIUM CHLORIDE 0.9% FLUSH
3.0000 mL | Freq: Two times a day (BID) | INTRAVENOUS | Status: DC
  Administered 2016-09-30 – 2016-10-01 (×2): 3 mL via INTRAVENOUS

## 2016-09-30 MED ORDER — ADULT MULTIVITAMIN W/MINERALS CH
2.0000 | ORAL_TABLET | Freq: Every morning | ORAL | Status: DC
  Administered 2016-09-30 – 2016-10-02 (×3): 2 via ORAL
  Filled 2016-09-30 (×3): qty 2

## 2016-09-30 MED ORDER — ACETAMINOPHEN 325 MG PO TABS
650.0000 mg | ORAL_TABLET | Freq: Four times a day (QID) | ORAL | Status: DC | PRN
  Administered 2016-10-02: 650 mg via ORAL
  Filled 2016-09-30: qty 2

## 2016-09-30 MED ORDER — GUAIFENESIN ER 600 MG PO TB12: 600.0000 mg | ORAL_TABLET | Freq: Every day | ORAL | Status: DC | PRN

## 2016-09-30 MED ORDER — ENOXAPARIN SODIUM 40 MG/0.4ML ~~LOC~~ SOLN
40.0000 mg | SUBCUTANEOUS | Status: DC
  Filled 2016-09-30 (×3): qty 0.4

## 2016-09-30 NOTE — ED Notes (Signed)
Bed: [id number] Expected date:  Expected time:  Means of arrival:  Comments: EMS  resp distress  CPAP

## 2016-09-30 NOTE — Progress Notes (Addendum)
After patient walked to the bathroom she stated she felt increased shortness of breath.  RN helping another patient so called and confirmed with respiratory therapy to give PRN breathing treatment.  A few minutes later nurse tech stated that pt c/o increasing shortness of breath.  RN went to pull PRN treatment from pyxis.  Respiratory therapist arrived on unit as RN was about to walk in pt room; treatment given to RT to give to patient.  After treatment given patient upset and stated that no one came to help her when she needed it.   Pt complaing about food, temperature of room, inadequate staffing, and the medications given to her.  RN listened, explained that help came as soon as possible and went over when she would get her next treatment.  Pt breathing improved.  Sitting on side of bed eating dinner.

## 2016-09-30 NOTE — ED Provider Notes (Signed)
[facility] DEPT Provider Note   CSN: [id number] Arrival date & time: 09/30/16  [id number]  By signing my name below, I, Reola Mosher, attest that this documentation has been prepared under the direction and in the presence of Danne Vasek, MD. Electronically Signed: Reola Mosher, ED Scribe. 09/30/16. 3:53 AM.  History   Chief Complaint Chief Complaint  Patient presents with  . COPD  . Respiratory Distress   LEVEL V CAVEAT: HPI and ROS limited due to severe respiratory distress   The history is provided by the patient. History limited by: respiratory distress. No language interpreter was used.  Shortness of Breath  This is a recurrent problem. The problem occurs continuously.The problem has been gradually worsening. Associated symptoms include wheezing. Pertinent negatives include no orthopnea. It is unknown what precipitated the problem. The treatment provided no relief. She has had prior ED visits. Associated medical issues include COPD.    HPI Comments: Jenna Marquez is a 71 y.o. female with a h/o COPD, asthma, anxiety, and pacemaker placement, who presents to the Emergency Department complaining of persistent, gradually worsening shortness of breath beginning tonight prior to arrival. Per EMS, pt noted to them that she has a h/o COPD and her symptoms today feel consistent with exacerbations of this. EMS administered 125mg  Solumedrol, 2mg  Mag, and two duonebs without improvement of her shortness of breath. They additionally placed her CPAP without relief as well.   Past Medical History:  Diagnosis Date  . Allergic rhinitis   . Anxiety    "occasionally"  . Asthma   . COPD (chronic obstructive pulmonary disease) ([place])   . GERD (gastroesophageal reflux disease)   . Mobitz type 2 second degree AV block 01/06/2015   Patient Active Problem List   Diagnosis Date Noted  . Swelling of arm 05/22/2015  . Left shoulder pain 05/22/2015  . Chest pain on breathing 05/11/2015    . CKD (chronic kidney disease) stage 3, GFR 30-59 ml/min 05/11/2015  . Pacemaker 04/22/2015  . COPD exacerbation ([place]) 08/16/2013  . Generalized anxiety disorder 01/11/2012  . 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   . Tobacco abuse    Past Surgical History:  Procedure Laterality Date  . [place]  . EP IMPLANTABLE DEVICE N/A 01/07/2015   Procedure: Pacemaker Implant;  Surgeon: Evans Lance, MD;  Location: [facility];  Service: Cardiovascular;  Laterality: N/A;  . PACEMAKER INSERTION    . TONSILLECTOMY AND ADENOIDECTOMY  1954   OB History    No data available     Home Medications    Prior to Admission medications   Medication Sig Start Date End Date Taking? Authorizing Provider  albuterol (PROVENTIL HFA;VENTOLIN HFA) 108 (90 Base) MCG/ACT inhaler Inhale 2 puffs into the lungs every 6 (six) hours as needed for wheezing or shortness of breath. 02/25/16   Tammy S Parrett, NP  albuterol (PROVENTIL) (2.5 MG/3ML) 0.083% nebulizer solution 4 TIMES DAILY DX: J44.9 02/26/16   Tammy S Parrett, NP  ALPRAZolam (XANAX) 0.5 MG tablet Take 1 tablet (0.5 mg total) by mouth at bedtime as needed for sleep (help for TMJ). Do not take with alcohol 04/29/16   Shelda Pal, DO  B Complex Vitamins (B COMPLEX-B12 PO) Take by mouth.    Historical Provider, MD  budesonide-formoterol (SYMBICORT) 160-4.5 MCG/ACT inhaler Inhale 2 puffs into the lungs 2 (two) times daily. 06/10/16   Tammy S Parrett, NP  calcium-vitamin D (OSCAL WITH D) 250-125 MG-UNIT tablet  Take 1 tablet by mouth daily.    Historical Provider, MD  Cholecalciferol (VITAMIN D) 2000 UNITS CAPS Take 1 capsule by mouth daily.    Historical Provider, MD  guaiFENesin (MUCINEX) 600 MG 12 hr tablet Take 600 mg by mouth daily as needed for cough or to loosen phlegm.    Historical Provider, MD  guaifenesin (ROBITUSSIN) 100 MG/5ML syrup Take 200 mg by mouth 3 (three) times daily as needed for cough.     Historical Provider, MD  ibuprofen (ADVIL,MOTRIN) 800 MG tablet TAKE 1 TABLET BY MOUTH EVERY 8 HOURS AS NEEDED 05/06/16   Gay Filler Copland, MD  ipratropium (ATROVENT) 0.02 % nebulizer solution Inhale 2.5 mLs (0.5 mg total) into the lungs 4 (four) times daily. DX J44.9 02/25/16   Tammy S Parrett, NP  Multiple Vitamin (MULTIVITAMIN) tablet Take 2 tablets by mouth every morning.     Historical Provider, MD    Family History Family History  Problem Relation Age of Onset  . Coronary artery disease Mother   . Heart disease Mother     CHF  . Diabetes Neg Hx   . Cancer Neg Hx   . Stroke Neg Hx   . Colon cancer Neg Hx     Social History Social History  Substance Use Topics  . Smoking status: Former Smoker    Packs/day: 1.00    Years: 35.00    Types: Cigarettes    Quit date: 08/10/2012  . Smokeless tobacco: Never Used     Comment: Started at age 70  . Alcohol use 16.8 oz/week    28 Shots of liquor per week     Comment: 01/06/2015 "2 shot mixed drinks, 2/day"   Allergies   Avelox [moxifloxacin hcl in nacl]  Review of System Review of Systems  Unable to perform ROS: Severe respiratory distress  Respiratory: Positive for shortness of breath and wheezing.   Cardiovascular: Negative for orthopnea.   Physical Exam Updated Vital Signs There were no vitals taken for this visit.  Physical Exam  Constitutional: She appears well-developed and well-nourished. She appears distressed.  HENT:  Head: Normocephalic and atraumatic.  Mouth/Throat: Oropharynx is clear and moist. No oropharyngeal exudate.  Eyes: Conjunctivae and EOM are normal. Pupils are equal, round, and reactive to light. Right eye exhibits no discharge. Left eye exhibits no discharge. No scleral icterus.  Neck: Normal range of motion. Neck supple. No JVD present. No tracheal deviation present.  Trachea is midline. No stridor or carotid bruits.   Cardiovascular: Normal rate, regular rhythm, normal heart sounds and intact  distal pulses.   No murmur heard. Pulmonary/Chest: No stridor. Tachypnea noted. She is in respiratory distress. She has decreased breath sounds in the right upper field, the right middle field, the right lower field, the left upper field, the left middle field and the left lower field. She has wheezes in the right upper field, the right middle field, the right lower field, the left upper field, the left middle field and the left lower field. She has rhonchi in the right upper field, the right middle field, the right lower field, the left upper field, the left middle field and the left lower field. She has no rales. She exhibits no tenderness.  Abdominal: Soft. Bowel sounds are normal. She exhibits no distension. There is no tenderness. There is no rebound and no guarding.  Musculoskeletal: Normal range of motion. She exhibits no edema or tenderness.  All compartments are soft. No palpable cords.   Lymphadenopathy:  She has no cervical adenopathy.  Neurological: She is alert. She has normal reflexes. She displays normal reflexes. She exhibits normal muscle tone.  Skin: Skin is warm and dry. Capillary refill takes less than 2 seconds.  Psychiatric: She has a normal mood and affect. Her behavior is normal.  Nursing note and vitals reviewed.  ED Treatments / Results  DIAGNOSTIC STUDIES: Oxygen Saturation is 100% on CPAP, normal by my interpretation.   Results for orders placed or performed in visit on 08/29/16  CUP [place] CHECK  Result Value Ref Range   Date Time Interrogation Session [id number]    Pulse Generator Manufacturer MERM    Pulse Gen Model [id number] Adapta    Pulse Gen Serial Number [id number]    Clinic Name [facility] Pulse Generator Type Implantable Pulse Generator    Implantable Pulse Generator Implant Date [id number]    Implantable Lead Manufacturer MERM    Implantable Lead Model 5076 CapSureFix Novus    Implantable Lead Serial Number  [id number]    Implantable Lead Implant Date NB:[id number]    Implantable Lead Location Detail 1 APPENDAGE    Implantable Lead Location [id number]    Implantable Lead Manufacturer MERM    Implantable Lead Model 5076 CapSureFix Novus    Implantable Lead Serial Number [id number]    Implantable Lead Implant Date NB:[id number]    Implantable Lead Location Detail 1 APEX    Implantable Lead Location [id number]    Lead Channel Setting Sensing Sensitivity 5.60 mV   Lead Channel Setting Pacing Amplitude 1.500 V   Lead Channel Setting Pacing Pulse Width 0.40 ms   Lead Channel Setting Pacing Amplitude 2.500 V   Lead Channel Impedance Value 498 ohm   Lead Channel Sensing Intrinsic Amplitude 2.00 mV   Lead Channel Pacing Threshold Amplitude 0.500 V   Lead Channel Pacing Threshold Pulse Width 0.40 ms   Lead Channel Impedance Value 531 ohm   Lead Channel Pacing Threshold Amplitude 0.750 V   Lead Channel Pacing Threshold Pulse Width 0.40 ms   Battery Status OK    Battery Remaining Longevity 120 mo   Battery Voltage 2.79 V   Battery Impedance 134 ohm   Brady Statistic AP VP Percent 2 %   Brady Statistic AS VP Percent 98 %   Brady Statistic AP VS Percent 0 %   Brady Statistic AS VS Percent 0 %   Eval Rhythm CHB/Vp @ 30    No results found.    EKG Interpretation  Date/Time:  Friday September 30 2016 04:06:55 EST Ventricular Rate:  97 PR Interval:    QRS Duration: 157 QT Interval:  418 QTC Calculation: 531 R Axis:   -65 Text Interpretation:  Ventricular-paced rhythm No further analysis attempted due to paced rhythm Confirmed by [facility]  MD, Emmaline Kluver ([id number]) on 09/30/2016 5:12:53 AM       Procedures Procedures   Medications Ordered in ED  Medications  cefTRIAXone (ROCEPHIN) 1 g in dextrose 5 % 50 mL IVPB (not administered)  ipratropium-albuterol (DUONEB) 0.5-2.5 (3) MG/3ML nebulizer solution 3 mL (3 mLs Nebulization Given 09/30/16 0432)  albuterol (PROVENTIL) (2.5 MG/3ML) 0.083% nebulizer solution 2.5  mg (2.5 mg Nebulization Given 09/30/16 0432)  albuterol (PROVENTIL,VENTOLIN) solution continuous neb (10 mg/hr Nebulization Given 09/30/16 0611)    BIPAP started immediately on arrival steroids and magnesium already given by EMS  Case d/w Dr. Chase Caller obtain flu specimen and to be seen in consult by PCCM please admit to medicine step down  Final Clinical Impressions(s) / ED Diagnoses  COPD exacerbation:  Will admit to stepdown   New Prescriptions New Prescriptions   No medications on file  I personally performed the services described in this documentation, which was scribed in my presence. The recorded information has been reviewed and is accurate.      Veatrice Kells, MD 10/01/16 [phone]

## 2016-09-30 NOTE — Consult Note (Signed)
Name: Jenna Marquez MRN: [id number] DOB: 04-11-1946    ADMISSION DATE:  09/30/2016 CONSULTATION DATE:  09/30/16  REFERRING MD :  Dr. Carles Collet   CHIEF COMPLAINT:  Shortness of breath    HISTORY OF PRESENT ILLNESS:  71 y/o F who presented to [facility] on 2/16 with complaints of acute onset shortness of breath.    The patient reports she is followed by Dr. Elsworth Soho for asthma / COPD (baseline on symbicort + spiriva and PRN nebs).  She states she normally coughs up thick clear sputum and this has not changed in color or quantity.  She was in her usual state of health 2/16 until early am when she became short of breath.  She used her nebulizer without significant relief.  Also, checked her room air saturations which were 99%.  EMS was activated and the patient was treated with 2gm magnesium, 125 mg solumedrol without relief.  She was placed on bipap and transported to the ER.  Initial ER evaluation included an ABG of 7.342 / 37 / 298/ 19.9, labs Na 141, K 4.5, Cl 108, glucose 136, sr cr 0.89, AST 47, WBC 7.2, Hgb 12.6, MCV 106.6 and platelets 173.  CXR demonstrated changes consistent with COPD and left lung basilar atelectasis/scarring.  The patient was admitted by [facility].    Upon arrival to ICU, she was taken off bipap and in no distress.  She reports she is ready to go home so she can get a drink of "brandy" and rest.  She denies fevers, chills and can not identify a specific trigger.  However, she reports she did go out to the stores yesterday.  No known sick contacts.  She has not had a flu shot.   PCCM consulted for evaluation of SOB / possible asthma exacerbation.  PAST MEDICAL HISTORY :   has a past medical history of Allergic rhinitis; Anxiety; Asthma; COPD (chronic obstructive pulmonary disease) ([place]); GERD (gastroesophageal reflux disease); and Mobitz type 2 second degree AV block (01/06/2015).   has a past surgical history that includes Ectopic pregnancy surgery (1974); Tonsillectomy and adenoidectomy  (1954); Cardiac catheterization (N/A, 01/07/2015); and Pacemaker insertion.  Prior to Admission medications   Medication Sig Start Date End Date Taking? Authorizing Provider  albuterol (PROVENTIL HFA;VENTOLIN HFA) 108 (90 Base) MCG/ACT inhaler Inhale 2 puffs into the lungs every 6 (six) hours as needed for wheezing or shortness of breath. 02/25/16  Yes Tammy S Parrett, NP  albuterol (PROVENTIL) (2.5 MG/3ML) 0.083% nebulizer solution 4 TIMES DAILY DX: J44.9 02/26/16  Yes Tammy S Parrett, NP  ALPRAZolam (XANAX) 0.5 MG tablet Take 1 tablet (0.5 mg total) by mouth at bedtime as needed for sleep (help for TMJ). Do not take with alcohol 04/29/16  Yes Crosby Oyster Wendling, DO  B Complex Vitamins (B COMPLEX-B12 PO) Take by mouth.   Yes Historical Provider, MD  budesonide-formoterol (SYMBICORT) 160-4.5 MCG/ACT inhaler Inhale 2 puffs into the lungs 2 (two) times daily. 06/10/16  Yes Tammy S Parrett, NP  calcium-vitamin D (OSCAL WITH D) 250-125 MG-UNIT tablet Take 1 tablet by mouth daily.   Yes Historical Provider, MD  Cholecalciferol (VITAMIN D) 2000 UNITS CAPS Take 1 capsule by mouth daily.   Yes Historical Provider, MD  guaiFENesin (MUCINEX) 600 MG 12 hr tablet Take 600 mg by mouth daily as needed for cough or to loosen phlegm.   Yes Historical Provider, MD  guaifenesin (ROBITUSSIN) 100 MG/5ML syrup Take 200 mg by mouth 3 (three) times daily as needed for cough.  Yes Historical Provider, MD  ibuprofen (ADVIL,MOTRIN) 800 MG tablet TAKE 1 TABLET BY MOUTH EVERY 8 HOURS AS NEEDED 05/06/16  Yes Jessica C Copland, MD  ipratropium (ATROVENT) 0.02 % nebulizer solution Inhale 2.5 mLs (0.5 mg total) into the lungs 4 (four) times daily. DX J44.9 02/25/16  Yes Tammy S Parrett, NP  Multiple Vitamin (MULTIVITAMIN) tablet Take 2 tablets by mouth every morning.    Yes Historical Provider, MD    Allergies  Allergen Reactions  . Avelox [Moxifloxacin Hcl In Nacl] Itching and Rash    FAMILY HISTORY:  family history  includes Coronary artery disease in her mother; Heart disease in her mother.  SOCIAL HISTORY:  reports that she quit smoking about 4 years ago. Her smoking use included Cigarettes. She has a 35.00 pack-year smoking history. She has never used smokeless tobacco. She reports that she drinks about 16.8 oz of alcohol per week . She reports that she does not use drugs.  REVIEW OF SYSTEMS:  POSITIVES IN BOLD Constitutional: Negative for fever, chills, weight loss, malaise/fatigue and diaphoresis.  HENT: Negative for hearing loss, ear pain, nosebleeds, congestion, sore throat, neck pain, tinnitus and ear discharge.   Eyes: Negative for blurred vision, double vision, photophobia, pain, discharge and redness.  Respiratory: Negative for cough, hemoptysis, sputum production, shortness of breath, wheezing and stridor.   Cardiovascular: Negative for chest pain, palpitations, orthopnea, claudication, leg swelling and PND.  Gastrointestinal: Negative for heartburn, nausea, vomiting, abdominal pain, diarrhea, constipation, blood in stool and melena.  Genitourinary: Negative for dysuria, urgency, frequency, hematuria and flank pain.  Musculoskeletal: Negative for myalgias, back pain, joint pain and falls.  Skin: Negative for itching and rash.  Neurological: Negative for dizziness, tingling, tremors, sensory change, speech change, focal weakness, seizures, loss of consciousness, weakness and headaches.  Endo/Heme/Allergies: Negative for environmental allergies and polydipsia. Does not bruise/bleed easily.  SUBJECTIVE:   VITAL SIGNS: Temp:  [97.5 F (36.4 C)] 97.5 F (36.4 C) (02/16 0815) Pulse Rate:  [96-108] 108 (02/16 0815) Resp:  [22-30] 29 (02/16 0815) BP: (139-175)/(44-59) 170/57 (02/16 0815) SpO2:  [95 %-100 %] 99 % (02/16 0815) FiO2 (%):  [40 %-60 %] 40 % (02/16 0432) Weight:  [139 lb 15.9 oz (63.5 kg)] 139 lb 15.9 oz (63.5 kg) (02/16 0815)  PHYSICAL EXAMINATION: General:  Well developed adult  female in NAD Neuro:  AAOx4, speech clear, MAE HEENT:  MM pink/moist, no jvd  Cardiovascular:  s1s2 tachy, A-paced Lungs:  Even/non-labored, lungs bilaterally with occasional soft wheeze, speaks full paragraphs Abdomen:  Obese/soft, bsx4 active  Musculoskeletal:  No acute deformities  Skin:  Warm/dry, no edema, rashes or lesions   Recent Labs Lab 09/30/16 0620  NA 141  K 4.5  CL 108  CO2 23  BUN 19  CREATININE 0.89  GLUCOSE 136*     Recent Labs Lab 09/30/16 0351  HGB 12.6  HCT 37.3  WBC 7.2  PLT 173    Dg Chest Port 1 View  Result Date: 09/30/2016 CLINICAL DATA:  Shortness of breath.  History of COPD. EXAM: PORTABLE CHEST 1 VIEW COMPARISON:  Chest radiograph October 07, 2015 FINDINGS: Cardiomediastinal silhouette is normal. Calcified aortic knob. Dual lead LEFT cardiac pacemaker in situ. Chronic interstitial changes with strandy densities LEFT lung base. No pleural effusions. No pneumothorax. Soft tissue planes and included osseous structures are nonsuspicious. Osteopenia. IMPRESSION: COPD.  LEFT lung base atelectasis/scarring. Electronically Signed   By: [name] M.D.   On: 09/30/2016 04:15  SIGNIFICANT EVENTS  2/16  Admit with acute onset shortness of breath  STUDIES:   MICRO: RVP 2/16 >>   ASSESSMENT / PLAN:  1. Acute exacerbation of Asthma / COPD - no specific trigger per patient, viral panel pending.  Improved since arrival, now off bipap.    2. GOLD C COPD / Asthma - Spirometry 02/2016 shows FEV1 46%, , ratio 35 , FVC 102%, mid flows 20%  3.  Former Tobacco Abuse - uses E cigs on/off   4.  Macrocytosis - query underlying ETOH use  Plan: Continue solumedrol, consider rapid reduction 2/17 with wean to off  Continue budesonide Hold dulera while inpatient > transition back to symbicort at discharge with PRN duoneb and/or MDI albuterol for rescue Xopenex + Atrovent Q6 with PRN xopenex Intermittent CXR  Await respiratory viral studies Wean  O2 for sats > 90% Assess ambulatory O2 sats prior to discharge  D/C BiPAP  Administer flu shot  Diet as tolerated  Follow up with Pulmonary at discharge, appt arranged  PCCM will be available as needed.  Please call back if new needs arise.   Noe Gens, NP-C [facility] Pgr: [phone] or if no answer [phone] 09/30/2016, 8:34 AM

## 2016-09-30 NOTE — ED Triage Notes (Signed)
Per EMS pt coming from home on CPAP with complaints of shortness of breath associated with COPD exacerbation that began around 2am. Pt received 4 neb txs prior to arrival, 2g mag sulfate, and 125 of solumedrol with little to no relief of symptoms. Inspiratory and expiratory wheezes present. Pt has pacemaker.

## 2016-09-30 NOTE — H&P (Addendum)
History and Physical  Jenna Marquez [id number] DOB: [birth year] DOA: 09/30/2016   PCP: Lamar Blinks, MD   Patient coming from: Home  Chief Complaint: dyspnea  HPI:  Jenna Marquez is a 71 y.o. 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 The patient states that she is "always short of breath", but had an acute worsening when she woke up in the early morning. She states that her usual cough has not changed. She denies any worsening sputum production or hemoptysis. She denies any fevers, chills, headaches, sore throat, nausea, vomiting, diarrhea, abdominal pain, chest pain, dysuria. The patient complained of some sweats. She denies any sick contacts. On an average day, she states that she uses her nebulizer with albuterol and Atrovent 2-3 times per day, but this did not relieve her shortness of breath earlier on the morning of admission. EMS was activated, and the patient was given Solu-Medrol, magnesium, and duo nebs en route to the hospital. She continued to be in respiratory distress and placed on BiPAP upon arrival in the ED.  In the emergency department, the patient received an hour-long nebulizer and ceftriaxone. There was no registered temperature on the medical record, but the patient was hemodynamically stable. At the time of my evaluation, the patient had been off of BiPAP for nearly one hour without any distress. She was able to speak in complete sentences. She was admitted medical of a nebulizer treatment with oxygen saturation 99%.  Assessment/Plan: Acute respiratory failure with hypoxia -The patient is not on supplemental oxygen at home -Secondary to COPD exacerbation -Wean oxygen her saturation greater than 92%  COPD exacerbation -Influenza PCR negative -Check viral respiratory panel as this result may impact how quickly the patient will  improve -Continue IV Solu-Medrol -Continue atrovent, switch to xopenex due to tachycardia -Add Pulmicort -PCCM has been consulted -quit smoking 6 years ago  Paroxysmal atrial fibrillation -CHADS-VASc = 2 -pt has refused anticoagulation in the past, and presently refuses anticoagulation again -Aspirin 81 mg daily if the patient is agreeable and okay with PCCM -HR in low 100s--partly due to duo nebs and acute exacerbation  Mobitz 2 AV block -Status post PPM -Follows Dr. Curt Bears  CKD stage 2 -baseline creatinine 0.8-1.1  Hyperglycemia -likely due to steroids -check A1C  Anxiety -Continue home dose alprazolam       Past Medical History:  Diagnosis Date  . Allergic rhinitis   . Anxiety    "occasionally"  . Asthma   . COPD (chronic obstructive pulmonary disease) ([place])   . GERD (gastroesophageal reflux disease)   . Mobitz type 2 second degree AV block 01/06/2015   Past Surgical History:  Procedure Laterality Date  . [place]  . EP IMPLANTABLE DEVICE N/A 01/07/2015   Procedure: Pacemaker Implant;  Surgeon: Evans Lance, MD;  Location: [facility];  Service: Cardiovascular;  Laterality: N/A;  . PACEMAKER INSERTION    . TONSILLECTOMY AND ADENOIDECTOMY  1954   Social History:  reports that she quit smoking about 4 years ago. Her smoking use included Cigarettes. She has a 35.00 pack-year smoking history. She has never used smokeless tobacco. She reports that she drinks about 16.8 oz of alcohol per week . She reports that she does not use drugs.   Family History  Problem Relation Age of Onset  . Coronary artery disease Mother   .  Heart disease Mother     CHF  . Diabetes Neg Hx   . Cancer Neg Hx   . Stroke Neg Hx   . Colon cancer Neg Hx      Allergies  Allergen Reactions  . Avelox [Moxifloxacin Hcl In Nacl] Itching and Rash     Prior to Admission medications   Medication Sig Start Date End Date Taking? Authorizing Provider   albuterol (PROVENTIL HFA;VENTOLIN HFA) 108 (90 Base) MCG/ACT inhaler Inhale 2 puffs into the lungs every 6 (six) hours as needed for wheezing or shortness of breath. 02/25/16  Yes Tammy S Parrett, NP  albuterol (PROVENTIL) (2.5 MG/3ML) 0.083% nebulizer solution 4 TIMES DAILY DX: J44.9 02/26/16  Yes Tammy S Parrett, NP  ALPRAZolam (XANAX) 0.5 MG tablet Take 1 tablet (0.5 mg total) by mouth at bedtime as needed for sleep (help for TMJ). Do not take with alcohol 04/29/16  Yes Crosby Oyster Wendling, DO  B Complex Vitamins (B COMPLEX-B12 PO) Take by mouth.   Yes Historical Provider, MD  budesonide-formoterol (SYMBICORT) 160-4.5 MCG/ACT inhaler Inhale 2 puffs into the lungs 2 (two) times daily. 06/10/16  Yes Tammy S Parrett, NP  calcium-vitamin D (OSCAL WITH D) 250-125 MG-UNIT tablet Take 1 tablet by mouth daily.   Yes Historical Provider, MD  Cholecalciferol (VITAMIN D) 2000 UNITS CAPS Take 1 capsule by mouth daily.   Yes Historical Provider, MD  guaiFENesin (MUCINEX) 600 MG 12 hr tablet Take 600 mg by mouth daily as needed for cough or to loosen phlegm.   Yes Historical Provider, MD  guaifenesin (ROBITUSSIN) 100 MG/5ML syrup Take 200 mg by mouth 3 (three) times daily as needed for cough.   Yes Historical Provider, MD  ibuprofen (ADVIL,MOTRIN) 800 MG tablet TAKE 1 TABLET BY MOUTH EVERY 8 HOURS AS NEEDED 05/06/16  Yes Jessica C Copland, MD  ipratropium (ATROVENT) 0.02 % nebulizer solution Inhale 2.5 mLs (0.5 mg total) into the lungs 4 (four) times daily. DX J44.9 02/25/16  Yes Tammy S Parrett, NP  Multiple Vitamin (MULTIVITAMIN) tablet Take 2 tablets by mouth every morning.    Yes Historical Provider, MD    Review of Systems:  Constitutional:  No weight loss, night sweats, Fevers, chills, fatigue.  Head&Eyes: No headache.  No vision loss.  No eye pain or scotoma ENT:  No Difficulty swallowing,Tooth/dental problems,Sore throat,  No ear ache, post nasal drip,  Cardio-vascular:  No chest pain, Orthopnea,  PND, swelling in lower extremities,  dizziness, palpitations  GI:  No  abdominal pain, vomiting, diarrhea, loss of appetite, hematochezia, melena, heartburn, indigestion, Resp:   No coughing up of blood .No wheezing.No chest wall deformity  Skin:  no rash or lesions.  GU:  no dysuria, change in color of urine, no urgency or frequency. No flank pain.  Musculoskeletal:  No joint pain or swelling. No decreased range of motion. No back pain.  Psych:  No change in mood or affect. No depression or anxiety. Neurologic: No headache, no dysesthesia, no focal weakness, no vision loss. No syncope  Physical Exam: Vitals:   09/30/16 0600 09/30/16 0630 09/30/16 0654 09/30/16 0700  BP: 143/55 (!) 156/52 (!) 156/52 (!) 139/44  Pulse: 101 108 103 102  Resp: 23 25 23 22   SpO2: 95% 97% 99% 99%   General:  A&O x 3, NAD, nontoxic, pleasant/cooperative Head/Eye: No conjunctival hemorrhage, no icterus, [place]/AT, No nystagmus ENT:  No icterus,  No thrush, good dentition, no pharyngeal exudate Neck:  No masses, no lymphadenpathy, no bruits  CV:  RRR, no rub, no gallop, no S3 Lung:  Bilateral expiratory wheeze. Bilateral rales. Abdomen: soft/NT, +BS, nondistended, no peritoneal signs Ext: No cyanosis, No rashes, No petechiae, No lymphangitis, No edema Neuro: CNII-XII intact, strength 4/5 in bilateral upper and lower extremities, no dysmetria  Labs on Admission:  Basic Metabolic Panel:  Recent Labs Lab 09/30/16 0620  NA 141  K 4.5  CL 108  CO2 23  GLUCOSE 136*  BUN 19  CREATININE 0.89  CALCIUM 8.9   Liver Function Tests: No results for input(s): AST, ALT, ALKPHOS, BILITOT, PROT, ALBUMIN in the last 168 hours. No results for input(s): LIPASE, AMYLASE in the last 168 hours. No results for input(s): AMMONIA in the last 168 hours. CBC:  Recent Labs Lab 09/30/16 0351  WBC 7.2  NEUTROABS 4.4  HGB 12.6  HCT 37.3  MCV 106.6*  PLT 173   Coagulation Profile: No results for input(s): INR,  PROTIME in the last 168 hours. Cardiac Enzymes:  Recent Labs Lab 09/30/16 0620  TROPONINI <0.03   BNP: Invalid input(s): POCBNP CBG: No results for input(s): GLUCAP in the last 168 hours. Urine analysis:    Component Value Date/Time   COLORURINE AMBER (A) 05/22/2012 2229   APPEARANCEUR CLOUDY (A) 05/22/2012 2229   LABSPEC 1.017 05/22/2012 2229   PHURINE 5.0 05/22/2012 2229   GLUCOSEU NEGATIVE 05/22/2012 2229   HGBUR NEGATIVE 05/22/2012 2229   BILIRUBINUR SMALL (A) 05/22/2012 2229   KETONESUR 15 (A) 05/22/2012 2229   PROTEINUR NEGATIVE 05/22/2012 2229   UROBILINOGEN 1.0 05/22/2012 2229   NITRITE NEGATIVE 05/22/2012 2229   LEUKOCYTESUR MODERATE (A) 05/22/2012 2229   Sepsis Labs: @LABRCNTIP (procalcitonin:4,lacticidven:4) )No results found for this or any previous visit (from the past 240 hour(s)).   Radiological Exams on Admission: Dg Chest Port 1 View  Result Date: 09/30/2016 CLINICAL DATA:  Shortness of breath.  History of COPD. EXAM: PORTABLE CHEST 1 VIEW COMPARISON:  Chest radiograph October 07, 2015 FINDINGS: Cardiomediastinal silhouette is normal. Calcified aortic knob. Dual lead LEFT cardiac pacemaker in situ. Chronic interstitial changes with strandy densities LEFT lung base. No pleural effusions. No pneumothorax. Soft tissue planes and included osseous structures are nonsuspicious. Osteopenia. IMPRESSION: COPD.  LEFT lung base atelectasis/scarring. Electronically Signed   By: [name] M.D.   On: 09/30/2016 04:15    EKG: Independently reviewed. V-paced    Time spent:55 minutes Code Status:   DNR Family Communication:  Spouse updated at bedside Disposition Plan: expect 2-3 day hospitalization Consults called: PCCM DVT Prophylaxis: [place] Lovenox  Onita Pfluger, DO  [facility] Pager [phone]  If 7PM-7AM, please contact night-coverage [email or web address] Password [facility] 09/30/2016, 7:34 AM

## 2016-09-30 NOTE — ED Notes (Addendum)
Pt demanded to be taken off of BiPAP and to be put on nasal cannula

## 2016-09-30 NOTE — Procedures (Signed)
Pt states that if she is sleeping then she does not want to be awaken at the 0200 hour for a treatment.

## 2016-10-01 DIAGNOSIS — N182 Chronic kidney disease, stage 2 (mild): Secondary | ICD-10-CM

## 2016-10-01 DIAGNOSIS — I441 Atrioventricular block, second degree: Secondary | ICD-10-CM

## 2016-10-01 DIAGNOSIS — Z950 Presence of cardiac pacemaker: Secondary | ICD-10-CM

## 2016-10-01 LAB — HEMOGLOBIN A1C
HEMOGLOBIN A1C: 4.6 % — AB (ref 4.8–5.6)
MEAN PLASMA GLUCOSE: 85 mg/dL

## 2016-10-01 MED ORDER — IBUPROFEN 800 MG PO TABS: 800.0000 mg | ORAL_TABLET | Freq: Four times a day (QID) | ORAL | Status: DC | PRN

## 2016-10-01 MED ORDER — PREDNISONE 20 MG PO TABS
60.0000 mg | ORAL_TABLET | Freq: Every day | ORAL | Status: DC
  Administered 2016-10-01 – 2016-10-02 (×2): 60 mg via ORAL
  Filled 2016-10-01 (×2): qty 3

## 2016-10-01 MED ORDER — ALBUTEROL SULFATE (2.5 MG/3ML) 0.083% IN NEBU
2.5000 mg | INHALATION_SOLUTION | RESPIRATORY_TRACT | Status: DC
  Administered 2016-10-02 (×2): 2.5 mg via RESPIRATORY_TRACT
  Filled 2016-10-01 (×2): qty 3

## 2016-10-01 MED ORDER — ASPIRIN EC 81 MG PO TBEC
81.0000 mg | DELAYED_RELEASE_TABLET | Freq: Every day | ORAL | Status: DC
  Administered 2016-10-01 – 2016-10-02 (×2): 81 mg via ORAL
  Filled 2016-10-01 (×2): qty 1

## 2016-10-01 MED ORDER — ALBUTEROL SULFATE (2.5 MG/3ML) 0.083% IN NEBU
2.5000 mg | INHALATION_SOLUTION | RESPIRATORY_TRACT | Status: DC
  Administered 2016-10-01 (×3): 2.5 mg via RESPIRATORY_TRACT
  Filled 2016-10-01 (×3): qty 3

## 2016-10-01 MED ORDER — IPRATROPIUM BROMIDE 0.02 % IN SOLN
0.5000 mg | RESPIRATORY_TRACT | Status: DC
  Administered 2016-10-01 (×3): 0.5 mg via RESPIRATORY_TRACT
  Filled 2016-10-01 (×3): qty 2.5

## 2016-10-01 MED ORDER — IPRATROPIUM BROMIDE 0.02 % IN SOLN
0.5000 mg | RESPIRATORY_TRACT | Status: DC
  Administered 2016-10-02 (×2): 0.5 mg via RESPIRATORY_TRACT
  Filled 2016-10-01 (×2): qty 2.5

## 2016-10-01 MED ORDER — IPRATROPIUM BROMIDE 0.02 % IN SOLN
0.5000 mg | RESPIRATORY_TRACT | Status: DC | PRN
  Administered 2016-10-01 – 2016-10-02 (×2): 0.5 mg via RESPIRATORY_TRACT
  Filled 2016-10-01 (×2): qty 2.5

## 2016-10-01 MED ORDER — GUAIFENESIN ER 600 MG PO TB12
1200.0000 mg | ORAL_TABLET | Freq: Two times a day (BID) | ORAL | Status: DC
  Administered 2016-10-01 – 2016-10-02 (×2): 1200 mg via ORAL
  Filled 2016-10-01 (×2): qty 2

## 2016-10-01 MED ORDER — ALBUTEROL SULFATE (2.5 MG/3ML) 0.083% IN NEBU
2.5000 mg | INHALATION_SOLUTION | RESPIRATORY_TRACT | Status: DC | PRN
  Administered 2016-10-01 – 2016-10-02 (×2): 2.5 mg via RESPIRATORY_TRACT
  Filled 2016-10-01 (×2): qty 3

## 2016-10-01 NOTE — Progress Notes (Signed)
Pt is asleep.  Treatment held at this time per earlier request.

## 2016-10-01 NOTE — Progress Notes (Signed)
Name: Jenna Marquez MRN: [id number] DOB: 1945/12/24    ADMISSION DATE:  09/30/2016 CONSULTATION DATE:  09/30/16  REFERRING MD :  Dr. Carles Collet   CHIEF COMPLAINT:  Shortness of breath    HISTORY OF PRESENT ILLNESS:  71 y/o F who presented to [facility] on 2/16 with complaints of acute onset shortness of breath.    The patient reports she is followed by Dr. Elsworth Soho for asthma / COPD (baseline on symbicort + spiriva and PRN nebs).  She states she normally coughs up thick clear sputum and this has not changed in color or quantity.  She was in her usual state of health 2/16 until early am when she became short of breath.  She used her nebulizer without significant relief.  Also, checked her room air saturations which were 99%.  EMS was activated and the patient was treated with 2gm magnesium, 125 mg solumedrol without relief.  She was placed on bipap and transported to the ER.  Initial ER evaluation included an ABG of 7.342 / 37 / 298/ 19.9, labs Na 141, K 4.5, Cl 108, glucose 136, sr cr 0.89, AST 47, WBC 7.2, Hgb 12.6, MCV 106.6 and platelets 173.  CXR demonstrated changes consistent with COPD and left lung basilar atelectasis/scarring.  The patient was admitted by [facility] 2/16  Upon arrival to ICU, she was taken off bipap and in no distress.  She reports she is ready to go home so she can get a drink of "brandy" and rest.  She denies fevers, chills and can not identify a specific trigger.  However, she reports she did go out to the stores yesterday.  No known sick contacts.  She has not had a flu shot.   PCCM consulted for evaluation of SOB / possible asthma exacerbation.  SUBJECTIVE:  Pt is better from resp standpoint, but at the same time is confused and a bit agitated about her meds, her BD's. She is fixated on the fact that changes have been made to her BD's during acute flare, that she has received solumedrol when she believes she responds better to pred PO, that she has received xopenex instead of albuterol,  etc.    VITAL SIGNS: Temp:  [97.5 F (36.4 C)-98 F (36.7 C)] 97.5 F (36.4 C) (02/17 0516) Pulse Rate:  [88-109] 88 (02/17 0516) Resp:  [20-30] 20 (02/17 0516) BP: (144-174)/(55-75) 144/75 (02/17 0516) SpO2:  [94 %-100 %] 98 % (02/17 1136) FiO2 (%):  [98 %] 98 % (02/16 1958)  PHYSICAL EXAMINATION: Gen: well-nourished, in no distress, somewhat agitated, tangential   ENT: No lesions,  mouth clear,  oropharynx clear, no postnasal drip  Neck: No JVD, she has insp and especially exp stridor  Lungs: No use of accessory muscles, distant, most of her noise is referred from UA. Her overall air movement is improved  Cardiovascular: RRR, heart sounds normal, no murmur or gallops, no peripheral edema  Musculoskeletal: No deformities, no cyanosis or clubbing  Neuro: alert, non focal  Skin: Warm, no lesions or rashes    Recent Labs Lab 09/30/16 0620  NA 141  K 4.5  CL 108  CO2 23  BUN 19  CREATININE 0.89  GLUCOSE 136*     Recent Labs Lab 09/30/16 0351  HGB 12.6  HCT 37.3  WBC 7.2  PLT 173    Dg Chest Port 1 View  Result Date: 09/30/2016 CLINICAL DATA:  Shortness of breath.  History of COPD. EXAM: PORTABLE CHEST 1 VIEW COMPARISON:  Chest radiograph  October 07, 2015 FINDINGS: Cardiomediastinal silhouette is normal. Calcified aortic knob. Dual lead LEFT cardiac pacemaker in situ. Chronic interstitial changes with strandy densities LEFT lung base. No pleural effusions. No pneumothorax. Soft tissue planes and included osseous structures are nonsuspicious. Osteopenia. IMPRESSION: COPD.  LEFT lung base atelectasis/scarring. Electronically Signed   By: [name] M.D.   On: 09/30/2016 04:15    SIGNIFICANT EVENTS  2/16  Admit with acute onset shortness of breath  STUDIES:   MICRO: RVP 2/16 >> negative  ASSESSMENT / PLAN:  1. Acute exacerbation of Asthma / COPD with an UA component- temporarily required BiPAP, now off   2. GOLD C COPD / Asthma - Spirometry  02/2016 shows FEV1 46%, , ratio 35 , FVC 102%, mid flows 20%  3.  Former Tobacco Abuse - uses E cigs on/off   4.  Macrocytosis - query underlying ETOH use  Plan: Attempted to explain current medical regimen especially BD's, the rationale behind adjusting the meds during an acute flare. Mixed success with this. Would transition back to a regimen as close to her home inhalers asap, this will relieve some of her confusion and anxiety Agree with changing solumedrol to prednisone and planning for a taper.  She clearly will prefer albuterol over xopenex.  No more biPAP Will need f/u w Dr Elsworth Soho on d/c home.    Baltazar Apo, MD, PhD 10/01/2016, 12:43 PM [facility] [phone] or if no answer [phone]

## 2016-10-01 NOTE — Progress Notes (Signed)
Jenna Marquez [id number] DOB: September 21, 1945 DOA: 09/30/2016 PCP: Lamar Blinks, MD  Brief narrative:  71 y/o ? GOLD COPD3  CTA Chest 08/2015 , neg PE, COPD changes Spirometry 02/2016 shows FEV1 46%, , ratio 35 , FVC 102%, mid flows 20%.  Former smoker Using E-cig Complete HB, MObitz 2:1 s/p PPM 01/08/2015 Has PAF CHad2Vasc2=2 and refuses AC CKD stg III Intermittent HA/Vague tinnitus seen in past Dr. Krista Blue [facility]  Admit SOB and anxiety Found to have acute sob, AECOPD Started on IV solumedrol, PCCM consulted felt viral illness-REsp viral panel pending Labs relatviely stable COPD on CXR  Past medical history-As per Problem list Chart reviewed as below-   Consultants:    Procedures:    Antibiotics:     Subjective   Litany of unrelated concerns and complaints--abble to verbalize all of these without breathlessness Have talked with her and listened to all of her concerns regarding shortness of breath , room service, med changes which we have made while hopsitalized     Objective      Objective: Vitals:   09/30/16 1748 09/30/16 2022 10/01/16 0512 10/01/16 0516  BP:  (!) 147/58  (!) 144/75  Pulse:  91  88  Resp:  20  20  Temp:  98 F (36.7 C)  97.5 F (36.4 C)  TempSrc:  Oral  Oral  SpO2: 94% 98% 99% 100%  Weight:      Height:        Intake/Output Summary (Last 24 hours) at 10/01/16 0741 Last data filed at 10/01/16 0517  Gross per 24 hour  Intake              363 ml  Output                1 ml  Net              362 ml    Exam:  General: eomi ncat Cardiovascular: s1 s2 no m/r/g Respiratory: clear ant with some mild wheeze which is transmitted when asked to take deep breaths Abdomen:  Soft nt nd no rebound no gaurd Skin intact Neuro intact moves 4 limbs equally  Data Reviewed: Basic Metabolic Panel:  Recent Labs Lab 09/30/16 0620  NA 141  K 4.5  CL 108  CO2 23  GLUCOSE 136*  BUN 19  CREATININE 0.89  CALCIUM 8.9  MG 2.1   Liver  Function Tests: No results for input(s): AST, ALT, ALKPHOS, BILITOT, PROT, ALBUMIN in the last 168 hours. No results for input(s): LIPASE, AMYLASE in the last 168 hours. No results for input(s): AMMONIA in the last 168 hours. CBC:  Recent Labs Lab 09/30/16 0351  WBC 7.2  NEUTROABS 4.4  HGB 12.6  HCT 37.3  MCV 106.6*  PLT 173   Cardiac Enzymes:  Recent Labs Lab 09/30/16 0620  TROPONINI <0.03   BNP: Invalid input(s): POCBNP CBG: No results for input(s): GLUCAP in the last 168 hours.  Recent Results (from the past 240 hour(s))  Respiratory Panel by PCR     Status: None   Collection Time: 09/30/16  6:59 AM  Result Value Ref Range Status   Adenovirus NOT DETECTED NOT DETECTED Final   Coronavirus 229E NOT DETECTED NOT DETECTED Final   Coronavirus HKU1 NOT DETECTED NOT DETECTED Final   Coronavirus NL63 NOT DETECTED NOT DETECTED Final   Coronavirus OC43 NOT DETECTED NOT DETECTED Final   Metapneumovirus NOT DETECTED NOT DETECTED Final   Rhinovirus / Enterovirus NOT DETECTED NOT DETECTED Final  Influenza A NOT DETECTED NOT DETECTED Final   Influenza B NOT DETECTED NOT DETECTED Final   Parainfluenza Virus 1 NOT DETECTED NOT DETECTED Final   Parainfluenza Virus 2 NOT DETECTED NOT DETECTED Final   Parainfluenza Virus 3 NOT DETECTED NOT DETECTED Final   Parainfluenza Virus 4 NOT DETECTED NOT DETECTED Final   Respiratory Syncytial Virus NOT DETECTED NOT DETECTED Final   Bordetella pertussis NOT DETECTED NOT DETECTED Final   Chlamydophila pneumoniae NOT DETECTED NOT DETECTED Final   Mycoplasma pneumoniae NOT DETECTED NOT DETECTED Final    Comment: Performed at [facility], [place] [address] [place], [place] [address]  MRSA PCR Screening     Status: None   Collection Time: 09/30/16  9:34 AM  Result Value Ref Range Status   MRSA by PCR NEGATIVE NEGATIVE Final    Comment:        The GeneXpert MRSA Assay (FDA approved for NASAL specimens only), is one component of  a comprehensive MRSA colonization surveillance program. It is not intended to diagnose MRSA infection nor to guide or monitor treatment for MRSA infections.      Studies:              All Imaging reviewed and is as per above notation   Scheduled Meds: . albuterol  2.5 mg Nebulization Q4H  . aspirin EC  81 mg Oral Daily  . B-complex with vitamin C  1 tablet Oral Daily  . budesonide (PULMICORT) nebulizer solution  0.5 mg Nebulization BID  . calcium-vitamin D  0.5 tablet Oral Daily  . cholecalciferol  2,000 Units Oral Daily  . enoxaparin (LOVENOX) injection  40 mg Subcutaneous Q24H  . guaiFENesin  1,200 mg Oral BID  . Influenza vac split quadrivalent PF  0.5 mL Intramuscular Tomorrow-1000  . ipratropium  0.5 mg Nebulization Q4H  . multivitamin with minerals  2 tablet Oral q morning - 10a  . predniSONE  60 mg Oral QAC breakfast  . sodium chloride flush  3 mL Intravenous Q12H   Continuous Infusions:   Assessment/Plan:  1. Acute exacerbation GOld COPD III-transition steroids solu-medrol to po prednisone at patient insistence.  Upset about xopenex 'doesn't work'--changed to albuterol.  Wanting Symbicort-explained need spiriva.  I spent a good 15 min trying to come to a good compromise and believe we we successful.  Patient then states she "wants to go to [facility]" and requested also I get in touch with Dr. Georgeanne Nim Dr. Elsworth Soho might not be available today--She agrees to see on call MD whose input is appreciated. 2. CHB with MObitz 2:1 s/p PPM 12/2014-monitor on telemetry 3. Afib not on AC-as patient refuses-ASA 81 mg.  Refusing blood thinner 4. CKD stg I-iii-stabkle currently   Tele Full ciode Likely d/c home 2/18 unless leave against Med advice  Verneita Griffes, MD  [facility] Pager [phone] 10/01/2016, 7:41 AM    LOS: 1 day

## 2016-10-01 NOTE — Progress Notes (Signed)
Patient does not wish to be awakened in the night for nebulizer treatments. However, she wishes to received both of the medications available to her when she awakens and calls for a treatment. Assured her that both albuterol and atrovent are available to her throughout the night if she were to call and require therapy. She appears less anxious in knowing this information.

## 2016-10-01 NOTE — Procedures (Signed)
Responded to call for prn treatment. Upon arrival pt in no respiratory distress.  No use of accessory muscles, no wheezes, SpO2-100% on room air.  Treatment given as per pt requested.  Pt became irate and stated that "the strength of her treatment" was not correct.  Informed pt that I cannot change the "strength" of her treatment and explained treatment regiment.

## 2016-10-01 NOTE — Progress Notes (Signed)
Patient appears agitated this am. Pt feels that she has not received her normal medication regimen. Discussed current diagnosis and reason for changes in normal medication regimen. Pt is not agreeable at this time and request to leave AMA.

## 2016-10-01 NOTE — Progress Notes (Signed)
Patient asleep at this time. Appears comfortable. NAD noted. VSS on room air.

## 2016-10-02 MED ORDER — ALBUTEROL SULFATE HFA 108 (90 BASE) MCG/ACT IN AERS: 2.0000 | INHALATION_SPRAY | Freq: Four times a day (QID) | RESPIRATORY_TRACT | 2 refills | Status: DC | PRN

## 2016-10-02 MED ORDER — PREDNISONE 20 MG PO TABS: 60.0000 mg | ORAL_TABLET | Freq: Every day | ORAL | 0 refills | Status: DC

## 2016-10-02 MED ORDER — GUAIFENESIN ER 600 MG PO TB12: 1200.0000 mg | ORAL_TABLET | Freq: Two times a day (BID) | ORAL | 0 refills | Status: AC

## 2016-10-02 MED ORDER — BUDESONIDE-FORMOTEROL FUMARATE 160-4.5 MCG/ACT IN AERO: 2.0000 | INHALATION_SPRAY | Freq: Two times a day (BID) | RESPIRATORY_TRACT | 1 refills | Status: DC

## 2016-10-02 MED ORDER — ASPIRIN 81 MG PO TBEC: 81.0000 mg | DELAYED_RELEASE_TABLET | Freq: Every day | ORAL | 0 refills | Status: DC

## 2016-10-02 NOTE — Progress Notes (Signed)
Administered prn nebulizer per patient wishes before walking. VSS on room air. Patient states she has taken a nap and would now like to take a walk prior to taking any medicines that will make her sleep through the night. She again states she does not wish to be awakened in the night and agrees to call if she should need further nebulizer treatments.

## 2016-10-02 NOTE — Progress Notes (Signed)
Pulmicort neb scheduled for 0800 given per patient request to take it early.

## 2016-10-02 NOTE — Progress Notes (Signed)
Asleep. Appears comfortable at this time. Remains on room air.

## 2016-10-02 NOTE — Progress Notes (Signed)
Call received for patient requesting nebulizer therapy. Diffuse bilateral wheezing noted. Strong productive cough of moderate amounts of thick white secretions. Mildly tachycardic with some tachypnea. Patient states she is having "a panic attack". RN aware.

## 2016-10-02 NOTE — Discharge Summary (Signed)
Physician Discharge Summary  Jenna Marquez [id number] DOB: 17-Nov-1945 DOA: 09/30/2016  PCP: Lamar Blinks, MD  Admit date: 09/30/2016 Discharge date: 10/02/2016  Time spent: 45 minutes  Recommendations for Outpatient Follow-up:  1. steroid burst ordered Refilled albuterol inhaler and symbicort-follow with pulm if cannot refill due to donut hole 2. recheck in 1-2 weeks  Discharge Diagnoses:  Active Problems:   COPD exacerbation (HCC)   Pacemaker   Paroxysmal atrial fibrillation (HCC)   Acute respiratory failure with hypoxia (HCC)   Mobitz type 2 second degree AV block   CKD (chronic kidney disease) stage 2, GFR 60-89 ml/min   Discharge Condition: improved  Diet recommendation: hh low salt   Filed Weights   09/30/16 0815  Weight: 63.5 kg (139 lb 15.9 oz)    History of present illness:  71 y/o ? GOLD COPD3             CTA Chest 08/2015 , neg PE, COPD changes Spirometry 7/2017shows FEV1 46%, , ratio 35 , FVC 102%, mid flows 20%.  Former smoker Using E-cig Complete HB, MObitz 2:1 s/p PPM 01/08/2015 Has PAF CHad2Vasc2=2 and refuses AC CKD stg III Intermittent HA/Vague tinnitus seen in past Dr. Krista Blue Neurology  Admit SOB and anxiety Found to have acute sob, AECOPD Started on IV solumedrol, PCCM consulted felt viral illness-REsp viral panel pending Labs relatviely stable COPD on CXR  She was fair over hospitalization and had some wheezing but seemed to resolve to the point of being more stable on d/c to home.  We transitioned her to oral steroids under guidance of Dr. Lamonte Sakai who was consulted, answered her questions regarding her meds as she is used to taking her meds in a specific weay and has a strong preference for meds done in that way She was hemodynamically stable for d/c on 10/02/16 and refills of meds were called in at her requests She was also started on ASA 81 for cvhr afib--refuses AC--thsi should be discussed as an OP with her Her anxiety definetly plays a role  in her illness and emotional lability and should be monitored and treated  Discharge Exam: Vitals:   10/02/16 0533 10/02/16 0720  BP: 138/82   Pulse: (!) 118 (!) 111  Resp: (!) 24 20  Temp: 97.6 F (36.4 C)    Alert pleasant has questions which were all answered General: eomi ncat Cardiovascular: s1 s2no m/r/g Respiratory: clear no added sound  Discharge Instructions   Discharge Instructions    Diet - low sodium heart healthy    Complete by:  As directed    Discharge instructions    Complete by:  As directed    We will refill albuterol inhaler and symbicort You will need Nebulization for the next 2-3 days every 4 hours.  You can use it every 3 hours if things get bad for a short period of time I will prescribe Prednisone 60 mg for 5 days.  Please complete all of them. You will also need atrovent nebs every 4--discuss with your pulmonary doc if you should go back on spiriva which it the puffer formulation of this once things get better Best of luck   Increase activity slowly    Complete by:  As directed      Current Discharge Medication List    START taking these medications   Details  aspirin EC 81 MG EC tablet Take 1 tablet (81 mg total) by mouth daily. Qty: 30 tablet, Refills: 0    predniSONE (DELTASONE) 20 MG  tablet Take 3 tablets (60 mg total) by mouth daily before breakfast. Qty: 15 tablet, Refills: 0      CONTINUE these medications which have CHANGED   Details  albuterol (PROVENTIL HFA;VENTOLIN HFA) 108 (90 Base) MCG/ACT inhaler Inhale 2 puffs into the lungs every 6 (six) hours as needed for wheezing or shortness of breath. Qty: 1 Inhaler, Refills: 2   Associated Diagnoses: Chronic obstructive pulmonary disease, unspecified COPD type (HCC)    budesonide-formoterol (SYMBICORT) 160-4.5 MCG/ACT inhaler Inhale 2 puffs into the lungs 2 (two) times daily. Qty: 3 Inhaler, Refills: 1    guaiFENesin (MUCINEX) 600 MG 12 hr tablet Take 2 tablets (1,200 mg total) by  mouth 2 (two) times daily. Qty: 60 tablet, Refills: 0      CONTINUE these medications which have NOT CHANGED   Details  albuterol (PROVENTIL) (2.5 MG/3ML) 0.083% nebulizer solution 4 TIMES DAILY DX: J44.9 Qty: 1080 mL, Refills: 0   Associated Diagnoses: Chronic obstructive pulmonary disease, unspecified COPD type (HCC)    ALPRAZolam (XANAX) 0.5 MG tablet Take 1 tablet (0.5 mg total) by mouth at bedtime as needed for sleep (help for TMJ). Do not take with alcohol Qty: 15 tablet, Refills: 0    calcium-vitamin D (OSCAL WITH D) 250-125 MG-UNIT tablet Take 1 tablet by mouth daily.    Cholecalciferol (VITAMIN D) 2000 UNITS CAPS Take 1 capsule by mouth daily.    guaifenesin (ROBITUSSIN) 100 MG/5ML syrup Take 200 mg by mouth 3 (three) times daily as needed for cough.    ibuprofen (ADVIL,MOTRIN) 800 MG tablet TAKE 1 TABLET BY MOUTH EVERY 8 HOURS AS NEEDED Qty: 60 tablet, Refills: 0    ipratropium (ATROVENT) 0.02 % nebulizer solution Inhale 2.5 mLs (0.5 mg total) into the lungs 4 (four) times daily. DX J44.9 Qty: 360 mL, Refills: 1   Associated Diagnoses: Chronic obstructive pulmonary disease, unspecified COPD type (HCC)    Multiple Vitamin (MULTIVITAMIN) tablet Take 2 tablets by mouth every morning.       STOP taking these medications     B Complex Vitamins (B COMPLEX-B12 PO)        Allergies  Allergen Reactions  . Avelox [Moxifloxacin Hcl In Nacl] Itching and Rash   Follow-up Information    Rexene Edison, NP Follow up on 10/10/2016.   Specialty:  Pulmonary Disease Why:  Appt at 11:45 AM Contact information: [address] [place] [address] [phone]            The results of significant diagnostics from this hospitalization (including imaging, microbiology, ancillary and laboratory) are listed below for reference.    Significant Diagnostic Studies: Dg Chest Port 1 View  Result Date: 09/30/2016 CLINICAL DATA:  Shortness of breath.  History of COPD. EXAM:  PORTABLE CHEST 1 VIEW COMPARISON:  Chest radiograph October 07, 2015 FINDINGS: Cardiomediastinal silhouette is normal. Calcified aortic knob. Dual lead LEFT cardiac pacemaker in situ. Chronic interstitial changes with strandy densities LEFT lung base. No pleural effusions. No pneumothorax. Soft tissue planes and included osseous structures are nonsuspicious. Osteopenia. IMPRESSION: COPD.  LEFT lung base atelectasis/scarring. Electronically Signed   By: [name] M.D.   On: 09/30/2016 04:15    Microbiology: Recent Results (from the past 240 hour(s))  Respiratory Panel by PCR     Status: None   Collection Time: 09/30/16  6:59 AM  Result Value Ref Range Status   Adenovirus NOT DETECTED NOT DETECTED Final   Coronavirus 229E NOT DETECTED NOT DETECTED Final   Coronavirus HKU1  NOT DETECTED NOT DETECTED Final   Coronavirus NL63 NOT DETECTED NOT DETECTED Final   Coronavirus OC43 NOT DETECTED NOT DETECTED Final   Metapneumovirus NOT DETECTED NOT DETECTED Final   Rhinovirus / Enterovirus NOT DETECTED NOT DETECTED Final   Influenza A NOT DETECTED NOT DETECTED Final   Influenza B NOT DETECTED NOT DETECTED Final   Parainfluenza Virus 1 NOT DETECTED NOT DETECTED Final   Parainfluenza Virus 2 NOT DETECTED NOT DETECTED Final   Parainfluenza Virus 3 NOT DETECTED NOT DETECTED Final   Parainfluenza Virus 4 NOT DETECTED NOT DETECTED Final   Respiratory Syncytial Virus NOT DETECTED NOT DETECTED Final   Bordetella pertussis NOT DETECTED NOT DETECTED Final   Chlamydophila pneumoniae NOT DETECTED NOT DETECTED Final   Mycoplasma pneumoniae NOT DETECTED NOT DETECTED Final    Comment: Performed at [facility], [place] [address] [place], [place] [address]  MRSA PCR Screening     Status: None   Collection Time: 09/30/16  9:34 AM  Result Value Ref Range Status   MRSA by PCR NEGATIVE NEGATIVE Final    Comment:        The GeneXpert MRSA Assay (FDA approved for NASAL specimens only), is one component of  a comprehensive MRSA colonization surveillance program. It is not intended to diagnose MRSA infection nor to guide or monitor treatment for MRSA infections.      Labs: Basic Metabolic Panel:  Recent Labs Lab 09/30/16 0620  NA 141  K 4.5  CL 108  CO2 23  GLUCOSE 136*  BUN 19  CREATININE 0.89  CALCIUM 8.9  MG 2.1   Liver Function Tests: No results for input(s): AST, ALT, ALKPHOS, BILITOT, PROT, ALBUMIN in the last 168 hours. No results for input(s): LIPASE, AMYLASE in the last 168 hours. No results for input(s): AMMONIA in the last 168 hours. CBC:  Recent Labs Lab 09/30/16 0351  WBC 7.2  NEUTROABS 4.4  HGB 12.6  HCT 37.3  MCV 106.6*  PLT 173   Cardiac Enzymes:  Recent Labs Lab 09/30/16 0620  TROPONINI <0.03   BNP: BNP (last 3 results) No results for input(s): BNP in the last 8760 hours.  ProBNP (last 3 results) No results for input(s): PROBNP in the last 8760 hours.  CBG: No results for input(s): GLUCAP in the last 168 hours.     SignedNita Sells MD   [facility] 10/02/2016, 9:25 AM

## 2016-10-02 NOTE — Progress Notes (Signed)
Pt has taken 2 long walks tonight. Up walking each time for at least 20 mins.   Continues to refuse to let us take her sats while walking.  However, during both walks pt noted to be talking almost constantly.  She did have some DOE/pursed lip breathing but NAD.  Sats on RA at rest remain mid 90's.

## 2016-10-06 ENCOUNTER — Encounter: Payer: Self-pay | Admitting: Adult Health

## 2016-10-06 ENCOUNTER — Ambulatory Visit (INDEPENDENT_AMBULATORY_CARE_PROVIDER_SITE_OTHER): Payer: PPO | Admitting: Adult Health

## 2016-10-06 DIAGNOSIS — J449 Chronic obstructive pulmonary disease, unspecified: Secondary | ICD-10-CM

## 2016-10-06 MED ORDER — ALBUTEROL SULFATE (2.5 MG/3ML) 0.083% IN NEBU: 2.5000 mg | INHALATION_SOLUTION | RESPIRATORY_TRACT | 12 refills | Status: DC | PRN

## 2016-10-06 MED ORDER — TIOTROPIUM BROMIDE MONOHYDRATE 18 MCG IN CAPS: 18.0000 ug | ORAL_CAPSULE | Freq: Every day | RESPIRATORY_TRACT | 11 refills | Status: DC

## 2016-10-06 MED ORDER — ALBUTEROL SULFATE (2.5 MG/3ML) 0.083% IN NEBU: 2.5000 mg | INHALATION_SOLUTION | Freq: Two times a day (BID) | RESPIRATORY_TRACT | 0 refills | Status: DC

## 2016-10-06 NOTE — Progress Notes (Signed)
@Patient  ID: Jenna Marquez, female    DOB: 1945-11-08, 71 y.o.   MRN: [id number]  Chief Complaint  Patient presents with  . Hospitalization Follow-up    Referring provider: Copland, Gay Filler, MD  HPI: 71 yo female with COPD -GOLD 3 , former smoker .  Uses e cigs on/off   Significant tests/ events 09/2010 FeV1 49% Fef 25 75 22%   Ct chest 07/2012 - no nodules/copd changes  CTA Chest 08/2015 , neg PE, COPD changes  Spirometry 02/2016 shows FEV1 46%, , ratio 35 , FVC 102%, mid flows 20%.   10/06/2016 Follow up : COPD exacerbation /Post hospital  Patient returns for a post hospital follow-up. Patient was admitted last week for a COPD exacerbation. Patient presented with respiratory distress. She required brief  BiPAP support. SHe was treated with IV abx, steroids and neb. She is feeling better since discharge. Remains weak. Has some lingering dry cough . No fever or chest pain.  Has few days of prednisone left.  Should like to go back on Spiriva and get off atrovent neb. She was changed in past for cost issues.  Says she does not care about dogughtnut hole anymore.     Allergies  Allergen Reactions  . Avelox [Moxifloxacin Hcl In Nacl] Itching and Rash    Immunization History  Administered Date(s) Administered  . Influenza Split 07/14/2011, 07/24/2012, 04/29/2013  . Influenza,inj,Quad PF,36+ Mos 08/28/2014, 09/22/2015, 10/02/2016  . Pneumococcal Conjugate-13 10/30/2014  . Pneumococcal Polysaccharide-23 07/14/2011  . Zoster 03/23/2011    Past Medical History:  Diagnosis Date  . Allergic rhinitis   . Anxiety    "occasionally"  . Asthma   . COPD (chronic obstructive pulmonary disease) ([place])   . GERD (gastroesophageal reflux disease)   . Mobitz type 2 second degree AV block 01/06/2015    Tobacco History: History  Smoking Status  . Former Smoker  . Packs/day: 1.00  . Years: 35.00  . Types: Cigarettes  . Quit date: 08/10/2012  Smokeless Tobacco  . Never Used   Comment: Started at age 47   Counseling given: Not Answered   Outpatient Encounter Prescriptions as of 10/06/2016  Medication Sig  . albuterol (PROVENTIL HFA;VENTOLIN HFA) 108 (90 Base) MCG/ACT inhaler Inhale 2 puffs into the lungs every 6 (six) hours as needed for wheezing or shortness of breath.  Marland Kitchen albuterol (PROVENTIL) (2.5 MG/3ML) 0.083% nebulizer solution 4 TIMES DAILY DX: J44.9  . ALPRAZolam (XANAX) 0.5 MG tablet Take 1 tablet (0.5 mg total) by mouth at bedtime as needed for sleep (help for TMJ). Do not take with alcohol  . aspirin EC 81 MG EC tablet Take 1 tablet (81 mg total) by mouth daily.  . budesonide-formoterol (SYMBICORT) 160-4.5 MCG/ACT inhaler Inhale 2 puffs into the lungs 2 (two) times daily.  . calcium-vitamin D (OSCAL WITH D) 250-125 MG-UNIT tablet Take 1 tablet by mouth daily.  . Cholecalciferol (VITAMIN D) 2000 UNITS CAPS Take 1 capsule by mouth daily.  Marland Kitchen guaiFENesin (MUCINEX) 600 MG 12 hr tablet Take 2 tablets (1,200 mg total) by mouth 2 (two) times daily.  Marland Kitchen guaifenesin (ROBITUSSIN) 100 MG/5ML syrup Take 200 mg by mouth 3 (three) times daily as needed for cough.  Marland Kitchen ibuprofen (ADVIL,MOTRIN) 800 MG tablet TAKE 1 TABLET BY MOUTH EVERY 8 HOURS AS NEEDED  . ipratropium (ATROVENT) 0.02 % nebulizer solution Inhale 2.5 mLs (0.5 mg total) into the lungs 4 (four) times daily. DX J44.9  . Multiple Vitamin (MULTIVITAMIN) tablet Take 2 tablets by mouth  every morning.   . predniSONE (DELTASONE) 20 MG tablet Take 3 tablets (60 mg total) by mouth daily before breakfast.   No facility-administered encounter medications on file as of 10/06/2016.      Review of Systems  Constitutional:   No  weight loss, night sweats,  Fevers, chills,  +fatigue, or  lassitude.  HEENT:   No headaches,  Difficulty swallowing,  Tooth/dental problems, or  Sore throat,                No sneezing, itching, ear ache, nasal congestion, post nasal drip,   CV:  No chest pain,  Orthopnea, PND, swelling in  lower extremities, anasarca, dizziness, palpitations, syncope.   GI  No heartburn, indigestion, abdominal pain, nausea, vomiting, diarrhea, change in bowel habits, loss of appetite, bloody stools.   Resp:  No chest wall deformity  Skin: no rash or lesions.  GU: no dysuria, change in color of urine, no urgency or frequency.  No flank pain, no hematuria   MS:  No joint pain or swelling.  No decreased range of motion.  No back pain.    Physical Exam  BP 132/69 (BP Location: Right Arm, Cuff Size: Normal)   Pulse 77   Ht 5\' 6"  (1.676 m)   Wt 143 lb (64.9 kg)   SpO2 93%   BMI 23.08 kg/m   GEN: A/Ox3; pleasant , NAD, elderly    HEENT:  [place]/AT,  EACs-clear, TMs-wnl, NOSE-clear, THROAT-clear, no lesions, no postnasal drip or exudate noted.   NECK:  Supple w/ fair ROM; no JVD; normal carotid impulses w/o bruits; no thyromegaly or nodules palpated; no lymphadenopathy.    RESP  Decreased BS in bases   no accessory muscle use, no dullness to percussion  CARD:  RRR, no m/r/g, no peripheral edema, pulses intact, no cyanosis or clubbing.  GI:   Soft & nt; nml bowel sounds; no organomegaly or masses detected.   Musco: Warm bil, no deformities or joint swelling noted.   Neuro: alert, no focal deficits noted.    Skin: Warm, no lesions or rashes    Lab Results:  CBC  BMET  BNP No results found for: BNP  ProBNP  Imaging: Dg Chest Port 1 View  Result Date: 09/30/2016 CLINICAL DATA:  Shortness of breath.  History of COPD. EXAM: PORTABLE CHEST 1 VIEW COMPARISON:  Chest radiograph October 07, 2015 FINDINGS: Cardiomediastinal silhouette is normal. Calcified aortic knob. Dual lead LEFT cardiac pacemaker in situ. Chronic interstitial changes with strandy densities LEFT lung base. No pleural effusions. No pneumothorax. Soft tissue planes and included osseous structures are nonsuspicious. Osteopenia. IMPRESSION: COPD.  LEFT lung base atelectasis/scarring. Electronically Signed   By:  [name] M.D.   On: 09/30/2016 04:15     Assessment & Plan:   No problem-specific Assessment & Plan notes found for this encounter.     Rexene Edison, NP 10/06/2016

## 2016-10-06 NOTE — Patient Instructions (Addendum)
Finish Prednisone taper as directed.  Continue on Symbicort 2 puffs Twice daily   Restart Spiriva 1 puff daily  Stop ipratropium nebs.  May use Albuterol Neb Twice daily  .  May use Proventil Inhaler 2 puffs every 4hrs or Albuterol Neb every 4hrs as needed for wheezing -this is your rescue medication.  Follow up Dr. Elsworth Soho in 2  months  At [facility] office.  Please contact office for sooner follow up if symptoms do not improve or worsen or seek emergency care

## 2016-10-07 ENCOUNTER — Other Ambulatory Visit: Payer: Self-pay | Admitting: Family Medicine

## 2016-10-10 ENCOUNTER — Inpatient Hospital Stay: Payer: PPO | Admitting: Adult Health

## 2016-10-11 ENCOUNTER — Other Ambulatory Visit: Payer: Self-pay | Admitting: Family Medicine

## 2016-10-11 ENCOUNTER — Other Ambulatory Visit: Payer: Self-pay | Admitting: Emergency Medicine

## 2016-10-11 MED ORDER — IBUPROFEN 800 MG PO TABS: 800.0000 mg | ORAL_TABLET | Freq: Three times a day (TID) | ORAL | 0 refills | Status: DC | PRN

## 2016-10-11 NOTE — Telephone Encounter (Signed)
Received refill request for IBUPROFEN 800 MG TABLET. Last office visit 11/23/15 and last refill 05/06/16. Is it ok to refill? Please advise.

## 2016-10-12 ENCOUNTER — Other Ambulatory Visit: Payer: Self-pay | Admitting: Emergency Medicine

## 2016-10-12 NOTE — Telephone Encounter (Signed)
Received refill request on ibuprofen (ADVIL, MOTRIN) 800 MG tablet. Last office visit 11/23/15 and last refill 10/11/16. Is it ok to refill? Please advise.

## 2016-10-14 NOTE — Assessment & Plan Note (Signed)
Recent Flare w/ hospitalization now improving   Plan  Patient Instructions  Finish Prednisone taper as directed.  Continue on Symbicort 2 puffs Twice daily   Restart Spiriva 1 puff daily  Stop ipratropium nebs.  May use Albuterol Neb Twice daily  .  May use Proventil Inhaler 2 puffs every 4hrs or Albuterol Neb every 4hrs as needed for wheezing -this is your rescue medication.  Follow up Dr. Elsworth Soho in 2  months  At [facility] office.  Please contact office for sooner follow up if symptoms do not improve or worsen or seek emergency care

## 2016-11-21 ENCOUNTER — Ambulatory Visit (HOSPITAL_BASED_OUTPATIENT_CLINIC_OR_DEPARTMENT_OTHER)
Admission: RE | Admit: 2016-11-21 | Discharge: 2016-11-21 | Disposition: A | Discharge status: Home / Self Care | Payer: PPO | Source: Ambulatory Visit | Attending: Internal Medicine | Admitting: Internal Medicine

## 2016-11-21 ENCOUNTER — Other Ambulatory Visit: Payer: Self-pay | Admitting: Family Medicine

## 2016-11-21 ENCOUNTER — Encounter (HOSPITAL_COMMUNITY): Payer: PPO

## 2016-11-21 ENCOUNTER — Telehealth: Payer: Self-pay | Admitting: Cardiology

## 2016-11-21 DIAGNOSIS — M7989 Other specified soft tissue disorders: Secondary | ICD-10-CM

## 2016-11-21 NOTE — Telephone Encounter (Signed)
Transmission still not received as of 11/21/16 at 1743.  Will f/u with patient tomorrow.

## 2016-11-21 NOTE — Telephone Encounter (Signed)
Patient calling about left leg swelling, tightness and redness that has moved from the ankle up the leg. Patient also complaining of swelling in her right ankle. Patient is SOB, and has hx of COPD and asthma. Patient also has history of AFIB and has not been anticoagulated. Patient has refused anticoagulants in the past. Patient stated she would like to start taking them now. Consulted (DOD) Dr. Caryl Comes for advisement. He recommend patient get a venous US of lower extremities. Patient also reports that her HR 107- 110. Patient has dual pacemaker - will have her send transmission. Patient verbalized understanding.  Made patient an appointment at [facility] for ultra sound.

## 2016-11-21 NOTE — Telephone Encounter (Signed)
Spoke w/ pt and attempted to help her send a remote transmission. After several unsuccessful attempts instructed pt to call tech support for further help trouble shooting her monitor. Pt verbalized understanding.

## 2016-11-21 NOTE — Telephone Encounter (Signed)
New Message     Left swollen is greatly swollen and red, has red line going up leg and warm to the touch

## 2016-11-21 NOTE — Telephone Encounter (Signed)
New message     Pt c/o swelling: STAT is pt has developed SOB within 24 hours  1. How long have you been experiencing swelling? Since last saturday  2. Where is the swelling located? Feet and legs , they are red   3.  Are you currently taking a "fluid pill"? no  4.  Are you currently SOB? Yes , pt states she has copd 5.  Have you traveled recently? No

## 2016-11-22 ENCOUNTER — Telehealth: Payer: Self-pay | Admitting: Cardiology

## 2016-11-22 NOTE — Telephone Encounter (Signed)
Follow Up:   Pt would like her doppler results from her test she had yesterday at [place] in [facility], Pt feet are red and swollen.Pt is waiting for her results,she needs some relief,

## 2016-11-22 NOTE — Telephone Encounter (Signed)
Follow up      Patient calling wants test results of doppler.    Patient wants to know what should she do about swollen feet's

## 2016-11-22 NOTE — Telephone Encounter (Signed)
Informed pt that venous dopplers were negative for DVT. Report swelling/redness is in the ankle/feet, not legs. Bilateral redness is lighter today, states it is fading away. Reports right foot not swollen but slight redness and left toes/foot/ankle red and swollen. Denies areas are warm to the touch. States "maybe something bit me". Pt asking for PRN Lasix that was discussed at last OV w/ [facility]. Informed patient that she would have to be assessed before prescribing medication/s.  Explained that we would need to physically assess areas to determine if lasix is appropriate or if she is experiencing cellulitis, requiring abx. Offered pt appt tomorrow w/ extender but she refused b/c too far from home in [place].  Pt states she will go to [place] early tomorrow morning to get issue addressed.   Pt understands I will read their assessment to determine if she needs follow up w/ Dr. Curt Bears. Patient verbalized understanding and agreeable to plan.

## 2016-11-23 ENCOUNTER — Ambulatory Visit: Payer: PPO | Admitting: Nurse Practitioner

## 2016-11-23 NOTE — Progress Notes (Addendum)
[place] [facility] [address], [place], [place] [address] [phone]  Date:  11/24/2016   Name:  Jenna Marquez   DOB:  1945-12-18   MRN:  [id number]  PCP:  Lamar Blinks, MD    Chief Complaint: Annual Exam (Pt here for CPE and is fasting for labs. c/o wheezing of SOB, pt hx of COPD asthma and will see Pulmonary doctor today. )   History of Present Illness:  Jenna Marquez is a 71 y.o. very pleasant female patient who presents with the following:  Last seen by myself about one year ago- HPI from that visit  History of COPD, former smoker, anxiety, CKD, pacemaker here today for a CPE She is s/p pneumovax over age 40 and alos prevnar, xostavax and flu UTD Colonoscopy 2014 mammo 10/16 Sees Dr. Elsworth Soho for pulmonology and Dr. Lovena Le for cardiology (admits she has not followed up with cards as she should however)  In the hospital in February with a COPD exacerbation Most recent electrophysiology note from January shows the following A/P 1.  2:1 AV block: Dual-chamber pacemaker in place, functioning appropriately. No changes necessary at this time. She is tachycardic today, but she does say that this is due to the breathing treatments that she has done.  2. Paroxysmal atrial fibrillation: Incidentally found on her monitor. She was asymptomatic. She has a CHADS2VASc of 2 for age and female gender. We discussed the options of anticoagulation including Coumadin versus normal anticoagulants. She continues to refuse anticoagulation. I told her that if she changes her mind to call the clinic and we'll have further discussions.  Here today seeking a CPE- history of COPD, CKD, pacemaker She had a BMP and A1c in February of this year She is not on medication for her BP- this normally looks ok, suspect high today as she has been using albuterol and also prednisone at home Flu shot, colonoscopy, mammogram are all UTD  She is seeing Dr.  Elsworth Soho upstairs later today- pulmonology She did a neb treatment (duoneb) at home earlier today but is still feeling quite SOB We gave her another duoneb here in clinic that did help  She admits to feeling nervous at night- esp since her god-son moved back to [place] and she is living alone.  She had a large house on an acre of land and is pretty secluded. She will occasionally take a xanax at night to help herself sleep, and she does have an alarm.   She has thought about moving to a smaller place but feels overwhelmed by the thought of moving  Today is Thursday. She had an Korea of her left foot on Monday of this week- this was negative for a DVT.  This past Sunday she noted that both of her ankles were swollen.  The right got better but her left foot continues to be puffy. The foot and ankle do not hurt, but they feel "tight" across her ankle The foot and ankle had appeared red, but this is getting better.  Her cardiologist ordered the doppler but asked her to see her PCP to check for any evidence of cellulitis  She does use her treadmill and bike (which are in her basement- she will only go into the basement if someone else is home with her as she gets scared)  She thinks that her last tetanus may be 71 years old (or more)- however she does not wish to get this today Her husband  died 5 years ago   BP Readings from Last 3 Encounters:  11/24/16 (!) 151/64  10/06/16 132/69  10/02/16 (!) 147/49    Lab Results  Component Value Date   HGBA1C 4.6 (L) 09/30/2016     Chemistry      Component Value Date/Time   NA 141 09/30/2016 0620   K 4.5 09/30/2016 0620   CL 108 09/30/2016 0620   CO2 23 09/30/2016 0620   BUN 19 09/30/2016 0620   CREATININE 0.89 09/30/2016 0620      Component Value Date/Time   CALCIUM 8.9 09/30/2016 0620   ALKPHOS 112 11/25/2015 0749   AST 47 (H) 11/25/2015 0749   ALT 19 11/25/2015 0749   BILITOT 0.7 11/25/2015 0749     Renal function looked ok at last  check    Patient Active Problem List   Diagnosis Date Noted  . Paroxysmal atrial fibrillation ([place]) 09/30/2016  . Acute respiratory failure with hypoxia ([place]) 09/30/2016  . Mobitz type 2 second degree AV block 09/30/2016  . CKD (chronic kidney disease) stage 2, GFR 60-89 ml/min 09/30/2016  . Swelling of arm 05/22/2015  . Left shoulder pain 05/22/2015  . Chest pain on breathing 05/11/2015  . CKD (chronic kidney disease) stage 3, GFR 30-59 ml/min 05/11/2015  . Pacemaker 04/22/2015  . COPD exacerbation ([place]) 08/16/2013  . Generalized anxiety disorder 01/11/2012  . 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   . Tobacco abuse     Past Medical History:  Diagnosis Date  . Allergic rhinitis   . Anxiety    "occasionally"  . Asthma   . COPD (chronic obstructive pulmonary disease) ([place])   . GERD (gastroesophageal reflux disease)   . Mobitz type 2 second degree AV block 01/06/2015    Past Surgical History:  Procedure Laterality Date  . [place]  . EP IMPLANTABLE DEVICE N/A 01/07/2015   Procedure: Pacemaker Implant;  Surgeon: Evans Lance, MD;  Location: [place] [facility];  Service: Cardiovascular;  Laterality: N/A;  . PACEMAKER INSERTION    . TONSILLECTOMY AND ADENOIDECTOMY  1954    Social History  Substance Use Topics  . Smoking status: Former Smoker    Packs/day: 1.00    Years: 35.00    Types: Cigarettes    Quit date: 08/10/2012  . Smokeless tobacco: Never Used     Comment: Started at age 86  . Alcohol use 16.8 oz/week    28 Shots of liquor per week     Comment: 01/06/2015 "2 shot mixed drinks, 2/day"    Family History  Problem Relation Age of Onset  . Coronary artery disease Mother   . Heart disease Mother     CHF  . Diabetes Neg Hx   . Cancer Neg Hx   . Stroke Neg Hx   . Colon cancer Neg Hx     Allergies  Allergen Reactions  . Avelox [Moxifloxacin Hcl In Nacl] Itching and Rash    Medication list has been reviewed  and updated.  Current Outpatient Prescriptions on File Prior to Visit  Medication Sig Dispense Refill  . albuterol (PROVENTIL HFA;VENTOLIN HFA) 108 (90 Base) MCG/ACT inhaler Inhale 2 puffs into the lungs every 6 (six) hours as needed for wheezing or shortness of breath. 1 Inhaler 2  . albuterol (PROVENTIL) (2.5 MG/3ML) 0.083% nebulizer solution Take 3 mLs (2.5 mg total) by nebulization 2 (two) times daily. 1080 mL 0  . albuterol (PROVENTIL) (2.5 MG/3ML) 0.083% nebulizer solution Take  3 mLs (2.5 mg total) by nebulization every 4 (four) hours as needed for wheezing or shortness of breath. 75 mL 12  . ALPRAZolam (XANAX) 0.5 MG tablet Take 1 tablet (0.5 mg total) by mouth at bedtime as needed for sleep (help for TMJ). Do not take with alcohol 15 tablet 0  . aspirin EC 81 MG EC tablet Take 1 tablet (81 mg total) by mouth daily. 30 tablet 0  . budesonide-formoterol (SYMBICORT) 160-4.5 MCG/ACT inhaler Inhale 2 puffs into the lungs 2 (two) times daily. 3 Inhaler 1  . calcium-vitamin D (OSCAL WITH D) 250-125 MG-UNIT tablet Take 1 tablet by mouth daily.    . Cholecalciferol (VITAMIN D) 2000 UNITS CAPS Take 1 capsule by mouth daily.    Marland Kitchen guaifenesin (ROBITUSSIN) 100 MG/5ML syrup Take 200 mg by mouth 3 (three) times daily as needed for cough.    Marland Kitchen ibuprofen (ADVIL,MOTRIN) 800 MG tablet Take 1 tablet (800 mg total) by mouth every 8 (eight) hours as needed. 60 tablet 0  . Multiple Vitamin (MULTIVITAMIN) tablet Take 2 tablets by mouth every morning.     . tiotropium (SPIRIVA HANDIHALER) 18 MCG inhalation capsule Place 1 capsule (18 mcg total) into inhaler and inhale daily. 30 capsule 11   No current facility-administered medications on file prior to visit.     Review of Systems:  As per HPI- otherwise negative.   Physical Examination: Vitals:   11/24/16 0941 11/24/16 0953  BP: (!) 176/66 (!) 151/64  Pulse: 96   Temp: 97.7 F (36.5 C)    Vitals:   11/24/16 0941  Weight: 144 lb 6.4 oz (65.5 kg)   Height: 5' 5.5" (1.664 m)   Body mass index is 23.66 kg/m. Ideal Body Weight: Weight in (lb) to have BMI = 25: 152.2  GEN: WDWN, NAD, Non-toxic, A & O x 3, normal weight HEENT: Atraumatic, Normocephalic. Neck supple. No masses, No LAD.  Bilateral TM wnl, oropharynx normal.  PEERL,EOMI.   Ears and Nose: No external deformity. CV: RRR, No M/G/R. No JVD. No thrill. No extra heart sounds. PULM: CTA B, no wheezes, crackles, rhonchi. No retractions. No resp. distress. No accessory muscle use. ABD: S, NT, ND, +BS. No rebound. No HSM. EXTR: No c/c.  Trace edema of right ankle, 1+ soft edema of the left foot and ankle.  No redness or heat to suggest infection. No skin breakdown NEURO Normal gait.  PSYCH: Normally interactive. Conversant. Not depressed or anxious appearing.  Calm demeanor.   Diffuse wheezes bilaterally- better following duoneb treatment. Offered to have her seen in the ER but she prefers to wait and see Dr. Elsworth Soho today as planned  Assessment and Plan: Physical exam  Macrocytosis - Plan: CBC  Medication monitoring encounter - Plan: Comprehensive metabolic panel  Shortness of breath - Plan: ipratropium-albuterol (DUONEB) 0.5-2.5 (3) MG/3ML nebulizer solution 3 mL  Dyslipidemia - Plan: Lipid panel  Edema of foot - Plan: furosemide (LASIX) 20 MG tablet  Here today for a CPE Labs pending as above Wishes to defer her tetanus shot as she is not feeling well today Pulmonology appt later on today Gave her an rx for prn lasix to use when she is swollen Will plan further follow- up pending labs.   Signed Lamar Blinks, MD  Received her labs 4/13 and gave her a call. She admits that she is drinking more wine than she probably should be. Encouraged her to gradually cut down as it looks like she is drinking too much. We  will set her up for an Korea of her liver.  She will see me in 2 months. Her A1c was fine recently- she has been on prednisone which is likely why her glucose is  up  Results for orders placed or performed in visit on 11/24/16  CBC  Result Value Ref Range   WBC 4.3 4.0 - 10.5 K/uL   RBC 3.89 3.87 - 5.11 Mil/uL   Platelets 242.0 150.0 - 400.0 K/uL   Hemoglobin 14.2 12.0 - 15.0 g/dL   HCT 42.9 36.0 - 46.0 %   MCV 110.4 (H) 78.0 - 100.0 fl   MCHC 33.2 30.0 - 36.0 g/dL   RDW 15.1 11.5 - 15.5 %  Comprehensive metabolic panel  Result Value Ref Range   Sodium 139 135 - 145 mEq/L   Potassium 4.4 3.5 - 5.1 mEq/L   Chloride 100 96 - 112 mEq/L   CO2 27 19 - 32 mEq/L   Glucose, Bld 149 (H) 70 - 99 mg/dL   BUN 11 6 - 23 mg/dL   Creatinine, Ser 1.03 0.40 - 1.20 mg/dL   Total Bilirubin 0.8 0.2 - 1.2 mg/dL   Alkaline Phosphatase 116 39 - 117 U/L   AST 165 (H) 0 - 37 U/L   ALT 54 (H) 0 - 35 U/L   Total Protein 7.3 6.0 - 8.3 g/dL   Albumin 4.0 3.5 - 5.2 g/dL   Calcium 9.7 8.4 - 10.5 mg/dL   GFR 67.53 >60.00 mL/min  Lipid panel  Result Value Ref Range   Cholesterol 220 (H) 0 - 200 mg/dL   Triglycerides 52.0 0.0 - 149.0 mg/dL   HDL 129.40 >39.00 mg/dL   VLDL 10.4 0.0 - 40.0 mg/dL   LDL Cholesterol 81 0 - 99 mg/dL   Total CHOL/HDL Ratio 2    NonHDL 91.04    Lab Results  Component Value Date   HGBA1C 4.6 (L) 09/30/2016

## 2016-11-23 NOTE — Telephone Encounter (Signed)
Spoke with patient.  She called [employer] tech services and they ordered her a new [employer] monitor.  She states they told her it should arrive today.  She will plan to send a transmission when she gets the new monitor in the mail.  Patient reports her leg redness is much better today.  She states she is not going to go to the [facility] and is instead planning to f/u with her PCP at a previously-scheduled appointment tomorrow, 11/24/16.  Trinidad Curet, RN, aware and advised that this is the patient's choice.  Patient reports her BP this AM was 139/79.  Advised that this is not abnormal, particularly if she had been active before she checked it.  Patient verbalizes understanding and appreciation.  She denies additional questions or concerns at this time.

## 2016-11-24 ENCOUNTER — Ambulatory Visit (INDEPENDENT_AMBULATORY_CARE_PROVIDER_SITE_OTHER): Payer: PPO | Admitting: Family Medicine

## 2016-11-24 ENCOUNTER — Ambulatory Visit (INDEPENDENT_AMBULATORY_CARE_PROVIDER_SITE_OTHER): Payer: PPO | Admitting: Adult Health

## 2016-11-24 ENCOUNTER — Encounter: Payer: Self-pay | Admitting: Adult Health

## 2016-11-24 ENCOUNTER — Ambulatory Visit: Payer: PPO | Admitting: Pulmonary Disease

## 2016-11-24 VITALS — BP 151/64 | HR 96 | Temp 97.7°F | Ht 65.5 in | Wt 144.4 lb

## 2016-11-24 DIAGNOSIS — R7989 Other specified abnormal findings of blood chemistry: Secondary | ICD-10-CM

## 2016-11-24 DIAGNOSIS — R600 Localized edema: Secondary | ICD-10-CM

## 2016-11-24 DIAGNOSIS — Z5181 Encounter for therapeutic drug level monitoring: Secondary | ICD-10-CM

## 2016-11-24 DIAGNOSIS — R945 Abnormal results of liver function studies: Secondary | ICD-10-CM

## 2016-11-24 DIAGNOSIS — E785 Hyperlipidemia, unspecified: Secondary | ICD-10-CM | POA: Diagnosis not present

## 2016-11-24 DIAGNOSIS — Z0000 Encounter for general adult medical examination without abnormal findings: Secondary | ICD-10-CM | POA: Diagnosis not present

## 2016-11-24 DIAGNOSIS — D7589 Other specified diseases of blood and blood-forming organs: Secondary | ICD-10-CM

## 2016-11-24 DIAGNOSIS — I10 Essential (primary) hypertension: Secondary | ICD-10-CM | POA: Insufficient documentation

## 2016-11-24 DIAGNOSIS — R0602 Shortness of breath: Secondary | ICD-10-CM

## 2016-11-24 DIAGNOSIS — J441 Chronic obstructive pulmonary disease with (acute) exacerbation: Secondary | ICD-10-CM | POA: Diagnosis not present

## 2016-11-24 HISTORY — DX: Essential (primary) hypertension: I10

## 2016-11-24 LAB — COMPREHENSIVE METABOLIC PANEL
ALBUMIN: 4 g/dL (ref 3.5–5.2)
ALT: 54 U/L — AB (ref 0–35)
AST: 165 U/L — ABNORMAL HIGH (ref 0–37)
Alkaline Phosphatase: 116 U/L (ref 39–117)
BILIRUBIN TOTAL: 0.8 mg/dL (ref 0.2–1.2)
BUN: 11 mg/dL (ref 6–23)
CALCIUM: 9.7 mg/dL (ref 8.4–10.5)
CHLORIDE: 100 meq/L (ref 96–112)
CO2: 27 mEq/L (ref 19–32)
Creatinine, Ser: 1.03 mg/dL (ref 0.40–1.20)
GFR: 67.53 mL/min (ref >60.00)
Glucose, Bld: 149 mg/dL — ABNORMAL HIGH (ref 70–99)
Potassium: 4.4 mEq/L (ref 3.5–5.1)
Sodium: 139 mEq/L (ref 135–145)
Total Protein: 7.3 g/dL (ref 6.0–8.3)

## 2016-11-24 LAB — LIPID PANEL
CHOLESTEROL: 220 mg/dL — AB (ref 0–200)
HDL: 129.4 mg/dL (ref >39.00)
LDL Cholesterol: 81 mg/dL (ref 0–99)
NonHDL: 91.04
TRIGLYCERIDES: 52 mg/dL (ref 0.0–149.0)
Total CHOL/HDL Ratio: 2
VLDL: 10.4 mg/dL (ref 0.0–40.0)

## 2016-11-24 LAB — CBC
HCT: 42.9 % (ref 36.0–46.0)
Hemoglobin: 14.2 g/dL (ref 12.0–15.0)
MCHC: 33.2 g/dL (ref 30.0–36.0)
MCV: 110.4 fl — AB (ref 78.0–100.0)
PLATELETS: 242 10*3/uL (ref 150.0–400.0)
RBC: 3.89 Mil/uL (ref 3.87–5.11)
RDW: 15.1 % (ref 11.5–15.5)
WBC: 4.3 10*3/uL (ref 4.0–10.5)

## 2016-11-24 MED ORDER — IPRATROPIUM-ALBUTEROL 0.5-2.5 (3) MG/3ML IN SOLN
3.0000 mL | Freq: Once | RESPIRATORY_TRACT | Status: AC
  Administered 2016-11-24: 3 mL via RESPIRATORY_TRACT

## 2016-11-24 MED ORDER — PREDNISONE 10 MG PO TABS: ORAL_TABLET | ORAL | 0 refills | Status: DC

## 2016-11-24 MED ORDER — FUROSEMIDE 20 MG PO TABS: ORAL_TABLET | ORAL | 3 refills | Status: DC

## 2016-11-24 MED FILL — FUROSEMIDE 20 MG TABLET: 20 | 30 days supply | Qty: 30 | Fill #0

## 2016-11-24 MED FILL — predniSONE 10 MG TABS: 10 | 8 days supply | Qty: 20 | Fill #0

## 2016-11-24 NOTE — Patient Instructions (Addendum)
Your blood pressure is elevated, please see your Primary MD for this .  Prednisone taper over next week.   Continue on Symbicort 2 puffs Twice daily   Restart Spiriva 1 puff daily when you are finished with the  ipratropium nebs..  May use Albuterol Neb Twice daily  .  May use Proventil Inhaler 2 puffs every 4hrs or Albuterol Neb every 4hrs as needed for wheezing -this is your rescue medication.  Follow up Dr. Elsworth Soho in 2  -3 months  At [facility] office.  Please contact office for sooner follow up if symptoms do not improve or worsen or seek emergency care

## 2016-11-24 NOTE — Assessment & Plan Note (Signed)
b/p remains elevated  follow up with PCP

## 2016-11-24 NOTE — Patient Instructions (Addendum)
We will get labs for you today- I will be in touch with your results asap We can do a tetanus shot for you at your convenience when you are feeling better.   If you have any significant wound we will want to give you a tetanus right away however I do not think that your foot or ankle are infected.  However if you develop pain, redness or heat in your ankle or foot.    You can use the furosemide (lasix- fluid pill) once a day if needed for swelling

## 2016-11-24 NOTE — Assessment & Plan Note (Addendum)
Flare   Plan  Patient Instructions  Your blood pressure is elevated, please see your Primary MD for this .  Prednisone taper over next week.   Continue on Symbicort 2 puffs Twice daily   Restart Spiriva 1 puff daily when you are finished with the  ipratropium nebs..  May use Albuterol Neb Twice daily  .  May use Proventil Inhaler 2 puffs every 4hrs or Albuterol Neb every 4hrs as needed for wheezing -this is your rescue medication.  Follow up Dr. Elsworth Soho in 2  -3 months  At [facility] office.  Please contact office for sooner follow up if symptoms do not improve or worsen or seek emergency care

## 2016-11-24 NOTE — Progress Notes (Signed)
@Patient  ID: Jenna Marquez, female    DOB: November 28, 1945, 71 y.o.   MRN: [id number]  Chief Complaint  Patient presents with  . Follow-up    COPD     Referring provider: Copland, Gay Filler, MD  HPI: 71  yo female with COPD -GOLD 3 , former smoker .  Uses e cigs on/off  Has CKD stg III, A Fib and Heart block s/p pacemaker (declines anticoagulation per cards/PCP notes)   Significant tests/ events 09/2010 FeV1 49% Fef 25 75 22%  Ct chest 07/2012 - no nodules/copd changes  CTA Chest 08/2015 , neg PE, COPD changes  Spirometry 7/2017shows FEV1 46%, , ratio 35 , FVC 102%, mid flows 20%.   11/24/2016 Follow up : COPD  Pt returns for 2 month follow up .  She is followed for COPD . She was changed back to Spiriva from Atrovent nebs last ov per pt request. She has not started on Spiriva she wants to finish all her Atrovent then start on Spiriva .  Has not breathing not as good at times, if she rushes then she gets out of breath. Walking long distances wear her out. Says she can walk on tread mill okay .  Some Wheezing and cough . Dry cough . No fever.  She had a physical today with PCP . Said she was rushing and got out of breath . Got Albuterol neb today at PCP , said it helped her breathing .  Last in hospital in Feb with COPD exacerbation , tx w/ IV abx and steroids .   PVX and Prevnar are utd.  CXR in Feb w/ COPD changes .     Allergies  Allergen Reactions  . Avelox [Moxifloxacin Hcl In Nacl] Itching and Rash    Immunization History  Administered Date(s) Administered  . Influenza Split 07/14/2011, 07/24/2012, 04/29/2013  . Influenza,inj,Quad PF,36+ Mos 08/28/2014, 09/22/2015, 10/02/2016  . Pneumococcal Conjugate-13 10/30/2014  . Pneumococcal Polysaccharide-23 07/14/2011  . Zoster 03/23/2011    Past Medical History:  Diagnosis Date  . Allergic rhinitis   . Anxiety    "occasionally"  . Asthma   . COPD (chronic obstructive pulmonary disease) ([place])   . GERD (gastroesophageal  reflux disease)   . Mobitz type 2 second degree AV block 01/06/2015    Tobacco History: History  Smoking Status  . Former Smoker  . Packs/day: 1.00  . Years: 35.00  . Types: Cigarettes  . Quit date: 08/10/2012  Smokeless Tobacco  . Never Used    Comment: Started at age 48   Counseling given: Not Answered   Outpatient Encounter Prescriptions as of 11/24/2016  Medication Sig  . albuterol (PROVENTIL HFA;VENTOLIN HFA) 108 (90 Base) MCG/ACT inhaler Inhale 2 puffs into the lungs every 6 (six) hours as needed for wheezing or shortness of breath.  Marland Kitchen albuterol (PROVENTIL) (2.5 MG/3ML) 0.083% nebulizer solution Take 3 mLs (2.5 mg total) by nebulization 2 (two) times daily.  Marland Kitchen albuterol (PROVENTIL) (2.5 MG/3ML) 0.083% nebulizer solution Take 3 mLs (2.5 mg total) by nebulization every 4 (four) hours as needed for wheezing or shortness of breath.  . ALPRAZolam (XANAX) 0.5 MG tablet Take 1 tablet (0.5 mg total) by mouth at bedtime as needed for sleep (help for TMJ). Do not take with alcohol  . aspirin EC 81 MG EC tablet Take 1 tablet (81 mg total) by mouth daily.  . budesonide-formoterol (SYMBICORT) 160-4.5 MCG/ACT inhaler Inhale 2 puffs into the lungs 2 (two) times daily.  . calcium-vitamin D (OSCAL  WITH D) 250-125 MG-UNIT tablet Take 1 tablet by mouth daily.  . Cholecalciferol (VITAMIN D) 2000 UNITS CAPS Take 1 capsule by mouth daily.  . furosemide (LASIX) 20 MG tablet Use 1 daily as needed for occasional foot and ankle swelling  . guaifenesin (ROBITUSSIN) 100 MG/5ML syrup Take 200 mg by mouth 3 (three) times daily as needed for cough.  Marland Kitchen ibuprofen (ADVIL,MOTRIN) 800 MG tablet Take 1 tablet (800 mg total) by mouth every 8 (eight) hours as needed.  . Multiple Vitamin (MULTIVITAMIN) tablet Take 2 tablets by mouth every morning.   . tiotropium (SPIRIVA HANDIHALER) 18 MCG inhalation capsule Place 1 capsule (18 mcg total) into inhaler and inhale daily.  . predniSONE (DELTASONE) 10 MG tablet 4 tabs for  2 days, then 3 tabs for 2 days, 2 tabs for 2 days, then 1 tab for 2 days, then stop   No facility-administered encounter medications on file as of 11/24/2016.      Review of Systems  Constitutional:   No  weight loss, night sweats,  Fevers, chills, +fatigue, or  lassitude.  HEENT:   No headaches,  Difficulty swallowing,  Tooth/dental problems, or  Sore throat,                No sneezing, itching, ear ache,  +nasal congestion, post nasal drip,   CV:  No chest pain,  Orthopnea, PND, swelling in lower extremities, anasarca, dizziness, palpitations, syncope.   GI  No heartburn, indigestion, abdominal pain, nausea, vomiting, diarrhea, change in bowel habits, loss of appetite, bloody stools.   Resp:    No chest wall deformity  Skin: no rash or lesions.  GU: no dysuria, change in color of urine, no urgency or frequency.  No flank pain, no hematuria   MS:  No joint pain or swelling.  No decreased range of motion.  No back pain.    Physical Exam  BP (!) 171/77 (BP Location: Right Arm, Patient Position: Sitting, Cuff Size: Normal)   Ht 5' 5.5" (1.664 m)   Wt 144 lb (65.3 kg)   SpO2 94%   BMI 23.60 kg/m   GEN: A/Ox3; pleasant , NAD, elderly    HEENT:  [place]/AT,  EACs-clear, TMs-wnl, NOSE-clear, THROAT-clear, no lesions, no postnasal drip or exudate noted.   NECK:  Supple w/ fair ROM; no JVD; normal carotid impulses w/o bruits; no thyromegaly or nodules palpated; no lymphadenopathy.    RESP  Few trace wheezing ,  no accessory muscle use, no dullness to percussion, speaking in full sentences   CARD:  RRR, no m/r/g,  Tr -1 peripheral edema, pulses intact, no cyanosis or clubbing.  GI:   Soft & nt; nml bowel sounds; no organomegaly or masses detected.   Musco: Warm bil, no deformities or joint swelling noted.   Neuro: alert, no focal deficits noted.    Skin: Warm, no lesions or rashes    Lab Results:  BNP No results found for: BNP  ProBNP Imaging: US Venous Img Lower  Bilateral  Result Date: 11/21/2016 CLINICAL DATA:  Acute onset of bilateral ankle and foot swelling and erythema. Initial encounter. EXAM: BILATERAL LOWER EXTREMITY VENOUS DOPPLER ULTRASOUND TECHNIQUE: Gray-scale sonography with graded compression, as well as color Doppler and duplex ultrasound were performed to evaluate the lower extremity deep venous systems from the level of the common femoral vein and including the common femoral, femoral, profunda femoral, popliteal and calf veins including the posterior tibial, peroneal and gastrocnemius veins when visible. The superficial great saphenous  vein was also interrogated. Spectral Doppler was utilized to evaluate flow at rest and with distal augmentation maneuvers in the common femoral, femoral and popliteal veins. COMPARISON:  None. FINDINGS: RIGHT LOWER EXTREMITY Common Femoral Vein: No evidence of thrombus. Normal compressibility, respiratory phasicity and response to augmentation. Saphenofemoral Junction: No evidence of thrombus. Normal compressibility and flow on color Doppler imaging. Profunda Femoral Vein: No evidence of thrombus. Normal compressibility and flow on color Doppler imaging. Femoral Vein: No evidence of thrombus. Normal compressibility, respiratory phasicity and response to augmentation. Popliteal Vein: No evidence of thrombus. Normal compressibility, respiratory phasicity and response to augmentation. Calf Veins: No evidence of thrombus. Normal compressibility and flow on color Doppler imaging. Superficial Great Saphenous Vein: No evidence of thrombus. Normal compressibility and flow on color Doppler imaging. Venous Reflux:  None. Other Findings:  None. LEFT LOWER EXTREMITY Common Femoral Vein: No evidence of thrombus. Normal compressibility, respiratory phasicity and response to augmentation. Saphenofemoral Junction: No evidence of thrombus. Normal compressibility and flow on color Doppler imaging. Profunda Femoral Vein: No evidence of  thrombus. Normal compressibility and flow on color Doppler imaging. Femoral Vein: No evidence of thrombus. Normal compressibility, respiratory phasicity and response to augmentation. Popliteal Vein: No evidence of thrombus. Normal compressibility, respiratory phasicity and response to augmentation. Calf Veins: No evidence of thrombus. Normal compressibility and flow on color Doppler imaging. Superficial Great Saphenous Vein: No evidence of thrombus. Normal compressibility and flow on color Doppler imaging. Venous Reflux:  None. Other Findings: A patent varicosity is incidentally noted along the left popliteal fossa. IMPRESSION: No evidence of deep venous thrombosis. Electronically Signed   By: Garald Balding M.D.   On: 11/21/2016 22:14     Assessment & Plan:   COPD exacerbation ([employer]) Flare   Plan  Patient Instructions  Your blood pressure is elevated, please see your Primary MD for this .  Prednisone taper over next week.   Continue on Symbicort 2 puffs Twice daily   Restart Spiriva 1 puff daily when you are finished with the  ipratropium nebs..  May use Albuterol Neb Twice daily  .  May use Proventil Inhaler 2 puffs every 4hrs or Albuterol Neb every 4hrs as needed for wheezing -this is your rescue medication.  Follow up Dr. Elsworth Soho in 2  -3 months  At [facility] office.  Please contact office for sooner follow up if symptoms do not improve or worsen or seek emergency care            HTN (hypertension) b/p remains elevated  follow up with PCP      Rexene Edison, NP 11/24/2016

## 2016-11-25 ENCOUNTER — Encounter: Payer: Self-pay | Admitting: Family Medicine

## 2016-11-25 NOTE — Addendum Note (Signed)
Addended by: Darreld Mclean on: 11/25/2016 06:28 PM   Modules accepted: Orders

## 2016-11-25 NOTE — Progress Notes (Signed)
Reviewed & agree with plan  

## 2016-11-29 ENCOUNTER — Telehealth: Payer: Self-pay | Admitting: Cardiology

## 2016-11-29 ENCOUNTER — Ambulatory Visit (INDEPENDENT_AMBULATORY_CARE_PROVIDER_SITE_OTHER): Payer: PPO | Admitting: *Deleted

## 2016-11-29 DIAGNOSIS — I442 Atrioventricular block, complete: Secondary | ICD-10-CM | POA: Diagnosis not present

## 2016-11-29 NOTE — Telephone Encounter (Signed)
Spoke with pt and reminded pt of remote transmission that is due today. Pt verbalized understanding and stated that her home monitor has not been delivered yet. She will send remote transmission once she receives her new monitor.

## 2016-11-30 NOTE — Progress Notes (Signed)
Remote pacemaker transmission.   

## 2016-12-01 ENCOUNTER — Other Ambulatory Visit (HOSPITAL_BASED_OUTPATIENT_CLINIC_OR_DEPARTMENT_OTHER): Payer: PPO

## 2016-12-01 ENCOUNTER — Other Ambulatory Visit: Payer: Self-pay

## 2016-12-01 ENCOUNTER — Telehealth: Payer: Self-pay

## 2016-12-01 LAB — CUP PACEART REMOTE DEVICE CHECK
Battery Voltage: 2.79 V
Brady Statistic AP VS Percent: 0 %
Brady Statistic AS VP Percent: 95 %
Brady Statistic AS VS Percent: 4 %
Date Time Interrogation Session: 20180418155155
Implantable Lead Implant Date: 20160525
Implantable Lead Location: 753860
Implantable Lead Model: 5076
Implantable Pulse Generator Implant Date: 20160525
Lead Channel Impedance Value: 550 Ohm
Lead Channel Pacing Threshold Amplitude: 0.375 V
Lead Channel Pacing Threshold Pulse Width: 0.4 ms
Lead Channel Pacing Threshold Pulse Width: 0.4 ms
Lead Channel Setting Pacing Pulse Width: 0.4 ms
MDC IDC LEAD IMPLANT DT: 20160525
MDC IDC LEAD LOCATION: 753859
MDC IDC MSMT BATTERY IMPEDANCE: 158 Ohm
MDC IDC MSMT BATTERY REMAINING LONGEVITY: 117 mo
MDC IDC MSMT LEADCHNL RA IMPEDANCE VALUE: 554 Ohm
MDC IDC MSMT LEADCHNL RV PACING THRESHOLD AMPLITUDE: 0.625 V
MDC IDC SET LEADCHNL RA PACING AMPLITUDE: 1.5 V
MDC IDC SET LEADCHNL RV PACING AMPLITUDE: 2.5 V
MDC IDC SET LEADCHNL RV SENSING SENSITIVITY: 5.6 mV
MDC IDC STAT BRADY AP VP PERCENT: 1 %

## 2016-12-01 MED ORDER — METOPROLOL TARTRATE 25 MG PO TABS: 25.0000 mg | ORAL_TABLET | Freq: Two times a day (BID) | ORAL | 3 refills | Status: DC

## 2016-12-01 MED FILL — METOPROLOL TARTRATE 25 MG T: 25 | 90 days supply | Qty: 180 | Fill #0

## 2016-12-01 NOTE — Telephone Encounter (Signed)
Spoke with patient about starting Metoprolol 25mg  BID per WC recommendations r/t a hx of HVR on her remote transmissions. Instructed patient that this medication can also lower BP and to call back if she began to notice dizziness with standing or any low BP readings. Patient agreeable. Confirmed pharmacy.

## 2016-12-02 ENCOUNTER — Encounter: Payer: Self-pay | Admitting: Cardiology

## 2016-12-02 ENCOUNTER — Ambulatory Visit (HOSPITAL_BASED_OUTPATIENT_CLINIC_OR_DEPARTMENT_OTHER)
Admission: RE | Admit: 2016-12-02 | Discharge: 2016-12-02 | Disposition: A | Discharge status: Home / Self Care | Payer: PPO | Source: Ambulatory Visit | Attending: Family Medicine | Admitting: Family Medicine

## 2016-12-02 DIAGNOSIS — R7989 Other specified abnormal findings of blood chemistry: Secondary | ICD-10-CM | POA: Diagnosis not present

## 2016-12-02 DIAGNOSIS — R945 Abnormal results of liver function studies: Secondary | ICD-10-CM | POA: Diagnosis not present

## 2016-12-02 DIAGNOSIS — R932 Abnormal findings on diagnostic imaging of liver and biliary tract: Secondary | ICD-10-CM | POA: Diagnosis not present

## 2016-12-12 ENCOUNTER — Encounter (HOSPITAL_BASED_OUTPATIENT_CLINIC_OR_DEPARTMENT_OTHER): Payer: Self-pay

## 2016-12-12 ENCOUNTER — Other Ambulatory Visit: Payer: Self-pay | Admitting: Family Medicine

## 2016-12-12 ENCOUNTER — Emergency Department (HOSPITAL_BASED_OUTPATIENT_CLINIC_OR_DEPARTMENT_OTHER)
Admission: EM | Admit: 2016-12-12 | Discharge: 2016-12-12 | Disposition: A | Discharge status: Home / Self Care | Payer: PPO | Attending: Emergency Medicine | Admitting: Emergency Medicine

## 2016-12-12 ENCOUNTER — Emergency Department (HOSPITAL_BASED_OUTPATIENT_CLINIC_OR_DEPARTMENT_OTHER): Payer: PPO

## 2016-12-12 DIAGNOSIS — I129 Hypertensive chronic kidney disease with stage 1 through stage 4 chronic kidney disease, or unspecified chronic kidney disease: Secondary | ICD-10-CM | POA: Insufficient documentation

## 2016-12-12 DIAGNOSIS — B349 Viral infection, unspecified: Secondary | ICD-10-CM | POA: Diagnosis not present

## 2016-12-12 DIAGNOSIS — Z7982 Long term (current) use of aspirin: Secondary | ICD-10-CM | POA: Diagnosis not present

## 2016-12-12 DIAGNOSIS — Z950 Presence of cardiac pacemaker: Secondary | ICD-10-CM | POA: Diagnosis not present

## 2016-12-12 DIAGNOSIS — N183 Chronic kidney disease, stage 3 (moderate): Secondary | ICD-10-CM | POA: Insufficient documentation

## 2016-12-12 DIAGNOSIS — Z87891 Personal history of nicotine dependence: Secondary | ICD-10-CM | POA: Diagnosis not present

## 2016-12-12 DIAGNOSIS — J441 Chronic obstructive pulmonary disease with (acute) exacerbation: Secondary | ICD-10-CM | POA: Insufficient documentation

## 2016-12-12 DIAGNOSIS — R05 Cough: Secondary | ICD-10-CM | POA: Diagnosis not present

## 2016-12-12 DIAGNOSIS — Z79899 Other long term (current) drug therapy: Secondary | ICD-10-CM | POA: Diagnosis not present

## 2016-12-12 DIAGNOSIS — J45909 Unspecified asthma, uncomplicated: Secondary | ICD-10-CM | POA: Insufficient documentation

## 2016-12-12 MED ORDER — ALBUTEROL SULFATE (2.5 MG/3ML) 0.083% IN NEBU
2.5000 mg | INHALATION_SOLUTION | Freq: Once | RESPIRATORY_TRACT | Status: AC
  Administered 2016-12-12: 2.5 mg via RESPIRATORY_TRACT
  Filled 2016-12-12: qty 3

## 2016-12-12 MED ORDER — PREDNISONE 20 MG PO TABS: ORAL_TABLET | ORAL | 0 refills | Status: DC

## 2016-12-12 MED ORDER — GUAIFENESIN 100 MG/5ML PO SYRP: 200.0000 mg | ORAL_SOLUTION | Freq: Three times a day (TID) | ORAL | 1 refills | Status: DC | PRN

## 2016-12-12 MED ORDER — IBUPROFEN 800 MG PO TABS: 800.0000 mg | ORAL_TABLET | Freq: Three times a day (TID) | ORAL | 0 refills | Status: DC

## 2016-12-12 MED ORDER — PREDNISONE 50 MG PO TABS
60.0000 mg | ORAL_TABLET | Freq: Once | ORAL | Status: AC
  Administered 2016-12-12: 60 mg via ORAL
  Filled 2016-12-12: qty 1

## 2016-12-12 MED ORDER — DOXYCYCLINE HYCLATE 100 MG PO CAPS: 100.0000 mg | ORAL_CAPSULE | Freq: Two times a day (BID) | ORAL | 0 refills | Status: DC

## 2016-12-12 MED ORDER — BENZONATATE 100 MG PO CAPS: 100.0000 mg | ORAL_CAPSULE | Freq: Three times a day (TID) | ORAL | 0 refills | Status: DC

## 2016-12-12 MED ORDER — BENZONATATE 100 MG PO CAPS
200.0000 mg | ORAL_CAPSULE | Freq: Once | ORAL | Status: AC
  Administered 2016-12-12: 200 mg via ORAL
  Filled 2016-12-12: qty 2

## 2016-12-12 MED ORDER — IBUPROFEN 800 MG PO TABS
800.0000 mg | ORAL_TABLET | Freq: Once | ORAL | Status: DC
  Filled 2016-12-12: qty 1

## 2016-12-12 MED ORDER — IPRATROPIUM-ALBUTEROL 0.5-2.5 (3) MG/3ML IN SOLN
3.0000 mL | Freq: Once | RESPIRATORY_TRACT | Status: AC
  Administered 2016-12-12: 3 mL via RESPIRATORY_TRACT
  Filled 2016-12-12: qty 3

## 2016-12-12 NOTE — Telephone Encounter (Signed)
Caller name: Danniell Rotundo Relationship to patient: self Can be reached: [phone] Pharmacy: [id number] - [place], [place] - [address] AT [address]  Reason for call: Pt requesting refill on Guaifenesin. Pt stating previously filled by Dr. Birdie Riddle. She has copd/asthma. She has symptoms x1 week of cough, thick mucus, laryngitis, sore throat. She had some from before that she has used up. Tried to request thru mychart but would not allow.

## 2016-12-12 NOTE — Telephone Encounter (Signed)
Called patient back left message on answering machine that the medication she is requesting would have to be given after an office visit per [name]. Advised patient to call back for appointment.

## 2016-12-12 NOTE — Telephone Encounter (Signed)
Patient states the wrong cough med was called in for her. She needs the one with Codeine

## 2016-12-12 NOTE — ED Triage Notes (Signed)
c/o flu like sx x 4 days-DOE-RT in for assessment

## 2016-12-12 NOTE — ED Provider Notes (Signed)
[facility] Provider Note   CSN: [id number] Arrival date & time: 12/12/16  2227   By signing my name below, I, Delton Prairie, attest that this documentation has been prepared under the direction and in the presence of Durelle Zepeda, MD  Electronically Signed: Delton Prairie, ED Scribe. 12/12/16. 11:12 PM.   History   Chief Complaint Chief Complaint  Patient presents with  . Cough    HPI Comments:  Jenna Marquez is a 71 y.o. female who presents to the Emergency Department complaining of acute onset, persistent, intermittent cough x 4 days. Pt also reports a moderate sore throat. No alleviating or aggravating factors noted. Pt denies any other associated symptoms. No other complaints noted at this time.   The history is provided by the patient. No language interpreter was used.  Cough  This is a new problem. The current episode started more than 2 days ago. The problem occurs every few minutes. The problem has not changed since onset.The cough is non-productive. There has been no fever. Associated symptoms include sore throat and wheezing. Pertinent negatives include no chest pain and no shortness of breath. She has tried nothing for the symptoms. The treatment provided no relief. Her past medical history is significant for COPD.    Past Medical History:  Diagnosis Date  . Allergic rhinitis   . Anxiety    "occasionally"  . Asthma   . COPD (chronic obstructive pulmonary disease) ([place])   . GERD (gastroesophageal reflux disease)   . 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 HTN (hypertension) 11/24/2016  . Paroxysmal atrial fibrillation ([place]) 09/30/2016  . Acute respiratory failure with hypoxia ([place]) 09/30/2016  . Mobitz type 2 second degree AV block 09/30/2016  . CKD (chronic kidney disease) stage 2, GFR 60-89 ml/min 09/30/2016  . Swelling of arm 05/22/2015  . Left shoulder pain 05/22/2015  . Chest pain  on breathing 05/11/2015  . CKD (chronic kidney disease) stage 3, GFR 30-59 ml/min 05/11/2015  . Pacemaker 04/22/2015  . COPD exacerbation ([place]) 08/16/2013  . Generalized anxiety disorder 01/11/2012  . 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   . Tobacco abuse     Past Surgical History:  Procedure Laterality Date  . [place]  . EP IMPLANTABLE DEVICE N/A 01/07/2015   Procedure: Pacemaker Implant;  Surgeon: Evans Lance, MD;  Location: [facility];  Service: Cardiovascular;  Laterality: N/A;  . PACEMAKER INSERTION    . TONSILLECTOMY AND ADENOIDECTOMY  1954    OB History    No data available       Home Medications    Prior to Admission medications   Medication Sig Start Date End Date Taking? Authorizing Provider  albuterol (PROVENTIL HFA;VENTOLIN HFA) 108 (90 Base) MCG/ACT inhaler Inhale 2 puffs into the lungs every 6 (six) hours as needed for wheezing or shortness of breath. 10/02/16   Nita Sells, MD  albuterol (PROVENTIL) (2.5 MG/3ML) 0.083% nebulizer solution Take 3 mLs (2.5 mg total) by nebulization 2 (two) times daily. 10/06/16   Tammy S Parrett, NP  albuterol (PROVENTIL) (2.5 MG/3ML) 0.083% nebulizer solution Take 3 mLs (2.5 mg total) by nebulization every 4 (four) hours as needed for wheezing or shortness of breath. 10/06/16   Tammy S Parrett, NP  ALPRAZolam (XANAX) 0.5 MG tablet Take 1 tablet (0.5 mg total) by mouth at bedtime as needed for sleep (help for  TMJ). Do not take with alcohol 04/29/16   Shelda Pal, DO  aspirin EC 81 MG EC tablet Take 1 tablet (81 mg total) by mouth daily. 10/02/16   Nita Sells, MD  budesonide-formoterol (SYMBICORT) 160-4.5 MCG/ACT inhaler Inhale 2 puffs into the lungs 2 (two) times daily. 10/02/16   Nita Sells, MD  calcium-vitamin D (OSCAL WITH D) 250-125 MG-UNIT tablet Take 1 tablet by mouth daily.    Historical Provider, MD  Cholecalciferol (VITAMIN D) 2000 UNITS CAPS  Take 1 capsule by mouth daily.    Historical Provider, MD  furosemide (LASIX) 20 MG tablet Use 1 daily as needed for occasional foot and ankle swelling 11/24/16   Gay Filler Copland, MD  guaifenesin (ROBITUSSIN) 100 MG/5ML syrup Take 10 mLs (200 mg total) by mouth 3 (three) times daily as needed for cough. 12/12/16   Gay Filler Copland, MD  ibuprofen (ADVIL,MOTRIN) 800 MG tablet Take 1 tablet (800 mg total) by mouth every 8 (eight) hours as needed. 10/11/16   Darreld Mclean, MD  metoprolol tartrate (LOPRESSOR) 25 MG tablet Take 1 tablet (25 mg total) by mouth 2 (two) times daily. 12/01/16 03/01/17  Will Meredith Leeds, MD  Multiple Vitamin (MULTIVITAMIN) tablet Take 2 tablets by mouth every morning.     Historical Provider, MD  predniSONE (DELTASONE) 10 MG tablet 4 tabs for 2 days, then 3 tabs for 2 days, 2 tabs for 2 days, then 1 tab for 2 days, then stop 11/24/16   Tammy S Parrett, NP  tiotropium (SPIRIVA HANDIHALER) 18 MCG inhalation capsule Place 1 capsule (18 mcg total) into inhaler and inhale daily. 10/06/16 10/06/17  Melvenia Needles, NP    Family History Family History  Problem Relation Age of Onset  . Coronary artery disease Mother   . Heart disease Mother     CHF  . Diabetes Neg Hx   . Cancer Neg Hx   . Stroke Neg Hx   . Colon cancer Neg Hx     Social History Social History  Substance Use Topics  . Smoking status: Former Smoker    Packs/day: 1.00    Years: 35.00    Types: Cigarettes    Quit date: 08/10/2012  . Smokeless tobacco: Never Used     Comment: Started at age 70  . Alcohol use 16.8 oz/week    28 Shots of liquor per week     Comment: 01/06/2015 "2 shot mixed drinks, 2/day"     Allergies   Avelox [moxifloxacin hcl in nacl]   Review of Systems Review of Systems  Constitutional: Negative for diaphoresis and fever.  HENT: Positive for sore throat. Negative for tinnitus, trouble swallowing and voice change.   Respiratory: Positive for cough and wheezing. Negative for  shortness of breath.   Cardiovascular: Negative for chest pain, palpitations and leg swelling.  Gastrointestinal: Negative for abdominal pain and vomiting.  All other systems reviewed and are negative.  Physical Exam Updated Vital Signs BP 115/70 (BP Location: Left Arm)   Pulse (!) 110   Temp 98.3 F (36.8 C) (Oral)   Resp (!) 22   SpO2 94%   Physical Exam  Constitutional: She is oriented to person, place, and time. She appears well-developed and well-nourished. No distress.  HENT:  Head: Normocephalic and atraumatic.  Mouth/Throat: Oropharynx is clear and moist. No oropharyngeal exudate.  Moist mucous membranes.   Eyes: Conjunctivae are normal. Pupils are equal, round, and reactive to light.  Neck: Normal range of motion. Neck supple.  No JVD present.  Trachea midline No bruit  Cardiovascular: Normal rate, regular rhythm and normal heart sounds.   Pulmonary/Chest: Effort normal and breath sounds normal. No stridor. No respiratory distress. She has no wheezes. She has no rales.  Abdominal: Soft. Bowel sounds are normal. She exhibits no distension. There is no rebound and no guarding.  Musculoskeletal: Normal range of motion. She exhibits no edema or tenderness.  Lymphadenopathy:    She has no cervical adenopathy.  Neurological: She is alert and oriented to person, place, and time. She has normal reflexes. She displays normal reflexes.  Skin: Skin is warm and dry. Capillary refill takes less than 2 seconds.  Psychiatric: She has a normal mood and affect. Her behavior is normal.  Nursing note and vitals reviewed.  ED Treatments / Results   Vitals:   12/12/16 2236  BP: 115/70  Pulse: (!) 110  Resp: (!) 22  Temp: 98.3 F (36.8 C)     DIAGNOSTIC STUDIES:  Oxygen Saturation is 94% on RA, adequate by my interpretation.    COORDINATION OF CARE:  11:11 PM Discussed treatment plan with pt at bedside and pt agreed to plan.  Radiology  Results for orders placed or  performed in visit on 11/29/16  CUP PACEART REMOTE DEVICE CHECK  Result Value Ref Range   Date Time Interrogation Session [id number]    Pulse Generator Manufacturer MERM    Pulse Gen Model [id number] Adapta    Pulse Gen Serial Number [id number] [facility] Name [facility]    Implantable Pulse Generator Type Implantable Pulse Generator    Implantable Pulse Generator Implant Date 58099833    Implantable Lead Manufacturer MERM    Implantable Lead Model 5076 CapSureFix Novus    Implantable Lead Serial Number [id number]    Implantable Lead Implant Date [id number]    Implantable Lead Location Detail 1 APPENDAGE    Implantable Lead Location [id number]    Implantable Lead Manufacturer MERM    Implantable Lead Model 5076 CapSureFix Novus    Implantable Lead Serial Number [id number]    Implantable Lead Implant Date [id number]    Implantable Lead Location Detail 1 APEX    Implantable Lead Location [id number]    Lead Channel Setting Sensing Sensitivity 5.60 mV   Lead Channel Setting Pacing Amplitude 1.500 V   Lead Channel Setting Pacing Pulse Width 0.40 ms   Lead Channel Setting Pacing Amplitude 2.500 V   Lead Channel Impedance Value 554 ohm   Lead Channel Pacing Threshold Amplitude 0.375 V   Lead Channel Pacing Threshold Pulse Width 0.40 ms   Lead Channel Impedance Value 550 ohm   Lead Channel Pacing Threshold Amplitude 0.625 V   Lead Channel Pacing Threshold Pulse Width 0.40 ms   Battery Status OK    Battery Remaining Longevity 117 mo   Battery Voltage 2.79 V   Battery Impedance 158 ohm   Brady Statistic AP VP Percent 1 %   Brady Statistic AS VP Percent 95 %   Brady Statistic AP VS Percent 0 %   Brady Statistic AS VS Percent 4 %   Eval Rhythm As/Vs    Dg Chest 2 View  Result Date: 12/12/2016 CLINICAL DATA:  Productive cough with sore throat EXAM: CHEST  2 VIEW COMPARISON:  09/30/2016 FINDINGS: Left-sided duo lead pacing device as before. Mild hyperinflation. Linear atelectasis or scarring  at the lung bases. No acute consolidation or pleural effusion. Cardiomediastinal silhouette within normal limits with atherosclerosis. No pneumothorax. IMPRESSION: Linear scarring or  atelectasis at the lung bases. No acute infiltrate or edema. Electronically Signed   By: Donavan Foil M.D.   On: 12/12/2016 23:13   US Venous Img Lower Bilateral  Result Date: 11/21/2016 CLINICAL DATA:  Acute onset of bilateral ankle and foot swelling and erythema. Initial encounter. EXAM: BILATERAL LOWER EXTREMITY VENOUS DOPPLER ULTRASOUND TECHNIQUE: Gray-scale sonography with graded compression, as well as color Doppler and duplex ultrasound were performed to evaluate the lower extremity deep venous systems from the level of the common femoral vein and including the common femoral, femoral, profunda femoral, popliteal and calf veins including the posterior tibial, peroneal and gastrocnemius veins when visible. The superficial great saphenous vein was also interrogated. Spectral Doppler was utilized to evaluate flow at rest and with distal augmentation maneuvers in the common femoral, femoral and popliteal veins. COMPARISON:  None. FINDINGS: RIGHT LOWER EXTREMITY Common Femoral Vein: No evidence of thrombus. Normal compressibility, respiratory phasicity and response to augmentation. Saphenofemoral Junction: No evidence of thrombus. Normal compressibility and flow on color Doppler imaging. Profunda Femoral Vein: No evidence of thrombus. Normal compressibility and flow on color Doppler imaging. Femoral Vein: No evidence of thrombus. Normal compressibility, respiratory phasicity and response to augmentation. Popliteal Vein: No evidence of thrombus. Normal compressibility, respiratory phasicity and response to augmentation. Calf Veins: No evidence of thrombus. Normal compressibility and flow on color Doppler imaging. Superficial Great Saphenous Vein: No evidence of thrombus. Normal compressibility and flow on color Doppler imaging.  Venous Reflux:  None. Other Findings:  None. LEFT LOWER EXTREMITY Common Femoral Vein: No evidence of thrombus. Normal compressibility, respiratory phasicity and response to augmentation. Saphenofemoral Junction: No evidence of thrombus. Normal compressibility and flow on color Doppler imaging. Profunda Femoral Vein: No evidence of thrombus. Normal compressibility and flow on color Doppler imaging. Femoral Vein: No evidence of thrombus. Normal compressibility, respiratory phasicity and response to augmentation. Popliteal Vein: No evidence of thrombus. Normal compressibility, respiratory phasicity and response to augmentation. Calf Veins: No evidence of thrombus. Normal compressibility and flow on color Doppler imaging. Superficial Great Saphenous Vein: No evidence of thrombus. Normal compressibility and flow on color Doppler imaging. Venous Reflux:  None. Other Findings: A patent varicosity is incidentally noted along the left popliteal fossa. IMPRESSION: No evidence of deep venous thrombosis. Electronically Signed   By: Garald Balding M.D.   On: 11/21/2016 22:14   US Abdomen Limited Ruq  Result Date: 12/02/2016 CLINICAL DATA:  Elevated LFTs. EXAM: US ABDOMEN LIMITED - RIGHT UPPER QUADRANT COMPARISON:  CT 05/22/2012. FINDINGS: Gallbladder: No gallstones or wall thickening visualized. No sonographic Murphy sign noted by sonographer. Common bile duct: Diameter: 2 mm Liver: Increased echogenicity consistent with fatty infiltration and/or hepatocellular disease. No focal hepatic abnormality . IMPRESSION: 1. No gallstones or biliary distention. 2. Increased hepatic echogenicity consistent fatty infiltration and/or hepatocellular disease. Electronically Signed   By: Marcello Moores  Register   On: 12/02/2016 09:12    Procedures Procedures (including critical care time)  Medications Ordered in ED  Medications  ibuprofen (ADVIL,MOTRIN) tablet 800 mg (800 mg Oral Not Given 12/12/16 2319)  ipratropium-albuterol (DUONEB)  0.5-2.5 (3) MG/3ML nebulizer solution 3 mL (3 mLs Nebulization Given 12/12/16 2243)  albuterol (PROVENTIL) (2.5 MG/3ML) 0.083% nebulizer solution 2.5 mg (2.5 mg Nebulization Given 12/12/16 2243)  predniSONE (DELTASONE) tablet 60 mg (60 mg Oral Given 12/12/16 2319)  benzonatate (TESSALON) capsule 200 mg (200 mg Oral Given 12/12/16 2319)    CENTOR criteria negative one no indication for further testing or treatment Final Clinical Impressions(s) / ED  Diagnoses   COPD exacerbation superimposed on viral illness:  I have reviewed the triage vital signs and the nursing notes. Pertinent labs &imaging results that were available during my care of the patient were reviewed by me and considered in my medical decision making (see chart for details). The patient is nontoxic-appearing on exam and vital signs are within normal limits. Return for leg swelling, chest pain, shortness of breath cough, hemoptysis, wheezing, fevers or any concerns.   After history, exam, and medical workup I feel the patient has been appropriately medically screened and is safe for discharge home. Pertinent diagnoses were discussed with the patient. Patient was given return precautions.  I personally performed the services described in this documentation, which was scribed in my presence. The recorded information has been reviewed and is accurate.      Veatrice Kells, MD 12/13/16 0010

## 2016-12-12 NOTE — Telephone Encounter (Signed)
Called her back- refilled her guaifenesin syrup which she feels is only available as an rx. States that she has been sick for 5 days but has been too busy to be seen.  Encouraged her to be seen by our office or UC as soon as possible

## 2016-12-12 NOTE — Telephone Encounter (Signed)
Patient is calling again, is very upset that she has not received a call yet. Did explain to her that Dr [name] is seeing patients.

## 2016-12-13 ENCOUNTER — Encounter: Payer: Self-pay | Admitting: Family Medicine

## 2016-12-13 ENCOUNTER — Encounter (HOSPITAL_BASED_OUTPATIENT_CLINIC_OR_DEPARTMENT_OTHER): Payer: Self-pay | Admitting: Emergency Medicine

## 2016-12-13 NOTE — Progress Notes (Unsigned)
Follow up call made to patient regarding ED visit. States she was seen las pm in ED, given 4 medications and states feels much better. Advised to call office if symptoms returned states she will be trying to re-establish with former Dr. Birdie Riddle.

## 2016-12-14 ENCOUNTER — Encounter: Payer: Self-pay | Admitting: Family Medicine

## 2017-01-16 ENCOUNTER — Telehealth: Payer: Self-pay | Admitting: Family Medicine

## 2017-01-16 NOTE — Telephone Encounter (Signed)
Caller name: Akshita Italiano Relationship to patient: self Can be reached: [phone]  Reason for call: Pt called in stating that she is still having edema in left foot. Advised pt last visit was 11/24/16 and would likely need OV. Pt stated she did not want another OV and copay. Pt said that Dr. Curt Bears gave her Metoprolol but it didn't seem to do anything for swelling. She had no energy on it so she stopped taking it. She said she has doxycycline on hand and wants to know if she should take it for the swelling. Has it from 12/13/16 for something else.

## 2017-01-16 NOTE — Telephone Encounter (Signed)
Patient calling again, very upset she has not been called back. Patient did make appointment tomorrow with [name]. Request to speak to nurse or Dr [name]

## 2017-01-17 ENCOUNTER — Ambulatory Visit (INDEPENDENT_AMBULATORY_CARE_PROVIDER_SITE_OTHER): Payer: PPO | Admitting: Medical

## 2017-01-17 ENCOUNTER — Encounter: Payer: Self-pay | Admitting: Medical

## 2017-01-17 VITALS — BP 121/65 | HR 108 | Temp 98.3°F | Resp 16 | Ht 65.0 in | Wt 145.4 lb

## 2017-01-17 DIAGNOSIS — L089 Local infection of the skin and subcutaneous tissue, unspecified: Secondary | ICD-10-CM

## 2017-01-17 MED ORDER — DOXYCYCLINE HYCLATE 100 MG PO TABS: 100.0000 mg | ORAL_TABLET | Freq: Two times a day (BID) | ORAL | 0 refills | Status: DC

## 2017-01-17 MED FILL — DOXYCYCLINE HYCLATE 100 MG: 100 | 10 days supply | Qty: 20 | Fill #0

## 2017-01-17 NOTE — Progress Notes (Signed)
Subjective:    Patient ID: Jenna Marquez, female    DOB: [birth year], 71 y.o.   MRN: [id number]  HPI  Pt in reporting that he left foot swelling with some faint warmth and pain. Pt notes 1small scabs on top of left mid foot. Off and on swelling for 3 weeks.   Pt is on lasix and area not improving.  She has rx of doxycyline antibiotic in early May. She took for about 3 days but then forgot. Pt pulmonologist rx'd.    Review of Systems  Constitutional: Negative for chills and fatigue.  Respiratory: Negative for cough, chest tightness, shortness of breath and wheezing.   Cardiovascular: Negative for chest pain and palpitations.  Gastrointestinal: Negative for abdominal pain.  Musculoskeletal:       Left lower ext pain and redness.   Neurological: Negative for dizziness and headaches.  Hematological: Negative for adenopathy. Does not bruise/bleed easily.   Past Medical History:  Diagnosis Date  . Allergic rhinitis   . Anxiety    "occasionally"  . Asthma   . COPD (chronic obstructive pulmonary disease) ([place])   . GERD (gastroesophageal reflux disease)   . Mobitz type 2 second degree AV block 01/06/2015     Social History   Social History  . Marital status: Widowed    Spouse name: N/A  . Number of children: 1  . Years of education: N/A   Occupational History  . 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 Smoking status: Former Smoker    Packs/day: 1.00    Years: 35.00    Types: Cigarettes    Quit date: 08/10/2012  . Smokeless tobacco: Never Used     Comment: Started at age 62  . Alcohol use 16.8 oz/week    28 Shots of liquor per week     Comment: 01/06/2015 "2 shot mixed drinks, 2/day"  . Drug use: No  . Sexual activity: No   Other Topics Concern  . Not on file   Social History Narrative   Lost husband in 12-2011, she lost her sister in 2012, brother in July 2013. She lives by herself, God son stayed with her. She retired as Counsellor.    Past Surgical History:  Procedure Laterality Date  . [place]  . EP IMPLANTABLE DEVICE N/A 01/07/2015   Procedure: Pacemaker Implant;  Surgeon: Evans Lance, MD;  Location: [facility];  Service: Cardiovascular;  Laterality: N/A;  . PACEMAKER INSERTION    . TONSILLECTOMY AND ADENOIDECTOMY  1954    Family History  Problem Relation Age of Onset  . Coronary artery disease Mother   . Heart disease Mother        CHF  . Diabetes Neg Hx   . Cancer Neg Hx   . Stroke Neg Hx   . Colon cancer Neg Hx     Allergies  Allergen Reactions  . Avelox [Moxifloxacin Hcl In Nacl] Itching and Rash    Current Outpatient Prescriptions on File Prior to Visit  Medication Sig Dispense Refill  . albuterol (PROVENTIL HFA;VENTOLIN HFA) 108 (90 Base) MCG/ACT inhaler Inhale 2 puffs into the lungs every 6 (six) hours as needed for wheezing or shortness of breath. 1 Inhaler 2  . albuterol (PROVENTIL) (2.5 MG/3ML) 0.083% nebulizer solution Take 3 mLs (2.5 mg total) by nebulization 2 (two) times daily. 1080 mL 0  . albuterol (PROVENTIL) (2.5 MG/3ML) 0.083% nebulizer solution Take  3 mLs (2.5 mg total) by nebulization every 4 (four) hours as needed for wheezing or shortness of breath. 75 mL 12  . ALPRAZolam (XANAX) 0.5 MG tablet Take 1 tablet (0.5 mg total) by mouth at bedtime as needed for sleep (help for TMJ). Do not take with alcohol 15 tablet 0  . aspirin EC 81 MG EC tablet Take 1 tablet (81 mg total) by mouth daily. 30 tablet 0  . benzonatate (TESSALON) 100 MG capsule Take 1 capsule (100 mg total) by mouth every 8 (eight) hours. 21 capsule 0  . budesonide-formoterol (SYMBICORT) 160-4.5 MCG/ACT inhaler Inhale 2 puffs into the lungs 2 (two) times daily. 3 Inhaler 1  . calcium-vitamin D (OSCAL WITH D) 250-125 MG-UNIT tablet Take 1 tablet by mouth daily.    . Cholecalciferol (VITAMIN D) 2000 UNITS CAPS Take 1 capsule by mouth daily.    Marland Kitchen doxycycline (VIBRAMYCIN) 100 MG  capsule Take 1 capsule (100 mg total) by mouth 2 (two) times daily. One po bid x 7 days 14 capsule 0  . furosemide (LASIX) 20 MG tablet Use 1 daily as needed for occasional foot and ankle swelling 30 tablet 3  . guaifenesin (ROBITUSSIN) 100 MG/5ML syrup Take 10 mLs (200 mg total) by mouth 3 (three) times daily as needed for cough. 120 mL 1  . ibuprofen (ADVIL,MOTRIN) 800 MG tablet Take 1 tablet (800 mg total) by mouth 3 (three) times daily. 21 tablet 0  . metoprolol tartrate (LOPRESSOR) 25 MG tablet Take 1 tablet (25 mg total) by mouth 2 (two) times daily. 180 tablet 3  . Multiple Vitamin (MULTIVITAMIN) tablet Take 2 tablets by mouth every morning.     . predniSONE (DELTASONE) 20 MG tablet 3 tabs po day one, then 2 po daily x 4 days 11 tablet 0  . tiotropium (SPIRIVA HANDIHALER) 18 MCG inhalation capsule Place 1 capsule (18 mcg total) into inhaler and inhale daily. 30 capsule 11   No current facility-administered medications on file prior to visit.     BP 121/65 (BP Location: Right Arm, Patient Position: Sitting, Cuff Size: Normal)   Pulse (!) 108   Temp 98.3 F (36.8 C) (Oral)   Resp 16   Ht 5\' 5"  (1.651 m)   Wt 145 lb 6.4 oz (66 kg)   SpO2 93%   BMI 24.20 kg/m       Objective:   Physical Exam  General- No acute distress. Pleasant patient. Lungs- even and unlabored but faint rough breath sounds upper lobes Heart- regular rate and rhythm. Neurologic- CNII- XII grossly intact.  Left lower ext- foot appears red from toes to mid tibia. Mild redness, some warmth and some tenderness. Faint redness up mid tibia. No palpable lymph nodes on exam.   Neg homans signs.      Assessment & Plan:  Your foot does look infected. I do want you to start doxycycline antibiotic today. We need to check your foot in 2-3 days.  If you notice redness or swelling(tracking up you leg then be seen in ED).  If any pain occurs behind knee would recommend lower ext [place].  Follow up in 2 days or as  needed  Pictures taken today by patient with her tablet. She can show Dr. Wendling.(looked red on exam. On picture slight redness did not show?)

## 2017-01-17 NOTE — Patient Instructions (Addendum)
Your foot does look infected. I do want you to start doxycycline antibiotic today. We need to check your foot in 2-3 days.  If you notice redness or swelling(tracking up you leg then be seen in ED).  If any pain occurs behind knee would recommend lower ext [place].  Follow up in 2 days or as needed

## 2017-01-18 NOTE — Telephone Encounter (Signed)
Pt seen in the office by [name] on 01/17/17.

## 2017-01-20 ENCOUNTER — Other Ambulatory Visit: Payer: Self-pay | Admitting: Family Medicine

## 2017-01-20 ENCOUNTER — Ambulatory Visit: Payer: PPO | Admitting: Family Medicine

## 2017-01-20 ENCOUNTER — Other Ambulatory Visit: Payer: Self-pay | Admitting: Emergency Medicine

## 2017-01-20 MED ORDER — ALPRAZOLAM 0.5 MG PO TABS: 0.5000 mg | ORAL_TABLET | Freq: Every evening | ORAL | 0 refills | Status: DC | PRN

## 2017-01-20 NOTE — Telephone Encounter (Signed)
N[facility]: no entries in the last 6 months.  Ok to refill

## 2017-01-20 NOTE — Telephone Encounter (Signed)
Caller name: Lunabelle  Relation to pt: self  Call back number: [phone] Pharmacy: [id number] - [place], [place] - [address] AT [place]  Reason for call: Pt is requesting refill on ALPRAZolam (XANAX) 0.5 MG tablet. Pt stated only has two pills left only. Needing to be refill ASAP.

## 2017-01-20 NOTE — Telephone Encounter (Signed)
Requesting: ALPRAZolam (XANAX) 0.5 MG tablet Contract UDS Last OV:  Last Refill:   Please Advise

## 2017-01-20 NOTE — Telephone Encounter (Signed)
Pt stated needing ASAP.

## 2017-01-22 NOTE — Progress Notes (Signed)
[place] at [facility] [address], [place], [place] [address] [phone]  Date:  01/23/2017   Name:  Jenna Marquez   DOB:  [birth year]   MRN:  [id number]  PCP:  Darreld Mclean, MD    Chief Complaint: Follow-up (Pt here for 3 day f/u visit for skin infection left foot. )   History of Present Illness:  [name] is a 71 y.o. very pleasant female patient who presents with the following:  Seen by Percell Miller with left foot cellulitis on 6/5- started on doxycycline.  Here today for a follow-up visit History of HTN, CKD, COPD, anxiety  Her foot feels better- it itches some, but does not hurt No fever or chills  She does take her lasix once a day She is living alone- admits that she tends to feel nervous especially at night, which is why she sometimes drinks too much.     She does not want to have a pet such as a dog- "they are nasty" Patient Active Problem List   Diagnosis Date Noted  . HTN (hypertension) 11/24/2016  . Paroxysmal atrial fibrillation ([place]) 09/30/2016  . Acute respiratory failure with hypoxia ([place]) 09/30/2016  . Mobitz type 2 second degree AV block 09/30/2016  . CKD (chronic kidney disease) stage 2, GFR 60-89 ml/min 09/30/2016  . Swelling of arm 05/22/2015  . Left shoulder pain 05/22/2015  . Chest pain on breathing 05/11/2015  . CKD (chronic kidney disease) stage 3, GFR 30-59 ml/min 05/11/2015  . Pacemaker 04/22/2015  . Generalized anxiety disorder 01/11/2012  . 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   . Tobacco abuse     Past Medical History:  Diagnosis Date  . Allergic rhinitis   . Anxiety    "occasionally"  . Asthma   . COPD (chronic obstructive pulmonary disease) ([place])   . GERD (gastroesophageal reflux disease)   . Mobitz type 2 second degree AV block 01/06/2015    Past Surgical History:  Procedure Laterality Date  . [place]  . EP IMPLANTABLE  DEVICE N/A 01/07/2015   Procedure: Pacemaker Implant;  Surgeon: Evans Lance, MD;  Location: [facility];  Service: Cardiovascular;  Laterality: N/A;  . PACEMAKER INSERTION    . TONSILLECTOMY AND ADENOIDECTOMY  1954    Social History  Substance Use Topics  . Smoking status: Former Smoker    Packs/day: 1.00    Years: 35.00    Types: Cigarettes    Quit date: 08/10/2012  . Smokeless tobacco: Never Used     Comment: Started at age 12  . Alcohol use 16.8 oz/week    28 Shots of liquor per week     Comment: 01/06/2015 "2 shot mixed drinks, 2/day"    Family History  Problem Relation Age of Onset  . Coronary artery disease Mother   . Heart disease Mother        CHF  . Diabetes Neg Hx   . Cancer Neg Hx   . Stroke Neg Hx   . Colon cancer Neg Hx     Allergies  Allergen Reactions  . Avelox [Moxifloxacin Hcl In Nacl] Itching and Rash    Medication list has been reviewed and updated.  Current Outpatient Prescriptions on File Prior to Visit  Medication Sig Dispense Refill  . albuterol (PROVENTIL HFA;VENTOLIN HFA) 108 (90 Base) MCG/ACT inhaler Inhale 2 puffs into the lungs every 6 (six) hours as needed  for wheezing or shortness of breath. 1 Inhaler 2  . albuterol (PROVENTIL) (2.5 MG/3ML) 0.083% nebulizer solution Take 3 mLs (2.5 mg total) by nebulization 2 (two) times daily. 1080 mL 0  . albuterol (PROVENTIL) (2.5 MG/3ML) 0.083% nebulizer solution Take 3 mLs (2.5 mg total) by nebulization every 4 (four) hours as needed for wheezing or shortness of breath. 75 mL 12  . ALPRAZolam (XANAX) 0.5 MG tablet Take 1 tablet (0.5 mg total) by mouth at bedtime as needed for sleep. Do not take with alcohol 30 tablet 0  . aspirin EC 81 MG EC tablet Take 1 tablet (81 mg total) by mouth daily. 30 tablet 0  . benzonatate (TESSALON) 100 MG capsule Take 1 capsule (100 mg total) by mouth every 8 (eight) hours. 21 capsule 0  . budesonide-formoterol (SYMBICORT) 160-4.5 MCG/ACT inhaler Inhale 2 puffs  into the lungs 2 (two) times daily. 3 Inhaler 1  . calcium-vitamin D (OSCAL WITH D) 250-125 MG-UNIT tablet Take 1 tablet by mouth daily.    . Cholecalciferol (VITAMIN D) 2000 UNITS CAPS Take 1 capsule by mouth daily.    Marland Kitchen doxycycline (VIBRA-TABS) 100 MG tablet Take 1 tablet (100 mg total) by mouth 2 (two) times daily. 20 tablet 0  . doxycycline (VIBRAMYCIN) 100 MG capsule Take 1 capsule (100 mg total) by mouth 2 (two) times daily. One po bid x 7 days 14 capsule 0  . furosemide (LASIX) 20 MG tablet Use 1 daily as needed for occasional foot and ankle swelling 30 tablet 3  . guaifenesin (ROBITUSSIN) 100 MG/5ML syrup Take 10 mLs (200 mg total) by mouth 3 (three) times daily as needed for cough. 120 mL 1  . ibuprofen (ADVIL,MOTRIN) 800 MG tablet Take 1 tablet (800 mg total) by mouth 3 (three) times daily. 21 tablet 0  . metoprolol tartrate (LOPRESSOR) 25 MG tablet Take 1 tablet (25 mg total) by mouth 2 (two) times daily. 180 tablet 3  . Multiple Vitamin (MULTIVITAMIN) tablet Take 2 tablets by mouth every morning.     . predniSONE (DELTASONE) 20 MG tablet 3 tabs po day one, then 2 po daily x 4 days 11 tablet 0  . tiotropium (SPIRIVA HANDIHALER) 18 MCG inhalation capsule Place 1 capsule (18 mcg total) into inhaler and inhale daily. 30 capsule 11   No current facility-administered medications on file prior to visit.     Review of Systems:  As per HPI- otherwise negative.   Physical Examination: Vitals:   01/23/17 0851  BP: 122/62  Pulse: 93  Temp: 97.8 F (36.6 C)   Vitals:   01/23/17 0851  Weight: 146 lb 3.2 oz (66.3 kg)  Height: 5\' 5"  (1.651 m)   Body mass index is 24.33 kg/m. Ideal Body Weight: Weight in (lb) to have BMI = 25: 149.9  GEN: WDWN, NAD, Non-toxic, A & O x 3, normal weight, looks well HEENT: Atraumatic, Normocephalic. Neck supple. No masses, No LAD. Ears and Nose: No external deformity. CV: RRR, No M/G/R. No JVD. No thrill. No extra heart sounds. PULM: mild wheezing  as is baseline with her COPD, nocrackles, rhonchi. No retractions. No resp. distress. No accessory muscle use. EXTR: No c/c NEURO Normal gait.  PSYCH: Normally interactive. Conversant. Not depressed or anxious appearing.  Calm demeanor.  Left foot: minimal swelling of the foot, but no redness or heat She shows me a photo of her foot that Percell Miller took last week- significantly better    Assessment and Plan: Skin infection  Edema of  foot  History of alcohol abuse  COPD, group C, by GOLD 2013 classification ([place])  GAD (generalized anxiety disorder)  Here today to recheck cellulitis of her left foot Appears to be improved- continue to use doxycycline until finished.  She has a history of mild edema of her left foot- she has had a doppler in April which was negative.  Likely has some degree of venous insuf.  Asked her to let me know if not continuing to get better She states that she is drinking less- will plan to repeat her labs next month.   Discussed her anxiety surrounding living alone; however at this time she is not ready to change her living situation Her COPD is stable, continue to use inhalers - symbicort and spiriva She sees her pulmonologist on a regular basis - appt next month Signed Lamar Blinks, MD

## 2017-01-23 ENCOUNTER — Ambulatory Visit (INDEPENDENT_AMBULATORY_CARE_PROVIDER_SITE_OTHER): Payer: PPO | Admitting: Family Medicine

## 2017-01-23 VITALS — BP 122/62 | HR 93 | Temp 97.8°F | Ht 65.0 in | Wt 146.2 lb

## 2017-01-23 DIAGNOSIS — J449 Chronic obstructive pulmonary disease, unspecified: Secondary | ICD-10-CM | POA: Diagnosis not present

## 2017-01-23 DIAGNOSIS — L089 Local infection of the skin and subcutaneous tissue, unspecified: Secondary | ICD-10-CM

## 2017-01-23 DIAGNOSIS — R600 Localized edema: Secondary | ICD-10-CM | POA: Diagnosis not present

## 2017-01-23 DIAGNOSIS — Z87898 Personal history of other specified conditions: Secondary | ICD-10-CM | POA: Diagnosis not present

## 2017-01-23 DIAGNOSIS — F411 Generalized anxiety disorder: Secondary | ICD-10-CM

## 2017-01-23 DIAGNOSIS — F1011 Alcohol abuse, in remission: Secondary | ICD-10-CM

## 2017-01-23 NOTE — Patient Instructions (Addendum)
Please finish out the doxycycline- I think that your foot looks better.   Please come in to have your liver function tested and have a check up in July as planned Let me know if your foot does not continue to get better

## 2017-02-16 ENCOUNTER — Ambulatory Visit: Payer: PPO | Admitting: Family Medicine

## 2017-02-23 ENCOUNTER — Ambulatory Visit (INDEPENDENT_AMBULATORY_CARE_PROVIDER_SITE_OTHER): Payer: PPO | Admitting: Adult Health

## 2017-02-23 ENCOUNTER — Ambulatory Visit: Payer: PPO | Admitting: Pulmonary Disease

## 2017-02-23 ENCOUNTER — Encounter: Payer: Self-pay | Admitting: Adult Health

## 2017-02-23 DIAGNOSIS — J449 Chronic obstructive pulmonary disease, unspecified: Secondary | ICD-10-CM | POA: Diagnosis not present

## 2017-02-23 NOTE — Patient Instructions (Addendum)
Your blood pressure is elevated, please see your Primary MD for this .  Continue on Symbicort 2 puffs Twice daily   May use  Ipratropium /Albuterol Neb every 6hr as needed.  May use Proventil Inhaler 2 puffs every 4hrs or Albuterol Neb every 4hrs as needed for wheezing -this is your rescue medication.  Follow up Dr. Elsworth Soho in 2  -3 months  At [facility] office.  Please contact office for sooner follow up if symptoms do not improve or worsen or seek emergency care

## 2017-02-23 NOTE — Progress Notes (Signed)
@Patient  ID: Jenna Marquez, female    DOB: 05-22-1946, 71 y.o.   MRN: [id number]  Chief Complaint  Patient presents with  . Follow-up    COPD     Referring provider: Copland, Gay Filler, MD  HPI: 71 yo female with COPD -GOLD 3 , former smoker .  Uses e cigs on/off   Significant tests/ events 09/2010 FeV1 49% Fef 25 75 22%  Ct chest 07/2012 - no nodules/copd changes  CTA Chest 08/2015 , neg PE, COPD changes  Spirometry 7/2017shows FEV1 46%, , ratio 35 , FVC 102%, mid flows 20%.   02/23/2017 Follow up : COPD   Pt returns for 4 month follow up for COPD . She is prone to COPD flare and hospitalizations for COPD . She remains on Symbicort Twice daily  . Last visit she wanted to change from duoneb to spiriva . Spiriva was sent to pharmacy . Says she did not go by to get spiriva at pharmacy .Decided to stay on nebs.  Currently has ipratropium /albuterol that she takes 1/2 vial 2-3 times a day , she decides when she wants to take. Says she feels the best she has felt in long time.  PVX and Prevnar utd  Denies flare of cough , wheezing or edema .    Allergies  Allergen Reactions  . Avelox [Moxifloxacin Hcl In Nacl] Itching and Rash    Immunization History  Administered Date(s) Administered  . Influenza Split 07/14/2011, 07/24/2012, 04/29/2013  . Influenza,inj,Quad PF,36+ Mos 08/28/2014, 09/22/2015, 10/02/2016  . Pneumococcal Conjugate-13 10/30/2014  . Pneumococcal Polysaccharide-23 07/14/2011  . Zoster 03/23/2011    Past Medical History:  Diagnosis Date  . Allergic rhinitis   . Anxiety    "occasionally"  . Asthma   . COPD (chronic obstructive pulmonary disease) ([place])   . GERD (gastroesophageal reflux disease)   . Mobitz type 2 second degree AV block 01/06/2015    Tobacco History: History  Smoking Status  . Former Smoker  . Packs/day: 1.00  . Years: 35.00  . Types: Cigarettes  . Quit date: 08/10/2012  Smokeless Tobacco  . Never Used    Comment: Started at age 78     Counseling given: Not Answered   Outpatient Encounter Prescriptions as of 02/23/2017  Medication Sig  . albuterol (PROVENTIL HFA;VENTOLIN HFA) 108 (90 Base) MCG/ACT inhaler Inhale 2 puffs into the lungs every 6 (six) hours as needed for wheezing or shortness of breath.  Marland Kitchen albuterol (PROVENTIL) (2.5 MG/3ML) 0.083% nebulizer solution Take 3 mLs (2.5 mg total) by nebulization every 4 (four) hours as needed for wheezing or shortness of breath.  . ALPRAZolam (XANAX) 0.5 MG tablet Take 1 tablet (0.5 mg total) by mouth at bedtime as needed for sleep. Do not take with alcohol  . aspirin EC 81 MG EC tablet Take 1 tablet (81 mg total) by mouth daily.  . benzonatate (TESSALON) 100 MG capsule Take 1 capsule (100 mg total) by mouth every 8 (eight) hours.  . budesonide-formoterol (SYMBICORT) 160-4.5 MCG/ACT inhaler Inhale 2 puffs into the lungs 2 (two) times daily.  . calcium-vitamin D (OSCAL WITH D) 250-125 MG-UNIT tablet Take 1 tablet by mouth daily.  . Cholecalciferol (VITAMIN D) 2000 UNITS CAPS Take 1 capsule by mouth daily.  Marland Kitchen doxycycline (VIBRA-TABS) 100 MG tablet Take 1 tablet (100 mg total) by mouth 2 (two) times daily.  Marland Kitchen doxycycline (VIBRAMYCIN) 100 MG capsule Take 1 capsule (100 mg total) by mouth 2 (two) times daily. One po bid x  7 days  . furosemide (LASIX) 20 MG tablet Use 1 daily as needed for occasional foot and ankle swelling  . guaifenesin (ROBITUSSIN) 100 MG/5ML syrup Take 10 mLs (200 mg total) by mouth 3 (three) times daily as needed for cough.  Marland Kitchen ibuprofen (ADVIL,MOTRIN) 800 MG tablet Take 1 tablet (800 mg total) by mouth 3 (three) times daily.  Marland Kitchen ipratropium-albuterol (DUONEB) 0.5-2.5 (3) MG/3ML SOLN Take 3 mLs by nebulization every 6 (six) hours as needed.  . metoprolol tartrate (LOPRESSOR) 25 MG tablet Take 1 tablet (25 mg total) by mouth 2 (two) times daily.  . Multiple Vitamin (MULTIVITAMIN) tablet Take 2 tablets by mouth every morning.   . tiotropium (SPIRIVA HANDIHALER) 18 MCG  inhalation capsule Place 1 capsule (18 mcg total) into inhaler and inhale daily.  . [DISCONTINUED] albuterol (PROVENTIL) (2.5 MG/3ML) 0.083% nebulizer solution Take 3 mLs (2.5 mg total) by nebulization 2 (two) times daily.  . [DISCONTINUED] predniSONE (DELTASONE) 20 MG tablet 3 tabs po day one, then 2 po daily x 4 days   No facility-administered encounter medications on file as of 02/23/2017.      Review of Systems  Constitutional:   No  weight loss, night sweats,  Fevers, chills, fatigue, or  lassitude.  HEENT:   No headaches,  Difficulty swallowing,  Tooth/dental problems, or  Sore throat,                No sneezing, itching, ear ache, nasal congestion, post nasal drip,   CV:  No chest pain,  Orthopnea, PND, swelling in lower extremities, anasarca, dizziness, palpitations, syncope.   GI  No heartburn, indigestion, abdominal pain, nausea, vomiting, diarrhea, change in bowel habits, loss of appetite, bloody stools.   Resp: No shortness of breath with exertion or at rest.  No excess mucus, no productive cough,  No non-productive cough,  No coughing up of blood.  No change in color of mucus.  No wheezing.  No chest wall deformity  Skin: no rash or lesions.  GU: no dysuria, change in color of urine, no urgency or frequency.  No flank pain, no hematuria   MS:  No joint pain or swelling.  No decreased range of motion.  No back pain.    Physical Exam  BP 130/80 (BP Location: Left Arm, Patient Position: Sitting, Cuff Size: Normal)   Pulse 84   Ht 5\' 5"  (1.651 m)   Wt 146 lb (66.2 kg)   SpO2 95%   BMI 24.30 kg/m   GEN: A/Ox3; pleasant , NAD, thin and elderly    HEENT:  [place]/AT,  EACs-clear, TMs-wnl, NOSE-clear, THROAT-clear, no lesions, no postnasal drip or exudate noted.   NECK:  Supple w/ fair ROM; no JVD; normal carotid impulses w/o bruits; no thyromegaly or nodules palpated; no lymphadenopathy.    RESP  Decreased BS in bases  no accessory muscle use, no dullness to  percussion  CARD:  RRR, no m/r/g, no peripheral edema, pulses intact, no cyanosis or clubbing.  GI:   Soft & nt; nml bowel sounds; no organomegaly or masses detected.   Musco: Warm bil, no deformities or joint swelling noted.   Neuro: alert, no focal deficits noted.    Skin: Warm, no lesions or rashes     Lab Results:  CBC  Imaging: No results found.   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 Compensated on present regimen   Plan  Patient Instructions  Your blood pressure is elevated, please  see your Primary MD for this .  Continue on Symbicort 2 puffs Twice daily   May use  Ipratropium /Albuterol Neb every 6hr as needed.  May use Proventil Inhaler 2 puffs every 4hrs or Albuterol Neb every 4hrs as needed for wheezing -this is your rescue medication.  Follow up Dr. Elsworth Soho in 2  -3 months  At [facility] office.  Please contact office for sooner follow up if symptoms do not improve or worsen or seek emergency care           Rexene Edison, NP 02/23/2017

## 2017-02-23 NOTE — Assessment & Plan Note (Signed)
Compensated on present regimen   Plan  Patient Instructions  Your blood pressure is elevated, please see your Primary MD for this .  Continue on Symbicort 2 puffs Twice daily   May use  Ipratropium /Albuterol Neb every 6hr as needed.  May use Proventil Inhaler 2 puffs every 4hrs or Albuterol Neb every 4hrs as needed for wheezing -this is your rescue medication.  Follow up Dr. Elsworth Soho in 2  -3 months  At [facility] office.  Please contact office for sooner follow up if symptoms do not improve or worsen or seek emergency care

## 2017-02-24 NOTE — Progress Notes (Signed)
Reviewed & agree with plan  

## 2017-02-27 MED FILL — FUROSEMIDE 20 MG TAB: 20 | 30 days supply | Qty: 30 | Fill #1

## 2017-03-01 ENCOUNTER — Other Ambulatory Visit: Payer: Self-pay | Admitting: Pulmonary Disease

## 2017-03-01 ENCOUNTER — Telehealth: Payer: Self-pay | Admitting: Cardiology

## 2017-03-01 ENCOUNTER — Ambulatory Visit (INDEPENDENT_AMBULATORY_CARE_PROVIDER_SITE_OTHER): Payer: PPO | Admitting: *Deleted

## 2017-03-01 DIAGNOSIS — I442 Atrioventricular block, complete: Secondary | ICD-10-CM | POA: Diagnosis not present

## 2017-03-01 NOTE — Telephone Encounter (Signed)
Spoke with pt and reminded pt of remote transmission that is due today. Pt verbalized understanding.   

## 2017-03-02 ENCOUNTER — Encounter: Payer: Self-pay | Admitting: Cardiology

## 2017-03-02 ENCOUNTER — Ambulatory Visit: Payer: PPO | Admitting: Family Medicine

## 2017-03-02 LAB — CUP PACEART REMOTE DEVICE CHECK
Battery Remaining Longevity: 119 mo
Brady Statistic AS VS Percent: 5 %
Date Time Interrogation Session: 20180718202114
Implantable Lead Implant Date: 20160525
Implantable Lead Implant Date: 20160525
Implantable Lead Location: 753859
Lead Channel Impedance Value: 608 Ohm
Lead Channel Pacing Threshold Amplitude: 0.625 V
Lead Channel Setting Pacing Amplitude: 1.5 V
Lead Channel Setting Pacing Amplitude: 2.5 V
Lead Channel Setting Pacing Pulse Width: 0.4 ms
Lead Channel Setting Sensing Sensitivity: 5.6 mV
MDC IDC LEAD LOCATION: 753860
MDC IDC MSMT BATTERY IMPEDANCE: 158 Ohm
MDC IDC MSMT BATTERY VOLTAGE: 2.79 V
MDC IDC MSMT LEADCHNL RA IMPEDANCE VALUE: 564 Ohm
MDC IDC MSMT LEADCHNL RA PACING THRESHOLD AMPLITUDE: 0.5 V
MDC IDC MSMT LEADCHNL RA PACING THRESHOLD PULSEWIDTH: 0.4 ms
MDC IDC MSMT LEADCHNL RV PACING THRESHOLD PULSEWIDTH: 0.4 ms
MDC IDC PG IMPLANT DT: 20160525
MDC IDC STAT BRADY AP VP PERCENT: 1 %
MDC IDC STAT BRADY AP VS PERCENT: 0 %
MDC IDC STAT BRADY AS VP PERCENT: 94 %

## 2017-03-02 NOTE — Progress Notes (Signed)
Remote pacemaker transmission.   

## 2017-03-03 ENCOUNTER — Telehealth: Payer: Self-pay | Admitting: *Deleted

## 2017-03-03 NOTE — Telephone Encounter (Signed)
Notes recorded by Constance Haw, MD on 03/03/2017 at 12:03 PM EDT Abnormal device interrogation reviewed. Lead parameters and battery status stable. Short atrial and ventricluar runs seen. Increase metoprolol to 50 mg BID.  Spoke with patient regarding recommendations from Dr. Curt Bears to increase metoprolol tartrate.  Patient reports that she has only taken this medication "once or twice" because she feels it makes her ShOB.  She states that she was so ShOB after taking this medication that she could not walk to her kitchen without stopping to rest.  Patient has been asymptomatic with episodes.  Patient is agreeable to appointment with Dr. Curt Bears on 03/13/17 at 11:30am to discuss alternative options.  Patient is appreciative of call and denies additional questions or concerns at this time.

## 2017-03-13 ENCOUNTER — Encounter: Payer: PPO | Admitting: Cardiology

## 2017-03-13 NOTE — Progress Notes (Deleted)
Electrophysiology Office Note   Date:  03/13/2017   ID:  Jenna Marquez, DOB [birth year], MRN [id number]  PCP:  Darreld Mclean, MD  Cardiologist:  Lovena Le Primary Electrophysiologist:  Will Meredith Leeds, MD    No chief complaint on file.    History of Present Illness: Jenna Marquez is a 71 y.o. female who presents today for electrophysiology evaluation.   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 She was found to have atrial fibrillation on her heart monitor. Had previous discussions with her about wanting anticoagulation, but she has declined in the past. Today she says that she is a little short of breath and is wheezing. She says that this is due to her asthma and COPD. She is recently taken breathing treatments.  Today, denies symptoms of palpitations, chest pain, shortness of breath, orthopnea, PND, lower extremity edema, claudication, dizziness, presyncope, syncope, bleeding, or neurologic sequela. The patient is tolerating medications without difficulties and is otherwise without complaint today. ***   Past Medical History:  Diagnosis Date  . Allergic rhinitis   . Anxiety    "occasionally"  . Asthma   . COPD (chronic obstructive pulmonary disease) ([place])   . GERD (gastroesophageal reflux disease)   . Mobitz type 2 second degree AV block 01/06/2015   Past Surgical History:  Procedure Laterality Date  . [place]  . EP IMPLANTABLE DEVICE N/A 01/07/2015   Procedure: Pacemaker Implant;  Surgeon: Evans Lance, MD;  Location: [place] [facility];  Service: Cardiovascular;  Laterality: N/A;  . PACEMAKER INSERTION    . TONSILLECTOMY AND ADENOIDECTOMY  1954     Current Outpatient Prescriptions  Medication Sig Dispense Refill  . albuterol (PROVENTIL HFA;VENTOLIN HFA) 108 (90 Base) MCG/ACT inhaler Inhale 2 puffs into the lungs every 6 (six) hours as needed for wheezing or shortness of  breath. 1 Inhaler 2  . albuterol (PROVENTIL) (2.5 MG/3ML) 0.083% nebulizer solution Take 3 mLs (2.5 mg total) by nebulization every 4 (four) hours as needed for wheezing or shortness of breath. 75 mL 12  . albuterol (PROVENTIL) (2.5 MG/3ML) 0.083% nebulizer solution PUFF AND INHALE 4 TIMES DAILY**SUBTRACT 1 BOX ON NEXT REFILL, GAVE 1 BOX ON 7/11** 375 mL 1  . ALPRAZolam (XANAX) 0.5 MG tablet Take 1 tablet (0.5 mg total) by mouth at bedtime as needed for sleep. Do not take with alcohol 30 tablet 0  . aspirin EC 81 MG EC tablet Take 1 tablet (81 mg total) by mouth daily. 30 tablet 0  . benzonatate (TESSALON) 100 MG capsule Take 1 capsule (100 mg total) by mouth every 8 (eight) hours. 21 capsule 0  . budesonide-formoterol (SYMBICORT) 160-4.5 MCG/ACT inhaler Inhale 2 puffs into the lungs 2 (two) times daily. 3 Inhaler 1  . calcium-vitamin D (OSCAL WITH D) 250-125 MG-UNIT tablet Take 1 tablet by mouth daily.    . Cholecalciferol (VITAMIN D) 2000 UNITS CAPS Take 1 capsule by mouth daily.    Marland Kitchen doxycycline (VIBRA-TABS) 100 MG tablet Take 1 tablet (100 mg total) by mouth 2 (two) times daily. 20 tablet 0  . doxycycline (VIBRAMYCIN) 100 MG capsule Take 1 capsule (100 mg total) by mouth 2 (two) times daily. One po bid x 7 days 14 capsule 0  . furosemide (LASIX) 20 MG tablet Use 1 daily as needed for occasional foot and ankle swelling 30 tablet 3  . guaifenesin (ROBITUSSIN) 100 MG/5ML syrup Take 10 mLs (  200 mg total) by mouth 3 (three) times daily as needed for cough. 120 mL 1  . ibuprofen (ADVIL,MOTRIN) 800 MG tablet Take 1 tablet (800 mg total) by mouth 3 (three) times daily. 21 tablet 0  . ipratropium-albuterol (DUONEB) 0.5-2.5 (3) MG/3ML SOLN Take 3 mLs by nebulization every 6 (six) hours as needed.    . metoprolol tartrate (LOPRESSOR) 25 MG tablet Take 1 tablet (25 mg total) by mouth 2 (two) times daily. 180 tablet 3  . Multiple Vitamin (MULTIVITAMIN) tablet Take 2 tablets by mouth every morning.     .  tiotropium (SPIRIVA HANDIHALER) 18 MCG inhalation capsule Place 1 capsule (18 mcg total) into inhaler and inhale daily. 30 capsule 11   No current facility-administered medications for this visit.     Allergies:   Avelox [moxifloxacin hcl in nacl]   Social History:  The patient  reports that she quit smoking about 4 years ago. Her smoking use included Cigarettes. She has a 35.00 pack-year smoking history. She has never used smokeless tobacco. She reports that she drinks about 16.8 oz of alcohol per week . She reports that she does not use drugs.   Family History:  The patient's family history includes Coronary artery disease in her mother; Heart disease in her mother.    ROS:  Please see the history of present illness.   Otherwise, review of systems is positive for ***.   All other systems are reviewed and negative.   PHYSICAL EXAM: VS:  There were no vitals taken for this visit. , BMI There is no height or weight on file to calculate BMI. GEN: Well nourished, well developed, in no acute distress  HEENT: normal  Neck: no JVD, carotid bruits, or masses Cardiac: ***RRR; no murmurs, rubs, or gallops,no edema  Respiratory:  clear to auscultation bilaterally, normal work of breathing GI: soft, nontender, nondistended, + BS MS: no deformity or atrophy  Skin: warm and dry, ***device site well healed Neuro:  Strength and sensation are intact Psych: euthymic mood, full affect  EKG:  EKG {ACTION; IS/IS [id number]} ordered today. Personal review of the ekg ordered *** shows ***  ***Personal review of the device interrogation today. Results in [place]: 09/30/2016: Magnesium 2.1 11/24/2016: ALT 54; BUN 11; Creatinine, Ser 1.03; Hemoglobin 14.2; Platelets 242.0; Potassium 4.4; Sodium 139    Lipid Panel     Component Value Date/Time   CHOL 220 (H) 11/24/2016 1035   TRIG 52.0 11/24/2016 1035   HDL 129.40 11/24/2016 1035   CHOLHDL 2 11/24/2016 1035   VLDL 10.4 11/24/2016 1035     LDLCALC 81 11/24/2016 1035   LDLDIRECT 114.2 01/30/2013 1133     Wt Readings from Last 3 Encounters:  02/23/17 146 lb (66.2 kg)  01/23/17 146 lb 3.2 oz (66.3 kg)  01/17/17 145 lb 6.4 oz (66 kg)      Other studies Reviewed: Additional studies/ records that were reviewed today include:  TTE 01/07/15  Review of the above records today demonstrates:  - Left ventricle: The cavity size was normal. Wall thickness was normal. The estimated ejection fraction was 65%. Wall motion was normal; there were no regional wall motion abnormalities. - Aortic valve: Sclerosis without stenosis. There was no significant regurgitation. - Right ventricle: The cavity size was normal. Lateral annulus peak S velocity: 19.4 cm/s.   ASSESSMENT AND PLAN:  1.  2:1 AV block: Dual-chamber pacemaker in place, functioning appropriately. No changes necessary at this time. She is tachycardic today, but  she does say that this is due to the breathing treatments that she has done.***  2. Paroxysmal atrial fibrillation: Incidentally found on her monitor. She was asymptomatic. She has a CHADS2VASc of 2 for age and female gender. We discussed the options of anticoagulation including Coumadin versus normal anticoagulants. She continues to refuse anticoagulation. I told her that if she changes her mind to call the clinic and we'll have further discussions.***  Current medicines are reviewed at length with the patient today.   The patient does not have concerns regarding her medicines.  The following changes were made today:  ***  Labs/ tests ordered today include:  No orders of the defined types were placed in this encounter.    Disposition:   FU with Will Camnitz *** year  Signed, Will Meredith Leeds, MD  03/13/2017 9:20 AM     [facility] [address] [place] [address] [phone] (office) [phone] (fax)

## 2017-03-14 ENCOUNTER — Ambulatory Visit (INDEPENDENT_AMBULATORY_CARE_PROVIDER_SITE_OTHER): Payer: PPO | Admitting: Cardiology

## 2017-03-14 ENCOUNTER — Encounter: Payer: Self-pay | Admitting: Cardiology

## 2017-03-14 VITALS — BP 124/78 | HR 90 | Ht 65.0 in | Wt 146.4 lb

## 2017-03-14 DIAGNOSIS — I480 Paroxysmal atrial fibrillation: Secondary | ICD-10-CM

## 2017-03-14 DIAGNOSIS — I442 Atrioventricular block, complete: Secondary | ICD-10-CM

## 2017-03-14 DIAGNOSIS — Z950 Presence of cardiac pacemaker: Secondary | ICD-10-CM

## 2017-03-14 LAB — CUP PACEART INCLINIC DEVICE CHECK
Date Time Interrogation Session: 20180731155502
Implantable Lead Implant Date: 20160525
Implantable Lead Location: 753859
Implantable Lead Location: 753860
Implantable Lead Model: 5076
Implantable Lead Model: 5076
Implantable Pulse Generator Implant Date: 20160525
MDC IDC LEAD IMPLANT DT: 20160525

## 2017-03-14 MED ORDER — DILTIAZEM HCL ER COATED BEADS 180 MG PO CP24: 180.0000 mg | ORAL_CAPSULE | Freq: Every day | ORAL | 3 refills | Status: DC

## 2017-03-14 NOTE — Patient Instructions (Addendum)
Medication Instructions:  Your physician has recommended you make the following change in your medication: 1. STOP Metoprolol 2. START Diltiazem 180 mg daily  --- If you need a refill on your cardiac medications before your next appointment, please call your pharmacy. ---  Labwork: None ordered  Testing/Procedures: None ordered  Follow-Up: Remote monitoring is used to monitor your Pacemaker of ICD from home. This monitoring reduces the number of office visits required to check your device to one time per year. It allows [place] to keep an eye on the functioning of your device to ensure it is working properly. You are scheduled for a device check from home on 05/31/2017. You may send your transmission at any time that day. If you have a wireless device, the transmission will be sent automatically. After your physician reviews your transmission, you will receive a postcard with your next transmission date.  Your physician wants you to follow-up in: January 2019 with Dr. Curt Bears.  You will receive a reminder letter in the mail two months in advance. If you don't receive a letter, please call our office to schedule the follow-up appointment.   Thank you for choosing [facility]!!   Trinidad Curet, RN [phone]

## 2017-03-14 NOTE — Progress Notes (Signed)
Electrophysiology Office Note   Date:  03/14/2017   ID:  Jenna Marquez, DOB 07/01/1946, MRN [id number]  PCP:  Darreld Mclean, MD  Cardiologist:  Lovena Le Primary Electrophysiologist:  Vinson Tietze Meredith Leeds, MD    Chief Complaint  Patient presents with  . Pacemaker Check    Complete heart block     History of Present Illness: Jenna Marquez is a 71 y.o. female who presents today for electrophysiology evaluation.   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   Today, denies symptoms of palpitations, chest pain, shortness of breath, orthopnea, PND, lower extremity edema, claudication, dizziness, presyncope, syncope, bleeding, or neurologic sequela. The patient is tolerating medications without difficulties and is otherwise without complaint today. She is feeling well overall. She does not have episodes of palpitations she has had shortness of breath, but this is likely due to her COPD and asthma according to the patient.    Past Medical History:  Diagnosis Date  . Allergic rhinitis   . Anxiety    "occasionally"  . Asthma   . COPD (chronic obstructive pulmonary disease) ([place])   . GERD (gastroesophageal reflux disease)   . Mobitz type 2 second degree AV block 01/06/2015   Past Surgical History:  Procedure Laterality Date  . [place]  . EP IMPLANTABLE DEVICE N/A 01/07/2015   Procedure: Pacemaker Implant;  Surgeon: Evans Lance, MD;  Location: [place] [facility];  Service: Cardiovascular;  Laterality: N/A;  . PACEMAKER INSERTION    . TONSILLECTOMY AND ADENOIDECTOMY  1954     Current Outpatient Prescriptions  Medication Sig Dispense Refill  . albuterol (PROVENTIL HFA;VENTOLIN HFA) 108 (90 Base) MCG/ACT inhaler Inhale 2 puffs into the lungs every 6 (six) hours as needed for wheezing or shortness of breath. 1 Inhaler 2  . albuterol (PROVENTIL) (2.5 MG/3ML) 0.083% nebulizer solution Take 3 mLs  (2.5 mg total) by nebulization every 4 (four) hours as needed for wheezing or shortness of breath. 75 mL 12  . albuterol (PROVENTIL) (2.5 MG/3ML) 0.083% nebulizer solution PUFF AND INHALE 4 TIMES DAILY**SUBTRACT 1 BOX ON NEXT REFILL, GAVE 1 BOX ON 7/11** 375 mL 1  . ALPRAZolam (XANAX) 0.5 MG tablet Take 1 tablet (0.5 mg total) by mouth at bedtime as needed for sleep. Do not take with alcohol 30 tablet 0  . aspirin EC 81 MG EC tablet Take 1 tablet (81 mg total) by mouth daily. 30 tablet 0  . benzonatate (TESSALON) 100 MG capsule Take 1 capsule (100 mg total) by mouth every 8 (eight) hours. 21 capsule 0  . budesonide-formoterol (SYMBICORT) 160-4.5 MCG/ACT inhaler Inhale 2 puffs into the lungs 2 (two) times daily. 3 Inhaler 1  . calcium-vitamin D (OSCAL WITH D) 250-125 MG-UNIT tablet Take 1 tablet by mouth daily.    . Cholecalciferol (VITAMIN D) 2000 UNITS CAPS Take 1 capsule by mouth daily.    Marland Kitchen doxycycline (VIBRA-TABS) 100 MG tablet Take 1 tablet (100 mg total) by mouth 2 (two) times daily. 20 tablet 0  . furosemide (LASIX) 20 MG tablet Use 1 daily as needed for occasional foot and ankle swelling 30 tablet 3  . ibuprofen (ADVIL,MOTRIN) 800 MG tablet Take 1 tablet (800 mg total) by mouth 3 (three) times daily. 21 tablet 0  . ipratropium-albuterol (DUONEB) 0.5-2.5 (3) MG/3ML SOLN Take 3 mLs by nebulization every 6 (six) hours as needed.    . metoprolol tartrate (LOPRESSOR) 25 MG  tablet Take 1 tablet (25 mg total) by mouth 2 (two) times daily. 180 tablet 3  . Multiple Vitamin (MULTIVITAMIN) tablet Take 2 tablets by mouth every morning.      No current facility-administered medications for this visit.     Allergies:   Avelox [moxifloxacin hcl in nacl]   Social History:  The patient  reports that she quit smoking about 4 years ago. Her smoking use included Cigarettes. She has a 35.00 pack-year smoking history. She has never used smokeless tobacco. She reports that she drinks about 16.8 oz of alcohol  per week . She reports that she does not use drugs.   Family History:  The patient's family history includes Coronary artery disease in her mother; Heart disease in her mother.    ROS:  Please see the history of present illness.   Otherwise, review of systems is positive for Weight gain, leg swelling, shortness of breath, wheezing, cough.   All other systems are reviewed and negative.   PHYSICAL EXAM: VS:  BP 124/78   Pulse 90   Ht 5\' 5"  (1.651 m)   Wt 146 lb 6.4 oz (66.4 kg)   BMI 24.36 kg/m  , BMI Body mass index is 24.36 kg/m. GEN: Well nourished, well developed, in no acute distress  HEENT: normal  Neck: no JVD, carotid bruits, or masses Cardiac: RRR; no murmurs, rubs, or gallops,no edema  Respiratory:  wheezing bilaterally, normal work of breathing GI: soft, nontender, nondistended, + BS MS: no deformity or atrophy  Skin: warm and dry, device site well healed Neuro:  Strength and sensation are intact Psych: euthymic mood, full affect  EKG:  EKG is not ordered today. Personal review of the ekg ordered 10/02/16 shows A sense, V pace  Personal review of the device interrogation today. Results in [place]: 09/30/2016: Magnesium 2.1 11/24/2016: ALT 54; BUN 11; Creatinine, Ser 1.03; Hemoglobin 14.2; Platelets 242.0; Potassium 4.4; Sodium 139    Lipid Panel     Component Value Date/Time   CHOL 220 (H) 11/24/2016 1035   TRIG 52.0 11/24/2016 1035   HDL 129.40 11/24/2016 1035   CHOLHDL 2 11/24/2016 1035   VLDL 10.4 11/24/2016 1035   LDLCALC 81 11/24/2016 1035   LDLDIRECT 114.2 01/30/2013 1133     Wt Readings from Last 3 Encounters:  03/14/17 146 lb 6.4 oz (66.4 kg)  02/23/17 146 lb (66.2 kg)  01/23/17 146 lb 3.2 oz (66.3 kg)      Other studies Reviewed: Additional studies/ records that were reviewed today include:  TTE 01/07/15  Review of the above records today demonstrates:  - Left ventricle: The cavity size was normal. Wall thickness was normal.  The estimated ejection fraction was 65%. Wall motion was normal; there were no regional wall motion abnormalities. - Aortic valve: Sclerosis without stenosis. There was no significant regurgitation. - Right ventricle: The cavity size was normal. Lateral annulus peak S velocity: 19.4 cm/s.   ASSESSMENT AND PLAN:  1.  2:1 AV block: Medtronic dual chamber pacemaker implanted 01/07/15. Device functioning properly. No changes at this time.  2. Paroxysmal atrial fibrillation: Has refused anticoagulation in the past. She has had some high ventricular rates that appear to be 1:1 tachycardia on her device. We'll start her on 180 mg of diltiazem today.  This patients CHA2DS2-VASc Score and unadjusted Ischemic Stroke Rate (% per year) is equal to 2.2 % stroke rate/year from a score of 2  Above score calculated as 1 point each  if present [CHF, HTN, DM, Vascular=MI/PAD/Aortic Plaque, Age if 19-74, or Female] Above score calculated as 2 points each if present [Age > 75, or Stroke/TIA/TE]    Current medicines are reviewed at length with the patient today.   The patient does not have concerns regarding her medicines.  The following changes were made today:  diltiazem  Labs/ tests ordered today include:  No orders of the defined types were placed in this encounter.    Disposition:   FU with Codi Folkerts 6 year  Signed, Yazid Pop Meredith Leeds, MD  08/30/202018 2:37 PM     [facility] [address] [place] [address] [phone] (office) [phone] (fax)

## 2017-03-16 ENCOUNTER — Encounter: Payer: Self-pay | Admitting: Cardiology

## 2017-03-19 NOTE — Progress Notes (Deleted)
[place] at [facility] [address] [place], [place] [address] [phone]  Date:  03/22/2017   Name:  Jenna Marquez   DOB:  1945-12-24   MRN:  [id number]  PCP:  Darreld Mclean, MD    Chief Complaint: No chief complaint on file.   History of Present Illness:  Jenna Marquez is a 71 y.o. very pleasant female patient who presents with the following:  Seen here in April for a CPE Here today seeking a CPE- history of COPD, CKD, pacemaker She had a BMP and A1c in February of this year She is not on medication for her BP- this normally looks ok, suspect high today as she has been using albuterol and also prednisone at home Flu shot, colonoscopy, mammogram are all UTD  She is seeing Dr. Elsworth Soho upstairs later today- pulmonology She did a neb treatment (duoneb) at home earlier today but is still feeling quite SOB We gave her another duoneb here in clinic that did help  She admits to feeling nervous at night- esp since her god-son moved back to [place] and she is living alone.  She had a large house on an acre of land and is pretty secluded. She will occasionally take a xanax at night to help herself sleep, and she does have an alarm.   She has thought about moving to a smaller place but feels overwhelmed by the thought of moving  She is a cardiology pt as well- Dr. Curt Bears- last saw him a couple of weeks ago:  1.  2:1 AV block: Medtronic dual chamber pacemaker implanted 01/07/15. Device functioning properly. No changes at this time. 2. Paroxysmal atrial fibrillation: Has refused anticoagulation in the past. She has had some high ventricular rates that appear to be 1:1 tachycardia on her device. We'll start her on 180 mg of diltiazem today.   Her most recent liver tests were quite elevated (presume due to [facility])- need to repeat today  Lab Results  Component Value Date   ALT 54 (H) 11/24/2016   AST 165 (H) 11/24/2016   ALKPHOS 116  11/24/2016   BILITOT 0.8 11/24/2016    Patient Active Problem List   Diagnosis Date Noted  . HTN (hypertension) 11/24/2016  . Paroxysmal atrial fibrillation ([place]) 09/30/2016  . Acute respiratory failure with hypoxia ([place]) 09/30/2016  . Mobitz type 2 second degree AV block 09/30/2016  . Swelling of arm 05/22/2015  . Left shoulder pain 05/22/2015  . Chest pain on breathing 05/11/2015  . CKD (chronic kidney disease) stage 3, GFR 30-59 ml/min 05/11/2015  . Pacemaker 04/22/2015  . Generalized anxiety disorder 01/11/2012  . 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   . Tobacco abuse     Past Medical History:  Diagnosis Date  . Allergic rhinitis   . Anxiety    "occasionally"  . Asthma   . COPD (chronic obstructive pulmonary disease) ([place])   . GERD (gastroesophageal reflux disease)   . Mobitz type 2 second degree AV block 01/06/2015    Past Surgical History:  Procedure Laterality Date  . [place]  . EP IMPLANTABLE DEVICE N/A 01/07/2015   Procedure: Pacemaker Implant;  Surgeon: Evans Lance, MD;  Location: [place] [facility];  Service: Cardiovascular;  Laterality: N/A;  . PACEMAKER INSERTION    . TONSILLECTOMY AND ADENOIDECTOMY  1954    Social History  Substance Use Topics  . Smoking status: Former Smoker  Packs/day: 1.00    Years: 35.00    Types: Cigarettes    Quit date: 08/10/2012  . Smokeless tobacco: Never Used     Comment: Started at age 18  . Alcohol use 16.8 oz/week    28 Shots of liquor per week     Comment: 01/06/2015 "2 shot mixed drinks, 2/day"    Family History  Problem Relation Age of Onset  . Coronary artery disease Mother   . Heart disease Mother        CHF  . Diabetes Neg Hx   . Cancer Neg Hx   . Stroke Neg Hx   . Colon cancer Neg Hx     Allergies  Allergen Reactions  . Avelox [Moxifloxacin Hcl In Nacl] Itching and Rash    Medication list has been reviewed and updated.  Current Outpatient  Prescriptions on File Prior to Visit  Medication Sig Dispense Refill  . albuterol (PROVENTIL HFA;VENTOLIN HFA) 108 (90 Base) MCG/ACT inhaler Inhale 2 puffs into the lungs every 6 (six) hours as needed for wheezing or shortness of breath. 1 Inhaler 2  . albuterol (PROVENTIL) (2.5 MG/3ML) 0.083% nebulizer solution Take 3 mLs (2.5 mg total) by nebulization every 4 (four) hours as needed for wheezing or shortness of breath. 75 mL 12  . albuterol (PROVENTIL) (2.5 MG/3ML) 0.083% nebulizer solution PUFF AND INHALE 4 TIMES DAILY**SUBTRACT 1 BOX ON NEXT REFILL, GAVE 1 BOX ON 7/11** 375 mL 1  . ALPRAZolam (XANAX) 0.5 MG tablet Take 1 tablet (0.5 mg total) by mouth at bedtime as needed for sleep. Do not take with alcohol 30 tablet 0  . aspirin EC 81 MG EC tablet Take 1 tablet (81 mg total) by mouth daily. 30 tablet 0  . benzonatate (TESSALON) 100 MG capsule Take 1 capsule (100 mg total) by mouth every 8 (eight) hours. 21 capsule 0  . budesonide-formoterol (SYMBICORT) 160-4.5 MCG/ACT inhaler Inhale 2 puffs into the lungs 2 (two) times daily. 3 Inhaler 1  . calcium-vitamin D (OSCAL WITH D) 250-125 MG-UNIT tablet Take 1 tablet by mouth daily.    . Cholecalciferol (VITAMIN D) 2000 UNITS CAPS Take 1 capsule by mouth daily.    Marland Kitchen diltiazem (CARDIZEM CD) 180 MG 24 hr capsule Take 1 capsule (180 mg total) by mouth daily. 90 capsule 3  . doxycycline (VIBRA-TABS) 100 MG tablet Take 1 tablet (100 mg total) by mouth 2 (two) times daily. 20 tablet 0  . furosemide (LASIX) 20 MG tablet Use 1 daily as needed for occasional foot and ankle swelling 30 tablet 3  . ibuprofen (ADVIL,MOTRIN) 800 MG tablet Take 1 tablet (800 mg total) by mouth 3 (three) times daily. 21 tablet 0  . ipratropium-albuterol (DUONEB) 0.5-2.5 (3) MG/3ML SOLN Take 3 mLs by nebulization every 6 (six) hours as needed.    . Multiple Vitamin (MULTIVITAMIN) tablet Take 2 tablets by mouth every morning.      No current facility-administered medications on file  prior to visit.     Review of Systems:  As per HPI- otherwise negative.   Physical Examination: There were no vitals filed for this visit. There were no vitals filed for this visit. There is no height or weight on file to calculate BMI. Ideal Body Weight:    GEN: WDWN, NAD, Non-toxic, A & O x 3 HEENT: Atraumatic, Normocephalic. Neck supple. No masses, No LAD. Ears and Nose: No external deformity. CV: RRR, No M/G/R. No JVD. No thrill. No extra heart sounds. PULM: CTA B, no  wheezes, crackles, rhonchi. No retractions. No resp. distress. No accessory muscle use. ABD: S, NT, ND, +BS. No rebound. No HSM. EXTR: No c/c/e NEURO Normal gait.  PSYCH: Normally interactive. Conversant. Not depressed or anxious appearing.  Calm demeanor.    Assessment and Plan: ***  Signed Lamar Blinks, MD

## 2017-03-22 ENCOUNTER — Ambulatory Visit: Payer: PPO | Admitting: Family Medicine

## 2017-03-25 ENCOUNTER — Other Ambulatory Visit: Payer: Self-pay | Admitting: Pulmonary Disease

## 2017-03-28 ENCOUNTER — Telehealth: Payer: Self-pay

## 2017-03-28 NOTE — Telephone Encounter (Signed)
Patient called in to [facility] on 03/27/17. States she accidentally cancelled her 8/15 appointment and now has possible rectal bleeding. Appointment rescheduled with [name]  On 03/30/17.

## 2017-03-29 ENCOUNTER — Ambulatory Visit: Payer: PPO | Admitting: Family Medicine

## 2017-03-29 NOTE — Progress Notes (Signed)
[facility] [address], [place], [place] [address] [phone]  Date:  03/30/2017   Name:  Jenna Marquez   DOB:  [birth year]   MRN:  [id number]  PCP:  Darreld Mclean, MD    Chief Complaint: Follow-up   History of Present Illness:  Jenna Marquez is a 71 y.o. very pleasant female patient who presents with the following:  History of second degree mobitz type heart block/ pacemaker, CKD, COPD, anxiety I last saw her in June- to follow-up on cellulitis of her foot, as below:  Here today to recheck cellulitis of her left foot Appears to be improved- continue to use doxycycline until finished.  She has a history of mild edema of her left foot- she has had a doppler in April which was negative.  Likely has some degree of venous insuf.  Asked her to let me know if not continuing to get better She states that she is drinking less- will plan to repeat her labs next month.   Discussed her anxiety surrounding living alone; however at this time she is not ready to change her living situation Her COPD is stable, continue to use inhalers - symbicort and spiriva She sees her pulmonologist on a regular basis - appt next month  This morning she was having some wheezing, and she did a duoneb treatment which did help - one at 6 am and another at 11 am  She actually takes her atrovent and albuterol in separate ampules and mixes them together- uses a 1/2 ampule of each per treatment Yesterday her breathing was fine She is still taking her symbicort on a regular basis She did have her grass cut yesterday and she is allergic to grass  No fever noted  She is taking lasix most days now for swelling in her left foot which is chronic.   She just had her pacemaker checked and was told that all looked ok Her cardiologist stopped her toprol and changed her to diltiazem- she feels more energetic on this medication  On Monday she noted a small  amount of blood from her rectum when she had a BM.  This has not happened before or since She did not have any pain  She did have a colonoscopy in 2014- she is due for her follow-up next year  States that she has a colonoscopy every 5 years generally due to polyps  Her GI doctor is Dr. Ferdinand Lango with [facility]- she will arrange to see him again soon  BP Readings from Last 3 Encounters:  03/30/17 (!) 139/56  03/14/17 124/78  02/23/17 130/80   Pulse Readings from Last 3 Encounters:  03/30/17 100  03/14/17 90  02/23/17 84     Patient Active Problem List   Diagnosis Date Noted  . HTN (hypertension) 11/24/2016  . Paroxysmal atrial fibrillation ([place]) 09/30/2016  . Acute respiratory failure with hypoxia ([employer]) 09/30/2016  . Mobitz type 2 second degree AV block 09/30/2016  . Swelling of arm 05/22/2015  . Left shoulder pain 05/22/2015  . Chest pain on breathing 05/11/2015  . CKD (chronic kidney disease) stage 3, GFR 30-59 ml/min 05/11/2015  . Pacemaker 04/22/2015  . Generalized anxiety disorder 01/11/2012  . 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   . Tobacco abuse     Past Medical History:  Diagnosis Date  . Allergic rhinitis   . Anxiety    "occasionally"  . Asthma   .  COPD (chronic obstructive pulmonary disease) ([place])   . GERD (gastroesophageal reflux disease)   . Mobitz type 2 second degree AV block 01/06/2015    Past Surgical History:  Procedure Laterality Date  . [place]  . EP IMPLANTABLE DEVICE N/A 01/07/2015   Procedure: Pacemaker Implant;  Surgeon: Evans Lance, MD;  Location: [facility];  Service: Cardiovascular;  Laterality: N/A;  . PACEMAKER INSERTION    . TONSILLECTOMY AND ADENOIDECTOMY  1954    Social History  Substance Use Topics  . Smoking status: Former Smoker    Packs/day: 1.00    Years: 35.00    Types: Cigarettes    Quit date: 08/10/2012  . Smokeless tobacco: Never Used     Comment: Started at age 56   . Alcohol use 16.8 oz/week    28 Shots of liquor per week     Comment: 01/06/2015 "2 shot mixed drinks, 2/day"    Family History  Problem Relation Age of Onset  . Coronary artery disease Mother   . Heart disease Mother        CHF  . Diabetes Neg Hx   . Cancer Neg Hx   . Stroke Neg Hx   . Colon cancer Neg Hx     Allergies  Allergen Reactions  . Avelox [Moxifloxacin Hcl In Nacl] Itching and Rash    Medication list has been reviewed and updated.  Current Outpatient Prescriptions on File Prior to Visit  Medication Sig Dispense Refill  . albuterol (PROVENTIL HFA;VENTOLIN HFA) 108 (90 Base) MCG/ACT inhaler Inhale 2 puffs into the lungs every 6 (six) hours as needed for wheezing or shortness of breath. 1 Inhaler 2  . ALPRAZolam (XANAX) 0.5 MG tablet Take 1 tablet (0.5 mg total) by mouth at bedtime as needed for sleep. Do not take with alcohol 30 tablet 0  . aspirin EC 81 MG EC tablet Take 1 tablet (81 mg total) by mouth daily. 30 tablet 0  . benzonatate (TESSALON) 100 MG capsule Take 1 capsule (100 mg total) by mouth every 8 (eight) hours. 21 capsule 0  . budesonide-formoterol (SYMBICORT) 160-4.5 MCG/ACT inhaler Inhale 2 puffs into the lungs 2 (two) times daily. 3 Inhaler 1  . calcium-vitamin D (OSCAL WITH D) 250-125 MG-UNIT tablet Take 1 tablet by mouth daily.    Marland Kitchen diltiazem (CARDIZEM CD) 180 MG 24 hr capsule Take 1 capsule (180 mg total) by mouth daily. 90 capsule 3  . doxycycline (VIBRA-TABS) 100 MG tablet Take 1 tablet (100 mg total) by mouth 2 (two) times daily. 20 tablet 0  . furosemide (LASIX) 20 MG tablet Use 1 daily as needed for occasional foot and ankle swelling 30 tablet 3  . ibuprofen (ADVIL,MOTRIN) 800 MG tablet Take 1 tablet (800 mg total) by mouth 3 (three) times daily. 21 tablet 0  . ipratropium-albuterol (DUONEB) 0.5-2.5 (3) MG/3ML SOLN Take 3 mLs by nebulization every 6 (six) hours as needed.    . Multiple Vitamin (MULTIVITAMIN) tablet Take 2 tablets by mouth every  morning.     . Cholecalciferol (VITAMIN D) 2000 UNITS CAPS Take 1 capsule by mouth daily.     No current facility-administered medications on file prior to visit.     Review of Systems:  As per HPI- otherwise negative. No fever or chills No CP or SOB No nausea, vomiting or diarrhea    Physical Examination: Vitals:   03/30/17 1605  BP: (!) 139/56  Pulse: 100  Temp: 98.6 F (37  C)  SpO2: 96%   Vitals:   03/30/17 1605  Weight: 148 lb 9.6 oz (67.4 kg)  Height: 5\' 5"  (1.651 m)   Body mass index is 24.73 kg/m. Ideal Body Weight: Weight in (lb) to have BMI = 25: 149.9  GEN: WDWN, NAD, Non-toxic, A & O x 3, looks well, here with her son today HEENT: Atraumatic, Normocephalic. Neck supple. No masses, No LAD. Ears and Nose: No external deformity. CV: RRR, No M/G/R. No JVD. No thrill. No extra heart sounds. PULM:mild wheezing bilaterally, crackles, rhonchi. No retractions. No resp. distress. No accessory muscle use. ABD: S, NT, ND EXTR: No c/c/slight edema of left foot which is normal for her NEURO Normal gait.  PSYCH: Normally interactive. Conversant. Not depressed or anxious appearing.  Calm demeanor.   Offered to give pt a neb treatment here today- she declines, will do at home   Assessment and Plan: Encounter for monitoring diuretic therapy - Plan: Comprehensive metabolic panel, CANCELED: Basic metabolic panel  Edema of foot  COPD, group C, by GOLD 2013 classification ([place]) - Plan: Comprehensive metabolic panel  Elevated liver function tests - Plan: Comprehensive metabolic panel  Here today for a follow-up visit She is having a bit of a wheezing exacerbation due to her grass being cut yesterday She will continue her usual home care routine for this situation but knows to seek care is not better in a day or two- Sooner if worse.  Check CMP today due to history of elevated LFTs and also diuretic use daily  Signed Lamar Blinks, MD  Results for orders placed or  performed in visit on 03/30/17  Comprehensive metabolic panel  Result Value Ref Range   Sodium 138 135 - 145 mEq/L   Potassium 4.4 3.5 - 5.1 mEq/L   Chloride 101 96 - 112 mEq/L   CO2 26 19 - 32 mEq/L   Glucose, Bld 76 70 - 99 mg/dL   BUN 17 6 - 23 mg/dL   Creatinine, Ser 1.18 0.40 - 1.20 mg/dL   Total Bilirubin 0.5 0.2 - 1.2 mg/dL   Alkaline Phosphatase 97 39 - 117 U/L   AST 83 (H) 0 - 37 U/L   ALT 30 0 - 35 U/L   Total Protein 6.5 6.0 - 8.3 g/dL   Albumin 3.6 3.5 - 5.2 g/dL   Calcium 9.7 8.4 - 10.5 mg/dL   GFR 57.67 (L) >60.00 mL/min   Letter to pt; LFTS are better.  We did an [place] for her earlier this year which showed fatty liver, and I asked her to cut down on alcohol.  She planned to try and do so

## 2017-03-30 ENCOUNTER — Ambulatory Visit (INDEPENDENT_AMBULATORY_CARE_PROVIDER_SITE_OTHER): Payer: PPO | Admitting: Family Medicine

## 2017-03-30 VITALS — BP 131/61 | HR 90 | Temp 98.6°F | Ht 65.0 in | Wt 148.6 lb

## 2017-03-30 DIAGNOSIS — J449 Chronic obstructive pulmonary disease, unspecified: Secondary | ICD-10-CM

## 2017-03-30 DIAGNOSIS — Z79899 Other long term (current) drug therapy: Secondary | ICD-10-CM | POA: Diagnosis not present

## 2017-03-30 DIAGNOSIS — R945 Abnormal results of liver function studies: Secondary | ICD-10-CM

## 2017-03-30 DIAGNOSIS — R600 Localized edema: Secondary | ICD-10-CM | POA: Diagnosis not present

## 2017-03-30 DIAGNOSIS — R7989 Other specified abnormal findings of blood chemistry: Secondary | ICD-10-CM | POA: Diagnosis not present

## 2017-03-30 DIAGNOSIS — Z5181 Encounter for therapeutic drug level monitoring: Secondary | ICD-10-CM

## 2017-03-30 NOTE — Patient Instructions (Signed)
It was good to see you today- we will check your potassium and make sure that you are not getting low with your regular lasix use  Please do schedule to see your GI doctor in the next couple of months,  They may wish to go ahead and do your colonoscopy this year due to your episode of bleeding  Your blood pressure looks fine today Continue to use your nebulizer treatment as needed for the next few days.  If your breathing does not get back to normal we can give you a few days of prednisone- please let me know if you are not better!

## 2017-03-31 ENCOUNTER — Encounter: Payer: Self-pay | Admitting: Family Medicine

## 2017-03-31 LAB — COMPREHENSIVE METABOLIC PANEL
ALT: 30 U/L (ref 0–35)
AST: 83 U/L — AB (ref 0–37)
Albumin: 3.6 g/dL (ref 3.5–5.2)
Alkaline Phosphatase: 97 U/L (ref 39–117)
BUN: 17 mg/dL (ref 6–23)
CO2: 26 mEq/L (ref 19–32)
CREATININE: 1.18 mg/dL (ref 0.40–1.20)
Calcium: 9.7 mg/dL (ref 8.4–10.5)
Chloride: 101 mEq/L (ref 96–112)
GFR: 57.67 mL/min — ABNORMAL LOW (ref >60.00)
Glucose, Bld: 76 mg/dL (ref 70–99)
Potassium: 4.4 mEq/L (ref 3.5–5.1)
Sodium: 138 mEq/L (ref 135–145)
Total Bilirubin: 0.5 mg/dL (ref 0.2–1.2)
Total Protein: 6.5 g/dL (ref 6.0–8.3)

## 2017-03-31 MED ORDER — ALBUTEROL SULFATE (2.5 MG/3ML) 0.083% IN NEBU: 2.5000 mg | INHALATION_SOLUTION | Freq: Four times a day (QID) | RESPIRATORY_TRACT | 0 refills | Status: DC | PRN

## 2017-03-31 MED ORDER — IPRATROPIUM BROMIDE 0.02 % IN SOLN: 0.5000 mg | Freq: Four times a day (QID) | RESPIRATORY_TRACT | 0 refills | Status: DC | PRN

## 2017-04-03 ENCOUNTER — Ambulatory Visit: Payer: PPO | Admitting: Family Medicine

## 2017-04-04 DIAGNOSIS — I739 Peripheral vascular disease, unspecified: Secondary | ICD-10-CM

## 2017-04-04 DIAGNOSIS — L84 Corns and callosities: Secondary | ICD-10-CM | POA: Diagnosis not present

## 2017-04-04 DIAGNOSIS — M2042 Other hammer toe(s) (acquired), left foot: Secondary | ICD-10-CM | POA: Diagnosis not present

## 2017-04-04 DIAGNOSIS — R0989 Other specified symptoms and signs involving the circulatory and respiratory systems: Secondary | ICD-10-CM | POA: Diagnosis not present

## 2017-04-04 HISTORY — DX: Peripheral vascular disease, unspecified: I73.9

## 2017-04-14 ENCOUNTER — Other Ambulatory Visit: Payer: Self-pay | Admitting: Family Medicine

## 2017-04-18 DIAGNOSIS — R0989 Other specified symptoms and signs involving the circulatory and respiratory systems: Secondary | ICD-10-CM | POA: Diagnosis not present

## 2017-04-24 ENCOUNTER — Other Ambulatory Visit: Payer: Self-pay | Admitting: Pulmonary Disease

## 2017-04-25 DIAGNOSIS — M2042 Other hammer toe(s) (acquired), left foot: Secondary | ICD-10-CM | POA: Diagnosis not present

## 2017-04-25 DIAGNOSIS — I739 Peripheral vascular disease, unspecified: Secondary | ICD-10-CM | POA: Diagnosis not present

## 2017-04-25 DIAGNOSIS — M2041 Other hammer toe(s) (acquired), right foot: Secondary | ICD-10-CM | POA: Diagnosis not present

## 2017-05-05 DIAGNOSIS — M7989 Other specified soft tissue disorders: Secondary | ICD-10-CM | POA: Diagnosis not present

## 2017-05-09 ENCOUNTER — Other Ambulatory Visit: Payer: Self-pay | Admitting: Family Medicine

## 2017-05-09 DIAGNOSIS — Z1231 Encounter for screening mammogram for malignant neoplasm of breast: Secondary | ICD-10-CM

## 2017-05-31 ENCOUNTER — Telehealth: Payer: Self-pay | Admitting: Cardiology

## 2017-05-31 ENCOUNTER — Ambulatory Visit (INDEPENDENT_AMBULATORY_CARE_PROVIDER_SITE_OTHER): Payer: PPO | Admitting: *Deleted

## 2017-05-31 DIAGNOSIS — I442 Atrioventricular block, complete: Secondary | ICD-10-CM

## 2017-05-31 LAB — CUP PACEART REMOTE DEVICE CHECK
Battery Impedance: 158 Ohm
Battery Remaining Longevity: 118 mo
Brady Statistic AP VP Percent: 0 %
Brady Statistic AS VS Percent: 9 %
Date Time Interrogation Session: 20181017203036
Implantable Lead Location: 753859
Implantable Lead Location: 753860
Implantable Lead Model: 5076
Implantable Pulse Generator Implant Date: 20160525
Lead Channel Impedance Value: 555 Ohm
Lead Channel Pacing Threshold Pulse Width: 0.4 ms
Lead Channel Setting Pacing Pulse Width: 0.4 ms
MDC IDC LEAD IMPLANT DT: 20160525
MDC IDC LEAD IMPLANT DT: 20160525
MDC IDC MSMT BATTERY VOLTAGE: 2.79 V
MDC IDC MSMT LEADCHNL RA IMPEDANCE VALUE: 486 Ohm
MDC IDC MSMT LEADCHNL RA PACING THRESHOLD AMPLITUDE: 0.375 V
MDC IDC MSMT LEADCHNL RV PACING THRESHOLD AMPLITUDE: 0.625 V
MDC IDC MSMT LEADCHNL RV PACING THRESHOLD PULSEWIDTH: 0.4 ms
MDC IDC SET LEADCHNL RA PACING AMPLITUDE: 1.5 V
MDC IDC SET LEADCHNL RV PACING AMPLITUDE: 2.5 V
MDC IDC SET LEADCHNL RV SENSING SENSITIVITY: 5.6 mV
MDC IDC STAT BRADY AP VS PERCENT: 0 %
MDC IDC STAT BRADY AS VP PERCENT: 91 %

## 2017-05-31 NOTE — Telephone Encounter (Signed)
LMOVM reminding pt to send remote transmission.   

## 2017-06-01 NOTE — Progress Notes (Signed)
Remote pacemaker transmission.   

## 2017-06-02 ENCOUNTER — Encounter: Payer: Self-pay | Admitting: Cardiology

## 2017-06-08 DIAGNOSIS — M7989 Other specified soft tissue disorders: Secondary | ICD-10-CM | POA: Diagnosis not present

## 2017-06-13 ENCOUNTER — Ambulatory Visit (INDEPENDENT_AMBULATORY_CARE_PROVIDER_SITE_OTHER): Payer: PPO | Admitting: Medical

## 2017-06-13 ENCOUNTER — Encounter: Payer: Self-pay | Admitting: Medical

## 2017-06-13 VITALS — BP 133/56 | HR 88 | Temp 97.5°F | Resp 16 | Ht 65.0 in | Wt 149.6 lb

## 2017-06-13 DIAGNOSIS — R21 Rash and other nonspecific skin eruption: Secondary | ICD-10-CM

## 2017-06-13 DIAGNOSIS — L989 Disorder of the skin and subcutaneous tissue, unspecified: Secondary | ICD-10-CM | POA: Diagnosis not present

## 2017-06-13 DIAGNOSIS — L089 Local infection of the skin and subcutaneous tissue, unspecified: Secondary | ICD-10-CM | POA: Diagnosis not present

## 2017-06-13 MED ORDER — CLINDAMYCIN HCL 150 MG PO CAPS: 150.0000 mg | ORAL_CAPSULE | Freq: Three times a day (TID) | ORAL | 0 refills | Status: DC

## 2017-06-13 MED ORDER — CLOTRIMAZOLE-BETAMETHASONE 1-0.05 % EX CREA
1.0000 | TOPICAL_CREAM | Freq: Two times a day (BID) | CUTANEOUS | 0 refills | Status: DC
Start: 2017-06-13 — End: 2017-10-10

## 2017-06-13 MED FILL — CLOTRIMAZOLE-BETAMETHASONE: 1-0.05 | 15 days supply | Qty: 30 | Fill #0

## 2017-06-13 MED FILL — CLINDAMYCIN HCL 150 MG CAPS: 150 | 7 days supply | Qty: 21 | Fill #0

## 2017-06-13 NOTE — Progress Notes (Signed)
   Subjective:    Patient ID: Jenna Marquez, female    DOB: 1945/10/25, 71 y.o.   MRN: [id number]  HPI  Pt in for rash on top of her foot. Patch of thick skin today. In past small area on top of foot but pt states rash has gotten worse.  Pt has been podiatrist in past and they did work up but pt got idea no definitive diagnosis given. Then pt saw vein specialist.  She indicates that she was never updated on the result.  I could not find results of vascular studies in epic.  Though I did see ultrasound results that reported no DVT.  Pt thinks patch rough area mild brownish using dc.  Patient indicates the large patchy area is relatively new.  Also in June when I saw her for cellulitis as she noted one small very tiny patch on top of her left foot that has now spread.  Indicates spreading just recently over the last 2-3 weeks.  Pt has concern for fungal infection.  Patient does note that the rash area does itch at times.   Review of Systems  Constitutional: Negative for chills, fatigue and fever.  Respiratory: Negative for cough, chest tightness, shortness of breath and wheezing.   Cardiovascular: Negative for chest pain and palpitations.  Gastrointestinal: Negative for abdominal pain.  Musculoskeletal:       No left lower extremity/calf pain.  Skin: Positive for rash.  Neurological: Negative for dizziness, seizures, speech difficulty, numbness and headaches.  Hematological: Negative for adenopathy. Does not bruise/bleed easily.  Psychiatric/Behavioral: Negative for behavioral problems and confusion.       Objective:   Physical Exam  General- No acute distress. Pleasant patient. Neck- Full range of motion, no jvd Lungs- Clear, even and unlabored. Heart- regular rate and rhythm. Neurologic- CNII- XII grossly intact.  Rt foot- top aspect. 3 cm x 1cm thick lichinified appearance but mild honey crusting to area.  No warmth presently.  Faint tenderness.      Assessment & Plan:    Your left foot thickened area has slight dry scaly yellowish tint to area concerning for bacterial  infection.  Also has some concern that he might have a component of fungal infection and inflammatory skin condition.  I want you to start clindamycin antibiotic and you get some form of probiotic in your system through yogurt for high probiotics over-the-counter.  Prescription of antibiotic will be for 7 days.  Note regarding choice of antibiotic she declined my idea of doxycycline.  I did want to provide coverage for potential MRSA.  Decided not to write Bactrim.  Starting this Friday can use Lotrisone to the rash area.  Let us know if the rash area is expanding.  We will go ahead and try to refer you to dermatologist.  I think you might eventually need a biopsy of this area to determine definitive diagnosis.  Follow-up in 7 days or as needed

## 2017-06-13 NOTE — Patient Instructions (Addendum)
Your left foot thickened area has slight dry scaly yellowish tint to area concerning for bacterial  infection.  Also has some concern that he might have a component of fungal infection and inflammatory skin condition.  I want you to start clindamycin antibiotic and you get some form of probiotic in your system through yogurt or probiotics over-the-counter.  Prescription of antibiotic will be for 7 days.  Starting this Friday can use Lotrisone to the rash area.  Let us know if the rash area is expanding.  We will go ahead and try to refer you to dermatologist.  I think you might eventually need a biopsy of this area to determine definitive diagnosis.  Follow-up in 7 days or as needed

## 2017-06-15 ENCOUNTER — Encounter: Payer: Self-pay | Admitting: Family Medicine

## 2017-06-16 ENCOUNTER — Telehealth: Payer: Self-pay | Admitting: Medical

## 2017-06-16 NOTE — Telephone Encounter (Signed)
Pt request new dermatology referral. Pt states dermatology we referred her to is in [place]. Please refer to one closer to [facility] or [place].

## 2017-06-16 NOTE — Telephone Encounter (Signed)
Referral faxed to [facility], awaiting appt

## 2017-06-18 ENCOUNTER — Emergency Department (HOSPITAL_BASED_OUTPATIENT_CLINIC_OR_DEPARTMENT_OTHER)
Admission: EM | Admit: 2017-06-18 | Discharge: 2017-06-18 | Disposition: A | Discharge status: Home / Self Care | Payer: PPO | Attending: Emergency Medicine | Admitting: Emergency Medicine

## 2017-06-18 ENCOUNTER — Emergency Department (HOSPITAL_BASED_OUTPATIENT_CLINIC_OR_DEPARTMENT_OTHER): Payer: PPO

## 2017-06-18 DIAGNOSIS — L270 Generalized skin eruption due to drugs and medicaments taken internally: Secondary | ICD-10-CM | POA: Diagnosis not present

## 2017-06-18 DIAGNOSIS — Z950 Presence of cardiac pacemaker: Secondary | ICD-10-CM | POA: Diagnosis not present

## 2017-06-18 DIAGNOSIS — M12562 Traumatic arthropathy, left knee: Secondary | ICD-10-CM | POA: Diagnosis not present

## 2017-06-18 DIAGNOSIS — M25462 Effusion, left knee: Secondary | ICD-10-CM | POA: Diagnosis not present

## 2017-06-18 DIAGNOSIS — R21 Rash and other nonspecific skin eruption: Secondary | ICD-10-CM | POA: Diagnosis not present

## 2017-06-18 DIAGNOSIS — J45909 Unspecified asthma, uncomplicated: Secondary | ICD-10-CM | POA: Insufficient documentation

## 2017-06-18 DIAGNOSIS — M1712 Unilateral primary osteoarthritis, left knee: Secondary | ICD-10-CM | POA: Diagnosis not present

## 2017-06-18 DIAGNOSIS — M25562 Pain in left knee: Secondary | ICD-10-CM | POA: Diagnosis not present

## 2017-06-18 MED ORDER — CETIRIZINE HCL 10 MG PO TABS: 10.0000 mg | ORAL_TABLET | Freq: Every evening | ORAL | Status: DC | PRN

## 2017-06-18 MED ORDER — DEXAMETHASONE SODIUM PHOSPHATE 10 MG/ML IJ SOLN
10.0000 mg | Freq: Once | INTRAMUSCULAR | Status: AC
  Administered 2017-06-18: 10 mg via INTRAMUSCULAR
  Filled 2017-06-18: qty 1

## 2017-06-18 MED ORDER — CETIRIZINE HCL 5 MG/5ML PO SOLN
10.0000 mg | Freq: Once | ORAL | Status: AC
  Administered 2017-06-18: 10 mg via ORAL
  Filled 2017-06-18: qty 10

## 2017-06-18 NOTE — ED Notes (Signed)
Pt arrived during downtime. See downtime form for triage and assessment.

## 2017-06-18 NOTE — ED Notes (Signed)
Pt assisted to BR in wheelchair, and back to bed by RN.

## 2017-06-18 NOTE — ED Provider Notes (Signed)
See Epic downtime documentation.    Jenna Marquez, Jenna Reichmann, MD 06/18/17 [phone]

## 2017-06-19 ENCOUNTER — Other Ambulatory Visit: Payer: Self-pay

## 2017-06-19 NOTE — Patient Outreach (Signed)
Outreach patient after ED visit at [facility] on 06/18/17.  I spoke with patient and verified PCP is current.  She is in the process of scheduling a follow up appointment with PCP.  I explained 24 hour Nurse Advice Line and [facility] services.  She made note of the number and I explained that I would mail the information to her as well.  She also made note of my number and would call me back if she needed [facility] services.

## 2017-06-22 ENCOUNTER — Other Ambulatory Visit: Payer: Self-pay | Admitting: *Deleted

## 2017-06-22 ENCOUNTER — Telehealth: Payer: Self-pay | Admitting: Family Medicine

## 2017-06-22 ENCOUNTER — Ambulatory Visit (HOSPITAL_BASED_OUTPATIENT_CLINIC_OR_DEPARTMENT_OTHER)
Admission: RE | Admit: 2017-06-22 | Discharge: 2017-06-22 | Disposition: A | Discharge status: Home / Self Care | Payer: PPO | Source: Ambulatory Visit | Attending: Family Medicine | Admitting: Family Medicine

## 2017-06-22 DIAGNOSIS — Z1231 Encounter for screening mammogram for malignant neoplasm of breast: Secondary | ICD-10-CM | POA: Diagnosis not present

## 2017-06-22 NOTE — Patient Outreach (Signed)
[facility]) Care Management  06/22/2017  Jenna Marquez 1946-01-08 [id number]   Telephone Screen  Referral Date: 06/21/2017 Referral Source: 24 hour Nurse Line Insurance: [employer]   Outreach Attempt: Successful outreach to patient to follow up on patient's 24 hour Nurse [facility] on 06/20/2017.  HIPAA confirmed with patient.  Patient states she was recently treated for left foot infection with Clindamycin prescribed by a provider at her primary care's office.  She states she developed rash on her arms and legs and blisters on her left foot over the weekend while taking the clindamycin.  Patient went to the emergency room where she was directed to discontinue the clindamycin and was given an injection of dexamethasone.  She states she was also instructed to take zyrtec by the ED physician.  Patient states she continues to have rash on arms and legs, has taken zyrtec on Sunday night and Wednesday night with little relief of itching.  Patient states the rash to her left foot is mostly cleared with the use of Lotrisone cream prescribed by her primary.  She states the blisters are still there and know notices a wet spot on her sock she had just removed.  Patient has an appointment with the primary care provider whom she seen last week, on Monday, November 12.  Patient stated she was using neosporin on the blisters. Patient instructed she could take benadryl to help with the itching.  Discussed fall precautions with patient and notified her benadryl may make her drowsy.  Also instructed patient to not use neosporin on blisters, to keep open blisters clean and to cover with dry dressing.  Patient directed to call her primary care provider for guidance and notification of open blisters.    RN Health Coach explained [facility] services to patient and offered the screening process.  Patient declined to complete the screening process.    Consent: Patient currently declines the screening  process and [facility] services.  Plan:   RN Health Coach encouraged the use of the 24 hour nurse line. RN Health Coach encouraged patient to contact her primary care provider for guidance. RN Health Coach will notify CMA of case closure status.   [place] [phone] [email or web address]

## 2017-06-22 NOTE — Telephone Encounter (Signed)
Called pt and she gave me a detailed (8 minute) account of her recent sx. Advised her that I really cannot diagnose a derm condition over the phone. She feels that her dark skin area is better, but she now has some blisters on her feet. She is otherwise feeling ok She will see [place] on Monday- she will seek care sooner if not doing ok

## 2017-06-22 NOTE — Telephone Encounter (Signed)
Relation to UD:JSHF Call back number:[phone]   Reason for call:  Patient last saw Percell Miller 06/13/17 for for rash on top of her foot. Patch of thick skin and small area on top of foot but pt states rash has gotten worse. Patient states she has no transportation and declined appointment stating she has an appointment with Percell Miller on 06/26/17 but would like to speak with her PCP today, please advise

## 2017-06-23 DIAGNOSIS — R21 Rash and other nonspecific skin eruption: Secondary | ICD-10-CM | POA: Diagnosis not present

## 2017-06-23 DIAGNOSIS — M12562 Traumatic arthropathy, left knee: Secondary | ICD-10-CM | POA: Diagnosis not present

## 2017-06-26 ENCOUNTER — Encounter: Payer: Self-pay | Admitting: Medical

## 2017-06-26 ENCOUNTER — Ambulatory Visit: Payer: PPO | Admitting: Medical

## 2017-06-26 VITALS — BP 136/65 | HR 93 | Temp 98.1°F | Resp 16 | Ht 65.0 in | Wt 149.4 lb

## 2017-06-26 DIAGNOSIS — L299 Pruritus, unspecified: Secondary | ICD-10-CM

## 2017-06-26 DIAGNOSIS — R238 Other skin changes: Secondary | ICD-10-CM

## 2017-06-26 DIAGNOSIS — T7840XA Allergy, unspecified, initial encounter: Secondary | ICD-10-CM

## 2017-06-26 MED ORDER — PREDNISONE 10 MG PO TABS: ORAL_TABLET | ORAL | 0 refills | Status: DC

## 2017-06-26 MED ORDER — SILVER SULFADIAZINE 1 % EX CREA: 1.0000 "application " | TOPICAL_CREAM | Freq: Every day | CUTANEOUS | 1 refills | Status: DC

## 2017-06-26 MED ORDER — HYDROXYZINE HCL 10 MG PO TABS: ORAL_TABLET | ORAL | 0 refills | Status: DC

## 2017-06-26 MED FILL — SILVADENE 1% CREAM: 1 | 30 days supply | Qty: 50 | Fill #0

## 2017-06-26 MED FILL — hydrOXYzine HCL 10 MG TABS: 10 | 7 days supply | Qty: 7 | Fill #0

## 2017-06-26 MED FILL — predniSONE 10 MG TABS: 10 | 4 days supply | Qty: 10 | Fill #0

## 2017-06-26 NOTE — Patient Instructions (Addendum)
For recent allergic reaction use prednisone taper dose. For itching use hydroxyzine at night.   Stop zyrtec while using hydroxyzine.  For open blister lesions use silvadene. I think you can stop doxycycline presently.  If area not improving will try to get your dermatologist appointment moved up.  Follow up in one week or as needed

## 2017-06-26 NOTE — Progress Notes (Signed)
Subjective:    Patient ID: Jenna Marquez, female    DOB: 1946/05/15, 71 y.o.   MRN: [id number]  HPI  Pt in for evaluation.  Pt states she had itching with clindamycin(see my prior note). She had outbreak to her upper back and some mild raised area on her buttock region that itched a lot. At same time she had outbreak of blister lesions to her left foot. Small blister base of left great toe and large blister to mid foot(top aspect). Some small blistering lateral ankle.  When pt stopped clindamycin and restarted old rx of doxycycline antibiotic.  Pt does note very dark area (lichenification region) did get better with lotrisone.  Pt does not some blistering/tiny size even before starting clindamycin.  Review of Systems  Constitutional: Negative for chills, fatigue and fever.  Respiratory: Negative for cough, chest tightness, shortness of breath and wheezing.   Cardiovascular: Negative for chest pain and palpitations.  Gastrointestinal: Negative for abdominal pain.  Musculoskeletal: Negative for arthralgias, back pain, neck pain and neck stiffness.  Skin: Positive for rash.  Neurological: Negative for dizziness, seizures, syncope, weakness and light-headedness.  Hematological: Negative for adenopathy. Does not bruise/bleed easily.  Psychiatric/Behavioral: Negative for behavioral problems and confusion. The patient is not nervous/anxious.    Past Medical History:  Diagnosis Date  . Allergic rhinitis   . Anxiety    "occasionally"  . Asthma   . COPD (chronic obstructive pulmonary disease) ([place])   . GERD (gastroesophageal reflux disease)   . Mobitz type 2 second degree AV block 01/06/2015     Social History   Socioeconomic History  . Marital status: Widowed    Spouse name: Not on file  . Number of children: 1  . Years of education: Not on file  . Highest education level: Not on file  Social Needs  . Financial resource strain: Not on file  . Food insecurity - worry: Not on  file  . Food insecurity - inability: Not on file  . Transportation needs - medical: Not on file  . Transportation needs - non-medical: Not on file  Occupational History  . 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 Smoking status: Former Smoker    Packs/day: 1.00    Years: 35.00    Pack years: 35.00    Types: Cigarettes    Last attempt to quit: 08/10/2012    Years since quitting: 4.8  . Smokeless tobacco: Never Used  . Tobacco comment: Started at age 72  Substance and Sexual Activity  . Alcohol use: Yes    Alcohol/week: 16.8 oz    Types: 28 Shots of liquor per week    Comment: 01/06/2015 "2 shot mixed drinks, 2/day"  . Drug use: No  . Sexual activity: No    Birth control/protection: Post-menopausal  Other Topics Concern  . Not on file  Social History Narrative   Lost husband in 12-2011, she lost her sister in 2012, brother in July 2013. She lives by herself, God son stayed with her. She retired as Radio producer.    Past Surgical History:  Procedure Laterality Date  . [place]  . PACEMAKER INSERTION    . TONSILLECTOMY AND ADENOIDECTOMY  1954    Family History  Problem Relation Age of Onset  . Coronary artery disease Mother   . Heart disease Mother        CHF  . Diabetes Neg Hx   .  Cancer Neg Hx   . Stroke Neg Hx   . Colon cancer Neg Hx     Allergies  Allergen Reactions  . Avelox [Moxifloxacin Hcl In Nacl] Itching and Rash    Current Outpatient Medications on File Prior to Visit  Medication Sig Dispense Refill  . albuterol (PROVENTIL HFA;VENTOLIN HFA) 108 (90 Base) MCG/ACT inhaler Inhale 2 puffs into the lungs every 6 (six) hours as needed for wheezing or shortness of breath. 1 Inhaler 2  . albuterol (PROVENTIL) (2.5 MG/3ML) 0.083% nebulizer solution Take 3 mLs (2.5 mg total) by nebulization every 6 (six) hours as needed for wheezing or shortness of breath. 150 mL 0  . ALPRAZolam (XANAX)  0.5 MG tablet Take 1 tablet (0.5 mg total) by mouth at bedtime as needed for sleep. Do not take with alcohol 30 tablet 0  . aspirin EC 81 MG EC tablet Take 1 tablet (81 mg total) by mouth daily. 30 tablet 0  . benzonatate (TESSALON) 100 MG capsule Take 1 capsule (100 mg total) by mouth every 8 (eight) hours. 21 capsule 0  . budesonide-formoterol (SYMBICORT) 160-4.5 MCG/ACT inhaler Inhale 2 puffs into the lungs 2 (two) times daily. 3 Inhaler 1  . calcium-vitamin D (OSCAL WITH D) 250-125 MG-UNIT tablet Take 1 tablet by mouth daily.    . cetirizine (ZYRTEC) 10 MG tablet Take 1 tablet (10 mg total) at bedtime as needed by mouth (for itching).    . Cholecalciferol (VITAMIN D) 2000 UNITS CAPS Take 1 capsule by mouth daily.    . clotrimazole-betamethasone (LOTRISONE) cream Apply 1 application topically 2 (two) times daily. 30 g 0  . diltiazem (CARDIZEM CD) 180 MG 24 hr capsule Take 1 capsule (180 mg total) by mouth daily. 90 capsule 3  . furosemide (LASIX) 20 MG tablet Use 1 daily as needed for occasional foot and ankle swelling 30 tablet 3  . ibuprofen (ADVIL,MOTRIN) 800 MG tablet Take 1 tablet (800 mg total) by mouth 3 (three) times daily. 21 tablet 0  . ipratropium (ATROVENT) 0.02 % nebulizer solution Take 2.5 mLs (0.5 mg total) by nebulization every 6 (six) hours as needed for wheezing or shortness of breath. 75 mL 0  . ipratropium (ATROVENT) 0.02 % nebulizer solution INHALE 2.5 MLS (0.5 MG TOTAL) INTO THE LUNGS 4 (FOUR) TIMES DAILY. DX J44.9 350 mL 0  . Multiple Vitamin (MULTIVITAMIN) tablet Take 2 tablets by mouth every morning.      No current facility-administered medications on file prior to visit.     BP 136/65   Pulse 93   Temp 98.1 F (36.7 C) (Oral)   Resp 16   Ht 5\' 5"  (1.651 m)   Wt 149 lb 6.4 oz (67.8 kg)   SpO2 97%   BMI 24.86 kg/m       Objective:   Physical Exam  General- No acute distress. Pleasant patient. Neck- Full range of motion, no jvd Lungs- Clear, even and  unlabored. Heart- regular rate and rhythm. Neurologic- CNII- XII grossly intact.  Skin- small blister base of left great toe. Moderate sized blister 2.5 cm x 2.0 cm. Small scattered blisters/vesicles left ankle latera aspect.   Prior mid foot lichenified area looks a lot better.  Upper back and thigh scattered papules.      Assessment & Plan:  For recent allergic reaction use prednisone taper dose. For itching use hydroxyzine at night.   Stop zyrtec while using hydroxyzine.(rx advisement given)  For open blister lesions use silvadene. I think you  can stop doxycycline presently.  If area not improving will try to get your dermatologist appointment moved up.  Follow up in one week or as needed  Karlisa Gaubert, Percell Miller, [employer]

## 2017-06-27 ENCOUNTER — Other Ambulatory Visit: Payer: Self-pay | Admitting: Pulmonary Disease

## 2017-07-03 ENCOUNTER — Ambulatory Visit (HOSPITAL_BASED_OUTPATIENT_CLINIC_OR_DEPARTMENT_OTHER)
Admission: RE | Admit: 2017-07-03 | Discharge: 2017-07-03 | Disposition: A | Discharge status: Home / Self Care | Payer: PPO | Source: Ambulatory Visit | Attending: Medical | Admitting: Medical

## 2017-07-03 ENCOUNTER — Ambulatory Visit: Payer: PPO | Admitting: Medical

## 2017-07-03 ENCOUNTER — Telehealth: Payer: Self-pay | Admitting: Medical

## 2017-07-03 ENCOUNTER — Encounter: Payer: Self-pay | Admitting: Medical

## 2017-07-03 VITALS — BP 152/73 | HR 99 | Temp 97.6°F | Resp 16 | Ht 65.0 in | Wt 149.4 lb

## 2017-07-03 DIAGNOSIS — M79605 Pain in left leg: Secondary | ICD-10-CM | POA: Diagnosis not present

## 2017-07-03 DIAGNOSIS — R9389 Abnormal findings on diagnostic imaging of other specified body structures: Secondary | ICD-10-CM | POA: Diagnosis not present

## 2017-07-03 DIAGNOSIS — M25562 Pain in left knee: Secondary | ICD-10-CM | POA: Diagnosis not present

## 2017-07-03 DIAGNOSIS — L989 Disorder of the skin and subcutaneous tissue, unspecified: Secondary | ICD-10-CM | POA: Diagnosis not present

## 2017-07-03 LAB — CBC WITH DIFFERENTIAL/PLATELET
BASOS PCT: 1.2 % (ref 0.0–3.0)
Basophils Absolute: 0.1 10*3/uL (ref 0.0–0.1)
EOS ABS: 0.2 10*3/uL (ref 0.0–0.7)
EOS PCT: 2.4 % (ref 0.0–5.0)
HEMATOCRIT: 40 % (ref 36.0–46.0)
HEMOGLOBIN: 13.4 g/dL (ref 12.0–15.0)
LYMPHS PCT: 21.4 % (ref 12.0–46.0)
Lymphs Abs: 1.5 10*3/uL (ref 0.7–4.0)
MCHC: 33.4 g/dL (ref 30.0–36.0)
MONOS PCT: 13.5 % — AB (ref 3.0–12.0)
Monocytes Absolute: 0.9 10*3/uL (ref 0.1–1.0)
NEUTROS ABS: 4.3 10*3/uL (ref 1.4–7.7)
Neutrophils Relative %: 61.5 % (ref 43.0–77.0)
PLATELETS: 298 10*3/uL (ref 150.0–400.0)
RBC: 3.6 Mil/uL — ABNORMAL LOW (ref 3.87–5.11)
RDW: 14.4 % (ref 11.5–15.5)
WBC: 7 10*3/uL (ref 4.0–10.5)

## 2017-07-03 LAB — URIC ACID: Uric Acid, Serum: 9.4 mg/dL — ABNORMAL HIGH (ref 2.4–7.0)

## 2017-07-03 MED ORDER — PREDNISONE 10 MG PO TABS: ORAL_TABLET | ORAL | 0 refills | Status: DC

## 2017-07-03 NOTE — Telephone Encounter (Signed)
Signed the 6-day taper dose of prednisone.  It printed instead of being faxed.  Will you please send the script to the pharmacy. See result note as well. Call pt  Tuesday/tomorrow please.

## 2017-07-03 NOTE — Patient Instructions (Addendum)
For your left knee pain, swelling and popliteal pain will get lower ext  [place] today at 4:30. We need to make sure no dVT present. Also will get cbc and uric acid to evaluate infection cells and uric acid.  We will get these results then determine if change in treatment needed. Can use 2 tabs of hyroxyzine for itching if needed. Caution on side effects.  Notified my referral staff on attempt to refer you to derm quicker.  Follow up date to be determined after lab and [place] review.

## 2017-07-03 NOTE — Progress Notes (Signed)
Subjective:    Patient ID: Jenna Marquez, female    DOB: 1946/01/27, 71 y.o.   MRN: [id number]  HPI   Pt in with scattered diffuse blisters/vesicles on her left foot. I had written hydroxyzine for itching and I had written pt for silvadene for any blisters that ruptured.  She did finish the taper dose of prednisone.  Pt also had knee injury 2 months ago. She fell but no pain. But then just recently this weekend her left knee started to hurt and swell. He knee is tender to touch and has pain on movement. Some warmth per her report. 06-18-2017 xray did not show fracture but degenerative changes.     Review of Systems  Constitutional: Negative for chills, fatigue and fever.  HENT: Negative for congestion, ear pain, postnasal drip, sinus pressure and sinus pain.   Respiratory: Negative for cough, chest tightness, shortness of breath and wheezing.   Cardiovascular: Negative for chest pain and palpitations.  Gastrointestinal: Negative for abdominal pain and nausea.  Endocrine: Negative for polydipsia, polyphagia and polyuria.  Musculoskeletal: Negative for back pain, neck pain and neck stiffness.  Skin: Positive for rash.       See hpi. And physicial  Neurological: Negative for dizziness, speech difficulty, weakness and headaches.  Hematological: Negative for adenopathy. Does not bruise/bleed easily.  Psychiatric/Behavioral: Negative for behavioral problems, confusion, hallucinations and suicidal ideas. The patient is not nervous/anxious.    Past Medical History:  Diagnosis Date  . Allergic rhinitis   . Anxiety    "occasionally"  . Asthma   . COPD (chronic obstructive pulmonary disease) ([place])   . GERD (gastroesophageal reflux disease)   . Mobitz type 2 second degree AV block 01/06/2015     Social History   Socioeconomic History  . Marital status: Widowed    Spouse name: Not on file  . Number of children: 1  . Years of education: Not on file  . Highest education level: Not on  file  Social Needs  . Financial resource strain: Not on file  . Food insecurity - worry: Not on file  . Food insecurity - inability: Not on file  . Transportation needs - medical: Not on file  . Transportation needs - non-medical: Not on file  Occupational History  . 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 Smoking status: Former Smoker    Packs/day: 1.00    Years: 35.00    Pack years: 35.00    Types: Cigarettes    Last attempt to quit: 08/10/2012    Years since quitting: 4.8  . Smokeless tobacco: Never Used  . Tobacco comment: Started at age 25  Substance and Sexual Activity  . Alcohol use: Yes    Alcohol/week: 16.8 oz    Types: 28 Shots of liquor per week    Comment: 01/06/2015 "2 shot mixed drinks, 2/day"  . Drug use: No  . Sexual activity: No    Birth control/protection: Post-menopausal  Other Topics Concern  . Not on file  Social History Narrative   Lost husband in 12-2011, she lost her sister in 2012, brother in July 2013. She lives by herself, God son stayed with her. She retired as Radio producer.    Past Surgical History:  Procedure Laterality Date  . [place]  . Pacemaker Implant N/A 01/07/2015   Performed by Evans Lance, MD at [facility]  . PACEMAKER INSERTION    .  TONSILLECTOMY AND ADENOIDECTOMY  1954    Family History  Problem Relation Age of Onset  . Coronary artery disease Mother   . Heart disease Mother        CHF  . Diabetes Neg Hx   . Cancer Neg Hx   . Stroke Neg Hx   . Colon cancer Neg Hx     Allergies  Allergen Reactions  . Avelox [Moxifloxacin Hcl In Nacl] Itching and Rash    Current Outpatient Medications on File Prior to Visit  Medication Sig Dispense Refill  . albuterol (PROVENTIL HFA;VENTOLIN HFA) 108 (90 Base) MCG/ACT inhaler Inhale 2 puffs into the lungs every 6 (six) hours as needed for wheezing or shortness of breath. 1 Inhaler 2  . albuterol  (PROVENTIL) (2.5 MG/3ML) 0.083% nebulizer solution Take 3 mLs (2.5 mg total) by nebulization every 6 (six) hours as needed for wheezing or shortness of breath. 150 mL 0  . albuterol (PROVENTIL) (2.5 MG/3ML) 0.083% nebulizer solution PUFF AND INHALE 4 TIMES DAILY**SUBTRACT 1 BOX ON NEXT REFILL, GAVE 1 BOX ON 7/11** 375 mL 1  . ALPRAZolam (XANAX) 0.5 MG tablet Take 1 tablet (0.5 mg total) by mouth at bedtime as needed for sleep. Do not take with alcohol 30 tablet 0  . aspirin EC 81 MG EC tablet Take 1 tablet (81 mg total) by mouth daily. 30 tablet 0  . benzonatate (TESSALON) 100 MG capsule Take 1 capsule (100 mg total) by mouth every 8 (eight) hours. 21 capsule 0  . budesonide-formoterol (SYMBICORT) 160-4.5 MCG/ACT inhaler Inhale 2 puffs into the lungs 2 (two) times daily. 3 Inhaler 1  . calcium-vitamin D (OSCAL WITH D) 250-125 MG-UNIT tablet Take 1 tablet by mouth daily.    . cetirizine (ZYRTEC) 10 MG tablet Take 1 tablet (10 mg total) at bedtime as needed by mouth (for itching).    . Cholecalciferol (VITAMIN D) 2000 UNITS CAPS Take 1 capsule by mouth daily.    . clotrimazole-betamethasone (LOTRISONE) cream Apply 1 application topically 2 (two) times daily. 30 g 0  . diltiazem (CARDIZEM CD) 180 MG 24 hr capsule Take 1 capsule (180 mg total) by mouth daily. 90 capsule 3  . furosemide (LASIX) 20 MG tablet Use 1 daily as needed for occasional foot and ankle swelling 30 tablet 3  . hydrOXYzine (ATARAX/VISTARIL) 10 MG tablet 1 tab po q hs as needed itching 7 tablet 0  . ibuprofen (ADVIL,MOTRIN) 800 MG tablet Take 1 tablet (800 mg total) by mouth 3 (three) times daily. 21 tablet 0  . ipratropium (ATROVENT) 0.02 % nebulizer solution Take 2.5 mLs (0.5 mg total) by nebulization every 6 (six) hours as needed for wheezing or shortness of breath. 75 mL 0  . ipratropium (ATROVENT) 0.02 % nebulizer solution INHALE 2.5 MLS (0.5 MG TOTAL) INTO THE LUNGS 4 (FOUR) TIMES DAILY. DX J44.9 375 mL 0  . Multiple Vitamin  (MULTIVITAMIN) tablet Take 2 tablets by mouth every morning.     . predniSONE (DELTASONE) 10 MG tablet 4 tab po day 1, 3 tab po day 2, 2 tab po day 3, and 1 tab po day 4. 10 tablet 0  . silver sulfADIAZINE (SILVADENE) 1 % cream Apply 1 application daily topically. 30 g 1   No current facility-administered medications on file prior to visit.     BP (!) 152/73   Pulse 99   Temp 97.6 F (36.4 C) (Oral)   Resp 16   Ht 5\' 5"  (1.651 m)   Wt 149  lb 6.4 oz (67.8 kg)   SpO2 97%   BMI 24.86 kg/m       Objective:   Physical Exam  General- No acute distress. Pleasant patient. Neck- Full range of motion, no jvd Lungs- Clear, even and unlabored. Heart- regular rate and rhythm. Neurologic- CNII- XII grossly intact.  Left knee- swollen and tender to palpatoin. Left leg- popliteal fossae is tender. Upper calf is mild swollen.  Skin- small blister base of left great toe. Moderate sized blister 2.5 cm x 2.0 cm. Small scattered blisters/vesicles left ankle latera aspect.   Left forearm-scattered papules.Some on rt forearm as well.  Prior mid foot lichenified area looks a lot better.  Upper back and thigh scattered papules.      Assessment & Plan:  For your left knee pain, swelling and popliteal pain will get lower ext  [place] today at 4:30. We need to make sure no dVT present. Also will get cbc and uric acid to evaluate infection cells and uric acid.  We will get these results then determine if change in treatment needed. Can use 2 tabs of hyroxyzine for itching if needed. Caution on side effects.  Notified my referral staff on attempt to refer you to derm quicker.  Follow up date to be determined after lab and [place] review.  Lindee Leason, Percell Miller, PA-C

## 2017-07-04 ENCOUNTER — Other Ambulatory Visit: Payer: Self-pay | Admitting: Family Medicine

## 2017-07-04 ENCOUNTER — Other Ambulatory Visit: Payer: Self-pay | Admitting: *Deleted

## 2017-07-04 DIAGNOSIS — M7122 Synovial cyst of popliteal space [Baker], left knee: Secondary | ICD-10-CM

## 2017-07-04 MED ORDER — PREDNISONE 10 MG PO TABS: ORAL_TABLET | ORAL | 0 refills | Status: DC

## 2017-07-04 MED ORDER — ALPRAZOLAM 0.5 MG PO TABS: 0.5000 mg | ORAL_TABLET | Freq: Three times a day (TID) | ORAL | 0 refills | Status: DC | PRN

## 2017-07-04 NOTE — Telephone Encounter (Signed)
She has scattered small papular lesion on her body with some scattered large bullous/blister lesion to left foot and scattered vesicles. Recent new eruption mid chest. Pt has been itching a lot. I have written her hydroxyzine and prednisone. Minimal improvement. Early on blister appeared early October when I treated her with clindamycin for area of skin infection. Since then blisters persist.  Recently on follow up new complaint of left knee pain and popliteal pain. Her uric acid showed increase and  US showed complex cyst/baker cyst.   I had already referred her to dermatologist and appointment was delayed. Now in light of patient new lesion on chest we got her appointment moved up until Monday.   In light of gout I restarted her on prednisone. Gave rx of xanax to help with her extreme stress dealing with her combination of health issues.   Overall not sure if she has bullous pymphegoid or allergic reaction? I don't think shingles(new mid line blister upper chest) and overall presentation doubt steven johnson type syndrome. Last I saw her no skin peeling. No report of any mouth lesion. I am hoping to make symptoms tolerable until she can see derm and get biopsy.   Thanks for your offer to help. Any recommendation. She is very nice but diagnosis and treatment difficult.

## 2017-07-04 NOTE — Telephone Encounter (Signed)
Rx faxed to pharmacy  

## 2017-07-04 NOTE — Telephone Encounter (Signed)
Thanks sharon- you went above and beyond [place] let me know if I can be of any assitance

## 2017-07-04 NOTE — Telephone Encounter (Signed)
Thanks for speaking with her and encouraging her Ivin Booty! Percell Miller- I have not seen her since August.  She did have a skin issue that I looked at in June.  I am not really clear on what is going on with her legs/ rash at this time.  I think she is seeing you tomorrow afternoon.  Can I help in some way?  Ivin Booty when you call he back please make sure that she was able to get her prednisone- thank you

## 2017-07-04 NOTE — Telephone Encounter (Signed)
Called patient back to inform her that Percell Miller had written her a new Rx for Alprazolam 0.5mg  tablet with #15 since she has previously taken this med; pt asked what this was for and I told her Anxiety, she replied that she did not need anything for anxiety and she refused to take medication. I spent [58 minutes on phone with patient. We talked about how important it is that we look at the whole person and all related health information [diagnosis, medications, etc]. We discussed how things change as we get older with our bodies, and how this also affects the need to change medications that we possibly may have been taking for numerous years, even when we feel like it's the only thing that works and we don't want to have it changed. I explained that medications like Alprazolam are px for different reasons for patients, such as anxiety, but also sleep and pain management. We again talked about how having peace in our minds can help it relate to bodies as well and what may be going on with them. We talked awhile about her husband and the loss thereof; that she believes was related to a medical error, and we talked about her niece who is taking medical classes. Patient in the end agreed that she would try my instructions: she will take only half of the Ibuprofen [400 mg total] in the morning when she gets up and then she will take an Alprazolam and start the prednisone. She will give them time to start working; and she will then call me late tomorrow morning to discuss how the morning was. She has a tentative appt scheduled with Percell Miller tomorrow afternoon for pain mngt and new lesions, which will depend transportation availability. She has the Monday appt with Dermatology in the Am, but [facility] no longer has a [place] office, so I have made her an appointment with Dr. Barbaraann Barthel on Monday afternoon at 2:45pm [which we discussed during our conversation, that I would check on available appointments]. Patient is  anxious and frustrated but no longer seems to have any idea of wishing harm on herself. I called the pharmacy personally and gave them the Prednisone order, again, patient agreed [promised] that she would try cutting the Ibuprofen 800 mg in half tomorrow morning, then take the Alprazolam and start the Prednisone. She has my contact information and knows to seek A&E at and UC/ED if needed outside of our business hours/SLS 11/20

## 2017-07-04 NOTE — Telephone Encounter (Signed)
Patient was called to give her tests results by provider's MA, and patient was crying and stating that "she should just kill herself"; I was called to the phone to assist. I spoke with patient about her medical conditions to get familiar while reading from chart, she was frustrated because she isn't getting needed relief from Rx and she was given an appt date for referral to Dermatology that for next month and she can't get refill on her Ibuprofen right now [to take prednisone this week, CKD and Age]. Patient is struggling with her CKD, COPD with Asthma and now has this blistering rash with swelling on her foot and lower leg [possible adverse reaction to something in new compression stocking she put on leg]. Patient reports that she woke up today with a new lesion on her chest, which is causing great deal of hypersensitivity.  Pt feels extremely frustrated and "just wants to give up". I empathized with her feelings and encouraged her to keep the faith and continue to use her px medication, keep a close check on rash and be aware of any changes [especially signs ofalso to use whatever gives her peace [her faith] and use that right now to give her the ability of clarity in her time of pain & frustration]; as not to cause exacerbation of new issues. She agreed to try and relax her mind and body with reading her Bible while I would work on our end of the situation to try and get better solutions on referrals and medication [placed Ortho referral, as stated in Imaging result note]. I spoke with [facility], [facility], and informed her of the situation to see if we can push for an appointment anywhere that takes her Insurance and can get her in soon with Dermatology; she understood & agreed, and was made aware of the Ortho referral I would be placing. I have belief that patient will seek a peaceful setting at this time, but if unable to get her in sooner with referrals and receive some type of pain relief for Baker's Cyst, blistered  rash, and possibly new Gout Dx, I believe patient may spiral back down. I would appreciate any assistance and/or advisement on how we could assist pt with pain relief, as I will be calling her back today as promised. I also feel like her hypersensitivity would benefit from human contact & reassessment, so I have placed her on PCP schedule for tomorrow at 11:30 tentatively. Please Advise/SLS 1/20  Patient informed, understood & agreed to Ortho referral/SLS 11/20

## 2017-07-05 ENCOUNTER — Ambulatory Visit: Payer: PPO | Admitting: Family Medicine

## 2017-07-05 ENCOUNTER — Telehealth: Payer: Self-pay | Admitting: Medical

## 2017-07-05 ENCOUNTER — Telehealth: Payer: Self-pay

## 2017-07-05 ENCOUNTER — Encounter: Payer: Self-pay | Admitting: Medical

## 2017-07-05 ENCOUNTER — Ambulatory Visit: Payer: PPO | Admitting: Medical

## 2017-07-05 ENCOUNTER — Telehealth: Payer: Self-pay | Admitting: *Deleted

## 2017-07-05 VITALS — BP 138/60 | HR 102 | Temp 97.8°F | Resp 14 | Ht 65.0 in | Wt 149.0 lb

## 2017-07-05 DIAGNOSIS — L989 Disorder of the skin and subcutaneous tissue, unspecified: Secondary | ICD-10-CM | POA: Diagnosis not present

## 2017-07-05 DIAGNOSIS — L509 Urticaria, unspecified: Secondary | ICD-10-CM | POA: Diagnosis not present

## 2017-07-05 DIAGNOSIS — R21 Rash and other nonspecific skin eruption: Secondary | ICD-10-CM | POA: Diagnosis not present

## 2017-07-05 MED ORDER — HYDROXYZINE HCL 10 MG PO TABS: ORAL_TABLET | ORAL | 0 refills | Status: DC

## 2017-07-05 MED ORDER — HYDROXYZINE HCL 25 MG PO TABS: 25.0000 mg | ORAL_TABLET | Freq: Three times a day (TID) | ORAL | 0 refills | Status: DC | PRN

## 2017-07-05 NOTE — Telephone Encounter (Signed)
Opened in error, see 07/04/17 refill note with extensive notes/SLS 11/21

## 2017-07-05 NOTE — Patient Instructions (Signed)
The cause of your skin rash and lesion are not yet determined you may have allergic reaction or bullous pemphigoid. There are other possibilities and you may need further work up and/or biopsy of the lesions. I want you to continue the taper prednisone and I wrote for higher dose hydroxyzine 1 tab po q hs as needed itching. If you have severe itching despite one tab and you wake up middle of the night you could take 2 tab but extreme caution on side effect sedation. Attend dermatologist appointment on Monday.  In the event you have peeling of skin, mouth lesion or severe worsening rash over thanskgiving then ED evaluation.  Referral to Dr. Felipe Drone to evaluate and give opinion on sports med. Pt preferred sport med referral instead of ortho referral.  Follow up with [place] as need post dermatologist appointment.

## 2017-07-05 NOTE — Telephone Encounter (Addendum)
Spoke with patient via telephone after receiving [employer] notice that patient called in the early Am hours about her Itching. Patient was given different instructions on her medication plan from physician that we discussed yesterday. Patient reports that she doesn't have a ride so she will just come in to office on Monday; informed her that we needed her in the office today for A&E, since she had reported new skin lesions and now has some open blisters. Informed patient that we would be too concerned for her to go throughout the Thanksgiving Holiday and weekend without being seen by provider to make sure that we have a wound care and pain assistance plan in place that she will need to follow while we are out of the office. Patient stated that she wasn't sure if her son would be back in time to bring her to her 2:15pm appointment. I asked her to get in touch with him and that I would work to find a back-up solution if needed. While waiting on patient to call back, we would try to see if patient could receive taxi voucher from ED by provider request, if all other routes had been tried. Pt called back to let us know that she would be in for her appointment but it would be at 2:30pm; we were able to move our 2:45pm patient to 2:15pm and Ms. Willems to 2:30p, still in 30-min slot/SLS 11/21

## 2017-07-05 NOTE — Telephone Encounter (Signed)
Will you call pt around 1 pm and see how she is. If she is coming?

## 2017-07-05 NOTE — Progress Notes (Signed)
Subjective:    Patient ID: Jenna Marquez, female    DOB: [birth year], 71 y.o.   MRN: [id number]  HPI  New lesion on her upper chest since last visit(see phone notes of Ivin Booty). Pcp aware of patient complaints and I was aware. I wanted to see her back to make sure doing ok before Thanksgiving holiday/   No new blisters on her left foot.( mouth lesions, no skin peeling and no vulvar lesions).  Pt clarifies hydroxyzine dose help stop itching at night but then will wake up again with itching. She was only taking 10 mg dose. She states she has high tolerance for taking meds. Nothing really makes her sleepy.  She clarified her the other day that she would  be better off dead the other day. She states other day extremely frustrated about her lack of sleep. She admits she was over exaggerating. She does not feel like this today  Pt states zyrtec worked better than low dose hydroxyxizine.  Pt rt knee not as swollen and popliteal area feels better.  Pt did start prednisone for gout flare.  When she called reporting a lot of frustration I sent in xanax. Pt states she has not filled and does not want to take.    Review of Systems  Constitutional: Negative for chills, fatigue and fever.  Respiratory: Negative for cough, chest tightness, shortness of breath and wheezing.   Cardiovascular: Negative for chest pain and palpitations.  Musculoskeletal: Negative for back pain.  Skin: Positive for rash.       See hpi.  Neurological: Negative for dizziness, speech difficulty, weakness and headaches.  Hematological: Negative for adenopathy. Does not bruise/bleed easily.  Psychiatric/Behavioral: Negative for behavioral problems.   Past Medical History:  Diagnosis Date  . Allergic rhinitis   . Anxiety    "occasionally"  . Asthma   . COPD (chronic obstructive pulmonary disease) ([place])   . GERD (gastroesophageal reflux disease)   . Mobitz type 2 second degree AV block 01/06/2015     Social History     Socioeconomic History  . Marital status: Widowed    Spouse name: Not on file  . Number of children: 1  . Years of education: Not on file  . Highest education level: Not on file  Social Needs  . Financial resource strain: Not on file  . Food insecurity - worry: Not on file  . Food insecurity - inability: Not on file  . Transportation needs - medical: Not on file  . Transportation needs - non-medical: Not on file  Occupational History  . 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 Smoking status: Former Smoker    Packs/day: 1.00    Years: 35.00    Pack years: 35.00    Types: Cigarettes    Last attempt to quit: 08/10/2012    Years since quitting: 4.9  . Smokeless tobacco: Never Used  . Tobacco comment: Started at age 31  Substance and Sexual Activity  . Alcohol use: Yes    Alcohol/week: 16.8 oz    Types: 28 Shots of liquor per week    Comment: 01/06/2015 "2 shot mixed drinks, 2/day"  . Drug use: No  . Sexual activity: No    Birth control/protection: Post-menopausal  Other Topics Concern  . Not on file  Social History Narrative   Lost husband in 12-2011, she lost her sister in 2012, brother in July 2013. She lives by herself,  God son stayed with her. She retired as Radio producer.    Past Surgical History:  Procedure Laterality Date  . [place]  . EP IMPLANTABLE DEVICE N/A 01/07/2015   Procedure: Pacemaker Implant;  Surgeon: Evans Lance, MD;  Location: [facility];  Service: Cardiovascular;  Laterality: N/A;  . PACEMAKER INSERTION    . TONSILLECTOMY AND ADENOIDECTOMY  1954    Family History  Problem Relation Age of Onset  . Coronary artery disease Mother   . Heart disease Mother        CHF  . Diabetes Neg Hx   . Cancer Neg Hx   . Stroke Neg Hx   . Colon cancer Neg Hx     Allergies  Allergen Reactions  . Avelox [Moxifloxacin Hcl In Nacl] Itching and Rash    Current Outpatient  Medications on File Prior to Visit  Medication Sig Dispense Refill  . albuterol (PROVENTIL HFA;VENTOLIN HFA) 108 (90 Base) MCG/ACT inhaler Inhale 2 puffs into the lungs every 6 (six) hours as needed for wheezing or shortness of breath. 1 Inhaler 2  . albuterol (PROVENTIL) (2.5 MG/3ML) 0.083% nebulizer solution Take 3 mLs (2.5 mg total) by nebulization every 6 (six) hours as needed for wheezing or shortness of breath. 150 mL 0  . albuterol (PROVENTIL) (2.5 MG/3ML) 0.083% nebulizer solution PUFF AND INHALE 4 TIMES DAILY**SUBTRACT 1 BOX ON NEXT REFILL, GAVE 1 BOX ON 7/11** 375 mL 1  . ALPRAZolam (XANAX) 0.5 MG tablet Take 1 tablet (0.5 mg total) by mouth every 8 (eight) hours as needed for anxiety (**DO NOT TAKE AT NIGHT WITH HYDROXYZINE**). Do not take with alcohol 15 tablet 0  . aspirin EC 81 MG EC tablet Take 1 tablet (81 mg total) by mouth daily. 30 tablet 0  . benzonatate (TESSALON) 100 MG capsule Take 1 capsule (100 mg total) by mouth every 8 (eight) hours. 21 capsule 0  . budesonide-formoterol (SYMBICORT) 160-4.5 MCG/ACT inhaler Inhale 2 puffs into the lungs 2 (two) times daily. 3 Inhaler 1  . calcium-vitamin D (OSCAL WITH D) 250-125 MG-UNIT tablet Take 1 tablet by mouth daily.    . cetirizine (ZYRTEC) 10 MG tablet Take 1 tablet (10 mg total) at bedtime as needed by mouth (for itching).    . Cholecalciferol (VITAMIN D) 2000 UNITS CAPS Take 1 capsule by mouth daily.    . clotrimazole-betamethasone (LOTRISONE) cream Apply 1 application topically 2 (two) times daily. 30 g 0  . diltiazem (CARDIZEM CD) 180 MG 24 hr capsule Take 1 capsule (180 mg total) by mouth daily. 90 capsule 3  . furosemide (LASIX) 20 MG tablet Use 1 daily as needed for occasional foot and ankle swelling 30 tablet 3  . ibuprofen (ADVIL,MOTRIN) 800 MG tablet Take 1 tablet (800 mg total) by mouth 3 (three) times daily. 21 tablet 0  . ipratropium (ATROVENT) 0.02 % nebulizer solution Take 2.5 mLs (0.5 mg total) by nebulization every 6  (six) hours as needed for wheezing or shortness of breath. 75 mL 0  . ipratropium (ATROVENT) 0.02 % nebulizer solution INHALE 2.5 MLS (0.5 MG TOTAL) INTO THE LUNGS 4 (FOUR) TIMES DAILY. DX J44.9 375 mL 0  . Multiple Vitamin (MULTIVITAMIN) tablet Take 2 tablets by mouth every morning.     . predniSONE (DELTASONE) 10 MG tablet DOSE PACK: 6 TAB PO DAY 1, 5 TAB PO DAY 2, 4 TAB PO DAY 3, 3 TAB PO DAY 4, 2 TAB PO DAY 5, 1 TAB  PO DAY 6 21 tablet 0  . silver sulfADIAZINE (SILVADENE) 1 % cream Apply 1 application daily topically. 30 g 1   No current facility-administered medications on file prior to visit.     BP 138/60 (BP Location: Right Arm, Patient Position: Sitting, Cuff Size: Small)   Pulse (!) 102   Temp 97.8 F (36.6 C) (Oral)   Resp 14   Ht 5\' 5"  (1.651 m)   Wt 149 lb (67.6 kg)   SpO2 96%   BMI 24.79 kg/m       Objective:   Physical Exam  General- No acute distress. Pleasant patient today. Does not appear agitated. Neck- Full range of motion, no jvd Lungs- Clear, even and unlabored. Heart- regular rate and rhythm. Neurologic- CNII- XII grossly intact.  Left knee-  Not swollen and  Not tender to palpatoin. Left leg- popliteal fossae  Not tender. Upper calf is no longer swollen.  Skin- small blister base of left great toe rupture previous present . Small scattered since last visit looked like healing on left foot. Looks like they all ruptured  Left forearm-scattered papules.Some on rt forearm as well.  Prior mid foot lichenified area looks a lot better.  Upper back and thigh scattered papules.      Assessment & Plan:  The cause of your skin rash and lesion are not yet determined you may have allergic reaction or bullous pemphigoid. There are other possibilities and you may need further work up and/or biopsy of the lesions. I want you to continue the taper prednisone and I wrote for higher dose hydroxyzine 1 tab po q hs as needed itching. If you have severe itching  despite one tab and you wake up middle of the night you could take 2 tab but extreme caution on side effect sedation. Attend dermatologist appointment on Monday.  In the event you have peeling of skin, mouth lesion or severe worsening rash over thanskgiving then ED evaluation.  Referral to Dr. Felipe Drone to evaluate and give opinion on sports med. Pt preferred sport med referral instead of ortho referral.  Follow up with [place] as need post dermatologist appointment.  Pt left before I could get avs done. Son had to get to business meeting. So will ask MA to mail avs today.  Jenesys Casseus, Percell Miller, PA-C

## 2017-07-05 NOTE — Telephone Encounter (Signed)
Copied from [place]. Topic: General - Other >> Jul 05, 2017  4:59 PM Damita Dunnings, CMA wrote: Reason for CRM: PA initiated via Covermymeds; KEY: [id number]. Awaiting determination.

## 2017-07-05 NOTE — Telephone Encounter (Signed)
Would you print out Wed 21st   today avs and mail to pt on Monday. She had to leave before avs done today.

## 2017-07-10 ENCOUNTER — Ambulatory Visit: Payer: PPO | Admitting: Family Medicine

## 2017-07-10 ENCOUNTER — Encounter: Payer: Self-pay | Admitting: Family Medicine

## 2017-07-10 DIAGNOSIS — M25562 Pain in left knee: Secondary | ICD-10-CM

## 2017-07-10 DIAGNOSIS — D485 Neoplasm of uncertain behavior of skin: Secondary | ICD-10-CM | POA: Diagnosis not present

## 2017-07-10 DIAGNOSIS — L309 Dermatitis, unspecified: Secondary | ICD-10-CM | POA: Diagnosis not present

## 2017-07-10 DIAGNOSIS — L308 Other specified dermatitis: Secondary | ICD-10-CM | POA: Diagnosis not present

## 2017-07-10 NOTE — Patient Instructions (Signed)
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 Capsaicin, aspercreme, or biofreeze topically up to four times a day may also help with pain. Some supplements that may help for arthritis: Boswellia extract, curcumin, pycnogenol Take the prednisone as directed by the dermatologist - I think this will help you a lot. Do NOT take ibuprofen or aleve while you're on the prednisone. Cortisone injections are an option. It's important that you continue to stay active. Straight leg raises, knee extensions 3 sets of 10 once a day (add ankle weight if these become too easy). Consider physical therapy to strengthen muscles around the joint that hurts to take pressure off of the joint itself. Shoe inserts with good arch support may be helpful. Ice 15 minutes at a time 3-4 times a day as needed to help with pain. Follow up with me in 1 month or as needed.

## 2017-07-10 NOTE — Telephone Encounter (Signed)
PA approved effective 07/05/2017 through 08/14/2018.

## 2017-07-11 ENCOUNTER — Encounter: Payer: Self-pay | Admitting: Family Medicine

## 2017-07-11 DIAGNOSIS — M25562 Pain in left knee: Secondary | ICD-10-CM

## 2017-07-11 HISTORY — DX: Pain in left knee: M25.562

## 2017-07-11 NOTE — Assessment & Plan Note (Signed)
2/2 arthritis and bakers cyst - size diminished compared to her recent doppler u/s though.  Discussed tylenol, topical medications, supplements.  Believe prednisone is helping with her knee (taking for allergic reaction).  Consider injection of knee if not continuing to improve.  Shown home exercises to do daily.  Icing.  F/u in 1 month or prn.

## 2017-07-11 NOTE — Progress Notes (Signed)
PCP: Copland, Gay Filler, MD  Subjective:   HPI: Patient is a 71 y.o. female here for left knee pain.  Patient reports she started to get pain medial and posterior left knee about 2 weeks ago. She recalls falling backwards prior to this but was 2 weeks prior to when her pain started. She has seen foot/ankle specialist, vascular specialist. She had ultrasound negative for DVT but noted baker's cyst. She has been placed on prednisone for a rash that may be due to stockings she was wearing for lower extremities. + itching and rash is throughout body. Pain level now 0/10 but a soreness at worst. No skin changes, numbness.  Past Medical History:  Diagnosis Date  . Allergic rhinitis   . Anxiety    "occasionally"  . Asthma   . COPD (chronic obstructive pulmonary disease) ([place])   . GERD (gastroesophageal reflux disease)   . Mobitz type 2 second degree AV block 01/06/2015    Current Outpatient Medications on File Prior to Visit  Medication Sig Dispense Refill  . albuterol (PROVENTIL HFA;VENTOLIN HFA) 108 (90 Base) MCG/ACT inhaler Inhale 2 puffs into the lungs every 6 (six) hours as needed for wheezing or shortness of breath. 1 Inhaler 2  . albuterol (PROVENTIL) (2.5 MG/3ML) 0.083% nebulizer solution Take 3 mLs (2.5 mg total) by nebulization every 6 (six) hours as needed for wheezing or shortness of breath. 150 mL 0  . ALPRAZolam (XANAX) 0.5 MG tablet Take 1 tablet (0.5 mg total) by mouth every 8 (eight) hours as needed for anxiety (**DO NOT TAKE AT NIGHT WITH HYDROXYZINE**). Do not take with alcohol 15 tablet 0  . aspirin EC 81 MG EC tablet Take 1 tablet (81 mg total) by mouth daily. 30 tablet 0  . budesonide-formoterol (SYMBICORT) 160-4.5 MCG/ACT inhaler Inhale 2 puffs into the lungs 2 (two) times daily. 3 Inhaler 1  . calcium-vitamin D (OSCAL WITH D) 250-125 MG-UNIT tablet Take 1 tablet by mouth daily.    . cetirizine (ZYRTEC) 10 MG tablet Take 1 tablet (10 mg total) at bedtime as needed by  mouth (for itching).    . Cholecalciferol (VITAMIN D) 2000 UNITS CAPS Take 1 capsule by mouth daily.    . clotrimazole-betamethasone (LOTRISONE) cream Apply 1 application topically 2 (two) times daily. 30 g 0  . diltiazem (CARDIZEM CD) 180 MG 24 hr capsule Take 1 capsule (180 mg total) by mouth daily. 90 capsule 3  . furosemide (LASIX) 20 MG tablet Use 1 daily as needed for occasional foot and ankle swelling 30 tablet 3  . hydrOXYzine (ATARAX/VISTARIL) 10 MG tablet Take 2 tablet po as needed for itching. 60 tablet 0  . ibuprofen (ADVIL,MOTRIN) 800 MG tablet Take 1 tablet (800 mg total) by mouth 3 (three) times daily. 21 tablet 0  . ipratropium (ATROVENT) 0.02 % nebulizer solution Take 2.5 mLs (0.5 mg total) by nebulization every 6 (six) hours as needed for wheezing or shortness of breath. 75 mL 0  . Multiple Vitamin (MULTIVITAMIN) tablet Take 2 tablets by mouth every morning.     . predniSONE (DELTASONE) 10 MG tablet DOSE PACK: 6 TAB PO DAY 1, 5 TAB PO DAY 2, 4 TAB PO DAY 3, 3 TAB PO DAY 4, 2 TAB PO DAY 5, 1 TAB PO DAY 6 21 tablet 0  . silver sulfADIAZINE (SILVADENE) 1 % cream Apply 1 application daily topically. 30 g 1   No current facility-administered medications on file prior to visit.     Past Surgical History:  Procedure Laterality Date  . [place]  . EP IMPLANTABLE DEVICE N/A 01/07/2015   Procedure: Pacemaker Implant;  Surgeon: Evans Lance, MD;  Location: [facility];  Service: Cardiovascular;  Laterality: N/A;  . PACEMAKER INSERTION    . TONSILLECTOMY AND ADENOIDECTOMY  1954    Allergies  Allergen Reactions  . Avelox [Moxifloxacin Hcl In Nacl] Itching and Rash    Social History   Socioeconomic History  . Marital status: Widowed    Spouse name: Not on file  . Number of children: 1  . Years of education: Not on file  . Highest education level: Not on file  Social Needs  . Financial resource strain: Not on file  . Food insecurity - worry:  Not on file  . Food insecurity - inability: Not on file  . Transportation needs - medical: Not on file  . Transportation needs - non-medical: Not on file  Occupational History  . 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 Smoking status: Former Smoker    Packs/day: 1.00    Years: 35.00    Pack years: 35.00    Types: Cigarettes    Last attempt to quit: 08/10/2012    Years since quitting: 4.9  . Smokeless tobacco: Never Used  . Tobacco comment: Started at age 3  Substance and Sexual Activity  . Alcohol use: Yes    Alcohol/week: 16.8 oz    Types: 28 Shots of liquor per week    Comment: 01/06/2015 "2 shot mixed drinks, 2/day"  . Drug use: No  . Sexual activity: No    Birth control/protection: Post-menopausal  Other Topics Concern  . Not on file  Social History Narrative   Lost husband in 12-2011, she lost her sister in 2012, brother in July 2013. She lives by herself, God son stayed with her. She retired as Radio producer.    Family History  Problem Relation Age of Onset  . Coronary artery disease Mother   . Heart disease Mother        CHF  . Diabetes Neg Hx   . Cancer Neg Hx   . Stroke Neg Hx   . Colon cancer Neg Hx     BP 140/65   Pulse 94   Ht 5\' 6"  (1.676 m)   Wt 146 lb (66.2 kg)   BMI 23.57 kg/m   Review of Systems: See HPI above.     Objective:  Physical Exam:  Gen: NAD, comfortable in exam room  Left knee: No gross deformity, ecchymoses.  Fullness posteriorly, mild.  No effusion. TTP medial joint line > post patellar facets. FROM with 5/5 strength. Negative ant/post drawers. Negative valgus/varus testing. Negative lachmanns. Negative mcmurrays, apleys, patellar apprehension. NV intact distally.  Right knee: No gross deformity, ecchymoses, swelling. No TTP. FROM with full strength. Negative ant/post drawers. Negative valgus/varus testing. Negative lachmanns. Negative mcmurrays, apleys, patellar  apprehension. NV intact distally.  MSK u/s left knee:  Very small bakers cyst noted.   Assessment & Plan:  1. Left knee pain - 2/2 arthritis and bakers cyst - size diminished compared to her recent doppler u/s though.  Discussed tylenol, topical medications, supplements.  Believe prednisone is helping with her knee (taking for allergic reaction).  Consider injection of knee if not continuing to improve.  Shown home exercises to do daily.  Icing.  F/u in 1 month or prn.

## 2017-07-26 ENCOUNTER — Other Ambulatory Visit: Payer: Self-pay | Admitting: Pulmonary Disease

## 2017-08-02 ENCOUNTER — Other Ambulatory Visit: Payer: Self-pay | Admitting: Medical

## 2017-08-04 NOTE — Telephone Encounter (Signed)
I decided to refill patient's hydroxyzine.  Familiar with her history and I do not want her to be severely itching over Christmas holiday.

## 2017-08-04 NOTE — Telephone Encounter (Signed)
Pt.  Requesting Hydroxyzine. Please Advise

## 2017-08-14 ENCOUNTER — Ambulatory Visit: Payer: PPO | Admitting: Family Medicine

## 2017-08-14 ENCOUNTER — Encounter: Payer: Self-pay | Admitting: Family Medicine

## 2017-08-14 DIAGNOSIS — M25562 Pain in left knee: Secondary | ICD-10-CM

## 2017-08-14 MED FILL — FUROSEMIDE 20 MG TAB: 20 | 30 days supply | Qty: 30 | Fill #2

## 2017-08-14 NOTE — Patient Instructions (Signed)
Your knee is doing well. Discuss with your PCP and dermatologist about your ongoing rash. Finish the prednisone and take either the hydroxyzine OR zyrtec as an antihistamine.

## 2017-08-14 NOTE — Progress Notes (Signed)
PCP: Copland, Gay Filler, MD  Subjective:   HPI: Patient is a 71 y.o. female here for left knee pain.  11/26: Patient reports she started to get pain medial and posterior left knee about 2 weeks ago. She recalls falling backwards prior to this but was 2 weeks prior to when her pain started. She has seen foot/ankle specialist, vascular specialist. She had ultrasound negative for DVT but noted baker's cyst. She has been placed on prednisone for a rash that may be due to stockings she was wearing for lower extremities. + itching and rash is throughout body. Pain level now 0/10 but a soreness at worst. No skin changes, numbness.  12/31: Patient reports her knee has improved. Pain level 0/10 and not bothering her currently. Biggest concerns are about her rash, some swelling into right leg. Rash is itchy, seeing dermatology and using topical steroid - she is almost finished with oral steroids - on last day. No numbness.  Past Medical History:  Diagnosis Date  . Allergic rhinitis   . Anxiety    "occasionally"  . Asthma   . COPD (chronic obstructive pulmonary disease) ([place])   . GERD (gastroesophageal reflux disease)   . Mobitz type 2 second degree AV block 01/06/2015    Current Outpatient Medications on File Prior to Visit  Medication Sig Dispense Refill  . albuterol (PROVENTIL HFA;VENTOLIN HFA) 108 (90 Base) MCG/ACT inhaler Inhale 2 puffs into the lungs every 6 (six) hours as needed for wheezing or shortness of breath. 1 Inhaler 2  . albuterol (PROVENTIL) (2.5 MG/3ML) 0.083% nebulizer solution Take 3 mLs (2.5 mg total) by nebulization every 6 (six) hours as needed for wheezing or shortness of breath. 150 mL 0  . ALPRAZolam (XANAX) 0.5 MG tablet Take 1 tablet (0.5 mg total) by mouth every 8 (eight) hours as needed for anxiety (**DO NOT TAKE AT NIGHT WITH HYDROXYZINE**). Do not take with alcohol 15 tablet 0  . aspirin EC 81 MG EC tablet Take 1 tablet (81 mg total) by mouth daily. 30  tablet 0  . budesonide-formoterol (SYMBICORT) 160-4.5 MCG/ACT inhaler Inhale 2 puffs into the lungs 2 (two) times daily. 3 Inhaler 1  . calcium-vitamin D (OSCAL WITH D) 250-125 MG-UNIT tablet Take 1 tablet by mouth daily.    . cetirizine (ZYRTEC) 10 MG tablet Take 1 tablet (10 mg total) at bedtime as needed by mouth (for itching).    . Cholecalciferol (VITAMIN D) 2000 UNITS CAPS Take 1 capsule by mouth daily.    . clotrimazole-betamethasone (LOTRISONE) cream Apply 1 application topically 2 (two) times daily. 30 g 0  . diltiazem (CARDIZEM CD) 180 MG 24 hr capsule Take 1 capsule (180 mg total) by mouth daily. 90 capsule 3  . furosemide (LASIX) 20 MG tablet Use 1 daily as needed for occasional foot and ankle swelling 30 tablet 3  . hydrOXYzine (ATARAX/VISTARIL) 10 MG tablet TAKE 2 TABLETS BY MOUTH AS NEEDED FOR ITCHING. DO NOT TAKE WITH ALPRAZOLAM 60 tablet 0  . ibuprofen (ADVIL,MOTRIN) 800 MG tablet Take 1 tablet (800 mg total) by mouth 3 (three) times daily. 21 tablet 0  . ipratropium (ATROVENT) 0.02 % nebulizer solution Take 2.5 mLs (0.5 mg total) by nebulization every 6 (six) hours as needed for wheezing or shortness of breath. 75 mL 0  . ipratropium (ATROVENT) 0.02 % nebulizer solution INHALE 2.5 MLS INTO THE LUNGS 4 TIMES DAILY. 312.5 mL 0  . Multiple Vitamin (MULTIVITAMIN) tablet Take 2 tablets by mouth every morning.     Marland Kitchen  predniSONE (DELTASONE) 10 MG tablet DOSE PACK: 6 TAB PO DAY 1, 5 TAB PO DAY 2, 4 TAB PO DAY 3, 3 TAB PO DAY 4, 2 TAB PO DAY 5, 1 TAB PO DAY 6 21 tablet 0  . silver sulfADIAZINE (SILVADENE) 1 % cream Apply 1 application daily topically. 30 g 1  . triamcinolone ointment (KENALOG) 0.1 % APPLY TO AFFECTED AREA TWICE A DAY  0   No current facility-administered medications on file prior to visit.     Past Surgical History:  Procedure Laterality Date  . [place]  . EP IMPLANTABLE DEVICE N/A 01/07/2015   Procedure: Pacemaker Implant;  Surgeon: Evans Lance, MD;  Location: [facility];  Service: Cardiovascular;  Laterality: N/A;  . PACEMAKER INSERTION    . TONSILLECTOMY AND ADENOIDECTOMY  1954    Allergies  Allergen Reactions  . Avelox [Moxifloxacin Hcl In Nacl] Itching and Rash    Social History   Socioeconomic History  . Marital status: Widowed    Spouse name: Not on file  . Number of children: 1  . Years of education: Not on file  . Highest education level: Not on file  Social Needs  . Financial resource strain: Not on file  . Food insecurity - worry: Not on file  . Food insecurity - inability: Not on file  . Transportation needs - medical: Not on file  . Transportation needs - non-medical: Not on file  Occupational History  . 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 Smoking status: Former Smoker    Packs/day: 1.00    Years: 35.00    Pack years: 35.00    Types: Cigarettes    Last attempt to quit: 08/10/2012    Years since quitting: 5.0  . Smokeless tobacco: Never Used  . Tobacco comment: Started at age 44  Substance and Sexual Activity  . Alcohol use: Yes    Alcohol/week: 16.8 oz    Types: 28 Shots of liquor per week    Comment: 01/06/2015 "2 shot mixed drinks, 2/day"  . Drug use: No  . Sexual activity: No    Birth control/protection: Post-menopausal  Other Topics Concern  . Not on file  Social History Narrative   Lost husband in 12-2011, she lost her sister in 2012, brother in July 2013. She lives by herself, God son stayed with her. She retired as Radio producer.    Family History  Problem Relation Age of Onset  . Coronary artery disease Mother   . Heart disease Mother        CHF  . Diabetes Neg Hx   . Cancer Neg Hx   . Stroke Neg Hx   . Colon cancer Neg Hx     BP 132/73   Pulse 100   Ht 5\' 6"  (1.676 m)   Wt 144 lb (65.3 kg)   BMI 23.24 kg/m   Review of Systems: See HPI above.     Objective:  Physical Exam:  Gen: NAD,  comfortable in exam room.  Left knee: No gross deformity, ecchymoses, swelling. No TTP. FROM with 5/5 strength. Negative ant/post drawers. Negative valgus/varus testing. Negative lachmanns. Negative mcmurrays, apleys, patellar apprehension. NV intact distally.   Assessment & Plan:  1. Left knee pain - much improved.  2/2 arthritis and bakers cyst.  Finishing with prednisone.  Discussed if this recurs we can consider cortisone injection.  Icing.  Tylenol if needed.  F/u prn.

## 2017-08-14 NOTE — Assessment & Plan Note (Signed)
much improved.  2/2 arthritis and bakers cyst.  Finishing with prednisone.  Discussed if this recurs we can consider cortisone injection.  Icing.  Tylenol if needed.  F/u prn.

## 2017-08-21 DIAGNOSIS — L2089 Other atopic dermatitis: Secondary | ICD-10-CM | POA: Diagnosis not present

## 2017-08-21 DIAGNOSIS — L853 Xerosis cutis: Secondary | ICD-10-CM | POA: Diagnosis not present

## 2017-08-21 DIAGNOSIS — L818 Other specified disorders of pigmentation: Secondary | ICD-10-CM | POA: Diagnosis not present

## 2017-08-21 DIAGNOSIS — B360 Pityriasis versicolor: Secondary | ICD-10-CM | POA: Diagnosis not present

## 2017-08-24 ENCOUNTER — Other Ambulatory Visit: Payer: Self-pay | Admitting: Pulmonary Disease

## 2017-08-25 DIAGNOSIS — L600 Ingrowing nail: Secondary | ICD-10-CM | POA: Diagnosis not present

## 2017-08-28 ENCOUNTER — Telehealth: Payer: Self-pay

## 2017-08-28 NOTE — Telephone Encounter (Signed)
[facility] follow up call made to patient. Patient states she has swelling in left and right leg and is double the size it is normally.  Wants to know if she can double her Furosemide 20 mg tablets. Advised patient that she needs OV so Dr. Lilian Coma examine area. Patient states she cannot come in today. Appointment scheduled for Wednesday at 1:15 pm. Advised patient if swelling gets any worse she will need to be seen sooner or go to ED. Patient states her vital signs were as follows. 125/54,96%,90 HR . Advised patient that I would forward information to [name].

## 2017-08-29 ENCOUNTER — Telehealth: Payer: Self-pay

## 2017-08-29 DIAGNOSIS — R531 Weakness: Secondary | ICD-10-CM | POA: Diagnosis not present

## 2017-08-29 NOTE — Telephone Encounter (Signed)
Patient called EMS with complaints of SOB. EMS called office stating patient refusing to go to ED for evaluation. States patient having unusual rhythms when EKG done . Aware that patient has pacemaker, concerned that patients electrolytes may be off. States they called up to see if we could convince her to go. Spoke with patient on the phone who has appointment tomorrow with provider. Advised patient that with her SOB and heart issues she needs to go with EMS to be evaluated.   Patient refusing over the phone. Advised patiet if she came to her OV tomorrow and has issues with her heart and SOB [name] will saend her to ED because she needs next level of care at this time.

## 2017-08-29 NOTE — Progress Notes (Signed)
[place] [facility] [address], [place], [place] [address] [phone]  Date:  08/30/2017   Name:  Jenna Marquez   DOB:  06-15-1946   MRN:  [id number]  PCP:  Darreld Mclean, MD    Chief Complaint: No chief complaint on file.   History of Present Illness:  Jenna Marquez is a 72 y.o. very pleasant female patient who presents with the following:  From our last visit in August:  This morning she was having some wheezing, and she did a duoneb treatment which did help - one at 6 am and another at 59 am  Yesterday her breathing was fine She is still taking her symbicort on a regular basis She did have her grass cut yesterday and she is allergic to grass No fever noted  She is taking lasix most days now for swelling in her left foot which is chronic.   She just had her pacemaker checked and was told that all looked ok Her cardiologist stopped her toprol and changed her to diltiazem- she feels more energetic on this medication  She called yesterday and the following phone note is in her chart: Patient called EMS with complaints of SOB. EMS called office stating patient refusing to go to ED for evaluation. States patient having unusual rhythms when EKG done . Aware that patient has pacemaker, concerned that patients electrolytes may be off. States they called up to see if we could convince her to go. Spoke with patient on the phone who has appointment tomorrow with provider. Advised patient that with her SOB and heart issues she needs to go with EMS to be evaluated.   Patient refusing over the phone. Advised patiet if she came to her OV tomorrow and has issues with her heart and SOB [name] will saend her to ED because she needs next level of care at this time.  Pt reports that she called EMS yesterday as she was wheezing- she did a neb and a half and still was not better so she called for health.  She brings in EKG tracing that  shows ?A fib with RVR form yesterday She is also concerned about swelling in her left foot- this is a chronic issue- and newer more severe swelling in her right foot and leg as well. Her swelling is painful to her  She also saw dermatology and they did a bx of a recnet chronic rash- they thought this might be an allergy or eczema Pt notes that "I stil have the marks, the red spots all over my body."  Pt notes that when she woke up this am she felt like there was "something heavy sitting on my heart, on my chest." She took some vinegar and this seemed to get better-  It resolved in maybe 5 minutes   We note that her weight is up significantly today from about 2 weeks ago  Wt Readings from Last 3 Encounters:  08/30/17 156 lb (70.8 kg)  08/14/17 144 lb (65.3 kg)  07/10/17 146 lb (66.2 kg)  last visit with her cardiologist/ elecrophys Dr. Curt Bears in July-  Per his last note Jenna Marquez is a 72 y.o. female who presents today for electrophysiology evaluation.   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  2:1 AV block: Medtronic dual chamber pacemaker implanted 01/07/15. Device functioning properly. No changes at this time.  2. Paroxysmal atrial fibrillation: Has refused anticoagulation in the past. She has had some high ventricular rates that appear to be 1:1 tachycardia on her device. We'll start her on 180 mg of diltiazem today. This patients CHA2DS2-VASc Score and unadjusted Ischemic Stroke Rate (% per year) is equal to 2.2 % stroke rate/year from a score of 2   Most recent echo 12/2014- - Left ventricle: The cavity size was normal. Wall thickness was   normal. The estimated ejection fraction was 65%. Wall motion was   normal; there were no regional wall motion abnormalities. - Aortic valve: Sclerosis without stenosis. There was no   significant regurgitation. - Right ventricle: The cavity size was normal. Lateral annulus  peak   S velocity: 19.4 cm/s Patient Active Problem List   Diagnosis Date Noted  . Left knee pain 07/11/2017  . PVD (peripheral vascular disease) ([place]) 04/04/2017  . HTN (hypertension) 11/24/2016  . Paroxysmal atrial fibrillation ([place]) 09/30/2016  . Acute respiratory failure with hypoxia ([place]) 09/30/2016  . Mobitz type 2 second degree AV block 09/30/2016  . Swelling of arm 05/22/2015  . Left shoulder pain 05/22/2015  . Chest pain on breathing 05/11/2015  . CKD (chronic kidney disease) stage 3, GFR 30-59 ml/min (HCC) 05/11/2015  . Pacemaker 04/22/2015  . Generalized anxiety disorder 01/11/2012  . 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   . Tobacco abuse     Past Medical History:  Diagnosis Date  . Allergic rhinitis   . Anxiety    "occasionally"  . Asthma   . COPD (chronic obstructive pulmonary disease) ([place])   . GERD (gastroesophageal reflux disease)   . Mobitz type 2 second degree AV block 01/06/2015    Past Surgical History:  Procedure Laterality Date  . [place]  . EP IMPLANTABLE DEVICE N/A 01/07/2015   Procedure: Pacemaker Implant;  Surgeon: Evans Lance, MD;  Location: [facility];  Service: Cardiovascular;  Laterality: N/A;  . PACEMAKER INSERTION    . TONSILLECTOMY AND ADENOIDECTOMY  1954    Social History   Tobacco Use  . Smoking status: Former Smoker    Packs/day: 1.00    Years: 35.00    Pack years: 35.00    Types: Cigarettes    Last attempt to quit: 08/10/2012    Years since quitting: 5.0  . Smokeless tobacco: Never Used  . Tobacco comment: Started at age 14  Substance Use Topics  . Alcohol use: Yes    Alcohol/week: 16.8 oz    Types: 28 Shots of liquor per week    Comment: 01/06/2015 "2 shot mixed drinks, 2/day"  . Drug use: No    Family History  Problem Relation Age of Onset  . Coronary artery disease Mother   . Heart disease Mother        CHF  . Diabetes Neg Hx   . Cancer Neg Hx   . Stroke Neg  Hx   . Colon cancer Neg Hx     Allergies  Allergen Reactions  . Avelox [Moxifloxacin Hcl In Nacl] Itching and Rash    Medication list has been reviewed and updated.  Current Outpatient Medications on File Prior to Visit  Medication Sig Dispense Refill  . albuterol (PROVENTIL HFA;VENTOLIN HFA) 108 (90 Base) MCG/ACT inhaler Inhale 2 puffs into the lungs every 6 (six) hours as needed for wheezing or shortness of breath. 1 Inhaler 2  . albuterol (PROVENTIL) (2.5 MG/3ML) 0.083% nebulizer solution Take 3 mLs (2.5  mg total) by nebulization every 6 (six) hours as needed for wheezing or shortness of breath. 150 mL 0  . ALPRAZolam (XANAX) 0.5 MG tablet Take 1 tablet (0.5 mg total) by mouth every 8 (eight) hours as needed for anxiety (**DO NOT TAKE AT NIGHT WITH HYDROXYZINE**). Do not take with alcohol 15 tablet 0  . aspirin EC 81 MG EC tablet Take 1 tablet (81 mg total) by mouth daily. 30 tablet 0  . budesonide-formoterol (SYMBICORT) 160-4.5 MCG/ACT inhaler Inhale 2 puffs into the lungs 2 (two) times daily. 3 Inhaler 1  . calcium-vitamin D (OSCAL WITH D) 250-125 MG-UNIT tablet Take 1 tablet by mouth daily.    . cetirizine (ZYRTEC) 10 MG tablet Take 1 tablet (10 mg total) at bedtime as needed by mouth (for itching).    . Cholecalciferol (VITAMIN D) 2000 UNITS CAPS Take 1 capsule by mouth daily.    . clotrimazole-betamethasone (LOTRISONE) cream Apply 1 application topically 2 (two) times daily. 30 g 0  . diltiazem (CARDIZEM CD) 180 MG 24 hr capsule Take 1 capsule (180 mg total) by mouth daily. 90 capsule 3  . furosemide (LASIX) 20 MG tablet Use 1 daily as needed for occasional foot and ankle swelling 30 tablet 3  . hydrOXYzine (ATARAX/VISTARIL) 10 MG tablet TAKE 2 TABLETS BY MOUTH AS NEEDED FOR ITCHING. DO NOT TAKE WITH ALPRAZOLAM 60 tablet 0  . ibuprofen (ADVIL,MOTRIN) 800 MG tablet Take 1 tablet (800 mg total) by mouth 3 (three) times daily. 21 tablet 0  . ipratropium (ATROVENT) 0.02 % nebulizer  solution Take 2.5 mLs (0.5 mg total) by nebulization every 6 (six) hours as needed for wheezing or shortness of breath. 75 mL 0  . ipratropium (ATROVENT) 0.02 % nebulizer solution INHALE 2.5 MLS INTO THE LUNGS 4 TIMES DAILY. 312.5 mL 0  . Multiple Vitamin (MULTIVITAMIN) tablet Take 2 tablets by mouth every morning.     . predniSONE (DELTASONE) 10 MG tablet DOSE PACK: 6 TAB PO DAY 1, 5 TAB PO DAY 2, 4 TAB PO DAY 3, 3 TAB PO DAY 4, 2 TAB PO DAY 5, 1 TAB PO DAY 6 21 tablet 0  . silver sulfADIAZINE (SILVADENE) 1 % cream Apply 1 application daily topically. 30 g 1  . triamcinolone ointment (KENALOG) 0.1 % APPLY TO AFFECTED AREA TWICE A DAY  0   No current facility-administered medications on file prior to visit.     Review of Systems:  As per HPI- otherwise negative. No fever noted    Physical Examination: Vitals:   08/30/17 1333  BP: (!) 101/50  Pulse: 82  Resp: 16  Temp: 97.9 F (36.6 C)  SpO2: 97%   Vitals:   08/30/17 1333  Weight: 156 lb (70.8 kg)   Body mass index is 25.18 kg/m. Ideal Body Weight:    GEN: WDWN, NAD, Non-toxic, A & O x 3, elderly, frail appearing woman HEENT: Atraumatic, Normocephalic. Neck supple. No masses, No LAD.  Bilateral TM wnl, oropharynx normal.  PEERL,EOMI.   Ears and Nose: No external deformity. CV: irregularly irregular, No M/G/R. No JVD. No thrill. No extra heart sounds. PULM: wheezing bilaterally today- No retractions. No resp. distress. No accessory muscle use. EXTR: No c/c. She has very tender, pitting edema in both legs to the mid tib NEURO Normal gait.  PSYCH: Normally interactive. Conversant. Not depressed or anxious appearing.  Calm demeanor.   EKG: sinus with frequent PVC Assessment and Plan: Irregular heart beat - Plan: EKG 12-Lead  Shortness of  breath  Lower extremity edema  Here today for a sick visit- she had summoned EMS last night as she was SOB>  They advised her to go to the ER but she refused Today she has signs of  fluid overload- weight is up, and her LE edema is worse, wheezing Referral to the ER for further evaluation- she agrees with plan    Signed Lamar Blinks, MD

## 2017-08-30 ENCOUNTER — Ambulatory Visit (INDEPENDENT_AMBULATORY_CARE_PROVIDER_SITE_OTHER): Payer: PPO | Admitting: *Deleted

## 2017-08-30 ENCOUNTER — Emergency Department (HOSPITAL_BASED_OUTPATIENT_CLINIC_OR_DEPARTMENT_OTHER): Payer: PPO

## 2017-08-30 ENCOUNTER — Other Ambulatory Visit: Payer: Self-pay

## 2017-08-30 ENCOUNTER — Encounter (HOSPITAL_BASED_OUTPATIENT_CLINIC_OR_DEPARTMENT_OTHER): Payer: Self-pay | Admitting: Emergency Medicine

## 2017-08-30 ENCOUNTER — Ambulatory Visit (INDEPENDENT_AMBULATORY_CARE_PROVIDER_SITE_OTHER): Payer: PPO | Admitting: Family Medicine

## 2017-08-30 ENCOUNTER — Telehealth: Payer: Self-pay | Admitting: Cardiology

## 2017-08-30 ENCOUNTER — Emergency Department (HOSPITAL_BASED_OUTPATIENT_CLINIC_OR_DEPARTMENT_OTHER)
Admission: EM | Admit: 2017-08-30 | Discharge: 2017-08-30 | Disposition: A | Discharge status: Home / Self Care | Payer: PPO | Attending: Emergency Medicine | Admitting: Emergency Medicine

## 2017-08-30 VITALS — BP 101/50 | HR 82 | Temp 97.9°F | Resp 16 | Wt 156.0 lb

## 2017-08-30 DIAGNOSIS — Z79899 Other long term (current) drug therapy: Secondary | ICD-10-CM | POA: Insufficient documentation

## 2017-08-30 DIAGNOSIS — N390 Urinary tract infection, site not specified: Secondary | ICD-10-CM | POA: Insufficient documentation

## 2017-08-30 DIAGNOSIS — R600 Localized edema: Secondary | ICD-10-CM | POA: Diagnosis not present

## 2017-08-30 DIAGNOSIS — J441 Chronic obstructive pulmonary disease with (acute) exacerbation: Secondary | ICD-10-CM | POA: Diagnosis not present

## 2017-08-30 DIAGNOSIS — Z950 Presence of cardiac pacemaker: Secondary | ICD-10-CM | POA: Insufficient documentation

## 2017-08-30 DIAGNOSIS — I499 Cardiac arrhythmia, unspecified: Secondary | ICD-10-CM

## 2017-08-30 DIAGNOSIS — I480 Paroxysmal atrial fibrillation: Secondary | ICD-10-CM | POA: Insufficient documentation

## 2017-08-30 DIAGNOSIS — Z87891 Personal history of nicotine dependence: Secondary | ICD-10-CM | POA: Diagnosis not present

## 2017-08-30 DIAGNOSIS — J449 Chronic obstructive pulmonary disease, unspecified: Secondary | ICD-10-CM | POA: Insufficient documentation

## 2017-08-30 DIAGNOSIS — I442 Atrioventricular block, complete: Secondary | ICD-10-CM | POA: Diagnosis not present

## 2017-08-30 DIAGNOSIS — R2241 Localized swelling, mass and lump, right lower limb: Secondary | ICD-10-CM | POA: Insufficient documentation

## 2017-08-30 DIAGNOSIS — N183 Chronic kidney disease, stage 3 (moderate): Secondary | ICD-10-CM | POA: Insufficient documentation

## 2017-08-30 DIAGNOSIS — R0602 Shortness of breath: Secondary | ICD-10-CM | POA: Insufficient documentation

## 2017-08-30 DIAGNOSIS — J45909 Unspecified asthma, uncomplicated: Secondary | ICD-10-CM | POA: Insufficient documentation

## 2017-08-30 DIAGNOSIS — Z7982 Long term (current) use of aspirin: Secondary | ICD-10-CM | POA: Insufficient documentation

## 2017-08-30 DIAGNOSIS — E876 Hypokalemia: Secondary | ICD-10-CM | POA: Diagnosis not present

## 2017-08-30 DIAGNOSIS — R609 Edema, unspecified: Secondary | ICD-10-CM

## 2017-08-30 DIAGNOSIS — I129 Hypertensive chronic kidney disease with stage 1 through stage 4 chronic kidney disease, or unspecified chronic kidney disease: Secondary | ICD-10-CM | POA: Diagnosis not present

## 2017-08-30 DIAGNOSIS — R002 Palpitations: Secondary | ICD-10-CM | POA: Diagnosis not present

## 2017-08-30 DIAGNOSIS — M7989 Other specified soft tissue disorders: Secondary | ICD-10-CM | POA: Diagnosis not present

## 2017-08-30 LAB — CBC WITH DIFFERENTIAL/PLATELET
BASOS ABS: 0.1 10*3/uL (ref 0.0–0.1)
Basophils Relative: 1 %
EOS ABS: 0.2 10*3/uL (ref 0.0–0.7)
EOS PCT: 3 %
HCT: 34.9 % — ABNORMAL LOW (ref 36.0–46.0)
Hemoglobin: 12.2 g/dL (ref 12.0–15.0)
LYMPHS ABS: 1.2 10*3/uL (ref 0.7–4.0)
Lymphocytes Relative: 22 %
MCH: 35.8 pg — AB (ref 26.0–34.0)
MCHC: 35 g/dL (ref 30.0–36.0)
MCV: 102.3 fL — ABNORMAL HIGH (ref 78.0–100.0)
MONO ABS: 1.2 10*3/uL — AB (ref 0.1–1.0)
Monocytes Relative: 22 %
Neutro Abs: 2.9 10*3/uL (ref 1.7–7.7)
Neutrophils Relative %: 52 %
PLATELETS: 244 10*3/uL (ref 150–400)
RBC: 3.41 MIL/uL — AB (ref 3.87–5.11)
RDW: 12.9 % (ref 11.5–15.5)
WBC: 5.6 10*3/uL (ref 4.0–10.5)

## 2017-08-30 LAB — URINALYSIS, MICROSCOPIC (REFLEX)

## 2017-08-30 LAB — COMPREHENSIVE METABOLIC PANEL
ALT: 37 U/L (ref 14–54)
AST: 134 U/L — ABNORMAL HIGH (ref 15–41)
Albumin: 2.9 g/dL — ABNORMAL LOW (ref 3.5–5.0)
Alkaline Phosphatase: 128 U/L — ABNORMAL HIGH (ref 38–126)
Anion gap: 15 (ref 5–15)
BUN: 7 mg/dL (ref 6–20)
CHLORIDE: 87 mmol/L — AB (ref 101–111)
CO2: 29 mmol/L (ref 22–32)
CREATININE: 1.37 mg/dL — AB (ref 0.44–1.00)
Calcium: 8 mg/dL — ABNORMAL LOW (ref 8.9–10.3)
GFR, EST AFRICAN AMERICAN: 43 mL/min — AB (ref >60)
GFR, EST NON AFRICAN AMERICAN: 37 mL/min — AB (ref >60)
Glucose, Bld: 98 mg/dL (ref 65–99)
Potassium: 2.7 mmol/L — CL (ref 3.5–5.1)
SODIUM: 131 mmol/L — AB (ref 135–145)
Total Bilirubin: 1 mg/dL (ref 0.3–1.2)
Total Protein: 6.3 g/dL — ABNORMAL LOW (ref 6.5–8.1)

## 2017-08-30 LAB — URINALYSIS, ROUTINE W REFLEX MICROSCOPIC
GLUCOSE, UA: NEGATIVE mg/dL
KETONES UR: NEGATIVE mg/dL
NITRITE: NEGATIVE
PROTEIN: NEGATIVE mg/dL
Specific Gravity, Urine: 1.01 (ref 1.005–1.030)
pH: 5.5 (ref 5.0–8.0)

## 2017-08-30 LAB — BRAIN NATRIURETIC PEPTIDE: B NATRIURETIC PEPTIDE 5: 145.7 pg/mL — AB (ref 0.0–100.0)

## 2017-08-30 LAB — TROPONIN I

## 2017-08-30 MED ORDER — POTASSIUM CHLORIDE 20 MEQ/15ML (10%) PO SOLN: 20.0000 meq | Freq: Every day | ORAL | 0 refills | Status: DC

## 2017-08-30 MED ORDER — PREDNISONE 20 MG PO TABS: ORAL_TABLET | ORAL | 0 refills | Status: DC

## 2017-08-30 MED ORDER — DEXTROSE 5 % IV SOLN
1.0000 g | Freq: Once | INTRAVENOUS | Status: AC
  Administered 2017-08-30: 1 g via INTRAVENOUS
  Filled 2017-08-30: qty 10

## 2017-08-30 MED ORDER — PREDNISONE 50 MG PO TABS
60.0000 mg | ORAL_TABLET | Freq: Once | ORAL | Status: AC
  Administered 2017-08-30: 60 mg via ORAL
  Filled 2017-08-30: qty 1

## 2017-08-30 MED ORDER — CEPHALEXIN 500 MG PO CAPS: 500.0000 mg | ORAL_CAPSULE | Freq: Three times a day (TID) | ORAL | 0 refills | Status: DC

## 2017-08-30 MED ORDER — POTASSIUM CHLORIDE CRYS ER 20 MEQ PO TBCR: 20.0000 meq | EXTENDED_RELEASE_TABLET | Freq: Every day | ORAL | 0 refills | Status: DC

## 2017-08-30 MED ORDER — POTASSIUM CHLORIDE CRYS ER 20 MEQ PO TBCR
40.0000 meq | EXTENDED_RELEASE_TABLET | Freq: Once | ORAL | Status: AC
  Administered 2017-08-30: 40 meq via ORAL
  Filled 2017-08-30: qty 2

## 2017-08-30 MED ORDER — LEVALBUTEROL HCL 0.63 MG/3ML IN NEBU
0.6300 mg | INHALATION_SOLUTION | Freq: Once | RESPIRATORY_TRACT | Status: AC
  Administered 2017-08-30: 0.63 mg via RESPIRATORY_TRACT
  Filled 2017-08-30: qty 3

## 2017-08-30 MED ORDER — POTASSIUM CHLORIDE CRYS ER 20 MEQ PO TBCR
EXTENDED_RELEASE_TABLET | ORAL | Status: AC
  Filled 2017-08-30: qty 2

## 2017-08-30 NOTE — Telephone Encounter (Signed)
LMOVM reminding pt to send remote transmission.   

## 2017-08-30 NOTE — Patient Instructions (Signed)
Pt to go to the ER for further evaluation

## 2017-08-30 NOTE — ED Provider Notes (Signed)
[facility] EMERGENCY DEPARTMENT Provider Note   CSN: [id number] Arrival date & time: 08/30/17  1429     History   Chief Complaint Chief Complaint  Patient presents with  . Palpitations    HPI Jenna Marquez is a 72 y.o. female.  HPI Patient sent to the emergency department by her PCP.  Reported that she called out EMS to her residence for shortness of breath yesterday.  Had irregular rhythm on EKG.  Refused transport to the hospital.  States he is been using breathing treatments at home with some relief.  Denies current chest pain.  States she has had right lower extremity swelling for the past 2-3 days.  There is redness and tenderness.  States she is been compliant with her Lasix.  Recently had ingrown toe surgery on third toe of the right foot. Past Medical History:  Diagnosis Date  . Allergic rhinitis   . Anxiety    "occasionally"  . Asthma   . COPD (chronic obstructive pulmonary disease) ([place])   . GERD (gastroesophageal reflux disease)   . 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 Left knee pain 07/11/2017  . PVD (peripheral vascular disease) ([place]) 04/04/2017  . HTN (hypertension) 11/24/2016  . Paroxysmal atrial fibrillation ([place]) 09/30/2016  . Acute respiratory failure with hypoxia ([place]) 09/30/2016  . Mobitz type 2 second degree AV block 09/30/2016  . Swelling of arm 05/22/2015  . Left shoulder pain 05/22/2015  . Chest pain on breathing 05/11/2015  . CKD (chronic kidney disease) stage 3, GFR 30-59 ml/min (HCC) 05/11/2015  . Pacemaker 04/22/2015  . Generalized anxiety disorder 01/11/2012  . 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   . Tobacco abuse     Past Surgical History:  Procedure Laterality Date  . [place]  . EP IMPLANTABLE DEVICE N/A 01/07/2015   Procedure: Pacemaker Implant;  Surgeon: Evans Lance, MD;  Location: [facility];   Service: Cardiovascular;  Laterality: N/A;  . PACEMAKER INSERTION    . TONSILLECTOMY AND ADENOIDECTOMY  1954    OB History    No data available       Home Medications    Prior to Admission medications   Medication Sig Start Date End Date Taking? Authorizing Provider  albuterol (PROVENTIL HFA;VENTOLIN HFA) 108 (90 Base) MCG/ACT inhaler Inhale 2 puffs into the lungs every 6 (six) hours as needed for wheezing or shortness of breath. 10/02/16   Nita Sells, MD  albuterol (PROVENTIL) (2.5 MG/3ML) 0.083% nebulizer solution Take 3 mLs (2.5 mg total) by nebulization every 6 (six) hours as needed for wheezing or shortness of breath. 03/31/17   Copland, Gay Filler, MD  ALPRAZolam Duanne Moron) 0.5 MG tablet Take 1 tablet (0.5 mg total) by mouth every 8 (eight) hours as needed for anxiety (**DO NOT TAKE AT NIGHT WITH HYDROXYZINE**). Do not take with alcohol 07/04/17   Saguier, Percell Miller, PA-C  aspirin EC 81 MG EC tablet Take 1 tablet (81 mg total) by mouth daily. 10/02/16   Nita Sells, MD  budesonide-formoterol (SYMBICORT) 160-4.5 MCG/ACT inhaler Inhale 2 puffs into the lungs 2 (two) times daily. 10/02/16   Nita Sells, MD  calcium-vitamin D (OSCAL WITH D) 250-125 MG-UNIT tablet Take 1 tablet by mouth daily.    [provider]  cephALEXin (KEFLEX) 500 MG capsule Take 1 capsule (500 mg total) by mouth 3 (three) times daily. 08/30/17  Julianne Rice, MD  cetirizine (ZYRTEC) 10 MG tablet Take 1 tablet (10 mg total) at bedtime as needed by mouth (for itching). 06/18/17   Molpus, John, MD  Cholecalciferol (VITAMIN D) 2000 UNITS CAPS Take 1 capsule by mouth daily.    [provider]  clotrimazole-betamethasone (LOTRISONE) cream Apply 1 application topically 2 (two) times daily. 06/13/17   Saguier, Percell Miller, PA-C  diltiazem (CARDIZEM CD) 180 MG 24 hr capsule Take 1 capsule (180 mg total) by mouth daily. 03/14/17   Camnitz, Ocie Doyne, MD  furosemide (LASIX) 20 MG tablet Use 1  daily as needed for occasional foot and ankle swelling 11/24/16   Copland, Gay Filler, MD  hydrOXYzine (ATARAX/VISTARIL) 10 MG tablet TAKE 2 TABLETS BY MOUTH AS NEEDED FOR ITCHING. DO NOT TAKE WITH ALPRAZOLAM 08/04/17   Saguier, Percell Miller, PA-C  ibuprofen (ADVIL,MOTRIN) 800 MG tablet Take 1 tablet (800 mg total) by mouth 3 (three) times daily. 12/12/16   Palumbo, April, MD  ipratropium (ATROVENT) 0.02 % nebulizer solution Take 2.5 mLs (0.5 mg total) by nebulization every 6 (six) hours as needed for wheezing or shortness of breath. 03/31/17   Copland, Gay Filler, MD  ipratropium (ATROVENT) 0.02 % nebulizer solution INHALE 2.5 MLS INTO THE LUNGS 4 TIMES DAILY. 08/24/17   Rigoberto Noel, MD  Multiple Vitamin (MULTIVITAMIN) tablet Take 2 tablets by mouth every morning.     [provider]  potassium chloride 20 MEQ/15ML (10%) SOLN Take 15 mLs (20 mEq total) by mouth daily. 08/30/17   Julianne Rice, MD  potassium chloride SA (K-DUR,KLOR-CON) 20 MEQ tablet Take 1 tablet (20 mEq total) by mouth daily. 08/30/17   Julianne Rice, MD  predniSONE (DELTASONE) 20 MG tablet 3 tabs po day one, then 2 po daily x 4 days 08/30/17   Julianne Rice, MD  silver sulfADIAZINE (SILVADENE) 1 % cream Apply 1 application daily topically. 06/26/17   Saguier, Percell Miller, PA-C  triamcinolone ointment (KENALOG) 0.1 % APPLY TO AFFECTED AREA TWICE A DAY 07/10/17   [provider]    Family History Family History  Problem Relation Age of Onset  . Coronary artery disease Mother   . Heart disease Mother        CHF  . Diabetes Neg Hx   . Cancer Neg Hx   . Stroke Neg Hx   . Colon cancer Neg Hx     Social History Social History   Tobacco Use  . Smoking status: Former Smoker    Packs/day: 1.00    Years: 35.00    Pack years: 35.00    Types: Cigarettes    Last attempt to quit: 08/10/2012    Years since quitting: 5.0  . Smokeless tobacco: Never Used  . Tobacco comment: Started at age 41  Substance Use Topics  .  Alcohol use: Yes    Alcohol/week: 16.8 oz    Types: 28 Shots of liquor per week    Comment: 01/06/2015 "2 shot mixed drinks, 2/day"  . Drug use: No     Allergies   Avelox [moxifloxacin hcl in nacl]   Review of Systems Review of Systems  Constitutional: Negative for chills and fever.  Eyes: Negative for visual disturbance.  Respiratory: Positive for shortness of breath and wheezing. Negative for cough.   Cardiovascular: Positive for leg swelling. Negative for chest pain and palpitations.  Gastrointestinal: Negative for abdominal pain, diarrhea, nausea and vomiting.  Genitourinary: Negative for dysuria and flank pain.  Musculoskeletal: Negative for back pain, myalgias and neck pain.  Skin: Positive for color change. Negative for rash and wound.  Neurological: Negative for dizziness, weakness, light-headedness, numbness and headaches.  All other systems reviewed and are negative.    Physical Exam Updated Vital Signs BP 124/73   Pulse 63   Temp 98 F (36.7 C) (Oral)   Resp 16   Wt 70.8 kg (156 lb)   SpO2 94%   BMI 25.18 kg/m   Physical Exam  Constitutional: She is oriented to person, place, and time. She appears well-developed and well-nourished.  HENT:  Head: Normocephalic and atraumatic.  Mouth/Throat: No oropharyngeal exudate.  Eyes: EOM are normal. Pupils are equal, round, and reactive to light.  Neck: Normal range of motion. Neck supple.  Cardiovascular:  Irregularly irregular  Pulmonary/Chest: Effort normal. She has wheezes.  Diffuse expiratory wheezes.  Abdominal: Soft. Bowel sounds are normal. There is no tenderness. There is no rebound and no guarding.  Musculoskeletal: Normal range of motion. She exhibits edema. She exhibits no tenderness.  3+ pretibial pitting edema in right lower extremity.  Patient has erythema and warmth to the dorsum of the foot.  Third digit of the right foot from previous surgery appears well healing.  No obvious erythema or swelling.   2+ pretibial pitting edema of the left lower extremity.  No tenderness noted.  Neurological: She is alert and oriented to person, place, and time.  Moves all extremities without focal deficit.  Sensation fully intact.  Skin: Skin is warm and dry. Capillary refill takes less than 2 seconds. No rash noted. No erythema.  Psychiatric: She has a normal mood and affect. Her behavior is normal.  Nursing note and vitals reviewed.    ED Treatments / Results  Labs (all labs ordered are listed, but only abnormal results are displayed) Labs Reviewed  CBC WITH DIFFERENTIAL/PLATELET - Abnormal; Notable for the following components:      Result Value   RBC 3.41 (*)    HCT 34.9 (*)    MCV 102.3 (*)    MCH 35.8 (*)    Monocytes Absolute 1.2 (*)    All other components within normal limits  COMPREHENSIVE METABOLIC PANEL - Abnormal; Notable for the following components:   Sodium 131 (*)    Potassium 2.7 (*)    Chloride 87 (*)    Creatinine, Ser 1.37 (*)    Calcium 8.0 (*)    Total Protein 6.3 (*)    Albumin 2.9 (*)    AST 134 (*)    Alkaline Phosphatase 128 (*)    GFR calc non Af Amer 37 (*)    GFR calc Af Amer 43 (*)    All other components within normal limits  BRAIN NATRIURETIC PEPTIDE - Abnormal; Notable for the following components:   B Natriuretic Peptide 145.7 (*)    All other components within normal limits  URINALYSIS, ROUTINE W REFLEX MICROSCOPIC - Abnormal; Notable for the following components:   Hgb urine dipstick TRACE (*)    Bilirubin Urine SMALL (*)    Leukocytes, UA MODERATE (*)    All other components within normal limits  URINALYSIS, MICROSCOPIC (REFLEX) - Abnormal; Notable for the following components:   Bacteria, UA MANY (*)    Squamous Epithelial / LPF 0-5 (*)    All other components within normal limits  URINE CULTURE  TROPONIN I    EKG  EKG Interpretation  Date/Time:  Wednesday August 30 2017 14:46:50 EST Ventricular Rate:  97 PR Interval:    QRS  Duration: 108 QT  Interval:  399 QTC Calculation: 472 R Axis:   8 Text Interpretation:  Sinus rhythm Paired ventricular premature complexes RSR' in V1 or V2, right VCD or RVH Confirmed by Julianne Rice [phone]) on 08/30/2017 3:09:10 PM       Radiology Dg Chest 2 View  Result Date: 08/30/2017 CLINICAL DATA:  Bilateral leg swelling 2-3 days. Heart palpitations. EXAM: CHEST  2 VIEW COMPARISON:  12/12/2016 FINDINGS: Left-sided pacemaker unchanged. Lungs are adequately inflated and otherwise clear. There flattening of the hemidiaphragms on the lateral film. Cardiomediastinal silhouette is within normal. There is calcified plaque over the aortic arch. There are degenerative changes of the spine. IMPRESSION: No acute cardiopulmonary disease. Electronically Signed   By: Marin Olp M.D.   On: 08/30/2017 16:02   US Venous Img Lower Unilateral Right  Result Date: 08/30/2017 CLINICAL DATA:  Right lower extremity swelling. EXAM: RIGHT LOWER EXTREMITY VENOUS DUPLEX ULTRASOUND TECHNIQUE: Doppler venous assessment of the right lower extremity deep venous system was performed, including characterization of spectral flow, compressibility, and phasicity. COMPARISON:  None. FINDINGS: There is complete compressibility of the right common femoral, femoral, and popliteal veins. Doppler analysis demonstrates respiratory phasicity and augmentation of flow with calf compression. No obvious superficial vein or calf vein thrombosis. IMPRESSION: No evidence of right lower extremity DVT. Electronically Signed   By: Marybelle Killings M.D.   On: 08/30/2017 16:37    Procedures Procedures (including critical care time)  Medications Ordered in ED Medications  potassium chloride SA (K-DUR,KLOR-CON) CR tablet 40 mEq (40 mEq Oral Given 08/30/17 1755)  predniSONE (DELTASONE) tablet 60 mg (60 mg Oral Given 08/30/17 1755)  levalbuterol (XOPENEX) nebulizer solution 0.63 mg (0.63 mg Nebulization Given 08/30/17 1758)  cefTRIAXone  (ROCEPHIN) 1 g in dextrose 5 % 50 mL IVPB (0 g Intravenous Stopped 08/30/17 1903)     Initial Impression / Assessment and Plan / ED Course  I have reviewed the triage vital signs and the nursing notes.  Pertinent labs & imaging results that were available during my care of the patient were reviewed by me and considered in my medical decision making (see chart for details).     Pt with known episodic a fib. Has refused anticoagulation in the past. Advised f/u with cardiologist. Will also treat for likely UTI and replace potassium. Return precautions have been given.   Final Clinical Impressions(s) / ED Diagnoses   Final diagnoses:  Paroxysmal atrial fibrillation ([place])  Peripheral edema  Acute lower UTI  COPD exacerbation (HCC)  Hypokalemia    ED Discharge Orders        Ordered    potassium chloride SA (K-DUR,KLOR-CON) 20 MEQ tablet  Daily     08/30/17 1931    predniSONE (DELTASONE) 20 MG tablet     08/30/17 1931    cephALEXin (KEFLEX) 500 MG capsule  3 times daily     08/30/17 1931    potassium chloride 20 MEQ/15ML (10%) SOLN  Daily     08/30/17 2005       Julianne Rice, MD 08/30/17 2301

## 2017-08-30 NOTE — ED Triage Notes (Signed)
Reports following up today with PCP due to swelling in bilateral legs x 2-3 days.  Reports that she also has heart palpitations and was sent to ER by PCP.  Patient unsure of shortness of breath at present due to COPD history.

## 2017-08-30 NOTE — ED Notes (Signed)
ED Provider at bedside. 

## 2017-08-30 NOTE — ED Notes (Signed)
Patient in ultrasound.

## 2017-08-31 ENCOUNTER — Encounter: Payer: Self-pay | Admitting: Cardiology

## 2017-08-31 ENCOUNTER — Telehealth: Payer: Self-pay | Admitting: Family Medicine

## 2017-08-31 ENCOUNTER — Other Ambulatory Visit: Payer: Self-pay | Admitting: *Deleted

## 2017-08-31 MED ORDER — IBUPROFEN 800 MG PO TABS: 800.0000 mg | ORAL_TABLET | Freq: Three times a day (TID) | ORAL | 0 refills | Status: DC

## 2017-08-31 NOTE — Telephone Encounter (Signed)
Pt. Called very upset that she has not received a refill on Ibuprofen 800 mg tabs.  Reported she was in the office yesterday, and was told she would be given a refill on this.  Reported she was sent to the ER after being seen by [name].  Pt. expressing dissatisfaction with the office and yelling.  C/o pain with walking.  Voiced frustration that she didn't receive the right medication; pt. yelled "I don't need Ipratropium, I need Ibuprofen."   Encouraged the pt. to calm down and take deep breaths.  Advised pt. That this nurse will send a refill order to [facility] On [address] For Ibuprofen 800 mg; #30; no refills.  Also will send this note to [name].  Pt. Agrees with plan.

## 2017-08-31 NOTE — Telephone Encounter (Signed)
Copied from [place] [phone]. Topic: Quick Communication - Rx Refill/Question >> Aug 31, 2017  9:32 AM Carolyn Stare wrote: Medication   ibuprofen (ADVIL,MOTRIN) 800 MG tablet   Has the patient contacted their pharmacy yes     Preferred Pharmacy (with phone number or street name   [facility]    Agent: Please be advised that RX refills may take up to 3 business days. We ask that you follow-up with your pharmacy.

## 2017-08-31 NOTE — Telephone Encounter (Signed)
Pt states the ibuprofen was sent to the wrong pharmacy. Pt states the    ibuprofen (ADVIL,MOTRIN) 800 MG tablet Was not at the pharmacy. Can you resend to  [facility]

## 2017-08-31 NOTE — Telephone Encounter (Signed)
Called pt- I think I sent her ibuprofen to the wrong drug store yesterday by mistake.  Let her know that this has been corrected.  Also, she can take 4 ibuprofen 200 OTC to equal 800 mg if she gets in a pinch

## 2017-08-31 NOTE — Telephone Encounter (Unsigned)
Copied from [place] [id number]. Topic: Quick Communication - Rx Refill/Question >> Aug 31, 2017  9:23 AM Carolyn Stare wrote: Medication  ibuprofen (ADVIL,MOTRIN) 800 MG tablet      pt spoke to pharmacy     [facility]    Agent: Please be advised that RX refills may take up to 3 business days. We ask that you follow-up with your pharmacy.

## 2017-08-31 NOTE — Telephone Encounter (Signed)
LR: 12/12/17 #21 tabs. Unable to find OV. Pt wants med for her back and Right LE pain. She was seen in ED last night and is wanting Ibuprofen for pain control.

## 2017-08-31 NOTE — Telephone Encounter (Unsigned)
Copied from [place] [phone]. Topic: Quick Communication - Rx Refill/Question >> Aug 31, 2017  9:23 AM Carolyn Stare wrote: Medication  ibuprofen (ADVIL,MOTRIN) 800 MG tablet   [facility]    Preferred Pharmacy (with phone number or street name): ***   Agent: Please be advised that RX refills may take up to 3 business days. We ask that you follow-up with your pharmacy.

## 2017-08-31 NOTE — Progress Notes (Addendum)
Remote pacemaker transmission.   

## 2017-09-01 LAB — URINE CULTURE

## 2017-09-05 ENCOUNTER — Telehealth: Payer: Self-pay | Admitting: Family Medicine

## 2017-09-05 DIAGNOSIS — E876 Hypokalemia: Secondary | ICD-10-CM

## 2017-09-05 NOTE — Telephone Encounter (Signed)
Called her- she is doing well.  She will come in on Thursday to have a BMP drawn. However she would also like to get a flu shot- will ask to have her placed on the nurse visit schedule so this can be done also

## 2017-09-05 NOTE — Telephone Encounter (Signed)
Pt sent to ER on 08/30/17 and Potassium was critical low. Pt has no future appts scheduled with you. Has f/u with cardiology on 09/26/17  Please advise?

## 2017-09-05 NOTE — Telephone Encounter (Signed)
Copied from [name]. Topic: Quick Communication - See Telephone Encounter >> Sep 05, 2017  3:41 PM Vernona Rieger wrote: CRM for notification. See Telephone encounter for:   09/05/17.  Patient wants to know if she needs to refill potassium chloride 20 MEQ/15ML (10%) SOLN.  Call back is [phone]

## 2017-09-05 NOTE — Telephone Encounter (Signed)
Please advise 

## 2017-09-06 ENCOUNTER — Encounter: Payer: Self-pay | Admitting: Cardiology

## 2017-09-06 NOTE — Telephone Encounter (Signed)
Called patient to schedule appointment for flu shot. Also placed on lab schedule for tomorrow.

## 2017-09-07 ENCOUNTER — Other Ambulatory Visit (INDEPENDENT_AMBULATORY_CARE_PROVIDER_SITE_OTHER): Payer: PPO

## 2017-09-07 ENCOUNTER — Ambulatory Visit (INDEPENDENT_AMBULATORY_CARE_PROVIDER_SITE_OTHER): Payer: PPO

## 2017-09-07 DIAGNOSIS — Z23 Encounter for immunization: Secondary | ICD-10-CM | POA: Diagnosis not present

## 2017-09-07 DIAGNOSIS — E876 Hypokalemia: Secondary | ICD-10-CM

## 2017-09-07 LAB — BASIC METABOLIC PANEL
BUN: 13 mg/dL (ref 6–23)
CHLORIDE: 97 meq/L (ref 96–112)
CO2: 27 mEq/L (ref 19–32)
CREATININE: 1.04 mg/dL (ref 0.40–1.20)
Calcium: 9 mg/dL (ref 8.4–10.5)
GFR: 66.64 mL/min (ref >60.00)
Glucose, Bld: 78 mg/dL (ref 70–99)
POTASSIUM: 4.8 meq/L (ref 3.5–5.1)
SODIUM: 136 meq/L (ref 135–145)

## 2017-09-08 ENCOUNTER — Telehealth: Payer: Self-pay

## 2017-09-08 LAB — CUP PACEART REMOTE DEVICE CHECK
Battery Remaining Longevity: 116 mo
Battery Voltage: 2.79 V
Brady Statistic AP VS Percent: 0 %
Brady Statistic AS VP Percent: 76 %
Implantable Lead Implant Date: 20160525
Implantable Lead Location: 753860
Implantable Lead Model: 5076
Implantable Pulse Generator Implant Date: 20160525
Lead Channel Pacing Threshold Amplitude: 0.5 V
Lead Channel Pacing Threshold Amplitude: 0.5 V
Lead Channel Pacing Threshold Pulse Width: 0.4 ms
Lead Channel Pacing Threshold Pulse Width: 0.4 ms
Lead Channel Setting Pacing Amplitude: 1.5 V
Lead Channel Setting Pacing Pulse Width: 0.4 ms
Lead Channel Setting Sensing Sensitivity: 5.6 mV
MDC IDC LEAD IMPLANT DT: 20160525
MDC IDC LEAD LOCATION: 753859
MDC IDC MSMT BATTERY IMPEDANCE: 182 Ohm
MDC IDC MSMT LEADCHNL RA IMPEDANCE VALUE: 506 Ohm
MDC IDC MSMT LEADCHNL RV IMPEDANCE VALUE: 529 Ohm
MDC IDC SESS DTM: 20190118213054
MDC IDC SET LEADCHNL RV PACING AMPLITUDE: 2.5 V
MDC IDC STAT BRADY AP VP PERCENT: 1 %
MDC IDC STAT BRADY AS VS PERCENT: 22 %

## 2017-09-08 MED ORDER — DILTIAZEM HCL ER COATED BEADS 180 MG PO CP24: 180.0000 mg | ORAL_CAPSULE | Freq: Every day | ORAL | 3 refills | Status: DC

## 2017-09-08 NOTE — Telephone Encounter (Signed)
-----   Message from Will Meredith Leeds, MD sent at 09/08/2017 11:28 AM EST ----- Abnormal device interrogation reviewed.  Lead parameters and battery status stable.  AT seen. Increase diltiazem to 300 mg.

## 2017-09-08 NOTE — Telephone Encounter (Signed)
Per Dr.Camntiz pt to start Cardizem CD 180mg  24hr capsule.

## 2017-09-08 NOTE — Telephone Encounter (Signed)
Spoke with pt, pt stated that she wasn't taking Cardizem at all and doesn't recall the medicine at all. Informed her that I would speak with Dr Curt Bears regarding dosage.

## 2017-09-09 ENCOUNTER — Encounter: Payer: Self-pay | Admitting: Family Medicine

## 2017-09-19 ENCOUNTER — Encounter: Payer: Self-pay | Admitting: Cardiology

## 2017-09-23 ENCOUNTER — Other Ambulatory Visit: Payer: Self-pay | Admitting: Family Medicine

## 2017-09-26 ENCOUNTER — Encounter: Payer: PPO | Admitting: Cardiology

## 2017-09-27 ENCOUNTER — Other Ambulatory Visit: Payer: Self-pay | Admitting: Pulmonary Disease

## 2017-09-30 ENCOUNTER — Other Ambulatory Visit: Payer: Self-pay | Admitting: Pulmonary Disease

## 2017-10-02 ENCOUNTER — Telehealth: Payer: Self-pay | Admitting: Family Medicine

## 2017-10-02 DIAGNOSIS — R21 Rash and other nonspecific skin eruption: Secondary | ICD-10-CM | POA: Diagnosis not present

## 2017-10-02 DIAGNOSIS — L270 Generalized skin eruption due to drugs and medicaments taken internally: Secondary | ICD-10-CM | POA: Diagnosis not present

## 2017-10-02 DIAGNOSIS — I872 Venous insufficiency (chronic) (peripheral): Secondary | ICD-10-CM | POA: Diagnosis not present

## 2017-10-02 NOTE — Telephone Encounter (Signed)
Copied from [place]. Topic: Quick Communication - See Telephone Encounter >> Oct 02, 2017  4:48 PM Vernona Rieger wrote: CRM for notification. See Telephone encounter for:   10/02/17.  Pt wants to know if the office received a fax from [facility] [place] [facility] in regards to her furosemide (LASIX) 20 MG tablet. Dr Susie Cassette wants her to take something else in place of the Lasix, she thinks that is what is breaking her out.  Call back [phone]

## 2017-10-02 NOTE — Telephone Encounter (Signed)
Please advise 

## 2017-10-03 MED ORDER — HYDROCHLOROTHIAZIDE 25 MG PO TABS: ORAL_TABLET | ORAL | 3 refills | Status: DC

## 2017-10-03 NOTE — Telephone Encounter (Signed)
Called her back I am not sure why it was determined that her lasix is at fault but I will change to HCTZ instead.  She uses this prn for swelling.  Does not use daily Asked her to come in and see me in 2-3 weeks so we can check on her fluid status and lytes  This is the 2nd time in a month that she has called in upset - last time because I had not understood which pharmacy she preferred for her ibuprofen rx.   See phone note 1/17 She also complained that she did not hear about her labs from 1/24.  Stated that she DOES use her mychart account.  I explained to her that I had sent her 2 messages about her labs on 1/26- neither has been read.  I read this message to her over the phone so that she would have the information   Offered to help her find a different doctor if she is dissatisfied with our care. Otherwise I will need her to try and give me at least 24 hours to respond to a message from her in the future, unless it is a true emergency

## 2017-10-03 NOTE — Telephone Encounter (Signed)
Spoke with patient, she is upset that she has been having this reaction since November and has been advised by dermatology is probably from the Lasix. She feels she is being "neglected, because she needs her medication changed and is not getting done".

## 2017-10-09 NOTE — Progress Notes (Signed)
Electrophysiology Office Note   Date:  10/10/2017   ID:  Jenna Marquez, DOB Jun 04, 1946, MRN [id number]  PCP:  Darreld Mclean, MD  Cardiologist:  Lovena Le Primary Electrophysiologist:  Yadier Bramhall Meredith Leeds, MD    Chief Complaint  Patient presents with  . Pacemaker Check    Complete heart block/PAF     History of Present Illness: Jenna Marquez is a 72 y.o. female who presents today for electrophysiology evaluation.   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   Today, denies symptoms of palpitations, chest pain, shortness of breath, orthopnea, PND, lower extremity edema, claudication, dizziness, presyncope, syncope, bleeding, or neurologic sequela. The patient is tolerating medications without difficulties.  She is overall feeling well without cardiac complaints.  She does have a rash on her legs and swelling.  She also gets short of breath with exertion.    Past Medical History:  Diagnosis Date  . Allergic rhinitis   . Anxiety    "occasionally"  . Asthma   . COPD (chronic obstructive pulmonary disease) ([place])   . GERD (gastroesophageal reflux disease)   . Mobitz type 2 second degree AV block 01/06/2015   Past Surgical History:  Procedure Laterality Date  . [place]  . EP IMPLANTABLE DEVICE N/A 01/07/2015   Procedure: Pacemaker Implant;  Surgeon: Evans Lance, MD;  Location: [facility];  Service: Cardiovascular;  Laterality: N/A;  . PACEMAKER INSERTION    . TONSILLECTOMY AND ADENOIDECTOMY  1954     Current Outpatient Medications  Medication Sig Dispense Refill  . albuterol (PROVENTIL HFA;VENTOLIN HFA) 108 (90 Base) MCG/ACT inhaler Inhale 2 puffs into the lungs every 6 (six) hours as needed for wheezing or shortness of breath. 1 Inhaler 2  . albuterol (PROVENTIL) (2.5 MG/3ML) 0.083% nebulizer solution Take 3 mLs (2.5 mg total) by nebulization every 6 (six) hours as needed for  wheezing or shortness of breath. 150 mL 0  . ALPRAZolam (XANAX) 0.5 MG tablet Take 1 tablet (0.5 mg total) by mouth every 8 (eight) hours as needed for anxiety (**DO NOT TAKE AT NIGHT WITH HYDROXYZINE**). Do not take with alcohol 15 tablet 0  . aspirin EC 81 MG EC tablet Take 1 tablet (81 mg total) by mouth daily. 30 tablet 0  . budesonide-formoterol (SYMBICORT) 160-4.5 MCG/ACT inhaler Inhale 2 puffs into the lungs 2 (two) times daily. 3 Inhaler 1  . calcium-vitamin D (OSCAL WITH D) 250-125 MG-UNIT tablet Take 1 tablet by mouth daily.    . Cholecalciferol (VITAMIN D) 2000 UNITS CAPS Take 1 capsule by mouth daily.    Marland Kitchen diltiazem (CARDIZEM CD) 180 MG 24 hr capsule Take 1 capsule (180 mg total) by mouth daily. 90 capsule 3  . hydrochlorothiazide (HYDRODIURIL) 25 MG tablet Take 1/2 or 1 daily as needed for swelling of legs 30 tablet 3  . hydrOXYzine (ATARAX/VISTARIL) 10 MG tablet TAKE 2 TABLETS BY MOUTH AS NEEDED FOR ITCHING. DO NOT TAKE WITH ALPRAZOLAM 60 tablet 0  . ibuprofen (ADVIL,MOTRIN) 800 MG tablet Take 1 tablet (800 mg total) by mouth 3 (three) times daily. 30 tablet 0  . ipratropium (ATROVENT) 0.02 % nebulizer solution Take 2.5 mLs (0.5 mg total) by nebulization every 6 (six) hours as needed for wheezing or shortness of breath. 75 mL 0  . Multiple Vitamin (MULTIVITAMIN) tablet Take 2 tablets by mouth every morning.     . triamcinolone ointment (KENALOG) 0.1 %  APPLY TO AFFECTED AREA TWICE A DAY  0   No current facility-administered medications for this visit.     Allergies:   Avelox [moxifloxacin hcl in nacl]   Social History:  The patient  reports that she quit smoking about 5 years ago. Her smoking use included cigarettes. She has a 35.00 pack-year smoking history. she has never used smokeless tobacco. She reports that she drinks about 16.8 oz of alcohol per week. She reports that she does not use drugs.   Family History:  The patient's family history includes Coronary artery disease in  her mother; Heart disease in her mother.   ROS:  Please see the history of present illness.   Otherwise, review of systems is positive for weight change, leg swelling, shortness of breath, leg pain, cough, wheezing, nausea, balance problems, rash, dizziness.   All other systems are reviewed and negative.   PHYSICAL EXAM: VS:  BP 110/60   Pulse 94   Ht 5' 6.5" (1.689 m)   Wt 145 lb (65.8 kg)   BMI 23.05 kg/m  , BMI Body mass index is 23.05 kg/m. GEN: Well nourished, well developed, in no acute distress  HEENT: normal  Neck: no JVD, carotid bruits, or masses Cardiac: RRR; no murmurs, rubs, or gallops,no edema  Respiratory:  clear to auscultation bilaterally, normal work of breathing GI: soft, nontender, nondistended, + BS MS: no deformity or atrophy  Skin: warm and dry, device site well healed Neuro:  Strength and sensation are intact Psych: euthymic mood, full affect  EKG:  EKG is not ordered today. Personal review of the ekg ordered 08/30/17 shows SR, rate 97, PVCs  Personal review of the device interrogation today. Results in [place]: 08/30/2017: ALT 37; B Natriuretic Peptide 145.7; Hemoglobin 12.2; Platelets 244 09/07/2017: BUN 13; Creatinine, Ser 1.04; Potassium 4.8; Sodium 136    Lipid Panel     Component Value Date/Time   CHOL 220 (H) 11/24/2016 1035   TRIG 52.0 11/24/2016 1035   HDL 129.40 11/24/2016 1035   CHOLHDL 2 11/24/2016 1035   VLDL 10.4 11/24/2016 1035   LDLCALC 81 11/24/2016 1035   LDLDIRECT 114.2 01/30/2013 1133     Wt Readings from Last 3 Encounters:  10/10/17 145 lb (65.8 kg)  08/30/17 156 lb (70.8 kg)  08/30/17 156 lb (70.8 kg)      Other studies Reviewed: Additional studies/ records that were reviewed today include:  TTE 01/07/15  Review of the above records today demonstrates:  - Left ventricle: The cavity size was normal. Wall thickness was normal. The estimated ejection fraction was 65%. Wall motion was normal; there were  no regional wall motion abnormalities. - Aortic valve: Sclerosis without stenosis. There was no significant regurgitation. - Right ventricle: The cavity size was normal. Lateral annulus peak S velocity: 19.4 cm/s.   ASSESSMENT AND PLAN:  1.  2:1 AV block: Tronic dual-chamber pacemaker implanted 01/07/15 device functioning appropriately.  She is currently AV conducting and thus we Jenna Marquez switch her to MVP mode.  She was initially pacing in the ventricle 70% of the time.    2. Paroxysmal atrial fibrillation: Is refused anticoagulation in the past.  She does have high ventricular rates that appear to be a one-to-one tachycardia on her device.  She has not been taking her diltiazem, has been taking metoprolol.  We Jenna Marquez increase metoprolol to 25 mg twice a day.  This patients CHA2DS2-VASc Score and unadjusted Ischemic Stroke Rate (% per year) is equal  to 2.2 % stroke rate/year from a score of 2  Above score calculated as 1 point each if present [CHF, HTN, DM, Vascular=MI/PAD/Aortic Plaque, Age if 65-74, or Female] Above score calculated as 2 points each if present [Age > 75, or Stroke/TIA/TE]    Current medicines are reviewed at length with the patient today.   The patient does not have concerns regarding her medicines.  The following changes were made today: Increase metoprolol  Labs/ tests ordered today include:  No orders of the defined types were placed in this encounter.    Disposition:   FU with Jenna Marquez 6 months  Signed, Jenna Mckoy Meredith Leeds, MD  10/10/2017 10:50 AM     [facility] [address] [place]  [address] [phone] (office) [phone] (fax)

## 2017-10-10 ENCOUNTER — Encounter: Payer: Self-pay | Admitting: Cardiology

## 2017-10-10 ENCOUNTER — Ambulatory Visit: Payer: PPO | Admitting: Cardiology

## 2017-10-10 VITALS — BP 110/60 | HR 94 | Ht 66.5 in | Wt 145.0 lb

## 2017-10-10 DIAGNOSIS — I480 Paroxysmal atrial fibrillation: Secondary | ICD-10-CM

## 2017-10-10 DIAGNOSIS — I441 Atrioventricular block, second degree: Secondary | ICD-10-CM | POA: Diagnosis not present

## 2017-10-10 LAB — CUP PACEART INCLINIC DEVICE CHECK
Battery Impedance: 158 Ohm
Battery Remaining Longevity: 122 mo
Battery Voltage: 2.79 V
Brady Statistic AP VP Percent: 1 %
Brady Statistic AS VP Percent: 74 %
Date Time Interrogation Session: 20190226113736
Implantable Lead Implant Date: 20160525
Implantable Lead Implant Date: 20160525
Implantable Lead Model: 5076
Implantable Pulse Generator Implant Date: 20160525
Lead Channel Impedance Value: 531 Ohm
Lead Channel Pacing Threshold Amplitude: 0.5 V
Lead Channel Pacing Threshold Pulse Width: 0.4 ms
Lead Channel Pacing Threshold Pulse Width: 0.4 ms
Lead Channel Sensing Intrinsic Amplitude: 8 mV
Lead Channel Setting Pacing Amplitude: 1.5 V
Lead Channel Setting Sensing Sensitivity: 2 mV
MDC IDC LEAD LOCATION: 753859
MDC IDC LEAD LOCATION: 753860
MDC IDC MSMT LEADCHNL RA IMPEDANCE VALUE: 484 Ohm
MDC IDC MSMT LEADCHNL RA PACING THRESHOLD AMPLITUDE: 0.5 V
MDC IDC MSMT LEADCHNL RA SENSING INTR AMPL: 2 mV
MDC IDC MSMT LEADCHNL RV PACING THRESHOLD AMPLITUDE: 0.5 V
MDC IDC MSMT LEADCHNL RV PACING THRESHOLD AMPLITUDE: 0.5 V
MDC IDC MSMT LEADCHNL RV PACING THRESHOLD PULSEWIDTH: 0.4 ms
MDC IDC MSMT LEADCHNL RV PACING THRESHOLD PULSEWIDTH: 0.4 ms
MDC IDC SET LEADCHNL RV PACING AMPLITUDE: 2.5 V
MDC IDC SET LEADCHNL RV PACING PULSEWIDTH: 0.4 ms
MDC IDC STAT BRADY AP VS PERCENT: 0 %
MDC IDC STAT BRADY AS VS PERCENT: 24 %

## 2017-10-10 MED ORDER — METOPROLOL TARTRATE 25 MG PO TABS: 25.0000 mg | ORAL_TABLET | Freq: Two times a day (BID) | ORAL | 2 refills | Status: DC

## 2017-10-10 NOTE — Patient Instructions (Addendum)
Medication Instructions:  Your physician has recommended you make the following change in your medication:  1. START Lopressor (metoprolol tartrate) 25 mg twice daily.  * If you need a refill on your cardiac medications before your next appointment, please call your pharmacy. *  Labwork: None ordered  Testing/Procedures: None ordered  Follow-Up: Your physician wants you to follow-up in: 6 months with Dr. Curt Bears.  You will receive a reminder letter in the mail two months in advance. If you don't receive a letter, please call our office to schedule the follow-up appointment.  Thank you for choosing [facility]!!   Trinidad Curet, RN [phone]

## 2017-10-14 NOTE — Progress Notes (Addendum)
[facility] [address] [place], [place] [address] [phone]  Date:  10/16/2017   Name:  Jenna Marquez   DOB:  09/08/1945   MRN:  [id number]  PCP:  Darreld Mclean, MD    Chief Complaint: Follow-up   History of Present Illness:  Jenna Marquez is a 72 y.o. very pleasant female patient who presents with the following:  Following up today-  History of PVD, HTN, a fib/ pacemaker, COPD, CKD  We recently tried changing her prn lasix to hctz in case the lasix was the cause of a chronic rash - see phone note from 2/18 Jenna Marquez does feel like this has helped with her rash.  Also her urinary frequency is better.  Jenna Marquez continues to have some swelling in her legs, more so on the right.  Jenna Marquez also does have some itching, but is seeing Derm for this again on 3/18 Jenna Marquez also notes that the sight of men with beards will make her start itching- either in real life or on TV.    Jenna Marquez saw Dr. Curt Bears about a week ago to discuss her 2:1 AV block: 1.  2:1 AV block: Tronic dual-chamber pacemaker implanted 01/07/15 device functioning appropriately.  Jenna Marquez is currently AV conducting and thus we will switch her to MVP mode.  Jenna Marquez was initially pacing in the ventricle 70% of the time.   2. Paroxysmal atrial fibrillation: Is refused anticoagulation in the past.  Jenna Marquez does have high ventricular rates that appear to be a one-to-one tachycardia on her device.  Jenna Marquez has not been taking her diltiazem, has been taking metoprolol.  We will increase metoprolol to 25 mg twice a day. This patients CHA2DS2-VASc Score and unadjusted Ischemic Stroke Rate (% per year) is equal to 2.2 % stroke rate/year from a score of 2  BP Readings from Last 3 Encounters:  10/16/17 132/70  10/10/17 110/60  08/30/17 124/73   Wt Readings from Last 3 Encounters:  10/16/17 148 lb (67.1 kg)  10/10/17 145 lb (65.8 kg)  08/30/17 156 lb (70.8 kg)   Her BNP back in January was 145 Jenna Marquez  notes that her legs go down overnight and look fine in the am. The swelling comes on in the mid morning.  Jenna Marquez did try compression hose at some point in the past, but blames compression hose for the initial itching issue that Jenna Marquez had months ago.  However Jenna Marquez is willing to try compression socks- her CMA who helps with her bath uses these   We did a right lower extremity US for her last month which was negative as Jenna Marquez complained of more swelling on the right than on the left   Patient Active Problem List   Diagnosis Date Noted  . Left knee pain 07/11/2017  . PVD (peripheral vascular disease) ([place]) 04/04/2017  . HTN (hypertension) 11/24/2016  . Paroxysmal atrial fibrillation ([place]) 09/30/2016  . Acute respiratory failure with hypoxia ([place]) 09/30/2016  . Mobitz type 2 second degree AV block 09/30/2016  . Swelling of arm 05/22/2015  . Left shoulder pain 05/22/2015  . Chest pain on breathing 05/11/2015  . CKD (chronic kidney disease) stage 3, GFR 30-59 ml/min ([facility]) 05/11/2015  . Pacemaker 04/22/2015  . Generalized anxiety disorder 01/11/2012  . 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   . Tobacco abuse     Past Medical History:  Diagnosis Date  . Allergic rhinitis   . Anxiety    "  occasionally"  . Asthma   . COPD (chronic obstructive pulmonary disease) ([place])   . GERD (gastroesophageal reflux disease)   . Mobitz type 2 second degree AV block 01/06/2015    Past Surgical History:  Procedure Laterality Date  . [place]  . EP IMPLANTABLE DEVICE N/A 01/07/2015   Procedure: Pacemaker Implant;  Surgeon: Evans Lance, MD;  Location: [place];  Service: Cardiovascular;  Laterality: N/A;  . PACEMAKER INSERTION    . TONSILLECTOMY AND ADENOIDECTOMY  1954    Social History   Tobacco Use  . Smoking status: Former Smoker    Packs/day: 1.00    Years: 35.00    Pack years: 35.00    Types: Cigarettes    Last attempt to quit: 08/10/2012    Years  since quitting: 5.1  . Smokeless tobacco: Never Used  . Tobacco comment: Started at age 9  Substance Use Topics  . Alcohol use: Yes    Alcohol/week: 16.8 oz    Types: 28 Shots of liquor per week    Comment: 01/06/2015 "2 shot mixed drinks, 2/day"  . Drug use: No    Family History  Problem Relation Age of Onset  . Coronary artery disease Mother   . Heart disease Mother        CHF  . Diabetes Neg Hx   . Cancer Neg Hx   . Stroke Neg Hx   . Colon cancer Neg Hx     Allergies  Allergen Reactions  . Avelox [Moxifloxacin Hcl In Nacl] Itching and Rash    Medication list has been reviewed and updated.  Current Outpatient Medications on File Prior to Visit  Medication Sig Dispense Refill  . albuterol (PROVENTIL HFA;VENTOLIN HFA) 108 (90 Base) MCG/ACT inhaler Inhale 2 puffs into the lungs every 6 (six) hours as needed for wheezing or shortness of breath. 1 Inhaler 2  . albuterol (PROVENTIL) (2.5 MG/3ML) 0.083% nebulizer solution Take 3 mLs (2.5 mg total) by nebulization every 6 (six) hours as needed for wheezing or shortness of breath. 150 mL 0  . ALPRAZolam (XANAX) 0.5 MG tablet Take 1 tablet (0.5 mg total) by mouth every 8 (eight) hours as needed for anxiety (**DO NOT TAKE AT NIGHT WITH HYDROXYZINE**). Do not take with alcohol 15 tablet 0  . aspirin EC 81 MG EC tablet Take 1 tablet (81 mg total) by mouth daily. 30 tablet 0  . budesonide-formoterol (SYMBICORT) 160-4.5 MCG/ACT inhaler Inhale 2 puffs into the lungs 2 (two) times daily. 3 Inhaler 1  . calcium-vitamin D (OSCAL WITH D) 250-125 MG-UNIT tablet Take 1 tablet by mouth daily.    . Cholecalciferol (VITAMIN D) 2000 UNITS CAPS Take 1 capsule by mouth daily.    . hydrochlorothiazide (HYDRODIURIL) 25 MG tablet Take 1/2 or 1 daily as needed for swelling of legs 30 tablet 3  . hydrOXYzine (ATARAX/VISTARIL) 10 MG tablet TAKE 2 TABLETS BY MOUTH AS NEEDED FOR ITCHING. DO NOT TAKE WITH ALPRAZOLAM 60 tablet 0  . ibuprofen (ADVIL,MOTRIN) 800  MG tablet Take 1 tablet (800 mg total) by mouth 3 (three) times daily. 30 tablet 0  . ipratropium (ATROVENT) 0.02 % nebulizer solution Take 2.5 mLs (0.5 mg total) by nebulization every 6 (six) hours as needed for wheezing or shortness of breath. 75 mL 0  . metoprolol tartrate (LOPRESSOR) 25 MG tablet Take 1 tablet (25 mg total) by mouth 2 (two) times daily. 180 tablet 2  . Multiple Vitamin (MULTIVITAMIN) tablet Take 2  tablets by mouth every morning.     . triamcinolone ointment (KENALOG) 0.1 % APPLY TO AFFECTED AREA TWICE A DAY  0   No current facility-administered medications on file prior to visit.     Review of Systems:  As per HPI- otherwise negative.   Physical Examination: Vitals:   10/16/17 1131  BP: 132/70  Pulse: 84  Resp: 16  Temp: 97.6 F (36.4 C)  SpO2: 98%   Vitals:   10/16/17 1131  Weight: 148 lb (67.1 kg)   Body mass index is 23.53 kg/m. Ideal Body Weight:    GEN: WDWN, NAD, Non-toxic, A & O x 3 HEENT: Atraumatic, Normocephalic. Neck supple. No masses, No LAD. Ears and Nose: No external deformity. CV: RRR, No M/G/R. No JVD. No thrill. No extra heart sounds. PULM: CTA B, no wheezes, crackles, rhonchi. No retractions. No resp. distress. No accessory muscle use.Marland Kitchen EXTR: No c/c/e NEURO Normal gait for pt- slow but steady  PSYCH: Normally interactive. Conversant. Not depressed or anxious appearing.  Calm demeanor.  Looks well, her normal self Minimal soft edema of the right leg with evidence of chronic atopic dermatitis in same distribution  Assessment and Plan: Medication monitoring encounter - Plan: Basic metabolic panel  Swelling of lower extremity - Plan: Basic metabolic panel  Following up today We changed her from lasix to hctz due to concern of this triggering her skin itching Will check her BMP today Her BP and pulse look good with recent med adjustment by Dr. Curt Bears Jenna Marquez is willing to try compression socks for her swelling- these may be helpful  for her  Will plan further follow- up pending labs. Follow-up in 3-4 months   Signed Lamar Blinks, MD  Received her labs, message to pt  Results for orders placed or performed in visit on 88/91/69  Basic metabolic panel  Result Value Ref Range   Sodium 137 135 - 145 mEq/L   Potassium 4.6 3.5 - 5.1 mEq/L   Chloride 98 96 - 112 mEq/L   CO2 27 19 - 32 mEq/L   Glucose, Bld 80 70 - 99 mg/dL   BUN 10 6 - 23 mg/dL   Creatinine, Ser 0.83 0.40 - 1.20 mg/dL   Calcium 9.5 8.4 - 10.5 mg/dL   GFR 86.42 >60.00 mL/min

## 2017-10-16 ENCOUNTER — Encounter: Payer: Self-pay | Admitting: Family Medicine

## 2017-10-16 ENCOUNTER — Ambulatory Visit (INDEPENDENT_AMBULATORY_CARE_PROVIDER_SITE_OTHER): Payer: PPO | Admitting: Family Medicine

## 2017-10-16 VITALS — BP 132/70 | HR 84 | Temp 97.6°F | Resp 16 | Wt 148.0 lb

## 2017-10-16 DIAGNOSIS — M7989 Other specified soft tissue disorders: Secondary | ICD-10-CM | POA: Diagnosis not present

## 2017-10-16 DIAGNOSIS — Z5181 Encounter for therapeutic drug level monitoring: Secondary | ICD-10-CM | POA: Diagnosis not present

## 2017-10-16 LAB — BASIC METABOLIC PANEL
BUN: 10 mg/dL (ref 6–23)
CALCIUM: 9.5 mg/dL (ref 8.4–10.5)
CHLORIDE: 98 meq/L (ref 96–112)
CO2: 27 mEq/L (ref 19–32)
CREATININE: 0.83 mg/dL (ref 0.40–1.20)
GFR: 86.42 mL/min (ref >60.00)
Glucose, Bld: 80 mg/dL (ref 70–99)
Potassium: 4.6 mEq/L (ref 3.5–5.1)
Sodium: 137 mEq/L (ref 135–145)

## 2017-10-16 NOTE — Patient Instructions (Signed)
Your blood pressure and pulse look good today- continue your heart meds as directed by Dr. Curt Bears I would try compression socks for your swelling- this may help with your symptoms We will check your electrolytes today since you are on a new type of fluid pill  Take care and please see me in 3-4 months

## 2017-10-26 ENCOUNTER — Telehealth: Payer: Self-pay | Admitting: Pulmonary Disease

## 2017-10-26 MED ORDER — BUDESONIDE-FORMOTEROL FUMARATE 160-4.5 MCG/ACT IN AERO: 2.0000 | INHALATION_SPRAY | Freq: Two times a day (BID) | RESPIRATORY_TRACT | 0 refills | Status: DC

## 2017-10-26 NOTE — Telephone Encounter (Signed)
Spoke with pt. She is needing a refill on Symbicort. Advised her that she is due for an appointment with Dr. Elsworth Soho. She has been scheduled for 10/30/17 at 9am. Rx has been sent in. Nothing further was needed.

## 2017-10-27 ENCOUNTER — Other Ambulatory Visit: Payer: Self-pay | Admitting: *Deleted

## 2017-10-27 MED ORDER — BUDESONIDE-FORMOTEROL FUMARATE 160-4.5 MCG/ACT IN AERO: 2.0000 | INHALATION_SPRAY | Freq: Two times a day (BID) | RESPIRATORY_TRACT | 1 refills | Status: AC

## 2017-10-30 ENCOUNTER — Ambulatory Visit: Payer: PPO | Admitting: Pulmonary Disease

## 2017-10-30 DIAGNOSIS — I872 Venous insufficiency (chronic) (peripheral): Secondary | ICD-10-CM | POA: Diagnosis not present

## 2017-10-30 DIAGNOSIS — R21 Rash and other nonspecific skin eruption: Secondary | ICD-10-CM | POA: Diagnosis not present

## 2017-10-30 DIAGNOSIS — L309 Dermatitis, unspecified: Secondary | ICD-10-CM | POA: Diagnosis not present

## 2017-10-30 DIAGNOSIS — L308 Other specified dermatitis: Secondary | ICD-10-CM | POA: Diagnosis not present

## 2017-11-09 DIAGNOSIS — L309 Dermatitis, unspecified: Secondary | ICD-10-CM | POA: Diagnosis not present

## 2017-12-05 ENCOUNTER — Telehealth: Payer: Self-pay | Admitting: Cardiology

## 2017-12-05 ENCOUNTER — Encounter: Payer: PPO | Admitting: *Deleted

## 2017-12-05 NOTE — Telephone Encounter (Signed)
Spoke with pt and reminded pt of remote transmission that is due today. Pt verbalized understanding.   

## 2017-12-06 ENCOUNTER — Encounter: Payer: Self-pay | Admitting: Cardiology

## 2017-12-15 ENCOUNTER — Ambulatory Visit: Payer: Self-pay | Admitting: *Deleted

## 2017-12-15 NOTE — Telephone Encounter (Signed)
FYI to [name].

## 2017-12-15 NOTE — Telephone Encounter (Signed)
Patient has been using her fluid pills due to swelling feet. Patient did her daily exercises that she does 2-3 times/week.  Patient saw blood when she wiped and in her panties. Patient feels that the blood is coming from her vagina. Patient saw dark discharge of blood in her panties and bright red blood when she wiped after urination. Patient has not had any more bleeding- she will monitor and report at appointment on  Monday.  Reason for Disposition . Postmenopausal vaginal bleeding  Answer Assessment - Initial Assessment Questions 1. AMOUNT: "Describe the bleeding that you are having." "How much bleeding is there?"    - SPOTTING: spotting, or pinkish / brownish mucous discharge; does not fill panti-liner or pad    - MILD:  less than 1 pad / hour; less than patient's  menstrual bleeding when she still had menstrual periods   - MODERATE: 1-2 pads / hour; small-medium blood clots (e.g., pea, grape, small coin)    - SEVERE: soaking 2 or more pads/hour for 2 or more hours; bleeding not contained by pads or continuous red blood from vagina; large blood clots (e.g., golf ball, large coin)      spotting 2. ONSET: "When did the bleeding begin?" "Is it continuing now?"     This afternoon- no 3. MENOPAUSE: "When was your last menstrual period?"      Age 72 4. ABDOMINAL PAIN: "Do you have any pain?" "How bad is the pain?"  (e.g., Scale 1-10; mild, moderate, or severe)   - MILD (1-3): doesn't interfere with normal activities, abdomen soft and not tender to touch    - MODERATE (4-7): interferes with normal activities or awakens from sleep, tender to touch    - SEVERE (8-10): excruciating pain, doubled over, unable to do any normal activities      no 5. BLOOD THINNERS: "Do you take any blood thinners?" (e.g., Coumadin/warfarin, Pradaxa/dabigatran, aspirin)     no 6. HORMONES: "Are you taking any hormone medications, prescription or OTC?" (e.g., birth control pills, estrogen)     no 7. CAUSE: "What do  you think is causing the bleeding?" (e.g., recent gyn surgery, recent gyn procedure; known bleeding disorder, uterine cancer)       no 8. HEMODYNAMIC STATUS: "Are you weak or feeling lightheaded?" If so, ask: "Can you stand and walk normally?"       Some dizziness at times 9. OTHER SYMPTOMS: "What other symptoms are you having with the bleeding?" (e.g., back pain, burning with urination, fever)     no  Protocols used: VAGINAL BLEEDING - POSTMENOPAUSAL-A-AH

## 2017-12-15 NOTE — Telephone Encounter (Signed)
Noted- pt is seeing me on Monday and we will discuss then

## 2017-12-16 NOTE — Progress Notes (Signed)
[place] at [facility] [address], [place], [place] [address] [phone]  Date:  12/18/2017   Name:  Jenna Marquez   DOB:  May 05, 1946   MRN:  [id number]  PCP:  Darreld Mclean, MD    Chief Complaint: Vaginal Bleeding (started saturday and sunday, dryness, weakness, fatigue, no lower abdominal pain); Urinary Tract Infection (frequency, no pain); and Rash (bumps on abdomen, back and chest, no itching)   History of Present Illness:  Jenna Marquez is a 72 y.o. very pleasant female patient who presents with the following:  Pt had called in last week to the [facility] with concern of post- menopausal bleeding for the last 3 days or so. She will see spots of blood in her underwear and also may notice blood when she wipes Menopause: years ago, she has not had a hysterectomy. Never had post- menopausal bleeding before  She has also noted urinary frequency but cannot tell me for how long.  No dysuria  No hematuria  She does not have a GYN any longer, will need a referral   History of smoking, COPD, CKD, HTN  She has noted a rash on her back and chest for the last couple of days. She is concerned about measles  She has noted a mild cough but otherwise no sx of acute illness  She has not had intercourse for 16 years and is very worried about having a pelvic exam today  While walking the few steps from the bathroom to her exam room pt had complaint of chest pain which she states is due to "indigestion because I have been up since 2 am and did not eat all day."  She also complains of feeling dizzy and very fatigued  Patient Active Problem List   Diagnosis Date Noted  . Left knee pain 07/11/2017  . PVD (peripheral vascular disease) ([place]) 04/04/2017  . HTN (hypertension) 11/24/2016  . Paroxysmal atrial fibrillation ([place]) 09/30/2016  . Acute respiratory failure with hypoxia ([place]) 09/30/2016  . Mobitz type 2 second degree AV block 09/30/2016   . Swelling of arm 05/22/2015  . Left shoulder pain 05/22/2015  . Chest pain on breathing 05/11/2015  . CKD (chronic kidney disease) stage 3, GFR 30-59 ml/min ([facility]) 05/11/2015  . Pacemaker 04/22/2015  . Generalized anxiety disorder 01/11/2012  . 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   . Tobacco abuse     Past Medical History:  Diagnosis Date  . Allergic rhinitis   . Anxiety    "occasionally"  . Asthma   . COPD (chronic obstructive pulmonary disease) ([place])   . GERD (gastroesophageal reflux disease)   . Mobitz type 2 second degree AV block 01/06/2015    Past Surgical History:  Procedure Laterality Date  . [place]  . EP IMPLANTABLE DEVICE N/A 01/07/2015   Procedure: Pacemaker Implant;  Surgeon: Evans Lance, MD;  Location: [facility];  Service: Cardiovascular;  Laterality: N/A;  . PACEMAKER INSERTION    . TONSILLECTOMY AND ADENOIDECTOMY  1954    Social History   Tobacco Use  . Smoking status: Former Smoker    Packs/day: 1.00    Years: 35.00    Pack years: 35.00    Types: Cigarettes    Last attempt to quit: 08/10/2012    Years since quitting: 5.3  . Smokeless tobacco: Never Used  . Tobacco comment: Started at age 61  Substance Use Topics  .  Alcohol use: Yes    Alcohol/week: 16.8 oz    Types: 28 Shots of liquor per week    Comment: 01/06/2015 "2 shot mixed drinks, 2/day"  . Drug use: No    Family History  Problem Relation Age of Onset  . Coronary artery disease Mother   . Heart disease Mother        CHF  . Diabetes Neg Hx   . Cancer Neg Hx   . Stroke Neg Hx   . Colon cancer Neg Hx     Allergies  Allergen Reactions  . Avelox [Moxifloxacin Hcl In Nacl] Itching and Rash    Medication list has been reviewed and updated.  Current Outpatient Medications on File Prior to Visit  Medication Sig Dispense Refill  . albuterol (PROVENTIL HFA;VENTOLIN HFA) 108 (90 Base) MCG/ACT inhaler Inhale 2 puffs into the  lungs every 6 (six) hours as needed for wheezing or shortness of breath. 1 Inhaler 2  . albuterol (PROVENTIL) (2.5 MG/3ML) 0.083% nebulizer solution Take 3 mLs (2.5 mg total) by nebulization every 6 (six) hours as needed for wheezing or shortness of breath. 150 mL 0  . ALPRAZolam (XANAX) 0.5 MG tablet Take 1 tablet (0.5 mg total) by mouth every 8 (eight) hours as needed for anxiety (**DO NOT TAKE AT NIGHT WITH HYDROXYZINE**). Do not take with alcohol 15 tablet 0  . aspirin EC 81 MG EC tablet Take 1 tablet (81 mg total) by mouth daily. 30 tablet 0  . budesonide-formoterol (SYMBICORT) 160-4.5 MCG/ACT inhaler Inhale 2 puffs into the lungs 2 (two) times daily. 3 Inhaler 1  . calcium-vitamin D (OSCAL WITH D) 250-125 MG-UNIT tablet Take 1 tablet by mouth daily.    . Cholecalciferol (VITAMIN D) 2000 UNITS CAPS Take 1 capsule by mouth daily.    . hydrochlorothiazide (HYDRODIURIL) 25 MG tablet Take 1/2 or 1 daily as needed for swelling of legs 30 tablet 3  . hydrOXYzine (ATARAX/VISTARIL) 10 MG tablet TAKE 2 TABLETS BY MOUTH AS NEEDED FOR ITCHING. DO NOT TAKE WITH ALPRAZOLAM 60 tablet 0  . ibuprofen (ADVIL,MOTRIN) 800 MG tablet Take 1 tablet (800 mg total) by mouth 3 (three) times daily. 30 tablet 0  . ipratropium (ATROVENT) 0.02 % nebulizer solution Take 2.5 mLs (0.5 mg total) by nebulization every 6 (six) hours as needed for wheezing or shortness of breath. 75 mL 0  . metoprolol tartrate (LOPRESSOR) 25 MG tablet Take 1 tablet (25 mg total) by mouth 2 (two) times daily. 180 tablet 2  . Multiple Vitamin (MULTIVITAMIN) tablet Take 2 tablets by mouth every morning.     . triamcinolone ointment (KENALOG) 0.1 % APPLY TO AFFECTED AREA TWICE A DAY  0   No current facility-administered medications on file prior to visit.     Review of Systems:  As per HPI- otherwise negative.   Physical Examination: Vitals:   12/18/17 1437  BP: 121/67  Pulse: 88  Resp: 16  Temp: 97.6 F (36.4 C)  SpO2: 94%   Vitals:    12/18/17 1437  Weight: 140 lb 6.4 oz (63.7 kg)  Height: 5' 6.5" (1.689 m)   Body mass index is 22.32 kg/m. Ideal Body Weight: Weight in (lb) to have BMI = 25: 156.9  GEN: WDWN, NAD, Non-toxic, A & O x 3, normal weight.  Elderly appearing woman HEENT: Atraumatic, Normocephalic. Neck supple. No masses, No LAD.  Bilateral TM wnl, oropharynx normal.  PEERL,EOMI.   Ears and Nose: No external deformity. CV: RRR, No M/G/R. No  JVD. No thrill. No extra heart sounds. PULM: CTA B, no wheezes, crackles, rhonchi. No retractions. No resp. distress. No accessory muscle use. ABD: S, NT, ND, +BS. No rebound. No HSM. EXTR: No c/c/e NEURO Normal gait.  PSYCH: Normally interactive. Conversant. Not depressed or anxious appearing.  Calm demeanor.  Gu: was able to introduce spec into just the vaginal introitus and confirm that the bleeding is coming from her vagina.  Pt could not tolerate further exam  She has a fine, non- petechial rash over her trunk- front and back.  Does not appear c/w measles.  ?viral rash of some sort.  Palms and soles, face, arms spared   As above, on the way back from attempting to give a urine sample pt started clutching her RIGHT chest and complaining that her chest hurt due to what she says is indigestion Pt tried to give a urine sample several times, and was given multiple cups of water.   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  EKG is abnormal  Assessment and Plan: Urinary frequency - Plan: CANCELED: POCT Urinalysis Dipstick (Automated)  Postmenopausal bleeding - Plan: Ambulatory referral to Obstetrics / Gynecology, CANCELED: POCT Urinalysis Dipstick (Automated)  Chest pain, unspecified type - Plan: EKG 12-Lead  Rash - Plan: [place] Antibody, IGM, Measles/Mumps/Rubella Immunity, CBC, Comprehensive metabolic panel  Here today with multiple complaints and also chest pain which just began while she  was in the office.  We will refer to GYN for her post- menopausal bleeding which certainly needs evaluation.  Do not think rash is measles but as there is a [place] outbreak will obtain labs to be sure Explained to pt that I am very concerned about her CP, rash, and complaint of dizziness and fatigue.  Several times I recommended that she go to the ER today for further evaluation.  Pt refused each time.  After spending 2 hours in clinic she took a urine cup home to collect a sample there. I spent over 60 minutes face to face with her today.  Despite my best efforts I am not able to convince her to go to the ER and she went home against my advice.  She is of sound mind and able to make this decision for herself today   Signed Lamar Blinks, MD

## 2017-12-18 ENCOUNTER — Encounter: Payer: Self-pay | Admitting: Family Medicine

## 2017-12-18 ENCOUNTER — Ambulatory Visit (INDEPENDENT_AMBULATORY_CARE_PROVIDER_SITE_OTHER): Payer: PPO | Admitting: Family Medicine

## 2017-12-18 VITALS — BP 121/67 | HR 88 | Temp 97.6°F | Resp 16 | Ht 66.5 in | Wt 140.4 lb

## 2017-12-18 DIAGNOSIS — R21 Rash and other nonspecific skin eruption: Secondary | ICD-10-CM | POA: Diagnosis not present

## 2017-12-18 DIAGNOSIS — R079 Chest pain, unspecified: Secondary | ICD-10-CM | POA: Diagnosis not present

## 2017-12-18 DIAGNOSIS — R350 Frequency of micturition: Secondary | ICD-10-CM | POA: Diagnosis not present

## 2017-12-18 DIAGNOSIS — N950 Postmenopausal bleeding: Secondary | ICD-10-CM

## 2017-12-18 NOTE — Patient Instructions (Signed)
Please bring [place] a urine sample tomorrow, and we will get labs for you today I do not think that you have measles but we will obtain labs to make sure Also, we will get labs to look for any cause of your rash and will be in touch with your reports asap

## 2017-12-19 ENCOUNTER — Emergency Department (HOSPITAL_BASED_OUTPATIENT_CLINIC_OR_DEPARTMENT_OTHER)
Admission: EM | Admit: 2017-12-19 | Discharge: 2017-12-19 | Disposition: A | Discharge status: Home / Self Care | Payer: PPO | Attending: Emergency Medicine | Admitting: Emergency Medicine

## 2017-12-19 ENCOUNTER — Other Ambulatory Visit: Payer: Self-pay

## 2017-12-19 ENCOUNTER — Encounter (HOSPITAL_BASED_OUTPATIENT_CLINIC_OR_DEPARTMENT_OTHER): Payer: Self-pay | Admitting: *Deleted

## 2017-12-19 ENCOUNTER — Emergency Department (HOSPITAL_BASED_OUTPATIENT_CLINIC_OR_DEPARTMENT_OTHER): Payer: PPO

## 2017-12-19 ENCOUNTER — Telehealth: Payer: Self-pay | Admitting: *Deleted

## 2017-12-19 DIAGNOSIS — I129 Hypertensive chronic kidney disease with stage 1 through stage 4 chronic kidney disease, or unspecified chronic kidney disease: Secondary | ICD-10-CM | POA: Insufficient documentation

## 2017-12-19 DIAGNOSIS — R531 Weakness: Secondary | ICD-10-CM | POA: Insufficient documentation

## 2017-12-19 DIAGNOSIS — J449 Chronic obstructive pulmonary disease, unspecified: Secondary | ICD-10-CM | POA: Diagnosis not present

## 2017-12-19 DIAGNOSIS — N183 Chronic kidney disease, stage 3 (moderate): Secondary | ICD-10-CM | POA: Diagnosis not present

## 2017-12-19 DIAGNOSIS — Z87891 Personal history of nicotine dependence: Secondary | ICD-10-CM | POA: Insufficient documentation

## 2017-12-19 DIAGNOSIS — R05 Cough: Secondary | ICD-10-CM | POA: Diagnosis not present

## 2017-12-19 DIAGNOSIS — Z79899 Other long term (current) drug therapy: Secondary | ICD-10-CM | POA: Insufficient documentation

## 2017-12-19 DIAGNOSIS — Z7982 Long term (current) use of aspirin: Secondary | ICD-10-CM | POA: Insufficient documentation

## 2017-12-19 DIAGNOSIS — R42 Dizziness and giddiness: Secondary | ICD-10-CM | POA: Diagnosis not present

## 2017-12-19 DIAGNOSIS — N3000 Acute cystitis without hematuria: Secondary | ICD-10-CM | POA: Diagnosis not present

## 2017-12-19 DIAGNOSIS — Z950 Presence of cardiac pacemaker: Secondary | ICD-10-CM | POA: Insufficient documentation

## 2017-12-19 DIAGNOSIS — I480 Paroxysmal atrial fibrillation: Secondary | ICD-10-CM | POA: Insufficient documentation

## 2017-12-19 LAB — CBC WITH DIFFERENTIAL/PLATELET
Basophils Absolute: 0 10*3/uL (ref 0.0–0.1)
Basophils Relative: 1 %
EOS ABS: 0 10*3/uL (ref 0.0–0.7)
Eosinophils Relative: 1 %
HCT: 34.7 % — ABNORMAL LOW (ref 36.0–46.0)
HEMOGLOBIN: 12.8 g/dL (ref 12.0–15.0)
LYMPHS ABS: 1.2 10*3/uL (ref 0.7–4.0)
Lymphocytes Relative: 18 %
MCH: 37.9 pg — AB (ref 26.0–34.0)
MCHC: 36.9 g/dL — AB (ref 30.0–36.0)
MCV: 102.7 fL — ABNORMAL HIGH (ref 78.0–100.0)
MONOS PCT: 16 %
Monocytes Absolute: 1 10*3/uL (ref 0.1–1.0)
NEUTROS PCT: 64 %
Neutro Abs: 4.3 10*3/uL (ref 1.7–7.7)
Platelets: 292 10*3/uL (ref 150–400)
RBC: 3.38 MIL/uL — ABNORMAL LOW (ref 3.87–5.11)
RDW: 13 % (ref 11.5–15.5)
WBC: 6.6 10*3/uL (ref 4.0–10.5)

## 2017-12-19 LAB — COMPREHENSIVE METABOLIC PANEL
ALBUMIN: 3.3 g/dL — AB (ref 3.5–5.2)
ALT: 29 U/L (ref 0–35)
AST: 97 U/L — ABNORMAL HIGH (ref 0–37)
Alkaline Phosphatase: 117 U/L (ref 39–117)
BUN: 9 mg/dL (ref 6–23)
CHLORIDE: 88 meq/L — AB (ref 96–112)
CO2: 21 mEq/L (ref 19–32)
Calcium: 8.5 mg/dL (ref 8.4–10.5)
Creatinine, Ser: 0.73 mg/dL (ref 0.40–1.20)
GFR: 100.18 mL/min (ref >60.00)
Glucose, Bld: 28 mg/dL — CL (ref 70–99)
POTASSIUM: 4.3 meq/L (ref 3.5–5.1)
SODIUM: 131 meq/L — AB (ref 135–145)
Total Bilirubin: 0.8 mg/dL (ref 0.2–1.2)
Total Protein: 6.7 g/dL (ref 6.0–8.3)

## 2017-12-19 LAB — BASIC METABOLIC PANEL
Anion gap: 18 — ABNORMAL HIGH (ref 5–15)
BUN: 12 mg/dL (ref 6–20)
CHLORIDE: 89 mmol/L — AB (ref 101–111)
CO2: 21 mmol/L — AB (ref 22–32)
CREATININE: 0.91 mg/dL (ref 0.44–1.00)
Calcium: 7.7 mg/dL — ABNORMAL LOW (ref 8.9–10.3)
GFR calc Af Amer: >60 mL/min (ref >60)
GFR calc non Af Amer: >60 mL/min (ref >60)
GLUCOSE: 105 mg/dL — AB (ref 65–99)
Potassium: 4.2 mmol/L (ref 3.5–5.1)
SODIUM: 128 mmol/L — AB (ref 135–145)

## 2017-12-19 LAB — URINALYSIS, MICROSCOPIC (REFLEX)

## 2017-12-19 LAB — URINALYSIS, ROUTINE W REFLEX MICROSCOPIC
Bilirubin Urine: NEGATIVE
GLUCOSE, UA: NEGATIVE mg/dL
Ketones, ur: NEGATIVE mg/dL
Nitrite: NEGATIVE
PH: 6 (ref 5.0–8.0)
Protein, ur: NEGATIVE mg/dL
SPECIFIC GRAVITY, URINE: 1.015 (ref 1.005–1.030)

## 2017-12-19 LAB — I-STAT CG4 LACTIC ACID, ED: Lactic Acid, Venous: 2.72 mmol/L (ref 0.5–1.9)

## 2017-12-19 LAB — CBC
HEMATOCRIT: 38.3 % (ref 36.0–46.0)
HEMOGLOBIN: 12.7 g/dL (ref 12.0–15.0)
MCHC: 33 g/dL (ref 30.0–36.0)
Platelets: 321 10*3/uL (ref 150.0–400.0)
RBC: 3.44 Mil/uL — AB (ref 3.87–5.11)
RDW: 15.2 % (ref 11.5–15.5)
WBC: 7.3 10*3/uL (ref 4.0–10.5)

## 2017-12-19 LAB — TROPONIN I

## 2017-12-19 MED ORDER — ALBUTEROL (5 MG/ML) CONTINUOUS INHALATION SOLN
INHALATION_SOLUTION | RESPIRATORY_TRACT | Status: AC
  Administered 2017-12-19: 3 mg/h
  Filled 2017-12-19: qty 20

## 2017-12-19 MED ORDER — SODIUM CHLORIDE 0.9 % IV BOLUS
1000.0000 mL | Freq: Once | INTRAVENOUS | Status: AC
  Administered 2017-12-19: 1000 mL via INTRAVENOUS

## 2017-12-19 MED ORDER — PREDNISONE 20 MG PO TABS: ORAL_TABLET | ORAL | 0 refills | Status: DC

## 2017-12-19 MED ORDER — IPRATROPIUM BROMIDE 0.02 % IN SOLN
RESPIRATORY_TRACT | Status: AC
  Administered 2017-12-19: 0.5 mg
  Filled 2017-12-19: qty 2.5

## 2017-12-19 MED ORDER — CEPHALEXIN 500 MG PO CAPS: 500.0000 mg | ORAL_CAPSULE | Freq: Four times a day (QID) | ORAL | 0 refills | Status: DC

## 2017-12-19 NOTE — Telephone Encounter (Signed)
Pt is now in the ER, repeat glucose was normal

## 2017-12-19 NOTE — ED Notes (Signed)
ED Provider at bedside. 

## 2017-12-19 NOTE — ED Provider Notes (Signed)
[facility] EMERGENCY DEPARTMENT Provider Note   CSN: [id number] Arrival date & time: 12/19/17  0725     History   Chief Complaint Chief Complaint  Patient presents with  . Dizziness    HPI Jenna Marquez is a 72 y.o. female.  72 yo F with a chief complaint of lightheadedness upon standing.  This been going on for about a month and a half.  Patient feels that every time she stands up she feels that she may pass out and has to wait for about 15 seconds and is able to ambulate as normal.  She denies any chest pain or shortness of breath.  She saw her family physician yesterday and she was concerned that maybe she had measles and has been coughing and congested.  Labs were ordered and sent off including a urine because she has been having some urinary frequency.  These labs have not yet resulted.  Since then the patient has not felt that anything is changed.  Patient's records were reviewed and most recent visit patient was complaining of some chest discomfort.  She denies this on my exam.  The history is provided by the patient and a relative.  Dizziness  Associated symptoms: no chest pain, no headaches, no nausea, no palpitations, no shortness of breath and no vomiting   Illness  This is a new problem. The current episode started more than 1 week ago. The problem occurs constantly. The problem has been gradually worsening. Pertinent negatives include no chest pain, no abdominal pain, no headaches and no shortness of breath. Nothing aggravates the symptoms. Nothing relieves the symptoms. She has tried nothing for the symptoms. The treatment provided no relief.    Past Medical History:  Diagnosis Date  . Allergic rhinitis   . Anxiety    "occasionally"  . Asthma   . COPD (chronic obstructive pulmonary disease) ([place])   . GERD (gastroesophageal reflux disease)   . 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  Left knee pain 07/11/2017  . PVD (peripheral vascular disease) ([place]) 04/04/2017  . HTN (hypertension) 11/24/2016  . Paroxysmal atrial fibrillation ([place]) 09/30/2016  . Acute respiratory failure with hypoxia ([place]) 09/30/2016  . Mobitz type 2 second degree AV block 09/30/2016  . Swelling of arm 05/22/2015  . Left shoulder pain 05/22/2015  . Chest pain on breathing 05/11/2015  . CKD (chronic kidney disease) stage 3, GFR 30-59 ml/min ([facility]) 05/11/2015  . Pacemaker 04/22/2015  . Generalized anxiety disorder 01/11/2012  . 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   . Tobacco abuse     Past Surgical History:  Procedure Laterality Date  . [place]  . EP IMPLANTABLE DEVICE N/A 01/07/2015   Procedure: Pacemaker Implant;  Surgeon: Evans Lance, MD;  Location: [facility];  Service: Cardiovascular;  Laterality: N/A;  . PACEMAKER INSERTION    . TONSILLECTOMY AND ADENOIDECTOMY  1954     OB History   None      Home Medications    Prior to Admission medications   Medication Sig Start Date End Date Taking? Authorizing Provider  albuterol (PROVENTIL HFA;VENTOLIN HFA) 108 (90 Base) MCG/ACT inhaler Inhale 2 puffs into the lungs every 6 (six) hours as needed for wheezing or shortness of breath. 10/02/16   Nita Sells, MD  albuterol (PROVENTIL) (2.5 MG/3ML) 0.083% nebulizer solution Take 3 mLs (2.5 mg total) by  nebulization every 6 (six) hours as needed for wheezing or shortness of breath. 03/31/17   [place]land, Gay Filler, MD  ALPRAZolam Duanne Moron) 0.5 MG tablet Take 1 tablet (0.5 mg total) by mouth every 8 (eight) hours as needed for anxiety (**DO NOT TAKE AT NIGHT WITH HYDROXYZINE**). Do not take with alcohol 07/04/17   Saguier, Percell Miller, PA-C  aspirin EC 81 MG EC tablet Take 1 tablet (81 mg total) by mouth daily. 10/02/16   Nita Sells, MD  budesonide-formoterol (SYMBICORT) 160-4.5 MCG/ACT inhaler Inhale 2 puffs into the lungs 2 (two) times  daily. 10/27/17   Parrett, Fonnie Mu, NP  calcium-vitamin D (OSCAL WITH D) 250-125 MG-UNIT tablet Take 1 tablet by mouth daily.    [provider]  cephALEXin (KEFLEX) 500 MG capsule Take 1 capsule (500 mg total) by mouth 4 (four) times daily. 12/19/17   Deno Etienne, DO  Cholecalciferol (VITAMIN D) 2000 UNITS CAPS Take 1 capsule by mouth daily.    [provider]  hydrochlorothiazide (HYDRODIURIL) 25 MG tablet Take 1/2 or 1 daily as needed for swelling of legs 10/03/17   [place]land, Gay Filler, MD  hydrOXYzine (ATARAX/VISTARIL) 10 MG tablet TAKE 2 TABLETS BY MOUTH AS NEEDED FOR ITCHING. DO NOT TAKE WITH ALPRAZOLAM 08/04/17   Saguier, Percell Miller, PA-C  ibuprofen (ADVIL,MOTRIN) 800 MG tablet Take 1 tablet (800 mg total) by mouth 3 (three) times daily. 08/31/17   [place]land, Gay Filler, MD  ipratropium (ATROVENT) 0.02 % nebulizer solution Take 2.5 mLs (0.5 mg total) by nebulization every 6 (six) hours as needed for wheezing or shortness of breath. 03/31/17   [place]land, Gay Filler, MD  metoprolol tartrate (LOPRESSOR) 25 MG tablet Take 1 tablet (25 mg total) by mouth 2 (two) times daily. 10/10/17 01/08/18  Camnitz, Ocie Doyne, MD  Multiple Vitamin (MULTIVITAMIN) tablet Take 2 tablets by mouth every morning.     [provider]  predniSONE (DELTASONE) 20 MG tablet 2 tabs po daily x 4 days 12/19/17   Deno Etienne, DO  triamcinolone ointment (KENALOG) 0.1 % APPLY TO AFFECTED AREA TWICE A DAY 07/10/17   [provider]    Family History Family History  Problem Relation Age of Onset  . Coronary artery disease Mother   . Heart disease Mother        CHF  . Diabetes Neg Hx   . Cancer Neg Hx   . Stroke Neg Hx   . Colon cancer Neg Hx     Social History Social History   Tobacco Use  . Smoking status: Former Smoker    Packs/day: 1.00    Years: 35.00    Pack years: 35.00    Types: Cigarettes    Last attempt to quit: 08/10/2012    Years since quitting: 5.3  . Smokeless tobacco: Never Used    . Tobacco comment: Started at age 54  Substance Use Topics  . Alcohol use: Yes    Alcohol/week: 16.8 oz    Types: 28 Shots of liquor per week    Comment: 01/06/2015 "2 shot mixed drinks, 2/day"  . Drug use: No     Allergies   Avelox [moxifloxacin hcl in nacl]   Review of Systems Review of Systems  Constitutional: Negative for chills and fever.  HENT: Negative for congestion and rhinorrhea.   Eyes: Negative for redness and visual disturbance.  Respiratory: Negative for shortness of breath and wheezing.   Cardiovascular: Negative for chest pain and palpitations.  Gastrointestinal: Negative for abdominal pain, nausea and vomiting.  Genitourinary: Positive for decreased urine volume. Negative for dysuria and urgency.  Musculoskeletal: Negative for arthralgias and myalgias.  Skin: Negative for pallor and wound.  Neurological: Positive for dizziness. Negative for headaches.     Physical Exam Updated Vital Signs BP (!) 149/74 (BP Location: Left Arm)   Pulse (!) 118   Temp (!) 97.5 F (36.4 C) (Oral)   Resp 16   SpO2 95%   Physical Exam  Constitutional: She is oriented to person, place, and time. She appears well-developed and well-nourished. No distress.  HENT:  Head: Normocephalic and atraumatic.  Eyes: Pupils are equal, round, and reactive to light. EOM are normal.  Neck: Normal range of motion. Neck supple.  Cardiovascular: Normal rate and regular rhythm. Exam reveals no gallop and no friction rub.  No murmur heard. Pulmonary/Chest: Effort normal. She has no wheezes. She has no rales.  Abdominal: Soft. She exhibits no distension and no mass. There is no tenderness. There is no guarding.  Musculoskeletal: She exhibits no edema or tenderness.  Neurological: She is alert and oriented to person, place, and time.  Skin: Skin is warm and dry. She is not diaphoretic.  Psychiatric: She has a normal mood and affect. Her behavior is normal.  Nursing note and vitals  reviewed.    ED Treatments / Results  Labs (all labs ordered are listed, but only abnormal results are displayed) Labs Reviewed  CBC WITH DIFFERENTIAL/PLATELET - Abnormal; Notable for the following components:      Result Value   RBC 3.38 (*)    HCT 34.7 (*)    MCV 102.7 (*)    MCH 37.9 (*)    MCHC 36.9 (*)    All other components within normal limits  BASIC METABOLIC PANEL - Abnormal; Notable for the following components:   Sodium 128 (*)    Chloride 89 (*)    CO2 21 (*)    Glucose, Bld 105 (*)    Calcium 7.7 (*)    Anion gap 18 (*)    All other components within normal limits  URINALYSIS, ROUTINE W REFLEX MICROSCOPIC - Abnormal; Notable for the following components:   Hgb urine dipstick MODERATE (*)    Leukocytes, UA MODERATE (*)    All other components within normal limits  URINALYSIS, MICROSCOPIC (REFLEX) - Abnormal; Notable for the following components:   Bacteria, UA FEW (*)    All other components within normal limits  I-STAT CG4 LACTIC ACID, ED - Abnormal; Notable for the following components:   Lactic Acid, Venous 2.72 (*)    All other components within normal limits  TROPONIN I  I-STAT CG4 LACTIC ACID, ED    EKG EKG Interpretation  Date/Time:  Tuesday Dec 19 2017 07:42:34 EDT Ventricular Rate:  96 PR Interval:    QRS Duration: 148 QT Interval:  380 QTC Calculation: 481 R Axis:   48 Text Interpretation:  Sinus rhythm Atrial premature complexes in couplets Right bundle branch block Artifact in lead(s) I II aVR aVL aVF V1 No significant change since last tracing Confirmed by Deno Etienne [phone]) on 12/19/2017 8:16:31 AM Also confirmed by Deno Etienne [phone]), editor Philomena Doheny [phone])  on 12/19/2017 11:12:55 AM   Radiology Dg Chest 2 View  Result Date: 12/19/2017 CLINICAL DATA:  Cough and dizziness EXAM: CHEST - 2 VIEW COMPARISON:  08/30/2017 FINDINGS: Cardiac shadow is stable. Pacing device is again seen and stable. The lungs are hyperaerated consistent with  COPD. Bibasilar atelectatic changes are noted. No focal infiltrate or  effusion is seen. IMPRESSION: COPD and mild bibasilar atelectasis. Electronically Signed   By: Inez Catalina M.D.   On: 12/19/2017 11:29    Procedures Procedures (including critical care time)  Medications Ordered in ED Medications  albuterol (PROVENTIL, VENTOLIN) (5 MG/ML) 0.5% continuous inhalation solution (3 mg/hr  Given 12/19/17 0743)  ipratropium (ATROVENT) 0.02 % nebulizer solution (0.5 mg  Given 12/19/17 0743)  sodium chloride 0.9 % bolus 1,000 mL (0 mLs Intravenous Stopped 12/19/17 1401)  sodium chloride 0.9 % bolus 1,000 mL (0 mLs Intravenous Stopped 12/19/17 1424)     Initial Impression / Assessment and Plan / ED Course  I have reviewed the triage vital signs and the nursing notes.  Pertinent labs & imaging results that were available during my care of the patient were reviewed by me and considered in my medical decision making (see chart for details).     72 yo F with a chief complaint of weakness and lightheadedness upon standing.  This been going on for the past month and a half.  She saw her family doctor yesterday and was concerned that maybe she had the measles because she had a fine rash breakout on her body.  She has been having some mild cough and decreased urine as well.  Her hemoglobin is stable.  Her metabolic panel has an elevated anion gap.  She has had some decreased urine output and so I suspect that this likely ketosis.  Will obtain a lactate and urine give another bag of fluids and ambulate.  The patient's lactic acid is elevated.  I wonder if this is the breathing treatments that she had prior to arrival.  The patient is feeling significantly better.  Will obtain a chest x-ray and urine to evaluate for possible underlying infection.  UA with possible uti.  CXR viewed by me and negative for pna.  She had no ketones in her urine which means that her anion gap was likely due to lactic acidosis.  The  patient is feeling better but still having trouble ambulating on her own in the emergency department.  I offered admission to her which she is currently declining.  I offered her to come back at any time which point she would likely need admission for fatigue and a possible urinary tract infection.  We will start her on Keflex.  3:51 PM:  I have discussed the diagnosis/risks/treatment options with the patient and family and believe the pt to be eligible for discharge home to follow-up with PCP. We also discussed returning to the ED immediately if new or worsening sx occur. We discussed the sx which are most concerning (e.g., sudden worsening pain, fever, inability to tolerate by mouth) that necessitate immediate return. Medications administered to the patient during their visit and any new prescriptions provided to the patient are listed below.  Medications given during this visit Medications  albuterol (PROVENTIL, VENTOLIN) (5 MG/ML) 0.5% continuous inhalation solution (3 mg/hr  Given 12/19/17 0743)  ipratropium (ATROVENT) 0.02 % nebulizer solution (0.5 mg  Given 12/19/17 0743)  sodium chloride 0.9 % bolus 1,000 mL (0 mLs Intravenous Stopped 12/19/17 1401)  sodium chloride 0.9 % bolus 1,000 mL (0 mLs Intravenous Stopped 12/19/17 1424)   The patient appears reasonably screen and/or stabilized for discharge and I doubt any other medical condition or other [facility] requiring further screening, evaluation, or treatment in the ED at this time prior to discharge.      Final Clinical Impressions(s) / ED Diagnoses   Final diagnoses:  Dizziness  Weakness  Acute cystitis without hematuria    ED Discharge Orders        Ordered    predniSONE (DELTASONE) 20 MG tablet     12/19/17 1436    cephALEXin (KEFLEX) 500 MG capsule  4 times daily     12/19/17 [place], Kimi Bordeau, DO 12/19/17 1551

## 2017-12-19 NOTE — Telephone Encounter (Signed)
CRITICAL VALUE STICKER  CRITICAL VALUE:  Glucose 28  RECEIVER (on-site recipient of call): Kelle Darting, [place] NOTIFIED: 12/19/17 @11 :40am  MESSENGER (representative from lab): Saa  MD NOTIFIED: Debbrah Alar, NP  TIME OF NOTIFICATION: 11:46am  RESPONSE:  Upon chart review, pt being evaluated in ER today. Glucose in ED is 105. Covering Provider reviewed and recommends routing information to PCP (Dr [name]) to determine if further workup is warranted.

## 2017-12-19 NOTE — ED Notes (Signed)
Per sitter, patient drinks Theadora Rama 2 glasses a day since she gets out of college in 1968.

## 2017-12-19 NOTE — ED Triage Notes (Signed)
Pt reports dizzyness and vag bleeding x Friday. Seen by her pcp on Friday, told to come to ed for eval, pt states she could not come until now. Pt noted with occassional deep, congested cough, rt at bedside for eval. Pt states she feels sob at times, denies any cp. ekg in progress.

## 2017-12-20 NOTE — Progress Notes (Signed)
[place] at [facility] [address] [place], [place] [address] [phone]  Date:  12/21/2017   Name:  Jenna Marquez   DOB:  10/25/1945   MRN:  [id number]  PCP:  Darreld Mclean, MD    Chief Complaint: ER follow up (weakness, fatigue, lightheadedness, acute cystitis-bleeding stopped)   History of Present Illness:  Jenna Marquez is a 72 y.o. very pleasant female patient who presents with the following: Today is Thursday.  She was seen by myself on Monday of this week with a multitude of sx and I recommended that she go to the ER.  She declined, but then ended up in the ER the next day as follows: 72 yo F with a chief complaint of weakness and lightheadedness upon standing.  This been going on for the past month and a half.  She saw her family doctor yesterday and was concerned that maybe she had the measles because she had a fine rash breakout on her body.  She has been having some mild cough and decreased urine as well.  Her hemoglobin is stable.  Her metabolic panel has an elevated anion gap.  She has had some decreased urine output and so I suspect that this likely ketosis.  Will obtain a lactate and urine give another bag of fluids and ambulate.  The patient's lactic acid is elevated.  I wonder if this is the breathing treatments that she had prior to arrival.  The patient is feeling significantly better.  Will obtain a chest x-ray and urine to evaluate for possible underlying infection.  UA with possible uti.  CXR viewed by me and negative for pna.  She had no ketones in her urine which means that her anion gap was likely due to lactic acidosis.  The patient is feeling better but still having trouble ambulating on her own in the emergency department.  I offered admission to her which she is currently declining.  I offered her to come back at any time which point she would likely need admission for fatigue and a possible urinary tract  infection.  We will start her on Keflex.  3:51 PM:  I have discussed the diagnosis/risks/treatment options with the patient and family and believe the pt to be eligible for discharge home to follow-up with PCP. We also discussed returning to the ED immediately if new or worsening sx occur. We discussed the sx which are most concerning (e.g., sudden worsening pain, fever, inability to tolerate by mouth) that necessitate immediate return. Medications administered to the patient during their visit and any new prescriptions provided to the patient are listed below.  Medications given during this visit Medications  albuterol (PROVENTIL, VENTOLIN) (5 MG/ML) 0.5% continuous inhalation solution (3 mg/hr  Given 12/19/17 0743)  ipratropium (ATROVENT) 0.02 % nebulizer solution (0.5 mg  Given 12/19/17 0743)  sodium chloride 0.9 % bolus 1,000 mL (0 mLs Intravenous Stopped 12/19/17 1401)  sodium chloride 0.9 % bolus 1,000 mL (0 mLs Intravenous Stopped 12/19/17 1424)    predniSONE (DELTASONE) 20 MG tablet     12/19/17 1436     cephALEXin (KEFLEX) 500 MG capsule  4 times daily     12/19/17 1438    The patient appears reasonably screen and/or stabilized for discharge and I doubt any other medical condition or other [facility] requiring further screening, evaluation, or treatment in the ED at this time prior to discharge.   Lab Results  Component Value  Date   WBC 6.6 12/19/2017   HGB 12.8 12/19/2017   HCT 34.7 (L) 12/19/2017   MCV 102.7 (H) 12/19/2017   PLT 292 12/19/2017   Noted that her MCV is quite high and that she has hyponatremia- suspect etoh abuse is possible.  However pt denies any drinking recently  She is on keflex QID for 10 days for UTI - this seems to be causing diarrhea.  Urine culture is still pending.  Advised her to decrease to BID dosing for one week  She was given IVF, breathing treatment   She notes that she feels "100% better. My strength is coming back, I can walk without the assistance  of someone else."  She is eating some   Her rash is much better  We did an MMR titer and she is immune to all - assured her that she does not appear to have measles   She does not remember getting measles as a child but did get an MMR vaccine at school       Chemistry      Component Value Date/Time   NA 128 (L) 12/19/2017 0743   K 4.2 12/19/2017 0743   CL 89 (L) 12/19/2017 0743   CO2 21 (L) 12/19/2017 0743   BUN 12 12/19/2017 0743   CREATININE 0.91 12/19/2017 0743      Component Value Date/Time   CALCIUM 7.7 (L) 12/19/2017 0743   ALKPHOS 117 12/18/2017 1658   AST 97 (H) 12/18/2017 1658   ALT 29 12/18/2017 1658   BILITOT 0.8 12/18/2017 1658      Patient Active Problem List   Diagnosis Date Noted  . Left knee pain 07/11/2017  . PVD (peripheral vascular disease) ([place]) 04/04/2017  . HTN (hypertension) 11/24/2016  . Paroxysmal atrial fibrillation ([place]) 09/30/2016  . Acute respiratory failure with hypoxia ([place]) 09/30/2016  . Mobitz type 2 second degree AV block 09/30/2016  . Swelling of arm 05/22/2015  . Left shoulder pain 05/22/2015  . Chest pain on breathing 05/11/2015  . CKD (chronic kidney disease) stage 3, GFR 30-59 ml/min (HCC) 05/11/2015  . Pacemaker 04/22/2015  . Generalized anxiety disorder 01/11/2012  . 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   . Tobacco abuse     Past Medical History:  Diagnosis Date  . Allergic rhinitis   . Anxiety    "occasionally"  . Asthma   . COPD (chronic obstructive pulmonary disease) ([place])   . GERD (gastroesophageal reflux disease)   . Mobitz type 2 second degree AV block 01/06/2015    Past Surgical History:  Procedure Laterality Date  . [place]  . EP IMPLANTABLE DEVICE N/A 01/07/2015   Procedure: Pacemaker Implant;  Surgeon: Evans Lance, MD;  Location: [facility];  Service: Cardiovascular;  Laterality: N/A;  . PACEMAKER INSERTION    . TONSILLECTOMY AND ADENOIDECTOMY  1954     Social History   Tobacco Use  . Smoking status: Former Smoker    Packs/day: 1.00    Years: 35.00    Pack years: 35.00    Types: Cigarettes    Last attempt to quit: 08/10/2012    Years since quitting: 5.3  . Smokeless tobacco: Never Used  . Tobacco comment: Started at age 29  Substance Use Topics  . Alcohol use: Yes    Alcohol/week: 16.8 oz    Types: 28 Shots of liquor per week    Comment: 01/06/2015 "2 shot mixed drinks, 2/day"  . Drug  use: No    Family History  Problem Relation Age of Onset  . Coronary artery disease Mother   . Heart disease Mother        CHF  . Diabetes Neg Hx   . Cancer Neg Hx   . Stroke Neg Hx   . Colon cancer Neg Hx     Allergies  Allergen Reactions  . Avelox [Moxifloxacin Hcl In Nacl] Itching and Rash    Medication list has been reviewed and updated.  Current Outpatient Medications on File Prior to Visit  Medication Sig Dispense Refill  . albuterol (PROAIR HFA) 108 (90 Base) MCG/ACT inhaler Inhale 1-2 puffs into the lungs every 6 (six) hours as needed for wheezing or shortness of breath. 1 Inhaler 3  . albuterol (PROVENTIL HFA;VENTOLIN HFA) 108 (90 Base) MCG/ACT inhaler Inhale 2 puffs into the lungs every 6 (six) hours as needed for wheezing or shortness of breath. 1 Inhaler 2  . albuterol (PROVENTIL) (2.5 MG/3ML) 0.083% nebulizer solution Take 3 mLs (2.5 mg total) by nebulization every 6 (six) hours as needed for wheezing or shortness of breath. 150 mL 0  . ALPRAZolam (XANAX) 0.5 MG tablet Take 1 tablet (0.5 mg total) by mouth every 8 (eight) hours as needed for anxiety (**DO NOT TAKE AT NIGHT WITH HYDROXYZINE**). Do not take with alcohol 15 tablet 0  . aspirin EC 81 MG EC tablet Take 1 tablet (81 mg total) by mouth daily. 30 tablet 0  . benzonatate (TESSALON) 200 MG capsule Take 1 capsule (200 mg total) by mouth 3 (three) times daily as needed for cough. 30 capsule 1  . budesonide-formoterol (SYMBICORT) 160-4.5 MCG/ACT inhaler Inhale 2 puffs  into the lungs 2 (two) times daily. 3 Inhaler 1  . calcium-vitamin D (OSCAL WITH D) 250-125 MG-UNIT tablet Take 1 tablet by mouth daily.    . cephALEXin (KEFLEX) 500 MG capsule Take 1 capsule (500 mg total) by mouth 4 (four) times daily. 40 capsule 0  . Cholecalciferol (VITAMIN D) 2000 UNITS CAPS Take 1 capsule by mouth daily.    . hydrochlorothiazide (HYDRODIURIL) 25 MG tablet Take 1/2 or 1 daily as needed for swelling of legs 30 tablet 3  . hydrOXYzine (ATARAX/VISTARIL) 10 MG tablet TAKE 2 TABLETS BY MOUTH AS NEEDED FOR ITCHING. DO NOT TAKE WITH ALPRAZOLAM 60 tablet 0  . ibuprofen (ADVIL,MOTRIN) 800 MG tablet Take 1 tablet (800 mg total) by mouth 3 (three) times daily. 30 tablet 0  . ipratropium (ATROVENT) 0.02 % nebulizer solution Take 2.5 mLs (0.5 mg total) by nebulization every 6 (six) hours as needed for wheezing or shortness of breath. 75 mL 5  . metoprolol tartrate (LOPRESSOR) 25 MG tablet Take 1 tablet (25 mg total) by mouth 2 (two) times daily. 180 tablet 2  . Multiple Vitamin (MULTIVITAMIN) tablet Take 2 tablets by mouth every morning.     . predniSONE (DELTASONE) 20 MG tablet 2 tabs po daily x 4 days 8 tablet 0  . triamcinolone ointment (KENALOG) 0.1 % APPLY TO AFFECTED AREA TWICE A DAY  0   No current facility-administered medications on file prior to visit.     Review of Systems:  As per HPI- otherwise negative.   Physical Examination: Vitals:   12/21/17 1401  BP: 140/82  Pulse: 92  Resp: 16  SpO2: 94%   Vitals:   12/21/17 1401  Weight: 142 lb (64.4 kg)  Height: 5' 6.5" (1.689 m)   Body mass index is 22.58 kg/m. Ideal Body Weight:  Weight in (lb) to have BMI = 25: 156.9  ,GEN: WDWN, NAD, Non-toxic, A & O x 3, looks well, much better today  HEENT: Atraumatic, Normocephalic. Neck supple. No masses, No LAD. Ears and Nose: No external deformity. CV: RRR, No M/G/R. No JVD. No thrill. No extra heart sounds. PULM: CTA B, no wheezes, crackles, rhonchi. No retractions.  No resp. distress. No accessory muscle use. ABD: S, NT, ND EXTR: No c/c/e NEURO Normal gait for her- somewhat slow, but at her baseline  PSYCH: Normally interactive. Conversant. Not depressed or anxious appearing.  Calm demeanor.  Looking much better today, rash is nearly resolved    Assessment and Plan: Hyponatremia - Plan: Basic metabolic panel  Urinary frequency  Rash  Following up today She did end up being seen in the ER- she was treated with IVF, breathing treatment, and abx for urinary tract infection Her rash is much better Would like to repeat her sodium but she asks to defer to next week due to many recent sticks -this is ok with me  I will watch for her repeat BMP next week and for her urine culture if sent to me She will let me know if not continuing to do better   Signed Lamar Blinks, MD

## 2017-12-21 ENCOUNTER — Ambulatory Visit: Payer: PPO | Admitting: Adult Health

## 2017-12-21 ENCOUNTER — Encounter: Payer: Self-pay | Admitting: Adult Health

## 2017-12-21 ENCOUNTER — Encounter: Payer: Self-pay | Admitting: Family Medicine

## 2017-12-21 ENCOUNTER — Ambulatory Visit (INDEPENDENT_AMBULATORY_CARE_PROVIDER_SITE_OTHER): Payer: PPO | Admitting: Family Medicine

## 2017-12-21 VITALS — BP 140/82 | HR 92 | Resp 16 | Ht 66.5 in | Wt 142.0 lb

## 2017-12-21 DIAGNOSIS — E871 Hypo-osmolality and hyponatremia: Secondary | ICD-10-CM | POA: Diagnosis not present

## 2017-12-21 DIAGNOSIS — R350 Frequency of micturition: Secondary | ICD-10-CM | POA: Diagnosis not present

## 2017-12-21 DIAGNOSIS — J441 Chronic obstructive pulmonary disease with (acute) exacerbation: Secondary | ICD-10-CM

## 2017-12-21 DIAGNOSIS — R21 Rash and other nonspecific skin eruption: Secondary | ICD-10-CM

## 2017-12-21 MED ORDER — IPRATROPIUM BROMIDE 0.02 % IN SOLN: 0.5000 mg | Freq: Four times a day (QID) | RESPIRATORY_TRACT | 5 refills | Status: DC | PRN

## 2017-12-21 MED ORDER — ALBUTEROL SULFATE HFA 108 (90 BASE) MCG/ACT IN AERS: 1.0000 | INHALATION_SPRAY | Freq: Four times a day (QID) | RESPIRATORY_TRACT | 3 refills | Status: AC | PRN

## 2017-12-21 MED ORDER — BENZONATATE 200 MG PO CAPS: 200.0000 mg | ORAL_CAPSULE | Freq: Three times a day (TID) | ORAL | 1 refills | Status: DC | PRN

## 2017-12-21 NOTE — Addendum Note (Signed)
Addended by: Valerie Salts on: 12/21/2017 10:10 AM   Modules accepted: Orders

## 2017-12-21 NOTE — Patient Instructions (Signed)
Continue on Symbicort 2 puffs Twice daily   May use  Ipratropium /Albuterol Neb every 6hr as needed.  May use ProAir  Inhaler 2 puffs every 4hrs or Albuterol Neb every 4hrs as needed for wheezing -this is your rescue medication.  Tessalon Three times a day.As needed  Cough .  Follow up Dr. Elsworth Soho in 3-4  months  At [facility] office.  Please contact office for sooner follow up if symptoms do not improve or worsen or seek emergency care

## 2017-12-21 NOTE — Assessment & Plan Note (Addendum)
Mild flare - finish steroids from ER .  Hold on abx  Cont on mucinex and delsym  Add tessalon   Plan  . Patient Instructions  Continue on Symbicort 2 puffs Twice daily   May use  Ipratropium /Albuterol Neb every 6hr as needed.  May use ProAir  Inhaler 2 puffs every 4hrs or Albuterol Neb every 4hrs as needed for wheezing -this is your rescue medication.  Tessalon Three times a day.As needed  Cough .  Follow up Dr. Elsworth Soho in 3-4  months  At [facility] office.  Please contact office for sooner follow up if symptoms do not improve or worsen or seek emergency care

## 2017-12-21 NOTE — Progress Notes (Signed)
@Patient  ID: Jenna Marquez, female    DOB: 07-04-1946, 72 y.o.   MRN: [id number]  Chief Complaint  Patient presents with  . Follow-up    COPD     Referring provider: Copland, Gay Filler, MD  HPI: 72 yo female with COPD -GOLD 3 , former smoker .   Significant tests/ events 09/2010 FeV1 49% Fef 25 75 22%  Ct chest 07/2012 - no nodules/copd changes  CTA Chest 08/2015 , neg PE, COPD changes  Spirometry 7/2017shows FEV1 46%, , ratio 35 , FVC 102%, mid flows 20%.  PVX (2012) and Prevnar (2016)  utd.   12/21/2017 Follow up : COPD  Patient returns for follow up . Last seen in office 02/2017 . Says COPD has been stable , has intermittent cough and wheezing . Remains on Symbicort Twice daily  . Says she does miss some doses on occasion. Takes Duoneb 1/2 vials 2-3 times a day .  Chest xray this week showed COPD .  Takes Delsym for cough . Wants something else to take. Cough is worse at night at times.  No longer doing e cigs.   Was in ER earlier this week for dizziness . She was given IVF /hydration . Treated for UTI with Keflex . She is some better but has diarrhea now . Has ov with PCP today .  Had wheezing in ER , was given prednisone 40mg  daily for 4 days .  ER notes reviewed .   Allergies  Allergen Reactions  . Avelox [Moxifloxacin Hcl In Nacl] Itching and Rash    Immunization History  Administered Date(s) Administered  . Influenza Split 07/14/2011, 07/24/2012, 04/29/2013  . Influenza,inj,Quad PF,6+ Mos 08/28/2014, 09/22/2015, 10/02/2016, 09/07/2017  . Pneumococcal Conjugate-13 10/30/2014  . Pneumococcal Polysaccharide-23 07/14/2011  . Zoster 03/23/2011    Past Medical History:  Diagnosis Date  . Allergic rhinitis   . Anxiety    "occasionally"  . Asthma   . COPD (chronic obstructive pulmonary disease) ([place])   . GERD (gastroesophageal reflux disease)   . Mobitz type 2 second degree AV block 01/06/2015    Tobacco History: Social History   Tobacco Use  Smoking Status  Former Smoker  . Packs/day: 1.00  . Years: 35.00  . Pack years: 35.00  . Types: Cigarettes  . Last attempt to quit: 08/10/2012  . Years since quitting: 5.3  Smokeless Tobacco Never Used  Tobacco Comment   Started at age 1   Counseling given: Not Answered Comment: Started at age 11   Outpatient Encounter Medications as of 12/21/2017  Medication Sig  . albuterol (PROVENTIL HFA;VENTOLIN HFA) 108 (90 Base) MCG/ACT inhaler Inhale 2 puffs into the lungs every 6 (six) hours as needed for wheezing or shortness of breath.  Marland Kitchen albuterol (PROVENTIL) (2.5 MG/3ML) 0.083% nebulizer solution Take 3 mLs (2.5 mg total) by nebulization every 6 (six) hours as needed for wheezing or shortness of breath.  . ALPRAZolam (XANAX) 0.5 MG tablet Take 1 tablet (0.5 mg total) by mouth every 8 (eight) hours as needed for anxiety (**DO NOT TAKE AT NIGHT WITH HYDROXYZINE**). Do not take with alcohol  . aspirin EC 81 MG EC tablet Take 1 tablet (81 mg total) by mouth daily.  . budesonide-formoterol (SYMBICORT) 160-4.5 MCG/ACT inhaler Inhale 2 puffs into the lungs 2 (two) times daily.  . calcium-vitamin D (OSCAL WITH D) 250-125 MG-UNIT tablet Take 1 tablet by mouth daily.  . cephALEXin (KEFLEX) 500 MG capsule Take 1 capsule (500 mg total) by mouth 4 (four)  times daily.  . Cholecalciferol (VITAMIN D) 2000 UNITS CAPS Take 1 capsule by mouth daily.  . hydrochlorothiazide (HYDRODIURIL) 25 MG tablet Take 1/2 or 1 daily as needed for swelling of legs  . hydrOXYzine (ATARAX/VISTARIL) 10 MG tablet TAKE 2 TABLETS BY MOUTH AS NEEDED FOR ITCHING. DO NOT TAKE WITH ALPRAZOLAM  . ibuprofen (ADVIL,MOTRIN) 800 MG tablet Take 1 tablet (800 mg total) by mouth 3 (three) times daily.  Marland Kitchen ipratropium (ATROVENT) 0.02 % nebulizer solution Take 2.5 mLs (0.5 mg total) by nebulization every 6 (six) hours as needed for wheezing or shortness of breath.  . metoprolol tartrate (LOPRESSOR) 25 MG tablet Take 1 tablet (25 mg total) by mouth 2 (two) times  daily.  . Multiple Vitamin (MULTIVITAMIN) tablet Take 2 tablets by mouth every morning.   . predniSONE (DELTASONE) 20 MG tablet 2 tabs po daily x 4 days  . triamcinolone ointment (KENALOG) 0.1 % APPLY TO AFFECTED AREA TWICE A DAY   No facility-administered encounter medications on file as of 12/21/2017.      Review of Systems  Constitutional:   No  weight loss, night sweats,  Fevers, chills,  +fatigue, or  lassitude.  HEENT:   No headaches,  Difficulty swallowing,  Tooth/dental problems, or  Sore throat,                No sneezing, itching, ear ache, nasal congestion, post nasal drip,   CV:  No chest pain,  Orthopnea, PND, swelling in lower extremities, anasarca, dizziness, palpitations, syncope.   GI  No heartburn, indigestion, abdominal pain, nausea, vomiting, , loss of appetite, bloody stools.   Resp:   No chest wall deformity  Skin: no rash or lesions.  GU: no dysuria, change in color of urine, no urgency or frequency.  No flank pain, no hematuria   MS:  No joint pain or swelling.  No decreased range of motion.  No back pain.    Physical Exam  BP 120/74 (BP Location: Left Arm, Patient Position: Sitting, Cuff Size: Normal)   Pulse 97   Ht 5' 6.5" (1.689 m)   Wt 140 lb 3.2 oz (63.6 kg)   SpO2 93%   BMI 22.29 kg/m   GEN: A/Ox3; pleasant , NAD, thin elderly    HEENT:  [place]/AT,  EACs-clear, TMs-wnl, NOSE-clear, THROAT-clear, no lesions, no postnasal drip or exudate noted.   NECK:  Supple w/ fair ROM; no JVD; normal carotid impulses w/o bruits; no thyromegaly or nodules palpated; no lymphadenopathy.    RESP  Decreased BS in bases , few trace rhonchi   no accessory muscle use, no dullness to percussion  CARD:  RRR, no m/r/g, tr  peripheral edema, pulses intact, no cyanosis or clubbing.  GI:   Soft & nt; nml bowel sounds; no organomegaly or masses detected.   Musco: Warm bil, no deformities or joint swelling noted.   Neuro: alert, no focal deficits noted.    Skin:  Warm, no lesions or rashes    Lab Results:  CBC  ProBNP  Imaging: Dg Chest 2 View  Result Date: 12/19/2017 CLINICAL DATA:  Cough and dizziness EXAM: CHEST - 2 VIEW COMPARISON:  08/30/2017 FINDINGS: Cardiac shadow is stable. Pacing device is again seen and stable. The lungs are hyperaerated consistent with COPD. Bibasilar atelectatic changes are noted. No focal infiltrate or effusion is seen. IMPRESSION: COPD and mild bibasilar atelectasis. Electronically Signed   By: Inez Catalina M.D.   On: 12/19/2017 11:29     Assessment &  Plan:   No problem-specific Assessment & Plan notes found for this encounter.     Rexene Edison, NP 12/21/2017

## 2017-12-21 NOTE — Patient Instructions (Addendum)
Decrease your keflex to twice a day and stop after 7 days - let me know if your diarrhea is not better with this change   You may want to get the shingrix vaccine in a few months once you are feeling better   Please come by for a repeat BMP in about one week to check your sodium  Please let me know if you don't continue to feel better

## 2017-12-22 NOTE — Progress Notes (Signed)
Reviewed & agree with plan  

## 2017-12-23 LAB — MEASLES/MUMPS/RUBELLA IMMUNITY
MUMPS IGG: 278 [AU]/ml
Rubella: 9.45 index
Rubeola IgG: 193 AU/mL

## 2017-12-23 LAB — RUBEOLA ANTIBODY, IGM

## 2017-12-24 ENCOUNTER — Other Ambulatory Visit: Payer: Self-pay | Admitting: Family Medicine

## 2018-01-18 DIAGNOSIS — I872 Venous insufficiency (chronic) (peripheral): Secondary | ICD-10-CM | POA: Diagnosis not present

## 2018-01-18 DIAGNOSIS — D2372 Other benign neoplasm of skin of left lower limb, including hip: Secondary | ICD-10-CM | POA: Diagnosis not present

## 2018-01-18 DIAGNOSIS — Q828 Other specified congenital malformations of skin: Secondary | ICD-10-CM | POA: Diagnosis not present

## 2018-01-24 ENCOUNTER — Encounter: Payer: Self-pay | Admitting: Cardiology

## 2018-01-24 NOTE — Progress Notes (Signed)
[facility] Summary Report / Loop Recorder 

## 2018-02-01 ENCOUNTER — Encounter: Payer: PPO | Admitting: Obstetrics and Gynecology

## 2018-02-15 NOTE — Progress Notes (Deleted)
[facility] [address], [place], [place] [address] [phone]  Date:  02/19/2018   Name:  Rim Thatch   DOB:  [birth year]   MRN:  [id number]  PCP:  Darreld Mclean, MD    Chief Complaint: No chief complaint on file.   History of Present Illness:  Jenna Marquez is a 72 y.o. very pleasant female patient who presents with the following:  Here today for a periodic follow-up visit History of PVD, HTN, A fib/ block with pacer, CKD, anxiety, COPD Former smoker  She was here on 5/6 with the following A/P narrative: Here today with multiple complaints and also chest pain which just began while she was in the office.  We will refer to GYN for her post- menopausal bleeding which certainly needs evaluation.  Do not think rash is measles but as there is a [place] outbreak will obtain labs to be sure Explained to pt that I am very concerned about her CP, rash, and complaint of dizziness and fatigue.  Several times I recommended that she go to the ER today for further evaluation.  Pt refused each time.  After spending 2 hours in clinic she took a urine cup home to collect a sample there. I spent over 60 minutes face to face with her today.  Despite my best efforts I am not able to convince her to go to the ER and she went home against my advice.  She is of sound mind and able to make this decision for herself today   She did end up in the ER the day after our visit in May, was treated and released GYN for PM bleeding: ?????????????????  She saw pulmonology in May as well for follow-up  Cardiology visit in Feb: 1.  2:1 AV block: Tronic dual-chamber pacemaker implanted 01/07/15 device functioning appropriately.  She is currently AV conducting and thus we will switch her to MVP mode.  She was initially pacing in the ventricle 70% of the time.   2. Paroxysmal atrial fibrillation: Is refused anticoagulation in the past.  She does  have high ventricular rates that appear to be a one-to-one tachycardia on her device.  She has not been taking her diltiazem, has been taking metoprolol.  We will increase metoprolol to 25 mg twice a day. This patients CHA2DS2-VASc Score and unadjusted Ischemic Stroke Rate (% per year) is equal to 2.2 % stroke rate/year from a score of 2 Above score calculated as 1 point each if present    Patient Active Problem List   Diagnosis Date Noted  . Left knee pain 07/11/2017  . PVD (peripheral vascular disease) ([place]) 04/04/2017  . HTN (hypertension) 11/24/2016  . Paroxysmal atrial fibrillation ([place]) 09/30/2016  . Acute respiratory failure with hypoxia ([place]) 09/30/2016  . Mobitz type 2 second degree AV block 09/30/2016  . Swelling of arm 05/22/2015  . Left shoulder pain 05/22/2015  . CKD (chronic kidney disease) stage 3, GFR 30-59 ml/min ([facility]) 05/11/2015  . Pacemaker 04/22/2015  . Generalized anxiety disorder 01/11/2012  . 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   . Tobacco abuse     Past Medical History:  Diagnosis Date  . Allergic rhinitis   . Anxiety    "occasionally"  . Asthma   . COPD (chronic obstructive pulmonary disease) ([place])   . GERD (gastroesophageal reflux disease)   . Mobitz type 2 second degree AV block 01/06/2015  Past Surgical History:  Procedure Laterality Date  . [place]  . EP IMPLANTABLE DEVICE N/A 01/07/2015   Procedure: Pacemaker Implant;  Surgeon: Evans Lance, MD;  Location: [facility];  Service: Cardiovascular;  Laterality: N/A;  . PACEMAKER INSERTION    . TONSILLECTOMY AND ADENOIDECTOMY  1954    Social History   Tobacco Use  . Smoking status: Former Smoker    Packs/day: 1.00    Years: 35.00    Pack years: 35.00    Types: Cigarettes    Last attempt to quit: 08/10/2012    Years since quitting: 5.5  . Smokeless tobacco: Never Used  . Tobacco comment: Started at age 21  Substance Use Topics  .  Alcohol use: Yes    Alcohol/week: 16.8 oz    Types: 28 Shots of liquor per week    Comment: 01/06/2015 "2 shot mixed drinks, 2/day"  . Drug use: No    Family History  Problem Relation Age of Onset  . Coronary artery disease Mother   . Heart disease Mother        CHF  . Diabetes Neg Hx   . Cancer Neg Hx   . Stroke Neg Hx   . Colon cancer Neg Hx     Allergies  Allergen Reactions  . Avelox [Moxifloxacin Hcl In Nacl] Itching and Rash    Medication list has been reviewed and updated.  Current Outpatient Medications on File Prior to Visit  Medication Sig Dispense Refill  . albuterol (PROAIR HFA) 108 (90 Base) MCG/ACT inhaler Inhale 1-2 puffs into the lungs every 6 (six) hours as needed for wheezing or shortness of breath. 1 Inhaler 3  . albuterol (PROVENTIL HFA;VENTOLIN HFA) 108 (90 Base) MCG/ACT inhaler Inhale 2 puffs into the lungs every 6 (six) hours as needed for wheezing or shortness of breath. 1 Inhaler 2  . albuterol (PROVENTIL) (2.5 MG/3ML) 0.083% nebulizer solution Take 3 mLs (2.5 mg total) by nebulization every 6 (six) hours as needed for wheezing or shortness of breath. 150 mL 0  . ALPRAZolam (XANAX) 0.5 MG tablet Take 1 tablet (0.5 mg total) by mouth every 8 (eight) hours as needed for anxiety (**DO NOT TAKE AT NIGHT WITH HYDROXYZINE**). Do not take with alcohol 15 tablet 0  . aspirin EC 81 MG EC tablet Take 1 tablet (81 mg total) by mouth daily. 30 tablet 0  . benzonatate (TESSALON) 200 MG capsule Take 1 capsule (200 mg total) by mouth 3 (three) times daily as needed for cough. 30 capsule 1  . budesonide-formoterol (SYMBICORT) 160-4.5 MCG/ACT inhaler Inhale 2 puffs into the lungs 2 (two) times daily. 3 Inhaler 1  . calcium-vitamin D (OSCAL WITH D) 250-125 MG-UNIT tablet Take 1 tablet by mouth daily.    . cephALEXin (KEFLEX) 500 MG capsule Take 1 capsule (500 mg total) by mouth 4 (four) times daily. 40 capsule 0  . Cholecalciferol (VITAMIN D) 2000 UNITS CAPS Take 1 capsule by  mouth daily.    . hydrochlorothiazide (HYDRODIURIL) 25 MG tablet TAKE 1/2 OR 1 DAILY AS NEEDED FOR SWELLING OF LEGS 30 tablet 2  . hydrOXYzine (ATARAX/VISTARIL) 10 MG tablet TAKE 2 TABLETS BY MOUTH AS NEEDED FOR ITCHING. DO NOT TAKE WITH ALPRAZOLAM 60 tablet 0  . ibuprofen (ADVIL,MOTRIN) 800 MG tablet Take 1 tablet (800 mg total) by mouth 3 (three) times daily. 30 tablet 0  . ipratropium (ATROVENT) 0.02 % nebulizer solution Take 2.5 mLs (0.5 mg total) by nebulization every  6 (six) hours as needed for wheezing or shortness of breath. 75 mL 5  . metoprolol tartrate (LOPRESSOR) 25 MG tablet Take 1 tablet (25 mg total) by mouth 2 (two) times daily. 180 tablet 2  . Multiple Vitamin (MULTIVITAMIN) tablet Take 2 tablets by mouth every morning.     . predniSONE (DELTASONE) 20 MG tablet 2 tabs po daily x 4 days 8 tablet 0  . triamcinolone ointment (KENALOG) 0.1 % APPLY TO AFFECTED AREA TWICE A DAY  0   No current facility-administered medications on file prior to visit.     Review of Systems:  As per HPI- otherwise negative.   Physical Examination: There were no vitals filed for this visit. There were no vitals filed for this visit. There is no height or weight on file to calculate BMI. Ideal Body Weight:    GEN: WDWN, NAD, Non-toxic, A & O x 3 HEENT: Atraumatic, Normocephalic. Neck supple. No masses, No LAD. Ears and Nose: No external deformity. CV: RRR, No M/G/R. No JVD. No thrill. No extra heart sounds. PULM: CTA B, no wheezes, crackles, rhonchi. No retractions. No resp. distress. No accessory muscle use. ABD: S, NT, ND, +BS. No rebound. No HSM. EXTR: No c/c/e NEURO Normal gait.  PSYCH: Normally interactive. Conversant. Not depressed or anxious appearing.  Calm demeanor.    Assessment and Plan: ***  Signed Lamar Blinks, MD

## 2018-02-18 ENCOUNTER — Encounter (HOSPITAL_COMMUNITY): Payer: Self-pay | Admitting: *Deleted

## 2018-02-18 ENCOUNTER — Emergency Department (HOSPITAL_COMMUNITY): Payer: PPO

## 2018-02-18 ENCOUNTER — Inpatient Hospital Stay (HOSPITAL_COMMUNITY)
Admission: EM | Admit: 2018-02-18 | Discharge: 2018-02-22 | DRG: 194 | Disposition: A | Discharge status: Home Health | Payer: PPO | Attending: Family Medicine | Admitting: Family Medicine

## 2018-02-18 ENCOUNTER — Other Ambulatory Visit: Payer: Self-pay

## 2018-02-18 DIAGNOSIS — K760 Fatty (change of) liver, not elsewhere classified: Secondary | ICD-10-CM | POA: Diagnosis present

## 2018-02-18 DIAGNOSIS — I10 Essential (primary) hypertension: Secondary | ICD-10-CM | POA: Diagnosis present

## 2018-02-18 DIAGNOSIS — I739 Peripheral vascular disease, unspecified: Secondary | ICD-10-CM | POA: Diagnosis present

## 2018-02-18 DIAGNOSIS — J309 Allergic rhinitis, unspecified: Secondary | ICD-10-CM | POA: Diagnosis present

## 2018-02-18 DIAGNOSIS — Z950 Presence of cardiac pacemaker: Secondary | ICD-10-CM

## 2018-02-18 DIAGNOSIS — N390 Urinary tract infection, site not specified: Secondary | ICD-10-CM | POA: Diagnosis present

## 2018-02-18 DIAGNOSIS — N939 Abnormal uterine and vaginal bleeding, unspecified: Secondary | ICD-10-CM

## 2018-02-18 DIAGNOSIS — F10239 Alcohol dependence with withdrawal, unspecified: Secondary | ICD-10-CM | POA: Diagnosis not present

## 2018-02-18 DIAGNOSIS — Z7982 Long term (current) use of aspirin: Secondary | ICD-10-CM | POA: Diagnosis not present

## 2018-02-18 DIAGNOSIS — I480 Paroxysmal atrial fibrillation: Secondary | ICD-10-CM | POA: Diagnosis present

## 2018-02-18 DIAGNOSIS — N183 Chronic kidney disease, stage 3 unspecified: Secondary | ICD-10-CM | POA: Diagnosis present

## 2018-02-18 DIAGNOSIS — I129 Hypertensive chronic kidney disease with stage 1 through stage 4 chronic kidney disease, or unspecified chronic kidney disease: Secondary | ICD-10-CM | POA: Diagnosis present

## 2018-02-18 DIAGNOSIS — D62 Acute posthemorrhagic anemia: Secondary | ICD-10-CM | POA: Diagnosis not present

## 2018-02-18 DIAGNOSIS — F1010 Alcohol abuse, uncomplicated: Secondary | ICD-10-CM | POA: Diagnosis not present

## 2018-02-18 DIAGNOSIS — J189 Pneumonia, unspecified organism: Secondary | ICD-10-CM

## 2018-02-18 DIAGNOSIS — K7689 Other specified diseases of liver: Secondary | ICD-10-CM | POA: Diagnosis not present

## 2018-02-18 DIAGNOSIS — F419 Anxiety disorder, unspecified: Secondary | ICD-10-CM | POA: Diagnosis present

## 2018-02-18 DIAGNOSIS — L299 Pruritus, unspecified: Secondary | ICD-10-CM | POA: Diagnosis present

## 2018-02-18 DIAGNOSIS — Z888 Allergy status to other drugs, medicaments and biological substances status: Secondary | ICD-10-CM | POA: Diagnosis not present

## 2018-02-18 DIAGNOSIS — J449 Chronic obstructive pulmonary disease, unspecified: Secondary | ICD-10-CM | POA: Diagnosis not present

## 2018-02-18 DIAGNOSIS — F329 Major depressive disorder, single episode, unspecified: Secondary | ICD-10-CM | POA: Diagnosis not present

## 2018-02-18 DIAGNOSIS — K219 Gastro-esophageal reflux disease without esophagitis: Secondary | ICD-10-CM | POA: Diagnosis present

## 2018-02-18 DIAGNOSIS — B9620 Unspecified Escherichia coli [E. coli] as the cause of diseases classified elsewhere: Secondary | ICD-10-CM | POA: Diagnosis present

## 2018-02-18 DIAGNOSIS — Z9119 Patient's noncompliance with other medical treatment and regimen: Secondary | ICD-10-CM

## 2018-02-18 DIAGNOSIS — D259 Leiomyoma of uterus, unspecified: Secondary | ICD-10-CM | POA: Diagnosis present

## 2018-02-18 DIAGNOSIS — I441 Atrioventricular block, second degree: Secondary | ICD-10-CM | POA: Diagnosis present

## 2018-02-18 DIAGNOSIS — J181 Lobar pneumonia, unspecified organism: Principal | ICD-10-CM | POA: Diagnosis present

## 2018-02-18 DIAGNOSIS — F322 Major depressive disorder, single episode, severe without psychotic features: Secondary | ICD-10-CM | POA: Diagnosis not present

## 2018-02-18 DIAGNOSIS — Z720 Tobacco use: Secondary | ICD-10-CM | POA: Diagnosis not present

## 2018-02-18 DIAGNOSIS — J430 Unilateral pulmonary emphysema [MacLeod's syndrome]: Secondary | ICD-10-CM | POA: Diagnosis not present

## 2018-02-18 DIAGNOSIS — L309 Dermatitis, unspecified: Secondary | ICD-10-CM | POA: Diagnosis not present

## 2018-02-18 DIAGNOSIS — R7989 Other specified abnormal findings of blood chemistry: Secondary | ICD-10-CM

## 2018-02-18 DIAGNOSIS — R45851 Suicidal ideations: Secondary | ICD-10-CM | POA: Diagnosis not present

## 2018-02-18 DIAGNOSIS — J441 Chronic obstructive pulmonary disease with (acute) exacerbation: Secondary | ICD-10-CM | POA: Diagnosis not present

## 2018-02-18 DIAGNOSIS — Z8249 Family history of ischemic heart disease and other diseases of the circulatory system: Secondary | ICD-10-CM

## 2018-02-18 DIAGNOSIS — R042 Hemoptysis: Secondary | ICD-10-CM | POA: Diagnosis not present

## 2018-02-18 DIAGNOSIS — N950 Postmenopausal bleeding: Secondary | ICD-10-CM

## 2018-02-18 DIAGNOSIS — Z87891 Personal history of nicotine dependence: Secondary | ICD-10-CM

## 2018-02-18 DIAGNOSIS — J438 Other emphysema: Secondary | ICD-10-CM | POA: Diagnosis not present

## 2018-02-18 DIAGNOSIS — R945 Abnormal results of liver function studies: Secondary | ICD-10-CM

## 2018-02-18 DIAGNOSIS — R0489 Hemorrhage from other sites in respiratory passages: Secondary | ICD-10-CM | POA: Diagnosis present

## 2018-02-18 LAB — COMPREHENSIVE METABOLIC PANEL
ALK PHOS: 256 U/L — AB (ref 38–126)
ALT: 78 U/L — ABNORMAL HIGH (ref 0–44)
ANION GAP: 18 — AB (ref 5–15)
AST: 282 U/L — ABNORMAL HIGH (ref 15–41)
Albumin: 2.6 g/dL — ABNORMAL LOW (ref 3.5–5.0)
BILIRUBIN TOTAL: 1.5 mg/dL — AB (ref 0.3–1.2)
BUN: 5 mg/dL — ABNORMAL LOW (ref 8–23)
CALCIUM: 8.6 mg/dL — AB (ref 8.9–10.3)
CO2: 25 mmol/L (ref 22–32)
Chloride: 99 mmol/L (ref 98–111)
Creatinine, Ser: 0.78 mg/dL (ref 0.44–1.00)
GFR calc Af Amer: >60 mL/min (ref >60)
GFR calc non Af Amer: >60 mL/min (ref >60)
Glucose, Bld: 72 mg/dL (ref 70–99)
POTASSIUM: 3.6 mmol/L (ref 3.5–5.1)
SODIUM: 142 mmol/L (ref 135–145)
TOTAL PROTEIN: 6.2 g/dL — AB (ref 6.5–8.1)

## 2018-02-18 LAB — WET PREP, GENITAL
Clue Cells Wet Prep HPF POC: NONE SEEN
Sperm: NONE SEEN
Trich, Wet Prep: NONE SEEN
Yeast Wet Prep HPF POC: NONE SEEN

## 2018-02-18 LAB — CBC
HCT: 32.8 % — ABNORMAL LOW (ref 36.0–46.0)
HEMOGLOBIN: 11.6 g/dL — AB (ref 12.0–15.0)
MCH: 37.4 pg — ABNORMAL HIGH (ref 26.0–34.0)
MCHC: 35.4 g/dL (ref 30.0–36.0)
MCV: 105.8 fL — ABNORMAL HIGH (ref 78.0–100.0)
Platelets: 327 10*3/uL (ref 150–400)
RBC: 3.1 MIL/uL — AB (ref 3.87–5.11)
RDW: 13.7 % (ref 11.5–15.5)
WBC: 8 10*3/uL (ref 4.0–10.5)

## 2018-02-18 LAB — TYPE AND SCREEN
ABO/RH(D): O NEG
Antibody Screen: NEGATIVE

## 2018-02-18 LAB — PROTIME-INR
INR: 0.97
PROTHROMBIN TIME: 12.8 s (ref 11.4–15.2)

## 2018-02-18 LAB — BRAIN NATRIURETIC PEPTIDE: B NATRIURETIC PEPTIDE 5: 136.4 pg/mL — AB (ref 0.0–100.0)

## 2018-02-18 LAB — APTT: aPTT: 29 seconds (ref 24–36)

## 2018-02-18 LAB — ETHANOL: Alcohol, Ethyl (B): 41 mg/dL — ABNORMAL HIGH (ref <10)

## 2018-02-18 MED ORDER — HYDROXYZINE HCL 25 MG PO TABS
25.0000 mg | ORAL_TABLET | Freq: Once | ORAL | Status: AC
  Administered 2018-02-18: 25 mg via ORAL
  Filled 2018-02-18: qty 1

## 2018-02-18 MED ORDER — IPRATROPIUM-ALBUTEROL 0.5-2.5 (3) MG/3ML IN SOLN
3.0000 mL | Freq: Once | RESPIRATORY_TRACT | Status: AC
  Administered 2018-02-18: 3 mL via RESPIRATORY_TRACT
  Filled 2018-02-18: qty 6

## 2018-02-18 MED ORDER — LORAZEPAM 1 MG PO TABS
0.0000 mg | ORAL_TABLET | Freq: Two times a day (BID) | ORAL | Status: DC
Start: 2018-02-21 — End: 2018-02-22
  Administered 2018-02-21 (×2): 1 mg via ORAL
  Filled 2018-02-18 (×2): qty 1

## 2018-02-18 MED ORDER — IOPAMIDOL (ISOVUE-370) INJECTION 76%
100.0000 mL | Freq: Once | INTRAVENOUS | Status: AC | PRN
  Administered 2018-02-18: 80 mL via INTRAVENOUS

## 2018-02-18 MED ORDER — THIAMINE HCL 100 MG/ML IJ SOLN: 100.0000 mg | Freq: Every day | INTRAMUSCULAR | Status: DC

## 2018-02-18 MED ORDER — METHYLPREDNISOLONE SODIUM SUCC 125 MG IJ SOLR
125.0000 mg | Freq: Once | INTRAMUSCULAR | Status: AC
  Administered 2018-02-18: 125 mg via INTRAVENOUS
  Filled 2018-02-18: qty 2

## 2018-02-18 MED ORDER — VITAMIN B-1 100 MG PO TABS
100.0000 mg | ORAL_TABLET | Freq: Every day | ORAL | Status: DC
  Administered 2018-02-19 – 2018-02-22 (×4): 100 mg via ORAL
  Filled 2018-02-18 (×4): qty 1

## 2018-02-18 MED ORDER — LORAZEPAM 1 MG PO TABS
0.0000 mg | ORAL_TABLET | Freq: Four times a day (QID) | ORAL | Status: AC
  Administered 2018-02-20: 1 mg via ORAL
  Filled 2018-02-18: qty 1

## 2018-02-18 MED ORDER — LORAZEPAM 2 MG/ML IJ SOLN
0.0000 mg | Freq: Four times a day (QID) | INTRAMUSCULAR | Status: AC
  Administered 2018-02-18: 2 mg via INTRAVENOUS
  Administered 2018-02-19 – 2018-02-20 (×3): 1 mg via INTRAVENOUS
  Filled 2018-02-18 (×4): qty 1

## 2018-02-18 MED ORDER — LORAZEPAM 2 MG/ML IJ SOLN: 0.0000 mg | Freq: Two times a day (BID) | INTRAMUSCULAR | Status: DC

## 2018-02-18 MED ORDER — SODIUM CHLORIDE 0.9 % IV SOLN
500.0000 mg | Freq: Once | INTRAVENOUS | Status: AC
  Administered 2018-02-18: 500 mg via INTRAVENOUS
  Filled 2018-02-18: qty 500

## 2018-02-18 MED ORDER — IOPAMIDOL (ISOVUE-370) INJECTION 76%
INTRAVENOUS | Status: AC
  Administered 2018-02-18: 23:00:00
  Filled 2018-02-18: qty 100

## 2018-02-18 MED ORDER — CEFTRIAXONE SODIUM 1 G IJ SOLR
1.0000 g | Freq: Once | INTRAMUSCULAR | Status: AC
  Administered 2018-02-18: 1 g via INTRAVENOUS
  Filled 2018-02-18: qty 10

## 2018-02-18 NOTE — ED Notes (Signed)
Pt has purwik placed to retrieve urine but there is not urine in container at this time.

## 2018-02-18 NOTE — ED Notes (Signed)
Pt has purwik placed. 

## 2018-02-18 NOTE — ED Provider Notes (Addendum)
[facility] DEPT Provider Note   CSN: [id number] Arrival date & time: 02/18/18  1904     History   Chief Complaint Chief Complaint  Patient presents with  . Hematemesis    HPI Jenna Marquez is a 72 y.o. female.  Jenna Marquez is a 72 y.o. Female with a history of COPD, GERD, paroxysmal A. fib with pacemaker, CKD, hypertension and dermatitis, who presents for evaluation of multiple complaints.  Patient reports she had 4-5 episodes of hemoptysis where she coughed up greater than 1 tablespoon of blood each time and patient reports some clots present.  She reports she has been feeling shortness of breath but she always feels short of breath with her COPD, does report some increased coughing, denies chest pain.  Denies fevers but does report some chills.  Patient also reports she has been having some vaginal bleeding which started as spotting but for the past week she is been having regular vaginal bleeding, she has not had any bleeding for over 20 years until this past week she denies pelvic or abdominal pain, no vaginal discharge.  Patient denies any hematemesis, melena or hematochezia.  She denies any nosebleeds.  Patient takes daily aspirin but is not on any blood thinners.  Patient also reports that both of her knees and upper legs have been very swollen for the past week and painful to the touch she denies redness or warmth there, she reports she used to be on a fluid pill but her dermatologist discontinued this.  She reports she has been dealing with dermatitis for the past 9 months and has been using several creams and hydroxyzine but still has severe itching and dry skin.  Patient's great nephew is at the bedside who is the power of attorney and lives with the patient.  He reports that the patient drinks heavily on a regular basis and this is becoming an increasing problem she has been endorsing desire to not live anymore, and has been drinking increasing  amounts and he is concerned for her safety and her ability to continue to live on her own she currently lives with her but is planning to move out soon.     Past Medical History:  Diagnosis Date  . Allergic rhinitis   . Anxiety    "occasionally"  . Asthma   . COPD (chronic obstructive pulmonary disease) ([place])   . GERD (gastroesophageal reflux disease)   . 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 Left knee pain 07/11/2017  . PVD (peripheral vascular disease) ([place]) 04/04/2017  . HTN (hypertension) 11/24/2016  . Paroxysmal atrial fibrillation ([place]) 09/30/2016  . Acute respiratory failure with hypoxia ([place]) 09/30/2016  . Mobitz type 2 second degree AV block 09/30/2016  . Swelling of arm 05/22/2015  . Left shoulder pain 05/22/2015  . CKD (chronic kidney disease) stage 3, GFR 30-59 ml/min (HCC) 05/11/2015  . Pacemaker 04/22/2015  . Generalized anxiety disorder 01/11/2012  . 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   . Tobacco abuse     Past Surgical History:  Procedure Laterality Date  . [place]  . EP IMPLANTABLE DEVICE N/A 01/07/2015   Procedure: Pacemaker Implant;  Surgeon: Evans Lance, MD;  Location: [facility];  Service: Cardiovascular;  Laterality: N/A;  . PACEMAKER INSERTION    . [place]     OB History  None      Home Medications    Prior to Admission medications   Medication Sig Start Date End Date Taking? Authorizing Provider  albuterol (PROAIR HFA) 108 (90 Base) MCG/ACT inhaler Inhale 1-2 puffs into the lungs every 6 (six) hours as needed for wheezing or shortness of breath. 12/21/17   Parrett, Fonnie Mu, NP  albuterol (PROVENTIL HFA;VENTOLIN HFA) 108 (90 Base) MCG/ACT inhaler Inhale 2 puffs into the lungs every 6 (six) hours as needed for wheezing or shortness of breath. 10/02/16   Nita Sells, MD  albuterol  (PROVENTIL) (2.5 MG/3ML) 0.083% nebulizer solution Take 3 mLs (2.5 mg total) by nebulization every 6 (six) hours as needed for wheezing or shortness of breath. 03/31/17   Copland, Gay Filler, MD  ALPRAZolam Duanne Moron) 0.5 MG tablet Take 1 tablet (0.5 mg total) by mouth every 8 (eight) hours as needed for anxiety (**DO NOT TAKE AT NIGHT WITH HYDROXYZINE**). Do not take with alcohol 07/04/17   Saguier, Percell Miller, PA-C  aspirin EC 81 MG EC tablet Take 1 tablet (81 mg total) by mouth daily. 10/02/16   Nita Sells, MD  benzonatate (TESSALON) 200 MG capsule Take 1 capsule (200 mg total) by mouth 3 (three) times daily as needed for cough. 12/21/17 12/21/18  Parrett, Fonnie Mu, NP  budesonide-formoterol (SYMBICORT) 160-4.5 MCG/ACT inhaler Inhale 2 puffs into the lungs 2 (two) times daily. 10/27/17   Parrett, Fonnie Mu, NP  calcium-vitamin D (OSCAL WITH D) 250-125 MG-UNIT tablet Take 1 tablet by mouth daily.    [provider]  cephALEXin (KEFLEX) 500 MG capsule Take 1 capsule (500 mg total) by mouth 4 (four) times daily. 12/19/17   Deno Etienne, DO  Cholecalciferol (VITAMIN D) 2000 UNITS CAPS Take 1 capsule by mouth daily.    [provider]  hydrochlorothiazide (HYDRODIURIL) 25 MG tablet TAKE 1/2 OR 1 DAILY AS NEEDED FOR SWELLING OF LEGS 12/25/17   Copland, Gay Filler, MD  hydrOXYzine (ATARAX/VISTARIL) 10 MG tablet TAKE 2 TABLETS BY MOUTH AS NEEDED FOR ITCHING. DO NOT TAKE WITH ALPRAZOLAM 08/04/17   Saguier, Percell Miller, PA-C  ibuprofen (ADVIL,MOTRIN) 800 MG tablet Take 1 tablet (800 mg total) by mouth 3 (three) times daily. 08/31/17   Copland, Gay Filler, MD  ipratropium (ATROVENT) 0.02 % nebulizer solution Take 2.5 mLs (0.5 mg total) by nebulization every 6 (six) hours as needed for wheezing or shortness of breath. 12/21/17   Parrett, Fonnie Mu, NP  metoprolol tartrate (LOPRESSOR) 25 MG tablet Take 1 tablet (25 mg total) by mouth 2 (two) times daily. 10/10/17 01/08/18  Camnitz, Ocie Doyne, MD  Multiple Vitamin  (MULTIVITAMIN) tablet Take 2 tablets by mouth every morning.     [provider]  predniSONE (DELTASONE) 20 MG tablet 2 tabs po daily x 4 days 12/19/17   Deno Etienne, DO  triamcinolone ointment (KENALOG) 0.1 % APPLY TO AFFECTED AREA TWICE A DAY 07/10/17   [provider]    Family History Family History  Problem Relation Age of Onset  . Coronary artery disease Mother   . Heart disease Mother        CHF  . Diabetes Neg Hx   . Cancer Neg Hx   . Stroke Neg Hx   . Colon cancer Neg Hx     Social History Social History   Tobacco Use  . Smoking status: Former Smoker    Packs/day: 1.00    Years: 35.00    Pack years: 35.00    Types: Cigarettes  Last attempt to quit: 08/10/2012    Years since quitting: 5.5  . Smokeless tobacco: Never Used  . Tobacco comment: Started at age 61  Substance Use Topics  . Alcohol use: Yes    Alcohol/week: 16.8 oz    Types: 28 Shots of liquor per week    Comment: 01/06/2015 "2 shot mixed drinks, 2/day"  . Drug use: No     Allergies   Avelox [moxifloxacin hcl in nacl]   Review of Systems Review of Systems  Constitutional: Negative for chills and fever.  HENT: Negative for congestion, nosebleeds, rhinorrhea and sore throat.   Eyes: Negative for visual disturbance.  Respiratory: Positive for cough and shortness of breath. Negative for chest tightness and wheezing.        Hemoptysis  Cardiovascular: Positive for leg swelling. Negative for chest pain and palpitations.  Gastrointestinal: Negative for abdominal pain, blood in stool, constipation, diarrhea, nausea and vomiting.  Genitourinary: Positive for vaginal bleeding. Negative for dysuria, frequency, pelvic pain and vaginal pain.  Musculoskeletal: Positive for myalgias. Negative for arthralgias.  Skin: Negative for color change, rash and wound.  Neurological: Negative for dizziness, syncope, weakness, light-headedness, numbness and headaches.  Psychiatric/Behavioral: Positive for  dysphoric mood and suicidal ideas.     Physical Exam Updated Vital Signs BP 129/69 (BP Location: Right Arm)   Pulse 98   Temp (!) 97.4 F (36.3 C) (Oral)   Resp 18   Ht _0  (1.676 m)   Wt 64.9 kg (143 lb)   SpO2 98%   BMI 23.08 kg/m   Physical Exam  Constitutional: She is oriented to person, place, and time. She appears well-developed and well-nourished. No distress.  Patient appears very anxious and easily tears up during encounter  HENT:  Head: Normocephalic and atraumatic.  Mouth/Throat: Oropharynx is clear and moist.  Posterior oropharynx clear and bilateral nares clear with no evidence of bleeding  Eyes: Pupils are equal, round, and reactive to light. EOM are normal. Right eye exhibits no discharge. Left eye exhibits no discharge.  Neck: Neck supple.  Cardiovascular: Normal heart sounds and intact distal pulses.  Mildly tachycardic, rhythm irregularly irregular  Pulmonary/Chest: Effort normal. No stridor. No respiratory distress. She has wheezes. She has no rales. She exhibits no tenderness.  Respirations equal and unlabored, patient able to speak in full sentences, lungs with bilateral end expiratory wheezing throughout, good air movement  Abdominal: Soft. Bowel sounds are normal. She exhibits no distension and no mass. There is no tenderness. There is no guarding.  Abdomen soft, nondistended, nontender to palpation in all quadrants without guarding or peritoneal signs  Genitourinary:  Genitourinary Comments: Patient with tender excoriations to the inner left buttock, no surrounding erythema or induration, no fluctuance to suggest infection  Musculoskeletal:  Bilateral lower extremities with 3+ pitting edema of the upper thighs and knees  Neurological: She is alert and oriented to person, place, and time. Coordination normal.  Skin: Skin is warm and dry. Capillary refill takes less than 2 seconds. She is not diaphoretic.  Psychiatric: Her speech is normal. Her mood  appears anxious. She is slowed. She is not actively hallucinating. She expresses homicidal and suicidal ideation. She expresses no suicidal plans and no homicidal plans.  Nursing note and vitals reviewed.    ED Treatments / Results  Labs (all labs ordered are listed, but only abnormal results are displayed) Labs Reviewed  WET PREP, GENITAL - Abnormal; Notable for the following components:      Result Value  WBC, Wet Prep HPF POC MODERATE (*)    All other components within normal limits  COMPREHENSIVE METABOLIC PANEL - Abnormal; Notable for the following components:   BUN 5 (*)    Calcium 8.6 (*)    Total Protein 6.2 (*)    Albumin 2.6 (*)    AST 282 (*)    ALT 78 (*)    Alkaline Phosphatase 256 (*)    Total Bilirubin 1.5 (*)    Anion gap 18 (*)    All other components within normal limits  CBC - Abnormal; Notable for the following components:   RBC 3.10 (*)    Hemoglobin 11.6 (*)    HCT 32.8 (*)    MCV 105.8 (*)    MCH 37.4 (*)    All other components within normal limits  BRAIN NATRIURETIC PEPTIDE - Abnormal; Notable for the following components:   B Natriuretic Peptide 136.4 (*)    All other components within normal limits  ETHANOL - Abnormal; Notable for the following components:   Alcohol, Ethyl (B) 41 (*)    All other components within normal limits  URINE CULTURE  PROTIME-INR  APTT  URINALYSIS, ROUTINE W REFLEX MICROSCOPIC  ACETAMINOPHEN LEVEL  SALICYLATE LEVEL  TYPE AND SCREEN    EKG EKG Interpretation  Date/Time:  Sunday February 18 2018 20:41:21 EDT Ventricular Rate:  101 PR Interval:    QRS Duration: 123 QT Interval:  361 QTC Calculation: 457 R Axis:   57 Text Interpretation:  Atrial-paced complexes Right bundle branch block Probable lateral infarct, old Confirmed by Quintella Reichert [phone]) on 02/18/2018 9:10:11 PM   Radiology Ct Angio Chest Pe W And/or Wo Contrast  Result Date: 02/18/2018 CLINICAL DATA:  Hemoptysis mild-to-moderate, hematemesis, not  on blood thinners, history COPD, hypertension, has had vaginal bleeding for 4 days EXAM: CT ANGIOGRAPHY CHEST WITH CONTRAST TECHNIQUE: Multidetector CT imaging of the chest was performed using the standard protocol during bolus administration of intravenous contrast. Multiplanar CT image reconstructions and MIPs were obtained to evaluate the vascular anatomy. CONTRAST:  72m ISOVUE-370 IOPAMIDOL (ISOVUE-370) INJECTION 76% IV COMPARISON:  09/10/2015 FINDINGS: Cardiovascular: LEFT subclavian transvenous pacemaker leads at RIGHT atrium and RIGHT ventricle. Atherosclerotic calcifications aorta, coronary arteries, and proximal great vessels. Aorta normal caliber without aneurysm or dissection. A small lymph node is again identified at the bifurcation of the lingular pulmonary artery. No pulmonary emboli are identified. No pericardial effusion. Mediastinum/Nodes: Esophagus normal appearance. Base of cervical region normal appearance. No thoracic adenopathy. 8 mm short axis precarinal lymph node noted. Lungs/Pleura: Emphysematous changes. Significant RIGHT lower lobe opacity favor representing pneumonia. Central peribronchial thickening. Remaining lungs clear. No pleural effusion or pneumothorax. Upper Abdomen: Marked fatty infiltration of liver. Remaining visualized upper abdomen unremarkable old healed Musculoskeletal: Diffuse osseous demineralization. Review of the MIP images confirms the above findings. IMPRESSION: No evidence of pulmonary embolism. Atherosclerotic calcifications including coronary arteries. Emphysematous and bronchitic changes consistent with COPD. Opacification of the central portions of the RIGHT lower lobe favoring pneumonia, though pulmonary hemorrhage could have a similar appearance. Follow-up evaluation until resolution recommended to ensure resolution and exclude underlying abnormalities including tumor. Marked fatty infiltration of liver. Aortic Atherosclerosis (ICD10-I70.0) and Emphysema  (ICD10-J43.9). Electronically Signed   By: MLavonia DanaM.D.   On: 02/18/2018 23:07    Procedures Procedures (including critical care time)  Medications Ordered in ED Medications  LORazepam (ATIVAN) injection 0-4 mg (2 mg Intravenous Given 02/18/18 2343)    Or  LORazepam (ATIVAN) tablet 0-4 mg (  Oral See Alternative 02/18/18 2343)  LORazepam (ATIVAN) injection 0-4 mg (has no administration in time range)    Or  LORazepam (ATIVAN) tablet 0-4 mg (has no administration in time range)  thiamine (VITAMIN B-1) tablet 100 mg (has no administration in time range)    Or  thiamine (B-1) injection 100 mg (has no administration in time range)  cefTRIAXone (ROCEPHIN) 1 g in sodium chloride 0.9 % 100 mL IVPB (1 g Intravenous New Bag/Given 02/18/18 2346)  azithromycin (ZITHROMAX) 500 mg in sodium chloride 0.9 % 250 mL IVPB (500 mg Intravenous New Bag/Given 02/18/18 2346)  hydrOXYzine (ATARAX/VISTARIL) tablet 25 mg (25 mg Oral Given 02/18/18 2033)  iopamidol (ISOVUE-370) 76 % injection 100 mL (80 mLs Intravenous Contrast Given 02/18/18 2238)  iopamidol (ISOVUE-370) 76 % injection (  Contrast Given 02/18/18 2320)  ipratropium-albuterol (DUONEB) 0.5-2.5 (3) MG/3ML nebulizer solution 3 mL (3 mLs Nebulization Given 02/18/18 2341)  methylPREDNISolone sodium succinate (SOLU-MEDROL) 125 mg/2 mL injection 125 mg (125 mg Intravenous Given 02/18/18 2349)     Initial Impression / Assessment and Plan / ED Course  I have reviewed the triage vital signs and the nursing notes.  Pertinent labs & imaging results that were available during my care of the patient were reviewed by me and considered in my medical decision making (see chart for details).  Patient presents for evaluation of multiple complaints. On arrival patient's vitals are stable.  She appears anxious but in no acute distress. She has had several episodes of small-volume hemoptysis today, history of COPD with slightly worsening shortness of breath and cough, no fevers.   Lung exam with mild bilateral end expiratory wheeze.  Patient also reports vaginal bleeding, on exam she has a small amount of blood present in the vaginal vault, no uterine or adnexal tenderness, wet prep collected.  Abdomen is benign on exam.  Patient with bilateral lower extremity edema of the upper legs and knees.  Nephew reports frequent alcohol use.  She has been endorsing intermittent suicidal ideations.  Given her hemoptysis will get CT PE study of the chest, basic labs, BNP given leg swelling, EKG, coags and urinalysis.  Lab evaluation shows stable hemoglobin, no leukocytosis.  No acute electrolyte derangements and normal renal function, LFTs are elevated, AST more than 2 times ALT suggestive of hepatocellular damage, alk phos 256.  Patient's ethanol level is slightly elevated at 41.  I think that elevation in LFTs is likely due to alcohol use but will also check salicylate and acetaminophen levels and right upper quadrant ultrasound.  EKG shows atrial paced A. fib without concerning ischemic changes, BNP unremarkable.  Normal coags patient does not appear to have any bleeding symptoms outside of hemoptysis and vaginal bleeding.  CT angios shows no evidence of PE but does show right lower lobe pneumonia, but cannot rule out pulmonary hemorrhage.  Patient has been intermittently tachycardic and tachypneic.  Given patient's COPD with some exacerbation here today, alcohol abuse, and hemoptysis in the setting of this pneumonia discussed with Dr. Ralene Bathe and feel she will need to be admitted to the hospital.  Patient started on Rocephin and azithromycin.  Consult placed to hospitalist.  12:25 AM spoke with Dr. Jonelle Sidle with [facility] hospitalist regarding the patient who plans to see and admit the patient but requests pulmonology consult regarding hemoptysis as patient may require bronchoscopy.  12:28 AM spoke with Dr. Madalyn Rob with critical care regarding patient case, he will plan to send someone to see the  patient tomorrow morning in  consult for possible bronchoscopy unless patient has repeat episodes of hemoptysis here in which case the patient can be seen more rapidly.  Final Clinical Impressions(s) / ED Diagnoses   Final diagnoses:  Elevated LFTs  Community acquired pneumonia of right lower lobe of lung ([place])  COPD exacerbation ([place])  Hemoptysis  Alcohol abuse  Vaginal bleeding  Suicidal ideations    ED Discharge Orders    None       Jacqlyn Larsen, [place] 02/19/18 0029    Jacqlyn Larsen, PA-C 02/19/18 0105    Quintella Reichert, MD 02/23/18 1006

## 2018-02-18 NOTE — ED Triage Notes (Signed)
Pt reports 4 days of vaginal bleeding, onset of bright red emesis today (4 episodes), and swelling/irritation in the rectal area from her dermitis. Dizziness with standing up. Pt denies blood thinners. Pt is tearful, reports various physical stressors and says she does not have a desire to live anymore

## 2018-02-18 NOTE — ED Notes (Signed)
Jenna Marquez is pt's POA and can be reached at [phone] for any information and pt's disposition.

## 2018-02-19 ENCOUNTER — Inpatient Hospital Stay (HOSPITAL_COMMUNITY): Payer: PPO

## 2018-02-19 ENCOUNTER — Other Ambulatory Visit: Payer: Self-pay

## 2018-02-19 ENCOUNTER — Ambulatory Visit: Payer: PPO | Admitting: Family Medicine

## 2018-02-19 DIAGNOSIS — Z888 Allergy status to other drugs, medicaments and biological substances status: Secondary | ICD-10-CM | POA: Diagnosis not present

## 2018-02-19 DIAGNOSIS — I739 Peripheral vascular disease, unspecified: Secondary | ICD-10-CM | POA: Diagnosis present

## 2018-02-19 DIAGNOSIS — J449 Chronic obstructive pulmonary disease, unspecified: Secondary | ICD-10-CM | POA: Diagnosis not present

## 2018-02-19 DIAGNOSIS — K760 Fatty (change of) liver, not elsewhere classified: Secondary | ICD-10-CM | POA: Diagnosis present

## 2018-02-19 DIAGNOSIS — N183 Chronic kidney disease, stage 3 (moderate): Secondary | ICD-10-CM | POA: Diagnosis present

## 2018-02-19 DIAGNOSIS — N390 Urinary tract infection, site not specified: Secondary | ICD-10-CM | POA: Diagnosis present

## 2018-02-19 DIAGNOSIS — I129 Hypertensive chronic kidney disease with stage 1 through stage 4 chronic kidney disease, or unspecified chronic kidney disease: Secondary | ICD-10-CM | POA: Diagnosis present

## 2018-02-19 DIAGNOSIS — I10 Essential (primary) hypertension: Secondary | ICD-10-CM | POA: Diagnosis not present

## 2018-02-19 DIAGNOSIS — R45851 Suicidal ideations: Secondary | ICD-10-CM | POA: Diagnosis present

## 2018-02-19 DIAGNOSIS — Z7982 Long term (current) use of aspirin: Secondary | ICD-10-CM | POA: Diagnosis not present

## 2018-02-19 DIAGNOSIS — K219 Gastro-esophageal reflux disease without esophagitis: Secondary | ICD-10-CM | POA: Diagnosis present

## 2018-02-19 DIAGNOSIS — R0489 Hemorrhage from other sites in respiratory passages: Secondary | ICD-10-CM | POA: Diagnosis present

## 2018-02-19 DIAGNOSIS — J181 Lobar pneumonia, unspecified organism: Secondary | ICD-10-CM | POA: Diagnosis present

## 2018-02-19 DIAGNOSIS — F419 Anxiety disorder, unspecified: Secondary | ICD-10-CM | POA: Diagnosis present

## 2018-02-19 DIAGNOSIS — J430 Unilateral pulmonary emphysema [MacLeod's syndrome]: Secondary | ICD-10-CM

## 2018-02-19 DIAGNOSIS — J189 Pneumonia, unspecified organism: Secondary | ICD-10-CM

## 2018-02-19 DIAGNOSIS — I441 Atrioventricular block, second degree: Secondary | ICD-10-CM | POA: Diagnosis present

## 2018-02-19 DIAGNOSIS — N939 Abnormal uterine and vaginal bleeding, unspecified: Secondary | ICD-10-CM | POA: Diagnosis not present

## 2018-02-19 DIAGNOSIS — D62 Acute posthemorrhagic anemia: Secondary | ICD-10-CM | POA: Diagnosis present

## 2018-02-19 DIAGNOSIS — I480 Paroxysmal atrial fibrillation: Secondary | ICD-10-CM | POA: Diagnosis present

## 2018-02-19 DIAGNOSIS — Z8249 Family history of ischemic heart disease and other diseases of the circulatory system: Secondary | ICD-10-CM | POA: Diagnosis not present

## 2018-02-19 DIAGNOSIS — L309 Dermatitis, unspecified: Secondary | ICD-10-CM | POA: Diagnosis present

## 2018-02-19 DIAGNOSIS — J441 Chronic obstructive pulmonary disease with (acute) exacerbation: Secondary | ICD-10-CM | POA: Diagnosis present

## 2018-02-19 DIAGNOSIS — F329 Major depressive disorder, single episode, unspecified: Secondary | ICD-10-CM | POA: Diagnosis present

## 2018-02-19 DIAGNOSIS — J438 Other emphysema: Secondary | ICD-10-CM | POA: Diagnosis not present

## 2018-02-19 DIAGNOSIS — Z950 Presence of cardiac pacemaker: Secondary | ICD-10-CM | POA: Diagnosis not present

## 2018-02-19 DIAGNOSIS — L299 Pruritus, unspecified: Secondary | ICD-10-CM | POA: Diagnosis present

## 2018-02-19 DIAGNOSIS — F322 Major depressive disorder, single episode, severe without psychotic features: Secondary | ICD-10-CM | POA: Diagnosis not present

## 2018-02-19 DIAGNOSIS — R042 Hemoptysis: Secondary | ICD-10-CM

## 2018-02-19 DIAGNOSIS — F1010 Alcohol abuse, uncomplicated: Secondary | ICD-10-CM | POA: Diagnosis present

## 2018-02-19 DIAGNOSIS — Z720 Tobacco use: Secondary | ICD-10-CM | POA: Diagnosis not present

## 2018-02-19 DIAGNOSIS — F10239 Alcohol dependence with withdrawal, unspecified: Secondary | ICD-10-CM | POA: Diagnosis present

## 2018-02-19 DIAGNOSIS — J309 Allergic rhinitis, unspecified: Secondary | ICD-10-CM | POA: Diagnosis present

## 2018-02-19 HISTORY — DX: Pneumonia, unspecified organism: J18.9

## 2018-02-19 HISTORY — DX: Hemoptysis: R04.2

## 2018-02-19 HISTORY — DX: Alcohol abuse, uncomplicated: F10.10

## 2018-02-19 HISTORY — DX: Abnormal uterine and vaginal bleeding, unspecified: N93.9

## 2018-02-19 LAB — COMPREHENSIVE METABOLIC PANEL
ALBUMIN: 2.2 g/dL — AB (ref 3.5–5.0)
ALK PHOS: 207 U/L — AB (ref 38–126)
ALT: 64 U/L — ABNORMAL HIGH (ref 0–44)
ANION GAP: 13 (ref 5–15)
AST: 218 U/L — ABNORMAL HIGH (ref 15–41)
BILIRUBIN TOTAL: 1.3 mg/dL — AB (ref 0.3–1.2)
BUN: 5 mg/dL — ABNORMAL LOW (ref 8–23)
CALCIUM: 7.6 mg/dL — AB (ref 8.9–10.3)
CO2: 27 mmol/L (ref 22–32)
Chloride: 95 mmol/L — ABNORMAL LOW (ref 98–111)
Creatinine, Ser: 0.7 mg/dL (ref 0.44–1.00)
Glucose, Bld: 90 mg/dL (ref 70–99)
POTASSIUM: 3.3 mmol/L — AB (ref 3.5–5.1)
Sodium: 135 mmol/L (ref 135–145)
TOTAL PROTEIN: 5.4 g/dL — AB (ref 6.5–8.1)

## 2018-02-19 LAB — CBC WITH DIFFERENTIAL/PLATELET
BASOS ABS: 0 10*3/uL (ref 0.0–0.1)
BASOS PCT: 0 %
Eosinophils Absolute: 0 10*3/uL (ref 0.0–0.7)
Eosinophils Relative: 0 %
HEMATOCRIT: 27.8 % — AB (ref 36.0–46.0)
Hemoglobin: 9.8 g/dL — ABNORMAL LOW (ref 12.0–15.0)
LYMPHS PCT: 9 %
Lymphs Abs: 0.6 10*3/uL — ABNORMAL LOW (ref 0.7–4.0)
MCH: 36.7 pg — ABNORMAL HIGH (ref 26.0–34.0)
MCHC: 35.3 g/dL (ref 30.0–36.0)
MCV: 104.1 fL — AB (ref 78.0–100.0)
Monocytes Absolute: 0.3 10*3/uL (ref 0.1–1.0)
Monocytes Relative: 5 %
NEUTROS ABS: 6.3 10*3/uL (ref 1.7–7.7)
Neutrophils Relative %: 86 %
PLATELETS: 266 10*3/uL (ref 150–400)
RBC: 2.67 MIL/uL — ABNORMAL LOW (ref 3.87–5.11)
RDW: 13.7 % (ref 11.5–15.5)
WBC: 7.4 10*3/uL (ref 4.0–10.5)

## 2018-02-19 LAB — HIV ANTIBODY (ROUTINE TESTING W REFLEX): HIV SCREEN 4TH GENERATION: NONREACTIVE

## 2018-02-19 LAB — URINALYSIS, ROUTINE W REFLEX MICROSCOPIC
BILIRUBIN URINE: NEGATIVE
GLUCOSE, UA: NEGATIVE mg/dL
KETONES UR: NEGATIVE mg/dL
NITRITE: POSITIVE — AB
PH: 6 (ref 5.0–8.0)
Protein, ur: NEGATIVE mg/dL
Specific Gravity, Urine: 1.005 (ref 1.005–1.030)
WBC, UA: >50 WBC/hpf — ABNORMAL HIGH (ref 0–5)

## 2018-02-19 LAB — CBC
HEMATOCRIT: 27 % — AB (ref 36.0–46.0)
HEMOGLOBIN: 9.6 g/dL — AB (ref 12.0–15.0)
MCH: 37.4 pg — ABNORMAL HIGH (ref 26.0–34.0)
MCHC: 35.6 g/dL (ref 30.0–36.0)
MCV: 105.1 fL — ABNORMAL HIGH (ref 78.0–100.0)
Platelets: 284 10*3/uL (ref 150–400)
RBC: 2.57 MIL/uL — ABNORMAL LOW (ref 3.87–5.11)
RDW: 13.7 % (ref 11.5–15.5)
WBC: 5.1 10*3/uL (ref 4.0–10.5)

## 2018-02-19 LAB — SALICYLATE LEVEL: Salicylate Lvl: <7 mg/dL (ref 2.8–30.0)

## 2018-02-19 LAB — RETICULOCYTES
RBC.: 2.57 MIL/uL — AB (ref 3.87–5.11)
RETIC COUNT ABSOLUTE: 46.3 10*3/uL (ref 19.0–186.0)
Retic Ct Pct: 1.8 % (ref 0.4–3.1)

## 2018-02-19 LAB — STREP PNEUMONIAE URINARY ANTIGEN: Strep Pneumo Urinary Antigen: NEGATIVE

## 2018-02-19 LAB — ACETAMINOPHEN LEVEL: Acetaminophen (Tylenol), Serum: <10 ug/mL — ABNORMAL LOW (ref 10–30)

## 2018-02-19 LAB — ABO/RH: ABO/RH(D): O NEG

## 2018-02-19 MED ORDER — HYDROXYZINE HCL 25 MG PO TABS
25.0000 mg | ORAL_TABLET | Freq: Three times a day (TID) | ORAL | Status: DC | PRN
  Administered 2018-02-19: 25 mg via ORAL
  Filled 2018-02-19: qty 1

## 2018-02-19 MED ORDER — ALPRAZOLAM 0.5 MG PO TABS
0.5000 mg | ORAL_TABLET | Freq: Three times a day (TID) | ORAL | Status: DC | PRN
  Administered 2018-02-20 – 2018-02-22 (×3): 0.5 mg via ORAL
  Filled 2018-02-19 (×4): qty 1

## 2018-02-19 MED ORDER — METHYLPREDNISOLONE SODIUM SUCC 125 MG IJ SOLR
60.0000 mg | Freq: Four times a day (QID) | INTRAMUSCULAR | Status: DC
  Administered 2018-02-19 – 2018-02-21 (×10): 60 mg via INTRAVENOUS
  Filled 2018-02-19 (×10): qty 2

## 2018-02-19 MED ORDER — THIAMINE HCL 100 MG/ML IJ SOLN
Freq: Once | INTRAVENOUS | Status: AC
  Administered 2018-02-19: 02:00:00 via INTRAVENOUS
  Filled 2018-02-19: qty 1000

## 2018-02-19 MED ORDER — IPRATROPIUM-ALBUTEROL 0.5-2.5 (3) MG/3ML IN SOLN: 3.0000 mL | Freq: Four times a day (QID) | RESPIRATORY_TRACT | Status: DC

## 2018-02-19 MED ORDER — VITAMIN D 1000 UNITS PO TABS
2000.0000 [IU] | ORAL_TABLET | Freq: Every day | ORAL | Status: DC
  Administered 2018-02-19 – 2018-02-22 (×4): 2000 [IU] via ORAL
  Filled 2018-02-19 (×4): qty 2

## 2018-02-19 MED ORDER — TRIAMCINOLONE ACETONIDE 0.1 % EX OINT
TOPICAL_OINTMENT | Freq: Two times a day (BID) | CUTANEOUS | Status: DC
  Administered 2018-02-19 – 2018-02-22 (×8): via TOPICAL
  Filled 2018-02-19 (×2): qty 80

## 2018-02-19 MED ORDER — SODIUM CHLORIDE 0.9 % IV SOLN
500.0000 mg | INTRAVENOUS | Status: DC
  Administered 2018-02-19 – 2018-02-21 (×3): 500 mg via INTRAVENOUS
  Filled 2018-02-19 (×4): qty 500

## 2018-02-19 MED ORDER — SODIUM CHLORIDE 0.9 % IV SOLN: 1.0000 g | INTRAVENOUS | Status: DC

## 2018-02-19 MED ORDER — FLUTICASONE FUROATE-VILANTEROL 200-25 MCG/INH IN AEPB
1.0000 | INHALATION_SPRAY | Freq: Every day | RESPIRATORY_TRACT | Status: DC
  Administered 2018-02-19 – 2018-02-22 (×3): 1 via RESPIRATORY_TRACT
  Filled 2018-02-19: qty 28

## 2018-02-19 MED ORDER — SODIUM CHLORIDE 0.9 % IV SOLN
1.5000 g | Freq: Four times a day (QID) | INTRAVENOUS | Status: DC
  Administered 2018-02-19 – 2018-02-22 (×13): 1.5 g via INTRAVENOUS
  Filled 2018-02-19 (×14): qty 1.5

## 2018-02-19 MED ORDER — ADULT MULTIVITAMIN W/MINERALS CH
2.0000 | ORAL_TABLET | Freq: Every morning | ORAL | Status: DC
  Administered 2018-02-19 – 2018-02-22 (×4): 2 via ORAL
  Filled 2018-02-19 (×4): qty 2

## 2018-02-19 MED ORDER — IPRATROPIUM-ALBUTEROL 0.5-2.5 (3) MG/3ML IN SOLN
3.0000 mL | Freq: Three times a day (TID) | RESPIRATORY_TRACT | Status: DC
  Administered 2018-02-19 – 2018-02-20 (×6): 3 mL via RESPIRATORY_TRACT
  Filled 2018-02-19 (×6): qty 3

## 2018-02-19 MED ORDER — CEPHALEXIN 500 MG PO CAPS: 500.0000 mg | ORAL_CAPSULE | Freq: Four times a day (QID) | ORAL | Status: DC

## 2018-02-19 MED ORDER — BENZONATATE 100 MG PO CAPS: 200.0000 mg | ORAL_CAPSULE | Freq: Three times a day (TID) | ORAL | Status: DC | PRN

## 2018-02-19 MED ORDER — CALCIUM CARBONATE-VITAMIN D 500-200 MG-UNIT PO TABS
1.0000 | ORAL_TABLET | Freq: Every day | ORAL | Status: DC
  Administered 2018-02-19 – 2018-02-22 (×4): 1 via ORAL
  Filled 2018-02-19 (×4): qty 1

## 2018-02-19 MED ORDER — NICOTINE 21 MG/24HR TD PT24
21.0000 mg | MEDICATED_PATCH | Freq: Every day | TRANSDERMAL | Status: DC
  Filled 2018-02-19 (×3): qty 1

## 2018-02-19 MED ORDER — IPRATROPIUM-ALBUTEROL 0.5-2.5 (3) MG/3ML IN SOLN
3.0000 mL | RESPIRATORY_TRACT | Status: DC | PRN
  Administered 2018-02-19 – 2018-02-21 (×5): 3 mL via RESPIRATORY_TRACT
  Filled 2018-02-19 (×5): qty 3

## 2018-02-19 MED ORDER — SODIUM CHLORIDE 0.9 % IV SOLN
INTRAVENOUS | Status: DC
  Administered 2018-02-20 – 2018-02-21 (×3): via INTRAVENOUS

## 2018-02-19 MED ORDER — METOPROLOL TARTRATE 25 MG PO TABS
25.0000 mg | ORAL_TABLET | Freq: Two times a day (BID) | ORAL | Status: DC
  Administered 2018-02-19 – 2018-02-22 (×6): 25 mg via ORAL
  Filled 2018-02-19 (×8): qty 1

## 2018-02-19 NOTE — ED Notes (Signed)
ED TO INPATIENT HANDOFF REPORT  Name/Age/Gender Jenna Marquez 72 y.o. female  Code Status Code Status History    Date Active Date Inactive Code Status Order ID Comments User Context   09/30/2016 0818 10/02/2016 1739 DNR [id number]  Orson Eva, MD Inpatient   05/11/2015 1130 05/12/2015 1442 DNR [id number]  Melton Alar, PA-C Inpatient   01/07/2015 1427 01/08/2015 1446 Full Code [id number]  Evans Lance, MD Inpatient   01/06/2015 2236 01/07/2015 1427 Full Code [id number]  Alwyn Pea, MD Inpatient   01/06/2015 1822 01/06/2015 2236 DNR [id number]  Fredia Sorrow, MD ED    Questions for Most Recent Historical Code Status (Order [id number])    Question Answer Comment   In the event of cardiac or respiratory ARREST Do not call a "code blue"    In the event of cardiac or respiratory ARREST Do not perform Intubation, CPR, defibrillation or ACLS    In the event of cardiac or respiratory ARREST Use medication by any route, position, wound care, and other measures to relive pain and suffering. May use oxygen, suction and manual treatment of airway obstruction as needed for comfort.       Home/SNF/Other Home  Chief Complaint vomiting blood / vag bleeding   Level of Care/Admitting Diagnosis ED Disposition    ED Disposition Condition Comment   Admit  Hospital Area: [facility] [[id number]]  Level of Care: Telemetry [5]  Admit to tele based on following criteria: Other see comments  Comments: hemoptysis  Diagnosis: Pneumonia [[id number]]  Admitting Physician: Elwyn Reach [[id number]]  Attending Physician: Elwyn Reach [[id number]]  Estimated length of stay: past midnight tomorrow  Certification:: I certify this patient will need inpatient services for at least 2 midnights  PT Class (Do Not Modify): Inpatient [101]  PT Acc Code (Do Not Modify): Private [1]       Medical History Past Medical History:  Diagnosis Date  . Allergic rhinitis   . Anxiety    "occasionally"  .  Asthma   . COPD (chronic obstructive pulmonary disease) ([place])   . GERD (gastroesophageal reflux disease)   . Mobitz type 2 second degree AV block 01/06/2015    Allergies Allergies  Allergen Reactions  . Avelox [Moxifloxacin Hcl In Nacl] Itching and Rash    IV Location/Drains/Wounds Patient Lines/Drains/Airways Status   Active Line/Drains/Airways    Name:   Placement date:   Placement time:   Site:   Days:   Peripheral IV 02/18/18 Right;Medial Forearm   02/18/18    2112    Forearm   1          Labs/Imaging Results for orders placed or performed during the hospital encounter of 02/18/18 (from the past 48 hour(s))  Comprehensive metabolic panel     Status: Abnormal   Collection Time: 02/18/18  9:14 PM  Result Value Ref Range   Sodium 142 135 - 145 mmol/L   Potassium 3.6 3.5 - 5.1 mmol/L   Chloride 99 98 - 111 mmol/L    Comment: Please note change in reference range.   CO2 25 22 - 32 mmol/L   Glucose, Bld 72 70 - 99 mg/dL    Comment: Please note change in reference range.   BUN 5 (L) 8 - 23 mg/dL    Comment: Please note change in reference range.   Creatinine, Ser 0.78 0.44 - 1.00 mg/dL   Calcium 8.6 (L) 8.9 - 10.3 mg/dL   Total Protein 6.2 (L) 6.5 - 8.1 g/dL  Albumin 2.6 (L) 3.5 - 5.0 g/dL   AST 282 (H) 15 - 41 U/L   ALT 78 (H) 0 - 44 U/L    Comment: Please note change in reference range.   Alkaline Phosphatase 256 (H) 38 - 126 U/L   Total Bilirubin 1.5 (H) 0.3 - 1.2 mg/dL   GFR calc non Af Amer >60 >60 mL/min   GFR calc Af Amer >60 >60 mL/min    Comment: (NOTE) The eGFR has been calculated using the CKD EPI equation. This calculation has not been validated in all clinical situations. eGFR's persistently <60 mL/min signify possible Chronic Kidney Disease.    Anion gap 18 (H) 5 - 15    Comment: Performed at [facility], [place] [address], [place], [place] [address]  CBC     Status: Abnormal   Collection Time: 02/18/18  9:14 PM  Result Value Ref  Range   WBC 8.0 4.0 - 10.5 K/uL   RBC 3.10 (L) 3.87 - 5.11 MIL/uL   Hemoglobin 11.6 (L) 12.0 - 15.0 g/dL   HCT 32.8 (L) 36.0 - 46.0 %   MCV 105.8 (H) 78.0 - 100.0 fL   MCH 37.4 (H) 26.0 - 34.0 pg   MCHC 35.4 30.0 - 36.0 g/dL   RDW 13.7 11.5 - 15.5 %   Platelets 327 150 - 400 K/uL    Comment: Performed at [facility], [place] [address], [place], [place] [address]  Type and screen [place]     Status: None   Collection Time: 02/18/18  9:14 PM  Result Value Ref Range   ABO/RH(D) O NEG    Antibody Screen NEG    Sample Expiration      02/21/2018 Performed at [facility], [place] [address] [place], [place] [address]   Brain natriuretic peptide     Status: Abnormal   Collection Time: 02/18/18  9:14 PM  Result Value Ref Range   B Natriuretic Peptide 136.4 (H) 0.0 - 100.0 pg/mL    Comment: Performed at [facility], [place] [address] [place], [place] [address]  Wet prep, genital     Status: Abnormal   Collection Time: 02/18/18  9:14 PM  Result Value Ref Range   Yeast Wet Prep HPF POC NONE SEEN NONE SEEN   Trich, Wet Prep NONE SEEN NONE SEEN   Clue Cells Wet Prep HPF POC NONE SEEN NONE SEEN   WBC, Wet Prep HPF POC MODERATE (A) NONE SEEN   Sperm NONE SEEN     Comment: Performed at [facility], [place] [address] [place], [place] [address]  Protime-INR     Status: None   Collection Time: 02/18/18  9:14 PM  Result Value Ref Range   Prothrombin Time 12.8 11.4 - 15.2 seconds   INR 0.97     Comment: Performed at [facility], [place] [address], [place], [place] [address]  APTT     Status: None   Collection Time: 02/18/18  9:14 PM  Result Value Ref Range   aPTT 29 24 - 36 seconds    Comment: Performed at [facility], [place] [address] [place],  [address]  Ethanol     Status: Abnormal   Collection Time: 02/18/18  9:30 PM  Result Value Ref Range   Alcohol,  Ethyl (B) 41 (H) <10 mg/dL    Comment: (NOTE) Lowest detectable limit for serum alcohol is 10 mg/dL. For medical purposes  only. Performed at [facility], [place] [address], [place], [place] [address]   Acetaminophen level     Status: Abnormal   Collection Time: 02/18/18 10:48 PM  Result Value Ref Range   Acetaminophen (Tylenol), Serum <10 (L) 10 - 30 ug/mL    Comment: (NOTE) Therapeutic concentrations vary significantly. A range of 10-30 ug/mL  may be an effective concentration for many patients. However, some  are best treated at concentrations outside of this range. Acetaminophen concentrations >150 ug/mL at 4 hours after ingestion  and >50 ug/mL at 12 hours after ingestion are often associated with  toxic reactions. Performed at [facility], [place] [address], [place], [place] [address]   Salicylate level     Status: None   Collection Time: 02/18/18 10:48 PM  Result Value Ref Range   Salicylate Lvl <6.5 2.8 - 30.0 mg/dL    Comment: Performed at [facility], [place] [address] [place], [place] [address]  Urinalysis, Routine w reflex microscopic     Status: Abnormal   Collection Time: 02/18/18 11:57 PM  Result Value Ref Range   Color, Urine AMBER (A) YELLOW    Comment: BIOCHEMICALS MAY BE AFFECTED BY COLOR   APPearance CLOUDY (A) CLEAR   Specific Gravity, Urine 1.005 1.005 - 1.030   pH 6.0 5.0 - 8.0   Glucose, UA NEGATIVE NEGATIVE mg/dL   Hgb urine dipstick LARGE (A) NEGATIVE   Bilirubin Urine NEGATIVE NEGATIVE   Ketones, ur NEGATIVE NEGATIVE mg/dL   Protein, ur NEGATIVE NEGATIVE mg/dL   Nitrite POSITIVE (A) NEGATIVE   Leukocytes, UA LARGE (A) NEGATIVE   RBC / HPF >50 (H) 0 - 5 RBC/hpf   WBC, UA >50 (H) 0 - 5 WBC/hpf   Bacteria, UA MANY (A) NONE SEEN   Squamous Epithelial / LPF 0-5 0 - 5   WBC Clumps PRESENT     Comment: Performed at [facility], [place] [address] [place], [place] [address]   Ct  Angio Chest Pe W And/or Wo Contrast  Result Date: 02/18/2018 CLINICAL DATA:  Hemoptysis mild-to-moderate, hematemesis, not on blood thinners, history COPD, hypertension, has had vaginal bleeding for 4 days EXAM: CT ANGIOGRAPHY CHEST WITH CONTRAST TECHNIQUE: Multidetector CT imaging of the chest was performed using the standard protocol during bolus administration of intravenous contrast. Multiplanar CT image reconstructions and MIPs were obtained to evaluate the vascular anatomy. CONTRAST:  72m ISOVUE-370 IOPAMIDOL (ISOVUE-370) INJECTION 76% IV COMPARISON:  09/10/2015 FINDINGS: Cardiovascular: LEFT subclavian transvenous pacemaker leads at RIGHT atrium and RIGHT ventricle. Atherosclerotic calcifications aorta, coronary arteries, and proximal great vessels. Aorta normal caliber without aneurysm or dissection. A small lymph node is again identified at the bifurcation of the lingular pulmonary artery. No pulmonary emboli are identified. No pericardial effusion. Mediastinum/Nodes: Esophagus normal appearance. Base of cervical region normal appearance. No thoracic adenopathy. 8 mm short axis precarinal lymph node noted. Lungs/Pleura: Emphysematous changes. Significant RIGHT lower lobe opacity favor representing pneumonia. Central peribronchial thickening. Remaining lungs clear. No pleural effusion or pneumothorax. Upper Abdomen: Marked fatty infiltration of liver. Remaining visualized upper abdomen unremarkable old healed Musculoskeletal: Diffuse osseous demineralization. Review of the MIP images confirms the above findings. IMPRESSION: No evidence of pulmonary embolism. Atherosclerotic calcifications including coronary arteries. Emphysematous and bronchitic changes consistent with COPD. Opacification of the central portions of the RIGHT lower lobe favoring pneumonia, though pulmonary hemorrhage could have a similar appearance. Follow-up evaluation until resolution recommended to ensure resolution and exclude  underlying abnormalities including  tumor. Marked fatty infiltration of liver. Aortic Atherosclerosis (ICD10-I70.0) and Emphysema (ICD10-J43.9). Electronically Signed   By: Lavonia Dana M.D.   On: 02/18/2018 23:07   US Abdomen Limited Ruq  Result Date: 02/19/2018 CLINICAL DATA:  Initial evaluation for elevated LFTs. EXAM: ULTRASOUND ABDOMEN LIMITED RIGHT UPPER QUADRANT COMPARISON:  Prior ultrasound from 12/02/2016 FINDINGS: Gallbladder: No gallstones or wall thickening visualized. No sonographic Murphy sign noted by sonographer. Common bile duct: Diameter: 2.6 mm Liver: No focal lesion identified. Diffusely increased hepatic echogenicity, suggestive of steatosis. Portal vein is patent on color Doppler imaging with normal direction of blood flow towards the liver. IMPRESSION: 1. No cholelithiasis, evidence for acute cholecystitis, or biliary dilatation. 2. Increased hepatic echogenicity, suggesting steatosis. Electronically Signed   By: Jeannine Boga M.D.   On: 02/19/2018 00:24    Pending Labs Unresulted Labs (From admission, onward)   Start     Ordered   02/18/18 2114  ABO/Rh  Once,   R     02/18/18 2114   02/18/18 2055  Urine culture  STAT,   STAT     02/18/18 2054   Signed and Held  Culture, blood (routine x 2) Call MD if unable to obtain prior to antibiotics being given  BLOOD CULTURE X 2,   R    Comments:  If blood cultures drawn in Emergency Department - Do not draw and cancel order    Signed and Held   Signed and Held  Culture, sputum-assessment  Once,   R     Signed and Held   Signed and Held  Gram stain  Once,   R     Signed and Held   Signed and Held  HIV antibody (Routine Screening)  Once,   R     Signed and Held   Signed and Held  Strep pneumoniae urinary antigen  Once,   R     Signed and Held   Signed and Held  Comprehensive metabolic panel  Tomorrow morning,   R     Signed and Held   Signed and Held  CBC WITH DIFFERENTIAL  Tomorrow morning,   R     Signed and Held    Signed and Held  Comprehensive metabolic panel  Tomorrow morning,   R     Signed and Held      Vitals/Pain Today's Vitals   02/18/18 2330 02/18/18 2335 02/18/18 2345 02/19/18 0000  BP:  (!) 167/66 (!) 167/66 (!) 154/67  Pulse:  (!) 124 (!) 113 (!) 117  Resp: (!) 22  (!) 21 16  Temp:      TempSrc:      SpO2:   100% 96%  Weight:      Height:      PainSc:        Isolation Precautions No active isolations  Medications Medications  LORazepam (ATIVAN) injection 0-4 mg (2 mg Intravenous Given 02/18/18 2343)    Or  LORazepam (ATIVAN) tablet 0-4 mg ( Oral See Alternative 02/18/18 2343)  LORazepam (ATIVAN) injection 0-4 mg (has no administration in time range)    Or  LORazepam (ATIVAN) tablet 0-4 mg (has no administration in time range)  thiamine (VITAMIN B-1) tablet 100 mg (has no administration in time range)    Or  thiamine (B-1) injection 100 mg (has no administration in time range)  hydrOXYzine (ATARAX/VISTARIL) tablet 25 mg (25 mg Oral Given 02/18/18 2033)  iopamidol (ISOVUE-370) 76 % injection 100 mL (80 mLs Intravenous Contrast Given 02/18/18 2238)  iopamidol (ISOVUE-370) 76 % injection (  Contrast Given 02/18/18 2320)  cefTRIAXone (ROCEPHIN) 1 g in sodium chloride 0.9 % 100 mL IVPB (0 g Intravenous Stopped 02/19/18 0016)  azithromycin (ZITHROMAX) 500 mg in sodium chloride 0.9 % 250 mL IVPB (0 mg Intravenous Stopped 02/19/18 0046)  ipratropium-albuterol (DUONEB) 0.5-2.5 (3) MG/3ML nebulizer solution 3 mL (3 mLs Nebulization Given 02/18/18 2341)  methylPREDNISolone sodium succinate (SOLU-MEDROL) 125 mg/2 mL injection 125 mg (125 mg Intravenous Given 02/18/18 2349)    Mobility walks with person assist

## 2018-02-19 NOTE — Progress Notes (Signed)
[facility] PLAN OF CARE NOTE Patient: Jenna Marquez NPY:[id number]   PCP: Darreld Mclean, MD DOB: 06-11-1946   DOA: 02/18/2018   DOS: 02/19/2018    Patient was admitted by my colleague Dr. Jonelle Sidle earlier on 02/19/2018. I have reviewed the H&P as well as assessment and plan and agree with the same. Important changes in the plan are listed below.  Plan of care: Principal Problem:   Community acquired pneumonia Active Problems:   COPD (chronic obstructive pulmonary disease) gold stage C.   Tobacco abuse   CKD (chronic kidney disease) stage 3, GFR 30-59 ml/min (HCC)   Paroxysmal atrial fibrillation (HCC)   HTN (hypertension)   Hemoptysis   Vagina bleeding   Alcohol abuse   Pneumonia Postmenopausal vaginal bleeding. 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 H&H at present is relatively stable. Patient will need outpatient OB/GYN follow-up.  Hemoptysis Bronchoscopy scheduled on 02/21/2018.  Author: Berle Mull, MD [facility] Pager: [phone] 02/19/2018 4:35 PM   If 7PM-7AM, please contact night-coverage at [email or web address], password [facility]

## 2018-02-19 NOTE — H&P (Signed)
History and Physical    Jenna Marquez UEA:[id number] DOB: 08-24-1945 DOA: 02/18/2018  Referring MD/NP/PA: Benedetto Goad, PA PCP: Darreld Mclean, MD   Outpatient Specialists: None   Patient coming from: Home  Chief Complaint: Hemoptysis  HPI: Jenna Marquez is a 72 y.o. female with medical history significant of alcohol abuse, COPD, paroxysmal atrial fibrillation with pacemaker in place not on any anticoagulation, chronic kidney disease stage III, hypertension as well as GERD who presented to the ER with multiple episodes of hemoptysis at home describes it as a teaspoon size.  Associated with cough but no fever or chills.  Patient also reported episode of vaginal bleeding.  She is not on her.  At the moment.  She has shortness of breath at rest.  She has been drinking alcohol up until yesterday.  She also has ongoing tobacco abuse with history of COPD and occasional exacerbation.  She has baseline wheezing and shortness of breath use her inhalers.  Patient's vaginal bleeding is spotty.  She has not had.  For almost 20 years.  No abdominal pain.  No melena no bright red blood per rectum.  She denied any significant chest pain.  Patient was evaluated in the ER with findings suggestive of pneumonia although pulmonary hemorrhage cannot be excluded.  Patient also has  dermatitis for which she is on treatment with steroid.  Patient also has severe depression.  She reports other nonspecific complaints including generalized body aches.  ED Course: Patient did not have any hemoptysis in the ER, temperature is 97 for blood pressure 167/66 pulse 117 respirate of 22 oxygen sat 100% room air.  Her white count is 8.0 with hemoglobin 11.6 platelets 327.  Sodium is 142 with potassium 3.6 chloride 99 CO2 25 BUN of 5 and creatinine 0.78 calcium 8.6.  INR is 0.97.  CT angiogram of the chest showed no PE.  Opacification of the central portion of the right lower lobe favoring pneumonia although pulmonary hemorrhage could  have a similar appearance.  Abdominal ultrasound showed no acute findings except for hepatic steatosis.  Alcohol level is 41.  Patient started on azithromycin and ceftriaxone and hospitalist called for admission.  Review of Systems: As per HPI otherwise 10 point review of systems negative.   Past Medical History:  Diagnosis Date  . Allergic rhinitis   . Anxiety    "occasionally"  . Asthma   . COPD (chronic obstructive pulmonary disease) ([place])   . GERD (gastroesophageal reflux disease)   . Mobitz type 2 second degree AV block 01/06/2015    Past Surgical History:  Procedure Laterality Date  . [place]  . EP IMPLANTABLE DEVICE N/A 01/07/2015   Procedure: Pacemaker Implant;  Surgeon: Evans Lance, MD;  Location: [facility];  Service: Cardiovascular;  Laterality: N/A;  . PACEMAKER INSERTION    . [place]     reports that she quit smoking about 5 years ago. Her smoking use included cigarettes. She has a 35.00 pack-year smoking history. She has never used smokeless tobacco. She reports that she drinks about 16.8 oz of alcohol per week. She reports that she does not use drugs.  Allergies  Allergen Reactions  . Avelox [Moxifloxacin Hcl In Nacl] Itching and Rash    Family History  Problem Relation Age of Onset  . Coronary artery disease Mother   . Heart disease Mother        CHF  . Diabetes Neg Hx   .  Cancer Neg Hx   . Stroke Neg Hx   . Colon cancer Neg Hx     Prior to Admission medications   Medication Sig Start Date End Date Taking? Authorizing Provider  albuterol (PROAIR HFA) 108 (90 Base) MCG/ACT inhaler Inhale 1-2 puffs into the lungs every 6 (six) hours as needed for wheezing or shortness of breath. 12/21/17  Yes Parrett, Tammy S, NP  albuterol (PROVENTIL) (2.5 MG/3ML) 0.083% nebulizer solution Take 3 mLs (2.5 mg total) by nebulization every 6 (six) hours as needed for wheezing or shortness of breath. 03/31/17  Yes  Copland, Gay Filler, MD  ALPRAZolam (XANAX) 0.5 MG tablet Take 1 tablet (0.5 mg total) by mouth every 8 (eight) hours as needed for anxiety (**DO NOT TAKE AT NIGHT WITH HYDROXYZINE**). Do not take with alcohol 07/04/17  Yes Saguier, Percell Miller, PA-C  benzonatate (TESSALON) 200 MG capsule Take 1 capsule (200 mg total) by mouth 3 (three) times daily as needed for cough. 12/21/17 12/21/18 Yes Parrett, Tammy S, NP  budesonide-formoterol (SYMBICORT) 160-4.5 MCG/ACT inhaler Inhale 2 puffs into the lungs 2 (two) times daily. 10/27/17  Yes Parrett, Tammy S, NP  cephALEXin (KEFLEX) 500 MG capsule Take 1 capsule (500 mg total) by mouth 4 (four) times daily. 12/19/17  Yes Deno Etienne, DO  hydrOXYzine (ATARAX/VISTARIL) 10 MG tablet TAKE 2 TABLETS BY MOUTH AS NEEDED FOR ITCHING. DO NOT TAKE WITH ALPRAZOLAM 08/04/17  Yes Saguier, Percell Miller, PA-C  ipratropium (ATROVENT) 0.02 % nebulizer solution Take 2.5 mLs (0.5 mg total) by nebulization every 6 (six) hours as needed for wheezing or shortness of breath. 12/21/17  Yes Parrett, Tammy S, NP  triamcinolone ointment (KENALOG) 0.1 % APPLY TO AFFECTED AREA TWICE A DAY 07/10/17  Yes [provider]  albuterol (PROVENTIL HFA;VENTOLIN HFA) 108 (90 Base) MCG/ACT inhaler Inhale 2 puffs into the lungs every 6 (six) hours as needed for wheezing or shortness of breath. Patient not taking: Reported on 02/18/2018 10/02/16   Nita Sells, MD  aspirin EC 81 MG EC tablet Take 1 tablet (81 mg total) by mouth daily. 10/02/16   Nita Sells, MD  calcium-vitamin D (OSCAL WITH D) 250-125 MG-UNIT tablet Take 1 tablet by mouth daily.    [provider]  Cholecalciferol (VITAMIN D) 2000 UNITS CAPS Take 1 capsule by mouth daily.    [provider]  hydrochlorothiazide (HYDRODIURIL) 25 MG tablet TAKE 1/2 OR 1 DAILY AS NEEDED FOR SWELLING OF LEGS Patient not taking: Reported on 02/18/2018 12/25/17   Copland, Gay Filler, MD  ibuprofen (ADVIL,MOTRIN) 800 MG tablet Take 1 tablet  (800 mg total) by mouth 3 (three) times daily. 08/31/17   Copland, Gay Filler, MD  metoprolol tartrate (LOPRESSOR) 25 MG tablet Take 1 tablet (25 mg total) by mouth 2 (two) times daily. 10/10/17 01/08/18  Camnitz, Ocie Doyne, MD  Multiple Vitamin (MULTIVITAMIN) tablet Take 2 tablets by mouth every morning.     [provider]  predniSONE (DELTASONE) 20 MG tablet 2 tabs po daily x 4 days Patient not taking: Reported on 02/18/2018 12/19/17   Deno Etienne, DO    Physical Exam: Vitals:   02/18/18 2315 02/18/18 2330 02/18/18 2345 02/19/18 0000  BP:   (!) 167/66 (!) 154/67  Pulse:   (!) 113 (!) 117  Resp: (!) 21 (!) 22 (!) 21 16  Temp:      TempSrc:      SpO2:   100% 96%  Weight:      Height:  Constitutional: NAD, calm, comfortable Vitals:   02/18/18 2315 02/18/18 2330 02/18/18 2345 02/19/18 0000  BP:   (!) 167/66 (!) 154/67  Pulse:   (!) 113 (!) 117  Resp: (!) 21 (!) 22 (!) 21 16  Temp:      TempSrc:      SpO2:   100% 96%  Weight:      Height:       Eyes: PERRL, lids and conjunctivae normal ENMT: Mucous membranes are moist. Posterior pharynx clear of any exudate or lesions.Normal dentition.  Neck: normal, supple, no masses, no thyromegaly Respiratory: Decreased air entry bilaterally with some mild expiratory wheezing no crackles. Normal respiratory effort. No accessory muscle use.  Cardiovascular: Regular rate and rhythm, no murmurs / rubs / gallops. No extremity edema. 2+ pedal pulses. No carotid bruits.  Abdomen: no tenderness, no masses palpated. No hepatosplenomegaly. Bowel sounds positive.  Musculoskeletal: no clubbing / cyanosis. No joint deformity upper and lower extremities. Good ROM, no contractures. Normal muscle tone.  Skin: no rashes, lesions, ulcers. No induration Neurologic: CN 2-12 grossly intact. Sensation intact, DTR normal. Strength 5/5 in all 4.  Psychiatric: Normal judgment and insight. Alert and oriented x 3.  Patient is visibly depressed   Labs  on Admission: I have personally reviewed following labs and imaging studies  CBC: Recent Labs  Lab 02/18/18 2114  WBC 8.0  HGB 11.6*  HCT 32.8*  MCV 105.8*  PLT 595   Basic Metabolic Panel: Recent Labs  Lab 02/18/18 2114  NA 142  K 3.6  CL 99  CO2 25  GLUCOSE 72  BUN 5*  CREATININE 0.78  CALCIUM 8.6*   GFR: Estimated Creatinine Clearance: 57.8 mL/min (by C-G formula based on SCr of 0.78 mg/dL). Liver Function Tests: Recent Labs  Lab 02/18/18 2114  AST 282*  ALT 78*  ALKPHOS 256*  BILITOT 1.5*  PROT 6.2*  ALBUMIN 2.6*   No results for input(s): LIPASE, AMYLASE in the last 168 hours. No results for input(s): AMMONIA in the last 168 hours. Coagulation Profile: Recent Labs  Lab 02/18/18 2114  INR 0.97   Cardiac Enzymes: No results for input(s): CKTOTAL, CKMB, CKMBINDEX, TROPONINI in the last 168 hours. BNP (last 3 results) No results for input(s): PROBNP in the last 8760 hours. HbA1C: No results for input(s): HGBA1C in the last 72 hours. CBG: No results for input(s): GLUCAP in the last 168 hours. Lipid Profile: No results for input(s): CHOL, HDL, LDLCALC, TRIG, CHOLHDL, LDLDIRECT in the last 72 hours. Thyroid Function Tests: No results for input(s): TSH, T4TOTAL, FREET4, T3FREE, THYROIDAB in the last 72 hours. Anemia Panel: No results for input(s): VITAMINB12, FOLATE, FERRITIN, TIBC, IRON, RETICCTPCT in the last 72 hours. Urine analysis:    Component Value Date/Time   COLORURINE YELLOW 12/19/2017 1115   APPEARANCEUR CLEAR 12/19/2017 1115   LABSPEC 1.015 12/19/2017 1115   PHURINE 6.0 12/19/2017 1115   GLUCOSEU NEGATIVE 12/19/2017 1115   HGBUR MODERATE (A) 12/19/2017 1115   BILIRUBINUR NEGATIVE 12/19/2017 1115   KETONESUR NEGATIVE 12/19/2017 1115   PROTEINUR NEGATIVE 12/19/2017 1115   UROBILINOGEN 1.0 05/22/2012 2229   NITRITE NEGATIVE 12/19/2017 1115   LEUKOCYTESUR MODERATE (A) 12/19/2017 1115   Sepsis  Labs: @LABRCNTIP (procalcitonin:4,lacticidven:4) ) Recent Results (from the past 240 hour(s))  Wet prep, genital     Status: Abnormal   Collection Time: 02/18/18  9:14 PM  Result Value Ref Range Status   Yeast Wet Prep HPF POC NONE SEEN NONE SEEN Final   Trich,  Wet Prep NONE SEEN NONE SEEN Final   Clue Cells Wet Prep HPF POC NONE SEEN NONE SEEN Final   WBC, Wet Prep HPF POC MODERATE (A) NONE SEEN Final   Sperm NONE SEEN  Final    Comment: Performed at [facility], [place] [address], [place], [place] [address]     Radiological Exams on Admission: Ct Angio Chest Pe W And/or Wo Contrast  Result Date: 02/18/2018 CLINICAL DATA:  Hemoptysis mild-to-moderate, hematemesis, not on blood thinners, history COPD, hypertension, has had vaginal bleeding for 4 days EXAM: CT ANGIOGRAPHY CHEST WITH CONTRAST TECHNIQUE: Multidetector CT imaging of the chest was performed using the standard protocol during bolus administration of intravenous contrast. Multiplanar CT image reconstructions and MIPs were obtained to evaluate the vascular anatomy. CONTRAST:  48mL ISOVUE-370 IOPAMIDOL (ISOVUE-370) INJECTION 76% IV COMPARISON:  09/10/2015 FINDINGS: Cardiovascular: LEFT subclavian transvenous pacemaker leads at RIGHT atrium and RIGHT ventricle. Atherosclerotic calcifications aorta, coronary arteries, and proximal great vessels. Aorta normal caliber without aneurysm or dissection. A small lymph node is again identified at the bifurcation of the lingular pulmonary artery. No pulmonary emboli are identified. No pericardial effusion. Mediastinum/Nodes: Esophagus normal appearance. Base of cervical region normal appearance. No thoracic adenopathy. 8 mm short axis precarinal lymph node noted. Lungs/Pleura: Emphysematous changes. Significant RIGHT lower lobe opacity favor representing pneumonia. Central peribronchial thickening. Remaining lungs clear. No pleural effusion or pneumothorax. Upper Abdomen: Marked fatty  infiltration of liver. Remaining visualized upper abdomen unremarkable old healed Musculoskeletal: Diffuse osseous demineralization. Review of the MIP images confirms the above findings. IMPRESSION: No evidence of pulmonary embolism. Atherosclerotic calcifications including coronary arteries. Emphysematous and bronchitic changes consistent with COPD. Opacification of the central portions of the RIGHT lower lobe favoring pneumonia, though pulmonary hemorrhage could have a similar appearance. Follow-up evaluation until resolution recommended to ensure resolution and exclude underlying abnormalities including tumor. Marked fatty infiltration of liver. Aortic Atherosclerosis (ICD10-I70.0) and Emphysema (ICD10-J43.9). Electronically Signed   By: Lavonia Dana M.D.   On: 02/18/2018 23:07   US Abdomen Limited Ruq  Result Date: 02/19/2018 CLINICAL DATA:  Initial evaluation for elevated LFTs. EXAM: ULTRASOUND ABDOMEN LIMITED RIGHT UPPER QUADRANT COMPARISON:  Prior ultrasound from 12/02/2016 FINDINGS: Gallbladder: No gallstones or wall thickening visualized. No sonographic Murphy sign noted by sonographer. Common bile duct: Diameter: 2.6 mm Liver: No focal lesion identified. Diffusely increased hepatic echogenicity, suggestive of steatosis. Portal vein is patent on color Doppler imaging with normal direction of blood flow towards the liver. IMPRESSION: 1. No cholelithiasis, evidence for acute cholecystitis, or biliary dilatation. 2. Increased hepatic echogenicity, suggesting steatosis. Electronically Signed   By: Jeannine Boga M.D.   On: 02/19/2018 00:24    EKG: Independently reviewed.  It shows paced rhythm with a rate of 101.  Possible right bundle branch block with no specific ST changes.  Assessment/Plan Principal Problem:   Community acquired pneumonia Active Problems:   COPD (chronic obstructive pulmonary disease) gold stage C.   Tobacco abuse   CKD (chronic kidney disease) stage 3, GFR 30-59 ml/min  (HCC)   Paroxysmal atrial fibrillation (HCC)   HTN (hypertension)   Hemoptysis   Vagina bleeding   Alcohol abuse   Pneumonia     #1 hemoptysis: Differentials include pneumonia in the alcoholic setting which could be mixed bacteria.  PE has been ruled out.  Pulmonary hemorrhage likely but patient hemodynamically stable,.  Not on any anticoagulation, PT/INR appears stable.  Platelet count is normal.  Patient will be observed on telemetry.  Pulmonary may need to be consulted for possible bronchoscopy if further hemoptysis occurs.  #2 right lower lobe pneumonia: Suspected aspiration in the patient was history of alcohol abuse: We will continue antibiotics for community-acquired pneumonia for now.  Monitor patient on telemetry.  Oxygen as needed.  Nebulizer treatment as well as follow cultures of sputum and blood.  #3 alcohol abuse: Counseling will continue to be provided.  CIWA a protocol to be initiated as soon as possible.  #4 tobacco abuse: Counseling provided.  Nicotine patch will be added.  #5 COPD: She has mild expiratory wheezing which could be her baseline.  We will initiate steroids with ongoing antibiotics and nebulizer treatments.  #6 vaginal bleeding: This is spotty.  At her age endometrial cancer is a concern.  Will recommend outpatient follow-up with OB/GYN.  #7 paroxysmal atrial fibrillation: Patient is in paced rhythm.  High risk for anticoagulation.  Continue rate control  #8 chronic kidney disease stage III: Appears to be at baseline.  Continue to monitor   DVT prophylaxis: SCD  Code Status: Full code  Family Communication: Great nephew who is at bedside.  He is power of attorney Disposition Plan: To be determined  Consults called: Pulmonary called by ER  Admission status: Inpatient to telemetry  Severity of Illness: The appropriate patient status for this patient is INPATIENT. Inpatient status is judged to be reasonable and necessary in order to provide the required  intensity of service to ensure the patient's safety. The patient's presenting symptoms, physical exam findings, and initial radiographic and laboratory data in the context of their chronic comorbidities is felt to place them at high risk for further clinical deterioration. Furthermore, it is not anticipated that the patient will be medically stable for discharge from the hospital within 2 midnights of admission. The following factors support the patient status of inpatient.   " The patient's presenting symptoms include hemoptysis and chills. " The worrisome physical exam findings include mild expiratory wheezing. " The initial radiographic and laboratory data are worrisome because of right lower lobe consolidation. " The chronic co-morbidities include COPD with history of alcohol abuse.   * I certify that at the point of admission it is my clinical judgment that the patient will require inpatient hospital care spanning beyond 2 midnights from the point of admission due to high intensity of service, high risk for further deterioration and high frequency of surveillance required.Barbette Merino MD [facility] Pager [phone]  If 7PM-7AM, please contact night-coverage [email or web address] Password [facility]  02/19/2018, 12:37 AM

## 2018-02-19 NOTE — Consult Note (Signed)
PULMONARY / CRITICAL CARE MEDICINE   Name: Jenna Marquez MRN: [id number] DOB: [birth year]    ADMISSION DATE:  02/18/2018 CONSULTATION DATE:  02/18/2018  REFERRING MD: Dr. Jonelle Sidle  CHIEF COMPLAINT: Hemoptysis  HISTORY OF PRESENT ILLNESS:   This is a pleasant 72 year old female who has a past medical history significant for COPD, tobacco abuse and alcohol abuse who came to the [facility] long emergency department complaining of hemoptysis and vaginal bleeding.  The patient states that she was in her usual state of health until 1 week prior to admission when she noted the onset of passing bloody menses for the first time in 20 years.  Several days after this she started coughing up blood.  She said she started coughing up a tablespoon once or twice a day with clots 4 days prior to admission.  This has persisted ever since.  She denies any change in her baseline shortness of breath.  She has been producing mucus but no more than typical.  She denies fevers or chills.  She says that she has experienced nosebleeds in the past but this is not been a problem for her lately.  She has had some trouble swallowing over the last several months but she attributes this to a lack of taste rather than any sort of pain or physical obstruction.  She denies taking any blood thinners recently.  She says she has not had a change in her diet.  She has actually gained weight recently which she attributes to fluid retention.  PAST MEDICAL HISTORY :  She  has a past medical history of Allergic rhinitis, Anxiety, Asthma, COPD (chronic obstructive pulmonary disease) ([place]), GERD (gastroesophageal reflux disease), and Mobitz type 2 second degree AV block (01/06/2015).  PAST SURGICAL HISTORY: She  has a past surgical history that includes Ectopic pregnancy surgery (1974); Tonsillectomy and adenoidectomy (1954); Cardiac catheterization (N/A, 01/07/2015); and Pacemaker insertion.  Allergies  Allergen Reactions  . Avelox [Moxifloxacin Hcl  In Nacl] Itching and Rash    No current facility-administered medications on file prior to encounter.    Current Outpatient Medications on File Prior to Encounter  Medication Sig  . albuterol (PROAIR HFA) 108 (90 Base) MCG/ACT inhaler Inhale 1-2 puffs into the lungs every 6 (six) hours as needed for wheezing or shortness of breath.  Marland Kitchen albuterol (PROVENTIL) (2.5 MG/3ML) 0.083% nebulizer solution Take 3 mLs (2.5 mg total) by nebulization every 6 (six) hours as needed for wheezing or shortness of breath.  . ALPRAZolam (XANAX) 0.5 MG tablet Take 1 tablet (0.5 mg total) by mouth every 8 (eight) hours as needed for anxiety (**DO NOT TAKE AT NIGHT WITH HYDROXYZINE**). Do not take with alcohol  . benzonatate (TESSALON) 200 MG capsule Take 1 capsule (200 mg total) by mouth 3 (three) times daily as needed for cough.  . budesonide-formoterol (SYMBICORT) 160-4.5 MCG/ACT inhaler Inhale 2 puffs into the lungs 2 (two) times daily.  . cephALEXin (KEFLEX) 500 MG capsule Take 1 capsule (500 mg total) by mouth 4 (four) times daily.  . hydrOXYzine (ATARAX/VISTARIL) 10 MG tablet TAKE 2 TABLETS BY MOUTH AS NEEDED FOR ITCHING. DO NOT TAKE WITH ALPRAZOLAM  . ipratropium (ATROVENT) 0.02 % nebulizer solution Take 2.5 mLs (0.5 mg total) by nebulization every 6 (six) hours as needed for wheezing or shortness of breath.  . triamcinolone ointment (KENALOG) 0.1 % APPLY TO AFFECTED AREA TWICE A DAY  . albuterol (PROVENTIL HFA;VENTOLIN HFA) 108 (90 Base) MCG/ACT inhaler Inhale 2 puffs into the lungs every 6 (six)  hours as needed for wheezing or shortness of breath. (Patient not taking: Reported on 02/18/2018)  . aspirin EC 81 MG EC tablet Take 1 tablet (81 mg total) by mouth daily.  . calcium-vitamin D (OSCAL WITH D) 250-125 MG-UNIT tablet Take 1 tablet by mouth daily.  . Cholecalciferol (VITAMIN D) 2000 UNITS CAPS Take 1 capsule by mouth daily.  . hydrochlorothiazide (HYDRODIURIL) 25 MG tablet TAKE 1/2 OR 1 DAILY AS NEEDED FOR  SWELLING OF LEGS (Patient not taking: Reported on 02/18/2018)  . ibuprofen (ADVIL,MOTRIN) 800 MG tablet Take 1 tablet (800 mg total) by mouth 3 (three) times daily.  . metoprolol tartrate (LOPRESSOR) 25 MG tablet Take 1 tablet (25 mg total) by mouth 2 (two) times daily.  . Multiple Vitamin (MULTIVITAMIN) tablet Take 2 tablets by mouth every morning.   . predniSONE (DELTASONE) 20 MG tablet 2 tabs po daily x 4 days (Patient not taking: Reported on 02/18/2018)    FAMILY HISTORY:  Her indicated that her mother is deceased. She indicated that her father is deceased. She indicated that her sister is alive. She indicated that her brother is deceased. She indicated that her maternal grandmother is deceased. She indicated that her maternal grandfather is deceased. She indicated that her paternal grandmother is deceased. She indicated that her paternal grandfather is deceased. She indicated that the status of her neg hx is unknown.   SOCIAL HISTORY: She  reports that she quit smoking about 5 years ago. Her smoking use included cigarettes. She has a 35.00 pack-year smoking history. She has never used smokeless tobacco. She reports that she drinks about 16.8 oz of alcohol per week. She reports that she does not use drugs.  REVIEW OF SYSTEMS:   Gen per HPI HEENT: Denies blurred vision, double vision, hearing loss, tinnitus, sinus congestion, rhinorrhea, sore throat, neck stiffness, dysphagia PULM:per HPI CV: Denies chest pain, edema, orthopnea, paroxysmal nocturnal dyspnea, palpitations GI: Denies abdominal pain, nausea, vomiting, diarrhea, hematochezia, melena, constipation, change in bowel habits GU: Denies dysuria, hematuria, polyuria, oliguria, urethral discharge, + menses Endocrine: Denies hot or cold intolerance, polyuria, polyphagia or appetite change Derm: Denies rash, dry skin, scaling or peeling skin change Heme: Denies easy bruising, bleeding, bleeding gums Neuro: Denies headache, numbness,  weakness, slurred speech, loss of memory or consciousness   SUBJECTIVE:  As above  VITAL SIGNS: BP (!) 130/53   Pulse (!) 105   Temp 98.6 F (37 C) (Oral)   Resp 20   Ht 5\' 6"  (1.676 m)   Wt 143 lb (64.9 kg)   SpO2 92%   BMI 23.08 kg/m   HEMODYNAMICS:    VENTILATOR SETTINGS:    INTAKE / OUTPUT: I/O last 3 completed shifts: In: 668.8 [I.V.:368.8; IV Piggyback:300] Out: -   PHYSICAL EXAMINATION:  Gen: chronically ill appearing HENT: OP clear,  neck supple PULM: CTA B, normal percussion CV: RRR, no mgr, trace edema GI: BS+, soft, nontender Derm: no cyanosis or rash Psyche: anxious, but otherwise normal affect   LABS:  BMET Recent Labs  Lab 02/18/18 2114 02/19/18 0242  NA 142 135  K 3.6 3.3*  CL 99 95*  CO2 25 27  BUN 5* 5*  CREATININE 0.78 0.70  GLUCOSE 72 90    Electrolytes Recent Labs  Lab 02/18/18 2114 02/19/18 0242  CALCIUM 8.6* 7.6*    CBC Recent Labs  Lab 02/18/18 2114 02/19/18 0242 02/19/18 1106  WBC 8.0 7.4 5.1  HGB 11.6* 9.8* 9.6*  HCT 32.8* 27.8* 27.0*  PLT  [place]  Lab 02/18/18 2114  APTT 29  INR 0.97    Sepsis Markers No results for input(s): LATICACIDVEN, PROCALCITON, O2SATVEN in the last 168 hours.  ABG No results for input(s): PHART, PCO2ART, PO2ART in the last 168 hours.  Liver Enzymes Recent Labs  Lab 02/18/18 2114 02/19/18 0242  AST 282* 218*  ALT 78* 64*  ALKPHOS 256* 207*  BILITOT 1.5* 1.3*  ALBUMIN 2.6* 2.2*    Cardiac Enzymes No results for input(s): TROPONINI, PROBNP in the last 168 hours.  Glucose No results for input(s): GLUCAP in the last 168 hours.  Imaging Ct Angio Chest Pe W And/or Wo Contrast  Result Date: 02/18/2018 CLINICAL DATA:  Hemoptysis mild-to-moderate, hematemesis, not on blood thinners, history COPD, hypertension, has had vaginal bleeding for 4 days EXAM: CT ANGIOGRAPHY CHEST WITH CONTRAST TECHNIQUE: Multidetector CT imaging of the chest was  performed using the standard protocol during bolus administration of intravenous contrast. Multiplanar CT image reconstructions and MIPs were obtained to evaluate the vascular anatomy. CONTRAST:  91mL ISOVUE-370 IOPAMIDOL (ISOVUE-370) INJECTION 76% IV COMPARISON:  09/10/2015 FINDINGS: Cardiovascular: LEFT subclavian transvenous pacemaker leads at RIGHT atrium and RIGHT ventricle. Atherosclerotic calcifications aorta, coronary arteries, and proximal great vessels. Aorta normal caliber without aneurysm or dissection. A small lymph node is again identified at the bifurcation of the lingular pulmonary artery. No pulmonary emboli are identified. No pericardial effusion. Mediastinum/Nodes: Esophagus normal appearance. Base of cervical region normal appearance. No thoracic adenopathy. 8 mm short axis precarinal lymph node noted. Lungs/Pleura: Emphysematous changes. Significant RIGHT lower lobe opacity favor representing pneumonia. Central peribronchial thickening. Remaining lungs clear. No pleural effusion or pneumothorax. Upper Abdomen: Marked fatty infiltration of liver. Remaining visualized upper abdomen unremarkable old healed Musculoskeletal: Diffuse osseous demineralization. Review of the MIP images confirms the above findings. IMPRESSION: No evidence of pulmonary embolism. Atherosclerotic calcifications including coronary arteries. Emphysematous and bronchitic changes consistent with COPD. Opacification of the central portions of the RIGHT lower lobe favoring pneumonia, though pulmonary hemorrhage could have a similar appearance. Follow-up evaluation until resolution recommended to ensure resolution and exclude underlying abnormalities including tumor. Marked fatty infiltration of liver. Aortic Atherosclerosis (ICD10-I70.0) and Emphysema (ICD10-J43.9). Electronically Signed   By: Lavonia Dana M.D.   On: 02/18/2018 23:07   US Abdomen Limited Ruq  Result Date: 02/19/2018 CLINICAL DATA:  Initial evaluation for  elevated LFTs. EXAM: ULTRASOUND ABDOMEN LIMITED RIGHT UPPER QUADRANT COMPARISON:  Prior ultrasound from 12/02/2016 FINDINGS: Gallbladder: No gallstones or wall thickening visualized. No sonographic Murphy sign noted by sonographer. Common bile duct: Diameter: 2.6 mm Liver: No focal lesion identified. Diffusely increased hepatic echogenicity, suggestive of steatosis. Portal vein is patent on color Doppler imaging with normal direction of blood flow towards the liver. IMPRESSION: 1. No cholelithiasis, evidence for acute cholecystitis, or biliary dilatation. 2. Increased hepatic echogenicity, suggesting steatosis. Electronically Signed   By: Jeannine Boga M.D.   On: 02/19/2018 00:24     STUDIES:  February 18, 2017 CT chest images independently reviewed showing consolidation in the right lower lobe, no clear airway lesion, some upper lobe predominant emphysema noted  CULTURES: 7/8 blood >  7/7 resp >   ANTIBIOTICS: 7/7 unasyn >  7/7 azithro >   SIGNIFICANT EVENTS:   LINES/TUBES:   DISCUSSION: 72 year old female with a past medical history significant for tobacco abuse, COPD, and alcohol abuse presents with concomitant menses and hemoptysis.  It is not clear to me why these 2 events are happening  at the same time as there does not appear to be an underlying coagulopathy or other cause of systemic bleeding.  Further, though the CT scan is abnormal there is no clinical symptoms of pneumonia to explain the hemoptysis.  Given her high risk of lung malignancy I think she needs to have an airway exam to make sure there is no evidence of an endobronchial lesion.  ASSESSMENT / PLAN:  PULMONARY A: Hemoptysis Tobacco abuse COPD Possible pneumonia P:   Okay to continue antibiotics for now, however if no evidence of fever would minimize antibiotic course to no more than 5 days Plan for bronchoscopy on February 21, 2018 as this is the soonest that the respiratory staff is capable of performing a  bronchoscopy at [place] [facility] after midnight on February 20, 2018  Roselie Awkward, MD [place] [facility] Pager: [phone] Cell: [phone] After 3pm or if no response, call [phone]   02/19/2018, 2:30 PM

## 2018-02-20 ENCOUNTER — Encounter: Payer: Self-pay | Admitting: *Deleted

## 2018-02-20 DIAGNOSIS — F322 Major depressive disorder, single episode, severe without psychotic features: Secondary | ICD-10-CM

## 2018-02-20 DIAGNOSIS — F1010 Alcohol abuse, uncomplicated: Secondary | ICD-10-CM

## 2018-02-20 LAB — CBC WITH DIFFERENTIAL/PLATELET
BASOS PCT: 0 %
Basophils Absolute: 0 10*3/uL (ref 0.0–0.1)
EOS ABS: 0 10*3/uL (ref 0.0–0.7)
EOS PCT: 0 %
HCT: 26.8 % — ABNORMAL LOW (ref 36.0–46.0)
HEMOGLOBIN: 9.3 g/dL — AB (ref 12.0–15.0)
LYMPHS ABS: 0.6 10*3/uL — AB (ref 0.7–4.0)
Lymphocytes Relative: 5 %
MCH: 36.9 pg — ABNORMAL HIGH (ref 26.0–34.0)
MCHC: 34.7 g/dL (ref 30.0–36.0)
MCV: 106.3 fL — ABNORMAL HIGH (ref 78.0–100.0)
Monocytes Absolute: 0.9 10*3/uL (ref 0.1–1.0)
Monocytes Relative: 8 %
NEUTROS PCT: 87 %
Neutro Abs: 10 10*3/uL — ABNORMAL HIGH (ref 1.7–7.7)
PLATELETS: 320 10*3/uL (ref 150–400)
RBC: 2.52 MIL/uL — AB (ref 3.87–5.11)
RDW: 13.7 % (ref 11.5–15.5)
WBC: 11.5 10*3/uL — AB (ref 4.0–10.5)

## 2018-02-20 LAB — COMPREHENSIVE METABOLIC PANEL
ALK PHOS: 203 U/L — AB (ref 38–126)
ALT: 61 U/L — AB (ref 0–44)
AST: 176 U/L — AB (ref 15–41)
Albumin: 2.3 g/dL — ABNORMAL LOW (ref 3.5–5.0)
Anion gap: 10 (ref 5–15)
BUN: 8 mg/dL (ref 8–23)
CALCIUM: 7.4 mg/dL — AB (ref 8.9–10.3)
CHLORIDE: 102 mmol/L (ref 98–111)
CO2: 27 mmol/L (ref 22–32)
CREATININE: 0.82 mg/dL (ref 0.44–1.00)
GFR calc Af Amer: >60 mL/min (ref >60)
GFR calc non Af Amer: >60 mL/min (ref >60)
Glucose, Bld: 167 mg/dL — ABNORMAL HIGH (ref 70–99)
Potassium: 3.8 mmol/L (ref 3.5–5.1)
SODIUM: 139 mmol/L (ref 135–145)
Total Bilirubin: 1.1 mg/dL (ref 0.3–1.2)
Total Protein: 5.6 g/dL — ABNORMAL LOW (ref 6.5–8.1)

## 2018-02-20 LAB — EXPECTORATED SPUTUM ASSESSMENT W REFEX TO RESP CULTURE

## 2018-02-20 LAB — EXPECTORATED SPUTUM ASSESSMENT W GRAM STAIN, RFLX TO RESP C

## 2018-02-20 MED ORDER — BENZONATATE 100 MG PO CAPS
100.0000 mg | ORAL_CAPSULE | Freq: Three times a day (TID) | ORAL | Status: DC
  Administered 2018-02-20 – 2018-02-22 (×7): 100 mg via ORAL
  Filled 2018-02-20 (×7): qty 1

## 2018-02-20 MED ORDER — DM-GUAIFENESIN ER 30-600 MG PO TB12
1.0000 | ORAL_TABLET | Freq: Two times a day (BID) | ORAL | Status: DC
  Administered 2018-02-20 – 2018-02-22 (×5): 1 via ORAL
  Filled 2018-02-20 (×5): qty 1

## 2018-02-20 MED ORDER — HYDROXYZINE HCL 25 MG PO TABS: 25.0000 mg | ORAL_TABLET | Freq: Three times a day (TID) | ORAL | Status: DC | PRN

## 2018-02-20 MED ORDER — GUAIFENESIN-DM 100-10 MG/5ML PO SYRP
5.0000 mL | ORAL_SOLUTION | ORAL | Status: DC | PRN
  Administered 2018-02-20: 5 mL via ORAL
  Filled 2018-02-20: qty 10

## 2018-02-20 NOTE — Consult Note (Signed)
   [facility] CM Inpatient Consult   02/20/2018  Jenna Marquez 09-18-1945 [id number]   Patient screened for [place] Management program due to multiple co morbidities.   Went to bedside to speak with Jenna Marquez about [facility] Care Management program. She is agreeable and [facility] Care Management written consent obtained.   Jenna Marquez endorses she lives alone.  Confirms Primary Care MD is Dr.Copland.  States her great nephew or family members take her to MD appointments.  States she does not have issues with obtaining medications. However, she states " I take them how I want to".   Patient becomes very tearful and emotional during bedside encounter. States " I just want to go home and die. There is no purpose for me anymore. I have buried 5 people in the past 6 years and I am tired of living. I have been trying to starve myself but it hasn't been working". Attempted to provide emotional support. Offered to contact the Chaplain on her behalf. She declined Geographical information systems officer. Made Jenna Marquez aware that writer will make her nurse and MD aware of her state and how she is feeling right now.  Made MD and patient's nurse aware of bedside encounter and what patient was saying.   Will continue to follow and make appropriate [facility] Care Management referrals.   Will make inpatient RNCM aware as well.    Marthenia Rolling, MSN-Ed, RN,BSN [facility] Liaison [phone]

## 2018-02-20 NOTE — Consult Note (Addendum)
Jenna Marquez Face-to-Face Psychiatry Consult   Reason for Consult:  Depression with SI Referring Physician:  Dr. Posey Pronto  Patient Identification: Jenna Marquez MRN:  [id number] Principal Diagnosis: MDD (major depressive disorder), single episode, severe , no psychosis ([place]) Diagnosis:   Patient Active Problem List   Diagnosis Date Noted  . Hemoptysis [R04.2] 02/19/2018  . Community acquired pneumonia [J18.9] 02/19/2018  . Vagina bleeding [N93.9] 02/19/2018  . Alcohol abuse [F10.10] 02/19/2018  . Pneumonia [J18.9] 02/19/2018  . Left knee pain [M25.562] 07/11/2017  . PVD (peripheral vascular disease) ([place]) [I73.9] 04/04/2017  . HTN (hypertension) [I10] 11/24/2016  . Paroxysmal atrial fibrillation ([place]) [I48.0] 09/30/2016  . Acute respiratory failure with hypoxia ([place]) [J96.01] 09/30/2016  . Mobitz type 2 second degree AV block [I44.1] 09/30/2016  . Swelling of arm [M79.89] 05/22/2015  . Left shoulder pain [M25.512] 05/22/2015  . CKD (chronic kidney disease) stage 3, GFR 30-59 ml/min (HCC) [N18.3] 05/11/2015  . Pacemaker [Z95.0] 04/22/2015  . Generalized anxiety disorder [F41.1] 01/11/2012  . 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 [J44.9]   . Tobacco abuse [Z72.0]     Total Time spent with patient: 1 hour  Subjective:   Jenna Marquez is a 72 y.o. female patient admitted with hemoptysis and vaginal bleeding.  HPI:   Per chart review, patient was admitted with hemoptysis and vaginal bleeding. She will need a bronchoscopy as an outpatient. She is prescribed antibiotics for aspiration pneumonia. She is also receiving an Ativan protocol for alcohol withdrawal. Today she reported to the case manager, "I just want to go home and die. There is no purpose for me anymore. I have buried 5 people in the past 6 years and I am tired of living. I have been trying to starve myself but it hasn't been working."   On interview, Jenna Marquez reports admission to the hospital for vaginal  bleeding x 4 weeks and hemoptysis for several days. She reports an improvement in hemoptysis since hospitalization. She reports depressed mood since October due to loss of independence which led to increased feelings of isolation. She has not been able to drive due to leg pain secondary to leg swelling. She is unable to enjoy activities such as shopping. She also reports poor sleep. She sleeps 3 hours nightly and may take a daily nap for 1-2 hours. She reports poor appetite. She reports no changes in her weight. She reports a history of several losses including her mother in 30, then brother, sister, nephew and another brother a few years apart from each other. She reports chronic and passive SI without a plan. She reports onset of fleeting SI several years ago that is unchanged from her baseline. She reports that religion is protective against suicide. She denies HI or AVH.   Patient's son and daughter-in-law were at bedside with her permission. They have no concerns for her safety at home and will also monitor her for safety. They would like to see her involved in activities during the day to decrease her feelings of loneliness. She has difficulty ambulating and may benefit from working with PT.   Past Psychiatric History: Anxiety and alcohol abuse.   Risk to Self:  Yes although low risk given chronic and passive SI without a plan.  Risk to Others:  None. Denies HI. Prior Inpatient Therapy:  Denies  Prior Outpatient Therapy:  Prior medications include Xanax for anxiety/insomnia and antidepressants several years ago (she cannot recall the names).   Past Medical History:  Past Medical History:  Diagnosis Date  . Allergic rhinitis   . Anxiety    "occasionally"  . Asthma   . COPD (chronic obstructive pulmonary disease) ([place])   . GERD (gastroesophageal reflux disease)   . Mobitz type 2 second degree AV block 01/06/2015    Past Surgical History:  Procedure Laterality Date  . [place]  . EP IMPLANTABLE DEVICE N/A 01/07/2015   Procedure: Pacemaker Implant;  Surgeon: Evans Lance, MD;  Location: [facility];  Service: Cardiovascular;  Laterality: N/A;  . PACEMAKER INSERTION    . TONSILLECTOMY AND ADENOIDECTOMY  1954   Family History:  Family History  Problem Relation Age of Onset  . Coronary artery disease Mother   . Heart disease Mother        CHF  . Diabetes Neg Hx   . Cancer Neg Hx   . Stroke Neg Hx   . Colon cancer Neg Hx    Family Psychiatric  History: Denies  Social History:  Social History   Substance and Sexual Activity  Alcohol Use Yes  . Alcohol/week: 16.8 oz  . Types: 28 Shots of liquor per week   Comment: 01/06/2015 "2 shot mixed drinks, 2/day"     Social History   Substance and Sexual Activity  Drug Use No    Social History   Socioeconomic History  . Marital status: Widowed    Spouse name: Not on file  . Number of children: 1  . Years of education: Not on file  . Highest education level: Not on file  Occupational History  . Occupation: Retired    Fish farm manager: [employer]    Comment: Production manager  Social Needs  . Financial resource strain: Not on file  . Food insecurity:    Worry: Not on file    Inability: Not on file  . Transportation needs:    Medical: Not on file    Non-medical: Not on file  Tobacco Use  . Smoking status: Former Smoker    Packs/day: 1.00    Years: 35.00    Pack years: 35.00    Types: Cigarettes    Last attempt to quit: 08/10/2012    Years since quitting: 5.5  . Smokeless tobacco: Never Used  . Tobacco comment: Started at age 58  Substance and Sexual Activity  . Alcohol use: Yes    Alcohol/week: 16.8 oz    Types: 28 Shots of liquor per week    Comment: 01/06/2015 "2 shot mixed drinks, 2/day"  . Drug use: No  . Sexual activity: Never    Birth control/protection: Post-menopausal  Lifestyle  . Physical activity:    Days per week: Not on file    Minutes per session: Not on file   . Stress: Not on file  Relationships  . Social connections:    Talks on phone: Not on file    Gets together: Not on file    Attends religious service: Not on file    Active member of club or organization: Not on file    Attends meetings of clubs or organizations: Not on file    Relationship status: Not on file  Other Topics Concern  . Not on file  Social History Narrative   Lost husband in 12-2011, she lost her sister in 2012, brother in July 2013. She lives by herself, God son stayed with her. She retired as Radio producer.   Additional Social History: She lives at home with her nephew. She reports a history  of alcohol use since college. She drinks 8 ounces of liquor daily. She denies a history of DTs or withdrawals. She denies a history of blackouts or legal charges related to alcohol use. She denies illicit substance use.     Allergies:   Allergies  Allergen Reactions  . Avelox [Moxifloxacin Hcl In Nacl] Itching and Rash    Labs:  Results for orders placed or performed during the hospital encounter of 02/18/18 (from the past 48 hour(s))  Comprehensive metabolic panel     Status: Abnormal   Collection Time: 02/18/18  9:14 PM  Result Value Ref Range   Sodium 142 135 - 145 mmol/L   Potassium 3.6 3.5 - 5.1 mmol/L   Chloride 99 98 - 111 mmol/L    Comment: Please note change in reference range.   CO2 25 22 - 32 mmol/L   Glucose, Bld 72 70 - 99 mg/dL    Comment: Please note change in reference range.   BUN 5 (L) 8 - 23 mg/dL    Comment: Please note change in reference range.   Creatinine, Ser 0.78 0.44 - 1.00 mg/dL   Calcium 8.6 (L) 8.9 - 10.3 mg/dL   Total Protein 6.2 (L) 6.5 - 8.1 g/dL   Albumin 2.6 (L) 3.5 - 5.0 g/dL   AST 282 (H) 15 - 41 U/L   ALT 78 (H) 0 - 44 U/L    Comment: Please note change in reference range.   Alkaline Phosphatase 256 (H) 38 - 126 U/L   Total Bilirubin 1.5 (H) 0.3 - 1.2 mg/dL   GFR calc non Af Amer >60 >60 mL/min   GFR calc Af Amer >60 >60  mL/min    Comment: (NOTE) The eGFR has been calculated using the CKD EPI equation. This calculation has not been validated in all clinical situations. eGFR's persistently <60 mL/min signify possible Chronic Kidney Disease.    Anion gap 18 (H) 5 - 15    Comment: Performed at [facility], [place] [address] [place], [place] [address]  CBC     Status: Abnormal   Collection Time: 02/18/18  9:14 PM  Result Value Ref Range   WBC 8.0 4.0 - 10.5 K/uL   RBC 3.10 (L) 3.87 - 5.11 MIL/uL   Hemoglobin 11.6 (L) 12.0 - 15.0 g/dL   HCT 32.8 (L) 36.0 - 46.0 %   MCV 105.8 (H) 78.0 - 100.0 fL   MCH 37.4 (H) 26.0 - 34.0 pg   MCHC 35.4 30.0 - 36.0 g/dL   RDW 13.7 11.5 - 15.5 %   Platelets 327 150 - 400 K/uL    Comment: Performed at [facility], [place] [address], [place], [place] [address]  Type and screen [place]     Status: None   Collection Time: 02/18/18  9:14 PM  Result Value Ref Range   ABO/RH(D) O NEG    Antibody Screen NEG    Sample Expiration      02/21/2018 Performed at [facility], [place] [address] [place], [place] [address]   Brain natriuretic peptide     Status: Abnormal   Collection Time: 02/18/18  9:14 PM  Result Value Ref Range   B Natriuretic Peptide 136.4 (H) 0.0 - 100.0 pg/mL    Comment: Performed at [facility], [place] [address] [place], [place] [address]  Wet prep, genital     Status: Abnormal   Collection Time: 02/18/18  9:14 PM  Result Value  Ref Range   Yeast Wet Prep HPF POC NONE SEEN NONE SEEN   Trich, Wet Prep NONE SEEN NONE SEEN   Clue Cells Wet Prep HPF POC NONE SEEN NONE SEEN   WBC, Wet Prep HPF POC MODERATE (A) NONE SEEN   Sperm NONE SEEN     Comment: Performed at [facility], [place] [address] [place], [place] [address]  Protime-INR     Status: None   Collection Time: 02/18/18  9:14 PM  Result Value Ref Range   Prothrombin Time 12.8 11.4 - 15.2  seconds   INR 0.97     Comment: Performed at [facility], [place] [address] [place], [place] [address]  APTT     Status: None   Collection Time: 02/18/18  9:14 PM  Result Value Ref Range   aPTT 29 24 - 36 seconds    Comment: Performed at [facility], [place] [address], [place], [place] [address]  ABO/Rh     Status: None   Collection Time: 02/18/18  9:14 PM  Result Value Ref Range   ABO/RH(D)      Jenetta Downer NEG Performed at [place] [address] [place], [place] [address]   Ethanol     Status: Abnormal   Collection Time: 02/18/18  9:30 PM  Result Value Ref Range   Alcohol, Ethyl (B) 41 (H) <10 mg/dL    Comment: (NOTE) Lowest detectable limit for serum alcohol is 10 mg/dL. For medical purposes only. Performed at [facility], [place] [address] [place], [place] [address]   Acetaminophen level     Status: Abnormal   Collection Time: 02/18/18 10:48 PM  Result Value Ref Range   Acetaminophen (Tylenol), Serum <10 (L) 10 - 30 ug/mL    Comment: (NOTE) Therapeutic concentrations vary significantly. A range of 10-30 ug/mL  may be an effective concentration for many patients. However, some  are best treated at concentrations outside of this range. Acetaminophen concentrations >150 ug/mL at 4 hours after ingestion  and >50 ug/mL at 12 hours after ingestion are often associated with  toxic reactions. Performed at [facility], [place] [address], [place], [place] [address]   Salicylate level     Status: None   Collection Time: 02/18/18 10:48 PM  Result Value Ref Range   Salicylate Lvl <4.9 2.8 - 30.0 mg/dL    Comment: Performed at [facility], [place] [address], [place],  [address]  Urinalysis, Routine w reflex microscopic     Status: Abnormal   Collection Time: 02/18/18 11:57 PM  Result Value Ref Range   Color, Urine AMBER (A) YELLOW    Comment: BIOCHEMICALS MAY BE AFFECTED  BY COLOR   APPearance CLOUDY (A) CLEAR   Specific Gravity, Urine 1.005 1.005 - 1.030   pH 6.0 5.0 - 8.0   Glucose, UA NEGATIVE NEGATIVE mg/dL   Hgb urine dipstick LARGE (A) NEGATIVE   Bilirubin Urine NEGATIVE NEGATIVE   Ketones, ur NEGATIVE NEGATIVE mg/dL   Protein, ur NEGATIVE NEGATIVE mg/dL   Nitrite POSITIVE (A) NEGATIVE   Leukocytes, UA LARGE (A) NEGATIVE   RBC / HPF >50 (H) 0 - 5 RBC/hpf   WBC, UA >50 (H) 0 - 5 WBC/hpf   Bacteria, UA MANY (A) NONE SEEN   Squamous Epithelial / LPF 0-5 0 - 5   WBC Clumps PRESENT     Comment: Performed at [facility], [place] [address] [place], [place]  [address]  Urine culture     Status: Abnormal (Preliminary result)   Collection Time: 02/18/18 11:57 PM  Result Value Ref Range   Specimen Description      URINE, CLEAN CATCH Performed at [facility], [place] [address] [place], [place] [address]    Special Requests      NONE Performed at [facility], [place] [address] [place], [place] [address]    Culture (A)     >=100,000 COLONIES/mL ESCHERICHIA COLI SUSCEPTIBILITIES TO FOLLOW Performed at [place] [address], [place], [place] [address]    Report Status PENDING   Strep pneumoniae urinary antigen     Status: None   Collection Time: 02/19/18  1:30 AM  Result Value Ref Range   Strep Pneumo Urinary Antigen NEGATIVE NEGATIVE    Comment: Performed at [place] [address], [place], [place] [address]  Culture, blood (routine x 2) Call MD if unable to obtain prior to antibiotics being given     Status: None (Preliminary result)   Collection Time: 02/19/18  2:42 AM  Result Value Ref Range   Specimen Description      BLOOD LEFT WRIST Performed at [place] [address] [place], [place] [address]    Special Requests      BOTTLES DRAWN AEROBIC ONLY Blood Culture results may not be optimal due to an inadequate volume of blood received in culture  bottles Performed at [place] [address], [place], [place] [address]    Culture PENDING    Report Status PENDING   HIV antibody (Routine Screening)     Status: None   Collection Time: 02/19/18  2:42 AM  Result Value Ref Range   HIV Screen 4th Generation wRfx Non Reactive Non Reactive    Comment: (NOTE) Performed At: [facility] [place], [place] [id number] Rush Farmer MD OQ:[id number] Performed at [facility] [place] [address], [place], [place] [address]   Comprehensive metabolic panel     Status: Abnormal   Collection Time: 02/19/18  2:42 AM  Result Value Ref Range   Sodium 135 135 - 145 mmol/L    Comment: DELTA CHECK NOTED   Potassium 3.3 (L) 3.5 - 5.1 mmol/L   Chloride 95 (L) 98 - 111 mmol/L    Comment: Please note change in reference range.   CO2 27 22 - 32 mmol/L   Glucose, Bld 90 70 - 99 mg/dL    Comment: Please note change in reference range.   BUN 5 (L) 8 - 23 mg/dL    Comment: Please note change in reference range.   Creatinine, Ser 0.70 0.44 - 1.00 mg/dL   Calcium 7.6 (L) 8.9 - 10.3 mg/dL   Total Protein 5.4 (L) 6.5 - 8.1 g/dL   Albumin 2.2 (L) 3.5 - 5.0 g/dL   AST 218 (H) 15 - 41 U/L   ALT 64 (H) 0 - 44 U/L    Comment: Please note change in reference range.   Alkaline Phosphatase 207 (H) 38 - 126 U/L   Total Bilirubin 1.3 (H) 0.3 - 1.2 mg/dL   GFR calc non Af Amer >60 >60 mL/min   GFR calc Af Amer >60 >60 mL/min    Comment: (NOTE) The eGFR has been calculated using the CKD EPI equation. This calculation has not been validated in all clinical situations. eGFR's persistently <60 mL/min signify possible Chronic Kidney Disease.  Anion gap 13 5 - 15    Comment: Performed at [facility], [place] [address] [place], [place] [address]  CBC WITH DIFFERENTIAL     Status: Abnormal   Collection Time: 02/19/18  2:42 AM  Result Value Ref Range   WBC 7.4 4.0 - 10.5 K/uL   RBC  2.67 (L) 3.87 - 5.11 MIL/uL   Hemoglobin 9.8 (L) 12.0 - 15.0 g/dL   HCT 27.8 (L) 36.0 - 46.0 %   MCV 104.1 (H) 78.0 - 100.0 fL   MCH 36.7 (H) 26.0 - 34.0 pg   MCHC 35.3 30.0 - 36.0 g/dL   RDW 13.7 11.5 - 15.5 %   Platelets 266 150 - 400 K/uL   Neutrophils Relative % 86 %   Neutro Abs 6.3 1.7 - 7.7 K/uL   Lymphocytes Relative 9 %   Lymphs Abs 0.6 (L) 0.7 - 4.0 K/uL   Monocytes Relative 5 %   Monocytes Absolute 0.3 0.1 - 1.0 K/uL   Eosinophils Relative 0 %   Eosinophils Absolute 0.0 0.0 - 0.7 K/uL   Basophils Relative 0 %   Basophils Absolute 0.0 0.0 - 0.1 K/uL    Comment: Performed at [facility], [place] [address] [place], [place] [address]  Culture, sputum-assessment     Status: None   Collection Time: 02/19/18  4:29 AM  Result Value Ref Range   Specimen Description SPUTUM    Special Requests NONE    Sputum evaluation      Sputum specimen not acceptable for testing.  Please recollect.   RESULT CALLED TO, READ BACK BY AND VERIFIED WITH: MICHELLE RN AT 3435 02/20/18 BY TIBBITTS,K Performed at [place] [address], [place], [place] [address]    Report Status 02/20/2018 FINAL   Reticulocytes     Status: Abnormal   Collection Time: 02/19/18 11:06 AM  Result Value Ref Range   Retic Ct Pct 1.8 0.4 - 3.1 %   RBC. 2.57 (L) 3.87 - 5.11 MIL/uL   Retic Count, Absolute 46.3 19.0 - 186.0 K/uL    Comment: Performed at [facility], [place] [address] [place], [place] [address]  CBC     Status: Abnormal   Collection Time: 02/19/18 11:06 AM  Result Value Ref Range   WBC 5.1 4.0 - 10.5 K/uL   RBC 2.57 (L) 3.87 - 5.11 MIL/uL   Hemoglobin 9.6 (L) 12.0 - 15.0 g/dL   HCT 27.0 (L) 36.0 - 46.0 %   MCV 105.1 (H) 78.0 - 100.0 fL   MCH 37.4 (H) 26.0 - 34.0 pg   MCHC 35.6 30.0 - 36.0 g/dL   RDW 13.7 11.5 - 15.5 %   Platelets 284 150 - 400 K/uL    Comment: Performed at [facility], [place] [address], [place],  [place] [address]  CBC with Differential/Platelet     Status: Abnormal   Collection Time: 02/20/18  8:04 AM  Result Value Ref Range   WBC 11.5 (H) 4.0 - 10.5 K/uL   RBC 2.52 (L) 3.87 - 5.11 MIL/uL   Hemoglobin 9.3 (L) 12.0 - 15.0 g/dL   HCT 26.8 (L) 36.0 - 46.0 %   MCV 106.3 (H) 78.0 - 100.0 fL   MCH 36.9 (H) 26.0 - 34.0 pg   MCHC 34.7 30.0 - 36.0 g/dL   RDW 13.7 11.5 - 15.5 %   Platelets 320 150 - 400 K/uL   Neutrophils Relative % 87 %  Neutro Abs 10.0 (H) 1.7 - 7.7 K/uL   Lymphocytes Relative 5 %   Lymphs Abs 0.6 (L) 0.7 - 4.0 K/uL   Monocytes Relative 8 %   Monocytes Absolute 0.9 0.1 - 1.0 K/uL   Eosinophils Relative 0 %   Eosinophils Absolute 0.0 0.0 - 0.7 K/uL   Basophils Relative 0 %   Basophils Absolute 0.0 0.0 - 0.1 K/uL    Comment: Performed at [facility], [place] [employer] [address] [place], [place] [address]  Comprehensive metabolic panel     Status: Abnormal   Collection Time: 02/20/18  8:04 AM  Result Value Ref Range   Sodium 139 135 - 145 mmol/L   Potassium 3.8 3.5 - 5.1 mmol/L   Chloride 102 98 - 111 mmol/L    Comment: Please note change in reference range.   CO2 27 22 - 32 mmol/L   Glucose, Bld 167 (H) 70 - 99 mg/dL    Comment: Please note change in reference range.   BUN 8 8 - 23 mg/dL    Comment: Please note change in reference range.   Creatinine, Ser 0.82 0.44 - 1.00 mg/dL   Calcium 7.4 (L) 8.9 - 10.3 mg/dL   Total Protein 5.6 (L) 6.5 - 8.1 g/dL   Albumin 2.3 (L) 3.5 - 5.0 g/dL   AST 176 (H) 15 - 41 U/L   ALT 61 (H) 0 - 44 U/L    Comment: Please note change in reference range.   Alkaline Phosphatase 203 (H) 38 - 126 U/L   Total Bilirubin 1.1 0.3 - 1.2 mg/dL   GFR calc non Af Amer >60 >60 mL/min   GFR calc Af Amer >60 >60 mL/min    Comment: (NOTE) The eGFR has been calculated using the CKD EPI equation. This calculation has not been validated in all clinical situations. eGFR's persistently <60 mL/min signify possible Chronic Kidney Disease.     Anion gap 10 5 - 15    Comment: Performed at [facility], [place] [address] [place], [place] [address]    Current Facility-Administered Medications  Medication Dose Route Frequency Provider Last Rate Last Dose  . 0.9 %  sodium chloride infusion   Intravenous Continuous Elwyn Reach, MD 100 mL/hr at 02/20/18 0626    . ALPRAZolam (XANAX) tablet 0.5 mg  0.5 mg Oral Q8H PRN Gala Romney L, MD   0.5 mg at 02/20/18 1013  . ampicillin-sulbactam (UNASYN) 1.5 g in sodium chloride 0.9 % 100 mL IVPB  1.5 g Intravenous Q6H Lavina Hamman, MD 200 mL/hr at 02/20/18 1148 1.5 g at 02/20/18 1148  . azithromycin (ZITHROMAX) 500 mg in sodium chloride 0.9 % 250 mL IVPB  500 mg Intravenous Q24H Elwyn Reach, MD   Stopped at 02/19/18 2315  . benzonatate (TESSALON) capsule 100 mg  100 mg Oral TID Lavina Hamman, MD   100 mg at 02/20/18 1129  . calcium-vitamin D (OSCAL WITH D) 500-200 MG-UNIT per tablet 1 tablet  1 tablet Oral Daily Elwyn Reach, MD   1 tablet at 02/20/18 1013  . cholecalciferol (VITAMIN D) tablet 2,000 Units  2,000 Units Oral Daily Elwyn Reach, MD   2,000 Units at 02/20/18 1013  . dextromethorphan-guaiFENesin (MUCINEX DM) 30-600 MG per 12 hr tablet 1 tablet  1 tablet Oral BID Lavina Hamman, MD   1 tablet at 02/20/18 1148  . fluticasone furoate-vilanterol (BREO ELLIPTA) 200-25 MCG/INH 1 puff  1 puff Inhalation Daily Gala Romney  L, MD   1 puff at 02/20/18 0806  . hydrOXYzine (ATARAX/VISTARIL) tablet 25 mg  25 mg Oral TID PRN Berton Mount, RPH      . ipratropium-albuterol (DUONEB) 0.5-2.5 (3) MG/3ML nebulizer solution 3 mL  3 mL Nebulization TID Elwyn Reach, MD   3 mL at 02/20/18 0806  . ipratropium-albuterol (DUONEB) 0.5-2.5 (3) MG/3ML nebulizer solution 3 mL  3 mL Nebulization Q4H PRN Jamse Arn, MD   3 mL at 02/20/18 0416  . LORazepam (ATIVAN) injection 0-4 mg  0-4 mg Intravenous Q6H Elwyn Reach, MD   1 mg at 02/20/18 1129   Or   . LORazepam (ATIVAN) tablet 0-4 mg  0-4 mg Oral Q6H Elwyn Reach, MD      . Derrill Memo ON 02/21/2018] LORazepam (ATIVAN) injection 0-4 mg  0-4 mg Intravenous Q12H Elwyn Reach, MD       Or  . Derrill Memo ON 02/21/2018] LORazepam (ATIVAN) tablet 0-4 mg  0-4 mg Oral Q12H Garba, Mohammad L, MD      . methylPREDNISolone sodium succinate (SOLU-MEDROL) 125 mg/2 mL injection 60 mg  60 mg Intravenous Q6H Gala Romney L, MD   60 mg at 02/20/18 1128  . metoprolol tartrate (LOPRESSOR) tablet 25 mg  25 mg Oral BID Gala Romney L, MD   25 mg at 02/20/18 1013  . multivitamin with minerals tablet 2 tablet  2 tablet Oral q morning - 10a Elwyn Reach, MD   2 tablet at 02/20/18 1013  . nicotine (NICODERM CQ - dosed in mg/24 hours) patch 21 mg  21 mg Transdermal Daily Garba, Mohammad L, MD      . thiamine (VITAMIN B-1) tablet 100 mg  100 mg Oral Daily Gala Romney L, MD   100 mg at 02/20/18 1013   Or  . thiamine (B-1) injection 100 mg  100 mg Intravenous Daily Garba, Mohammad L, MD      . triamcinolone ointment (KENALOG) 0.1 %   Topical BID Elwyn Reach, MD        Musculoskeletal: Strength & Muscle Tone: within normal limits Gait & Station: UTA since patient was lying in bed. Patient leans: N/A  Psychiatric Specialty Exam: Physical Exam  Nursing note and vitals reviewed. Constitutional: She is oriented to person, place, and time. She appears well-developed and well-nourished.  HENT:  Head: Normocephalic and atraumatic.  Neck: Normal range of motion.  Respiratory: Effort normal.  Musculoskeletal: Normal range of motion.  Neurological: She is alert and oriented to person, place, and time.  Skin: No rash noted.  Psychiatric: Her behavior is normal. Judgment and thought content normal. Cognition and memory are normal. She exhibits a depressed mood.    Review of Systems  Constitutional: Negative for chills and fever.  Cardiovascular: Negative for chest pain.  Gastrointestinal: Negative  for abdominal pain, constipation, diarrhea, nausea and vomiting.  Psychiatric/Behavioral: Positive for depression, substance abuse and suicidal ideas (chronic and passive). Negative for hallucinations. The patient has insomnia.   All other systems reviewed and are negative.   Blood pressure 138/67, pulse 85, temperature 97.6 F (36.4 C), resp. rate 16, height '5\' 6"'  (1.676 m), weight 64.9 kg (143 lb), SpO2 95 %.Body mass index is 23.08 kg/m.  General Appearance: Fairly Groomed, African American female, wearing a hospital gown with short hair and sitting up in bed. NAD.   Eye Contact:  Good  Speech:  Clear and Coherent and Normal Rate  Volume:  Normal  Mood:  Depressed  Affect:  Congruent  Thought Process:  Goal Directed, Linear and Descriptions of Associations: Intact  Orientation:  Full (Time, Place, and Person)  Thought Content:  Logical  Suicidal Thoughts:  Yes.  without intent/plan  Homicidal Thoughts:  No  Memory:  Immediate;   Good Recent;   Good Remote;   Good  Judgement:  Fair  Insight:  Fair  Psychomotor Activity:  Normal  Concentration:  Concentration: Good and Attention Span: Good  Recall:  Good  Fund of Knowledge:  Good  Language:  Good  Akathisia:  No  Handed:  Right  AIMS (if indicated):   N/A  Assets:  Communication Skills Housing Social Support  ADL's:  Intact  Cognition:  WNL  Sleep:   Poor   Assessment:  Jenna Marquez is a 72 y.o. female who was admitted with hemoptysis and vaginal bleeding. She endorsed SI to primary team. She reports depressed mood for for several months with positive neurovegetative symptoms in the setting of loss of independence and isolation. She reports chronic and passive SI without any intention to harm self. She is future oriented and reports religion as a protective factor. She declines medication management for mood but is agreeable to accept resources for therapy and day programs from SW. She denies HI or AVH. She does not warrant  inpatient psychiatric hospitalization at this time. Her son and daughter-in-law agree to monitor her for safety.   Treatment Plan Summary: -Patient declines medication management for depression. -Please have SW provide patient with resources for local therapists and day programs for senior citizens.  -Patient's son and daughter-in-law agree to monitor her for safety. Discussed locking up guns in home with patient and family to increase her safety. She is able to safety plan. -Psychiatry will sign off on patient at this time. Please consult psychiatry again as needed.   Disposition: No evidence of imminent risk to self or others at present.    Faythe Dingwall, DO 02/20/2018 12:18 PM

## 2018-02-20 NOTE — Progress Notes (Signed)
[facility] Progress Note  Patient: Jenna Marquez NAT:[id number]   PCP: Darreld Mclean, MD DOB: Jan 04, 1946   DOA: 02/18/2018   DOS: 02/20/2018   Date of Service: the patient was seen and examined on 02/20/2018  Subjective: No bleeding no hemoptysis.  No nausea no vomiting.  Reported that she wants to go home and die.  Constantly crying.  Complains about not getting enough dose of nebulizer.  Brief hospital course: Pt. with PMH of alcohol abuse, COPD, A. fib S/P PPM implant, not on anticoagulation, CKD 3, HTN, GERD; admitted on 02/18/2018, presented with complaint of cough with blood, was found to have COPD exacerbation secondary to pneumonia, severe depression, vaginal bleeding, anemia. Currently further plan is continue current management.  Assessment and Plan: 1.  Right lower lobe pneumonia. Likely aspiration. Reported hemoptysis. COPD exacerbation with bronchospasm. Pulmonary was consulted. Initial plan was to consider inpatient bronchoscopy but patient remained stable without any significant hemoptysis and therefore pulmonary recommends outpatient follow-up. Currently signed off. We will continue with IV Unasyn and azithromycin, follow-up on blood cultures.  Follow-up on other cultures. Urine is growing E. coli follow-up on that culture sensitivity. Continue IV Solu-Medrol for now. Continue duo nebs as well. Tessalon Perles on scheduled Mucinex.  2.  Hemoptysis Vaginal bleeding Acute blood loss anemia. H&H initially dropped but now relatively stable without any active bleeding. Ultrasound pelvis shows multiple fibroids, patient will need outpatient OB/GYN follow-up. Daily CBC for now. No evidence of active hemoptysis.  3.Suicidal ideation. Psychiatric consulted. Bedside sitter. At present psychiatry feels patient does not require inpatient admission. Patient is reportedly noncompliant with medical management as well as would not take any medication for depression.  4.   Proximal A. fib. S/P PPM. Currently paced. Not a candidate for anticoagulations, not on any anticoagulation prior to admission as well.  5.  Alcohol abuse. On CIWA protocol. Monitor.  6.  Chronic kidney disease stage III. Renal function stable.  Monitor.  Diet: cardiac diet DVT Prophylaxis: mechanical compression device  Advance goals of care discussion: full code  Family Communication: no family was present at bedside, at the time of interview.   Disposition:  Discharge to home.  Consultants: PCCM  Procedures: none  Antibiotics: Anti-infectives (From admission, onward)   Start     Dose/Rate Route Frequency Ordered Stop   02/19/18 2200  cefTRIAXone (ROCEPHIN) 1 g in sodium chloride 0.9 % 100 mL IVPB  Status:  Discontinued     1 g 200 mL/hr over 30 Minutes Intravenous Every 24 hours 02/19/18 0129 02/19/18 0948   02/19/18 2200  azithromycin (ZITHROMAX) 500 mg in sodium chloride 0.9 % 250 mL IVPB     500 mg 250 mL/hr over 60 Minutes Intravenous Every 24 hours 02/19/18 0129 02/26/18 2159   02/19/18 1200  ampicillin-sulbactam (UNASYN) 1.5 g in sodium chloride 0.9 % 100 mL IVPB     1.5 g 200 mL/hr over 30 Minutes Intravenous Every 6 hours 02/19/18 0948     02/19/18 1000  cephALEXin (KEFLEX) capsule 500 mg  Status:  Discontinued     500 mg Oral 4 times daily 02/19/18 0129 02/19/18 0134   02/18/18 2315  cefTRIAXone (ROCEPHIN) 1 g in sodium chloride 0.9 % 100 mL IVPB     1 g 200 mL/hr over 30 Minutes Intravenous  Once 02/18/18 2311 02/19/18 0016   02/18/18 2315  azithromycin (ZITHROMAX) 500 mg in sodium chloride 0.9 % 250 mL IVPB     500 mg 250 mL/hr over 60 Minutes  Intravenous  Once 02/18/18 2311 02/19/18 0046       Objective: Physical Exam: Vitals:   02/19/18 2354 02/20/18 0646 02/20/18 0806 02/20/18 1214  BP: 138/89 (!) 118/54  138/67  Pulse: 89 97  85  Resp: 20 20  16   Temp: 98.4 F (36.9 C) 97.6 F (36.4 C)    TempSrc: Oral     SpO2: 94% 93% 96% 95%  Weight:       Height:        Intake/Output Summary (Last 24 hours) at 02/20/2018 1536 Last data filed at 02/20/2018 1000 Gross per 24 hour  Intake 2590 ml  Output 200 ml  Net 2390 ml   Filed Weights   02/18/18 1927  Weight: 64.9 kg (143 lb)   General: Alert, Awake and Oriented to Time, Place and Person. Appear in moderate distress, affect labile Eyes: PERRL, Conjunctiva normal ENT: Oral Mucosa clear moist. Neck: no JVD, no Abnormal Mass Or lumps Cardiovascular: S1 and S2 Present, no Murmur, Peripheral Pulses Present Respiratory: increasedrespiratory effort, Bilateral Air entry equal and Decreased, no use of accessory muscle, right basal Crackles, bilateral expiratory  wheezes Abdomen: Bowel Sound present, Soft and no tenderness, no hernia Skin: no redness, no Rash, no induration Extremities: no Pedal edema, no calf tenderness Neurologic: Grossly no focal neuro deficit. Bilaterally Equal motor strength  Data Reviewed: CBC: Recent Labs  Lab 02/18/18 2114 02/19/18 0242 02/19/18 1106 02/20/18 0804  WBC 8.0 7.4 5.1 11.5*  NEUTROABS  --  6.3  --  10.0*  HGB 11.6* 9.8* 9.6* 9.3*  HCT 32.8* 27.8* 27.0* 26.8*  MCV 105.8* 104.1* 105.1* 106.3*  PLT 327 266 284 384   Basic Metabolic Panel: Recent Labs  Lab 02/18/18 2114 02/19/18 0242 02/20/18 0804  NA 142 135 139  K 3.6 3.3* 3.8  CL 99 95* 102  CO2 25 27 27   GLUCOSE 72 90 167*  BUN 5* 5* 8  CREATININE 0.78 0.70 0.82  CALCIUM 8.6* 7.6* 7.4*    Liver Function Tests: Recent Labs  Lab 02/18/18 2114 02/19/18 0242 02/20/18 0804  AST 282* 218* 176*  ALT 78* 64* 61*  ALKPHOS 256* 207* 203*  BILITOT 1.5* 1.3* 1.1  PROT 6.2* 5.4* 5.6*  ALBUMIN 2.6* 2.2* 2.3*   No results for input(s): LIPASE, AMYLASE in the last 168 hours. No results for input(s): AMMONIA in the last 168 hours. Coagulation Profile: Recent Labs  Lab 02/18/18 2114  INR 0.97   Cardiac Enzymes: No results for input(s): CKTOTAL, CKMB, CKMBINDEX, TROPONINI in the  last 168 hours. BNP (last 3 results) No results for input(s): PROBNP in the last 8760 hours. CBG: No results for input(s): GLUCAP in the last 168 hours. Studies: No results found.  Scheduled Meds: . benzonatate  100 mg Oral TID  . calcium-vitamin D  1 tablet Oral Daily  . cholecalciferol  2,000 Units Oral Daily  . dextromethorphan-guaiFENesin  1 tablet Oral BID  . fluticasone furoate-vilanterol  1 puff Inhalation Daily  . ipratropium-albuterol  3 mL Nebulization TID  . LORazepam  0-4 mg Intravenous Q6H   Or  . LORazepam  0-4 mg Oral Q6H  . [START ON 02/21/2018] LORazepam  0-4 mg Intravenous Q12H   Or  . [START ON 02/21/2018] LORazepam  0-4 mg Oral Q12H  . methylPREDNISolone (SOLU-MEDROL) injection  60 mg Intravenous Q6H  . metoprolol tartrate  25 mg Oral BID  . multivitamin with minerals  2 tablet Oral q morning - 10a  . nicotine  21 mg Transdermal Daily  . thiamine  100 mg Oral Daily   Or  . thiamine  100 mg Intravenous Daily  . triamcinolone ointment   Topical BID   Continuous Infusions: . sodium chloride 100 mL/hr at 02/20/18 0626  . ampicillin-sulbactam (UNASYN) IV Stopped (02/20/18 1218)  . azithromycin Stopped (02/19/18 2315)   PRN Meds: ALPRAZolam, hydrOXYzine, ipratropium-albuterol  Time spent: 35 minutes  Author: Berle Mull, MD [facility] Pager: [phone] 02/20/2018 3:36 PM  If 7PM-7AM, please contact night-coverage at [email or web address], password [facility]

## 2018-02-20 NOTE — Progress Notes (Signed)
PULMONARY / CRITICAL CARE MEDICINE   Name: Jenna Marquez MRN: [id number] DOB: [birth year]    ADMISSION DATE:  02/18/2018 CONSULTATION DATE:  02/18/2018  REFERRING MD: Dr. Jonelle Sidle  CHIEF COMPLAINT: Hemoptysis  HISTORY OF PRESENT ILLNESS:   This is a pleasant 72 year old female who has a past medical history significant for COPD, tobacco abuse and alcohol abuse who came to the [facility] long emergency department complaining of hemoptysis and vaginal bleeding.  The patient states that she was in her usual state of health until 1 week prior to admission when she noted the onset of passing bloody menses for the first time in 20 years.  Several days after this she started coughing up blood.  She said she started coughing up a tablespoon once or twice a day with clots 4 days prior to admission.  This has persisted ever since.  She denies any change in her baseline shortness of breath.  She has been producing mucus but no more than typical.  She denies fevers or chills.  She says that she has experienced nosebleeds in the past but this is not been a problem for her lately.  She has had some trouble swallowing over the last several months but she attributes this to a lack of taste rather than any sort of pain or physical obstruction.  She denies taking any blood thinners recently.  She says she has not had a change in her diet.  She has actually gained weight recently which she attributes to fluid retention.  SUBJECTIVE:  No events overnight, no new complaints Hemoptysis improving  VITAL SIGNS: BP (!) 118/54 (BP Location: Left Arm)   Pulse 97   Temp 97.6 F (36.4 C)   Resp 20   Ht 5\' 6"  (1.676 m)   Wt 143 lb (64.9 kg)   SpO2 96%   BMI 23.08 kg/m   HEMODYNAMICS:    VENTILATOR SETTINGS:    INTAKE / OUTPUT: I/O last 3 completed shifts: In: 5122.1 [P.O.:960; I.V.:3312.1; IV Piggyback:850] Out: 1000 [Urine:1000]  PHYSICAL EXAMINATION: Gen: Chronically ill appearing female, NAD HENT: [place]/AT, PERRL,  EOM-I and MMM PULM: Diffuse wheezing CV: RRR, Nl S1/S2 and -M/R/G GI: Soft, NT, ND and +BS Derm: -edema and -tenderness Neuro: Alert and interactive, movingall ext to command  LABS:  BMET Recent Labs  Lab 02/18/18 2114 02/19/18 0242 02/20/18 0804  NA 142 135 139  K 3.6 3.3* 3.8  CL 99 95* 102  CO2 25 27 27   BUN 5* 5* 8  CREATININE 0.78 0.70 0.82  GLUCOSE 72 90 167*   Electrolytes Recent Labs  Lab 02/18/18 2114 02/19/18 0242 02/20/18 0804  CALCIUM 8.6* 7.6* 7.4*   CBC Recent Labs  Lab 02/19/18 0242 02/19/18 1106 02/20/18 0804  WBC 7.4 5.1 11.5*  HGB 9.8* 9.6* 9.3*  HCT 27.8* 27.0* 26.8*  PLT 266 284 320   Coag's Recent Labs  Lab 02/18/18 2114  APTT 29  INR 0.97   Sepsis Markers No results for input(s): LATICACIDVEN, PROCALCITON, O2SATVEN in the last 168 hours.  ABG No results for input(s): PHART, PCO2ART, PO2ART in the last 168 hours.  Liver Enzymes Recent Labs  Lab 02/18/18 2114 02/19/18 0242 02/20/18 0804  AST 282* 218* 176*  ALT 78* 64* 61*  ALKPHOS 256* 207* 203*  BILITOT 1.5* 1.3* 1.1  ALBUMIN 2.6* 2.2* 2.3*    Cardiac Enzymes No results for input(s): TROPONINI, PROBNP in the last 168 hours.  Glucose No results for input(s): GLUCAP in the last 168 hours.  Imaging US Pelvic Complete With Transvaginal  Result Date: 02/19/2018 CLINICAL DATA:  72 y/o F; vaginal bleeding and spotting. History of tubal ligation. EXAM: TRANSABDOMINAL AND TRANSVAGINAL ULTRASOUND OF PELVIS TECHNIQUE: Both transabdominal and transvaginal ultrasound examinations of the pelvis were performed. Transabdominal technique was performed for global imaging of the pelvis including uterus, ovaries, adnexal regions, and pelvic cul-de-sac. It was necessary to proceed with endovaginal exam following the transabdominal exam to visualize the endometrium and adnexa. COMPARISON:  None FINDINGS: Uterus Measurements: 6.9 x 2.7 x 4.3 cm. Myoma measuring 2.4 x 3.2 x 2.8 cm of the right  anterior fundus and submucosal location protruding into the endometrial cavity (image 57). Right mid myometrial myoma measuring 2.2 x 2.1 x 1.5 cm. Endometrium Thickness: 6 mm. Trace fluid at the lower uterine segment/cervical os. Right ovary Not visualized.  No adnexal lesion identified. Left ovary Not visualized.  No adnexal lesion identified. Other findings Trace simple fluid in the pelvis, likely physiologic. 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 Trace simple fluid within the lower uterine segment/cervix. Electronically Signed   By: Kristine Garbe M.D.   On: 02/19/2018 15:26     STUDIES:  February 18, 2017 CT chest images independently reviewed showing consolidation in the right lower lobe, no clear airway lesion, some upper lobe predominant emphysema noted  CULTURES: 7/8 blood >  7/7 resp >   ANTIBIOTICS: 7/7 unasyn >  7/7 azithro >   SIGNIFICANT EVENTS:   LINES/TUBES:  I reviewed CT of the chest myself, RLL infiltrate noted, minimal  DISCUSSION: 72 year old female with a past medical history significant for tobacco abuse, COPD, and alcohol abuse presents with concomitant vaginal bleeding and hemoptysis.  It is not clear to me why these 2 events are happening at the same time as there does not appear to be an underlying coagulopathy or other cause of systemic bleeding.  Further, though the CT scan is abnormal there is no clinical symptoms of pneumonia to explain the hemoptysis.  Given her high risk of lung malignancy I think she needs to have an airway exam to make sure there is no evidence of an endobronchial lesion.  Discussed with PCCM-NP.  ASSESSMENT / PLAN:  PULMONARY A: Hemoptysis Tobacco abuse COPD exacerbation Possible pneumonia P:   Continue abx, specially with unasyn given etoh abuse and potential for aspiration Solumedrol for COPD exacerbation No need for bronchoscopy given the  high rate of atypical cells with infection but will be needed as outpatient, for now recommend finishing treatment with abx then f/u CT in 6-8 wks with f/u with the pulmonary clinic for evaluation if a bronchoscopy is still still needed.  Will not make appointment yet as I am not sure when patient will be leaving. F/u on cultures BD as ordered for COPD Smoking cessation  PCCM will sign off, please call back if needed.  Rush Farmer, M.D. [facility] Pulmonary/Critical Care Medicine. Pager: [phone]. After hours pager: [phone].  02/20/2018, 8:40 AM

## 2018-02-21 ENCOUNTER — Ambulatory Visit (HOSPITAL_COMMUNITY): Payer: PPO

## 2018-02-21 ENCOUNTER — Encounter (HOSPITAL_COMMUNITY)
Admission: EM | Disposition: A | Discharge status: Home Health | Payer: Self-pay | Source: Home / Self Care | Attending: Internal Medicine

## 2018-02-21 DIAGNOSIS — I10 Essential (primary) hypertension: Secondary | ICD-10-CM

## 2018-02-21 DIAGNOSIS — I480 Paroxysmal atrial fibrillation: Secondary | ICD-10-CM

## 2018-02-21 DIAGNOSIS — F322 Major depressive disorder, single episode, severe without psychotic features: Secondary | ICD-10-CM

## 2018-02-21 DIAGNOSIS — N183 Chronic kidney disease, stage 3 (moderate): Secondary | ICD-10-CM

## 2018-02-21 DIAGNOSIS — J449 Chronic obstructive pulmonary disease, unspecified: Secondary | ICD-10-CM

## 2018-02-21 LAB — URINE CULTURE: Culture: >=100000 — AB

## 2018-02-21 LAB — COMPREHENSIVE METABOLIC PANEL
ALBUMIN: 2.4 g/dL — AB (ref 3.5–5.0)
ALT: 61 U/L — AB (ref 0–44)
AST: 163 U/L — AB (ref 15–41)
Alkaline Phosphatase: 180 U/L — ABNORMAL HIGH (ref 38–126)
Anion gap: 9 (ref 5–15)
BUN: 10 mg/dL (ref 8–23)
CALCIUM: 7.3 mg/dL — AB (ref 8.9–10.3)
CHLORIDE: 107 mmol/L (ref 98–111)
CO2: 26 mmol/L (ref 22–32)
Creatinine, Ser: 0.77 mg/dL (ref 0.44–1.00)
GFR calc Af Amer: >60 mL/min (ref >60)
GFR calc non Af Amer: >60 mL/min (ref >60)
GLUCOSE: 133 mg/dL — AB (ref 70–99)
Potassium: 3.8 mmol/L (ref 3.5–5.1)
Sodium: 142 mmol/L (ref 135–145)
Total Bilirubin: 1 mg/dL (ref 0.3–1.2)
Total Protein: 5.6 g/dL — ABNORMAL LOW (ref 6.5–8.1)

## 2018-02-21 LAB — CBC
HCT: 25.7 % — ABNORMAL LOW (ref 36.0–46.0)
HEMOGLOBIN: 8.8 g/dL — AB (ref 12.0–15.0)
MCH: 37 pg — AB (ref 26.0–34.0)
MCHC: 34.2 g/dL (ref 30.0–36.0)
MCV: 108 fL — ABNORMAL HIGH (ref 78.0–100.0)
PLATELETS: 312 10*3/uL (ref 150–400)
RBC: 2.38 MIL/uL — AB (ref 3.87–5.11)
RDW: 14.2 % (ref 11.5–15.5)
WBC: 11.3 10*3/uL — AB (ref 4.0–10.5)

## 2018-02-21 SURGERY — BRONCHOSCOPY, WITH FLUOROSCOPY
Anesthesia: Moderate Sedation | Laterality: Bilateral

## 2018-02-21 MED ORDER — IPRATROPIUM-ALBUTEROL 0.5-2.5 (3) MG/3ML IN SOLN
3.0000 mL | Freq: Four times a day (QID) | RESPIRATORY_TRACT | Status: DC
  Administered 2018-02-22 (×2): 3 mL via RESPIRATORY_TRACT
  Filled 2018-02-21 (×2): qty 3

## 2018-02-21 MED ORDER — METHYLPREDNISOLONE SODIUM SUCC 125 MG IJ SOLR
60.0000 mg | Freq: Every day | INTRAMUSCULAR | Status: DC
  Administered 2018-02-22: 60 mg via INTRAVENOUS
  Filled 2018-02-21: qty 2

## 2018-02-21 MED ORDER — ALBUTEROL SULFATE (2.5 MG/3ML) 0.083% IN NEBU
2.5000 mg | INHALATION_SOLUTION | RESPIRATORY_TRACT | Status: DC | PRN
  Administered 2018-02-22 (×2): 2.5 mg via RESPIRATORY_TRACT
  Filled 2018-02-21 (×2): qty 3

## 2018-02-21 MED ORDER — IPRATROPIUM-ALBUTEROL 0.5-2.5 (3) MG/3ML IN SOLN
3.0000 mL | RESPIRATORY_TRACT | Status: DC
  Administered 2018-02-21 (×4): 3 mL via RESPIRATORY_TRACT
  Filled 2018-02-21 (×4): qty 3

## 2018-02-21 NOTE — Progress Notes (Signed)
PROGRESS NOTE    Jenna Marquez  VEH:[id number] DOB: [birth year] DOA: 02/18/2018 PCP: Darreld Mclean, MD   Brief Narrative: Jenna Marquez is a 72 y.o. female with a history of alcohol abuse, COPD, A. fib S/P PPM implant, not on anticoagulation, CKD 3, HTN, GERD. She presented with hemoptysis and was found to have a right lower lobe pneumonia. Hospital course has been complicated for patient's passive suicidal ideation and alcohol withdrawal symptoms.   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   Tobacco abuse   CKD (chronic kidney disease) stage 3, GFR 30-59 ml/min (HCC)   Paroxysmal atrial fibrillation (HCC)   HTN (hypertension)   Hemoptysis   Community acquired pneumonia   Vagina bleeding   Alcohol abuse   Pneumonia   Right lower lobe pneumonia Aspiration is likely diagnosis. Complicated by COPD. Initially started on ceftriaxone/azithromycin but transitioned to Unasyn for aspiration coverage. -Continue Unasyn -Blood cultures pending  Hemoptysis In setting of pneumonia. Resolved. Hemoglobin slightly down today -Repeat CBC in AM  COPD Significant wheezing on exam. On room air. Question anxiety component. -Continue Duonebs and Solu-medrol -Continue  Depression Appears severe. Patient with passive suicidal ideation. No inpatient behavioral health admission per psychiatry recommendations. -Continue Xanax -Patient declining medication therapy for treatment. Discussed therapy with her as an alternative. Social work consulted for help.  CKD stage 3 Patient with a baseline creatinine of 0.8-0.9. Currently baseline.  Paroxysmal atrial fibrillation Rate controlled. Not on anticoagulation. S/p PPM. -Continue metoprolol  Alcohol abuse Per patient, secondary to depression. CIWA scores elevated. -Continue  CIWA    DVT prophylaxis: SCDs Code Status:   Code Status: Full Code Family Communication: None at bedside Disposition Plan: Discharge home when medically stable   Consultants:   Pulmonology  Psychiatry  Procedures:   None  Antimicrobials:  Ceftriaxone  Azithromycin  Unasyn    Subjective: Depressed. Coughing with production. No dyspnea.  Objective: Vitals:   02/21/18 0611 02/21/18 0810 02/21/18 0933 02/21/18 1101  BP:   (!) 142/66   Pulse:  (!) 111 (!) 112 95  Resp:  20  20  Temp:      TempSrc:      SpO2: 93% 95%  96%  Weight:      Height:        Intake/Output Summary (Last 24 hours) at 02/21/2018 1236 Last data filed at 02/21/2018 0550 Gross per 24 hour  Intake 2960.83 ml  Output 200 ml  Net 2760.83 ml   Filed Weights   02/18/18 1927  Weight: 64.9 kg (143 lb)    Examination:  General exam: Appears calm and comfortable Respiratory system: Significant wheezing bilaterally. Respiratory effort normal. Cardiovascular system: S1 & S2 heard, RRR. No murmurs, rubs, gallops or clicks. Gastrointestinal system: Abdomen is nondistended, soft and nontender. No organomegaly or masses felt. Normal bowel sounds heard. Central nervous system: Alert and oriented. No focal neurological deficits. Extremities: No edema. No calf tenderness Skin: No cyanosis. No rashes Psychiatry: Depressed mood. Flat affect. No active SI   Data Reviewed: I have personally reviewed following labs and imaging studies  CBC: Recent Labs  Lab 02/18/18 2114 02/19/18 0242 02/19/18 1106 02/20/18 0804 02/21/18 0533  WBC 8.0 7.4 5.1 11.5* 11.3*  NEUTROABS  --  6.3  --  10.0*  --   HGB 11.6* 9.8* 9.6* 9.3* 8.8*  HCT 32.8* 27.8* 27.0*  26.8* 25.7*  MCV 105.8* 104.1* 105.1* 106.3* 108.0*  PLT 327 266 284 320 124   Basic Metabolic Panel: Recent Labs  Lab 02/18/18 2114 02/19/18 0242 02/20/18 0804 02/21/18 0533  NA 142 135 139 142  K 3.6 3.3* 3.8 3.8  CL 99 95* 102 107  CO2 25  27 27 26   GLUCOSE 72 90 167* 133*  BUN 5* 5* 8 10  CREATININE 0.78 0.70 0.82 0.77  CALCIUM 8.6* 7.6* 7.4* 7.3*   GFR: Estimated Creatinine Clearance: 57.8 mL/min (by C-G formula based on SCr of 0.77 mg/dL). Liver Function Tests: Recent Labs  Lab 02/18/18 2114 02/19/18 0242 02/20/18 0804 02/21/18 0533  AST 282* 218* 176* 163*  ALT 78* 64* 61* 61*  ALKPHOS 256* 207* 203* 180*  BILITOT 1.5* 1.3* 1.1 1.0  PROT 6.2* 5.4* 5.6* 5.6*  ALBUMIN 2.6* 2.2* 2.3* 2.4*   No results for input(s): LIPASE, AMYLASE in the last 168 hours. No results for input(s): AMMONIA in the last 168 hours. Coagulation Profile: Recent Labs  Lab 02/18/18 2114  INR 0.97   Cardiac Enzymes: No results for input(s): CKTOTAL, CKMB, CKMBINDEX, TROPONINI in the last 168 hours. BNP (last 3 results) No results for input(s): PROBNP in the last 8760 hours. HbA1C: No results for input(s): HGBA1C in the last 72 hours. CBG: No results for input(s): GLUCAP in the last 168 hours. Lipid Profile: No results for input(s): CHOL, HDL, LDLCALC, TRIG, CHOLHDL, LDLDIRECT in the last 72 hours. Thyroid Function Tests: No results for input(s): TSH, T4TOTAL, FREET4, T3FREE, THYROIDAB in the last 72 hours. Anemia Panel: Recent Labs    02/19/18 1106  RETICCTPCT 1.8   Sepsis Labs: No results for input(s): PROCALCITON, LATICACIDVEN in the last 168 hours.  Recent Results (from the past 240 hour(s))  Wet prep, genital     Status: Abnormal   Collection Time: 02/18/18  9:14 PM  Result Value Ref Range Status   Yeast Wet Prep HPF POC NONE SEEN NONE SEEN Final   Trich, Wet Prep NONE SEEN NONE SEEN Final   Clue Cells Wet Prep HPF POC NONE SEEN NONE SEEN Final   WBC, Wet Prep HPF POC MODERATE (A) NONE SEEN Final   Sperm NONE SEEN  Final    Comment: Performed at [facility], [place] [address] [place], [place] [address]  Urine culture     Status: Abnormal   Collection Time: 02/18/18 11:57 PM  Result Value Ref  Range Status   Specimen Description   Final    URINE, CLEAN CATCH Performed at [facility], [place] [address] [place], [place] [address]    Special Requests   Final    NONE Performed at [facility], [place] [address], [place], [place] [address]    Culture >=100,000 COLONIES/mL ESCHERICHIA COLI (A)  Final   Report Status 02/21/2018 FINAL  Final   Organism ID, Bacteria ESCHERICHIA COLI (A)  Final      Susceptibility   Escherichia coli - MIC*    AMPICILLIN 8 SENSITIVE Sensitive     CEFAZOLIN <=4 SENSITIVE Sensitive     CEFTRIAXONE <=1 SENSITIVE Sensitive     CIPROFLOXACIN <=0.25 SENSITIVE Sensitive     GENTAMICIN <=1 SENSITIVE Sensitive     IMIPENEM <=0.25 SENSITIVE Sensitive     NITROFURANTOIN 64 INTERMEDIATE Intermediate     TRIMETH/SULFA <=20 SENSITIVE Sensitive     AMPICILLIN/SULBACTAM 4 SENSITIVE Sensitive     PIP/TAZO <=4 SENSITIVE Sensitive     Extended  ESBL NEGATIVE Sensitive     * >=100,000 COLONIES/mL ESCHERICHIA COLI  Culture, blood (routine x 2) Call MD if unable to obtain prior to antibiotics being given     Status: None (Preliminary result)   Collection Time: 02/19/18  2:42 AM  Result Value Ref Range Status   Specimen Description   Final    BLOOD LEFT WRIST Performed at [facility], [address], [place], [place] [address]    Special Requests   Final    BOTTLES DRAWN AEROBIC ONLY Blood Culture results may not be optimal due to an inadequate volume of blood received in culture bottles Performed at [place] [address] [place], [place] [address]    Culture   Final    NO GROWTH 1 DAY Performed at [facility], [place] [address], [place], [place] [address]    Report Status PENDING  Incomplete  Culture, blood (routine x 2) Call MD if unable to obtain prior to antibiotics being given     Status: None (Preliminary result)   Collection Time: 02/19/18  2:42 AM  Result Value Ref Range Status    Specimen Description   Final    BLOOD LEFT Performed at [place] [address], [place], [place] [address]    Special Requests   Final    BOTTLES DRAWN AEROBIC ONLY Blood Culture results may not be optimal due to an inadequate volume of blood received in culture bottles Performed at [place] [address], [place], [place] [address]    Culture   Final    NO GROWTH 1 DAY Performed at [facility], [place] [address], [place], [place] [address]    Report Status PENDING  Incomplete  Culture, sputum-assessment     Status: None   Collection Time: 02/19/18  4:29 AM  Result Value Ref Range Status   Specimen Description SPUTUM  Final   Special Requests NONE  Final   Sputum evaluation   Final    Sputum specimen not acceptable for testing.  Please recollect.   RESULT CALLED TO, READ BACK BY AND VERIFIED WITH: MICHELLE RN AT 5188 02/20/18 BY TIBBITTS,K Performed at [place] [address], [place],  [address]    Report Status 02/20/2018 FINAL  Final         Radiology Studies: US Pelvic Complete With Transvaginal  Result Date: 02/19/2018 CLINICAL DATA:  72 y/o F; vaginal bleeding and spotting. History of tubal ligation. EXAM: TRANSABDOMINAL AND TRANSVAGINAL ULTRASOUND OF PELVIS TECHNIQUE: Both transabdominal and transvaginal ultrasound examinations of the pelvis were performed. Transabdominal technique was performed for global imaging of the pelvis including uterus, ovaries, adnexal regions, and pelvic cul-de-sac. It was necessary to proceed with endovaginal exam following the transabdominal exam to visualize the endometrium and adnexa. COMPARISON:  None FINDINGS: Uterus Measurements: 6.9 x 2.7 x 4.3 cm. Myoma measuring 2.4 x 3.2 x 2.8 cm of the right anterior fundus and submucosal location protruding into the endometrial cavity (image 57). Right mid myometrial myoma measuring 2.2 x 2.1 x 1.5 cm. Endometrium  Thickness: 6 mm. Trace fluid at the lower uterine segment/cervical os. Right ovary Not visualized.  No adnexal lesion identified. Left ovary Not visualized.  No adnexal lesion identified. Other findings Trace simple fluid in the pelvis, likely physiologic. 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 Trace simple fluid within the lower uterine segment/cervix. Electronically  Signed   By: Kristine Garbe M.D.   On: 02/19/2018 15:26        Scheduled Meds: . benzonatate  100 mg Oral TID  . calcium-vitamin D  1 tablet Oral Daily  . cholecalciferol  2,000 Units Oral Daily  . dextromethorphan-guaiFENesin  1 tablet Oral BID  . fluticasone furoate-vilanterol  1 puff Inhalation Daily  . ipratropium-albuterol  3 mL Nebulization Q4H  . LORazepam  0-4 mg Intravenous Q12H   Or  . LORazepam  0-4 mg Oral Q12H  . methylPREDNISolone (SOLU-MEDROL) injection  60 mg Intravenous Q6H  . metoprolol tartrate  25 mg Oral BID  . multivitamin with minerals  2 tablet Oral q morning - 10a  . nicotine  21 mg Transdermal Daily  . thiamine  100 mg Oral Daily   Or  . thiamine  100 mg Intravenous Daily  . triamcinolone ointment   Topical BID   Continuous Infusions: . sodium chloride 100 mL/hr at 02/21/18 0941  . ampicillin-sulbactam (UNASYN) IV 1.5 g (02/21/18 1213)  . azithromycin Stopped (02/20/18 2215)     LOS: 2 days     Cordelia Poche, MD [facility] 02/21/2018, 12:36 PM Pager: [phone]  If 7PM-7AM, please contact night-coverage [email or web address] 02/21/2018, 12:36 PM

## 2018-02-21 NOTE — Progress Notes (Signed)
CSW consulted for "Behavioral health issues impacting hospitalization/discharge".  Patient was seen by psychiatry and was psychiatrically cleared. Psychiatrist recommended that CSW follow up with patient and provide patient with resources for local therapists and day programs for senior citizens.  CSW attempted to speak with patient about psychiatrist recommendations. CSW introduced self and role, patient reported that she is currently upset. CSW agreed to follow up at a later time.  Jenna Marquez, [place] Social Worker [facility] Cell#: [phone]

## 2018-02-22 DIAGNOSIS — N390 Urinary tract infection, site not specified: Secondary | ICD-10-CM

## 2018-02-22 DIAGNOSIS — B9620 Unspecified Escherichia coli [E. coli] as the cause of diseases classified elsewhere: Secondary | ICD-10-CM

## 2018-02-22 DIAGNOSIS — N939 Abnormal uterine and vaginal bleeding, unspecified: Secondary | ICD-10-CM

## 2018-02-22 DIAGNOSIS — Z720 Tobacco use: Secondary | ICD-10-CM

## 2018-02-22 HISTORY — DX: Unspecified Escherichia coli (E. coli) as the cause of diseases classified elsewhere: B96.20

## 2018-02-22 HISTORY — DX: Urinary tract infection, site not specified: N39.0

## 2018-02-22 LAB — COMPREHENSIVE METABOLIC PANEL
ALBUMIN: 2.4 g/dL — AB (ref 3.5–5.0)
ALT: 78 U/L — ABNORMAL HIGH (ref 0–44)
ANION GAP: 10 (ref 5–15)
AST: 219 U/L — ABNORMAL HIGH (ref 15–41)
Alkaline Phosphatase: 163 U/L — ABNORMAL HIGH (ref 38–126)
BUN: 11 mg/dL (ref 8–23)
CHLORIDE: 109 mmol/L (ref 98–111)
CO2: 24 mmol/L (ref 22–32)
Calcium: 7.4 mg/dL — ABNORMAL LOW (ref 8.9–10.3)
Creatinine, Ser: 0.78 mg/dL (ref 0.44–1.00)
GFR calc non Af Amer: >60 mL/min (ref >60)
Glucose, Bld: 91 mg/dL (ref 70–99)
POTASSIUM: 3.9 mmol/L (ref 3.5–5.1)
SODIUM: 143 mmol/L (ref 135–145)
Total Bilirubin: 1.2 mg/dL (ref 0.3–1.2)
Total Protein: 5.5 g/dL — ABNORMAL LOW (ref 6.5–8.1)

## 2018-02-22 LAB — CBC
HCT: 27.3 % — ABNORMAL LOW (ref 36.0–46.0)
HEMOGLOBIN: 9.2 g/dL — AB (ref 12.0–15.0)
MCH: 36.9 pg — AB (ref 26.0–34.0)
MCHC: 33.7 g/dL (ref 30.0–36.0)
MCV: 109.6 fL — ABNORMAL HIGH (ref 78.0–100.0)
Platelets: 313 10*3/uL (ref 150–400)
RBC: 2.49 MIL/uL — AB (ref 3.87–5.11)
RDW: 14.5 % (ref 11.5–15.5)
WBC: 10.2 10*3/uL (ref 4.0–10.5)

## 2018-02-22 MED ORDER — AMOXICILLIN-POT CLAVULANATE 875-125 MG PO TABS: 1.0000 | ORAL_TABLET | Freq: Two times a day (BID) | ORAL | 0 refills | Status: AC

## 2018-02-22 MED ORDER — NICOTINE 21 MG/24HR TD PT24: 21.0000 mg | MEDICATED_PATCH | Freq: Every day | TRANSDERMAL | 0 refills | Status: DC

## 2018-02-22 MED ORDER — PREDNISONE 20 MG PO TABS: ORAL_TABLET | ORAL | 0 refills | Status: DC

## 2018-02-22 NOTE — Progress Notes (Signed)
SATURATION QUALIFICATIONS: (This note is used to comply with regulatory documentation for home oxygen)  Patient Saturations on Room Air at Rest = 90%  Patient Saturations on Room Air while Ambulating = 88%  Patient Saturations on 2 Liters of oxygen while Ambulating = 93%  Please briefly explain why patient needs home oxygen: Pt has increased short of breath when ambulating on room air.

## 2018-02-22 NOTE — Progress Notes (Signed)
Pt ambulated on room air and O2 sat dropped to 87%. Pt was placed on 2 LNC and was able to bring O2 Sat up to 93%.

## 2018-02-22 NOTE — Consult Note (Signed)
   [facility] CM Inpatient Consult   02/22/2018  Jenna Marquez Apr 19, 1946 [id number]    [facility] Care Management hospital liaison follow up.  Spoke with inpatient RNCM. Patient to discharge today.   This hospital liaison spoke with patient at bedside on 02/20/18 and [facility] written consents were obtained at that time.  Will make referral to [place] team for [facility] RNCM and [facility] LCSW.  PCP office [facility]) is listed as doing transition of care calls post discharge.  Medical history of COPD, paroxysmal afib pacer, HTN, ETOH, depression, CKD, GERD, HTN   Marthenia Rolling, MSN-Ed, RN,BSN [facility] Liaison [phone]

## 2018-02-22 NOTE — Progress Notes (Signed)
Pt wasted half her nebulizer complaining about duoneb being given instead of albuterol and atrovent separately. She talked to me in an hateful and inappropriate manner. She was upset that her nebulizer did not last longer. I took her nebulizer only after all medication had been given.

## 2018-02-22 NOTE — Discharge Instructions (Addendum)
Jenna Marquez,  You were seen by psychiatry because of your depression. Although you do not meet inpatient criteria, it is important that if you start to feel significantly depressed to the point of wanting to harm yourself, that you call 911 immediately. With regard to your pneumonia, you will go home on antibiotics.  Major Depressive Disorder, Adult Major depressive disorder (MDD) is a mental health condition. MDD often makes you feel sad, hopeless, or helpless. MDD can also cause symptoms in your body. MDD can affect your:  Work.  School.  Relationships.  Other normal activities.  MDD can range from mild to very bad. It may occur once (single episode MDD). It can also occur many times (recurrent MDD). The main symptoms of MDD often include:  Feeling sad, depressed, or irritable most of the time.  Loss of interest.  MDD symptoms also include:  Sleeping too much or too little.  Eating too much or too little.  A change in your weight.  Feeling tired (fatigue) or having low energy.  Feeling worthless.  Feeling guilty.  Trouble making decisions.  Trouble thinking clearly.  Thoughts of suicide or harming others.  Feeling weak.  Feeling agitated.  Keeping yourself from being around other people (isolation).  Follow these instructions at home: Activity  Do these things as told by your doctor: ? Go back to your normal activities. ? Exercise regularly. ? Spend time outdoors. Alcohol  Talk with your doctor about how alcohol can affect your antidepressant medicines.  Do not drink alcohol. Or, limit how much alcohol you drink. ? This means no more than 1 drink a day for nonpregnant women and 2 drinks a day for men. One drink equals one of these:  12 oz of beer.  5 oz of wine.  1 oz of hard liquor. General instructions  Take over-the-counter and prescription medicines only as told by your doctor.  Eat a healthy diet.  Get plenty of sleep.  Find  activities that you enjoy. Make time to do them.  Think about joining a support group. Your doctor may be able to suggest a group for you.  Keep all follow-up visits as told by your doctor. This is important. Where to find more information:  [employer] on Mental Illness: ? www.nami.[place]: ? https://carter.com/  National Suicide Prevention Lifeline: ? (641)241-6851. This is free, 24-hour help. Contact a doctor if:  Your symptoms get worse.  You have new symptoms. Get help right away if:  You self-harm.  You see, hear, taste, smell, or feel things that are not present (hallucinate). If you ever feel like you may hurt yourself or others, or have thoughts about taking your own life, get help right away. You can go to your nearest emergency department or call:  Your local emergency services (911 in the [place]  A suicide crisis helpline, such as the National Suicide Prevention Lifeline: ? (707) 763-8478. This is open 24 hours a day.  This information is not intended to replace advice given to you by your health care provider. Make sure you discuss any questions you have with your health care provider. Document Released: 07/13/2015 Document Revised: 04/17/2016 Document Reviewed: 04/17/2016 Elsevier Interactive Patient Education  2017 [employer].

## 2018-02-22 NOTE — Progress Notes (Signed)
PT Cancellation Note  Patient Details Name: Jenna Marquez MRN: [id number] DOB: 07-27-1946   Cancelled Treatment:    Reason Eval/Treat Not Completed: Spoke with RN who stated pt walked well with a RW in the hallway. RN stated pt already has a walker at home.     Weston Anna, MPT Pager: [phone]

## 2018-02-22 NOTE — Progress Notes (Signed)
PT Cancellation Note  Patient Details Name: Jenna Marquez MRN: [id number] DOB: 1945-09-01   Cancelled Treatment:    Reason Eval/Treat Not Completed: Attemtped PT evaluation-pt refused to participate despite encouragement from therapist and RN.    Weston Anna, MPT Pager: [phone]

## 2018-02-22 NOTE — Care Management Note (Signed)
Case Management Note  Patient Details  Name: Jenna Marquez MRN: [id number] Date of Birth: 07/21/1946  Subjective/Objective:  PT recc HHPT. Qualifies for home 02-[facility] chosen for HHPT, [place] will bring home 02 travel tank to rm prior d/c. No further CM needs.                  Action/Plan:d/c home w/HHC/dme.   Expected Discharge Date:  02/22/18               Expected Discharge Plan:  [place]  In-House Referral:     Discharge planning Services  CM Consult  Post Acute Care Choice:    Choice offered to:  Patient  DME Arranged:  Oxygen DME Agency:  [place]:  PT [facility] Agency:  [place]  Status of Service:  Completed, signed off  If discussed at [place] of Stay Meetings, dates discussed:    Additional Comments:  Dessa Phi, RN 02/22/2018, 3:51 PM

## 2018-02-22 NOTE — Discharge Summary (Signed)
Physician Discharge Summary  Meris Reede WUJ:[id number] DOB: [birth year] DOA: 02/18/2018  PCP: Darreld Mclean, MD  Admit date: 02/18/2018 Discharge date: 02/22/2018  Admitted From: Home Disposition: Home  Recommendations for Outpatient Follow-up:  1. Follow up with PCP in 1 week 2. Outpatient therapy follow-up 3. Please obtain BMP/CBC in one week 4. Follow up CT in 6 weeks with pulmonology 5. Please follow up on the following pending results: Blood culture final result  Home Health: PT Equipment/Devices: Oxygen  Discharge Condition: Stable CODE STATUS: Full code Diet recommendation: Heart healthy   Brief/Interim Summary:  Admission HPI written by Elwyn Reach, MD   Chief Complaint: Hemoptysis  HPI: Jenna Marquez is a 72 y.o. female with medical history significant of alcohol abuse, COPD, paroxysmal atrial fibrillation with pacemaker in place not on any anticoagulation, chronic kidney disease stage III, hypertension as well as GERD who presented to the ER with multiple episodes of hemoptysis at home describes it as a teaspoon size.  Associated with cough but no fever or chills.  Patient also reported episode of vaginal bleeding.  She is not on her.  At the moment.  She has shortness of breath at rest.  She has been drinking alcohol up until yesterday.  She also has ongoing tobacco abuse with history of COPD and occasional exacerbation.  She has baseline wheezing and shortness of breath use her inhalers.  Patient's vaginal bleeding is spotty.  She has not had.  For almost 20 years.  No abdominal pain.  No melena no bright red blood per rectum.  She denied any significant chest pain.  Patient was evaluated in the ER with findings suggestive of pneumonia although pulmonary hemorrhage cannot be excluded.  Patient also has  dermatitis for which she is on treatment with steroid.  Patient also has severe depression.  She reports other nonspecific complaints including generalized  body aches.  ED Course: Patient did not have any hemoptysis in the ER, temperature is 97 for blood pressure 167/66 pulse 117 respirate of 22 oxygen sat 100% room air.  Her white count is 8.0 with hemoglobin 11.6 platelets 327.  Sodium is 142 with potassium 3.6 chloride 99 CO2 25 BUN of 5 and creatinine 0.78 calcium 8.6.  INR is 0.97.  CT angiogram of the chest showed no PE.  Opacification of the central portion of the right lower lobe favoring pneumonia although pulmonary hemorrhage could have a similar appearance.  Abdominal ultrasound showed no acute findings except for hepatic steatosis.  Alcohol level is 41.  Patient started on azithromycin and ceftriaxone and hospitalist called for admission.    Hospital course:  Right lower lobe pneumonia Aspiration is likely diagnosis. Complicated by COPD. Initially started on ceftriaxone/azithromycin but transitioned to Unasyn for aspiration coverage. Augmentin on discharge. Blood cultures no growth to date. Pulmonology follow-up in 6 weeks for repeat CT scan to rule out tumor.  Hemoptysis In setting of pneumonia. Resolved. Hemoglobin stable.  COPD Significant wheezing on exam. On room air. Question anxiety component. Treated with Duonebs and solu-medrol. Prednisone taper on discharge.  Wheezing Upper airway. Recommend ENT follow-up.  Depression Appears severe. Patient with passive suicidal ideation. No inpatient behavioral health admission per psychiatry recommendations. Continue outpatient Xanax. Patient declining pharmacotherapy. Social work consulted for outpatient resources for therapy.  CKD stage 3 Patient with a baseline creatinine of 0.8-0.9. Currently baseline.  Paroxysmal atrial fibrillation Rate controlled. Not on anticoagulation. S/p PPM. Continued metoprolol.  Alcohol abuse Per patient, secondary to depression. CIWA  scores elevated but stable. No significant withdrawal symptoms.  UTI E. Coli UTI. Susceptible to penicillin.  Augmentin as prescribed above.  Vaginal bleeding Multiple uterine myomas. Recommend GYN outpatient follow-up.   Discharge Diagnoses:  Principal Problem:   MDD (major depressive disorder), single episode, severe , no psychosis ([facility]) Active Problems:   COPD (chronic obstructive pulmonary disease) gold stage C.   Tobacco abuse   CKD (chronic kidney disease) stage 3, GFR 30-59 ml/min (HCC)   Paroxysmal atrial fibrillation (HCC)   HTN (hypertension)   Hemoptysis   Community acquired pneumonia   Vagina bleeding   Alcohol abuse   Pneumonia   E. coli UTI    Discharge Instructions  Discharge Instructions    Diet - low sodium heart healthy   Complete by:  As directed    Increase activity slowly   Complete by:  As directed      Allergies as of 02/22/2018      Reactions   Avelox [moxifloxacin Hcl In Nacl] Itching, Rash      Medication List    STOP taking these medications   cephALEXin 500 MG capsule Commonly known as:  KEFLEX   ibuprofen 800 MG tablet Commonly known as:  ADVIL,MOTRIN     TAKE these medications   albuterol 108 (90 Base) MCG/ACT inhaler Commonly known as:  PROVENTIL HFA;VENTOLIN HFA Inhale 2 puffs into the lungs every 6 (six) hours as needed for wheezing or shortness of breath.   albuterol (2.5 MG/3ML) 0.083% nebulizer solution Commonly known as:  PROVENTIL Take 3 mLs (2.5 mg total) by nebulization every 6 (six) hours as needed for wheezing or shortness of breath.   albuterol 108 (90 Base) MCG/ACT inhaler Commonly known as:  PROAIR HFA Inhale 1-2 puffs into the lungs every 6 (six) hours as needed for wheezing or shortness of breath.   ALPRAZolam 0.5 MG tablet Commonly known as:  XANAX Take 1 tablet (0.5 mg total) by mouth every 8 (eight) hours as needed for anxiety (**DO NOT TAKE AT NIGHT WITH HYDROXYZINE**). Do not take with alcohol   amoxicillin-clavulanate 875-125 MG tablet Commonly known as:  AUGMENTIN Take 1 tablet by mouth 2 (two) times daily  for 4 days.   aspirin 81 MG EC tablet Take 1 tablet (81 mg total) by mouth daily.   benzonatate 200 MG capsule Commonly known as:  TESSALON Take 1 capsule (200 mg total) by mouth 3 (three) times daily as needed for cough.   budesonide-formoterol 160-4.5 MCG/ACT inhaler Commonly known as:  SYMBICORT Inhale 2 puffs into the lungs 2 (two) times daily.   calcium-vitamin D 250-125 MG-UNIT tablet Commonly known as:  OSCAL WITH D Take 1 tablet by mouth daily.   hydrochlorothiazide 25 MG tablet Commonly known as:  HYDRODIURIL TAKE 1/2 OR 1 DAILY AS NEEDED FOR SWELLING OF LEGS   hydrOXYzine 10 MG tablet Commonly known as:  ATARAX/VISTARIL TAKE 2 TABLETS BY MOUTH AS NEEDED FOR ITCHING. DO NOT TAKE WITH ALPRAZOLAM   ipratropium 0.02 % nebulizer solution Commonly known as:  ATROVENT Take 2.5 mLs (0.5 mg total) by nebulization every 6 (six) hours as needed for wheezing or shortness of breath.   metoprolol tartrate 25 MG tablet Commonly known as:  LOPRESSOR Take 1 tablet (25 mg total) by mouth 2 (two) times daily.   multivitamin tablet Take 2 tablets by mouth every morning.   nicotine 21 mg/24hr patch Commonly known as:  NICODERM CQ - dosed in mg/24 hours Place 1 patch (21 mg total) onto  the skin daily. Start taking on:  02/23/2018   predniSONE 20 MG tablet Commonly known as:  DELTASONE Take 60mg  x 2 days, 40mg  x 2 days, 20mg  x 3 days What changed:  additional instructions   triamcinolone ointment 0.1 % Commonly known as:  KENALOG APPLY TO AFFECTED AREA TWICE A DAY   Vitamin D 2000 units Caps Take 1 capsule by mouth daily.            Durable Medical Equipment  (From admission, onward)        Start     Ordered   02/22/18 1417  For home use only DME oxygen  Once    Question Answer Comment  Liters per Minute 2   Oxygen delivery system Gas      02/22/18 1416     Follow-up Information    Copland, Gay Filler, MD. Schedule an appointment as soon as possible for a  visit in 1 week(s).   Specialty:  Family Medicine Contact information: [address] [place] [address] [phone]        Rozetta Nunnery, MD. Schedule an appointment as soon as possible for a visit in 1 week(s).   Specialty:  Otolaryngology Why:  Upper airway wheezing Contact information: [place] [address] [phone]        [facility] Schedule an appointment as soon as possible for a visit.   Why:  Vaginal bleeding Contact information: [place] [phone]       Rigoberto Noel, MD. Schedule an appointment as soon as possible for a visit in 6 week(s).   Specialty:  Pulmonary Disease Why:  CT follow-up Contact information: [address] [place] [address] [phone]          Allergies  Allergen Reactions  . Avelox [Moxifloxacin Hcl In Nacl] Itching and Rash    Consultations:  Psychiatry   Procedures/Studies: Ct Angio Chest Pe W And/or Wo Contrast  Result Date: 02/18/2018 CLINICAL DATA:  Hemoptysis mild-to-moderate, hematemesis, not on blood thinners, history COPD, hypertension, has had vaginal bleeding for 4 days EXAM: CT ANGIOGRAPHY CHEST WITH CONTRAST TECHNIQUE: Multidetector CT imaging of the chest was performed using the standard protocol during bolus administration of intravenous contrast. Multiplanar CT image reconstructions and MIPs were obtained to evaluate the vascular anatomy. CONTRAST:  92mL ISOVUE-370 IOPAMIDOL (ISOVUE-370) INJECTION 76% IV COMPARISON:  09/10/2015 FINDINGS: Cardiovascular: LEFT subclavian transvenous pacemaker leads at RIGHT atrium and RIGHT ventricle. Atherosclerotic calcifications aorta, coronary arteries, and proximal great vessels. Aorta normal caliber without aneurysm or dissection. A small lymph node is again identified at the bifurcation of the lingular pulmonary artery. No pulmonary emboli are identified. No  pericardial effusion. Mediastinum/Nodes: Esophagus normal appearance. Base of cervical region normal appearance. No thoracic adenopathy. 8 mm short axis precarinal lymph node noted. Lungs/Pleura: Emphysematous changes. Significant RIGHT lower lobe opacity favor representing pneumonia. Central peribronchial thickening. Remaining lungs clear. No pleural effusion or pneumothorax. Upper Abdomen: Marked fatty infiltration of liver. Remaining visualized upper abdomen unremarkable old healed Musculoskeletal: Diffuse osseous demineralization. Review of the MIP images confirms the above findings. IMPRESSION: No evidence of pulmonary embolism. Atherosclerotic calcifications including coronary arteries. Emphysematous and bronchitic changes consistent with COPD. Opacification of the central portions of the RIGHT lower lobe favoring pneumonia, though pulmonary hemorrhage could have a similar appearance. Follow-up evaluation until resolution recommended to ensure resolution and exclude underlying abnormalities including tumor. Marked fatty infiltration of liver.  Aortic Atherosclerosis (ICD10-I70.0) and Emphysema (ICD10-J43.9). Electronically Signed   By: Lavonia Dana M.D.   On: 02/18/2018 23:07   US Pelvic Complete With Transvaginal  Result Date: 02/19/2018 CLINICAL DATA:  72 y/o F; vaginal bleeding and spotting. History of tubal ligation. EXAM: TRANSABDOMINAL AND TRANSVAGINAL ULTRASOUND OF PELVIS TECHNIQUE: Both transabdominal and transvaginal ultrasound examinations of the pelvis were performed. Transabdominal technique was performed for global imaging of the pelvis including uterus, ovaries, adnexal regions, and pelvic cul-de-sac. It was necessary to proceed with endovaginal exam following the transabdominal exam to visualize the endometrium and adnexa. COMPARISON:  None FINDINGS: Uterus Measurements: 6.9 x 2.7 x 4.3 cm. Myoma measuring 2.4 x 3.2 x 2.8 cm of the right anterior fundus and submucosal location protruding into  the endometrial cavity (image 57). Right mid myometrial myoma measuring 2.2 x 2.1 x 1.5 cm. Endometrium Thickness: 6 mm. Trace fluid at the lower uterine segment/cervical os. Right ovary Not visualized.  No adnexal lesion identified. Left ovary Not visualized.  No adnexal lesion identified. Other findings Trace simple fluid in the pelvis, likely physiologic. 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 Trace simple fluid within the lower uterine segment/cervix. Electronically Signed   By: Kristine Garbe M.D.   On: 02/19/2018 15:26   US Abdomen Limited Ruq  Result Date: 02/19/2018 CLINICAL DATA:  Initial evaluation for elevated LFTs. EXAM: ULTRASOUND ABDOMEN LIMITED RIGHT UPPER QUADRANT COMPARISON:  Prior ultrasound from 12/02/2016 FINDINGS: Gallbladder: No gallstones or wall thickening visualized. No sonographic Murphy sign noted by sonographer. Common bile duct: Diameter: 2.6 mm Liver: No focal lesion identified. Diffusely increased hepatic echogenicity, suggestive of steatosis. Portal vein is patent on color Doppler imaging with normal direction of blood flow towards the liver. IMPRESSION: 1. No cholelithiasis, evidence for acute cholecystitis, or biliary dilatation. 2. Increased hepatic echogenicity, suggesting steatosis. Electronically Signed   By: Jeannine Boga M.D.   On: 02/19/2018 00:24     Subjective: No issues today.  Discharge Exam: Vitals:   02/22/18 1208 02/22/18 1238  BP:  (!) 157/104  Pulse:  83  Resp:  18  Temp:  (!) 97.4 F (36.3 C)  SpO2: 97% 99%   Vitals:   02/22/18 0803 02/22/18 0927 02/22/18 1208 02/22/18 1238  BP:  (!) 139/98  (!) 157/104  Pulse:  97  83  Resp:  18  18  Temp:    (!) 97.4 F (36.3 C)  TempSrc:    Oral  SpO2: 97% 91% 97% 99%  Weight:      Height:        General: Pt is alert, awake, not in acute distress Cardiovascular: RRR, S1/S2 +, no rubs, no  gallops Respiratory: Upper airway transmission of wheezes which are very prominent in neck Abdominal: Soft, NT, ND, bowel sounds + Extremities: no edema, no cyanosis Psych: depressed mood    The results of significant diagnostics from this hospitalization (including imaging, microbiology, ancillary and laboratory) are listed below for reference.     Microbiology: Recent Results (from the past 240 hour(s))  Wet prep, genital     Status: Abnormal   Collection Time: 02/18/18  9:14 PM  Result Value Ref Range Status   Yeast Wet Prep HPF POC NONE SEEN NONE SEEN Final   Trich, Wet Prep NONE SEEN NONE SEEN Final   Clue Cells Wet Prep HPF POC NONE SEEN NONE SEEN Final   WBC, Wet Prep HPF POC MODERATE (A) NONE SEEN Final  Sperm NONE SEEN  Final    Comment: Performed at [facility], [place] [address] [place], [place] [address]  Urine culture     Status: Abnormal   Collection Time: 02/18/18 11:57 PM  Result Value Ref Range Status   Specimen Description   Final    URINE, CLEAN CATCH Performed at [facility], [place] [address] [place], [place] [address]    Special Requests   Final    NONE Performed at [facility], [place] [address] [place], [place] [address]    Culture >=100,000 COLONIES/mL ESCHERICHIA COLI (A)  Final   Report Status 02/21/2018 FINAL  Final   Organism ID, Bacteria ESCHERICHIA COLI (A)  Final      Susceptibility   Escherichia coli - MIC*    AMPICILLIN 8 SENSITIVE Sensitive     CEFAZOLIN <=4 SENSITIVE Sensitive     CEFTRIAXONE <=1 SENSITIVE Sensitive     CIPROFLOXACIN <=0.25 SENSITIVE Sensitive     GENTAMICIN <=1 SENSITIVE Sensitive     IMIPENEM <=0.25 SENSITIVE Sensitive     NITROFURANTOIN 64 INTERMEDIATE Intermediate     TRIMETH/SULFA <=20 SENSITIVE Sensitive     AMPICILLIN/SULBACTAM 4 SENSITIVE Sensitive     PIP/TAZO <=4 SENSITIVE Sensitive     Extended ESBL NEGATIVE Sensitive     * >=100,000 COLONIES/mL  ESCHERICHIA COLI  Culture, blood (routine x 2) Call MD if unable to obtain prior to antibiotics being given     Status: None (Preliminary result)   Collection Time: 02/19/18  2:42 AM  Result Value Ref Range Status   Specimen Description   Final    BLOOD LEFT WRIST Performed at [facility], [address], [place], [place] [address]    Special Requests   Final    BOTTLES DRAWN AEROBIC ONLY Blood Culture results may not be optimal due to an inadequate volume of blood received in culture bottles Performed at [place] [address], [place], [place] [address]    Culture   Final    NO GROWTH 3 DAYS Performed at [facility], [place] [address] [place], [place] [address]    Report Status PENDING  Incomplete  Culture, blood (routine x 2) Call MD if unable to obtain prior to antibiotics being given     Status: None (Preliminary result)   Collection Time: 02/19/18  2:42 AM  Result Value Ref Range Status   Specimen Description   Final    BLOOD LEFT Performed at [place] [address], [place], [place] [address]    Special Requests   Final    BOTTLES DRAWN AEROBIC ONLY Blood Culture results may not be optimal due to an inadequate volume of blood received in culture bottles Performed at [place] [address], [place], [place] [address]    Culture   Final    NO GROWTH 3 DAYS Performed at [facility], [place] [address], [place], [place] [address]    Report Status PENDING  Incomplete  Culture, sputum-assessment     Status: None   Collection Time: 02/19/18  4:29 AM  Result Value Ref Range Status   Specimen Description SPUTUM  Final   Special Requests NONE  Final   Sputum evaluation   Final    Sputum specimen not acceptable for testing.  Please recollect.   RESULT CALLED TO, READ BACK BY AND VERIFIED WITH: MICHELLE RN AT 0272 02/20/18 BY TIBBITTS,K Performed at  [facility], [place]  [address], [place], [place] [address]    Report Status 02/20/2018 FINAL  Final     Labs: BNP (last 3 results) Recent Labs    08/30/17 1625 02/18/18 2114  BNP 145.7* 761.6*   Basic Metabolic Panel: Recent Labs  Lab 02/18/18 2114 02/19/18 0242 02/20/18 0804 02/21/18 0533 02/22/18 0526  NA 142 135 139 142 143  K 3.6 3.3* 3.8 3.8 3.9  CL 99 95* 102 107 109  CO2 25 27 27 26 24   GLUCOSE 72 90 167* 133* 91  BUN 5* 5* 8 10 11   CREATININE 0.78 0.70 0.82 0.77 0.78  CALCIUM 8.6* 7.6* 7.4* 7.3* 7.4*   Liver Function Tests: Recent Labs  Lab 02/18/18 2114 02/19/18 0242 02/20/18 0804 02/21/18 0533 02/22/18 0526  AST 282* 218* 176* 163* 219*  ALT 78* 64* 61* 61* 78*  ALKPHOS 256* 207* 203* 180* 163*  BILITOT 1.5* 1.3* 1.1 1.0 1.2  PROT 6.2* 5.4* 5.6* 5.6* 5.5*  ALBUMIN 2.6* 2.2* 2.3* 2.4* 2.4*   No results for input(s): LIPASE, AMYLASE in the last 168 hours. No results for input(s): AMMONIA in the last 168 hours. CBC: Recent Labs  Lab 02/19/18 0242 02/19/18 1106 02/20/18 0804 02/21/18 0533 02/22/18 0526  WBC 7.4 5.1 11.5* 11.3* 10.2  NEUTROABS 6.3  --  10.0*  --   --   HGB 9.8* 9.6* 9.3* 8.8* 9.2*  HCT 27.8* 27.0* 26.8* 25.7* 27.3*  MCV 104.1* 105.1* 106.3* 108.0* 109.6*  PLT 266 284 320 312 313   Cardiac Enzymes: No results for input(s): CKTOTAL, CKMB, CKMBINDEX, TROPONINI in the last 168 hours. BNP: Invalid input(s): POCBNP CBG: No results for input(s): GLUCAP in the last 168 hours. D-Dimer No results for input(s): DDIMER in the last 72 hours. Hgb A1c No results for input(s): HGBA1C in the last 72 hours. Lipid Profile No results for input(s): CHOL, HDL, LDLCALC, TRIG, CHOLHDL, LDLDIRECT in the last 72 hours. Thyroid function studies No results for input(s): TSH, T4TOTAL, T3FREE, THYROIDAB in the last 72 hours.  Invalid input(s): FREET3 Anemia work up No results for input(s): VITAMINB12, FOLATE, FERRITIN, TIBC, IRON, RETICCTPCT in the last 72  hours. Urinalysis    Component Value Date/Time   COLORURINE AMBER (A) 02/18/2018 2357   APPEARANCEUR CLOUDY (A) 02/18/2018 2357   LABSPEC 1.005 02/18/2018 2357   PHURINE 6.0 02/18/2018 2357   GLUCOSEU NEGATIVE 02/18/2018 2357   HGBUR LARGE (A) 02/18/2018 2357   BILIRUBINUR NEGATIVE 02/18/2018 2357   KETONESUR NEGATIVE 02/18/2018 2357   PROTEINUR NEGATIVE 02/18/2018 2357   UROBILINOGEN 1.0 05/22/2012 2229   NITRITE POSITIVE (A) 02/18/2018 2357   LEUKOCYTESUR LARGE (A) 02/18/2018 2357   Sepsis Labs Invalid input(s): PROCALCITONIN,  WBC,  LACTICIDVEN Microbiology Recent Results (from the past 240 hour(s))  Wet prep, genital     Status: Abnormal   Collection Time: 02/18/18  9:14 PM  Result Value Ref Range Status   Yeast Wet Prep HPF POC NONE SEEN NONE SEEN Final   Trich, Wet Prep NONE SEEN NONE SEEN Final   Clue Cells Wet Prep HPF POC NONE SEEN NONE SEEN Final   WBC, Wet Prep HPF POC MODERATE (A) NONE SEEN Final   Sperm NONE SEEN  Final    Comment: Performed at [facility], [place] [address] [place], [place] [address]  Urine culture     Status: Abnormal   Collection Time: 02/18/18 11:57 PM  Result Value Ref Range Status   Specimen Description  Final    URINE, CLEAN CATCH Performed at [facility], [place] [address], [place], [place] [address]    Special Requests   Final    NONE Performed at [facility], [place] [address], [place], [place] [address]    Culture >=100,000 COLONIES/mL ESCHERICHIA COLI (A)  Final   Report Status 02/21/2018 FINAL  Final   Organism ID, Bacteria ESCHERICHIA COLI (A)  Final      Susceptibility   Escherichia coli - MIC*    AMPICILLIN 8 SENSITIVE Sensitive     CEFAZOLIN <=4 SENSITIVE Sensitive     CEFTRIAXONE <=1 SENSITIVE Sensitive     CIPROFLOXACIN <=0.25 SENSITIVE Sensitive     GENTAMICIN <=1 SENSITIVE Sensitive     IMIPENEM <=0.25 SENSITIVE Sensitive     NITROFURANTOIN 64 INTERMEDIATE  Intermediate     TRIMETH/SULFA <=20 SENSITIVE Sensitive     AMPICILLIN/SULBACTAM 4 SENSITIVE Sensitive     PIP/TAZO <=4 SENSITIVE Sensitive     Extended ESBL NEGATIVE Sensitive     * >=100,000 COLONIES/mL ESCHERICHIA COLI  Culture, blood (routine x 2) Call MD if unable to obtain prior to antibiotics being given     Status: None (Preliminary result)   Collection Time: 02/19/18  2:42 AM  Result Value Ref Range Status   Specimen Description   Final    BLOOD LEFT WRIST Performed at [facility], [address] [place], [place] [address]    Special Requests   Final    BOTTLES DRAWN AEROBIC ONLY Blood Culture results may not be optimal due to an inadequate volume of blood received in culture bottles Performed at [place] [address] [place], [place] [address]    Culture   Final    NO GROWTH 3 DAYS Performed at [facility], [place] [address] [place], [place] [address]    Report Status PENDING  Incomplete  Culture, blood (routine x 2) Call MD if unable to obtain prior to antibiotics being given     Status: None (Preliminary result)   Collection Time: 02/19/18  2:42 AM  Result Value Ref Range Status   Specimen Description   Final    BLOOD LEFT Performed at [place] [address] [place], [place] [address]    Special Requests   Final    BOTTLES DRAWN AEROBIC ONLY Blood Culture results may not be optimal due to an inadequate volume of blood received in culture bottles Performed at [place] [address], [place], [place] [address]    Culture   Final    NO GROWTH 3 DAYS Performed at [facility], [place] [address], [place], [place] [address]    Report Status PENDING  Incomplete  Culture, sputum-assessment     Status: None   Collection Time: 02/19/18  4:29 AM  Result Value Ref Range Status   Specimen Description SPUTUM  Final   Special Requests NONE  Final   Sputum evaluation   Final    Sputum  specimen not acceptable for testing.  Please recollect.   RESULT CALLED TO, READ BACK BY AND VERIFIED WITH: MICHELLE RN AT 2585 02/20/18 BY TIBBITTS,K Performed at [place] [address] [place], [place] [address]    Report Status 02/20/2018 FINAL  Final     SIGNED:   Cordelia Poche, MD [facility] 02/22/2018, 2:28 PM

## 2018-02-22 NOTE — Progress Notes (Signed)
CSW followed up with patient at bedside regarding psychiatrist recommendations. Patient reported that she is watching tv and requested that CSW be quick. CSW informed patient the the psychiatrist recommends that patient follow up with a local therapist, CSW provided patient with local mental health resources. CSW informed patient that the psychiatrist also recommended that patient follow up with senior day programs, CSW provided patient with [place] [facility]. CSW asked patient if she had any questions, patient reported none. CSW signing off, no other needs identified at this time.  Abundio Miu, [place] Social Worker [facility] Cell#: [phone]

## 2018-02-22 NOTE — Care Management Important Message (Signed)
Important Message  Patient Details  Name: Jenna Marquez MRN: [id number] Date of Birth: August 30, 1945   Medicare Important Message Given:  Yes    Kerin Salen 02/22/2018, 10:57 AMImportant Message  Patient Details  Name: Jenna Marquez MRN: [id number] Date of Birth: 05-01-1946   Medicare Important Message Given:  Yes    Kerin Salen 02/22/2018, 10:57 AM

## 2018-02-23 ENCOUNTER — Encounter: Payer: Self-pay | Admitting: *Deleted

## 2018-02-23 ENCOUNTER — Telehealth: Payer: Self-pay

## 2018-02-23 ENCOUNTER — Other Ambulatory Visit: Payer: Self-pay | Admitting: *Deleted

## 2018-02-23 NOTE — Patient Outreach (Signed)
[facility]) Care Management  02/23/2018  Jenna Marquez 17-Nov-1945 [id number]   CSW was able to make initial contact with patient today to perform phone assessment, as well as assess and assist with social work needs and services.  CSW introduced self, explained role and types of services provided through [facility] ([facility]).  CSW further explained to patient that [facility] works with the [facility], also with [facility], Marthenia Rolling. CSW then explained the reason for the call, indicating that Jenna Marquez thought that patient would benefit from social work services and resources to assist with counseling and supportive services to help cope with symptoms of depression.  CSW obtained two HIPAA compliant identifiers from patient, which included patient's name and date of birth.  Patient admits that she is experiencing a great deal of pain today, becoming tearful with CSW over the phone.  CSW offered counseling and supportive services, where appropriate.  CSW also encouraged patient to communicate her symptoms to her Primary Care Physician, Jenna Marquez, if the pain becomes unbearable.  Patient indicated that she is waiting for her niece to arrive to assist her with taking her pain medications so that hopefully she can get some relief.  Patient also indicated that she is waiting for home health to arrive so the home health nurse can assist her with wound care and dressing changes.  CSW inquired as to whether or not CSW needed to make some calls for patient, as patient indicated that she was sitting in her depends waiting for her nephew to come inside the house to assist her to the bathroom.  Patient denied, reporting that she will simply wet herself if someone did not arrive shortly.    Patient reported that she would like to speak with CSW about her depression, as well as receive a list of providers that accept [employer]  Medicare.  CSW agreed to schedule an initial home visit with patient to provide counseling and supportive services, as well as to teach patient deep breathing exercises and relaxation techniques.  CSW will also mail patient EMMI information regarding "Signs and Symptoms of Depression", as well as a list of [employer] Medicare therapists/counselors.  The initial home visit with patient has been scheduled for Tuesday, February 27, 2018 at 11:00AM.  Patient has CSW's contact information and has been encouraged to contact CSW if anything changes or if additional social work needs arise in the meantime.  Patient voiced understanding and was agreeable to this plan.    [facility] CM Care Plan Problem One     Most Recent Value  Care Plan Problem One  Patient is experiencing signs and symptoms of depression.  Role Documenting the Problem One  Clinical Social Worker  Care Plan for Problem One  Active  [facility] Long Term Goal   Patient will have a reduction in symptoms of depression through counseling and supportive services, within the next 45 days.  [facility] Long Term Goal Start Date  02/23/18  Interventions for Problem One Long Term Goal  CSW will provide counseling and supportive services to patient to help eliminate symptoms of depression.  [facility] CM Short Term Goal #1   Patient will review literature provided to her by CSW on Signs and Symptoms of Depression to better educate herself on her illness, within the next 30 days.  [facility] CM Short Term Goal #1 Start Date  02/23/18  Interventions for Short Term Goal #1  CSW will provide patient  with EMMI information on Signs and Symptoms of Depression for her review.  [facility] CM Short Term Goal #2   Patient will practice deep breathing exercises and relaxation techniques to help cope with symptoms of depression, within the next 30 days.  [facility] CM Short Term Goal #2 Start Date  02/23/18  Interventions for Short Term Goal #2  CSW will teach patient deep breathing exercises and relaxation  techniques to try and relieve symptoms of depression.  [facility] CM Short Term Goal #3  Patient will decide on a counselor/therapist of choice to provide counseling services, within the next 30 days.  [facility] CM Short Term Goal #3 Start Date  02/23/18  Interventions for Short Tern Goal #3  CSW will provide patient with a list of counselors/therapists that accept [employer] Medicare.  [facility] CM Short Term Goal #4  Patient will meet with CSW for an initial home visit so that CSW can provide counseling and supportive services to patient.  [facility] CM Short Term Goal #4 Start Date  02/23/18  Interventions for Short Term Goal #4  CSW's initial home visit with patient has been scheduled for Tuesday, February 27, 2018 at 11:00AM.     Nat Christen, BSW, MSW, LCSW  Licensed Clinical Social Worker  [place]  [address] [place] [address] [place], [place] [address] Physical Address-[address], [place], [place] [address] Toll Free Main # [phone] Fax # [phone] Cell # [phone]  Office # [phone] [email or web address]

## 2018-02-23 NOTE — Telephone Encounter (Signed)
Called patient to schedule hospital follow up appointment. Patient crying on the phone states she needs someone at her home to help her with all her ADL's. Advised I would speak to her PCP regarding a Home Health referral. Please advise

## 2018-02-23 NOTE — Telephone Encounter (Signed)
This is your pt. Thx

## 2018-02-23 NOTE — Telephone Encounter (Signed)
02/23/18   Transition Care Management Follow-up Telephone Call  ADMISSION DATE: 02/18/18  DISCHARGE DATE: 02/22/18  Patient needs CBC/BMP drawn.   How have you been since you were released from the hospital? Yes   Do you understand why you were in the hospital? Yes   Do you understand the discharge instrcutions? Yes    Items Reviewed: Medications reviewed: Yes   Allergies reviewed: Yes  Dietary changes reviewed:Low Sodium Heart Healthy. Patient states she is not going by this diet.  Referrals reviewed: Appointment scheduled for follow up   Functional Questionnaire:   Activities of Daily Living (ADLs): Patient states she needs help with all ADL's at this time.   Any patient concerns? Needs home health nurse. Wants to know what caused pneumonia. Concerned about her eczema. Wants Home Health to help her with her ADL's.   Confirmed importance and date/time of follow-up visits scheduled:Yes   Confirmed with patient if condition begins to worsen call PCP or go to the ER. Yes    Patient was given the office number and encouragred to call back with questions or concerns. Yes

## 2018-02-23 NOTE — Patient Outreach (Signed)
[facility]) Care Management  02/23/2018  Jenna Marquez March 18, 1946 [id number]    Referral received 7/12 via D/C date  Initial telephone outreach attempted today however only able to leave a HIPAA approved voice message requesting a call back. Will rescheduled and attempt another call next week for pending case management services.  Raina Mina, RN Care Management Coordinator [place] Office [phone]

## 2018-02-23 NOTE — Patient Outreach (Signed)
[facility]) Care Management  02/23/2018  Adiva Boettner 03-13-1946 [id number]  BSW mailed the patient a welcome packet along with community resources as requested by [facility] [place].  Daneen Schick, BSW, CDP [facility] [phone]

## 2018-02-24 LAB — CULTURE, BLOOD (ROUTINE X 2)
CULTURE: NO GROWTH
Culture: NO GROWTH

## 2018-02-26 DIAGNOSIS — J449 Chronic obstructive pulmonary disease, unspecified: Secondary | ICD-10-CM | POA: Diagnosis not present

## 2018-02-27 ENCOUNTER — Other Ambulatory Visit: Payer: Self-pay | Admitting: *Deleted

## 2018-02-27 NOTE — Telephone Encounter (Signed)
Pt is coming in to be seen this week, will discuss with her then

## 2018-02-27 NOTE — Patient Outreach (Signed)
[facility]) Care Management  02/27/2018  Jenna Marquez 12-Aug-1946 [id number]   CSW was able to meet with patient today to perform the initial home visit to provide counseling and supportive services for symptoms of depression.  Patient admits that she has been dealing with depression since October of 2018, when she lost her drivers license. Patient indicated that her whole life changed when she lost her license because now she is having to rely on other people to get to where she needs to go.  Patient does not like being dependent upon others because she reported that she has always been very independent.  Patient is only able to ambulate with the assistance of a rolling walker and requires continuous oxygen.  Patient had a friend visiting with her at the time of CSW's arrival.  Patient talked a lot about her deceased mother, whom patient was primary caregiver for.  Patient also spoke of her husband that died 8 years ago and how she blames [facility] for his death.  Patient was tearful throughout the conversation, changing subjects often.  Patient indicated that she has experienced a great deal of loss in her life.  Patient went on to say that she had to bury 5 of her loved ones within the last 8 years.  Patient was very talkative throughout the conversation, rarely allowing CSW an opportunity to interject.  However, CSW was able to provide brief counseling and supportive services to patient, as well as Cognitive Behavioral Therapy.  CSW inquired about the list of counselors/therapists that patient was provided while recently hospitalized.  Patient reported that she does not remember being provided a list, nor was there a list with patient's discharge instruction sheet. CSW agreed to provide patient with another list of counselors/therapists, as patient admits that she really needs to talk with someone about her depression.  Patient denied wanting to receive Grief and Loss  Counseling through [facility].  CSW was able to review literature with patient pertaining to Signs & Symptoms of Depression, answering questions often, ensuring that patient knows the signs to look forward when she starts to feel hopeless and helpless.   Patient has a volunteer coming out to see her on the days that her son, Jenna Marquez and daughter-in-law, Jenna Marquez are not available.  The volunteer is able to assist patient with activities of daily living, as well as provide light housekeeping duties, meal preparation, grocery shopping, laundry, etc.  Patient indicated that her Primary Care Physician, Dr. Janett Billow Copland recommended that patient not be left alone for long periods of time, really needing 24 hour care and supervision.  Patient reports that she is unable to hire an in-home caregiver because she cannot afford the added out-of-pocket expense.  Patient also denied wanting placement into an assisted living facility.  CSW agreed to follow-up with patient again next week to perform a routine home visit, at which time, CSW will teach patient deep breathing exercises and relaxation techniques.  CSW will also provide patient with a list of counselors/therapists, assisting patient with making a referral for services.  Patient has been given CSW's contact information and was encouraged to contact CSW directly if patient needs assistance or if additional social work needs arise in the meantime.  Patient voiced understanding and was agreeable to this plan.  [facility] CM Care Plan Problem One     Most Recent Value  Care Plan Problem One  Patient is experiencing signs and symptoms  of depression.  Role Documenting the Problem One  Clinical Social Worker  Care Plan for Problem One  Active  [facility] Long Term Goal   Patient will have a reduction in symptoms of depression through counseling and supportive services, within the next 45 days.  [facility] Long Term Goal Start Date   02/23/18  [facility] CM Short Term Goal #1   Patient will review literature provided to her by CSW on Signs and Symptoms of Depression to better educate herself on her illness, within the next 30 days.  [facility] CM Short Term Goal #1 Start Date  02/23/18  [facility] CM Short Term Goal #1 Met Date  02/20/18  Interventions for Short Term Goal #1  CSW was able to review literature with patient pertaining to Signs & Symptoms of Depression.  [facility] CM Short Term Goal #2   Patient will practice deep breathing exercises and relaxation techniques to help cope with symptoms of depression, within the next 30 days.  [facility] CM Short Term Goal #2 Start Date  02/23/18  [facility] CM Short Term Goal #3  Patient will decide on a counselor/therapist of choice to provide counseling services, within the next 30 days.  [facility] CM Short Term Goal #3 Start Date  02/23/18  [facility] CM Short Term Goal #4  Patient will meet with CSW for an initial home visit so that CSW can provide counseling and supportive services to patient.  [facility] CM Short Term Goal #4 Start Date  02/23/18  [facility] CM Short Term Goal #4 Met Date  02/27/18  Interventions for Short Term Goal #4  CSW's initial home visit with patient has been scheduled for Tuesday, February 27, 2018 at 11:00AM.      Nat Christen, BSW, MSW, LCSW  Licensed Clinical Social Worker  [place]  [address]ailing [place] [address] [place], [place] [address] Physical Address-[address], [place], [place] [address] Toll Free Main # [phone] Fax # [phone] Cell # [phone]  Office # [phone] [email or web address]

## 2018-02-27 NOTE — Telephone Encounter (Signed)
Called patient states she needs help with "basic  everyday needs" such as Showering, Preparing meals, Assistance  to the and in the bathroom.

## 2018-02-27 NOTE — Telephone Encounter (Signed)
Hi Jenna Marquez- thanks for the message.  FYI it got sent to my sister in law Jenna Marquez by mistake!   Can you please give Jenna Marquez a call for me and find out what she would like home health to do for her.  I think she may be looking for someone to help with meals, cleaning, companionship which is not what home health will do.  She may need more of a companion or home aid.  However if she needs help with medications, bathing home health would be a good option.   It looks like [facility] send out a care coordinator to talk with her about options this week  Thank you!

## 2018-02-27 NOTE — Progress Notes (Deleted)
[place] at [facility] [address], [place], [place] [address] [phone]  Date:  03/01/2018   Name:  Jenna Marquez   DOB:  12/29/1945   MRN:  [id number]  PCP:  Darreld Mclean, MD    Chief Complaint: No chief complaint on file.   History of Present Illness:  Jenna Marquez is a 72 y.o. very pleasant female patient who presents with the following:  Following up from hospital stay today-  Admit date: 02/18/2018 Discharge date: 02/22/2018  Recommendations for Outpatient Follow-up:  1. Follow up with PCP in 1 week 2. Outpatient therapy follow-up 3. Please obtain BMP/CBC in one week 4. Follow up CT in 6 weeks with pulmonology 5. Please follow up on the following pending results: Blood culture final result  Home Health: PT Equipment/Devices: Oxygen Chief Complaint:Hemoptysis  HBZ:JIRCVELF Jenna Marquez a 72 y.o.femalewith medical history significant ofalcohol abuse, COPD, paroxysmal atrial fibrillation with pacemaker in place not on any anticoagulation, chronic kidney disease stage III, hypertension as well as GERD who presented to the ER with multiple episodes of hemoptysis at home describes it as a teaspoon size. Associated with cough but no fever or chills. Patient also reported episode of vaginal bleeding. She is not on her. At the moment. She has shortness of breath at rest. She has been drinking alcohol up until yesterday. She also has ongoing tobacco abuse with history of COPD and occasional exacerbation. She has baseline wheezing and shortness of breath use her inhalers. Patient's vaginal bleeding is spotty. She has not had. For almost 20 years. No abdominal pain. No melena no bright red blood per rectum. She denied any significant chest pain. Patient was evaluated in the ER with findings suggestive of pneumonia although pulmonary hemorrhage cannot be excluded. Patient also has dermatitis for which she is on  treatment with steroid. Patient also has severe depression.She reports other nonspecific complaints including generalized body aches.  ED Course:Patient did not have any hemoptysis in the ER, temperature is 97 for blood pressure 167/66 pulse 117 respirate of 22 oxygen sat 100% room air. Her white count is 8.0 with hemoglobin 11.6 platelets 327. Sodium is 142 with potassium 3.6 chloride 99 CO2 25 BUN of 5 and creatinine 0.78 calcium 8.6. INR is 0.97. CT angiogram of the chest showed no PE. Opacification of the central portion of the right lower lobe favoring pneumonia although pulmonary hemorrhage could have a similar appearance. Abdominal ultrasound showed no acute findings except for hepatic steatosis. Alcohol level is 41. Patient started on azithromycin and ceftriaxone and hospitalist called for admission.  Hospital course:  Right lower lobe pneumonia Aspiration is likely diagnosis. Complicated by COPD. Initially started on ceftriaxone/azithromycin but transitioned to Unasyn for aspiration coverage. Augmentin on discharge. Blood cultures no growth to date. Pulmonology follow-up in 6 weeks for repeat CT scan to rule out tumor. Hemoptysis In setting of pneumonia. Resolved.Hemoglobin stable. COPD Significant wheezing on exam. On room air. Question anxiety component. Treated with Duonebs and solu-medrol. Prednisone taper on discharge. Wheezing Upper airway. Recommend ENT follow-up. Depression Appears severe. Patient with passive suicidal ideation. No inpatient behavioral health admission per psychiatry recommendations. Continue outpatient Xanax. Patient declining pharmacotherapy. Social work consulted for outpatient resources for therapy. CKD stage 3 Patient with a baseline creatinine of 0.8-0.9. Currently baseline. Paroxysmal atrial fibrillation Rate controlled. Not on anticoagulation. S/p PPM. Continued metoprolol. Alcohol abuse Per patient, secondary to depression. CIWA scores  elevated but stable. No significant withdrawal symptoms.  UTI E. Coli UTI. Susceptible to penicillin. Augmentin as prescribed above. Vaginal bleeding Multiple uterine myomas. Recommend GYN outpatient follow-up.  Discharge Diagnoses:  Principal Problem:   MDD (major depressive disorder), single episode, severe , no psychosis ([place]) Active Problems:   COPD (chronic obstructive pulmonary disease) gold stage C.   Tobacco abuse   CKD (chronic kidney disease) stage 3, GFR 30-59 ml/min (HCC)   Paroxysmal atrial fibrillation (HCC)   HTN (hypertension)   Hemoptysis   Community acquired pneumonia   Vagina bleeding   Alcohol abuse   Pneumonia   E. coli UTI  It appears that she was also have PMB- will need GYN referral  Patient Active Problem List   Diagnosis Date Noted  . MDD (major depressive disorder), single episode, severe , no psychosis ([place])   . Hemoptysis 02/19/2018  . Community acquired pneumonia 02/19/2018  . Vagina bleeding 02/19/2018  . Alcohol abuse 02/19/2018  . Left knee pain 07/11/2017  . PVD (peripheral vascular disease) ([place]) 04/04/2017  . HTN (hypertension) 11/24/2016  . Paroxysmal atrial fibrillation ([place]) 09/30/2016  . Acute respiratory failure with hypoxia ([place]) 09/30/2016  . Mobitz type 2 second degree AV block 09/30/2016  . Swelling of arm 05/22/2015  . Left shoulder pain 05/22/2015  . CKD (chronic kidney disease) stage 3, GFR 30-59 ml/min (HCC) 05/11/2015  . Pacemaker 04/22/2015  . Generalized anxiety disorder 01/11/2012  . 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   . Tobacco abuse     Past Medical History:  Diagnosis Date  . Allergic rhinitis   . Anxiety    "occasionally"  . Asthma   . COPD (chronic obstructive pulmonary disease) ([place])   . GERD (gastroesophageal reflux disease)   . Mobitz type 2 second degree AV block 01/06/2015    Past Surgical History:  Procedure Laterality Date  . [place]  . EP  IMPLANTABLE DEVICE N/A 01/07/2015   Procedure: Pacemaker Implant;  Surgeon: Evans Lance, MD;  Location: [place] [facility];  Service: Cardiovascular;  Laterality: N/A;  . PACEMAKER INSERTION    . TONSILLECTOMY AND ADENOIDECTOMY  1954    Social History   Tobacco Use  . Smoking status: Former Smoker    Packs/day: 1.00    Years: 35.00    Pack years: 35.00    Types: Cigarettes    Last attempt to quit: 08/10/2012    Years since quitting: 5.5  . Smokeless tobacco: Never Used  . Tobacco comment: Started at age 84  Substance Use Topics  . Alcohol use: Yes    Alcohol/week: 16.8 oz    Types: 28 Shots of liquor per week    Comment: 01/06/2015 "2 shot mixed drinks, 2/day"  . Drug use: No    Family History  Problem Relation Age of Onset  . Coronary artery disease Mother   . Heart disease Mother        CHF  . Diabetes Neg Hx   . Cancer Neg Hx   . Stroke Neg Hx   . Colon cancer Neg Hx     Allergies  Allergen Reactions  . Avelox [Moxifloxacin Hcl In Nacl] Itching and Rash    Medication list has been reviewed and updated.  Current Outpatient Medications on File Prior to Visit  Medication Sig Dispense Refill  . albuterol (PROAIR HFA) 108 (90 Base) MCG/ACT inhaler Inhale 1-2 puffs into the lungs every 6 (six) hours as needed for wheezing or shortness of breath. 1 Inhaler 3  .  albuterol (PROVENTIL HFA;VENTOLIN HFA) 108 (90 Base) MCG/ACT inhaler Inhale 2 puffs into the lungs every 6 (six) hours as needed for wheezing or shortness of breath. (Patient not taking: Reported on 02/18/2018) 1 Inhaler 2  . albuterol (PROVENTIL) (2.5 MG/3ML) 0.083% nebulizer solution Take 3 mLs (2.5 mg total) by nebulization every 6 (six) hours as needed for wheezing or shortness of breath. 150 mL 0  . ALPRAZolam (XANAX) 0.5 MG tablet Take 1 tablet (0.5 mg total) by mouth every 8 (eight) hours as needed for anxiety (**DO NOT TAKE AT NIGHT WITH HYDROXYZINE**). Do not take with alcohol 15 tablet 0  . aspirin EC 81  MG EC tablet Take 1 tablet (81 mg total) by mouth daily. 30 tablet 0  . benzonatate (TESSALON) 200 MG capsule Take 1 capsule (200 mg total) by mouth 3 (three) times daily as needed for cough. 30 capsule 1  . budesonide-formoterol (SYMBICORT) 160-4.5 MCG/ACT inhaler Inhale 2 puffs into the lungs 2 (two) times daily. 3 Inhaler 1  . calcium-vitamin D (OSCAL WITH D) 250-125 MG-UNIT tablet Take 1 tablet by mouth daily.    . Cholecalciferol (VITAMIN D) 2000 UNITS CAPS Take 1 capsule by mouth daily.    . hydrochlorothiazide (HYDRODIURIL) 25 MG tablet TAKE 1/2 OR 1 DAILY AS NEEDED FOR SWELLING OF LEGS (Patient not taking: Reported on 02/18/2018) 30 tablet 2  . hydrOXYzine (ATARAX/VISTARIL) 10 MG tablet TAKE 2 TABLETS BY MOUTH AS NEEDED FOR ITCHING. DO NOT TAKE WITH ALPRAZOLAM 60 tablet 0  . ipratropium (ATROVENT) 0.02 % nebulizer solution Take 2.5 mLs (0.5 mg total) by nebulization every 6 (six) hours as needed for wheezing or shortness of breath. 75 mL 5  . metoprolol tartrate (LOPRESSOR) 25 MG tablet Take 1 tablet (25 mg total) by mouth 2 (two) times daily. 180 tablet 2  . Multiple Vitamin (MULTIVITAMIN) tablet Take 2 tablets by mouth every morning.     . nicotine (NICODERM CQ - DOSED IN MG/24 HOURS) 21 mg/24hr patch Place 1 patch (21 mg total) onto the skin daily. 28 patch 0  . predniSONE (DELTASONE) 20 MG tablet Take 60mg  x 2 days, 40mg  x 2 days, 20mg  x 3 days 13 tablet 0  . triamcinolone ointment (KENALOG) 0.1 % APPLY TO AFFECTED AREA TWICE A DAY  0   No current facility-administered medications on file prior to visit.     Review of Systems:  As per HPI- otherwise negative.   Physical Examination: There were no vitals filed for this visit. There were no vitals filed for this visit. There is no height or weight on file to calculate BMI. Ideal Body Weight:    GEN: WDWN, NAD, Non-toxic, A & O x 3 HEENT: Atraumatic, Normocephalic. Neck supple. No masses, No LAD. Ears and Nose: No external  deformity. CV: RRR, No M/G/R. No JVD. No thrill. No extra heart sounds. PULM: CTA B, no wheezes, crackles, rhonchi. No retractions. No resp. distress. No accessory muscle use. ABD: S, NT, ND, +BS. No rebound. No HSM. EXTR: No c/c/e NEURO Normal gait.  PSYCH: Normally interactive. Conversant. Not depressed or anxious appearing.  Calm demeanor.    Assessment and Plan: ***  Signed Lamar Blinks, MD

## 2018-02-28 ENCOUNTER — Other Ambulatory Visit: Payer: Self-pay | Admitting: *Deleted

## 2018-02-28 DIAGNOSIS — I2510 Atherosclerotic heart disease of native coronary artery without angina pectoris: Secondary | ICD-10-CM | POA: Diagnosis not present

## 2018-02-28 DIAGNOSIS — I700 Atherosclerosis of aorta: Secondary | ICD-10-CM | POA: Diagnosis not present

## 2018-02-28 DIAGNOSIS — J439 Emphysema, unspecified: Secondary | ICD-10-CM | POA: Diagnosis not present

## 2018-02-28 DIAGNOSIS — K760 Fatty (change of) liver, not elsewhere classified: Secondary | ICD-10-CM | POA: Diagnosis not present

## 2018-02-28 DIAGNOSIS — Z7952 Long term (current) use of systemic steroids: Secondary | ICD-10-CM | POA: Diagnosis not present

## 2018-02-28 DIAGNOSIS — N183 Chronic kidney disease, stage 3 (moderate): Secondary | ICD-10-CM | POA: Diagnosis not present

## 2018-02-28 DIAGNOSIS — Z9981 Dependence on supplemental oxygen: Secondary | ICD-10-CM | POA: Diagnosis not present

## 2018-02-28 DIAGNOSIS — K219 Gastro-esophageal reflux disease without esophagitis: Secondary | ICD-10-CM | POA: Diagnosis not present

## 2018-02-28 DIAGNOSIS — J40 Bronchitis, not specified as acute or chronic: Secondary | ICD-10-CM | POA: Diagnosis not present

## 2018-02-28 DIAGNOSIS — R45851 Suicidal ideations: Secondary | ICD-10-CM | POA: Diagnosis not present

## 2018-02-28 DIAGNOSIS — F322 Major depressive disorder, single episode, severe without psychotic features: Secondary | ICD-10-CM | POA: Diagnosis not present

## 2018-02-28 DIAGNOSIS — F1010 Alcohol abuse, uncomplicated: Secondary | ICD-10-CM | POA: Diagnosis not present

## 2018-02-28 DIAGNOSIS — I480 Paroxysmal atrial fibrillation: Secondary | ICD-10-CM | POA: Diagnosis not present

## 2018-02-28 DIAGNOSIS — Z950 Presence of cardiac pacemaker: Secondary | ICD-10-CM | POA: Diagnosis not present

## 2018-02-28 DIAGNOSIS — Z87891 Personal history of nicotine dependence: Secondary | ICD-10-CM | POA: Diagnosis not present

## 2018-02-28 DIAGNOSIS — I129 Hypertensive chronic kidney disease with stage 1 through stage 4 chronic kidney disease, or unspecified chronic kidney disease: Secondary | ICD-10-CM | POA: Diagnosis not present

## 2018-02-28 DIAGNOSIS — Z7951 Long term (current) use of inhaled steroids: Secondary | ICD-10-CM | POA: Diagnosis not present

## 2018-02-28 NOTE — Patient Outreach (Signed)
[facility]) Care Management  02/28/2018  Jenna Marquez 05-03-1946 [id number]    Telephone Assessment  RN spoke with pt and introduced the [facility] services and the purpose for today's call (pt receptive). RN explained [facility] services as pt indicated she needs someone to help prepare meals and assist in the home. States she currently has a Psychologist, occupational but needs additional assistance because she can't "fix" her meals. RN attempt to discuss available community resources and inquired about the involved agency reported for wound care on the initial referral. Pt hesitate to verify [facility] was involved for wound care but eventually indicated [facility] is involved with her for a sacra wound. RN attempted to discuss available resources however unaware if pt qualifies for such services with no charge. Pt inquired on [facility] services as RN explained there is no charge for any services provided by [name]. RN further inquired on pt's medical issues related to COPD(pneumonia)/Aftrial Fibrillation. Pt states she has had COPD for 25 yrs and asthma as a child. Pt states she is able to manage all her conditions and aware of all her medications with no nursing needs at this time.  Pt indicated she had a lot going on and opt to decline the services offered today including HV or telephonic services for any nursing or disease management needs at this time. RN has alerted pt that her primary provider will be notified of her decline for services. RN will also update the involved Education officer, museum of pt's disposition with this RN case management.   Raina Mina, RN Care Management Coordinator [place] Office [phone]

## 2018-03-01 ENCOUNTER — Inpatient Hospital Stay: Payer: PPO | Admitting: Family Medicine

## 2018-03-01 ENCOUNTER — Telehealth: Payer: Self-pay | Admitting: Family Medicine

## 2018-03-01 NOTE — Telephone Encounter (Unsigned)
Copied from [place] [id number]. Topic: General - Other >> Mar 01, 2018  9:09 AM Jenna Marquez wrote: Reason for CRM:  [place] care Verbal order: continue  two times a week for five weeks And add Opt therapy to address dressing  and activity of daily living A home health aid for bathing assistance  A skill nurse for a wound on her  butt  area.    Jenna Marquez contact number is [phone]

## 2018-03-01 NOTE — Telephone Encounter (Signed)
Verbal orders given via voice-mail.

## 2018-03-02 ENCOUNTER — Telehealth: Payer: Self-pay | Admitting: *Deleted

## 2018-03-02 NOTE — Telephone Encounter (Signed)
Received Physician Orders/CMN Oxygen from [facility]; forwarded to provider/SLS 07/19

## 2018-03-04 NOTE — Progress Notes (Signed)
[place] at [facility] [address] [place], [place] [address] [phone]  Date:  03/05/2018   Name:  Jenna Marquez   DOB:  [birth year]   MRN:  [id number]  PCP:  Darreld Mclean, MD    Chief Complaint: Hospitalization Follow-up (Pneumonia, right lung)   History of Present Illness:  Jenna Marquez is a 72 y.o. very pleasant female patient who presents with the following:  Here today for a hospital follow-up visit  Admit date: 02/18/2018 Discharge date: 02/22/2018  Admitted From: Home Disposition: Home  Recommendations for Outpatient Follow-up:  1. Follow up with PCP in 1 week 2. Outpatient therapy follow-up 3. Please obtain BMP/CBC in one week 4. Follow up CT in 6 weeks with pulmonology 5. Please follow up on the following pending results: Blood culture final result  Home Health: PT Equipment/Devices: Oxygen  Chief Complaint:Hemoptysis  Jenna Marquez like someone to help with meals, housework, etc.  It sounds like she could use both HH and a home aid.  Discussed with pt and her nephew.  I will place an order for [facility].  She is already getting PT at home.  Marquez also suggest that they look into a home aid agency such as [facility] for other assistance at home   Patient Active Problem List   Diagnosis Date Noted  . MDD (major depressive disorder), single episode, severe , no psychosis ([place])   . Hemoptysis 02/19/2018  . Community acquired pneumonia 02/19/2018  . Vagina bleeding 02/19/2018  . Alcohol abuse 02/19/2018  . Left knee pain 07/11/2017  . PVD (peripheral vascular disease) ([place]) 04/04/2017  . HTN (hypertension) 11/24/2016  . Paroxysmal atrial fibrillation ([place]) 09/30/2016  . Acute respiratory failure with hypoxia ([place]) 09/30/2016  . Mobitz type 2 second degree AV block 09/30/2016  . Swelling of arm 05/22/2015  . Left shoulder pain 05/22/2015  . CKD (chronic kidney disease) stage 3, GFR 30-59 ml/min (HCC) 05/11/2015  . Pacemaker 04/22/2015  .  Generalized anxiety disorder 01/11/2012  . 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   . Tobacco abuse     Past Medical History:  Diagnosis Date  . Allergic rhinitis   . Anxiety    "occasionally"  . Asthma   . COPD (chronic obstructive pulmonary disease) ([place])   . GERD (gastroesophageal reflux disease)   . Mobitz type 2 second degree AV block 01/06/2015    Past Surgical History:  Procedure Laterality Date  . [place]  . EP IMPLANTABLE DEVICE N/A 01/07/2015   Procedure: Pacemaker Implant;  Surgeon: Evans Lance, MD;  Location: [place] [facility];  Service: Cardiovascular;  Laterality: N/A;   . PACEMAKER INSERTION    . TONSILLECTOMY AND ADENOIDECTOMY  1954    Social History   Tobacco Use  . Smoking status: Former Smoker    Packs/day: 1.00    Years: 35.00    Pack years: 35.00    Types: Cigarettes    Last attempt to quit: 08/10/2012    Years since quitting: 5.5  . Smokeless tobacco: Never Used  . Tobacco comment: Started at age 41  Substance Use Topics  . Alcohol use: Yes    Alcohol/week: 16.8 oz    Types: 28 Shots of liquor per week    Comment: 01/06/2015 "2 shot mixed drinks, 2/day"  . Drug use: No    Family History  Problem Relation Age of Onset  . Coronary artery disease Mother   . Heart disease Mother        CHF  . Diabetes Neg Hx   . Cancer Neg Hx   . Stroke Neg Hx   . Colon cancer Neg Hx     Allergies  Allergen Reactions  . Avelox [Moxifloxacin Hcl In Nacl] Itching and Rash    Medication list has been reviewed and updated.  Current Outpatient Medications on File Prior to Visit  Medication Sig Dispense Refill  . albuterol (PROAIR HFA) 108 (90 Base) MCG/ACT inhaler Inhale 1-2 puffs into the lungs every 6 (six) hours as needed for wheezing or shortness of breath. 1 Inhaler 3  . ALPRAZolam (XANAX) 0.5 MG tablet Take 1 tablet (0.5 mg total) by mouth every 8 (eight) hours as needed for anxiety (**DO NOT TAKE AT NIGHT WITH HYDROXYZINE**). Do not take with alcohol 15 tablet 0  . aspirin EC 81 MG EC tablet Take 1 tablet (81 mg total) by mouth daily. 30 tablet 0  . budesonide-formoterol (SYMBICORT) 160-4.5 MCG/ACT inhaler Inhale 2 puffs into the lungs 2 (two) times daily. 3 Inhaler 1  . calcium-vitamin D (OSCAL WITH D) 250-125 MG-UNIT tablet Take 1 tablet by mouth daily.    . Cholecalciferol (VITAMIN D) 2000 UNITS CAPS Take 1 capsule by mouth daily.    . hydrochlorothiazide (HYDRODIURIL) 25 MG tablet TAKE 1/2 OR 1 DAILY AS NEEDED FOR SWELLING OF LEGS 30 tablet 2  . hydrOXYzine (ATARAX/VISTARIL) 10 MG tablet TAKE 2 TABLETS BY MOUTH AS NEEDED FOR ITCHING. DO  NOT TAKE WITH ALPRAZOLAM 60 tablet 0  . ipratropium (ATROVENT) 0.02 % nebulizer solution Take 2.5 mLs (0.5 mg total) by nebulization every 6 (six) hours as needed for wheezing or shortness of breath. 75 mL 5  . Multiple Vitamin (MULTIVITAMIN) tablet Take 2 tablets by mouth every morning.     . triamcinolone ointment (KENALOG) 0.1 % APPLY TO AFFECTED AREA TWICE A DAY  0  . metoprolol tartrate (LOPRESSOR) 25 MG tablet Take 1 tablet (25 mg total)  by mouth 2 (two) times daily. 180 tablet 2   No current facility-administered medications on file prior to visit.     Review of Systems:  As per HPI- otherwise negative.   Physical Examination: Vitals:   03/05/18 1558  BP: 118/60  Pulse: (!) 112  Resp: 16  SpO2: 100%   Vitals:   There is no height or weight on file to calculate BMI. Ideal Body Weight:    GEN: WDWN, NAD, Non-toxic, A & O x 3, sitting in WC Oxygen via [place] at 2L HEENT: Atraumatic, Normocephalic. Neck supple. No masses, No LAD. Ears and Nose: No external deformity. CV: RRR, No M/G/R. No JVD. No thrill. No extra heart sounds. PULM: CTA B, no wheezes, crackles, rhonchi. No retractions. No resp. distress. No accessory muscle use. ABD: S, NT, ND, +BS. No rebound. No HSM. EXTR: No c/c/e NEURO Normal gait.  PSYCH: Normally interactive. Conversant. Not depressed or anxious appearing.  Calm demeanor.  She notes itching on her body and her feet appear to have an active tinea infection.     BP Readings from Last 3 Encounters:  03/05/18 118/60  02/22/18 (!) 157/104  12/21/17 140/82   Pulse Readings from Last 3 Encounters:  03/05/18 (!) 112  02/22/18 83  12/21/17 92    Assessment and Plan: Hospital discharge follow-up  PMB (postmenopausal bleeding) - Plan: Ambulatory referral to Obstetrics / Gynecology  Urticaria  Community acquired pneumonia, unspecified laterality  Alcohol abuse - Plan: Comprehensive metabolic panel  Transaminitis - Plan: Comprehensive metabolic  panel  Macrocytic anemia - Plan: CBC, Folate, B12  Tinea pedis of both feet - Plan: terbinafine (LAMISIL) 1 % cream  Physical debility - Plan: Ambulatory referral to [place], MD

## 2018-03-05 ENCOUNTER — Encounter

## 2018-03-05 ENCOUNTER — Encounter: Payer: Self-pay | Admitting: Family Medicine

## 2018-03-05 ENCOUNTER — Ambulatory Visit (INDEPENDENT_AMBULATORY_CARE_PROVIDER_SITE_OTHER): Payer: PPO | Admitting: Family Medicine

## 2018-03-05 VITALS — BP 118/60 | HR 96 | Resp 16

## 2018-03-05 DIAGNOSIS — B353 Tinea pedis: Secondary | ICD-10-CM

## 2018-03-05 DIAGNOSIS — D539 Nutritional anemia, unspecified: Secondary | ICD-10-CM

## 2018-03-05 DIAGNOSIS — Z09 Encounter for follow-up examination after completed treatment for conditions other than malignant neoplasm: Secondary | ICD-10-CM

## 2018-03-05 DIAGNOSIS — R740 Nonspecific elevation of levels of transaminase and lactic acid dehydrogenase [LDH]: Secondary | ICD-10-CM

## 2018-03-05 DIAGNOSIS — F1010 Alcohol abuse, uncomplicated: Secondary | ICD-10-CM | POA: Diagnosis not present

## 2018-03-05 DIAGNOSIS — R5381 Other malaise: Secondary | ICD-10-CM

## 2018-03-05 DIAGNOSIS — R7401 Elevation of levels of liver transaminase levels: Secondary | ICD-10-CM

## 2018-03-05 DIAGNOSIS — L509 Urticaria, unspecified: Secondary | ICD-10-CM

## 2018-03-05 DIAGNOSIS — J189 Pneumonia, unspecified organism: Secondary | ICD-10-CM | POA: Diagnosis not present

## 2018-03-05 DIAGNOSIS — N950 Postmenopausal bleeding: Secondary | ICD-10-CM

## 2018-03-05 MED ORDER — TERBINAFINE HCL 1 % EX CREA: 1.0000 "application " | TOPICAL_CREAM | Freq: Two times a day (BID) | CUTANEOUS | 0 refills | Status: DC

## 2018-03-05 NOTE — Patient Instructions (Addendum)
It was nice to see you today- I am sorry that you are having such a hard time.  I do think that quitting drinking would be helpful to you- please try and cut back over the course of 4-6 weeks with the goal of quitting entirely. I think if you are not drinking your body and your mind will be healthier and your itching may also improve!  I am going to refer you to GYN for your bleeding   Please pick up the terbinafine cream I called in to use on your feet- twice a day for 4 weeks. I think this may help with your itching  Take care and please see me in 2-3 months to check on how you are doing

## 2018-03-06 ENCOUNTER — Encounter: Payer: Self-pay | Admitting: Family Medicine

## 2018-03-06 LAB — CBC
HCT: 30.6 % — ABNORMAL LOW (ref 36.0–46.0)
Hemoglobin: 10.4 g/dL — ABNORMAL LOW (ref 12.0–15.0)
MCHC: 34 g/dL (ref 30.0–36.0)
MCV: 109.8 fl — ABNORMAL HIGH (ref 78.0–100.0)
PLATELETS: 349 10*3/uL (ref 150.0–400.0)
RBC: 2.79 Mil/uL — AB (ref 3.87–5.11)
RDW: 14.5 % (ref 11.5–15.5)
WBC: 8.5 10*3/uL (ref 4.0–10.5)

## 2018-03-06 LAB — VITAMIN B12: Vitamin B-12: 564 pg/mL (ref 211–911)

## 2018-03-06 LAB — COMPREHENSIVE METABOLIC PANEL
ALBUMIN: 2.9 g/dL — AB (ref 3.5–5.2)
ALT: 57 U/L — AB (ref 0–35)
AST: 82 U/L — AB (ref 0–37)
Alkaline Phosphatase: 167 U/L — ABNORMAL HIGH (ref 39–117)
BILIRUBIN TOTAL: 1 mg/dL (ref 0.2–1.2)
BUN: 7 mg/dL (ref 6–23)
CHLORIDE: 100 meq/L (ref 96–112)
CO2: 27 meq/L (ref 19–32)
CREATININE: 0.8 mg/dL (ref 0.40–1.20)
Calcium: 8 mg/dL — ABNORMAL LOW (ref 8.4–10.5)
GFR: 90.08 mL/min (ref >60.00)
Glucose, Bld: 85 mg/dL (ref 70–99)
Potassium: 3.8 mEq/L (ref 3.5–5.1)
SODIUM: 142 meq/L (ref 135–145)
Total Protein: 5.6 g/dL — ABNORMAL LOW (ref 6.0–8.3)

## 2018-03-06 LAB — FOLATE: Folate: 7.4 ng/mL (ref >5.9)

## 2018-03-06 NOTE — Telephone Encounter (Signed)
No nursing verbal orders were given only for PT. Please advise

## 2018-03-06 NOTE — Telephone Encounter (Signed)
Verbal orders given  

## 2018-03-07 ENCOUNTER — Encounter: Payer: Self-pay | Admitting: *Deleted

## 2018-03-07 ENCOUNTER — Telehealth: Payer: Self-pay | Admitting: *Deleted

## 2018-03-07 ENCOUNTER — Telehealth: Payer: Self-pay

## 2018-03-07 ENCOUNTER — Other Ambulatory Visit: Payer: Self-pay | Admitting: *Deleted

## 2018-03-07 DIAGNOSIS — R5381 Other malaise: Secondary | ICD-10-CM

## 2018-03-07 NOTE — Telephone Encounter (Signed)
Author phoned [facility] to see if they followed up with pt. Regarding services. Secretary who answered phone confirmed that they received the referral that was authorized by [facility] this AM, and a nurse should reach out to the patient within 24hrs to assess. Author phoned pt. to notify, but pt. would not let author speak, stating "it's a game of 'he said'/ 'she said!' No one has been out here yet! I have not showered in three weeks!". When asked, pt. Stated she already talked to [facility] stated orders needed to be faxed, but this was not communicated to [place], no fax number provided either. Pt. upset, hung up on author. Author phoned [facility] again and requested to speak with Melissa, but Melissa unavailable. Author spoke with Estill Bamberg who answered the phone, and Tilda Burrow stated that it looks like the pt. was requesting a home health aide, which requires a separate order. Home health aide order needs to be faxed to [phone]. OT is scheduled to visit pt. on 7/26 to assist with her bathing. Estill Bamberg stated once she received the order via fax, she would call the patient to notify.

## 2018-03-07 NOTE — Patient Outreach (Signed)
[place] [facility]) Care Management  03/07/2018  Peyson Delao [birth year] [id number]   CSW was able to make contact with patient to remind her of her routine home visit with CSW, scheduled for today at 11:00AM.  Patient reported that she was on the phone holding for a [employer] member to see about receiving more hours of care services in the home and that she would need to cancel her appointment with CSW.  CSW inquired as to whether or not patient wanted to reschedule the appointment, but patient denied.  Patient reported that she is no longer interested in receiving counseling and supportive services through CSW, as she simply has "too much going on at the time".  CSW voiced understanding but then asked patient if she would like for CSW to try and get her established with a counselor/therapist in the community.  Again, patient denied, indicating that she is not interested in any type of counseling services at this time.  CSW inquired as to how CSW could be of further assistance to patient at this time, but by then, patient had already terminated the call.  CSW will perform a case closure on patient, as patient has chosen to [employer] from social work services through [employer].  Patient was unable to identify any additional social work needs at present.  CSW will notify patient's RNCM with [place] [facility], Raina Mina of CSW's plans to close patient's case.  CSW will fax an update to patient's Primary Care Physician, Dr. Janett Billow Copland to ensure that they are aware of CSW's involvement with patient's plan of care.  CSW was able to confirm that patient has the correct contact information for CSW, encouraging patient to contact CSW directly if she changes her mind about receiving services or if additional social work needs arise in the near future.  [facility]N CM Care Plan Problem One     Most Recent Value  Care Plan Problem One   Patient is experiencing signs and symptoms of depression.  Role Documenting the Problem One  Clinical Social Worker  Care Plan for Problem One  Active  [facility] Long Term Goal   Patient will have a reduction in symptoms of depression through counseling and supportive services, within the next 45 days.  [facility]N Long Term Goal Start Date  02/23/18  [facility] CM Short Term Goal #1   Patient will review literature provided to her by CSW on Signs and Symptoms of Depression to better educate herself on her illness, within the next 30 days.  [facility] CM Short Term Goal #1 Start Date  02/23/18  [facility] CM Short Term Goal #1 Met Date  02/20/18  [facility] CM Short Term Goal #2   Patient will practice deep breathing exercises and relaxation techniques to help cope with symptoms of depression, within the next 30 days.  THN CM Short Term Goal #2 Start Date  02/23/18  [facility] CM Short Term Goal #3  Patient will decide on a counselor/therapist of choice to provide counseling services, within the next 30 days.  THN CM Short Term Goal #3 Start Date  02/23/18  [facility] CM Short Term Goal #4  Patient will meet with CSW for an initial home visit so that CSW can provide counseling and supportive services to patient.  THN CM Short Term Goal #4 Start Date  02/23/18  [facility] CM Short Term Goal #4 Met Date  02/27/18  [facility] for Short Term Goal #4  CSW's initial home visit with  patient has been scheduled for Tuesday, February 27, 2018 at 11:00AM.      Nat Christen, BSW, MSW, [place]  Licensed Clinical Social Worker  [place]  Mailing [address] [place], [place] [address] Physical Address-[address], [place], [place] [address] Toll Free Main # [phone] Fax # [phone] Cell # [phone]  Office # [phone] [email or web address]

## 2018-03-07 NOTE — Telephone Encounter (Signed)
Home health aide order entered and [facility]ed to [facility] to [phone]. [name] expressed concern over whether or not an aide itself can be ordered through homehealth, which she explained to the patient during a recent office visit. Nevertheless an order was placed and faxed; awaiting to hear back from [facility].

## 2018-03-07 NOTE — Telephone Encounter (Signed)
Raquel Sarna spoke with me,  I did order home health but did not order a home aid, as far as I knew this is not a medical order. However Raquel Sarna did find an order for "home health home aid" which we printed out and faxed in for her

## 2018-03-07 NOTE — Telephone Encounter (Signed)
Raquel Sarna -- pt requests call from a supervisor regarding below. I did verify that home health referral has been received by our contact, Darlina Guys w/ [facility]. I have sent her a community message requesting that they contact the pt today if possible.  I also saw in the 03/05/18 OV note that Dr copland recommended that she or her son contact [facility] for additional personal care needs. Their # is [phone] if you would like to remind pt of that as well when you speak with her. Best contact # is the one we have on file.  Received call from the [facility] that pt was on the phone requesting to speak with office manager or supervisor. Glass blower/designer not in and clinical supervisor unavailable at the moment. Pt reports to [facility] that she has not been contacted yet about home health referral. Pt was advised that referral was placed on 03/05/18, pt still requesting to speak with supervisor stating insurance told her no referral has been placed yet.

## 2018-03-07 NOTE — Telephone Encounter (Signed)
Please advise 

## 2018-03-08 ENCOUNTER — Telehealth: Payer: Self-pay

## 2018-03-08 NOTE — Telephone Encounter (Signed)
Jenna Marquez with [facility] calling on patient this morning. HR 102-127 back down to 88  BP 110/56 before medication this morning.  O2 98%. Wheezing in left lower lobe, swelling on left hand and bilateral lower extremities. Patient states she is going to take her "water pill" even though she was told to discontinue. There is a wound on Bottom. She has dermatitis on both feet, wheeping, redness, warm to touch. Terbinafine cream not working according to patient.  Spoke with Dr. Etter Sjogren in absence of PCP. Advised nurse to speak with cardiologist about BP medication and if patient should take it. She advised me to send in Doxycycline 100 mg bid [facility] [address]. Patient and Jenna Marquez notified and verbalized understanding. Precious Bard will call cardiologist.  Jenna Marquez # [phone]

## 2018-03-09 ENCOUNTER — Telehealth: Payer: Self-pay | Admitting: *Deleted

## 2018-03-09 DIAGNOSIS — L2089 Other atopic dermatitis: Secondary | ICD-10-CM | POA: Diagnosis not present

## 2018-03-09 DIAGNOSIS — R21 Rash and other nonspecific skin eruption: Secondary | ICD-10-CM | POA: Diagnosis not present

## 2018-03-09 DIAGNOSIS — I2510 Atherosclerotic heart disease of native coronary artery without angina pectoris: Secondary | ICD-10-CM | POA: Diagnosis not present

## 2018-03-09 DIAGNOSIS — K760 Fatty (change of) liver, not elsewhere classified: Secondary | ICD-10-CM | POA: Diagnosis not present

## 2018-03-09 DIAGNOSIS — J40 Bronchitis, not specified as acute or chronic: Secondary | ICD-10-CM | POA: Diagnosis not present

## 2018-03-09 DIAGNOSIS — K219 Gastro-esophageal reflux disease without esophagitis: Secondary | ICD-10-CM | POA: Diagnosis not present

## 2018-03-09 DIAGNOSIS — Z9981 Dependence on supplemental oxygen: Secondary | ICD-10-CM | POA: Diagnosis not present

## 2018-03-09 DIAGNOSIS — F1010 Alcohol abuse, uncomplicated: Secondary | ICD-10-CM | POA: Diagnosis not present

## 2018-03-09 DIAGNOSIS — J439 Emphysema, unspecified: Secondary | ICD-10-CM | POA: Diagnosis not present

## 2018-03-09 NOTE — Telephone Encounter (Signed)
Received Physician Orders from [facility]; forwarded to provider/SLS 07/26  

## 2018-03-09 NOTE — Telephone Encounter (Signed)
Called pt to check on her today- did not reach, will try again

## 2018-03-10 NOTE — Telephone Encounter (Signed)
I called pt to check on her.  She was in to see her dermatologist as an emergent visit last week and her skin is doing much better.  She did not get her doxycycline called in, but reports that her cough is really not a problem and she is feeling much better.  She also has stopped drinking alcohol and is feeling much better overall and is in much better spirits.   Decided to hold off on doxy for now- she is seeing pulmonology in a few days and will let me know if not doing ok in the meantime.  I think it is likely not needed and may have SE. Praised her efforts in stopping drinking and encouraged her to keep it up She is seeing me in September but will let me know if anything needed sooner

## 2018-03-11 ENCOUNTER — Encounter: Payer: Self-pay | Admitting: Family Medicine

## 2018-03-12 ENCOUNTER — Other Ambulatory Visit: Payer: Self-pay | Admitting: *Deleted

## 2018-03-12 NOTE — Patient Outreach (Signed)
[facility]) Care Management  03/12/2018  Tanisia Marquez 03-24-1946 [id number]   Call to pt to follow up on authorization status for home health bath aide. Spoke to [employer]	[place]. Confirmed with her that insurance authorized the services for the bath aide. Pt shares that she is sleepy and cannot talk because she has taken double of two medications. Inquired of the pt was she was carrying out a suicide plan. Pt had threatened suicide earlier today. She denied suicidal ideations or plan. She voices having being up since 3am and frustration because of the issues with getting the bath aide. This nurse offered to call 911 because pt had taken too much medication. Pt shares that she is not trying to hurt herself. She shares her nephew is in the home. This nurse asked to speak to her nephew. Pt shares she was too sleepy and her nephew was laying down. She provided nephew's name and contact number. Call to nephew- Jenna Marquez- [phone]. He shares he is present in the home. Shared that pt has taken too much medication. Instructed nephew to try to awaken pt in an hour. If she cannot be aroused or is not breathing to call 911. He verbalized understanding. We discussed the authorization and home health services.

## 2018-03-12 NOTE — Patient Outreach (Signed)
[name]) Care Management  03/12/2018  Melanee Cordial 02-21-1946 [id number]  Call to [place] after confirming with [place] services for bath aide was approved. Left message with Mickle Plumb [phone] ext [phone]) to confirm receipt of fax authorization.

## 2018-03-12 NOTE — Patient Outreach (Signed)
[facility]) Care Management  03/12/2018  Jenna Marquez [birth year] [id number]   Telephone call to patient to discuss care coordination- need for bath aide. Pt shares because of her severe dermatitis her skin is shedding. She tearfully voices that she lacks female family assistance. Pt shares she has a nephew that assists her but, can't assist with personal care. [facility] services are involved. Nurse visited today. She voices having seen a dermatologist for her condition. Pt. states she spoke with her primary care doctor about obtaining an order for a bath aide. The physician sent the order to [facility]. She voices having followed up with the home care agency. They are currently awaiting authorization from the insurance company. Pt voices frustration that she received "the run around" with getting an order and conflicting information about the authorization for services. This nurse has outreached to [employer] company to inquiry about the status of the authorization.   Plan: Await response from insurance company regarding status of the authorization. Follow up with patient regarding status of the authorization for bath aide.

## 2018-03-13 ENCOUNTER — Telehealth: Payer: Self-pay | Admitting: *Deleted

## 2018-03-13 NOTE — Telephone Encounter (Signed)
Received Physician Orders [2] from [facility]; forwarded to provider/SLS 07/30  Received Home Health Certification and Plan of Care; forwarded to provider/SLS 07/30

## 2018-03-14 ENCOUNTER — Telehealth: Payer: Self-pay | Admitting: *Deleted

## 2018-03-14 NOTE — Telephone Encounter (Signed)
Received approval on prior authorization with [place]; forwarded to provider/SLS 07/31

## 2018-03-14 NOTE — Telephone Encounter (Signed)
Received Physician Orders from [facility]; forwarded to provider/SLS 07/31

## 2018-03-15 ENCOUNTER — Encounter: Payer: Self-pay | Admitting: *Deleted

## 2018-03-15 ENCOUNTER — Encounter: Payer: Self-pay | Admitting: Nurse Practitioner

## 2018-03-15 ENCOUNTER — Ambulatory Visit: Payer: PPO | Admitting: Nurse Practitioner

## 2018-03-15 VITALS — BP 120/72 | HR 110 | Ht 66.0 in | Wt 143.0 lb

## 2018-03-15 DIAGNOSIS — J449 Chronic obstructive pulmonary disease, unspecified: Secondary | ICD-10-CM | POA: Diagnosis not present

## 2018-03-15 DIAGNOSIS — J181 Lobar pneumonia, unspecified organism: Secondary | ICD-10-CM | POA: Diagnosis not present

## 2018-03-15 DIAGNOSIS — R042 Hemoptysis: Secondary | ICD-10-CM | POA: Diagnosis not present

## 2018-03-15 DIAGNOSIS — J189 Pneumonia, unspecified organism: Secondary | ICD-10-CM

## 2018-03-15 NOTE — Assessment & Plan Note (Signed)
Stay well hydrated Get plenty of rest Continue current medications Will order  Follow up chest CT Will follow up in 4 weeks

## 2018-03-15 NOTE — Assessment & Plan Note (Signed)
Continue current medications. 

## 2018-03-15 NOTE — Progress Notes (Signed)
@Patient  ID: Jenna Marquez, female    DOB: 06-Dec-1945, 72 y.o.   MRN: [id number]  Chief Complaint  Patient presents with  . Hospitalization Follow-up    pneumonia in right lung. breathing better    Referring provider: Copland, Gay Filler, MD  72 year old patient followed by Dr. Elsworth Soho.  Past history of COPD - GOLD 3, former smoker   HPI   Patient is being seen today for a hospital follow up visit. She was diagnosed with hemoptysis and right lower lobe pneumonia complicated by COPD. She was given ceftriaxone, azithromycin, and Unasyn while in the hospital. Discharged home on Augmentin and prednisone taper. Follow up appointment made to order follow up chest CT in 6 weeks to rule out tumor (CT showed Right lower lobe opacity). She states that she has been doing well. Denies fever, shortness of breath, or hemoptysis. Good appetite. Has been staying well hydrated.   Recent significant Encounters:   Hospital course - admitted on 02-18-18 and discharged on 02-22-18 Right lower lobe pneumonia Aspiration is likely diagnosis. Complicated by COPD. Initially started on ceftriaxone/azithromycin but transitioned to Unasyn for aspiration coverage.Augmentin on discharge. Blood cultures no growth to date.Pulmonology follow-up in 6 weeks for repeat CT scan to rule out tumor. Hemoptysis In setting of pneumonia. Resolved.Hemoglobin stable. COPD Significant wheezing on exam. On room air. Question anxiety component.Treated with Duonebs and solu-medrol.Prednisone taper on discharge.     Allergies  Allergen Reactions  . Avelox [Moxifloxacin Hcl In Nacl] Itching and Rash    Immunization History  Administered Date(s) Administered  . Influenza Split 07/14/2011, 07/24/2012, 04/29/2013  . Influenza,inj,Quad PF,6+ Mos 08/28/2014, 09/22/2015, 10/02/2016, 09/07/2017  . Pneumococcal Conjugate-13 10/30/2014  . Pneumococcal Polysaccharide-23 07/14/2011  . Zoster 03/23/2011    Past Medical History:    Diagnosis Date  . Allergic rhinitis   . Anxiety    "occasionally"  . Asthma   . COPD (chronic obstructive pulmonary disease) ([place])   . GERD (gastroesophageal reflux disease)   . Mobitz type 2 second degree AV block 01/06/2015    Tobacco History: Social History   Tobacco Use  Smoking Status Former Smoker  . Packs/day: 1.00  . Years: 35.00  . Pack years: 35.00  . Types: Cigarettes  . Last attempt to quit: 08/10/2012  . Years since quitting: 5.5  Smokeless Tobacco Never Used  Tobacco Comment   Started at age 83   Counseling given: former smoker Comment: Started at age 65   Outpatient Encounter Medications as of 03/15/2018  Medication Sig  . albuterol (PROAIR HFA) 108 (90 Base) MCG/ACT inhaler Inhale 1-2 puffs into the lungs every 6 (six) hours as needed for wheezing or shortness of breath.  . ALPRAZolam (XANAX) 0.5 MG tablet Take 1 tablet (0.5 mg total) by mouth every 8 (eight) hours as needed for anxiety (**DO NOT TAKE AT NIGHT WITH HYDROXYZINE**). Do not take with alcohol  . aspirin EC 81 MG EC tablet Take 1 tablet (81 mg total) by mouth daily.  . budesonide-formoterol (SYMBICORT) 160-4.5 MCG/ACT inhaler Inhale 2 puffs into the lungs 2 (two) times daily.  . calcium-vitamin D (OSCAL WITH D) 250-125 MG-UNIT tablet Take 1 tablet by mouth daily.  . Cholecalciferol (VITAMIN D) 2000 UNITS CAPS Take 1 capsule by mouth daily.  . cyproheptadine (PERIACTIN) 4 MG tablet Take 4 mg by mouth 2 (two) times daily.  . fluocinonide cream (LIDEX) 2.77 % Apply 1 application topically 2 (two) times daily.  . hydrOXYzine (ATARAX/VISTARIL) 10 MG tablet TAKE 2  TABLETS BY MOUTH AS NEEDED FOR ITCHING. DO NOT TAKE WITH ALPRAZOLAM  . ipratropium (ATROVENT) 0.02 % nebulizer solution Take 2.5 mLs (0.5 mg total) by nebulization every 6 (six) hours as needed for wheezing or shortness of breath.  . Multiple Vitamin (MULTIVITAMIN) tablet Take 2 tablets by mouth every morning.   . terbinafine (LAMISIL) 1 %  cream Apply 1 application topically 2 (two) times daily. Use for foot itching for 4 weeks  . triamcinolone ointment (KENALOG) 0.1 % APPLY TO AFFECTED AREA TWICE A DAY  . metoprolol tartrate (LOPRESSOR) 25 MG tablet Take 1 tablet (25 mg total) by mouth 2 (two) times daily.  . [DISCONTINUED] hydrochlorothiazide (HYDRODIURIL) 25 MG tablet TAKE 1/2 OR 1 DAILY AS NEEDED FOR SWELLING OF LEGS   No facility-administered encounter medications on file as of 03/15/2018.      Review of Systems  Review of Systems  Constitutional: Negative.   HENT: Negative.   Respiratory: Positive for cough and shortness of breath.   Cardiovascular: Negative.   Gastrointestinal: Negative.   Allergic/Immunologic: Negative.   Neurological: Negative.   Psychiatric/Behavioral: Negative.        Physical Exam  BP 120/72 (BP Location: Left Arm, Cuff Size: Normal)   Pulse (!) 110   Ht 5\' 6"  (1.676 m)   Wt 143 lb (64.9 kg)   SpO2 100%   BMI 23.08 kg/m   Wt Readings from Last 5 Encounters:  03/15/18 143 lb (64.9 kg)  02/18/18 143 lb (64.9 kg)  12/21/17 142 lb (64.4 kg)  12/21/17 140 lb 3.2 oz (63.6 kg)  12/18/17 140 lb 6.4 oz (63.7 kg)     Physical Exam  Constitutional: She is oriented to person, place, and time. She appears well-developed and well-nourished. No distress.  Cardiovascular: Normal rate and regular rhythm.  Pulmonary/Chest: Effort normal and breath sounds normal.  Neurological: She is alert and oriented to person, place, and time.  Psychiatric: She has a normal mood and affect.  Nursing note and vitals reviewed.    Lab Results:  CBC    Component Value Date/Time   WBC 8.5 03/05/2018 1646   RBC 2.79 (L) 03/05/2018 1646   HGB 10.4 (L) 03/05/2018 1646   HCT 30.6 (L) 03/05/2018 1646   PLT 349.0 03/05/2018 1646   MCV 109.8 Repeated and verified X2. (H) 03/05/2018 1646   MCH 36.9 (H) 02/22/2018 0526   MCHC 34.0 03/05/2018 1646   RDW 14.5 03/05/2018 1646   LYMPHSABS 0.6 (L) 02/20/2018  0804   MONOABS 0.9 02/20/2018 0804   EOSABS 0.0 02/20/2018 0804   BASOSABS 0.0 02/20/2018 0804    BMET    Component Value Date/Time   NA 142 03/05/2018 1646   K 3.8 03/05/2018 1646   CL 100 03/05/2018 1646   CO2 27 03/05/2018 1646   GLUCOSE 85 03/05/2018 1646   BUN 7 03/05/2018 1646   CREATININE 0.80 03/05/2018 1646   CALCIUM 8.0 (L) 03/05/2018 1646   GFRNONAA >60 02/22/2018 0526   GFRAA >60 02/22/2018 0526    BNP    Component Value Date/Time   BNP 136.4 (H) 02/18/2018 2114    ProBNP    Component Value Date/Time   PROBNP 238.8 (H) 02/10/2014 1528    Imaging: Ct Angio Chest Pe W And/or Wo Contrast  Result Date: 02/18/2018 CLINICAL DATA:  Hemoptysis mild-to-moderate, hematemesis, not on blood thinners, history COPD, hypertension, has had vaginal bleeding for 4 days EXAM: CT ANGIOGRAPHY CHEST WITH CONTRAST TECHNIQUE: Multidetector CT imaging of the chest  was performed using the standard protocol during bolus administration of intravenous contrast. Multiplanar CT image reconstructions and MIPs were obtained to evaluate the vascular anatomy. CONTRAST:  13mL ISOVUE-370 IOPAMIDOL (ISOVUE-370) INJECTION 76% IV COMPARISON:  09/10/2015 FINDINGS: Cardiovascular: LEFT subclavian transvenous pacemaker leads at RIGHT atrium and RIGHT ventricle. Atherosclerotic calcifications aorta, coronary arteries, and proximal great vessels. Aorta normal caliber without aneurysm or dissection. A small lymph node is again identified at the bifurcation of the lingular pulmonary artery. No pulmonary emboli are identified. No pericardial effusion. Mediastinum/Nodes: Esophagus normal appearance. Base of cervical region normal appearance. No thoracic adenopathy. 8 mm short axis precarinal lymph node noted. Lungs/Pleura: Emphysematous changes. Significant RIGHT lower lobe opacity favor representing pneumonia. Central peribronchial thickening. Remaining lungs clear. No pleural effusion or pneumothorax. Upper Abdomen:  Marked fatty infiltration of liver. Remaining visualized upper abdomen unremarkable old healed Musculoskeletal: Diffuse osseous demineralization. Review of the MIP images confirms the above findings. IMPRESSION: No evidence of pulmonary embolism. Atherosclerotic calcifications including coronary arteries. Emphysematous and bronchitic changes consistent with COPD. Opacification of the central portions of the RIGHT lower lobe favoring pneumonia, though pulmonary hemorrhage could have a similar appearance. Follow-up evaluation until resolution recommended to ensure resolution and exclude underlying abnormalities including tumor. Marked fatty infiltration of liver. Aortic Atherosclerosis (ICD10-I70.0) and Emphysema (ICD10-J43.9). Electronically Signed   By: Lavonia Dana M.D.   On: 02/18/2018 23:07   US Pelvic Complete With Transvaginal  Result Date: 02/19/2018 CLINICAL DATA:  72 y/o F; vaginal bleeding and spotting. History of tubal ligation. EXAM: TRANSABDOMINAL AND TRANSVAGINAL ULTRASOUND OF PELVIS TECHNIQUE: Both transabdominal and transvaginal ultrasound examinations of the pelvis were performed. Transabdominal technique was performed for global imaging of the pelvis including uterus, ovaries, adnexal regions, and pelvic cul-de-sac. It was necessary to proceed with endovaginal exam following the transabdominal exam to visualize the endometrium and adnexa. COMPARISON:  None FINDINGS: Uterus Measurements: 6.9 x 2.7 x 4.3 cm. Myoma measuring 2.4 x 3.2 x 2.8 cm of the right anterior fundus and submucosal location protruding into the endometrial cavity (image 57). Right mid myometrial myoma measuring 2.2 x 2.1 x 1.5 cm. Endometrium Thickness: 6 mm. Trace fluid at the lower uterine segment/cervical os. Right ovary Not visualized.  No adnexal lesion identified. Left ovary Not visualized.  No adnexal lesion identified. Other findings Trace simple fluid in the pelvis, likely physiologic. 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 Trace simple fluid within the lower uterine segment/cervix. Electronically Signed   By: Kristine Garbe M.D.   On: 02/19/2018 15:26      Assessment & Plan:   COPD (chronic obstructive pulmonary disease) gold stage C. Continue current medications  Community acquired pneumonia Stay well hydrated Get plenty of rest Continue current medications Will order  Follow up chest CT Will follow up in 4 weeks   Hemoptysis has resolved   Fenton Foy, NP 03/15/2018

## 2018-03-15 NOTE — Patient Instructions (Addendum)
Stay well hydrated Get plenty of rest Continue current medications Will order  Follow up chest CT Will follow up in 4 weeks

## 2018-03-19 ENCOUNTER — Telehealth: Payer: Self-pay | Admitting: *Deleted

## 2018-03-19 NOTE — Telephone Encounter (Signed)
Received Physician Orders [x3] from [facility]; forwarded to provider/SLS 08/05

## 2018-03-20 ENCOUNTER — Telehealth: Payer: Self-pay | Admitting: *Deleted

## 2018-03-20 ENCOUNTER — Telehealth: Payer: Self-pay

## 2018-03-20 DIAGNOSIS — J449 Chronic obstructive pulmonary disease, unspecified: Secondary | ICD-10-CM | POA: Diagnosis not present

## 2018-03-20 NOTE — Telephone Encounter (Signed)
Received Physician Orders from [facility] [x2]; forwarded to provider/SLS 08/06

## 2018-03-20 NOTE — Telephone Encounter (Signed)
Patty- [facility] is calling to add the patient has reported vaginal bleeding, and she is unable to schedule a appt with her OBGYN until 2 weeks. Chong Sicilian is requesting a call back in  Regards to this, CB# [phone]

## 2018-03-20 NOTE — Telephone Encounter (Signed)
Received Physician Orders[x3] from [facility]; forwarded to provider/SLS 08/06

## 2018-03-20 NOTE — Telephone Encounter (Signed)
Copied from [place] [phone]. Topic: Quick Communication - See Telephone Encounter >> Mar 20, 2018  9:46 AM Antonieta Iba C wrote: CRM for notification. See Telephone encounter for: 03/20/18.  Patty w/ [facility] is requesting a call back. She is requesting a Rx for a new nebulizer faxed to: [phone] and also she would like to report that pt is not taking her water pill.   CB: [phone]

## 2018-03-21 ENCOUNTER — Telehealth: Payer: Self-pay | Admitting: Family Medicine

## 2018-03-21 NOTE — Telephone Encounter (Unsigned)
Copied from [place] [phone]. Topic: Quick Communication - See Telephone Encounter >> Mar 20, 2018  9:46 AM Antonieta Iba C wrote: CRM for notification. See Telephone encounter for: 03/20/18.  Patty w/ [facility] is requesting a call back. She is requesting a Rx for a new nebulizer faxed to: [phone] and also she would like to report that pt is not taking her water pill.   CB: [phone]

## 2018-03-22 NOTE — Telephone Encounter (Signed)
Called Patty back- they need an rx for a neb machine which I will fax to the number provided.  She is having some scant vaginal bleeding  She has an OBG appt scheduled for 8/22- advised that this is fine, she probably will need an endo bx which I am not able to do for her in the office

## 2018-03-23 NOTE — Telephone Encounter (Signed)
Called pt and confirmed that she does have  GYN appt for later on this month.  She is having mild vaginal spotting. GYN care is needed

## 2018-03-27 ENCOUNTER — Telehealth: Payer: Self-pay | Admitting: *Deleted

## 2018-03-27 NOTE — Telephone Encounter (Signed)
Received Physician Orders from [facility]; forwarded to provider/SLS 08/13

## 2018-03-29 ENCOUNTER — Ambulatory Visit (HOSPITAL_BASED_OUTPATIENT_CLINIC_OR_DEPARTMENT_OTHER)
Admission: RE | Admit: 2018-03-29 | Discharge: 2018-03-29 | Disposition: A | Discharge status: Home / Self Care | Payer: PPO | Source: Ambulatory Visit | Attending: Nurse Practitioner | Admitting: Nurse Practitioner

## 2018-03-29 DIAGNOSIS — R042 Hemoptysis: Secondary | ICD-10-CM | POA: Insufficient documentation

## 2018-03-29 DIAGNOSIS — J439 Emphysema, unspecified: Secondary | ICD-10-CM | POA: Diagnosis not present

## 2018-03-29 DIAGNOSIS — J449 Chronic obstructive pulmonary disease, unspecified: Secondary | ICD-10-CM | POA: Diagnosis not present

## 2018-03-29 DIAGNOSIS — I2510 Atherosclerotic heart disease of native coronary artery without angina pectoris: Secondary | ICD-10-CM | POA: Diagnosis not present

## 2018-03-29 DIAGNOSIS — I700 Atherosclerosis of aorta: Secondary | ICD-10-CM | POA: Diagnosis not present

## 2018-03-29 DIAGNOSIS — R918 Other nonspecific abnormal finding of lung field: Secondary | ICD-10-CM | POA: Diagnosis not present

## 2018-04-05 ENCOUNTER — Other Ambulatory Visit (HOSPITAL_COMMUNITY)
Admission: RE | Admit: 2018-04-05 | Discharge: 2018-04-05 | Disposition: A | Discharge status: Home / Self Care | Payer: PPO | Source: Ambulatory Visit | Attending: Obstetrics & Gynecology | Admitting: Obstetrics & Gynecology

## 2018-04-05 ENCOUNTER — Encounter: Payer: Self-pay | Admitting: Obstetrics & Gynecology

## 2018-04-05 ENCOUNTER — Ambulatory Visit (INDEPENDENT_AMBULATORY_CARE_PROVIDER_SITE_OTHER): Payer: PPO | Admitting: Obstetrics & Gynecology

## 2018-04-05 VITALS — BP 149/59 | HR 76 | Wt 136.0 lb

## 2018-04-05 DIAGNOSIS — N950 Postmenopausal bleeding: Secondary | ICD-10-CM | POA: Diagnosis not present

## 2018-04-05 NOTE — Progress Notes (Signed)
Subjective:     Jenna Marquez is a 72 y.o. female here for a routine exam.  LMP at age 73 or 1.  20Current complaints: Pt reports bleeding in the vagina. She was admitted to [facility]. She reports that the bleedign was just a 'smudge'.  Pt was dx'd with pneumonia. Pt reports that the bleeding stopped.  The admission was in July.   Pt denies pain. Pt reports some weight loss. She is not sure if its related to fluid loss.   Was 144#s now she is 136s. This is her weight from last year.  Pt was on fluid pill this year.   Not sexually active >16 years.     .    Gynecologic History No LMP recorded. Patient is postmenopausal. Contraception: post menopausal status Last Pap: >25 years. Results were: normal Last mammogram: 06/2017. Results were: normal  Obstetric History OB History  Gravida Para Term Preterm AB Living  1 1 1         SAB TAB Ectopic Multiple Live Births          1    # Outcome Date GA Lbr Len/2nd Weight Sex Delivery Anes PTL Lv  1 Term      Vag-Spont      The following portions of the patient's history were reviewed and updated as appropriate: allergies, current medications, past family history, past medical history, past social history, past surgical history and problem list.  Review of Systems Pertinent items are noted in HPI.    Objective:  BP (!) 149/59   Pulse 76   Wt 136 lb (61.7 kg)   BMI 21.95 kg/m   CONSTITUTIONAL: Well-developed, well-nourished female in no acute distress.  HENT:  Normocephalic, atraumatic EYES: Conjunctivae and EOM are normal. No scleral icterus.  NECK: Normal range of motion SKIN: Skin is warm and dry. No rash noted. Not diaphoretic.No pallor. [place]: Alert and oriented to person, place, and time. Normal coordination.  Abd: soft, ND, NT  The indications for endometrial biopsy were reviewed.   Risks of the biopsy including cramping, bleeding, infection, uterine perforation, inadequate specimen and need for additional procedures  were discussed.  The patient states she understands and agrees to undergo procedure today. Consent was signed. Time out was performed. Urine HCG was negative. A sterile speculum was placed in the patient's vagina and the cervix was prepped with Betadine. A single-toothed tenaculum was placed on the anterior lip of the cervix to stabilize it. The 3 mm pipelle was introduced into the endometrial cavity without difficulty to a depth of 7cm, and a moderate amount of tissue was obtained and sent to pathology. The instruments were removed from the patient's vagina. Minimal bleeding from the cervix was noted. The patient tolerated the procedure well.    CLINICAL DATA:  72 y/o F; vaginal bleeding and spotting. History of tubal ligation.  EXAM: TRANSABDOMINAL AND TRANSVAGINAL ULTRASOUND OF PELVIS  TECHNIQUE: Both transabdominal and transvaginal ultrasound examinations of the pelvis were performed. Transabdominal technique was performed for global imaging of the pelvis including uterus, ovaries, adnexal regions, and pelvic cul-de-sac. It was necessary to proceed with endovaginal exam following the transabdominal exam to visualize the endometrium and adnexa.  COMPARISON:  None  FINDINGS: Uterus  Measurements: 6.9 x 2.7 x 4.3 cm. Myoma measuring 2.4 x 3.2 x 2.8 cm of the right anterior fundus and submucosal location protruding into the endometrial cavity (image 57). Right mid myometrial myoma measuring 2.2 x 2.1 x 1.5 cm.  Endometrium  Thickness:  6 mm. Trace fluid at the lower uterine segment/cervical os.  Right ovary  Not visualized.  No adnexal lesion identified.  Left ovary  Not visualized.  No adnexal lesion identified.  Other findings  Trace simple fluid in the pelvis, likely physiologic.  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 Trace simple fluid within the lower uterine segment/cervix.    Assessment and Plan:   . Post-menopausal bleeding S/p endo bx - Surgical pathology -`F/u in 1 week or sooner prn  Akemi Overholser L. Harraway-Smith, M.D., [place]lynn June

## 2018-04-05 NOTE — Progress Notes (Signed)
Patient has had some on and off vaginal bleeding- spotting. Kathrene Alu RN

## 2018-04-05 NOTE — Patient Instructions (Signed)
Endometrial Biopsy Endometrial biopsy is a procedure in which a tissue sample is taken from inside the uterus. The sample is taken from the endometrium, which is the lining of the uterus. The tissue sample is then checked under a microscope to see if the tissue is normal or abnormal. This procedure helps to determine where you are in your menstrual cycle and how hormone levels are affecting the lining of the uterus. This procedure may also be used to evaluate uterine bleeding or to diagnose endometrial cancer, endometrial tuberculosis, polyps, or other inflammatory conditions. Tell a health care provider about:  Any allergies you have.  All medicines you are taking, including vitamins, herbs, eye drops, creams, and over-the-counter medicines.  Any problems you or family members have had with anesthetic medicines.  Any blood disorders you have.  Any surgeries you have had.  Any medical conditions you have.  Whether you are pregnant or may be pregnant. What are the risks? Generally, this is a safe procedure. However, problems may occur, including:  Bleeding.  Pelvic infection.  Puncture of the wall of the uterus with the biopsy device (rare).  What happens before the procedure?  Keep a record of your menstrual cycles as told by your health care provider. You may need to schedule your procedure for a specific time in your cycle.  You may want to bring a sanitary pad to wear after the procedure.  Ask your health care provider about: ? Changing or stopping your regular medicines. This is especially important if you are taking diabetes medicines or blood thinners. ? Taking medicines such as aspirin and ibuprofen. These medicines can thin your blood. Do not take these medicines before your procedure if your health care provider instructs you not to.  Plan to have someone take you home from the hospital or clinic. What happens during the procedure?  To lower your risk of  infection: ? Your health care team will wash or sanitize their hands.  You will lie on an exam table with your feet and legs supported as in a pelvic exam.  Your health care provider will insert an instrument (speculum) into your vagina to see your cervix.  Your cervix will be cleansed with an antiseptic solution.  A medicine (local anesthetic) will be used to numb the cervix.  A forceps instrument (tenaculum) will be used to hold your cervix steady for the biopsy.  A thin, rod-like instrument (uterine sound) will be inserted through your cervix to determine the length of your uterus and the location where the biopsy sample will be removed.  A thin, flexible tube (catheter) will be inserted through your cervix and into the uterus. The catheter will be used to collect the biopsy sample from your endometrial tissue.  The catheter and speculum will then be removed, and the tissue sample will be sent to a lab for examination. What happens after the procedure?  You will rest in a recovery area until you are ready to go home.  You may have mild cramping and a small amount of vaginal bleeding. This is normal.  It is up to you to get the results of your procedure. Ask your health care provider, or the department that is doing the procedure, when your results will be ready. Summary  Endometrial biopsy is a procedure in which a tissue sample is taken from the endometrium, which is the lining of the uterus.  This procedure may help to diagnose menstrual cycle problems, abnormal bleeding, or other conditions affecting   the endometrium.  Before the procedure, keep a record of your menstrual cycles as told by your health care provider.  The tissue sample that is removed will be checked under a microscope to see if it is normal or abnormal. This information is not intended to replace advice given to you by your health care provider. Make sure you discuss any questions you have with your health care  provider. Document Released: 12/02/2004 Document Revised: 08/17/2016 Document Reviewed: 08/17/2016 Elsevier Interactive Patient Education  2017 Elsevier Inc. Endometrial Biopsy, Care After This sheet gives you information about how to care for yourself after your procedure. Your health care provider may also give you more specific instructions. If you have problems or questions, contact your health care provider. What can I expect after the procedure? After the procedure, it is common to have:  Mild cramping.  A small amount of vaginal bleeding for a few days. This is normal.  Follow these instructions at home:  Take over-the-counter and prescription medicines only as told by your health care provider.  Do not douche, use tampons, or have sexual intercourse until your health care provider approves.  Return to your normal activities as told by your health care provider. Ask your health care provider what activities are safe for you.  Follow instructions from your health care provider about any activity restrictions, such as restrictions on strenuous exercise or heavy lifting. Contact a health care provider if:  You have heavy bleeding, or bleed for longer than 2 days after the procedure.  You have bad smelling discharge from your vagina.  You have a fever or chills.  You have a burning sensation when urinating or you have difficulty urinating.  You have severe pain in your lower abdomen. Get help right away if:  You have severe cramps in your stomach or back.  You pass large blood clots.  Your bleeding increases.  You become weak or light-headed, or you pass out. Summary  After the procedure, it is common to have mild cramping and a small amount of vaginal bleeding for a few days.  Do not douche, use tampons, or have sexual intercourse until your health care provider approves.  Return to your normal activities as told by your health care provider. Ask your health care  provider what activities are safe for you. This information is not intended to replace advice given to you by your health care provider. Make sure you discuss any questions you have with your health care provider. Document Released: 05/22/2013 Document Revised: 08/17/2016 Document Reviewed: 08/17/2016 Elsevier Interactive Patient Education  2017 Elsevier Inc.  

## 2018-04-06 ENCOUNTER — Telehealth: Payer: Self-pay | Admitting: *Deleted

## 2018-04-06 ENCOUNTER — Encounter: Payer: Self-pay | Admitting: Obstetrics & Gynecology

## 2018-04-06 LAB — CYTOLOGY - PAP
Diagnosis: NEGATIVE
HPV: NOT DETECTED

## 2018-04-06 NOTE — Telephone Encounter (Signed)
Called and gave VO 

## 2018-04-06 NOTE — Telephone Encounter (Signed)
Copied from [place] [phone]. Topic: General - Other >> Apr 05, 2018  2:36 PM Judyann Munson wrote: Reason for CRM: Helene Kelp- [facility] is calling in Verbal orders to extend home physical therapy for 2 twice a week for 4 more week.  Cb# [phone]

## 2018-04-10 ENCOUNTER — Telehealth: Payer: Self-pay

## 2018-04-10 NOTE — Telephone Encounter (Signed)
Copied from [place]. Topic: General - Other >> Apr 10, 2018  3:52 PM Berneta Levins wrote: Reason for CRM:   Pt states that [facility] told her to call and request that her home health aid be extended. Pt can be reached at [phone]

## 2018-04-11 NOTE — Telephone Encounter (Signed)
Signed order today to extend [facility]

## 2018-04-13 ENCOUNTER — Ambulatory Visit (INDEPENDENT_AMBULATORY_CARE_PROVIDER_SITE_OTHER): Payer: PPO | Admitting: Obstetrics & Gynecology

## 2018-04-13 ENCOUNTER — Encounter: Payer: Self-pay | Admitting: Obstetrics & Gynecology

## 2018-04-13 VITALS — BP 140/60 | HR 78 | Wt 135.0 lb

## 2018-04-13 DIAGNOSIS — N950 Postmenopausal bleeding: Secondary | ICD-10-CM

## 2018-04-13 NOTE — Progress Notes (Signed)
History:  72 y.o. G1P1000 here today for results review. Pt reports that she cont to see some spots of blood. She is not sure that its coming from the vagina and say that she has a skin disorder and she scratches at night an it could be related to that.  Pt denies weight loss or other constitutional sx.      The following portions of the patient's history were reviewed and updated as appropriate: allergies, current medications, past family history, past medical history, past social history, past surgical history and problem list.  Review of Systems:  Pertinent items are noted in HPI.    Objective:  Physical Exam Blood pressure 140/60, pulse 78, weight 135 lb (61.2 kg).  CONSTITUTIONAL: Well-developed, well-nourished female in no acute distress.  HENT:  Normocephalic, atraumatic EYES: Conjunctivae and EOM are normal. No scleral icterus.  NECK: Normal range of motion SKIN: Skin is warm and dry. No rash noted. Not diaphoretic.No pallor. [place]: Alert and oriented to person, place, and time. Normal coordination.  Pelvic: deferred  Labs and Imaging 02/19/2018 EXAM: TRANSABDOMINAL AND TRANSVAGINAL ULTRASOUND OF PELVIS  TECHNIQUE: Both transabdominal and transvaginal ultrasound examinations of the pelvis were performed. Transabdominal technique was performed for global imaging of the pelvis including uterus, ovaries, adnexal regions, and pelvic cul-de-sac. It was necessary to proceed with endovaginal exam following the transabdominal exam to visualize the endometrium and adnexa.  COMPARISON:  None  FINDINGS: Uterus  Measurements: 6.9 x 2.7 x 4.3 cm. Myoma measuring 2.4 x 3.2 x 2.8 cm of the right anterior fundus and submucosal location protruding into the endometrial cavity (image 57). Right mid myometrial myoma measuring 2.2 x 2.1 x 1.5 cm.  Endometrium  Thickness: 6 mm. Trace fluid at the lower uterine segment/cervical os.  Right ovary  Not visualized.  No  adnexal lesion identified.  Left ovary  Not visualized.  No adnexal lesion identified.  Other findings  Trace simple fluid in the pelvis, likely physiologic.  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 Trace simple fluid within the lower uterine segment/cervix.  04/05/2018 Endometrium, biopsy - PREDOMINANTLY MUCUS WITH FRAGMENT OF BENIGN SQUAMOUS EPITHELIUM - RARE STRIPS OF BENIGN GLANDULAR EPITHELIUM   Assessment & Plan:  PMPB- unk etiology. I am not convinced that it is GYN in origin. Normal [place] and biopsy.   I have reviewed the results with the pt and her son and daughter in law.  Rec f/u in 3 months.    If sx persist or worsen pt encouraged to f/u prior to that visit.   Total face-to-face time with patient was 15 min.  Greater than 50% was spent in counseling and coordination of care with the patient.  Mishawn Hemann L. Ihor Dow, M.D., [place] .

## 2018-04-17 ENCOUNTER — Encounter (HOSPITAL_BASED_OUTPATIENT_CLINIC_OR_DEPARTMENT_OTHER): Payer: Self-pay

## 2018-04-17 ENCOUNTER — Other Ambulatory Visit: Payer: Self-pay

## 2018-04-17 ENCOUNTER — Emergency Department (HOSPITAL_BASED_OUTPATIENT_CLINIC_OR_DEPARTMENT_OTHER): Payer: PPO

## 2018-04-17 ENCOUNTER — Emergency Department (HOSPITAL_BASED_OUTPATIENT_CLINIC_OR_DEPARTMENT_OTHER)
Admission: EM | Admit: 2018-04-17 | Discharge: 2018-04-18 | Disposition: A | Discharge status: Home / Self Care | Payer: PPO | Attending: Emergency Medicine | Admitting: Emergency Medicine

## 2018-04-17 DIAGNOSIS — W19XXXA Unspecified fall, initial encounter: Secondary | ICD-10-CM

## 2018-04-17 DIAGNOSIS — Y939 Activity, unspecified: Secondary | ICD-10-CM | POA: Insufficient documentation

## 2018-04-17 DIAGNOSIS — R410 Disorientation, unspecified: Secondary | ICD-10-CM | POA: Diagnosis not present

## 2018-04-17 DIAGNOSIS — Z7982 Long term (current) use of aspirin: Secondary | ICD-10-CM | POA: Diagnosis not present

## 2018-04-17 DIAGNOSIS — N183 Chronic kidney disease, stage 3 (moderate): Secondary | ICD-10-CM | POA: Insufficient documentation

## 2018-04-17 DIAGNOSIS — S4991XA Unspecified injury of right shoulder and upper arm, initial encounter: Secondary | ICD-10-CM | POA: Diagnosis not present

## 2018-04-17 DIAGNOSIS — Y999 Unspecified external cause status: Secondary | ICD-10-CM | POA: Insufficient documentation

## 2018-04-17 DIAGNOSIS — J449 Chronic obstructive pulmonary disease, unspecified: Secondary | ICD-10-CM | POA: Diagnosis not present

## 2018-04-17 DIAGNOSIS — Z87891 Personal history of nicotine dependence: Secondary | ICD-10-CM | POA: Insufficient documentation

## 2018-04-17 DIAGNOSIS — W1789XA Other fall from one level to another, initial encounter: Secondary | ICD-10-CM | POA: Diagnosis not present

## 2018-04-17 DIAGNOSIS — R51 Headache: Secondary | ICD-10-CM | POA: Diagnosis not present

## 2018-04-17 DIAGNOSIS — Y92009 Unspecified place in unspecified non-institutional (private) residence as the place of occurrence of the external cause: Secondary | ICD-10-CM | POA: Diagnosis not present

## 2018-04-17 DIAGNOSIS — I129 Hypertensive chronic kidney disease with stage 1 through stage 4 chronic kidney disease, or unspecified chronic kidney disease: Secondary | ICD-10-CM | POA: Insufficient documentation

## 2018-04-17 DIAGNOSIS — M79621 Pain in right upper arm: Secondary | ICD-10-CM | POA: Insufficient documentation

## 2018-04-17 DIAGNOSIS — S199XXA Unspecified injury of neck, initial encounter: Secondary | ICD-10-CM | POA: Diagnosis not present

## 2018-04-17 DIAGNOSIS — Z950 Presence of cardiac pacemaker: Secondary | ICD-10-CM | POA: Insufficient documentation

## 2018-04-17 DIAGNOSIS — S0990XA Unspecified injury of head, initial encounter: Secondary | ICD-10-CM | POA: Diagnosis not present

## 2018-04-17 DIAGNOSIS — S6991XA Unspecified injury of right wrist, hand and finger(s), initial encounter: Secondary | ICD-10-CM | POA: Diagnosis not present

## 2018-04-17 DIAGNOSIS — Z79899 Other long term (current) drug therapy: Secondary | ICD-10-CM | POA: Insufficient documentation

## 2018-04-17 MED ORDER — ACETAMINOPHEN 500 MG PO TABS
1000.0000 mg | ORAL_TABLET | Freq: Once | ORAL | Status: AC
  Administered 2018-04-18: 1000 mg via ORAL
  Filled 2018-04-17: qty 2

## 2018-04-17 NOTE — ED Notes (Signed)
Pt is in CT

## 2018-04-17 NOTE — ED Notes (Signed)
Pt became angry when asked exact location of pain to right UE-unable to establish exact pain site for xrays-no xrays ordered at this time

## 2018-04-17 NOTE — ED Triage Notes (Addendum)
Pt states she lost her balance and fell at home-hit head on caibnet-no break in skin-no LOC-c/o pain to entire right UE-pt to triage in w/c-NAD

## 2018-04-18 ENCOUNTER — Telehealth: Payer: Self-pay | Admitting: *Deleted

## 2018-04-18 ENCOUNTER — Telehealth: Payer: Self-pay

## 2018-04-18 NOTE — Telephone Encounter (Signed)
Called patient. Explained to her that  I have not recently received paperwork from [facility] this week. However last week I did receive a packet from them- dr copland has signed and faxed. I walked in this morning and on my desk was a denial letter for 2/3 requested services. When I was explaining to the patient the request was denied she began to yell and ask "why it was denied if they said they didn't even receive it" I am unsure why it was denied if they told her they never got the request.. She told me "I needed to respond to the denial and fax it back to them" I explained to her that there was no where on this sheet for me to respond I would have to do an appeal. She asked "why I didn't do something about this sooner?" I said "I just received this letter this morning and I have not yet had the time to call"  She said " oh you haven't had time that's real nice not having time" I stated we have been seeing patients since 8:15 this morning and I called her as soon as I could. She then said she was getting another call and told me to hang on. After 12 minutes of waiting I ended the call. Will try again later if patient does not call me back.

## 2018-04-18 NOTE — Telephone Encounter (Signed)
Copied from [place]. Topic: General - Other >> Apr 10, 2018  3:52 PM Berneta Levins wrote: Reason for CRM:   Pt states that [place] told her to call and request that her home health aid be extended. Pt can be reached at [phone] >> Apr 18, 2018  2:33 PM Ivar Drape wrote: Patient stated that [facility] never received the referral for an extention for the Aid.

## 2018-04-18 NOTE — Telephone Encounter (Signed)
Received Approval from [facility] at [facility] for [facility] ordered for patient; forwarded to provider/SLS 09/04

## 2018-04-18 NOTE — ED Provider Notes (Signed)
[facility] Provider Note   CSN: [id number] Arrival date & time: 04/17/18  2106     History   Chief Complaint Chief Complaint  Patient presents with  . Fall    HPI Jenna Marquez is a 72 y.o. female.  The history is provided by the patient.  Fall  The current episode started 3 to 5 hours ago. The problem occurs constantly. The problem has not changed since onset.Pertinent negatives include no chest pain, no abdominal pain and no shortness of breath. Nothing aggravates the symptoms. Nothing relieves the symptoms. She has tried nothing for the symptoms. The treatment provided no relief.  tripped and fell forward striking head and right humerus.  No weakness no numbness no n/v/d.    Past Medical History:  Diagnosis Date  . Allergic rhinitis   . Anxiety    "occasionally"  . Asthma   . COPD (chronic obstructive pulmonary disease) ([place])   . GERD (gastroesophageal reflux disease)   . 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 MDD (major depressive disorder), single episode, severe , no psychosis ([place])   . Hemoptysis 02/19/2018  . Community acquired pneumonia 02/19/2018  . Vagina bleeding 02/19/2018  . Alcohol abuse 02/19/2018  . Left knee pain 07/11/2017  . PVD (peripheral vascular disease) ([place]) 04/04/2017  . HTN (hypertension) 11/24/2016  . Paroxysmal atrial fibrillation ([place]) 09/30/2016  . Acute respiratory failure with hypoxia ([place]) 09/30/2016  . Mobitz type 2 second degree AV block 09/30/2016  . Swelling of arm 05/22/2015  . Left shoulder pain 05/22/2015  . CKD (chronic kidney disease) stage 3, GFR 30-59 ml/min ([facility]) 05/11/2015  . Pacemaker 04/22/2015  . Generalized anxiety disorder 01/11/2012  . 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   . Tobacco abuse     Past Surgical History:  Procedure Laterality Date  . [place]  . EP  IMPLANTABLE DEVICE N/A 01/07/2015   Procedure: Pacemaker Implant;  Surgeon: Evans Lance, MD;  Location: [facility];  Service: Cardiovascular;  Laterality: N/A;  . PACEMAKER INSERTION    . TONSILLECTOMY AND ADENOIDECTOMY  1954     OB History    Gravida  1   Para  1   Term  1   Preterm      AB      Living        SAB      TAB      Ectopic      Multiple      Live Births  1            Home Medications    Prior to Admission medications   Medication Sig Start Date End Date Taking? Authorizing Provider  albuterol (PROAIR HFA) 108 (90 Base) MCG/ACT inhaler Inhale 1-2 puffs into the lungs every 6 (six) hours as needed for wheezing or shortness of breath. 12/21/17   Parrett, Fonnie Mu, NP  ALPRAZolam (XANAX) 0.5 MG tablet Take 1 tablet (0.5 mg total) by mouth every 8 (eight) hours as needed for anxiety (**DO NOT TAKE AT NIGHT WITH HYDROXYZINE**). Do not take with alcohol 07/04/17   Saguier, Percell Miller, PA-C  aspirin EC 81 MG EC tablet Take 1 tablet (81 mg total) by mouth daily. 10/02/16   Nita Sells, MD  budesonide-formoterol (SYMBICORT) 160-4.5 MCG/ACT inhaler Inhale 2 puffs into the lungs 2 (two) times daily. 10/27/17  Parrett, Tammy S, NP  calcium-vitamin D (OSCAL WITH D) 250-125 MG-UNIT tablet Take 1 tablet by mouth daily.    [provider]  Cholecalciferol (VITAMIN D) 2000 UNITS CAPS Take 1 capsule by mouth daily.    [provider]  cyproheptadine (PERIACTIN) 4 MG tablet Take 4 mg by mouth 2 (two) times daily.    [provider]  fluocinonide cream (LIDEX) 1.61 % Apply 1 application topically 2 (two) times daily.    [provider]  hydrOXYzine (ATARAX/VISTARIL) 10 MG tablet TAKE 2 TABLETS BY MOUTH AS NEEDED FOR ITCHING. DO NOT TAKE WITH ALPRAZOLAM 08/04/17   Saguier, Percell Miller, PA-C  ipratropium (ATROVENT) 0.02 % nebulizer solution Take 2.5 mLs (0.5 mg total) by nebulization every 6 (six) hours as needed for wheezing or  shortness of breath. 12/21/17   Parrett, Fonnie Mu, NP  metoprolol tartrate (LOPRESSOR) 25 MG tablet Take 1 tablet (25 mg total) by mouth 2 (two) times daily. 10/10/17 01/08/18  Camnitz, Ocie Doyne, MD  Multiple Vitamin (MULTIVITAMIN) tablet Take 2 tablets by mouth every morning.     [provider]  terbinafine (LAMISIL) 1 % cream Apply 1 application topically 2 (two) times daily. Use for foot itching for 4 weeks 03/05/18   Copland, Gay Filler, MD  triamcinolone ointment (KENALOG) 0.1 % APPLY TO AFFECTED AREA TWICE A DAY 07/10/17   [provider]    Family History Family History  Problem Relation Age of Onset  . Coronary artery disease Mother   . Heart disease Mother        CHF  . Diabetes Neg Hx   . Cancer Neg Hx   . Stroke Neg Hx   . Colon cancer Neg Hx     Social History Social History   Tobacco Use  . Smoking status: Former Smoker    Packs/day: 1.00    Years: 35.00    Pack years: 35.00    Types: Cigarettes    Last attempt to quit: 08/10/2012    Years since quitting: 5.6  . Smokeless tobacco: Never Used  . Tobacco comment: Started at age 65  Substance Use Topics  . Alcohol use: Yes    Alcohol/week: 28.0 standard drinks    Types: 28 Shots of liquor per week    Comment: 01/06/2015 "2 shot mixed drinks, 2/day"  . Drug use: No     Allergies   Avelox [moxifloxacin hcl in nacl]   Review of Systems Review of Systems  Eyes: Negative for visual disturbance.  Respiratory: Negative for shortness of breath.   Cardiovascular: Negative for chest pain.  Gastrointestinal: Negative for abdominal pain.  Musculoskeletal: Negative for back pain and joint swelling.  Neurological: Negative for speech difficulty, weakness and numbness.  All other systems reviewed and are negative.    Physical Exam Updated Vital Signs BP (!) 170/74 (BP Location: Left Arm)   Pulse 97   Temp (!) 97.5 F (36.4 C) (Oral)   Resp 16   Ht 5\' 6"  (1.676 m)   Wt 62.1 kg   SpO2 95%    BMI 22.11 kg/m   Physical Exam  Constitutional: She is oriented to person, place, and time. She appears well-developed and well-nourished. No distress.  HENT:  Head: Normocephalic. Head is with contusion. Head is without raccoon's eyes and without Battle's sign.    Right Ear: No mastoid tenderness. No hemotympanum.  Left Ear: No mastoid tenderness. No hemotympanum.  Nose: Nose normal.  Mouth/Throat: No oropharyngeal exudate.  Eyes: Pupils are  equal, round, and reactive to light. Conjunctivae and EOM are normal.  Neck: Normal range of motion. Neck supple.  Cardiovascular: Normal rate, regular rhythm, normal heart sounds and intact distal pulses.  Pulmonary/Chest: Effort normal and breath sounds normal. No stridor.  Abdominal: Soft. Bowel sounds are normal. There is no tenderness. There is no guarding.  Musculoskeletal: She exhibits no edema, tenderness or deformity.       Right shoulder: Normal.       Right elbow: Normal.      Right wrist: Normal.       Cervical back: Normal.       Thoracic back: Normal.       Right upper arm: Normal.       Right forearm: Normal.       Right hand: Normal. She exhibits normal capillary refill. Normal sensation noted. Normal strength noted.  Neurological: She is alert and oriented to person, place, and time.  Skin: Skin is warm and dry. Capillary refill takes less than 2 seconds.  Psychiatric: She has a normal mood and affect.     ED Treatments / Results  Labs (all labs ordered are listed, but only abnormal results are displayed) Labs Reviewed - No data to display  EKG None  Radiology Ct Head Wo Contrast  Result Date: 04/18/2018 CLINICAL DATA:  Fall tonight. Frontal hematoma. EXAM: CT HEAD WITHOUT CONTRAST CT CERVICAL SPINE WITHOUT CONTRAST TECHNIQUE: Multidetector CT imaging of the head and cervical spine was performed following the standard protocol without intravenous contrast. Multiplanar CT image reconstructions of the cervical spine were  also generated. COMPARISON:  MRI brain 11/03/2012 FINDINGS: CT HEAD FINDINGS Brain: Mild diffuse cerebral atrophy. Ventricular dilatation consistent with central atrophy. Low-attenuation changes in the deep white matter consistent with small vessel ischemia. No evidence of acute infarction, hemorrhage, hydrocephalus, extra-axial collection or mass lesion/mass effect. Vascular: Moderate intracranial arterial vascular calcifications are present. Skull: Calvarium appears intact. Sinuses/Orbits: Paranasal sinuses and mastoid air cells are clear. Other: None. CT CERVICAL SPINE FINDINGS Alignment: Normal alignment of the cervical vertebrae and facet joints. C1-2 articulation appears intact. Skull base and vertebrae: No vertebral compression deformities. No focal bone lesion or bone destruction. Soft tissues and spinal canal: No abnormal paraspinal soft tissue mass or infiltration. Vascular calcifications. Disc levels: Degenerative changes throughout the cervical spine with narrowed interspaces and endplate hypertrophic changes, most prominent at C5-6, C6-7 and C7-T1 levels. Degenerative changes in the facet joints. Upper chest: Emphysematous changes in the lung apices. Vascular calcifications. Other: None. IMPRESSION: 1. No acute intracranial abnormalities. Chronic atrophy and small vessel ischemic changes. 2. Normal alignment of the cervical spine. Degenerative changes. No acute displaced fractures identified. Electronically Signed   By: Lucienne Capers M.D.   On: 04/18/2018 00:18   Ct Cervical Spine Wo Contrast  Result Date: 04/18/2018 CLINICAL DATA:  Fall tonight. Frontal hematoma. EXAM: CT HEAD WITHOUT CONTRAST CT CERVICAL SPINE WITHOUT CONTRAST TECHNIQUE: Multidetector CT imaging of the head and cervical spine was performed following the standard protocol without intravenous contrast. Multiplanar CT image reconstructions of the cervical spine were also generated. COMPARISON:  MRI brain 11/03/2012 FINDINGS: CT  HEAD FINDINGS Brain: Mild diffuse cerebral atrophy. Ventricular dilatation consistent with central atrophy. Low-attenuation changes in the deep white matter consistent with small vessel ischemia. No evidence of acute infarction, hemorrhage, hydrocephalus, extra-axial collection or mass lesion/mass effect. Vascular: Moderate intracranial arterial vascular calcifications are present. Skull: Calvarium appears intact. Sinuses/Orbits: Paranasal sinuses and mastoid air cells are clear. Other: None. CT CERVICAL  SPINE FINDINGS Alignment: Normal alignment of the cervical vertebrae and facet joints. C1-2 articulation appears intact. Skull base and vertebrae: No vertebral compression deformities. No focal bone lesion or bone destruction. Soft tissues and spinal canal: No abnormal paraspinal soft tissue mass or infiltration. Vascular calcifications. Disc levels: Degenerative changes throughout the cervical spine with narrowed interspaces and endplate hypertrophic changes, most prominent at C5-6, C6-7 and C7-T1 levels. Degenerative changes in the facet joints. Upper chest: Emphysematous changes in the lung apices. Vascular calcifications. Other: None. IMPRESSION: 1. No acute intracranial abnormalities. Chronic atrophy and small vessel ischemic changes. 2. Normal alignment of the cervical spine. Degenerative changes. No acute displaced fractures identified. Electronically Signed   By: Lucienne Capers M.D.   On: 04/18/2018 00:18   Dg Humerus Right  Result Date: 04/18/2018 CLINICAL DATA:  Initial evaluation for acute trauma, fall. EXAM: RIGHT HUMERUS - 2+ VIEW COMPARISON:  None. FINDINGS: There is no evidence of fracture or other focal bone lesions. No acute soft tissue abnormality. Vascular calcifications noted within the axilla. Small peritoneal calcification noted a sent to the right humeral head. Atelectatic changes noted at the right lung base. IMPRESSION: No acute osseous abnormality about the right humerus.  Electronically Signed   By: Jeannine Boga M.D.   On: 04/18/2018 00:23   Dg Hand Complete Right  Result Date: 04/18/2018 CLINICAL DATA:  Initial evaluation for acute trauma, fall. EXAM: RIGHT HAND - COMPLETE 3+ VIEW COMPARISON:  None. FINDINGS: There is no evidence of fracture or dislocation. There is no evidence of arthropathy or other focal bone abnormality. Soft tissues are unremarkable. IMPRESSION: Negative. Electronically Signed   By: Jeannine Boga M.D.   On: 04/18/2018 00:26    Procedures Procedures (including critical care time)  Medications Ordered in ED Medications  acetaminophen (TYLENOL) tablet 1,000 mg (1,000 mg Oral Given 04/18/18 0006)      Final Clinical Impressions(s) / ED Diagnoses   Return for fevers >100.4 unrelieved by medication, shortness of breath, intractable vomiting, or diarrhea, Inability to tolerate liquids or food, cough, altered mental status or any concerns. No signs of systemic illness or infection. The patient is nontoxic-appearing on exam and vital signs are within normal limits.   I have reviewed the triage vital signs and the nursing notes. Pertinent labs &imaging results that were available during my care of the patient were reviewed by me and considered in my medical decision making (see chart for details).  After history, exam, and medical workup I feel the patient has been appropriately medically screened and is safe for discharge home. Pertinent diagnoses were discussed with the patient. Patient was given return precautions.   Bobbe Quilter, MD 04/18/18 1610

## 2018-04-20 NOTE — Telephone Encounter (Signed)
° ° °  Pt saying that [facility] states that they will need a new referral to continue to have an Aide to help with bathing. She said she is not able to bathe herself and need help.

## 2018-04-24 ENCOUNTER — Encounter: Payer: Self-pay | Admitting: Adult Health

## 2018-04-24 ENCOUNTER — Ambulatory Visit (INDEPENDENT_AMBULATORY_CARE_PROVIDER_SITE_OTHER): Payer: PPO | Admitting: Adult Health

## 2018-04-24 ENCOUNTER — Ambulatory Visit (HOSPITAL_BASED_OUTPATIENT_CLINIC_OR_DEPARTMENT_OTHER)
Admission: RE | Admit: 2018-04-24 | Discharge: 2018-04-24 | Disposition: A | Discharge status: Home / Self Care | Payer: PPO | Source: Ambulatory Visit | Attending: Adult Health | Admitting: Adult Health

## 2018-04-24 VITALS — BP 130/70 | HR 78 | Temp 98.8°F

## 2018-04-24 DIAGNOSIS — J984 Other disorders of lung: Secondary | ICD-10-CM | POA: Insufficient documentation

## 2018-04-24 DIAGNOSIS — J181 Lobar pneumonia, unspecified organism: Secondary | ICD-10-CM | POA: Diagnosis not present

## 2018-04-24 DIAGNOSIS — J9611 Chronic respiratory failure with hypoxia: Secondary | ICD-10-CM

## 2018-04-24 DIAGNOSIS — J439 Emphysema, unspecified: Secondary | ICD-10-CM | POA: Insufficient documentation

## 2018-04-24 DIAGNOSIS — J189 Pneumonia, unspecified organism: Secondary | ICD-10-CM

## 2018-04-24 DIAGNOSIS — J449 Chronic obstructive pulmonary disease, unspecified: Secondary | ICD-10-CM

## 2018-04-24 HISTORY — DX: Chronic respiratory failure with hypoxia: J96.11

## 2018-04-24 MED ORDER — ALBUTEROL SULFATE (2.5 MG/3ML) 0.083% IN NEBU: 2.5000 mg | INHALATION_SOLUTION | Freq: Four times a day (QID) | RESPIRATORY_TRACT | 12 refills | Status: AC | PRN

## 2018-04-24 NOTE — Assessment & Plan Note (Signed)
Right lower lobe pneumonia.  Clinically patient is improved.  Chest x-ray shows clearance.  No further imaging at this time.

## 2018-04-24 NOTE — Assessment & Plan Note (Signed)
Recent resolving exacerbation with pneumonia.  Patient is clinically improving.  Will continue on current regimen.  Patient declines a flu shot today would like to get it in a few weeks and her primary care physician.  Plan  Patient Instructions  Continue on Symbicort 2 puffs Twice daily, rinse after use.   Get flu shot later this month as you have planned.  Use Oxygen 2 l/m with activity as needed.  May use  Ipratropium /Albuterol Neb every 6hr as needed.  May use ProAir  Inhaler 2 puffs every 4hrs as needed for wheezing -this is your rescue medication.  Follow up Dr. Halford Chessman in 2 months and As needed  .  Please contact office for sooner follow up if symptoms do not improve or worsen or seek emergency care

## 2018-04-24 NOTE — Assessment & Plan Note (Signed)
Use oxygen 2 L as needed to keep O2 saturations greater than 90%.

## 2018-04-24 NOTE — Progress Notes (Signed)
@Patient  ID: Jenna Marquez, female    DOB: 12-08-1945, 72 y.o.   MRN: [id number]  Chief Complaint  Patient presents with  . Follow-up    COPD     Referring provider: Copland, Gay Filler, MD  HPI: 72 yo female with COPD -GOLD 3 , former smoker .   Significant tests/ events 09/2010 FeV1 49% Fef 25 75 22%  Ct chest 07/2012 - no nodules/copd changes  CTA Chest 08/2015 , neg PE, COPD changes  Spirometry 7/2017shows FEV1 46%, , ratio 35 , FVC 102%, mid flows 20%. PVX (2012) and Prevnar (2016)  utd.   04/24/2018 Follow up : COPD , PNA , O2 RF  Patient returns for a one-month follow-up.  Patient was seen last visit after recent hospitalization for right lower lobe pneumonia.  She was treated with antibiotics.  Patient had a follow-up CT chest done on 03/29/2018 that showed resolving right lower lobe airspace consolidation.  Patient says she is doing better.  She has had decreased cough and congestion.  She still has some thick intermittent mucus.  She does feel like that her breathing has improved and is back to her baseline.  Patient says in fact that she has not had to use her oxygen at home she is been monitoring her pulse oximetry on room air and is been above 90%.  Today in the office she is 97% on room air after walking from the waiting room to the exam room.  Patient denies any hemoptysis chest pain orthopnea PND or increased leg swelling. She has severe COPD.  Remains on Symbicort twice daily.  She uses DuoNeb as needed. Chest x-ray today show resolved pneumonia  Allergies  Allergen Reactions  . Avelox [Moxifloxacin Hcl In Nacl] Itching and Rash    Immunization History  Administered Date(s) Administered  . Influenza Split 07/14/2011, 07/24/2012, 04/29/2013  . Influenza,inj,Quad PF,6+ Mos 08/28/2014, 09/22/2015, 10/02/2016, 09/07/2017  . Pneumococcal Conjugate-13 10/30/2014  . Pneumococcal Polysaccharide-23 07/14/2011  . Zoster 03/23/2011    Past Medical History:  Diagnosis  Date  . Allergic rhinitis   . Anxiety    "occasionally"  . Asthma   . COPD (chronic obstructive pulmonary disease) ([place])   . GERD (gastroesophageal reflux disease)   . Mobitz type 2 second degree AV block 01/06/2015    Tobacco History: Social History   Tobacco Use  Smoking Status Former Smoker  . Packs/day: 1.00  . Years: 35.00  . Pack years: 35.00  . Types: Cigarettes  . Last attempt to quit: 08/10/2012  . Years since quitting: 5.7  Smokeless Tobacco Never Used  Tobacco Comment   Started at age 60   Counseling given: Not Answered Comment: Started at age 59   Outpatient Medications Prior to Visit  Medication Sig Dispense Refill  . albuterol (PROAIR HFA) 108 (90 Base) MCG/ACT inhaler Inhale 1-2 puffs into the lungs every 6 (six) hours as needed for wheezing or shortness of breath. 1 Inhaler 3  . ALPRAZolam (XANAX) 0.5 MG tablet Take 1 tablet (0.5 mg total) by mouth every 8 (eight) hours as needed for anxiety (**DO NOT TAKE AT NIGHT WITH HYDROXYZINE**). Do not take with alcohol 15 tablet 0  . aspirin EC 81 MG EC tablet Take 1 tablet (81 mg total) by mouth daily. 30 tablet 0  . budesonide-formoterol (SYMBICORT) 160-4.5 MCG/ACT inhaler Inhale 2 puffs into the lungs 2 (two) times daily. 3 Inhaler 1  . calcium-vitamin D (OSCAL WITH D) 250-125 MG-UNIT tablet Take 1 tablet by mouth  daily.    . Cholecalciferol (VITAMIN D) 2000 UNITS CAPS Take 1 capsule by mouth daily.    . cyproheptadine (PERIACTIN) 4 MG tablet Take 4 mg by mouth 2 (two) times daily.    . fluocinonide cream (LIDEX) 7.56 % Apply 1 application topically 2 (two) times daily.    . hydrOXYzine (ATARAX/VISTARIL) 10 MG tablet TAKE 2 TABLETS BY MOUTH AS NEEDED FOR ITCHING. DO NOT TAKE WITH ALPRAZOLAM 60 tablet 0  . ipratropium (ATROVENT) 0.02 % nebulizer solution Take 2.5 mLs (0.5 mg total) by nebulization every 6 (six) hours as needed for wheezing or shortness of breath. 75 mL 5  . metoprolol tartrate (LOPRESSOR) 25 MG tablet  Take 1 tablet (25 mg total) by mouth 2 (two) times daily. 180 tablet 2  . Multiple Vitamin (MULTIVITAMIN) tablet Take 2 tablets by mouth every morning.     . terbinafine (LAMISIL) 1 % cream Apply 1 application topically 2 (two) times daily. Use for foot itching for 4 weeks 42 g 0  . triamcinolone ointment (KENALOG) 0.1 % APPLY TO AFFECTED AREA TWICE A DAY  0   No facility-administered medications prior to visit.      Review of Systems  Constitutional:   No  weight loss, night sweats,  Fevers, chills,  +fatigue, or  lassitude.  HEENT:   No headaches,  Difficulty swallowing,  Tooth/dental problems, or  Sore throat,                No sneezing, itching, ear ache, nasal congestion, post nasal drip,   CV:  No chest pain,  Orthopnea, PND, swelling in lower extremities, anasarca, dizziness, palpitations, syncope.   GI  No heartburn, indigestion, abdominal pain, nausea, vomiting, diarrhea, change in bowel habits, loss of appetite, bloody stools.   Resp:.  No chest wall deformity  Skin: no rash or lesions.  GU: no dysuria, change in color of urine, no urgency or frequency.  No flank pain, no hematuria   MS:  No joint pain or swelling.  No decreased range of motion.  No back pain.    Physical Exam  BP 130/70   Pulse 78   Temp 98.8 F (37.1 C)   SpO2 97% Comment: on room air  GEN: A/Ox3; pleasant , NAD, frail and elderly   HEENT:  [place]/AT,  EACs-clear, TMs-wnl, NOSE-clear, THROAT-clear, no lesions, no postnasal drip or exudate noted.   NECK:  Supple w/ fair ROM; no JVD; normal carotid impulses w/o bruits; no thyromegaly or nodules palpated; no lymphadenopathy.    RESP few trace rhonchi no accessory muscle use, no dullness to percussion  CARD:  RRR, no m/r/g, trace peripheral edema, pulses intact, no cyanosis or clubbing.  GI:   Soft & nt; nml bowel sounds; no organomegaly or masses detected.   Musco: Warm bil, no deformities or joint swelling noted.   Neuro: alert, no focal  deficits noted.    Skin: Warm, no lesions or rashes    Lab Results:    BNP  Imaging: Dg Chest 2 View  Result Date: 04/24/2018 CLINICAL DATA:  72 year old diagnosed with RIGHT LOWER LOBE pneumonia in July, still complaining of malaise. Current history of emphysema and asthma. EXAM: CHEST - 2 VIEW COMPARISON:  CT chest 03/29/2018, 02/18/2018. Chest x-rays 12/19/2017 and earlier. FINDINGS: Cardiac silhouette normal in size, unchanged. LEFT subclavian dual lead transvenous pacemaker unchanged and appears intact. Thoracic aorta atherosclerotic. Hilar and mediastinal contours otherwise unremarkable. Stable linear scarring in the lower lobes. Lungs otherwise clear. Bronchovascular  markings normal. Pulmonary vascularity normal. No pleural effusions. Emphysematous changes in the UPPER lobes as noted previously. Degenerative changes involving the thoracic spine. IMPRESSION: 1.  No acute cardiopulmonary disease. 2. Stable scarring in the BILATERAL lower lobes. 3. Aortic Atherosclerosis (ICD10-I70.0) and Emphysema (ICD10-J43.9). Electronically Signed   By: Evangeline Dakin M.D.   On: 04/24/2018 14:46   Ct Head Wo Contrast  Result Date: 04/18/2018 CLINICAL DATA:  Fall tonight. Frontal hematoma. EXAM: CT HEAD WITHOUT CONTRAST CT CERVICAL SPINE WITHOUT CONTRAST TECHNIQUE: Multidetector CT imaging of the head and cervical spine was performed following the standard protocol without intravenous contrast. Multiplanar CT image reconstructions of the cervical spine were also generated. COMPARISON:  MRI brain 11/03/2012 FINDINGS: CT HEAD FINDINGS Brain: Mild diffuse cerebral atrophy. Ventricular dilatation consistent with central atrophy. Low-attenuation changes in the deep white matter consistent with small vessel ischemia. No evidence of acute infarction, hemorrhage, hydrocephalus, extra-axial collection or mass lesion/mass effect. Vascular: Moderate intracranial arterial vascular calcifications are present. Skull:  Calvarium appears intact. Sinuses/Orbits: Paranasal sinuses and mastoid air cells are clear. Other: None. CT CERVICAL SPINE FINDINGS Alignment: Normal alignment of the cervical vertebrae and facet joints. C1-2 articulation appears intact. Skull base and vertebrae: No vertebral compression deformities. No focal bone lesion or bone destruction. Soft tissues and spinal canal: No abnormal paraspinal soft tissue mass or infiltration. Vascular calcifications. Disc levels: Degenerative changes throughout the cervical spine with narrowed interspaces and endplate hypertrophic changes, most prominent at C5-6, C6-7 and C7-T1 levels. Degenerative changes in the facet joints. Upper chest: Emphysematous changes in the lung apices. Vascular calcifications. Other: None. IMPRESSION: 1. No acute intracranial abnormalities. Chronic atrophy and small vessel ischemic changes. 2. Normal alignment of the cervical spine. Degenerative changes. No acute displaced fractures identified. Electronically Signed   By: Lucienne Capers M.D.   On: 04/18/2018 00:18   Ct Chest Wo Contrast  Result Date: 03/30/2018 CLINICAL DATA:  Shortness of breath and cough. Patient diagnosed with pneumonia in July, 2019. EXAM: CT CHEST WITHOUT CONTRAST TECHNIQUE: Multidetector CT imaging of the chest was performed following the standard protocol without IV contrast. COMPARISON:  CT chest 02/18/2018 and 09/10/2015. PA and lateral chest 12/19/2017 and 08/30/2017. FINDINGS: Cardiovascular: Calcific aortic and coronary atherosclerosis is identified. Heart size is normal. No pericardial effusion. Pacing leads are noted. Mediastinum/Nodes: No enlarged mediastinal or axillary lymph nodes. Thyroid gland, trachea, and esophagus demonstrate no significant findings. Lungs/Pleura: No pleural effusion. The lungs are emphysematous. Airspace disease in the right lung base is predominantly linear in orientation. Aeration is improved since the most recent CT scan. The left lung  is clear. Upper Abdomen: No acute or focal abnormality. Musculoskeletal: No acute or focal abnormality. Mild convex right scoliosis and scattered spondylosis noted. IMPRESSION: Right lower lobe airspace disease has improved since the most recent comparison examination. Residual opacity could be due to atelectasis, scar or residual infection. Atelectasis and/or scar are favored favored. Recommend plain film follow-up in 6-8 weeks to ensure resolution or clearing. Calcific coronary artery disease. Aortic Atherosclerosis (ICD10-I70.0) and Emphysema (ICD10-J43.9). Electronically Signed   By: Inge Rise M.D.   On: 03/30/2018 08:06   Ct Cervical Spine Wo Contrast  Result Date: 04/18/2018 CLINICAL DATA:  Fall tonight. Frontal hematoma. EXAM: CT HEAD WITHOUT CONTRAST CT CERVICAL SPINE WITHOUT CONTRAST TECHNIQUE: Multidetector CT imaging of the head and cervical spine was performed following the standard protocol without intravenous contrast. Multiplanar CT image reconstructions of the cervical spine were also generated. COMPARISON:  MRI brain 11/03/2012 FINDINGS: CT  HEAD FINDINGS Brain: Mild diffuse cerebral atrophy. Ventricular dilatation consistent with central atrophy. Low-attenuation changes in the deep white matter consistent with small vessel ischemia. No evidence of acute infarction, hemorrhage, hydrocephalus, extra-axial collection or mass lesion/mass effect. Vascular: Moderate intracranial arterial vascular calcifications are present. Skull: Calvarium appears intact. Sinuses/Orbits: Paranasal sinuses and mastoid air cells are clear. Other: None. CT CERVICAL SPINE FINDINGS Alignment: Normal alignment of the cervical vertebrae and facet joints. C1-2 articulation appears intact. Skull base and vertebrae: No vertebral compression deformities. No focal bone lesion or bone destruction. Soft tissues and spinal canal: No abnormal paraspinal soft tissue mass or infiltration. Vascular calcifications. Disc levels:  Degenerative changes throughout the cervical spine with narrowed interspaces and endplate hypertrophic changes, most prominent at C5-6, C6-7 and C7-T1 levels. Degenerative changes in the facet joints. Upper chest: Emphysematous changes in the lung apices. Vascular calcifications. Other: None. IMPRESSION: 1. No acute intracranial abnormalities. Chronic atrophy and small vessel ischemic changes. 2. Normal alignment of the cervical spine. Degenerative changes. No acute displaced fractures identified. Electronically Signed   By: Lucienne Capers M.D.   On: 04/18/2018 00:18   Dg Humerus Right  Result Date: 04/18/2018 CLINICAL DATA:  Initial evaluation for acute trauma, fall. EXAM: RIGHT HUMERUS - 2+ VIEW COMPARISON:  None. FINDINGS: There is no evidence of fracture or other focal bone lesions. No acute soft tissue abnormality. Vascular calcifications noted within the axilla. Small peritoneal calcification noted a sent to the right humeral head. Atelectatic changes noted at the right lung base. IMPRESSION: No acute osseous abnormality about the right humerus. Electronically Signed   By: Jeannine Boga M.D.   On: 04/18/2018 00:23   Dg Hand Complete Right  Result Date: 04/18/2018 CLINICAL DATA:  Initial evaluation for acute trauma, fall. EXAM: RIGHT HAND - COMPLETE 3+ VIEW COMPARISON:  None. FINDINGS: There is no evidence of fracture or dislocation. There is no evidence of arthropathy or other focal bone abnormality. Soft tissues are unremarkable. IMPRESSION: Negative. Electronically Signed   By: Jeannine Boga M.D.   On: 04/18/2018 00:26     Assessment & Plan:   COPD (chronic obstructive pulmonary disease) gold stage C. Recent resolving exacerbation with pneumonia.  Patient is clinically improving.  Will continue on current regimen.  Patient declines a flu shot today would like to get it in a few weeks and her primary care physician.  Plan  Patient Instructions  Continue on Symbicort 2 puffs  Twice daily, rinse after use.   Get flu shot later this month as you have planned.  Use Oxygen 2 l/m with activity as needed.  May use  Ipratropium /Albuterol Neb every 6hr as needed.  May use ProAir  Inhaler 2 puffs every 4hrs as needed for wheezing -this is your rescue medication.  Follow up Dr. Halford Chessman in 2 months and As needed  .  Please contact office for sooner follow up if symptoms do not improve or worsen or seek emergency care         Community acquired pneumonia Right lower lobe pneumonia.  Clinically patient is improved.  Chest x-ray shows clearance.  No further imaging at this time.  Chronic respiratory failure with hypoxia (HCC) Use oxygen 2 L as needed to keep O2 saturations greater than 90%.     Rexene Edison, NP 04/24/2018

## 2018-04-24 NOTE — Patient Instructions (Addendum)
Continue on Symbicort 2 puffs Twice daily, rinse after use.  Chest xray today .  Get flu shot later this month as you have planned.  Use Oxygen 2 l/m with activity as needed.  May use  Ipratropium /Albuterol Neb every 6hr as needed.  May use ProAir  Inhaler 2 puffs every 4hrs as needed for wheezing -this is your rescue medication.  Follow up Dr. Halford Chessman in 2 months and As needed  .  Please contact office for sooner follow up if symptoms do not improve or worsen or seek emergency care

## 2018-04-27 ENCOUNTER — Telehealth: Payer: Self-pay | Admitting: Family Medicine

## 2018-04-27 NOTE — Telephone Encounter (Signed)
Copied from [place] [phone]. Topic: Quick Communication - See Telephone Encounter >> Apr 27, 2018 12:42 PM Gardiner Ramus wrote: CRM for notification. See Telephone encounter for: 04/27/18. Patty from [facility] called and stated that she can not locate patient. Patient is discharged from services. [phone]

## 2018-04-29 DIAGNOSIS — J449 Chronic obstructive pulmonary disease, unspecified: Secondary | ICD-10-CM | POA: Diagnosis not present

## 2018-05-01 ENCOUNTER — Other Ambulatory Visit: Payer: Self-pay

## 2018-05-01 DIAGNOSIS — L2089 Other atopic dermatitis: Secondary | ICD-10-CM | POA: Diagnosis not present

## 2018-05-01 DIAGNOSIS — Z79899 Other long term (current) drug therapy: Secondary | ICD-10-CM | POA: Diagnosis not present

## 2018-05-01 NOTE — Patient Outreach (Signed)
[facility]) Care Management  05/01/2018  Kathrynne Kulinski 1946-07-26 [id number]  TELEPHONE SCREENING Referral date: 04/18/18 Referral source: utilization management Referral reason: Assess for home needs Insurance: health team advantage  Telephone call to patient regarding referral. HIPAA verified with patient. Explained reason for call.  Patient states she has been having a difficult time this year.  Patient tearful during call.  Patient states she has fallen approximately 4 times within the past 3 months.  Patient states she did not hurt or break anything just sore. Patient denies walking with any ambulatory devices. 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  Patient states she had a follow up doctor visit  today and was told that her condition is not curable.  Patient states she would like to have a second opinion but she is unsure where to start to find another dermatologist.  Patient states she stays depressed.  Patient reports she was given salve for the eczema but states it the same salve that was given to her initially .  Patient states the doctor wants her to take a certain medication for the eczema.  Patient states she does not want to do that because it involves a lot.  Patient reports the doctor also wants to give her week shots for the medication.  Patient states, " I can't do that."  Patient reports she was in the hospital around the end of July 2019 and discharged on 03/25/18.  Patient states she had home health services with [facility].  Patient voiced several concerns regarding the care she received with [facility].  She reports she had to file a grievance with [facility].  Patient states she had an issue with her home health  services being discontinued because she was told her insurance would not continue to cover. Patient states she contacted her insurance and was informed that she still had ongoing  coverage for her services. Patient states her services with [facility] resumed and her last physical therapy visit was 04/20/18.  Patient states she has not had any additional therapy visits since then and states she was not told that her services had ended. Patient states she also had home health aid that assisted her with bathing. She reports this was discontinued as well. Patient states she is now having to pay someone to help her with her bath.  RNCM discussed and offered [facility] care management services to patient. Patient verbally agreed.   PLAN; RNCM will refer patient to social worker and community case manager  Quinn Plowman RN,BSN,CCM [facility] Telephonic  [phone]    :

## 2018-05-01 NOTE — Progress Notes (Signed)
Reviewed and agree with assessment/plan.   Vonda Harth, MD [place] [facility] 08/10/2016, 12:24 PM Pager:  [phone]  

## 2018-05-03 ENCOUNTER — Other Ambulatory Visit: Payer: Self-pay | Admitting: *Deleted

## 2018-05-03 ENCOUNTER — Encounter: Payer: Self-pay | Admitting: *Deleted

## 2018-05-03 NOTE — Patient Outreach (Signed)
[facility]) Care Management  05/03/2018  Truda Staub 10-05-1945 [id number]    RN spoke with pt today and explained the purpose for today's call. [facility] services introduced and review of the referral information discussed. Pt states she was on a long distance call and requested a call back. RN will follow up later today and inquire further on pt's needs.  Raina Mina, RN Care Management Coordinator [place] Office [phone]

## 2018-05-03 NOTE — Patient Outreach (Signed)
[facility]) Care Management  05/03/2018  Alailah Safley April 15, 1946 [id number]    RN returned call as requested and spoke with pt once again. RN inquired further on pt's needs as pt indicated she wanted permission to have a pedicure due to ongiong dermatitis. RN informed pt that this clearance needed to come from her provider. Pt states she will check with her doctor. Another area of concern involved pt requesting a second opinion concerning no cure for her ongoing dermatitis. RN offered to assist by contacting her provider for a requested referral if pt desired this option. RN inquired on all other needs related to the referral received "risk of falls" due to care coordination. Pt not sure and indicated she was on the phone and requested another call back on Monday. RN will follow up on Monday as requested. RN verified she has a pending visit with social worker on 9/25 for counseling. Will inquire more and offer a home visit if there are needs for this case manager to address at this time. Most of pt's request involved a provider's input but RN will attempt to address all her concerns and needs if possible.  Raina Mina, RN Care Management Coordinator [place] Office [phone]

## 2018-05-03 NOTE — Patient Outreach (Signed)
[facility]) Care Management  05/03/2018  Jenna Marquez 03-08-1946 [id number]   CSW was able to make initial contact with patient today to perform phone assessment, as well as assess and assist with social work needs and services.  CSW introduced self, explained role and types of services provided through [place] [facility] ([place] [facility]).  CSW further explained to patient that CSW works with patient's Telephonic RNCM, also with [facility], Quinn Plowman. CSW then explained the reason for the call, indicating that Jenna Marquez thought that patient would benefit from social work services and resources to assist with helping patient to better cope with symptoms of anxiety and depression.  CSW obtained two HIPAA compliant identifiers from patient, which included patient's name and date of birth. Patient reported that she remembers having worked with Jenna Marquez in the past, explaining that she wishes she had not cancelled the last home visit with CSW.  CSW voiced understanding, explaining to patient that CSW is more than happy to schedule another home visit with patient to provide counseling and supportive services.  Patient was very receptive, so the home visit has been scheduled for Wednesday, May 09, 2018 at 10:00AM.  CSW noted that patient has a follow-up appointment with her Primary Care Physician, Dr. Lamar Marquez, scheduled for Monday, May 07, 2018 at 10:00AM at [facility] [employer].  CSW encouraged patient to talk with Dr. Lorelei Marquez about possibly starting her on an antidepressant medication to try and help combat her symptoms of depression, as patient is currently only taking Xanax for anxiety.  Patient was agreeable to this plan. [facility] CM Care Plan Problem One     Most Recent Value  Care Plan Problem One  Patient is experiencing symptoms of anixety and depression.  Role Documenting the Problem One  Clinical  Social Worker  Care Plan for Problem One  Active  [facility] Long Term Goal   Patient will have a reduction in symptoms of anixety and depression, within the next 45 days, as a result of counseling and supportive services.  [facility] Long Term Goal Start Date  05/03/18  Interventions for Problem One Long Term Goal  CSW will provide counseling and supportive services to patient to help patient better cope with symptoms of anxiety and depression.  [facility] CM Short Term Goal #1   Patient will meet with CSW for an initial home visit to discuss symptoms of anxiety and depression so that CSW can provide counseling and supportive services, within the next week.  [facility] CM Short Term Goal #1 Start Date  05/03/18  Interventions for Short Term Goal #1  CSW has scheduled an initial home visit with patient for Wednesday, May 09, 2018 at 10:00AM.  [facility] CM Short Term Goal #2   Patient will review EMMI literature provided to her by CSW, within the next 30 days, pertaining specifically to anxiety and depression.  [facility] CM Short Term Goal #2 Start Date  05/03/18  Interventions for Short Term Goal #2  CSW will provide EMMI information to patient during the initial home visit and review material with patient to ensure understanding.    Jenna Marquez, BSW, MSW, LCSW  Licensed Education officer, [facility]  Mailing [place] [address], [place], [place] [address] Physical Address-[address], [place], [place] [address] Toll Free Main # [phone] Fax # [phone] Cell # [phone]  Office # [phone] [email or web address]

## 2018-05-04 ENCOUNTER — Ambulatory Visit: Payer: Self-pay | Admitting: *Deleted

## 2018-05-05 NOTE — Progress Notes (Signed)
[place] [facility] [address] [place], [place] [address] [phone]  Date:  05/07/2018   Name:  Jenna Marquez   DOB:  [birth year]   MRN:  [id number]  PCP:  Darreld Mclean, MD    Chief Complaint: Pneumonia (2 month follow up, finished with using oxygen and walker)   History of Present Illness:  Jenna Marquez is a 72 y.o. very pleasant female patient who presents with the following:  Following up today- last seen here in July following hospital admission for CAP.  At that time we encouraged her to stop drinking to hopefully reduce her depression.  We also had to arrange GYN follow-up due to PMB.   She did see Dr. Maylene Roes- [facility] in August to work- up the PMB- however it appears that all of her eval was normal and they were not sure that her bleeding was GYN in origin. She is to follow-up there soon  Per pulmonology note from earlier this month: 04/24/2018 Follow up : COPD , PNA , O2 RF  Patient returns for a one-month follow-up.  Patient was seen last visit after recent hospitalization for right lower lobe pneumonia.  She was treated with antibiotics.  Patient had a follow-up CT chest done on 03/29/2018 that showed resolving right lower lobe airspace consolidation.  Patient says she is doing better.  She has had decreased cough and congestion.  She still has some thick intermittent mucus.  She does feel like that her breathing has improved and is back to her baseline.  Patient says in fact that she has not had to use her oxygen at home she is been monitoring her pulse oximetry on room air and is been above 90%.  Today in the office she is 97% on room air after walking from the waiting room to the exam room.  Patient denies any hemoptysis chest pain orthopnea PND or increased leg swelling. She has severe COPD.  Remains on Symbicort twice daily.  She uses DuoNeb as needed. Chest x-ray today show resolved  pneumonia///////////////////////////////////////// COPD (chronic obstructive pulmonary disease) gold stage C. Recent resolving exacerbation with pneumonia.  Patient is clinically improving.  Will continue on current regimen.  Patient declines a flu shot today would like to get it in a few weeks and her primary care physician. Plan  Patient Instructions  Continue on Symbicort 2 puffs Twice daily, rinse after use.  Get flu shot later this month as you have planned.  Use Oxygen 2 l/m with activity as needed.  May use  Ipratropium /Albuterol Neb every 6hr as needed.  May use ProAir  Inhaler 2 puffs every 4hrs as needed for wheezing -this is your rescue medication.  Follow up Dr. Halford Chessman in 2 months and As needed  .   Flu: today  Tetanus: she thinks that she is due.  No recent wound however so medicare will not cover Shingrix:  She did have zostavax in 2012 Colonoscopy- done 5 years ago and she is due for a follow-up. She prefers to go to [place] for this, will refer her    [facility] has been calling her to offer support and discuss her depression ?would she be amenable to trying some medication for her depression at this time- no per pt   She notes that overall she is doing much better today  She was able to come off her oxygen- she is not using this even for activity now  She  is not using a walker either  She is seeing derm- they put her on methotrexate for her dermatitis which is helping her  She is not interested in medication for her depression at this time. She notes that her family is supportive of her- her most distant relatives  Alcohol- she is not drinking at all per her report.  However then goes back and states that she is still drinking wine some of the time but not homemade wine  Patient Active Problem List   Diagnosis Date Noted  . Chronic respiratory failure with hypoxia ([place]) 04/24/2018  . MDD (major depressive disorder), single episode, severe , no psychosis ([place])   . Hemoptysis  02/19/2018  . Community acquired pneumonia 02/19/2018  . Vagina bleeding 02/19/2018  . Alcohol abuse 02/19/2018  . Left knee pain 07/11/2017  . PVD (peripheral vascular disease) ([place]) 04/04/2017  . HTN (hypertension) 11/24/2016  . Paroxysmal atrial fibrillation ([place]) 09/30/2016  . Mobitz type 2 second degree AV block 09/30/2016  . Swelling of arm 05/22/2015  . Left shoulder pain 05/22/2015  . CKD (chronic kidney disease) stage 3, GFR 30-59 ml/min (H[facility]) 05/11/2015  . Pacemaker 04/22/2015  . Generalized anxiety disorder 01/11/2012  . 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   . Tobacco abuse     Past Medical History:  Diagnosis Date  . Allergic rhinitis   . Anxiety    "occasionally"  . Asthma   . COPD (chronic obstructive pulmonary disease) ([place])   . GERD (gastroesophageal reflux disease)   . Mobitz type 2 second degree AV block 01/06/2015    Past Surgical History:  Procedure Laterality Date  . [place]  . EP IMPLANTABLE DEVICE N/A 01/07/2015   Procedure: Pacemaker Implant;  Surgeon: Evans Lance, MD;  Location: [place] [facility];  Service: Cardiovascular;  Laterality: N/A;  . PACEMAKER INSERTION    . TONSILLECTOMY AND ADENOIDECTOMY  1954    Social History   Tobacco Use  . Smoking status: Former Smoker    Packs/day: 1.00    Years: 35.00    Pack years: 35.00    Types: Cigarettes    Last attempt to quit: 08/10/2012    Years since quitting: 5.7  . Smokeless tobacco: Never Used  . Tobacco comment: Started at age 6  Substance Use Topics  . Alcohol use: Yes    Alcohol/week: 28.0 standard drinks    Types: 28 Shots of liquor per week    Comment: 01/06/2015 "2 shot mixed drinks, 2/day"  . Drug use: No    Family History  Problem Relation Age of Onset  . Coronary artery disease Mother   . Heart disease Mother        CHF  . Diabetes Neg Hx   . Cancer Neg Hx   . Stroke Neg Hx   . Colon cancer Neg Hx     Allergies   Allergen Reactions  . Avelox [Moxifloxacin Hcl In Nacl] Itching and Rash    Medication list has been reviewed and updated.  Current Outpatient Medications on File Prior to Visit  Medication Sig Dispense Refill  . albuterol (PROAIR HFA) 108 (90 Base) MCG/ACT inhaler Inhale 1-2 puffs into the lungs every 6 (six) hours as needed for wheezing or shortness of breath. 1 Inhaler 3  . albuterol (PROVENTIL) (2.5 MG/3ML) 0.083% nebulizer solution Take 3 mLs (2.5 mg total) by nebulization every 6 (six) hours as needed for wheezing or shortness of breath. 75 mL 12  .  ALPRAZolam (XANAX) 0.5 MG tablet Take 1 tablet (0.5 mg total) by mouth every 8 (eight) hours as needed for anxiety (**DO NOT TAKE AT NIGHT WITH HYDROXYZINE**). Do not take with alcohol 15 tablet 0  . aspirin EC 81 MG EC tablet Take 1 tablet (81 mg total) by mouth daily. 30 tablet 0  . budesonide-formoterol (SYMBICORT) 160-4.5 MCG/ACT inhaler Inhale 2 puffs into the lungs 2 (two) times daily. 3 Inhaler 1  . calcium-vitamin D (OSCAL WITH D) 250-125 MG-UNIT tablet Take 1 tablet by mouth daily.    . Cholecalciferol (VITAMIN D) 2000 UNITS CAPS Take 1 capsule by mouth daily.    . cyproheptadine (PERIACTIN) 4 MG tablet Take 4 mg by mouth 2 (two) times daily.    . fluocinonide cream (LIDEX) 1.47 % Apply 1 application topically 2 (two) times daily.    . folic acid (FOLVITE) 1 MG tablet Take 1 mg by mouth daily.  11  . hydrOXYzine (ATARAX/VISTARIL) 10 MG tablet TAKE 2 TABLETS BY MOUTH AS NEEDED FOR ITCHING. DO NOT TAKE WITH ALPRAZOLAM 60 tablet 0  . ipratropium (ATROVENT) 0.02 % nebulizer solution Take 2.5 mLs (0.5 mg total) by nebulization every 6 (six) hours as needed for wheezing or shortness of breath. 75 mL 5  . methotrexate (RHEUMATREX) 2.5 MG tablet Take 2.5 mg by mouth.  0  . Multiple Vitamin (MULTIVITAMIN) tablet Take 2 tablets by mouth every morning.     . triamcinolone ointment (KENALOG) 0.1 % APPLY TO AFFECTED AREA TWICE A DAY  0  .  metoprolol tartrate (LOPRESSOR) 25 MG tablet Take 1 tablet (25 mg total) by mouth 2 (two) times daily. 180 tablet 2   No current facility-administered medications on file prior to visit.     Review of Systems:  As per HPI- otherwise negative.   Physical Examination: Vitals:   05/07/18 1008  BP: 124/72  Pulse: 92  Resp: 18  Temp: 98 F (36.7 C)  SpO2: 96%   Vitals:   05/07/18 1008  Weight: 133 lb (60.3 kg)  Height: 5\' 6"  (1.676 m)   Body mass index is 21.47 kg/m. Ideal Body Weight: Weight in (lb) to have BMI = 25: 154.6  GEN: WDWN, NAD, Non-toxic, A & O x 3, looks well and seems in good spirits today  HEENT: Atraumatic, Normocephalic. Neck supple. No masses, No LAD. Ears and Nose: No external deformity. CV: RRR, No M/G/R. No JVD. No thrill. No extra heart sounds. PULM: CTA B, no wheezes, crackles, rhonchi. No retractions. No resp. distress. No accessory muscle use. EXTR: No c/c/e NEURO Normal gait.  PSYCH: Normally interactive. Conversant. Not depressed or anxious appearing.  Calm demeanor.    Assessment and Plan: Physical debility  Alcohol abuse - Plan: CANCELED: CBC, CANCELED: Comprehensive metabolic panel  Chronic obstructive pulmonary disease, unspecified COPD type ([place])  Generalized anxiety disorder  Needs flu shot - Plan: Flu vaccine HIGH DOSE PF (Fluzone High dose)  Encounter for screening colonoscopy - Plan: Ambulatory referral to Gastroenterology  Following up today Overall she is doing better and feeling stronger Off oxygen She is still drinking on further discussion today- encouraged her to stop. Due to history of problem drinking would discourage even "light" drinking for her  Flu shot given Had planned to check a CBC and CMP today but pt declines, she is having a liver panel apparently per derm tomorrow and does not want [place] to draw her blood as they use a "baby" needle that she prefers Asked her to  come back and see me in 4 months, can do labs  then  Referral back to GI for colonoscopy which is due   Signed Lamar Blinks, MD

## 2018-05-07 ENCOUNTER — Encounter: Payer: Self-pay | Admitting: Family Medicine

## 2018-05-07 ENCOUNTER — Other Ambulatory Visit: Payer: Self-pay | Admitting: *Deleted

## 2018-05-07 ENCOUNTER — Ambulatory Visit (INDEPENDENT_AMBULATORY_CARE_PROVIDER_SITE_OTHER): Payer: PPO | Admitting: Family Medicine

## 2018-05-07 VITALS — BP 124/72 | HR 92 | Temp 98.0°F | Resp 18 | Ht 66.0 in | Wt 133.0 lb

## 2018-05-07 DIAGNOSIS — Z1211 Encounter for screening for malignant neoplasm of colon: Secondary | ICD-10-CM | POA: Diagnosis not present

## 2018-05-07 DIAGNOSIS — J449 Chronic obstructive pulmonary disease, unspecified: Secondary | ICD-10-CM | POA: Diagnosis not present

## 2018-05-07 DIAGNOSIS — Z23 Encounter for immunization: Secondary | ICD-10-CM

## 2018-05-07 DIAGNOSIS — R5381 Other malaise: Secondary | ICD-10-CM

## 2018-05-07 DIAGNOSIS — F1010 Alcohol abuse, uncomplicated: Secondary | ICD-10-CM | POA: Diagnosis not present

## 2018-05-07 DIAGNOSIS — F411 Generalized anxiety disorder: Secondary | ICD-10-CM | POA: Diagnosis not present

## 2018-05-07 NOTE — Patient Outreach (Signed)
[facility]) Care Management  05/07/2018  Jenna Marquez 02-May-1946 [id number]    Unsuccessful attempt to reach pt however able to leave a HIPAA approved voice message requesting a call back. Will scheduled another outreach call for pending [facility] services over the next week.  Raina Mina, RN Care Management Coordinator [place] Office [phone]

## 2018-05-07 NOTE — Patient Instructions (Addendum)
Good to see you today- I am glad that you are feeling better You got your flu shot today  You are also due for a tetanus shot and should have a booster if you have any significant wound or cut You can also have the shingles vaccine- Shingrix- done at your drug store at your convenience if you wish  I will refer you back to [place] for your colonoscopy I would encourage you to avoid all alcohol due to your history of problem drinking   Please have your blood drawn today-  I will be in touch with your results asap  Please plan to see me in 4 months for a recheck

## 2018-05-09 ENCOUNTER — Other Ambulatory Visit: Payer: Self-pay | Admitting: *Deleted

## 2018-05-09 NOTE — Patient Outreach (Signed)
[facility]) Care Management  05/09/2018  Aliza Moret July 27, 1946 [id number]  CSW drove out to patient's home for the initial home visit today; however, patient was not available at the time of CSW's arrival.  CSW knocked on the door, on several different occasions, as well as tried to call patient by phone, without success.  CSW left a HIPAA compliant message for patient on voicemail and is currently awaiting a return call. Nat Christen, BSW, MSW, LCSW  Licensed Education officer, [facility]  Mailing [place] [address] [place], [place] [address] Physical Address-[address], [place], [place] [address] Toll Free Main # [phone] Fax # [phone] Cell # [phone]  Office # [phone] [email or web address]

## 2018-05-09 NOTE — Patient Outreach (Addendum)
[facility]) Care Management  05/09/2018  Shauntavia Brackin Jan 13, 1946 [id number]    RN spoke briefly with pt today (pt receptive). RN reintroduced the purpose for today's call and again attempted to assess pt's needs. Pt started off indicated the [facility] social worker did not show today and she has called the office several times. Pt was informed the social worker would be notified. RN offered to further assist at this time with any immediate needs however pt declined indicated none. RN further offered to engage with a scheduled home visit. Initially pt decline and continue to to discuss her needs with needed someone to assist her getting in an out of the "tub". Pt verified her income is not over$1100 but did not provider any other details but states she did not qualify for MCD. Based upon this information RN further offered to provider agencies in her area for aide services. Pt informed there maybe a fee incur for her requested services. Pt with understanding and indicated she had someone that was of a lower cost that she can use to assist her further. RN continue to discuss case management services related to any other needs. Pt's response was to come to her home and evaluate. RN offered a home visit as pt requested next Wednesday at 2 pm. RN will visit pt's home next week as scheduled to further assist pt's ongoing needs and generate plan of care based upon the information obtained at that time.   Raina Mina, RN Care Management Coordinator [place] Office [phone]

## 2018-05-11 ENCOUNTER — Telehealth: Payer: Self-pay | Admitting: Family Medicine

## 2018-05-11 ENCOUNTER — Encounter: Payer: Self-pay | Admitting: Internal Medicine

## 2018-05-11 ENCOUNTER — Ambulatory Visit (INDEPENDENT_AMBULATORY_CARE_PROVIDER_SITE_OTHER): Payer: PPO | Admitting: Internal Medicine

## 2018-05-11 VITALS — BP 152/90 | HR 88 | Temp 97.4°F | Resp 16 | Ht 66.0 in | Wt 132.2 lb

## 2018-05-11 DIAGNOSIS — B029 Zoster without complications: Secondary | ICD-10-CM

## 2018-05-11 DIAGNOSIS — R030 Elevated blood-pressure reading, without diagnosis of hypertension: Secondary | ICD-10-CM | POA: Diagnosis not present

## 2018-05-11 MED ORDER — ACYCLOVIR 5 % EX OINT: 1.0000 "application " | TOPICAL_OINTMENT | Freq: Three times a day (TID) | CUTANEOUS | 0 refills | Status: AC

## 2018-05-11 MED ORDER — VALACYCLOVIR HCL 1 G PO TABS: 1000.0000 mg | ORAL_TABLET | Freq: Three times a day (TID) | ORAL | 0 refills | Status: DC

## 2018-05-11 MED ORDER — CEPHALEXIN 500 MG PO CAPS: 500.0000 mg | ORAL_CAPSULE | Freq: Four times a day (QID) | ORAL | 0 refills | Status: DC

## 2018-05-11 NOTE — Progress Notes (Signed)
Pre visit review using our clinic review tool, if applicable. No additional management support is needed unless otherwise documented below in the visit note. 

## 2018-05-11 NOTE — Telephone Encounter (Signed)
Copied from [place] [id number]. Topic: Quick Communication - Rx Refill/Question >> May 11, 2018  4:08 PM Reyne Dumas L wrote: Medication:  acyclovir ointment (ZOVIRAX) 5 % - states that this medication is going to be too much  Pt wants to know if she can take doxycline instead.  Has the patient contacted their pharmacy? no (Agent: If no, request that the patient contact the pharmacy for the refill.) (Agent: If yes, when and what did the pharmacy advise?)  Preferred Pharmacy (with phone number or street name): [id number] [place], [place] [address] AT [place] [phone] (Phone) [phone] (Fax)  Agent: Please be advised that RX refills may take up to 3 business days. We ask that you follow-up with your pharmacy.

## 2018-05-11 NOTE — Progress Notes (Signed)
Subjective:    Patient ID: Jenna Marquez, female    DOB: 1946/07/07, 72 y.o.   MRN: [id number]  DOS:  05/11/2018 Type of visit - description : acute Interval history: Acute onset of a rash on the left side of the scalp 4 days ago, shortly after they started "scarring". BP noted to be slightly elevated today.  BP Readings from Last 3 Encounters:  05/11/18 (!) 152/90  05/07/18 124/72  04/24/18 130/70    Review of Systems No fever, chills. The area of rash does not hurt or itch.   Past Medical History:  Diagnosis Date  . Allergic rhinitis   . Anxiety    "occasionally"  . Asthma   . COPD (chronic obstructive pulmonary disease) ([place])   . GERD (gastroesophageal reflux disease)   . Mobitz type 2 second degree AV block 01/06/2015    Past Surgical History:  Procedure Laterality Date  . [place]  . EP IMPLANTABLE DEVICE N/A 01/07/2015   Procedure: Pacemaker Implant;  Surgeon: Evans Lance, MD;  Location: [place] [facility];  Service: Cardiovascular;  Laterality: N/A;  . PACEMAKER INSERTION    . TONSILLECTOMY AND ADENOIDECTOMY  1954    Social History   Socioeconomic History  . Marital status: Widowed    Spouse name: Not on file  . Number of children: 1  . Years of education: Not on file  . Highest education level: Not on file  Occupational History  . Occupation: Retired    Fish farm manager: [employer]    Comment: Production manager  Social Needs  . Financial resource strain: Not on file  . Food insecurity:    Worry: Not on file    Inability: Not on file  . Transportation needs:    Medical: Not on file    Non-medical: Not on file  Tobacco Use  . Smoking status: Former Smoker    Packs/day: 1.00    Years: 35.00    Pack years: 35.00    Types: Cigarettes    Last attempt to quit: 08/10/2012    Years since quitting: 5.7  . Smokeless tobacco: Never Used  . Tobacco comment: Started at age 61  Substance and Sexual Activity  . Alcohol use: Yes   Alcohol/week: 28.0 standard drinks    Types: 28 Shots of liquor per week    Comment: 01/06/2015 "2 shot mixed drinks, 2/day"  . Drug use: No  . Sexual activity: Not Currently    Birth control/protection: Post-menopausal  Lifestyle  . Physical activity:    Days per week: Not on file    Minutes per session: Not on file  . Stress: Not on file  Relationships  . Social connections:    Talks on phone: Not on file    Gets together: Not on file    Attends religious service: Not on file    Active member of club or organization: Not on file    Attends meetings of clubs or organizations: Not on file    Relationship status: Not on file  . Intimate partner violence:    Fear of current or ex partner: Not on file    Emotionally abused: Not on file    Physically abused: Not on file    Forced sexual activity: Not on file  Other Topics Concern  . Not on file  Social History Narrative   Lost husband in 12-2011, she lost her sister in 2012, brother in July 2013. She lives by herself, God son stayed  with her. She retired as Radio producer.      Allergies as of 05/11/2018      Reactions   Avelox [moxifloxacin Hcl In Nacl] Itching, Rash      Medication List        Accurate as of 05/11/18 11:59 PM. Always use your most recent med list.          acyclovir ointment 5 % Commonly known as:  ZOVIRAX Apply 1 application topically 3 (three) times daily.   albuterol 108 (90 Base) MCG/ACT inhaler Commonly known as:  PROVENTIL HFA;VENTOLIN HFA Inhale 1-2 puffs into the lungs every 6 (six) hours as needed for wheezing or shortness of breath.   albuterol (2.5 MG/3ML) 0.083% nebulizer solution Commonly known as:  PROVENTIL Take 3 mLs (2.5 mg total) by nebulization every 6 (six) hours as needed for wheezing or shortness of breath.   ALPRAZolam 0.5 MG tablet Commonly known as:  XANAX Take 1 tablet (0.5 mg total) by mouth every 8 (eight) hours as needed for anxiety (**DO NOT TAKE AT NIGHT WITH  HYDROXYZINE**). Do not take with alcohol   aspirin 81 MG EC tablet Take 1 tablet (81 mg total) by mouth daily.   budesonide-formoterol 160-4.5 MCG/ACT inhaler Commonly known as:  SYMBICORT Inhale 2 puffs into the lungs 2 (two) times daily.   calcium-vitamin D 250-125 MG-UNIT tablet Commonly known as:  OSCAL WITH D Take 1 tablet by mouth daily.   cephALEXin 500 MG capsule Commonly known as:  KEFLEX Take 1 capsule (500 mg total) by mouth 4 (four) times daily.   cyproheptadine 4 MG tablet Commonly known as:  PERIACTIN Take 4 mg by mouth 2 (two) times daily.   fluocinonide cream 0.05 % Commonly known as:  LIDEX Apply 1 application topically 2 (two) times daily.   folic acid 1 MG tablet Commonly known as:  FOLVITE Take 1 mg by mouth daily.   hydrOXYzine 10 MG tablet Commonly known as:  ATARAX/VISTARIL TAKE 2 TABLETS BY MOUTH AS NEEDED FOR ITCHING. DO NOT TAKE WITH ALPRAZOLAM   ipratropium 0.02 % nebulizer solution Commonly known as:  ATROVENT Take 2.5 mLs (0.5 mg total) by nebulization every 6 (six) hours as needed for wheezing or shortness of breath.   methotrexate 2.5 MG tablet Commonly known as:  RHEUMATREX Take 2.5 mg by mouth.   metoprolol tartrate 25 MG tablet Commonly known as:  LOPRESSOR Take 1 tablet (25 mg total) by mouth 2 (two) times daily.   multivitamin tablet Take 2 tablets by mouth every morning.   triamcinolone ointment 0.1 % Commonly known as:  KENALOG APPLY TO AFFECTED AREA TWICE A DAY   valACYclovir 1000 MG tablet Commonly known as:  VALTREX Take 1 tablet (1,000 mg total) by mouth 3 (three) times daily.   Vitamin D 2000 units Caps Take 1 capsule by mouth daily.          Objective:   Physical Exam  HENT:  Head:     BP (!) 152/90 (BP Location: Right Arm, Patient Position: Sitting, Cuff Size: Small)   Pulse 88   Temp (!) 97.4 F (36.3 C) (Oral)   Resp 16   Ht 5\' 6"  (1.676 m)   Wt 132 lb 4 oz (60 kg)   BMI 21.35 kg/m  General:    Well developed, NAD, see BMI.  HEENT:  Normocephalic . Face symmetric, atraumatic Neck: No unusual lymphadenopathies Neurologic:  alert & oriented X3.  Speech normal, gait appropriate for age and unassisted Psych--  Cognition and judgment appear intact.  Cooperative with normal attention span and concentration.  Behavior appropriate. No anxious or depressed appearing.        Assessment & Plan:   72 year old female, PMH includes COPD asthma, GERD, anxiety, Mobitz type II, eczema (to start MTX soon per pt) presents with:   Shingles : Despite the lack of pain, given that she has blisters in a dermatomal location (C2-3) my diagnosis is shingles. The patient was very disturbed by the diagnosis, states she had a vaccination.  I explained her to the best of my ability that the vaccination decrease the chances of having the illness but does not eliminate the chance.  The fact that she is not hurting, a major problem with shingles, may have to do with the fact that she got vaccinated. Recommend Valtrex for 1 week, local care with acyclovir cream. I wonder about a staph infection because she has some golden crusting.  Rx Keflex. Call if no better. Call if she has anything near her eyes. Hold MTX until better  Elevated BP: BP today is elevated, she is also very stressed about it, counseling, recommend to check at home.  Today, I spent more than  25  min with the patient: >50% of the time counseling regards new dx shingles, she was very distressed, multiple questions about the previous shingles vaccine

## 2018-05-11 NOTE — Telephone Encounter (Signed)
Acyclovir order pended for Dr. Lillie Fragmin review.

## 2018-05-11 NOTE — Telephone Encounter (Signed)
Copied from [place] [id number]. Topic: Quick Communication - Rx Refill/Question >> May 11, 2018  4:08 PM Reyne Dumas L wrote: Medication:  acyclovir ointment (ZOVIRAX) 5 % - states that this medication is going to be too much  Pt wants to know if she can take doxycline instead.  Has the patient contacted their pharmacy? no (Agent: If no, request that the patient contact the pharmacy for the refill.) (Agent: If yes, when and what did the pharmacy advise?)  Preferred Pharmacy (with phone number or street name): [id number] [place], [place] [address] AT [place] [phone] (Phone) [phone] (Fax)  Agent: Please be advised that RX refills may take up to 3 business days. We ask that you follow-up with your pharmacy.

## 2018-05-11 NOTE — Telephone Encounter (Signed)
Called her back- since she is on valtrex she does not have to use the acyclovir cream as well  She states understanding

## 2018-05-11 NOTE — Patient Instructions (Addendum)
Take the medications as prescribed: Valtrex, cephalexin and a cream.  Call if you are not gradually improving  Call anytime if you have a rash near your face or eye.  Check the  blood pressure 2 or 3 times a   week, Be sure your blood pressure is between 110/65 and  145/85.  if it is consistently higher or lower, let me know  Do not start methotrexate until you are completely better     Shingles Shingles, which is also known as herpes zoster, is an infection that causes a painful skin rash and fluid-filled blisters. Shingles is not related to genital herpes, which is a sexually transmitted infection. Shingles only develops in people who:  Have had chickenpox.  Have received the chickenpox vaccine. (This is rare.)  What are the causes? Shingles is caused by varicella-zoster virus (VZV). This is the same virus that causes chickenpox. After exposure to VZV, the virus stays in the body in an inactive (dormant) state. Shingles develops if the virus reactivates. This can happen many years after the initial exposure to VZV. It is not known what causes this virus to reactivate. What increases the risk? People who have had chickenpox or received the chickenpox vaccine are at risk for shingles. Infection is more common in people who:  Are older than age 14.  Have a weakened defense (immune) system, such as those with HIV, AIDS, or cancer.  Are taking medicines that weaken the immune system, such as transplant medicines.  Are under great stress.  What are the signs or symptoms? Early symptoms of this condition include itching, tingling, and pain in an area on your skin. Pain may be described as burning, stabbing, or throbbing. A few days or weeks after symptoms start, a painful red rash appears, usually on one side of the body in a bandlike or beltlike pattern. The rash eventually turns into fluid-filled blisters that break open, scab over, and dry up in about 2-3 weeks. At any time during  the infection, you may also develop:  A fever.  Chills.  A headache.  An upset stomach.  How is this diagnosed? This condition is diagnosed with a skin exam. Sometimes, skin or fluid samples are taken from the blisters before a diagnosis is made. These samples are examined under a microscope or sent to a lab for testing. How is this treated? There is no specific cure for this condition. Your health care provider will probably prescribe medicines to help you manage pain, recover more quickly, and avoid long-term problems. Medicines may include:  Antiviral drugs.  Anti-inflammatory drugs.  Pain medicines.  If the area involved is on your face, you may be referred to a specialist, such as an eye doctor (ophthalmologist) or an ear, nose, and throat (ENT) doctor to help you avoid eye problems, chronic pain, or disability. Follow these instructions at home: Medicines  Take medicines only as directed by your health care provider.  Apply an anti-itch or numbing cream to the affected area as directed by your health care provider. Blister and Rash Care  Take a cool bath or apply cool compresses to the area of the rash or blisters as directed by your health care provider. This may help with pain and itching.  Keep your rash covered with a loose bandage (dressing). Wear loose-fitting clothing to help ease the pain of material rubbing against the rash.  Keep your rash and blisters clean with mild soap and cool water or as directed by your health  care provider.  Check your rash every day for signs of infection. These include redness, swelling, and pain that lasts or increases.  Do not pick your blisters.  Do not scratch your rash. General instructions  Rest as directed by your health care provider.  Keep all follow-up visits as directed by your health care provider. This is important.  Until your blisters scab over, your infection can cause chickenpox in people who have never had it  or been vaccinated against it. To prevent this from happening, avoid contact with other people, especially: ? Babies. ? Pregnant women. ? Children who have eczema. ? Elderly people who have transplants. ? People who have chronic illnesses, such as leukemia or AIDS. Contact a health care provider if:  Your pain is not relieved with prescribed medicines.  Your pain does not get better after the rash heals.  Your rash looks infected. Signs of infection include redness, swelling, and pain that lasts or increases. Get help right away if:  The rash is on your face or nose.  You have facial pain, pain around your eye area, or loss of feeling on one side of your face.  You have ear pain or you have ringing in your ear.  You have loss of taste.  Your condition gets worse. This information is not intended to replace advice given to you by your health care provider. Make sure you discuss any questions you have with your health care provider. Document Released: 08/01/2005 Document Revised: 03/27/2016 Document Reviewed: 06/12/2014 Elsevier Interactive Patient Education  2018 [employer].

## 2018-05-16 ENCOUNTER — Other Ambulatory Visit: Payer: Self-pay | Admitting: *Deleted

## 2018-05-16 ENCOUNTER — Encounter: Payer: Self-pay | Admitting: *Deleted

## 2018-05-16 NOTE — Patient Outreach (Addendum)
[facility]) Care Management  05/16/2018  Jenna Marquez [birth year] [id number]   CSW was able to make contact with patient today to explain that [facility] has an opening for a home visit on Thursday, May 17, 2018 at 9:00AM and would patient like to take this appointment time.  Patient agreed; therefore, CSW will plan to meet with patient for the initial home visit on tomorrow. THN CM Care Plan Problem One     Most Recent Value  Care Plan Problem One  Patient is experiencing symptoms of anixety and depression.  Role Documenting the Problem One  Clinical Social Worker  Care Plan for Problem One  Active  [facility] Long Term Goal   Patient will have a reduction in symptoms of anixety and depression, within the next 45 days, as a result of counseling and supportive services.  THN Long Term Goal Start Date  05/03/18  [facility] CM Short Term Goal #1   Patient will meet with CSW for an initial home visit to discuss symptoms of anxiety and depression so that CSW can provide counseling and supportive services, within the next week.  THN CM Short Term Goal #1 Start Date  05/03/18  [facility] CM Short Term Goal #2   Patient will review EMMI literature provided to her by CSW, within the next 30 days, pertaining specifically to anxiety and depression.  THN CM Short Term Goal #2 Start Date  05/03/18    Nat Christen, BSW, MSW, [place]  Licensed Clinical Social Worker  [facility]  Mailing [place]. [address], [place], [place] [address] Physical Address-[address], [place], [place] [address] Toll Free Main # [phone] Fax # [phone] Cell # [phone]  Office # [phone] [email or web address]

## 2018-05-16 NOTE — Patient Outreach (Signed)
[facility]) Care Management   05/16/2018  Jenna Marquez 1945/09/30 [id number]  Jenna Marquez is an 72 y.o. female  Subjective:  FALLS: Pt reports her history of falls and indicated she lost her balance on the last fall with no major injuries but pt visited the ED.  Pt receptive to assessment of her home to view any possible risk of danger zones that may need adjusted or removed for ongoing passage ways through the home.  SKIN: Pt reports her ongoing dermatis eczema along with shingles located to the back of her head on the left side. Reports her provider is aware and she is taking medications as prescribed but the itching continues. Pt states she uses a sock to avoid breaking the skin and spreading this to other parts of her body. States this condition is behind her ear and irritates it more when her glasses sits on her left ear.  Objective:   Review of Systems  Constitutional: Negative.   HENT: Negative.   Eyes: Negative.   Respiratory: Negative.   Cardiovascular: Negative.   Gastrointestinal: Negative.   Genitourinary: Negative.   Musculoskeletal: Negative.   Skin: Positive for rash.  Neurological: Negative.   Endo/Heme/Allergies: Negative.   Psychiatric/Behavioral: Negative.     Physical Exam  Constitutional: She is oriented to person, place, and time. She appears well-developed and well-nourished.  HENT:  Right Ear: External ear normal.  Left Ear: External ear normal.  Eyes: EOM are normal.  Neck: Normal range of motion.  Cardiovascular: Normal heart sounds.  Respiratory: Effort normal and breath sounds normal.  GI: Soft. Bowel sounds are normal.  Musculoskeletal: Normal range of motion.  Neurological: She is alert and oriented to person, place, and time.  Skin: Skin is warm and dry.  Psychiatric: She has a normal mood and affect. Her behavior is normal. Judgment and thought content normal.    Encounter Medications:   Outpatient Encounter  Medications as of 05/16/2018  Medication Sig  . acyclovir ointment (ZOVIRAX) 5 % Apply 1 application topically 3 (three) times daily.  Marland Kitchen albuterol (PROAIR HFA) 108 (90 Base) MCG/ACT inhaler Inhale 1-2 puffs into the lungs every 6 (six) hours as needed for wheezing or shortness of breath.  Marland Kitchen albuterol (PROVENTIL) (2.5 MG/3ML) 0.083% nebulizer solution Take 3 mLs (2.5 mg total) by nebulization every 6 (six) hours as needed for wheezing or shortness of breath.  . ALPRAZolam (XANAX) 0.5 MG tablet Take 1 tablet (0.5 mg total) by mouth every 8 (eight) hours as needed for anxiety (**DO NOT TAKE AT NIGHT WITH HYDROXYZINE**). Do not take with alcohol  . aspirin EC 81 MG EC tablet Take 1 tablet (81 mg total) by mouth daily.  . budesonide-formoterol (SYMBICORT) 160-4.5 MCG/ACT inhaler Inhale 2 puffs into the lungs 2 (two) times daily.  . calcium-vitamin D (OSCAL WITH D) 250-125 MG-UNIT tablet Take 1 tablet by mouth daily.  . cephALEXin (KEFLEX) 500 MG capsule Take 1 capsule (500 mg total) by mouth 4 (four) times daily.  . Cholecalciferol (VITAMIN D) 2000 UNITS CAPS Take 1 capsule by mouth daily.  . cyproheptadine (PERIACTIN) 4 MG tablet Take 4 mg by mouth 2 (two) times daily.  . fluocinonide cream (LIDEX) 4.85 % Apply 1 application topically 2 (two) times daily.  . folic acid (FOLVITE) 1 MG tablet Take 1 mg by mouth daily.  . hydrOXYzine (ATARAX/VISTARIL) 10 MG tablet TAKE 2 TABLETS BY MOUTH AS NEEDED FOR ITCHING. DO NOT TAKE WITH ALPRAZOLAM  . ipratropium (ATROVENT) 0.02 %  nebulizer solution Take 2.5 mLs (0.5 mg total) by nebulization every 6 (six) hours as needed for wheezing or shortness of breath.  . Multiple Vitamin (MULTIVITAMIN) tablet Take 2 tablets by mouth every morning.   . triamcinolone ointment (KENALOG) 0.1 % APPLY TO AFFECTED AREA TWICE A DAY  . valACYclovir (VALTREX) 1000 MG tablet Take 1 tablet (1,000 mg total) by mouth 3 (three) times daily.  . methotrexate (RHEUMATREX) 2.5 MG tablet Take  2.5 mg by mouth.  . metoprolol tartrate (LOPRESSOR) 25 MG tablet Take 1 tablet (25 mg total) by mouth 2 (two) times daily.   No facility-administered encounter medications on file as of 05/16/2018.     Functional Status:   In your present state of health, do you have any difficulty performing the following activities: 05/03/2018 02/23/2018  Hearing? N N  Vision? N N  Difficulty concentrating or making decisions? N Y  Walking or climbing stairs? Y Y  Dressing or bathing? N Y  Doing errands, shopping? [name]  Preparing Food and eating ? N Y  Using the Toilet? N Y  In the past six months, have you accidently leaked urine? N Y  Do you have problems with loss of bowel control? N N  Managing your Medications? N Y  Managing your Finances? N Y  Housekeeping or managing your Housekeeping? Y Y  Some recent data might be hidden    Fall/Depression Screening:    Fall Risk  05/03/2018 02/23/2018 07/05/2017  Falls in the past year? Yes Yes Yes  Number falls in past yr: 2 or more 1 1  Injury with Fall? Yes Yes No  Risk Factor Category  High Fall Risk High Fall Risk -  Risk for fall due to : History of fall(s);Impaired balance/gait;Impaired mobility History of fall(s);Impaired balance/gait;Impaired mobility Impaired balance/gait  Follow up Education provided;Falls prevention discussed Education provided;Falls prevention discussed -   PHQ 2/9 Scores 05/03/2018 05/01/2018 02/23/2018 06/26/2017 09/22/2015 10/29/2014 08/16/2013  PHQ - 2 Score 2 2 3 1  0 0 0  PHQ- 9 Score 10 10 8  - - - -  Exception Documentation - - - - Patient refusal - -   BP 138/80 (BP Location: Left Arm, Patient Position: Sitting, Cuff Size: Normal)   Pulse 76   Resp 20   Ht 1.676 m (5\' 6" )   Wt 132 lb (59.9 kg)   SpO2 99%   BMI 21.31 kg/m  Assessment:  Case management related to History falls Skin issues related to dermatitis and shingles  Plan:  Will discussed several printed material for the following: Preventing Falls, Preventing  Falls in older adults and Standing Balance Exercises, Beginner. Will strongly encouraged pt to use assisted device such as a cane for balance if needed on those days when needed. Will assess home for safety measures and pose for pt to remove rugs through out the home. Will generate a plan of care with goals and interventions that were acceptable to pt.  Will provider supplies for a [facility] calendar and review accordingly for pt's knowledge. Will send provider update on pt's disposition with [facility] services and involvement with both the RN case manager and social work for [employer]. Will strongly encouraged pt to avoid irritation the site more and use the medicated prescriptions if at all possible. Strongly encouraged pt to use caution and clean her hands along with changing socks daily to avoid spreading the germs/bactria from her condition. Will offer to wrap her glasses with guaze and paper tape to her  comfort to avoid further irritation to her ear area.   THN CM Care Plan Problem One     Most Recent Value  Care Plan Problem One  Skin iritation related to dermatitis/shingles  (Pended)   Role Documenting the Problem One  Care Management Coordinator  (Pended)   Care Plan for Problem One  Active  (Pended)   THN CM Short Term Goal #1   Pt will avoid contact with her ongoing shingles/dermatistis to avoid the spread of her condition as much as possible over the next 30 days  (Pended)   THN CM Short Term Goal #1 Start Date  05/16/18  (Pended)   THN CM Short Term Goal #2   Pt will use the precribed medication for her skin irritation over the next 30 days  (Pended)   THN CM Short Term Goal #2 Start Date  05/16/18  (Pended)   Interventions for Short Term Goal #2  Discussed the use of her presribed medication to assist with the discomfort and reduce the ongoing iirritation.  (Pended)     THN CM Care Plan Problem Two     Most Recent Value  Care Plan Problem Two  Fall preventions  Role Documenting the  Problem Two  Care Management Coordinator  Care Plan for Problem Two  Active  [facility] CM Short Term Goal #1   Pt will not have any falls over the next 30 days.  THN CM Short Term Goal #1 Start Date  05/16/18  Interventions for Short Term Goal #2   Will discussed fall preventions measures and strongly encouraged pt to use any assisted device available to manage her ongoing safety measures.       Raina Mina, RN Care Management Coordinator [place] Office [phone]

## 2018-05-17 ENCOUNTER — Other Ambulatory Visit: Payer: Self-pay | Admitting: Adult Health

## 2018-05-17 ENCOUNTER — Other Ambulatory Visit: Payer: Self-pay | Admitting: *Deleted

## 2018-05-17 ENCOUNTER — Encounter: Payer: Self-pay | Admitting: *Deleted

## 2018-05-17 NOTE — Patient Outreach (Signed)
[facility]) Care Management  05/17/2018  Jenna Marquez Sep 28, 1945 [id number]  CSW was able to meet with patient today for the initial home visit.  Patient warned CSW that she was not feeling well today and that she may be a little ornery this morning because she only received a few hours of sleep last evening.  Patient reported that her great nephew, Jenna Marquez is currently living with her and providing care and supervision in the mornings and evenings.  Mr. Jenna Marquez assists patient with meal preparation, grocery shopping, light housekeeping duties, laundry, etc.  Patient's friend, as well as patient's son, Jenna Marquez assist patient with transportation to and from her physician appointments.    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  Patient reports taking Valacyclovir (Valtrex 1000 MG PO Daily), Cephalexin (Keflex 500 MG PO Daily), Hydroxyzine (Atarax/Vistaril 10 MG PO Daily) and Triamcinolone (Kenalog Ointment 0.1%).  Patient admits that she is not using the Fluocinonide (Lidex Cream 0.05%) because the ointment provides her with more relief.  Patient will have a follow-up appointment with her Dermatologist on Friday, June 15, 2018 at 10:30AM with Dr. Linus Marquez.  Patient reports that she no longer requires portable oxygen and appeared to be breathing easily during the home visit.  CSW noted that patient is no longer using her rolling walker to assist with ambulation.  Patient admits that she is a lot more steady on her feet and no longer has dizzy spells.  Patient reports she has lost about 6 pounds within the last month, but that she is finally back to her normal weight.  Patient indicated that she has hired an Engineer, production to assist her with bathing twice a week, charging her $20.00 per visit.  Patient continued to talk about various topics of concern, but did not wish to address her  symptoms of depression.  Patient reported, "Tell me something I don't already know".  Patient further reported that she is not at all interested in receiving counseling and supportive services, nor did patient want a referral for therapeutic services.  CSW politely explained to patient that this was the whole purpose of CSW's visit with patient today.    Patient reported, "If I was in that bad a shape, I would hire my own counselor and pay for it myself".  Patient further reported, "I said to myself when you called me, what exactly is she going to be able to do for me?"  CSW noted that patient was smoking her e-cigarette throughout the home visit and becoming increasingly agitated when CSW tried to offer social work services and resources.  CSW briefly spoke with patient about utilizing her deep breathing exercises and relaxation techniques when becoming anxious and/or depressed.  CSW also provided patient with EMMI information pertaining specifically to "Signs and Symptoms of Depression" for her review.  Patient was agreeable to CSW making a referral for her to Jenna Marquez, as well as Jenna Marquez, both through [employer] [facility] [place].  Patient understands that [employer] is currently on a waiting list, but that a representative with [employer] [facility] will be in contact with her when her name comes available.  Patient understands the process for arranging transportation services through [employer] and plans to utilize them for her upcoming appointment with her dermatologist on June 15, 2018.  CSW will perform a case closure on patient, as all goals of treatment have been  met from social work standpoint and no additional social work needs have been identified at this time.  CSW will notify patient's RNCM with [employer], Jenna Marquez of CSW's plans to close patient's case.  CSW will fax an update to patient's Primary Care Physician, Dr.  Janett Billow Marquez to ensure that they are aware of CSW's involvement with patient's plan of care.    THN CM Care Plan Problem One     Most Recent Value  Care Plan Problem One  Patient is experiencing symptoms of anixety and depression.  Role Documenting the Problem One  Clinical Social Worker  Care Plan for Problem One  Active  [facility] Long Term Goal   Patient will have a reduction in symptoms of anixety and depression, within the next 45 days, as a result of counseling and supportive services.  THN Long Term Goal Start Date  05/03/18  THN Long Term Goal Met Date  05/17/18  Interventions for Problem One Long Term Goal  Patient is refusing counseling and supportive services for symptoms of anxiety and depression.  THN CM Short Term Goal #1   Patient will meet with CSW for an initial home visit to discuss symptoms of anxiety and depression so that CSW can provide counseling and supportive services, within the next week.  THN CM Short Term Goal #1 Start Date  05/03/18  [facility] CM Short Term Goal #1 Met Date  05/17/18  Interventions for Short Term Goal #1  CSW was able to meet with patient today for the initial home visit.  THN CM Short Term Goal #2   Patient will review EMMI literature provided to her by CSW, within the next 30 days, pertaining specifically to anxiety and depression.  THN CM Short Term Goal #2 Start Date  05/03/18  [facility] CM Short Term Goal #2 Met Date  05/17/18  Interventions for Short Term Goal #2  Patient was given EMMI information and agreed to review independently.      Nat Christen, BSW, MSW, LCSW  Licensed Education [facility]  Mailing [place] [address] [place], [place] [address] Physical Address-[address], [place], [place] [address] Toll Free Main # [phone] Fax # [phone] Cell # [phone]  Office # [phone] [email or web address]

## 2018-05-22 ENCOUNTER — Other Ambulatory Visit: Payer: Self-pay | Admitting: Medical

## 2018-05-23 ENCOUNTER — Ambulatory Visit: Payer: Self-pay | Admitting: *Deleted

## 2018-05-23 ENCOUNTER — Other Ambulatory Visit: Payer: Self-pay | Admitting: Family Medicine

## 2018-05-31 ENCOUNTER — Other Ambulatory Visit: Payer: Self-pay | Admitting: Internal Medicine

## 2018-06-01 ENCOUNTER — Encounter: Payer: Self-pay | Admitting: Internal Medicine

## 2018-06-01 ENCOUNTER — Ambulatory Visit (INDEPENDENT_AMBULATORY_CARE_PROVIDER_SITE_OTHER): Payer: PPO | Admitting: Internal Medicine

## 2018-06-01 ENCOUNTER — Other Ambulatory Visit: Payer: Self-pay | Admitting: *Deleted

## 2018-06-01 ENCOUNTER — Ambulatory Visit: Payer: Self-pay

## 2018-06-01 VITALS — BP 118/60 | HR 125 | Temp 97.6°F | Ht 66.0 in | Wt 131.4 lb

## 2018-06-01 DIAGNOSIS — F419 Anxiety disorder, unspecified: Secondary | ICD-10-CM | POA: Diagnosis not present

## 2018-06-01 DIAGNOSIS — J449 Chronic obstructive pulmonary disease, unspecified: Secondary | ICD-10-CM

## 2018-06-01 DIAGNOSIS — B028 Zoster with other complications: Secondary | ICD-10-CM

## 2018-06-01 MED ORDER — ALBUTEROL SULFATE (2.5 MG/3ML) 0.083% IN NEBU
2.5000 mg | INHALATION_SOLUTION | Freq: Once | RESPIRATORY_TRACT | Status: AC
  Administered 2018-06-01: 2.5 mg via RESPIRATORY_TRACT

## 2018-06-01 MED ORDER — GABAPENTIN 100 MG PO CAPS: 100.0000 mg | ORAL_CAPSULE | Freq: Two times a day (BID) | ORAL | 1 refills | Status: DC

## 2018-06-01 NOTE — Progress Notes (Signed)
Pre visit review using our clinic review tool, if applicable. No additional management support is needed unless otherwise documented below in the visit note. 

## 2018-06-01 NOTE — Telephone Encounter (Signed)
Pt. called very upset and anxious.  Reported she didn't sleep all night, due to the severe itching of her shingles on scalp and behind the left ear.  Noted pt's. breathing was increased, as she was trying to report the symptoms and irritation of her shingles rash.  Pt. stated she gets short of breath when she gets excited; stated she usually takes a Nebulizer for this.  Advised her to take a Nebulizer tx. at this time; 9:17 AM.  After 2-3 minutes, pt. stopped the tx. and said, "Okay, I'm fine."  Reported she took all but one pill of her Valtrex on Wed., did not take any doses yesterday, and saved the last pill for this morning.  Reported the rash is mostly dry and scabbed, but there is an area behind the left ear that is sore, where her glasses rub the rash.  Stated her scalp itches severely.  Rated her itching at 10/10. Pt. tearful. Intermittently, cried out with the discomfort from itching.  Appt. Scheduled with PCP office @ 11:00 AM, to rule out infection. Care advice given per protocol; verb. Understanding.  Agreed with plan.            Reason for Disposition . [1] Looks infected (spreading redness, pus) AND [2] no fever . [1] MODERATE longstanding difficulty breathing (e.g., speaks in phrases, SOB even at rest, pulse 100-120) AND [2] SAME as normal  Answer Assessment - Initial Assessment Questions 1. APPEARANCE of RASH: "Describe the rash."      Dry and scabbed over 2. LOCATION: "Where is the rash located?"      Left side of head and behind left ear 3. ONSET: "When did the rash start?"     About 3 weeks 4. ITCHING: "Does the rash itch?" If so, ask: "How bad is the itch?"  (Scale 1-10; or mild, moderate, severe)     10/10; I was up all night long 5. PAIN: "Does the rash hurt?" If so, ask: "How bad is the pain?"  (Scale 1-10; or mild, moderate, severe)    C/o pain behind left ear, where glasses are rubbing  6. OTHER SYMPTOMS: "Do you have any other symptoms?" (e.g., fever)     Denied fever or  chills; denied any weeping / drainage from the shingles rash  Answer Assessment - Initial Assessment Questions 1. RESPIRATORY STATUS: "Describe your breathing?" (e.g., wheezing, shortness of breath, unable to speak, severe coughing)      Stated she got upset and felt short of breath  2. ONSET: "When did this breathing problem begin?"     "I don't know, it happens off and on"  3. PATTERN "Does the difficult breathing come and go, or has it been constant since it started?"     "It happens when I get upset" 4. SEVERITY: "How bad is your breathing?" (e.g., mild, moderate, severe)    - MILD: No SOB at rest, mild SOB with walking, speaks normally in sentences, can lay down, no retractions, pulse < 100.    - MODERATE: SOB at rest, SOB with minimal exertion and prefers to sit, cannot lie down flat, speaks in phrases, mild retractions, audible wheezing, pulse 100-120.    - SEVERE: Very SOB at rest, speaks in single words, struggling to breathe, sitting hunched forward, retractions, pulse > 120      Moderate; took Nebulizer tx. At 9:17 AM, during Triage call  5. RECURRENT SYMPTOM: "Have you had difficulty breathing before?" If so, ask: "When was the last time?" and "  What happened that time?"      Reported this happens when she gets excited.   6. CARDIAC HISTORY: "Do you have any history of heart disease?" (e.g., heart attack, angina, bypass surgery, angioplasty)      Did not ask this; pt. Very upset about her shingles during questioning about breathing; became increasingly upset with assess. questions.  7. LUNG HISTORY: "Do you have any history of lung disease?"  (e.g., pulmonary embolus, asthma, emphysema)     Bronchial asthma 8. CAUSE: "What do you think is causing the breathing problem?"      Stated she gets short of breath when becomes upset 9. OTHER SYMPTOMS: "Do you have any other symptoms? (e.g., dizziness, runny nose, cough, chest pain, fever)     Denied the above  Protocols used:  SHINGLES-A-AH, BREATHING DIFFICULTY-A-AH

## 2018-06-01 NOTE — Telephone Encounter (Signed)
Called her- she does not need to continue to use valtrex

## 2018-06-01 NOTE — Patient Instructions (Signed)
  GO TO THE FRONT DESK Schedule your next appointment for a checkup with your primary doctor on Monday  For itching and pain from shingles: Take Atarax as needed Use the cream LIDEX 2 times a day Start taking gabapentin, 1 tablet twice a day.  Watch for excessive drowsiness.  Continue taking your medications as usual.  Your pulse is a little high today, during the weekend if you have chest pain, more shortness of breath, palpitations, please go to the ER.

## 2018-06-01 NOTE — Telephone Encounter (Signed)
FYI

## 2018-06-01 NOTE — Patient Outreach (Signed)
[facility]) Care Management  06/01/2018  Jenna Marquez 1946-02-17 [id number]    RN received notice that pt called the 24 RN home line concerning itching form her ongoing Shingle and dermatsis.   Pt indicated she contact her provider and was seen today. Pt states she was prescribed medications and will follow up on Monday with Dr. Lorelei Marquez. Pt states it is "a lot better" but the medicine has not "kick in" entirely but better then before. Pt states she received aide services on yesterday for bathe services. States not eating much but her son is coming to fix her some soup. Pt reports her weight at 131. 6 lbs but will try to tolerate meals.   RN review the plan of care and scheduled the next home visit for November. RN informed pt to contact this RN if additional request or inquires are needed for interventions. Pt very appreciative prior to the end of this call. No addition needs at this time.  THN CM Care Plan Problem One     Most Recent Value  Care Plan Problem One  Skin iritation related to dermatitis/shingles  Role Documenting the Problem One  Care Management Coordinator  Care Plan for Problem One  Active  THN CM Short Term Goal #1   Pt will avoid contact with her ongoing shingles/dermatistis to avoid the spread of her condition as much as possible over the next 30 days  THN CM Short Term Goal #1 Start Date  05/16/18  Interventions for Short Term Goal #1  Will strongly enocuraged pt to avoid scratching or irritating the sites further and use the prescribed medications for ongoign itching. Will extend to allow adherence with use of providers recommendations.  THN CM Short Term Goal #2   Pt will use the precribed medication for her skin irritation over the next 30 days  THN CM Short Term Goal #2 Start Date  05/16/18  Interventions for Short Term Goal #2  Will verify new medicatoins and pt's understanding of the medication priscribed today and usage for her ongoing  itching/Shingles. WIll extend to allow adherence with the providers recommendations.    THN CM Care Plan Problem Two     Most Recent Value  Care Plan Problem Two  Fall preventions  Role Documenting the Problem Two  Care Management Coordinator  Care Plan for Problem Two  Active  THN CM Short Term Goal #1   Pt will not have any falls over the next 30 days.  THN CM Short Term Goal #1 Start Date  05/16/18  Interventions for Short Term Goal #2   Will verifiy no falls and extend to allow ongoing adherence with her safety measures discussed in the home on the initial home visit.      Jenna Mina, RN Care Management Coordinator [place] Office [phone]

## 2018-06-01 NOTE — Progress Notes (Signed)
Subjective:    Patient ID: Guy Sandifer, female    DOB: 30-Jul-1946, 72 y.o.   MRN: [id number]  DOS:  06/01/2018 Type of visit - description : Acute Interval history: She is here because the area where he had shingles is itching severely and is also sore. She was noted to be extremely anxious when she arrived to the office, states that she takes 4 nebs a day, she missed her neb this morning and requests one   Review of Systems Denies fevers, chills?. She has chronic cough, clears.,  Symptoms are baseline. Denies CP  Past Medical History:  Diagnosis Date  . Allergic rhinitis   . Anxiety    "occasionally"  . Asthma   . COPD (chronic obstructive pulmonary disease) ([place])   . GERD (gastroesophageal reflux disease)   . Mobitz type 2 second degree AV block 01/06/2015    Past Surgical History:  Procedure Laterality Date  . [place]  . EP IMPLANTABLE DEVICE N/A 01/07/2015   Procedure: Pacemaker Implant;  Surgeon: Evans Lance, MD;  Location: [facility];  Service: Cardiovascular;  Laterality: N/A;  . PACEMAKER INSERTION    . TONSILLECTOMY AND ADENOIDECTOMY  1954    Social History   Socioeconomic History  . Marital status: Widowed    Spouse name: Not on file  . Number of children: 1  . Years of education: Not on file  . Highest education level: Not on file  Occupational History  . Occupation: Retired    Fish farm manager: [employer]    Comment: Production manager  Social Needs  . Financial resource strain: Not on file  . Food insecurity:    Worry: Not on file    Inability: Not on file  . Transportation needs:    Medical: Not on file    Non-medical: Not on file  Tobacco Use  . Smoking status: Former Smoker    Packs/day: 1.00    Years: 35.00    Pack years: 35.00    Types: Cigarettes    Last attempt to quit: 08/10/2012    Years since quitting: 5.8  . Smokeless tobacco: Never Used  . Tobacco comment: Started at age 82  Substance and Sexual  Activity  . Alcohol use: Yes    Alcohol/week: 28.0 standard drinks    Types: 28 Shots of liquor per week    Comment: 01/06/2015 "2 shot mixed drinks, 2/day"  . Drug use: No  . Sexual activity: Not Currently    Birth control/protection: Post-menopausal  Lifestyle  . Physical activity:    Days per week: Not on file    Minutes per session: Not on file  . Stress: Not on file  Relationships  . Social connections:    Talks on phone: Not on file    Gets together: Not on file    Attends religious service: Not on file    Active member of club or organization: Not on file    Attends meetings of clubs or organizations: Not on file    Relationship status: Not on file  . Intimate partner violence:    Fear of current or ex partner: Not on file    Emotionally abused: Not on file    Physically abused: Not on file    Forced sexual activity: Not on file  Other Topics Concern  . Not on file  Social History Narrative   Lost husband in 12-2011, she lost her sister in 2012, brother in July 2013. She  lives by herself, God son stayed with her. She retired as Radio producer.      Allergies as of 06/01/2018      Reactions   Avelox [moxifloxacin Hcl In Nacl] Itching, Rash      Medication List        Accurate as of 06/01/18 11:59 PM. Always use your most recent med list.          acyclovir ointment 5 % Commonly known as:  ZOVIRAX Apply 1 application topically 3 (three) times daily.   albuterol 108 (90 Base) MCG/ACT inhaler Commonly known as:  PROVENTIL HFA;VENTOLIN HFA Inhale 1-2 puffs into the lungs every 6 (six) hours as needed for wheezing or shortness of breath.   albuterol (2.5 MG/3ML) 0.083% nebulizer solution Commonly known as:  PROVENTIL Take 3 mLs (2.5 mg total) by nebulization every 6 (six) hours as needed for wheezing or shortness of breath.   ALPRAZolam 0.5 MG tablet Commonly known as:  XANAX Take 1 tablet (0.5 mg total) by mouth every 8 (eight) hours as needed for anxiety  (**DO NOT TAKE AT NIGHT WITH HYDROXYZINE**). Do not take with alcohol   aspirin 81 MG EC tablet Take 1 tablet (81 mg total) by mouth daily.   budesonide-formoterol 160-4.5 MCG/ACT inhaler Commonly known as:  SYMBICORT Inhale 2 puffs into the lungs 2 (two) times daily.   calcium-vitamin D 250-125 MG-UNIT tablet Commonly known as:  OSCAL WITH D Take 1 tablet by mouth daily.   cyproheptadine 4 MG tablet Commonly known as:  PERIACTIN Take 4 mg by mouth 2 (two) times daily.   fluocinonide cream 0.05 % Commonly known as:  LIDEX Apply 1 application topically 2 (two) times daily.   folic acid 1 MG tablet Commonly known as:  FOLVITE Take 1 mg by mouth daily.   gabapentin 100 MG capsule Commonly known as:  NEURONTIN Take 1 capsule (100 mg total) by mouth 2 (two) times daily.   hydrOXYzine 10 MG tablet Commonly known as:  ATARAX/VISTARIL TAKE 2 TABLETS BY MOUTH AS NEEDED FOR ITCHING. DO NOT TAKE WITH ALPRAZOLAM   ibuprofen 800 MG tablet Commonly known as:  ADVIL,MOTRIN Take 1 tablet (800 mg total) by mouth 3 (three) times daily. Use only on occasion for pain   ipratropium 0.02 % nebulizer solution Commonly known as:  ATROVENT TAKE 2.5 MLS BY NEBULIZATION EVERY 6 HOURS AS NEEDED FOR WHEEZING OR SHORTNESS OF BREATH.   methotrexate 2.5 MG tablet Commonly known as:  RHEUMATREX Take 2.5 mg by mouth.   metoprolol tartrate 25 MG tablet Commonly known as:  LOPRESSOR Take 1 tablet (25 mg total) by mouth 2 (two) times daily.   multivitamin tablet Take 2 tablets by mouth every morning.   triamcinolone ointment 0.1 % Commonly known as:  KENALOG APPLY TO AFFECTED AREA TWICE A DAY   Vitamin D 2000 units Caps Take 1 capsule by mouth daily.          Objective:   Physical Exam BP 118/60   Pulse (!) 125   Temp 97.6 F (36.4 C) (Oral)   Ht 5\' 6"  (1.676 m)   Wt 131 lb 6 oz (59.6 kg)   SpO2 93%   BMI 21.20 kg/m  General:   Well developed, NAD, see BMI.  HEENT:    Normocephalic . Face symmetric, atraumatic Lungs:  Few rhonchi, + wheezes bilaterally.  No respiratory distress. Normal respiratory effort, no intercostal retractions, no accessory muscle use. Heart: tachycardic,  no murmur.  No pretibial edema  bilaterally  Skin: Area where shingles was located is now showing dried scabs, see picture.  I do not see active blisters, redness, swelling or discharge. Neurologic:  alert & oriented X3.  Speech normal, gait appropriate for age and unassisted Psych--  Cognition and judgment appear intact.  Cooperative with normal attention span and concentration.  Behavior appropriate. She was extremely anxious at the beginning of the visit, after we talk about her problems she seemed more relaxed..        Assessment & Plan:    72 year old female, PMH includes COPD asthma, GERD, anxiety, Mobitz type II, eczema on MTX every Friday, recently dx w/ shingles  presents with:   Shingles: With persistent  severe itching and some pain @ side of rash. Recommend Atarax, she has lidex, ok to use .  Will start a low-dose of gabapentin for pain. COPD: Upon arrival, her chest was congested, see physical exam, she got 1 nebulization, then her lungs sound better after the neb  with only few residual rhonchi. Tachycardia: Pulse elevated in the 118, seems regular, the patient's daughter reports that she gets  tachycardic on and off.  She denies chest pain, palpitations.  After the nebulization she felt better but her heart rate continue  to be elevated thus  I ask her to come back in 3 days (Monday) to see PCP and be rechecked.  ER if she has symptoms.  See AVS. Anxiety: rec to d/w PCP she was very anxious at the beginning of the visit. RTC 3 days to see PCP

## 2018-06-02 NOTE — Progress Notes (Addendum)
[place] [facility] [address] [place], [place] [address] [phone] [id number]  Date:  06/04/2018   Name:  Jenna Marquez   DOB:  09-02-1945   MRN:  [id number]  PCP:  Jenna Mclean, MD    Chief Complaint: Herpes Zoster (follow up dr. Larose Kells, 06/01/18)   History of Present Illness:  Jenna Marquez is a 72 y.o. very pleasant female patient who presents with the following:  Seen by Dr. Larose Kells twice recently with possible shingles on the back of her head 9/27: Shingles : Despite the lack of pain, given that she has blisters in a dermatomal location (C2-3) my diagnosis is shingles. The patient was very disturbed by the diagnosis, states she had a vaccination.  I explained her to the best of my ability that the vaccination decrease the chances of having the illness but does not eliminate the chance.  The fact that she is not hurting, a major problem with shingles, may have to do with the fact that she got vaccinated. Recommend Valtrex for 1 week, local care with acyclovir cream. I wonder about a staph infection because she has some golden crusting.  Rx Keflex. Call if no better. Call if she has anything near her eyes. Hold MTX until better  Elevated BP: BP today is elevated, she is also very stressed about it, counseling, recommend to check at home. 10/18: Shingles: With persistent according to the patient severe itching and some pain. Recommend Atarax, she has lidex, ok to use .  We will start a low-dose of gabapentin for pain. COPD: Upon arrival, her chest was congested, see physical exam, she got 1 nebulization, then her lungs sound better, with only few rhonchi. Tachycardia: Pulse elevated in the 118, seems regular, the patient's daughter reports that she gets  tachycardic on and off.  She denies chest pain, palpitations.  After the nebulization she felt better but her heart rate continued to be elevated 10 minutes.  I ask her to come  back in 3 days (Monday) to see PCP and be rechecked.  ER if she has symptoms.  See AVS. Anxiety: rec to d/w PCP she was very anxious at the beginning of the visit. RTC 3 days to see PCP   Today Denese is doing much better physically and emotionally Her shingles are doing much better.  She has already scabbed and then the scabs have been coming off. The area is itchy but not painful any longer Pulse is normal!   COPD sx are baseline at this time She did a neb this am in the car She is continuing to use her symbicort No fever or chills  She reports that she will be seeing derm ?this week for her foot condition. She was asked to get BW for med monitoirng by them- we assume this is for her methotrexate. She should need a CBC and CMP which I can order for her today   Albuterol prn Asa 81 symbicort neurontin Atarax Lopressor  BP Readings from Last 3 Encounters:  06/04/18 136/68  06/01/18 118/60  05/16/18 138/80       Patient Active Problem List   Diagnosis Date Noted  . Chronic respiratory failure with hypoxia ([place]) 04/24/2018  . MDD (major depressive disorder), single episode, severe , no psychosis ([place])   . Hemoptysis 02/19/2018  . Community acquired pneumonia 02/19/2018  . Vagina bleeding 02/19/2018  . Alcohol abuse 02/19/2018  . Left knee pain  07/11/2017  . PVD (peripheral vascular disease) ([place]) 04/04/2017  . HTN (hypertension) 11/24/2016  . Paroxysmal atrial fibrillation ([place]) 09/30/2016  . Mobitz type 2 second degree AV block 09/30/2016  . Swelling of arm 05/22/2015  . Left shoulder pain 05/22/2015  . CKD (chronic kidney disease) stage 3, GFR 30-59 ml/min (HCC) 05/11/2015  . Pacemaker 04/22/2015  . Generalized anxiety disorder 01/11/2012  . 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   . Tobacco abuse     Past Medical History:  Diagnosis Date  . Allergic rhinitis   . Anxiety    "occasionally"  . Asthma   . COPD (chronic obstructive  pulmonary disease) ([place])   . GERD (gastroesophageal reflux disease)   . Mobitz type 2 second degree AV block 01/06/2015    Past Surgical History:  Procedure Laterality Date  . [place]  . EP IMPLANTABLE DEVICE N/A 01/07/2015   Procedure: Pacemaker Implant;  Surgeon: Evans Lance, MD;  Location: [facility];  Service: Cardiovascular;  Laterality: N/A;  . PACEMAKER INSERTION    . TONSILLECTOMY AND ADENOIDECTOMY  1954    Social History   Tobacco Use  . Smoking status: Former Smoker    Packs/day: 1.00    Years: 35.00    Pack years: 35.00    Types: Cigarettes    Last attempt to quit: 08/10/2012    Years since quitting: 5.8  . Smokeless tobacco: Never Used  . Tobacco comment: Started at age 53  Substance Use Topics  . Alcohol use: Yes    Alcohol/week: 28.0 standard drinks    Types: 28 Shots of liquor per week    Comment: 01/06/2015 "2 shot mixed drinks, 2/day"  . Drug use: No    Family History  Problem Relation Age of Onset  . Coronary artery disease Mother   . Heart disease Mother        CHF  . Diabetes Neg Hx   . Cancer Neg Hx   . Stroke Neg Hx   . Colon cancer Neg Hx     Allergies  Allergen Reactions  . Avelox [Moxifloxacin Hcl In Nacl] Itching and Rash    Medication list has been reviewed and updated.  Current Outpatient Medications on File Prior to Visit  Medication Sig Dispense Refill  . acyclovir ointment (ZOVIRAX) 5 % Apply 1 application topically 3 (three) times daily. 30 g 0  . albuterol (PROAIR HFA) 108 (90 Base) MCG/ACT inhaler Inhale 1-2 puffs into the lungs every 6 (six) hours as needed for wheezing or shortness of breath. 1 Inhaler 3  . albuterol (PROVENTIL) (2.5 MG/3ML) 0.083% nebulizer solution Take 3 mLs (2.5 mg total) by nebulization every 6 (six) hours as needed for wheezing or shortness of breath. 75 mL 12  . ALPRAZolam (XANAX) 0.5 MG tablet Take 1 tablet (0.5 mg total) by mouth every 8 (eight) hours as needed for  anxiety (**DO NOT TAKE AT NIGHT WITH HYDROXYZINE**). Do not take with alcohol 15 tablet 0  . aspirin EC 81 MG EC tablet Take 1 tablet (81 mg total) by mouth daily. 30 tablet 0  . budesonide-formoterol (SYMBICORT) 160-4.5 MCG/ACT inhaler Inhale 2 puffs into the lungs 2 (two) times daily. 3 Inhaler 1  . calcium-vitamin D (OSCAL WITH D) 250-125 MG-UNIT tablet Take 1 tablet by mouth daily.    . Cholecalciferol (VITAMIN D) 2000 UNITS CAPS Take 1 capsule by mouth daily.    . cyproheptadine (PERIACTIN) 4 MG tablet Take 4  mg by mouth 2 (two) times daily.    . fluocinonide cream (LIDEX) 4.82 % Apply 1 application topically 2 (two) times daily.    . folic acid (FOLVITE) 1 MG tablet Take 1 mg by mouth daily.  11  . gabapentin (NEURONTIN) 100 MG capsule Take 1 capsule (100 mg total) by mouth 2 (two) times daily. 60 capsule 1  . hydrOXYzine (ATARAX/VISTARIL) 10 MG tablet TAKE 2 TABLETS BY MOUTH AS NEEDED FOR ITCHING. DO NOT TAKE WITH ALPRAZOLAM 60 tablet 5  . ibuprofen (ADVIL,MOTRIN) 800 MG tablet Take 1 tablet (800 mg total) by mouth 3 (three) times daily. Use only on occasion for pain 30 tablet 0  . ipratropium (ATROVENT) 0.02 % nebulizer solution TAKE 2.5 MLS BY NEBULIZATION EVERY 6 HOURS AS NEEDED FOR WHEEZING OR SHORTNESS OF BREATH. 62.5 mL 5  . methotrexate (RHEUMATREX) 2.5 MG tablet Take 2.5 mg by mouth.  0  . Multiple Vitamin (MULTIVITAMIN) tablet Take 2 tablets by mouth every morning.     . triamcinolone ointment (KENALOG) 0.1 % APPLY TO AFFECTED AREA TWICE A DAY  0  . metoprolol tartrate (LOPRESSOR) 25 MG tablet Take 1 tablet (25 mg total) by mouth 2 (two) times daily. 180 tablet 2   No current facility-administered medications on file prior to visit.     Review of Systems:  As per HPI- otherwise negative. No fever or chills Continues to have itching in the shingles distribution She has thick scale on her feet which she has tried to remove at home with some success  Physical  Examination: Vitals:   06/04/18 1103  BP: 136/68  Pulse: 83  Resp: 16  Temp: 98.2 F (36.8 C)  SpO2: 100%   Vitals:   06/04/18 1103  Weight: 134 lb (60.8 kg)  Height: 5\' 6"  (1.676 m)   Body mass index is 21.63 kg/m. Ideal Body Weight: Weight in (lb) to have BMI = 25: 154.6  GEN: WDWN, NAD, Non-toxic, A & O x 3, normal weight, elderly woman who looks well/ normal state of health. She is generally somewhat frail for her age 64: Atraumatic, Normocephalic. Neck supple. No masses, No LAD. Ears and Nose: No external deformity. CV: RRR, No M/G/R. No JVD. No thrill. No extra heart sounds. PULM: CTA B, no wheezes, crackles, rhonchi. No retractions. No resp. distress. No accessory muscle use. Lungs sound good today EXTR: No c/c/e NEURO Normal gait.  PSYCH: Normally interactive. Conversant. Not depressed or anxious appearing.  Calm demeanor.  Nearly healed zoster rash on the left posterior neck She has thick, oily scale on her feet which is being addressed by derm per her report   Assessment and Plan: Medication monitoring encounter - Plan: CBC, Comprehensive metabolic panel  Herpes zoster without complication  Chronic obstructive pulmonary disease, unspecified COPD type ([place])  Tinea pedis of both feet  Shingles is healing, doing fine Discussed her getting shingrix at her convenience Continue her inhalers for COPD Obtain labs for her derm as above    Signed Lamar Blinks, MD  Received her labs- letter to pt   MCV and LFTs getting better Renal function off of baseline- return for BMP in 2-3 weeks lab only OW recheck in January as planned   Results for orders placed or performed in visit on 06/04/18  CBC  Result Value Ref Range   WBC 4.5 4.0 - 10.5 K/uL   RBC 3.84 (L) 3.87 - 5.11 Mil/uL   Platelets 186.0 150.0 - 400.0 K/uL   Hemoglobin  13.8 12.0 - 15.0 g/dL   HCT 41.4 36.0 - 46.0 %   MCV 107.7 (H) 78.0 - 100.0 fl   MCHC 33.4 30.0 - 36.0 g/dL   RDW 16.5 (H)  11.5 - 15.5 %  Comprehensive metabolic panel  Result Value Ref Range   Sodium 138 135 - 145 mEq/L   Potassium 4.4 3.5 - 5.1 mEq/L   Chloride 102 96 - 112 mEq/L   CO2 28 19 - 32 mEq/L   Glucose, Bld 102 (H) 70 - 99 mg/dL   BUN 24 (H) 6 - 23 mg/dL   Creatinine, Ser 1.33 (H) 0.40 - 1.20 mg/dL   Total Bilirubin 0.8 0.2 - 1.2 mg/dL   Alkaline Phosphatase 93 39 - 117 U/L   AST 81 (H) 0 - 37 U/L   ALT 27 0 - 35 U/L   Total Protein 6.3 6.0 - 8.3 g/dL   Albumin 3.3 (L) 3.5 - 5.2 g/dL   Calcium 9.2 8.4 - 10.5 mg/dL   GFR 50.07 (L) >60.00 mL/min

## 2018-06-04 ENCOUNTER — Ambulatory Visit (INDEPENDENT_AMBULATORY_CARE_PROVIDER_SITE_OTHER): Payer: PPO | Admitting: Family Medicine

## 2018-06-04 ENCOUNTER — Encounter: Payer: Self-pay | Admitting: Family Medicine

## 2018-06-04 ENCOUNTER — Encounter: Payer: Self-pay | Admitting: Internal Medicine

## 2018-06-04 VITALS — BP 136/68 | HR 83 | Temp 98.2°F | Resp 16 | Ht 66.0 in | Wt 134.0 lb

## 2018-06-04 DIAGNOSIS — Z5181 Encounter for therapeutic drug level monitoring: Secondary | ICD-10-CM | POA: Diagnosis not present

## 2018-06-04 DIAGNOSIS — J449 Chronic obstructive pulmonary disease, unspecified: Secondary | ICD-10-CM

## 2018-06-04 DIAGNOSIS — B353 Tinea pedis: Secondary | ICD-10-CM | POA: Diagnosis not present

## 2018-06-04 DIAGNOSIS — B029 Zoster without complications: Secondary | ICD-10-CM

## 2018-06-04 DIAGNOSIS — R7989 Other specified abnormal findings of blood chemistry: Secondary | ICD-10-CM

## 2018-06-04 LAB — CBC
HCT: 41.4 % (ref 36.0–46.0)
Hemoglobin: 13.8 g/dL (ref 12.0–15.0)
MCHC: 33.4 g/dL (ref 30.0–36.0)
MCV: 107.7 fl — AB (ref 78.0–100.0)
PLATELETS: 186 10*3/uL (ref 150.0–400.0)
RBC: 3.84 Mil/uL — ABNORMAL LOW (ref 3.87–5.11)
RDW: 16.5 % — ABNORMAL HIGH (ref 11.5–15.5)
WBC: 4.5 10*3/uL (ref 4.0–10.5)

## 2018-06-04 LAB — COMPREHENSIVE METABOLIC PANEL
ALT: 27 U/L (ref 0–35)
AST: 81 U/L — AB (ref 0–37)
Albumin: 3.3 g/dL — ABNORMAL LOW (ref 3.5–5.2)
Alkaline Phosphatase: 93 U/L (ref 39–117)
BUN: 24 mg/dL — ABNORMAL HIGH (ref 6–23)
CALCIUM: 9.2 mg/dL (ref 8.4–10.5)
CO2: 28 meq/L (ref 19–32)
CREATININE: 1.33 mg/dL — AB (ref 0.40–1.20)
Chloride: 102 mEq/L (ref 96–112)
GFR: 50.07 mL/min — ABNORMAL LOW (ref >60.00)
Glucose, Bld: 102 mg/dL — ABNORMAL HIGH (ref 70–99)
Potassium: 4.4 mEq/L (ref 3.5–5.1)
SODIUM: 138 meq/L (ref 135–145)
Total Bilirubin: 0.8 mg/dL (ref 0.2–1.2)
Total Protein: 6.3 g/dL (ref 6.0–8.3)

## 2018-06-04 NOTE — Addendum Note (Signed)
Addended by: Lamar Blinks C on: 06/04/2018 06:57 PM   Modules accepted: Orders

## 2018-06-04 NOTE — Patient Instructions (Signed)
It looks like you are doing much better!  Shingles is nearly healed. Should continue to do fine I will get a blood count and metabolic profile for you today-I think this is what your derm needs.  You can let them know that these labs were drawn  There is a new shingles vaccine that you can have done at your drug store at your convenience in a few months- perhaps in the spring.  Called Shingrix

## 2018-06-05 ENCOUNTER — Encounter: Payer: Self-pay | Admitting: Cardiology

## 2018-06-15 ENCOUNTER — Telehealth: Payer: Self-pay

## 2018-06-15 DIAGNOSIS — B029 Zoster without complications: Secondary | ICD-10-CM | POA: Diagnosis not present

## 2018-06-15 DIAGNOSIS — L2089 Other atopic dermatitis: Secondary | ICD-10-CM | POA: Diagnosis not present

## 2018-06-15 DIAGNOSIS — Z79899 Other long term (current) drug therapy: Secondary | ICD-10-CM | POA: Diagnosis not present

## 2018-06-15 NOTE — Telephone Encounter (Signed)
Last OV notes and lab results have been faxed to Dr. Susie Cassette.

## 2018-06-15 NOTE — Telephone Encounter (Signed)
Copied from [place]. Topic: Referral - Medical Records >> Jun 13, 2018  3:10 PM Lennox Solders wrote: Reason for CRM: pt has an appt with dr Alden Benjamin dermatologist that we referred her to on 06-15-18. Please fax last blood work results to [phone] attn brittany >> Jun 13, 2018  3:24 PM Lennox Solders wrote: Dr Susie Cassette phone number [phone]

## 2018-06-17 ENCOUNTER — Other Ambulatory Visit: Payer: Self-pay | Admitting: Family Medicine

## 2018-06-19 ENCOUNTER — Other Ambulatory Visit: Payer: Self-pay | Admitting: Adult Health

## 2018-06-20 ENCOUNTER — Telehealth: Payer: Self-pay | Admitting: Family Medicine

## 2018-06-20 ENCOUNTER — Telehealth: Payer: Self-pay

## 2018-06-20 NOTE — Telephone Encounter (Signed)
Called patient to inform [facility] to [facility] these bandages.

## 2018-06-20 NOTE — Telephone Encounter (Signed)
Spoke with Mrs. Guard regarding AWV. Patient declined to schedule at this time. Patient asked if there are any resources for a home aide. Informed Mrs. Vernier that office will see what resources are available and will contact her with the information. SF

## 2018-06-20 NOTE — Telephone Encounter (Signed)
Copied from [place] [id number]. Topic: Quick Communication - See Telephone Encounter >> Jun 20, 2018 12:15 PM Berneta Levins wrote: CRM for notification. See Telephone encounter for: 06/20/18.  Pt states that she needs butterfly bandages (Allevyn Life) 6 1/16 inch x 6 1/16 inch.  States that she received a quantity of these after coming out of the hospital to care for her wound and that she still needs them but the only place she can find them is [facility] supply and they are quite expensive.  Pt wants to know if the office has any of these she can have or can point her in a direction of where to get them. Pt can be reached at [phone]

## 2018-06-21 ENCOUNTER — Telehealth: Payer: Self-pay | Admitting: *Deleted

## 2018-06-21 NOTE — Telephone Encounter (Signed)
Received Physician Orders from [facility]; forwarded to provider/SLS 11/07

## 2018-06-23 ENCOUNTER — Other Ambulatory Visit: Payer: Self-pay | Admitting: Internal Medicine

## 2018-06-28 ENCOUNTER — Ambulatory Visit: Payer: Self-pay | Admitting: *Deleted

## 2018-07-04 ENCOUNTER — Other Ambulatory Visit: Payer: Self-pay | Admitting: *Deleted

## 2018-07-04 NOTE — Patient Outreach (Addendum)
[name]) Care Management   07/04/2018  Jenna Marquez 01-Jun-1946 [id number]  Jenna Marquez is an 72 y.o. female  Subjective:  FALLS: Pt reports no falls and she continues to ambulate throughout the house with no injuries or falls. States she does not use the DME only needed.  SKIN: Pt states her shingles much improved with no additional scabbing but still some itching on occassions. Pt reports no open wounds however some ongoing itching.  MEDICATION: Pt continue to use the prescribed topical medications for her ongoing skin issues. Pt states she continue to use the topical medication to assist with her ongoing dermatisis.   Objective:   Review of Systems  Skin: Positive for itching.       History shingles and dermatitis.  All other systems reviewed and are negative.   Physical Exam  Constitutional: She is oriented to person, place, and time. She appears well-developed and well-nourished.  HENT:  Right Ear: External ear normal.  Left Ear: External ear normal.  Eyes: EOM are normal.  Neck: Normal range of motion.  Cardiovascular: Normal heart sounds.  Respiratory: Effort normal and breath sounds normal.  GI: Soft. Bowel sounds are normal.  Neurological: She is alert and oriented to person, place, and time.  Skin: Skin is warm and dry.  Psychiatric: She has a normal mood and affect. Her behavior is normal. Judgment and thought content normal.    Encounter Medications:   Outpatient Encounter Medications as of 07/04/2018  Medication Sig  . acyclovir ointment (ZOVIRAX) 5 % Apply 1 application topically 3 (three) times daily.  Marland Kitchen albuterol (PROAIR HFA) 108 (90 Base) MCG/ACT inhaler Inhale 1-2 puffs into the lungs every 6 (six) hours as needed for wheezing or shortness of breath.  Marland Kitchen albuterol (PROVENTIL) (2.5 MG/3ML) 0.083% nebulizer solution Take 3 mLs (2.5 mg total) by nebulization every 6 (six) hours as needed for wheezing or shortness of breath.  Marland Kitchen  albuterol (PROVENTIL) (2.5 MG/3ML) 0.083% nebulizer solution INHALE CONTENTS OF 1 VIAL VIA NEBULIZER 4 TIMES DAILY  . ALPRAZolam (XANAX) 0.5 MG tablet Take 1 tablet (0.5 mg total) by mouth every 8 (eight) hours as needed for anxiety (**DO NOT TAKE AT NIGHT WITH HYDROXYZINE**). Do not take with alcohol  . aspirin EC 81 MG EC tablet Take 1 tablet (81 mg total) by mouth daily.  . budesonide-formoterol (SYMBICORT) 160-4.5 MCG/ACT inhaler Inhale 2 puffs into the lungs 2 (two) times daily.  . calcium-vitamin D (OSCAL WITH D) 250-125 MG-UNIT tablet Take 1 tablet by mouth daily.  . Cholecalciferol (VITAMIN D) 2000 UNITS CAPS Take 1 capsule by mouth daily.  . cyproheptadine (PERIACTIN) 4 MG tablet Take 4 mg by mouth 2 (two) times daily.  . fluocinonide cream (LIDEX) 6.38 % Apply 1 application topically 2 (two) times daily.  . folic acid (FOLVITE) 1 MG tablet Take 1 mg by mouth daily.  Marland Kitchen gabapentin (NEURONTIN) 100 MG capsule TAKE 1 CAPSULE BY MOUTH TWICE A DAY  . hydrOXYzine (ATARAX/VISTARIL) 10 MG tablet TAKE 2 TABLETS BY MOUTH AS NEEDED FOR ITCHING. DO NOT TAKE WITH ALPRAZOLAM  . ibuprofen (ADVIL,MOTRIN) 800 MG tablet Take 1 tablet (800 mg total) by mouth 3 (three) times daily. Use only on occasion for pain  . ipratropium (ATROVENT) 0.02 % nebulizer solution TAKE 2.5 MLS BY NEBULIZATION EVERY 6 HOURS AS NEEDED FOR WHEEZING OR SHORTNESS OF BREATH.  . methotrexate (RHEUMATREX) 2.5 MG tablet Take 2.5 mg by mouth.  . metoprolol tartrate (LOPRESSOR) 25 MG tablet  Take 1 tablet (25 mg total) by mouth 2 (two) times daily.  . Multiple Vitamin (MULTIVITAMIN) tablet Take 2 tablets by mouth every morning.   . triamcinolone ointment (KENALOG) 0.1 % APPLY TO AFFECTED AREA TWICE A DAY   No facility-administered encounter medications on file as of 07/04/2018.     Functional Status:   In your present state of health, do you have any difficulty performing the following activities: 05/03/2018 02/23/2018  Hearing? N N   Vision? N N  Difficulty concentrating or making decisions? N Y  Walking or climbing stairs? Y Y  Dressing or bathing? N Y  Doing errands, shopping? Jenna Marquez  Preparing Food and eating ? N Y  Using the Toilet? N Y  In the past six months, have you accidently leaked urine? N Y  Do you have problems with loss of bowel control? N N  Managing your Medications? N Y  Managing your Finances? N Y  Housekeeping or managing your Housekeeping? Y Y  Some recent data might be hidden    Fall/Depression Screening:    Fall Risk  05/03/2018 02/23/2018 07/05/2017  Falls in the past year? Yes Yes Yes  Number falls in past yr: 2 or more 1 1  Injury with Fall? Yes Yes No  Risk Factor Category  High Fall Risk High Fall Risk -  Risk for fall due to : History of fall(s);Impaired balance/gait;Impaired mobility History of fall(s);Impaired balance/gait;Impaired mobility Impaired balance/gait  Follow up Education provided;Falls prevention discussed Education provided;Falls prevention discussed -   PHQ 2/9 Scores 05/03/2018 05/01/2018 02/23/2018 06/26/2017 09/22/2015 10/29/2014 08/16/2013  PHQ - 2 Score '2 2 3 1 ' 0 0 0  PHQ- 9 Score '10 10 8 ' - - - -  Exception Documentation - - - - Patient refusal - -   BP 120/70 (BP Location: Left Arm, Patient Position: Sitting, Cuff Size: Normal)   Pulse 60 Comment: pacemaker  Resp 20   Ht 1.676 m ('5\' 6"' )   Wt 128 lb (58.1 kg)   SpO2 96%   BMI 20.66 kg/m    Assessment:   Ongoing follow up on falls Ongoing follow up on skin issues related to shingles/dermatis Follow up on medication adherence  Plan:  Will verify no falls and continue to encourage pt to use her assisted devices to prevent falls and possible injuries. Will verify ongoing safety in the home and stress the importance of using her DME. Will verify pt's shingles/dermatis continues to improve with no additional issues.  Will stress the importance for pt to continue using her topical medications to assist with her  shingles/dermatis.  Plan of care review and discussed with interventions adjusted based upon pt's progress. WIll follow up next month with a telephone call of inquired and update on his plan of care.  THN CM Care Plan Problem One     Most Recent Value  Care Plan Problem One  Skin iritation related to dermatitis/shingles  Role Documenting the Problem One  Care Management Coordinator  Care Plan for Problem One  Active  THN CM Short Term Goal #1   Pt will avoid contact with her ongoing shingles/dermatistis to avoid the spread of her condition as much as possible over the next 30 days  THN CM Short Term Goal #1 Start Date  05/16/18  [facility] CM Short Term Goal #1 Met Date  07/04/18  THN CM Short Term Goal #2   Pt will use the precribed medication for her skin irritation over the next 30  days  THN CM Short Term Goal #2 Start Date  05/16/18  Interventions for Short Term Goal #2  Will continue to encourage pt to take the recommended medication to reoccurent outbreaks. Will extend to allow adherence.    THN CM Care Plan Problem Two     Most Recent Value  Care Plan Problem Two  Fall preventions  Role Documenting the Problem Two  Care Management Coordinator  Care Plan for Problem Two  Active  THN CM Short Term Goal #1   Pt will not have any falls over the next 30 days.  THN CM Short Term Goal #1 Start Date  05/16/18  [facility] CM Short Term Goal #1 Met Date   07/04/18      Raina Mina, RN Care Management Coordinator [place] Office [phone]

## 2018-07-16 DIAGNOSIS — Z79899 Other long term (current) drug therapy: Secondary | ICD-10-CM | POA: Diagnosis not present

## 2018-07-24 ENCOUNTER — Ambulatory Visit: Payer: Self-pay

## 2018-07-24 NOTE — Telephone Encounter (Signed)
Pt. Reports she has a productive cough with clear phlegm and wheezing. Started coughing last week.Started taking Mucinex, which is helping. States she will use her nebulizer today.Denies any fever. Has some shortness of breath with exertion. History of COPD. Can not come for an appointment today due to transportation. Appointment made for tomorrow.  Reason for Disposition . SEVERE coughing spells (e.g., whooping sound after coughing, vomiting after coughing)  Answer Assessment - Initial Assessment Questions 1. ONSET: "When did the cough begin?"      Last week 2. SEVERITY: "How bad is the cough today?"      Severe 3. RESPIRATORY DISTRESS: "Describe your breathing."      Shortness of breath with exertion 4. FEVER: "Do you have a fever?" If so, ask: "What is your temperature, how was it measured, and when did it start?"     No 5. SPUTUM: "Describe the color of your sputum" (clear, white, yellow, green)     Clear 6. HEMOPTYSIS: "Are you coughing up any blood?" If so ask: "How much?" (flecks, streaks, tablespoons, etc.)     No 7. CARDIAC HISTORY: "Do you have any history of heart disease?" (e.g., heart attack, congestive heart failure)      Pacemaker 8. LUNG HISTORY: "Do you have any history of lung disease?"  (e.g., pulmonary embolus, asthma, emphysema)     COPD 9. PE RISK FACTORS: "Do you have a history of blood clots?" (or: recent major surgery, recent prolonged travel, bedridden)     nO 10. OTHER SYMPTOMS: "Do you have any other symptoms?" (e.g., runny nose, wheezing, chest pain)       wHEEZING 11. PREGNANCY: "Is there any chance you are pregnant?" "When was your last menstrual period?"       N/A 12. TRAVEL: "Have you traveled out of the country in the last month?" (e.g., travel history, exposures)       No  Protocols used: [place]

## 2018-07-25 ENCOUNTER — Ambulatory Visit: Payer: PPO | Admitting: Family

## 2018-07-30 ENCOUNTER — Other Ambulatory Visit: Payer: Self-pay | Admitting: *Deleted

## 2018-07-30 NOTE — Patient Outreach (Signed)
[facility]) Care Management  07/30/2018  Jenna Marquez 06/02/1946 [id number]    RN received a call from pt with update on her ongoing management of care. Pt states her recent encounter of shingles as resolved and her dermatitis is reported as "much improved". States recent labs drawn were good with no reported issues. Pt states she has walker when needed but using it less. Medication reported has been filled with no issues with her daily dosing. No falls or injuries reported as RN discussed the current plan of care along with goals that have been met. Based upon pt's progress will discussed possible discharge and verify contact number for any future concerns or needed resources. Pt aware her provider will be notified of her disposition with [facility] services. No additional needs at this time as this case will be closed.  Raina Mina, RN Care Management Coordinator [place] Office [phone]

## 2018-07-30 NOTE — Patient Outreach (Signed)
[facility]) Care Management  07/30/2018  CREE NAPOLI 12-29-1945 [id number]    RN spoke with pt earlier in the day and she requested a call back. RN attempted to call back however only able to leave an approved  HIPAA voice message requesting a call back. Will further address pt's needs and update plan of care accordingly on any goals that have been met.   Raina Mina, RN Care Management Coordinator [place] Office [phone]

## 2018-08-01 ENCOUNTER — Ambulatory Visit: Payer: Self-pay | Admitting: *Deleted

## 2018-08-03 DIAGNOSIS — L84 Corns and callosities: Secondary | ICD-10-CM | POA: Diagnosis not present

## 2018-08-03 DIAGNOSIS — L2089 Other atopic dermatitis: Secondary | ICD-10-CM | POA: Diagnosis not present

## 2018-08-16 ENCOUNTER — Other Ambulatory Visit: Payer: Self-pay

## 2018-08-16 ENCOUNTER — Encounter (HOSPITAL_COMMUNITY): Payer: Self-pay

## 2018-08-16 ENCOUNTER — Emergency Department (HOSPITAL_COMMUNITY)
Admission: EM | Admit: 2018-08-16 | Discharge: 2018-08-16 | Disposition: A | Discharge status: Home / Self Care | Payer: PPO | Attending: Emergency Medicine | Admitting: Emergency Medicine

## 2018-08-16 ENCOUNTER — Emergency Department (HOSPITAL_COMMUNITY): Payer: PPO

## 2018-08-16 DIAGNOSIS — R112 Nausea with vomiting, unspecified: Secondary | ICD-10-CM | POA: Diagnosis not present

## 2018-08-16 DIAGNOSIS — R197 Diarrhea, unspecified: Secondary | ICD-10-CM | POA: Diagnosis not present

## 2018-08-16 DIAGNOSIS — Z7982 Long term (current) use of aspirin: Secondary | ICD-10-CM | POA: Insufficient documentation

## 2018-08-16 DIAGNOSIS — K5900 Constipation, unspecified: Secondary | ICD-10-CM | POA: Diagnosis not present

## 2018-08-16 DIAGNOSIS — R1032 Left lower quadrant pain: Secondary | ICD-10-CM | POA: Diagnosis not present

## 2018-08-16 DIAGNOSIS — J449 Chronic obstructive pulmonary disease, unspecified: Secondary | ICD-10-CM | POA: Insufficient documentation

## 2018-08-16 DIAGNOSIS — Z79899 Other long term (current) drug therapy: Secondary | ICD-10-CM | POA: Insufficient documentation

## 2018-08-16 DIAGNOSIS — R1084 Generalized abdominal pain: Secondary | ICD-10-CM | POA: Diagnosis not present

## 2018-08-16 DIAGNOSIS — R1111 Vomiting without nausea: Secondary | ICD-10-CM | POA: Diagnosis not present

## 2018-08-16 DIAGNOSIS — K5790 Diverticulosis of intestine, part unspecified, without perforation or abscess without bleeding: Secondary | ICD-10-CM | POA: Diagnosis not present

## 2018-08-16 DIAGNOSIS — K760 Fatty (change of) liver, not elsewhere classified: Secondary | ICD-10-CM | POA: Diagnosis not present

## 2018-08-16 DIAGNOSIS — Z87891 Personal history of nicotine dependence: Secondary | ICD-10-CM | POA: Diagnosis not present

## 2018-08-16 DIAGNOSIS — R52 Pain, unspecified: Secondary | ICD-10-CM | POA: Diagnosis not present

## 2018-08-16 LAB — COMPREHENSIVE METABOLIC PANEL
ALT: 27 U/L (ref 0–44)
AST: 76 U/L — ABNORMAL HIGH (ref 15–41)
Albumin: 3.9 g/dL (ref 3.5–5.0)
Alkaline Phosphatase: 121 U/L (ref 38–126)
Anion gap: 17 — ABNORMAL HIGH (ref 5–15)
BUN: 18 mg/dL (ref 8–23)
CO2: 20 mmol/L — ABNORMAL LOW (ref 22–32)
Calcium: 9.4 mg/dL (ref 8.9–10.3)
Chloride: 105 mmol/L (ref 98–111)
Creatinine, Ser: 1.03 mg/dL — ABNORMAL HIGH (ref 0.44–1.00)
GFR calc non Af Amer: 54 mL/min — ABNORMAL LOW (ref >60)
Glucose, Bld: 84 mg/dL (ref 70–99)
Potassium: 4.5 mmol/L (ref 3.5–5.1)
Sodium: 142 mmol/L (ref 135–145)
Total Bilirubin: 1.5 mg/dL — ABNORMAL HIGH (ref 0.3–1.2)
Total Protein: 7.8 g/dL (ref 6.5–8.1)

## 2018-08-16 LAB — LIPASE, BLOOD: Lipase: 38 U/L (ref 11–51)

## 2018-08-16 LAB — URINALYSIS, ROUTINE W REFLEX MICROSCOPIC
Bilirubin Urine: NEGATIVE
Glucose, UA: NEGATIVE mg/dL
Ketones, ur: 5 mg/dL — AB
NITRITE: NEGATIVE
PH: 5 (ref 5.0–8.0)
Protein, ur: NEGATIVE mg/dL
Specific Gravity, Urine: 1.015 (ref 1.005–1.030)

## 2018-08-16 LAB — CBC WITH DIFFERENTIAL/PLATELET
Abs Immature Granulocytes: 0.06 10*3/uL (ref 0.00–0.07)
Basophils Absolute: 0.1 10*3/uL (ref 0.0–0.1)
Basophils Relative: 1 %
Eosinophils Absolute: 0 10*3/uL (ref 0.0–0.5)
Eosinophils Relative: 0 %
HCT: 45.7 % (ref 36.0–46.0)
Hemoglobin: 15.3 g/dL — ABNORMAL HIGH (ref 12.0–15.0)
Immature Granulocytes: 1 %
LYMPHS PCT: 5 %
Lymphs Abs: 0.5 10*3/uL — ABNORMAL LOW (ref 0.7–4.0)
MCH: 37.1 pg — ABNORMAL HIGH (ref 26.0–34.0)
MCHC: 33.5 g/dL (ref 30.0–36.0)
MCV: 110.9 fL — ABNORMAL HIGH (ref 80.0–100.0)
Monocytes Absolute: 0.9 10*3/uL (ref 0.1–1.0)
Monocytes Relative: 8 %
NEUTROS ABS: 9.5 10*3/uL — AB (ref 1.7–7.7)
Neutrophils Relative %: 85 %
Platelets: 257 10*3/uL (ref 150–400)
RBC: 4.12 MIL/uL (ref 3.87–5.11)
RDW: 13.9 % (ref 11.5–15.5)
WBC: 11 10*3/uL — AB (ref 4.0–10.5)
nRBC: 0 % (ref 0.0–0.2)

## 2018-08-16 MED ORDER — ONDANSETRON 4 MG PO TBDP: 4.0000 mg | ORAL_TABLET | Freq: Four times a day (QID) | ORAL | 0 refills | Status: DC | PRN

## 2018-08-16 MED ORDER — IOPAMIDOL (ISOVUE-300) INJECTION 61%
INTRAVENOUS | Status: AC
  Filled 2018-08-16: qty 100

## 2018-08-16 MED ORDER — IOPAMIDOL (ISOVUE-300) INJECTION 61%
100.0000 mL | Freq: Once | INTRAVENOUS | Status: AC | PRN
  Administered 2018-08-16: 100 mL via INTRAVENOUS

## 2018-08-16 MED ORDER — FENTANYL CITRATE (PF) 100 MCG/2ML IJ SOLN
50.0000 ug | Freq: Once | INTRAMUSCULAR | Status: AC
  Administered 2018-08-16: 50 ug via INTRAVENOUS
  Filled 2018-08-16: qty 2

## 2018-08-16 MED ORDER — SODIUM CHLORIDE (PF) 0.9 % IJ SOLN
INTRAMUSCULAR | Status: AC
  Filled 2018-08-16: qty 50

## 2018-08-16 MED ORDER — ALBUTEROL SULFATE (2.5 MG/3ML) 0.083% IN NEBU
5.0000 mg | INHALATION_SOLUTION | Freq: Once | RESPIRATORY_TRACT | Status: AC
  Administered 2018-08-16: 5 mg via RESPIRATORY_TRACT
  Filled 2018-08-16: qty 6

## 2018-08-16 MED ORDER — DICYCLOMINE HCL 20 MG PO TABS: 20.0000 mg | ORAL_TABLET | Freq: Three times a day (TID) | ORAL | 0 refills | Status: DC | PRN

## 2018-08-16 MED ORDER — IPRATROPIUM BROMIDE 0.02 % IN SOLN
0.5000 mg | Freq: Once | RESPIRATORY_TRACT | Status: AC
  Administered 2018-08-16: 0.5 mg via RESPIRATORY_TRACT
  Filled 2018-08-16: qty 2.5

## 2018-08-16 MED ORDER — IOPAMIDOL (ISOVUE-M 300) INJECTION 61%: 15.0000 mL | Freq: Once | INTRAMUSCULAR | Status: DC | PRN

## 2018-08-16 MED ORDER — LOPERAMIDE HCL 2 MG PO CAPS
4.0000 mg | ORAL_CAPSULE | Freq: Once | ORAL | Status: AC
  Administered 2018-08-16: 4 mg via ORAL
  Filled 2018-08-16: qty 2

## 2018-08-16 MED ORDER — ONDANSETRON HCL 4 MG/2ML IJ SOLN
4.0000 mg | Freq: Once | INTRAMUSCULAR | Status: AC
  Administered 2018-08-16: 4 mg via INTRAVENOUS
  Filled 2018-08-16: qty 2

## 2018-08-16 NOTE — ED Provider Notes (Addendum)
TIME SEEN: 3:43 AM  CHIEF COMPLAINT: Abdominal pain  HPI: Patient is a 73 year old female with history of second-degree AV block, COPD, anxiety who presents to the emergency department for complaints of abdominal pain, nausea and vomiting.  States symptoms started tonight and most of the pain is in the left lower quadrant.  States that her last bowel movement was this morning but was smaller than normal.  Did have a normal bowel movement yesterday.  No bloody stools or melena.  No dysuria or hematuria.  No known fevers.  No previous abdominal surgeries.  ROS: See HPI Constitutional: no fever  Eyes: no drainage  ENT: no runny nose   Cardiovascular:  no chest pain  Resp: no SOB  GI:  vomiting GU: no dysuria Integumentary: no rash  Allergy: no hives  Musculoskeletal: no leg swelling  Neurological: no slurred speech ROS otherwise negative  PAST MEDICAL HISTORY/PAST SURGICAL HISTORY:  Past Medical History:  Diagnosis Date  . Allergic rhinitis   . Anxiety    "occasionally"  . Asthma   . COPD (chronic obstructive pulmonary disease) ([place])   . GERD (gastroesophageal reflux disease)   . Mobitz type 2 second degree AV block 01/06/2015    MEDICATIONS:  Prior to Admission medications   Medication Sig Start Date End Date Taking? Authorizing Provider  acyclovir ointment (ZOVIRAX) 5 % Apply 1 application topically 3 (three) times daily. 05/11/18   Colon Branch, MD  albuterol [facility] HFA) [phone] Base) MCG/ACT inhaler Inhale 1-2 puffs into the lungs every 6 (six) hours as needed for wheezing or shortness of breath. 12/21/17   Parrett, Fonnie Mu, NP  albuterol (PROVENTIL) (2.5 MG/3ML) 0.083% nebulizer solution Take 3 mLs (2.5 mg total) by nebulization every 6 (six) hours as needed for wheezing or shortness of breath. 04/24/18   Parrett, Tammy S, NP  albuterol (PROVENTIL) (2.5 MG/3ML) 0.083% nebulizer solution INHALE CONTENTS OF 1 VIAL VIA NEBULIZER 4 TIMES DAILY 06/19/18   Rigoberto Noel, MD  ALPRAZolam  Duanne Moron) 0.5 MG tablet Take 1 tablet (0.5 mg total) by mouth every 8 (eight) hours as needed for anxiety (**DO NOT TAKE AT NIGHT WITH HYDROXYZINE**). Do not take with alcohol 07/04/17   Saguier, Percell Miller, PA-C  aspirin EC 81 MG EC tablet Take 1 tablet (81 mg total) by mouth daily. 10/02/16   Nita Sells, MD  budesonide-formoterol (SYMBICORT) 160-4.5 MCG/ACT inhaler Inhale 2 puffs into the lungs 2 (two) times daily. 10/27/17   Parrett, Fonnie Mu, NP  calcium-vitamin D (OSCAL WITH D) 250-125 MG-UNIT tablet Take 1 tablet by mouth daily.    [provider]  Cholecalciferol (VITAMIN D) 2000 UNITS CAPS Take 1 capsule by mouth daily.    [provider]  cyproheptadine (PERIACTIN) 4 MG tablet Take 4 mg by mouth 2 (two) times daily.    [provider]  fluocinonide cream (LIDEX) 9.81 % Apply 1 application topically 2 (two) times daily.    [provider]  folic acid (FOLVITE) 1 MG tablet Take 1 mg by mouth daily. 05/01/18   [provider]  gabapentin (NEURONTIN) 100 MG capsule TAKE 1 CAPSULE BY MOUTH TWICE A DAY 06/26/18   Copland, Gay Filler, MD  hydrOXYzine (ATARAX/VISTARIL) 10 MG tablet TAKE 2 TABLETS BY MOUTH AS NEEDED FOR ITCHING. DO NOT TAKE WITH ALPRAZOLAM 06/18/18   Copland, Gay Filler, MD  ibuprofen (ADVIL,MOTRIN) 800 MG tablet Take 1 tablet (800 mg total) by mouth 3 (three) times daily. Use only on occasion for pain 05/23/18  Copland, Gay Filler, MD  ipratropium (ATROVENT) 0.02 % nebulizer solution TAKE 2.5 MLS BY NEBULIZATION EVERY 6 HOURS AS NEEDED FOR WHEEZING OR SHORTNESS OF BREATH. 05/17/18   Chesley Mires, MD  methotrexate (RHEUMATREX) 2.5 MG tablet Take 2.5 mg by mouth. 05/01/18   [provider]  metoprolol tartrate (LOPRESSOR) 25 MG tablet Take 1 tablet (25 mg total) by mouth 2 (two) times daily. 10/10/17 06/01/18  Camnitz, Ocie Doyne, MD  Multiple Vitamin (MULTIVITAMIN) tablet Take 2 tablets by mouth every morning.     [provider]   triamcinolone ointment (KENALOG) 0.1 % APPLY TO AFFECTED AREA TWICE A DAY 07/10/17   [provider]    ALLERGIES:  Allergies  Allergen Reactions  . Avelox [Moxifloxacin Hcl In Nacl] Itching and Rash    SOCIAL HISTORY:  Social History   Tobacco Use  . Smoking status: Former Smoker    Packs/day: 1.00    Years: 35.00    Pack years: 35.00    Types: Cigarettes    Last attempt to quit: 08/10/2012    Years since quitting: 6.0  . Smokeless tobacco: Never Used  . Tobacco comment: Started at age 9  Substance Use Topics  . Alcohol use: Yes    Alcohol/week: 8.0 standard drinks    Types: 8 Glasses of wine per week    FAMILY HISTORY: Family History  Problem Relation Age of Onset  . Coronary artery disease Mother   . Heart disease Mother        CHF  . Diabetes Neg Hx   . Cancer Neg Hx   . Stroke Neg Hx   . Colon cancer Neg Hx     EXAM: BP (!) 154/69 (BP Location: Left Arm)   Pulse 93   Temp 97.7 F (36.5 C) (Oral)   Resp 16   Ht 5\' 6"  (1.676 m)   Wt 58.5 kg   SpO2 98%   BMI 20.82 kg/m  CONSTITUTIONAL: Alert and oriented and responds appropriately to questions.  Elderly, in no distress, afebrile HEAD: Normocephalic EYES: Conjunctivae clear, pupils appear equal, EOMI ENT: normal nose; moist mucous membranes NECK: Supple, no meningismus, no nuchal rigidity, no LAD  CARD: RRR; S1 and S2 appreciated; no murmurs, no clicks, no rubs, no gallops RESP: Normal chest excursion without splinting or tachypnea; breath sounds clear and equal bilaterally; no wheezes, no rhonchi, no rales, no hypoxia or respiratory distress, speaking full sentences ABD/GI: Normal bowel sounds; non-distended; soft, tender to palpation throughout the abdomen but more so in the left lower quadrant, no rebound, no guarding, no peritoneal signs, no hepatosplenomegaly BACK:  The back appears normal and is non-tender to palpation, there is no CVA tenderness EXT: Normal ROM in all joints; non-tender  to palpation; no edema; normal capillary refill; no cyanosis, no calf tenderness or swelling    SKIN: Normal color for age and race; warm; no rash NEURO: Moves all extremities equally PSYCH: The patient's mood and manner are appropriate. Grooming and personal hygiene are appropriate.  MEDICAL DECISION MAKING: Patient here with abdominal pain, nausea and vomiting.  Differential includes diverticulitis, bowel obstruction, colitis, constipation, viral gastroenteritis, UTI, pyelonephritis, kidney stone.  Will obtain labs, urine and a CT of her abdomen pelvis.  Will give pain and nausea medicine.  ED PROGRESS: Patient now having multiple episodes of diarrhea.  Will give Imodium.  I suspect now that this is more likely viral gastroenteritis.  She also now reports feeling short of breath and has history of COPD.  Reports this is chronic for her.  She is diminished at her bases bilaterally with scattered expiratory wheezes.  Will give albuterol, Atrovent.   7:35 AM  Pt's labs and urine are unremarkable.  CT scan pending.    7:45 AM  Pt's CT scan is unremarkable.  Suspect viral gastroenteritis.  Will discharge home with Zofran, Bentyl and instructions to use Tylenol and Imodium over-the-counter as needed.   At this time, I do not feel there is any life-threatening condition present. I have reviewed and discussed all results (EKG, imaging, lab, urine as appropriate) and exam findings with patient/family. I have reviewed nursing notes and appropriate previous records.  I feel the patient is safe to be discharged home without further emergent workup and can continue workup as an outpatient as needed. Discussed usual and customary return precautions. Patient/family verbalize understanding and are comfortable with this plan.  Outpatient follow-up has been provided as needed. All questions have been answered.      Mariluz Crespo, Delice Bison, DO 08/16/18 [phone]

## 2018-08-16 NOTE — ED Notes (Signed)
Bed: SU86 Expected date:  Expected time:  Means of arrival:  Comments: EMS 73 yo female from home/generalized abdominal pain x 1 hour 140/70 HR 88 CBG 89

## 2018-08-16 NOTE — ED Notes (Signed)
Urine culture sent to the lab. 

## 2018-08-16 NOTE — ED Notes (Signed)
Patient sitting on bedside commode.

## 2018-08-16 NOTE — Discharge Instructions (Signed)
You may use Tylenol 1000 mg every 6 hours as needed for fever and pain.  You may use over-the-counter Imodium as needed for diarrhea.

## 2018-08-16 NOTE — ED Notes (Signed)
Patient transported to CT 

## 2018-08-16 NOTE — ED Triage Notes (Signed)
Per EMS, patient coming from home with complaints of abdominal pain started about 30 minutes prior to calling 911. She cannot describe what it feels like but states that it hurts above her belly button, but EMS noticed tenderness in all four quadrants.   Attempted to have a bowel movement but was not successful. Had a bowel movement earlier that was smaller than normal. Last normal bowel movement was yesterday.  Denies nausea but has vomited twice. Was dry heaving in the ambulance but was not able to get anything up.

## 2018-09-03 ENCOUNTER — Ambulatory Visit: Payer: Self-pay | Admitting: Family Medicine

## 2018-09-06 ENCOUNTER — Ambulatory Visit: Payer: Self-pay | Admitting: Family Medicine

## 2018-09-08 NOTE — Progress Notes (Deleted)
[place] at [facility] [address], [place], [place] [address] [phone]  Date:  09/12/2018   Name:  Jenna Marquez   DOB:  05-May-1946   MRN:  [id number]  PCP:  Darreld Mclean, MD    Chief Complaint: No chief complaint on file.   History of Present Illness:  Jenna Marquez is a 73 y.o. very pleasant female patient who presents with the following:  Jenna Marquez is here for a periodic follow-up visit today.  She has a history of peripheral vascular disease, COPD, chronic kidney disease, heart block with pacemaker, paroxysmal A. fib, hypertension, anxiety and tobacco abuse  I last saw her in the office in October, to follow-up for shingles on her scalp She was in the ER at the beginning of this month with abdominal pain, nausea and vomiting She had an abdominal CT which looked okay, was thought to have viral gastroenteritis and was released to home  Most recent pulmonology visit was in September; note mentions of severe COPD with Symbicort twice daily and DuoNeb as needed She does use oxygen at 2 L with activity as needed  She should be due for a follow-up visit with cardiology soon  Colon cancer screening: Tetanus vaccine Patient Active Problem List   Diagnosis Date Noted  . Chronic respiratory failure with hypoxia ([place]) 04/24/2018  . MDD (major depressive disorder), single episode, severe , no psychosis ([place])   . Hemoptysis 02/19/2018  . Community acquired pneumonia 02/19/2018  . Vagina bleeding 02/19/2018  . Alcohol abuse 02/19/2018  . Left knee pain 07/11/2017  . PVD (peripheral vascular disease) ([place]) 04/04/2017  . HTN (hypertension) 11/24/2016  . Paroxysmal atrial fibrillation ([place]) 09/30/2016  . Mobitz type 2 second degree AV block 09/30/2016  . Swelling of arm 05/22/2015  . Left shoulder pain 05/22/2015  . CKD (chronic kidney disease) stage 3, GFR 30-59 ml/min ([facility]) 05/11/2015  . Pacemaker 04/22/2015  .  Generalized anxiety disorder 01/11/2012  . 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   . Tobacco abuse     Past Medical History:  Diagnosis Date  . Allergic rhinitis   . Anxiety    "occasionally"  . Asthma   . COPD (chronic obstructive pulmonary disease) ([place])   . GERD (gastroesophageal reflux disease)   . Mobitz type 2 second degree AV block 01/06/2015    Past Surgical History:  Procedure Laterality Date  . [place]  . EP IMPLANTABLE DEVICE N/A 01/07/2015   Procedure: Pacemaker Implant;  Surgeon: Evans Lance, MD;  Location: [facility];  Service: Cardiovascular;  Laterality: N/A;  . PACEMAKER INSERTION    . TONSILLECTOMY AND ADENOIDECTOMY  1954    Social History   Tobacco Use  . Smoking status: Former Smoker    Packs/day: 1.00    Years: 35.00    Pack years: 35.00    Types: Cigarettes    Last attempt to quit: 08/10/2012    Years since quitting: 6.0  . Smokeless tobacco: Never Used  . Tobacco comment: Started at age 64  Substance Use Topics  . Alcohol use: Yes    Alcohol/week: 8.0 standard drinks    Types: 8 Glasses of wine per week  . Drug use: No    Family History  Problem Relation Age of Onset  . Coronary artery disease Mother   . Heart disease Mother        CHF  .  Diabetes Neg Hx   . Cancer Neg Hx   . Stroke Neg Hx   . Colon cancer Neg Hx     Allergies  Allergen Reactions  . Avelox [Moxifloxacin Hcl In Nacl] Itching and Rash    Medication list has been reviewed and updated.  Current Outpatient Medications on File Prior to Visit  Medication Sig Dispense Refill  . acyclovir ointment (ZOVIRAX) 5 % Apply 1 application topically 3 (three) times daily. (Patient not taking: Reported on 08/16/2018) 30 g 0  . albuterol (PROAIR HFA) 108 (90 Base) MCG/ACT inhaler Inhale 1-2 puffs into the lungs every 6 (six) hours as needed for wheezing or shortness of breath. 1 Inhaler 3  . albuterol (PROVENTIL) (2.5 MG/3ML)  0.083% nebulizer solution Take 3 mLs (2.5 mg total) by nebulization every 6 (six) hours as needed for wheezing or shortness of breath. 75 mL 12  . albuterol (PROVENTIL) (2.5 MG/3ML) 0.083% nebulizer solution INHALE CONTENTS OF 1 VIAL VIA NEBULIZER 4 TIMES DAILY (Patient not taking: Reported on 08/16/2018) 375 mL 2  . ALPRAZolam (XANAX) 0.5 MG tablet Take 1 tablet (0.5 mg total) by mouth every 8 (eight) hours as needed for anxiety (**DO NOT TAKE AT NIGHT WITH HYDROXYZINE**). Do not take with alcohol (Patient not taking: Reported on 08/16/2018) 15 tablet 0  . aspirin EC 81 MG EC tablet Take 1 tablet (81 mg total) by mouth daily. (Patient not taking: Reported on 08/16/2018) 30 tablet 0  . budesonide-formoterol (SYMBICORT) 160-4.5 MCG/ACT inhaler Inhale 2 puffs into the lungs 2 (two) times daily. 3 Inhaler 1  . calcium-vitamin D (OSCAL WITH D) 250-125 MG-UNIT tablet Take 1 tablet by mouth daily.    . Cholecalciferol (VITAMIN D) 2000 UNITS CAPS Take 1 capsule by mouth daily.    Marland Kitchen dicyclomine (BENTYL) 20 MG tablet Take 1 tablet (20 mg total) by mouth every 8 (eight) hours as needed for spasms (Abdominal cramping). 15 tablet 0  . fluocinonide cream (LIDEX) 6.46 % Apply 1 application topically 2 (two) times daily.    Marland Kitchen gabapentin (NEURONTIN) 100 MG capsule TAKE 1 CAPSULE BY MOUTH TWICE A DAY (Patient not taking: Reported on 08/16/2018) 180 capsule 1  . hydrOXYzine (ATARAX/VISTARIL) 10 MG tablet TAKE 2 TABLETS BY MOUTH AS NEEDED FOR ITCHING. DO NOT TAKE WITH ALPRAZOLAM (Patient not taking: Reported on 08/16/2018) 180 tablet 2  . ibuprofen (ADVIL,MOTRIN) 800 MG tablet Take 1 tablet (800 mg total) by mouth 3 (three) times daily. Use only on occasion for pain 30 tablet 0  . ipratropium (ATROVENT) 0.02 % nebulizer solution TAKE 2.5 MLS BY NEBULIZATION EVERY 6 HOURS AS NEEDED FOR WHEEZING OR SHORTNESS OF BREATH. (Patient taking differently: Take 2.5 mLs by nebulization every 6 (six) hours as needed for wheezing or shortness of  breath. ) 62.5 mL 5  . methotrexate (RHEUMATREX) 2.5 MG tablet Take 15 mg by mouth once a week.   0  . metoprolol tartrate (LOPRESSOR) 25 MG tablet Take 1 tablet (25 mg total) by mouth 2 (two) times daily. (Patient not taking: Reported on 08/16/2018) 180 tablet 2  . ondansetron (ZOFRAN ODT) 4 MG disintegrating tablet Take 1 tablet (4 mg total) by mouth every 6 (six) hours as needed for nausea or vomiting. 20 tablet 0  . triamcinolone ointment (KENALOG) 0.1 % Apply 1 application topically 2 (two) times daily.   0   No current facility-administered medications on file prior to visit.     Review of Systems:  As per HPI- otherwise negative.  Physical Examination: There were no vitals filed for this visit. There were no vitals filed for this visit. There is no height or weight on file to calculate BMI. Ideal Body Weight:    GEN: WDWN, NAD, Non-toxic, A & O x 3 HEENT: Atraumatic, Normocephalic. Neck supple. No masses, No LAD. Ears and Nose: No external deformity. CV: RRR, No M/G/R. No JVD. No thrill. No extra heart sounds. PULM: CTA B, no wheezes, crackles, rhonchi. No retractions. No resp. distress. No accessory muscle use. ABD: S, NT, ND, +BS. No rebound. No HSM. EXTR: No c/c/e NEURO Normal gait.  PSYCH: Normally interactive. Conversant. Not depressed or anxious appearing.  Calm demeanor.    Assessment and Plan: ***  Signed Lamar Blinks, MD

## 2018-09-10 ENCOUNTER — Ambulatory Visit: Payer: Self-pay | Admitting: Family Medicine

## 2018-09-12 ENCOUNTER — Ambulatory Visit: Payer: Self-pay | Admitting: Family Medicine

## 2018-09-13 ENCOUNTER — Other Ambulatory Visit: Payer: Self-pay | Admitting: Pulmonary Disease

## 2018-09-13 ENCOUNTER — Other Ambulatory Visit: Payer: Self-pay | Admitting: *Deleted

## 2018-09-13 MED ORDER — IPRATROPIUM BROMIDE 0.02 % IN SOLN: RESPIRATORY_TRACT | 0 refills | Status: DC

## 2018-09-14 ENCOUNTER — Telehealth: Payer: Self-pay | Admitting: Pulmonary Disease

## 2018-09-14 NOTE — Telephone Encounter (Signed)
Called and spoke with Patient. Prescription for ipratropium neb sent to requested pharmacy 09/13/18, by Waldemar Dickens, CMA. Patient aware prescription is at pharmacy.  Nothing further at this time.

## 2018-09-14 NOTE — Telephone Encounter (Signed)
Pt said the pharmacy had the prescription. Pt said no need to call her back.

## 2018-09-23 NOTE — Progress Notes (Deleted)
[place] at [facility] [address], [place], [place] [address] [phone]  Date:  09/26/2018   Name:  Jenna Marquez   DOB:  07-30-1946   MRN:  [id number]  PCP:  Darreld Mclean, MD    Chief Complaint: No chief complaint on file.   History of Present Illness:  Jenna Marquez is a 73 y.o. very pleasant female patient who presents with the following:  Keilynn is here for periodic follow-up visit today She has a history of depression and anxiety, as well as alcohol abuse. Also, paroxysmal A. fib, hypertension, peripheral arterial disease, COPD, chronic kidney disease stage III, Mobitz type II second-degree with pacemaker She has history of A. fib, but has refused anticoagulation She has been following up with pulmonology, but is well overdue for cardiology visit  I last saw her in the office in October at which point she had recently had shingles on her scalp Flu shot is up-to-date, can recommend Shingrix and also a tetanus vaccine at her drugstore She is also overdue for colon cancer screening; last colonoscopy was in 2014, she was asked to follow-up in 5 years  Patient Active Problem List   Diagnosis Date Noted  . Chronic respiratory failure with hypoxia ([place]) 04/24/2018  . MDD (major depressive disorder), single episode, severe , no psychosis ([place])   . Hemoptysis 02/19/2018  . Community acquired pneumonia 02/19/2018  . Vagina bleeding 02/19/2018  . Alcohol abuse 02/19/2018  . Left knee pain 07/11/2017  . PVD (peripheral vascular disease) ([place]) 04/04/2017  . HTN (hypertension) 11/24/2016  . Paroxysmal atrial fibrillation ([place]) 09/30/2016  . Mobitz type 2 second degree AV block 09/30/2016  . Swelling of arm 05/22/2015  . Left shoulder pain 05/22/2015  . CKD (chronic kidney disease) stage 3, GFR 30-59 ml/min ([facility]) 05/11/2015  . Pacemaker 04/22/2015  . Generalized anxiety disorder 01/11/2012  . COPD (chronic  obstructive pulmonary disease) gold stage C.   . Tobacco abuse     Past Medical History:  Diagnosis Date  . Allergic rhinitis   . Anxiety    "occasionally"  . Asthma   . COPD (chronic obstructive pulmonary disease) ([place])   . GERD (gastroesophageal reflux disease)   . Mobitz type 2 second degree AV block 01/06/2015    Past Surgical History:  Procedure Laterality Date  . [place]  . EP IMPLANTABLE DEVICE N/A 01/07/2015   Procedure: Pacemaker Implant;  Surgeon: Evans Lance, MD;  Location: [facility];  Service: Cardiovascular;  Laterality: N/A;  . PACEMAKER INSERTION    . TONSILLECTOMY AND ADENOIDECTOMY  1954    Social History   Tobacco Use  . Smoking status: Former Smoker    Packs/day: 1.00    Years: 35.00    Pack years: 35.00    Types: Cigarettes    Last attempt to quit: 08/10/2012    Years since quitting: 6.1  . Smokeless tobacco: Never Used  . Tobacco comment: Started at age 5  Substance Use Topics  . Alcohol use: Yes    Alcohol/week: 8.0 standard drinks    Types: 8 Glasses of wine per week  . Drug use: No    Family History  Problem Relation Age of Onset  . Coronary artery disease Mother   . Heart disease Mother        CHF  . Diabetes Neg Hx   . Cancer Neg Hx   . Stroke  Neg Hx   . Colon cancer Neg Hx     Allergies  Allergen Reactions  . Avelox [Moxifloxacin Hcl In Nacl] Itching and Rash    Medication list has been reviewed and updated.  Current Outpatient Medications on File Prior to Visit  Medication Sig Dispense Refill  . acyclovir ointment (ZOVIRAX) 5 % Apply 1 application topically 3 (three) times daily. (Patient not taking: Reported on 08/16/2018) 30 g 0  . albuterol (PROAIR HFA) 108 (90 Base) MCG/ACT inhaler Inhale 1-2 puffs into the lungs every 6 (six) hours as needed for wheezing or shortness of breath. 1 Inhaler 3  . albuterol (PROVENTIL) (2.5 MG/3ML) 0.083% nebulizer solution Take 3 mLs (2.5 mg total) by  nebulization every 6 (six) hours as needed for wheezing or shortness of breath. 75 mL 12  . albuterol (PROVENTIL) (2.5 MG/3ML) 0.083% nebulizer solution INHALE CONTENTS OF 1 VIAL VIA NEBULIZER 4 TIMES DAILY (Patient not taking: Reported on 08/16/2018) 375 mL 2  . ALPRAZolam (XANAX) 0.5 MG tablet Take 1 tablet (0.5 mg total) by mouth every 8 (eight) hours as needed for anxiety (**DO NOT TAKE AT NIGHT WITH HYDROXYZINE**). Do not take with alcohol (Patient not taking: Reported on 08/16/2018) 15 tablet 0  . aspirin EC 81 MG EC tablet Take 1 tablet (81 mg total) by mouth daily. (Patient not taking: Reported on 08/16/2018) 30 tablet 0  . budesonide-formoterol (SYMBICORT) 160-4.5 MCG/ACT inhaler Inhale 2 puffs into the lungs 2 (two) times daily. 3 Inhaler 1  . calcium-vitamin D (OSCAL WITH D) 250-125 MG-UNIT tablet Take 1 tablet by mouth daily.    . Cholecalciferol (VITAMIN D) 2000 UNITS CAPS Take 1 capsule by mouth daily.    Marland Kitchen dicyclomine (BENTYL) 20 MG tablet Take 1 tablet (20 mg total) by mouth every 8 (eight) hours as needed for spasms (Abdominal cramping). 15 tablet 0  . fluocinonide cream (LIDEX) 8.14 % Apply 1 application topically 2 (two) times daily.    Marland Kitchen gabapentin (NEURONTIN) 100 MG capsule TAKE 1 CAPSULE BY MOUTH TWICE A DAY (Patient not taking: Reported on 08/16/2018) 180 capsule 1  . hydrOXYzine (ATARAX/VISTARIL) 10 MG tablet TAKE 2 TABLETS BY MOUTH AS NEEDED FOR ITCHING. DO NOT TAKE WITH ALPRAZOLAM (Patient not taking: Reported on 08/16/2018) 180 tablet 2  . ibuprofen (ADVIL,MOTRIN) 800 MG tablet Take 1 tablet (800 mg total) by mouth 3 (three) times daily. Use only on occasion for pain 30 tablet 0  . ipratropium (ATROVENT) 0.02 % nebulizer solution TAKE 2.5 MLS BY NEBULIZATION EVERY 6 HOURS AS NEEDED FOR WHEEZING OR SHORTNESS OF BREATH. 62.5 mL 0  . methotrexate (RHEUMATREX) 2.5 MG tablet Take 15 mg by mouth once a week.   0  . metoprolol tartrate (LOPRESSOR) 25 MG tablet Take 1 tablet (25 mg total) by  mouth 2 (two) times daily. (Patient not taking: Reported on 08/16/2018) 180 tablet 2  . ondansetron (ZOFRAN ODT) 4 MG disintegrating tablet Take 1 tablet (4 mg total) by mouth every 6 (six) hours as needed for nausea or vomiting. 20 tablet 0  . triamcinolone ointment (KENALOG) 0.1 % Apply 1 application topically 2 (two) times daily.   0   No current facility-administered medications on file prior to visit.     Review of Systems:  As per HPI- otherwise negative.   Physical Examination: There were no vitals filed for this visit. There were no vitals filed for this visit. There is no height or weight on file to calculate BMI. Ideal Body Weight:  GEN: WDWN, NAD, Non-toxic, A & O x 3 HEENT: Atraumatic, Normocephalic. Neck supple. No masses, No LAD. Ears and Nose: No external deformity. CV: RRR, No M/G/R. No JVD. No thrill. No extra heart sounds. PULM: CTA B, no wheezes, crackles, rhonchi. No retractions. No resp. distress. No accessory muscle use. ABD: S, NT, ND, +BS. No rebound. No HSM. EXTR: No c/c/e NEURO Normal gait.  PSYCH: Normally interactive. Conversant. Not depressed or anxious appearing.  Calm demeanor.    Assessment and Plan: ***  Signed Lamar Blinks, MD

## 2018-09-26 ENCOUNTER — Ambulatory Visit: Payer: Self-pay | Admitting: Family Medicine

## 2018-10-01 ENCOUNTER — Other Ambulatory Visit: Payer: Self-pay | Admitting: Adult Health

## 2018-10-01 NOTE — Progress Notes (Deleted)
[place] at [facility] [address] [place], [place] [address] [phone]  Date:  10/03/2018   Name:  Jenna Marquez   DOB:  January 22, 1946   MRN:  [id number]  PCP:  Darreld Mclean, MD    Chief Complaint: No chief complaint on file.   History of Present Illness:  Jenna Marquez is a 73 y.o. very pleasant female patient who presents with the following:  Here today for periodic follow-up visit History of COPD, chronic kidney disease, hypertension, atrial fib and second-degree AV block status post pacemaker, anxiety, alcohol abuse She was seen in the ER on January 2 with likely viral gastroenteritis Previously I had seen her in October, for shingles on her scalp  Tetanus vaccine: Colon cancer screening:  Patient Active Problem List   Diagnosis Date Noted  . Chronic respiratory failure with hypoxia ([place]) 04/24/2018  . MDD (major depressive disorder), single episode, severe , no psychosis ([place])   . Hemoptysis 02/19/2018  . Community acquired pneumonia 02/19/2018  . Vagina bleeding 02/19/2018  . Alcohol abuse 02/19/2018  . Left knee pain 07/11/2017  . PVD (peripheral vascular disease) ([place]) 04/04/2017  . HTN (hypertension) 11/24/2016  . Paroxysmal atrial fibrillation ([place]) 09/30/2016  . Mobitz type 2 second degree AV block 09/30/2016  . Swelling of arm 05/22/2015  . Left shoulder pain 05/22/2015  . CKD (chronic kidney disease) stage 3, GFR 30-59 ml/min (HCC) 05/11/2015  . Pacemaker 04/22/2015  . Generalized anxiety disorder 01/11/2012  . 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   . Tobacco abuse     Past Medical History:  Diagnosis Date  . Allergic rhinitis   . Anxiety    "occasionally"  . Asthma   . COPD (chronic obstructive pulmonary disease) ([place])   . GERD (gastroesophageal reflux disease)   . Mobitz type 2 second degree AV block 01/06/2015    Past Surgical History:  Procedure Laterality  Date  . [place]  . EP IMPLANTABLE DEVICE N/A 01/07/2015   Procedure: Pacemaker Implant;  Surgeon: Evans Lance, MD;  Location: [place] [facility];  Service: Cardiovascular;  Laterality: N/A;  . PACEMAKER INSERTION    . TONSILLECTOMY AND ADENOIDECTOMY  1954    Social History   Tobacco Use  . Smoking status: Former Smoker    Packs/day: 1.00    Years: 35.00    Pack years: 35.00    Types: Cigarettes    Last attempt to quit: 08/10/2012    Years since quitting: 6.1  . Smokeless tobacco: Never Used  . Tobacco comment: Started at age 75  Substance Use Topics  . Alcohol use: Yes    Alcohol/week: 8.0 standard drinks    Types: 8 Glasses of wine per week  . Drug use: No    Family History  Problem Relation Age of Onset  . Coronary artery disease Mother   . Heart disease Mother        CHF  . Diabetes Neg Hx   . Cancer Neg Hx   . Stroke Neg Hx   . Colon cancer Neg Hx     Allergies  Allergen Reactions  . Avelox [Moxifloxacin Hcl In Nacl] Itching and Rash    Medication list has been reviewed and updated.  Current Outpatient Medications on File Prior to Visit  Medication Sig Dispense Refill  . acyclovir ointment (ZOVIRAX) 5 % Apply 1 application topically 3 (three) times daily. (  Patient not taking: Reported on 08/16/2018) 30 g 0  . albuterol (PROAIR HFA) 108 (90 Base) MCG/ACT inhaler Inhale 1-2 puffs into the lungs every 6 (six) hours as needed for wheezing or shortness of breath. 1 Inhaler 3  . albuterol (PROVENTIL) (2.5 MG/3ML) 0.083% nebulizer solution Take 3 mLs (2.5 mg total) by nebulization every 6 (six) hours as needed for wheezing or shortness of breath. 75 mL 12  . albuterol (PROVENTIL) (2.5 MG/3ML) 0.083% nebulizer solution INHALE CONTENTS OF 1 VIAL VIA NEBULIZER 4 TIMES DAILY (Patient not taking: Reported on 08/16/2018) 375 mL 2  . ALPRAZolam (XANAX) 0.5 MG tablet Take 1 tablet (0.5 mg total) by mouth every 8 (eight) hours as needed for anxiety (**DO  NOT TAKE AT NIGHT WITH HYDROXYZINE**). Do not take with alcohol (Patient not taking: Reported on 08/16/2018) 15 tablet 0  . aspirin EC 81 MG EC tablet Take 1 tablet (81 mg total) by mouth daily. (Patient not taking: Reported on 08/16/2018) 30 tablet 0  . budesonide-formoterol (SYMBICORT) 160-4.5 MCG/ACT inhaler Inhale 2 puffs into the lungs 2 (two) times daily. 3 Inhaler 1  . calcium-vitamin D (OSCAL WITH D) 250-125 MG-UNIT tablet Take 1 tablet by mouth daily.    . Cholecalciferol (VITAMIN D) 2000 UNITS CAPS Take 1 capsule by mouth daily.    Marland Kitchen dicyclomine (BENTYL) 20 MG tablet Take 1 tablet (20 mg total) by mouth every 8 (eight) hours as needed for spasms (Abdominal cramping). 15 tablet 0  . fluocinonide cream (LIDEX) 0.93 % Apply 1 application topically 2 (two) times daily.    Marland Kitchen gabapentin (NEURONTIN) 100 MG capsule TAKE 1 CAPSULE BY MOUTH TWICE A DAY (Patient not taking: Reported on 08/16/2018) 180 capsule 1  . hydrOXYzine (ATARAX/VISTARIL) 10 MG tablet TAKE 2 TABLETS BY MOUTH AS NEEDED FOR ITCHING. DO NOT TAKE WITH ALPRAZOLAM (Patient not taking: Reported on 08/16/2018) 180 tablet 2  . ibuprofen (ADVIL,MOTRIN) 800 MG tablet Take 1 tablet (800 mg total) by mouth 3 (three) times daily. Use only on occasion for pain 30 tablet 0  . ipratropium (ATROVENT) 0.02 % nebulizer solution TAKE 2.5 MLS BY NEBULIZATION EVERY 6 HOURS AS NEEDED FOR WHEEZING OR SHORTNESS OF BREATH. 62.5 mL 0  . methotrexate (RHEUMATREX) 2.5 MG tablet Take 15 mg by mouth once a week.   0  . metoprolol tartrate (LOPRESSOR) 25 MG tablet Take 1 tablet (25 mg total) by mouth 2 (two) times daily. (Patient not taking: Reported on 08/16/2018) 180 tablet 2  . ondansetron (ZOFRAN ODT) 4 MG disintegrating tablet Take 1 tablet (4 mg total) by mouth every 6 (six) hours as needed for nausea or vomiting. 20 tablet 0  . triamcinolone ointment (KENALOG) 0.1 % Apply 1 application topically 2 (two) times daily.   0   No current facility-administered  medications on file prior to visit.     Review of Systems:  As per HPI- otherwise negative.   Physical Examination: There were no vitals filed for this visit. There were no vitals filed for this visit. There is no height or weight on file to calculate BMI. Ideal Body Weight:    GEN: WDWN, NAD, Non-toxic, A & O x 3 HEENT: Atraumatic, Normocephalic. Neck supple. No masses, No LAD. Ears and Nose: No external deformity. CV: RRR, No M/G/R. No JVD. No thrill. No extra heart sounds. PULM: CTA B, no wheezes, crackles, rhonchi. No retractions. No resp. distress. No accessory muscle use. ABD: S, NT, ND, +BS. No rebound. No HSM. EXTR: No c/c/e  NEURO Normal gait.  PSYCH: Normally interactive. Conversant. Not depressed or anxious appearing.  Calm demeanor.    Assessment and Plan: ***  Signed Lamar Blinks, MD

## 2018-10-02 ENCOUNTER — Telehealth: Payer: Self-pay | Admitting: Pulmonary Disease

## 2018-10-02 NOTE — Telephone Encounter (Signed)
Pt returned call. Appt made with TP in [place] on 10/04/2018. Pt verbalized understanding and denied any further questions or concerns at this time.

## 2018-10-02 NOTE — Telephone Encounter (Signed)
Called and explained that they patient would need to be seen for further refills. She is a [facility] patient and due to Dr.Sood being out she should be scheduled with TP in [place]. She will call back to schedule this.

## 2018-10-03 ENCOUNTER — Ambulatory Visit: Payer: Self-pay | Admitting: Family Medicine

## 2018-10-04 ENCOUNTER — Encounter: Payer: Self-pay | Admitting: Adult Health

## 2018-10-04 ENCOUNTER — Ambulatory Visit (INDEPENDENT_AMBULATORY_CARE_PROVIDER_SITE_OTHER): Payer: PPO | Admitting: Adult Health

## 2018-10-04 DIAGNOSIS — J449 Chronic obstructive pulmonary disease, unspecified: Secondary | ICD-10-CM | POA: Diagnosis not present

## 2018-10-04 NOTE — Assessment & Plan Note (Signed)
Appears stable   Plan  Patient Instructions  Continue on Symbicort 2 puffs Twice daily, rinse after use.  May use  Ipratropium /Albuterol Neb every 6hr as needed.  May use ProAir  Inhaler 2 puffs every 4hrs as needed for wheezing -this is your rescue medication.  Follow up Dr. Halford Chessman in 3-4 months at [facility] office and As needed  .  Please contact office for sooner follow up if symptoms do not improve or worsen or seek emergency care      n

## 2018-10-04 NOTE — Progress Notes (Signed)
@Patient  ID: Jenna Marquez, female    DOB: November 11, 1945, 73 y.o.   MRN: [id number]  Chief Complaint  Patient presents with  . Follow-up    COPD     Referring provider: Copland, Gay Filler, MD  HPI: 73yo female with COPD -GOLD 3 , former smoker .   Significant tests/ events 09/2010 FeV1 49% Fef 25 75 22%  Ct chest 07/2012 - no nodules/copd changes  CTA Chest 08/2015 , neg PE, COPD changes  Spirometry 7/2017shows FEV1 46%, , ratio 35 , FVC 102%, mid flows 20%. PVX (2012) and Prevnar (2016) utd.     10/04/2018 Follow up : COPD , O2 RF  Patient returns for a 4 month follow up .  Patient has severe COPD.  She remains on Symbicort twice daily.  And uses DuoNeb nebulizer as needed.  She says overall breathing is doing about the same.  She gets short of breath with minimum activity.  She denies any flare of cough or wheezing. Patient was previously on oxygen for a brief amount of time last year however has had no further desaturations and oxygen was discontinued Chest x-ray September 2019 showed COPD changes Influenza and Pneumovax/Prevnar are up-to-date  Says she has been having trouble with dermatitis.  She is following with dermatology and is currently on methotrexate  .    Allergies  Allergen Reactions  . Avelox [Moxifloxacin Hcl In Nacl] Itching and Rash    Immunization History  Administered Date(s) Administered  . Influenza Split 07/14/2011, 07/24/2012, 04/29/2013  . Influenza, High Dose Seasonal PF 05/07/2018  . Influenza,inj,Quad PF,6+ Mos 08/28/2014, 09/22/2015, 10/02/2016, 09/07/2017  . Pneumococcal Conjugate-13 10/30/2014  . Pneumococcal Polysaccharide-23 07/14/2011  . Zoster 03/23/2011    Past Medical History:  Diagnosis Date  . Allergic rhinitis   . Anxiety    "occasionally"  . Asthma   . COPD (chronic obstructive pulmonary disease) ([place])   . GERD (gastroesophageal reflux disease)   . Mobitz type 2 second degree AV block 01/06/2015    Tobacco  History: Social History   Tobacco Use  Smoking Status Former Smoker  . Packs/day: 1.00  . Years: 35.00  . Pack years: 35.00  . Types: Cigarettes  . Last attempt to quit: 08/10/2012  . Years since quitting: 6.1  Smokeless Tobacco Never Used  Tobacco Comment   Started at age 33   Counseling given: Not Answered Comment: Started at age 32   Outpatient Medications Prior to Visit  Medication Sig Dispense Refill  . acyclovir ointment (ZOVIRAX) 5 % Apply 1 application topically 3 (three) times daily. 30 g 0  . albuterol (PROAIR HFA) 108 (90 Base) MCG/ACT inhaler Inhale 1-2 puffs into the lungs every 6 (six) hours as needed for wheezing or shortness of breath. 1 Inhaler 3  . albuterol (PROVENTIL) (2.5 MG/3ML) 0.083% nebulizer solution Take 3 mLs (2.5 mg total) by nebulization every 6 (six) hours as needed for wheezing or shortness of breath. 75 mL 12  . albuterol (PROVENTIL) (2.5 MG/3ML) 0.083% nebulizer solution INHALE CONTENTS OF 1 VIAL VIA NEBULIZER 4 TIMES DAILY 375 mL 2  . ALPRAZolam (XANAX) 0.5 MG tablet Take 1 tablet (0.5 mg total) by mouth every 8 (eight) hours as needed for anxiety (**DO NOT TAKE AT NIGHT WITH HYDROXYZINE**). Do not take with alcohol 15 tablet 0  . aspirin EC 81 MG EC tablet Take 1 tablet (81 mg total) by mouth daily. 30 tablet 0  . budesonide-formoterol (SYMBICORT) 160-4.5 MCG/ACT inhaler Inhale 2 puffs into  the lungs 2 (two) times daily. 3 Inhaler 1  . calcium-vitamin D (OSCAL WITH D) 250-125 MG-UNIT tablet Take 1 tablet by mouth daily.    . Cholecalciferol (VITAMIN D) 2000 UNITS CAPS Take 1 capsule by mouth daily.    Marland Kitchen dicyclomine (BENTYL) 20 MG tablet Take 1 tablet (20 mg total) by mouth every 8 (eight) hours as needed for spasms (Abdominal cramping). 15 tablet 0  . fluocinonide cream (LIDEX) 2.77 % Apply 1 application topically 2 (two) times daily.    Marland Kitchen gabapentin (NEURONTIN) 100 MG capsule TAKE 1 CAPSULE BY MOUTH TWICE A DAY 180 capsule 1  . hydrOXYzine  (ATARAX/VISTARIL) 10 MG tablet TAKE 2 TABLETS BY MOUTH AS NEEDED FOR ITCHING. DO NOT TAKE WITH ALPRAZOLAM 180 tablet 2  . ibuprofen (ADVIL,MOTRIN) 800 MG tablet Take 1 tablet (800 mg total) by mouth 3 (three) times daily. Use only on occasion for pain 30 tablet 0  . ipratropium (ATROVENT) 0.02 % nebulizer solution TAKE 2.5 MLS BY NEBULIZATION EVERY 6 HOURS AS NEEDED FOR WHEEZING OR SHORTNESS OF BREATH. 62.5 mL 0  . methotrexate (RHEUMATREX) 2.5 MG tablet Take 15 mg by mouth once a week.   0  . ondansetron (ZOFRAN ODT) 4 MG disintegrating tablet Take 1 tablet (4 mg total) by mouth every 6 (six) hours as needed for nausea or vomiting. 20 tablet 0  . triamcinolone ointment (KENALOG) 0.1 % Apply 1 application topically 2 (two) times daily.   0  . metoprolol tartrate (LOPRESSOR) 25 MG tablet Take 1 tablet (25 mg total) by mouth 2 (two) times daily. (Patient not taking: Reported on 08/16/2018) 180 tablet 2   No facility-administered medications prior to visit.      Review of Systems:   Constitutional:   No  weight loss, night sweats,  Fevers, chills,  +fatigue, or  lassitude.  HEENT:   No headaches,  Difficulty swallowing,  Tooth/dental problems, or  Sore throat,                No sneezing, itching, ear ache, nasal congestion, post nasal drip,   CV:  No chest pain,  Orthopnea, PND, swelling in lower extremities, anasarca, dizziness, palpitations, syncope.   GI  No heartburn, indigestion, abdominal pain, nausea, vomiting, diarrhea, change in bowel habits, loss of appetite, bloody stools.   Resp:  .  No chest wall deformity  Skin: no rash or lesions.  GU: no dysuria, change in color of urine, no urgency or frequency.  No flank pain, no hematuria   MS:  No joint pain or swelling.  No decreased range of motion.  No back pain.    Physical Exam  BP 112/70 (BP Location: Left Arm, Cuff Size: Normal)   Pulse 94   Ht 5\' 8"  (1.727 m)   Wt 136 lb (61.7 kg)   SpO2 98%   BMI 20.68 kg/m   GEN:  A/Ox3; pleasant , NAD, thin and elderly    HEENT:  [place]/AT,  EACs-clear, TMs-wnl, NOSE-clear, THROAT-clear, no lesions, no postnasal drip or exudate noted.   NECK:  Supple w/ fair ROM; no JVD; normal carotid impulses w/o bruits; no thyromegaly or nodules palpated; no lymphadenopathy.    RESP  Decreased BS in bases   no accessory muscle use, no dullness to percussion  CARD:  RRR, no m/r/g, no peripheral edema, pulses intact, no cyanosis or clubbing.  GI:   Soft & nt; nml bowel sounds; no organomegaly or masses detected.   Musco: Warm bil, no deformities  or joint swelling noted.   Neuro: alert, no focal deficits noted.    Skin: Warm, no lesions or rashes    Lab Results:  CBC  BMET   BNP  Imaging: No results found.    No flowsheet data found.  No results found for: NITRICOXIDE      Assessment & Plan:   COPD (chronic obstructive pulmonary disease) gold stage C. Appears stable   Plan  Patient Instructions  Continue on Symbicort 2 puffs Twice daily, rinse after use.  May use  Ipratropium /Albuterol Neb every 6hr as needed.  May use ProAir  Inhaler 2 puffs every 4hrs as needed for wheezing -this is your rescue medication.  Follow up Dr. Halford Chessman in 3-4 months at [facility] office and As needed  .  Please contact office for sooner follow up if symptoms do not improve or worsen or seek emergency care      n      Rexene Edison, NP 10/04/2018

## 2018-10-04 NOTE — Patient Instructions (Addendum)
Continue on Symbicort 2 puffs Twice daily, rinse after use.  May use  Ipratropium /Albuterol Neb every 6hr as needed.  May use ProAir  Inhaler 2 puffs every 4hrs as needed for wheezing -this is your rescue medication.  Follow up Dr. Halford Chessman in 3-4 months at [facility] office and As needed  .  Please contact office for sooner follow up if symptoms do not improve or worsen or seek emergency care

## 2018-10-07 NOTE — Progress Notes (Deleted)
[place] at [facility] [address], [place], [place] [address] [phone]  Date:  10/11/2018   Name:  Jenna Marquez   DOB:  [birth year]   MRN:  [id number]  PCP:  Darreld Mclean, MD    Chief Complaint: No chief complaint on file.   History of Present Illness:  Jenna Marquez is a 73 y.o. very pleasant female patient who presents with the following:  Here today for periodic follow-up visit History of atrial fib and AV block status post pacemaker, hypertension, peripheral vascular disease, COPD, chronic kidney disease stage III, anxiety, alcohol abuse, dermatitis on methotrexate per dermatology I last saw her in October, when she recently had suffered from an episode of shingles on her scalp In the interim she was seen in the ER on January 2 with viral gastroenteritis Also seen by her pulmonologist earlier this month; noted to have severe COPD, using Symbicort twice daily and DuoNeb as needed  She is not currently on anticoagulation It looks like she last saw cardiology about a year ago Metoprolol  CBC and C met done in ER last month, reviewed in chart Renal function is stable  Colon cancer screening: Tetanus vaccine: She had a CT of her chest last August, no evidence of lung cancer Patient Active Problem List   Diagnosis Date Noted  . Chronic respiratory failure with hypoxia ([place]) 04/24/2018  . MDD (major depressive disorder), single episode, severe , no psychosis ([place])   . Hemoptysis 02/19/2018  . Community acquired pneumonia 02/19/2018  . Vagina bleeding 02/19/2018  . Alcohol abuse 02/19/2018  . Left knee pain 07/11/2017  . PVD (peripheral vascular disease) ([place]) 04/04/2017  . HTN (hypertension) 11/24/2016  . Paroxysmal atrial fibrillation ([place]) 09/30/2016  . Mobitz type 2 second degree AV block 09/30/2016  . Swelling of arm 05/22/2015  . Left shoulder pain 05/22/2015  . CKD (chronic kidney disease) stage 3,  GFR 30-59 ml/min (HCC) 05/11/2015  . Pacemaker 04/22/2015  . Generalized anxiety disorder 01/11/2012  . 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   . Tobacco abuse     Past Medical History:  Diagnosis Date  . Allergic rhinitis   . Anxiety    "occasionally"  . Asthma   . COPD (chronic obstructive pulmonary disease) ([place])   . GERD (gastroesophageal reflux disease)   . Mobitz type 2 second degree AV block 01/06/2015    Past Surgical History:  Procedure Laterality Date  . [place]  . EP IMPLANTABLE DEVICE N/A 01/07/2015   Procedure: Pacemaker Implant;  Surgeon: Evans Lance, MD;  Location: [place] [facility];  Service: Cardiovascular;  Laterality: N/A;  . PACEMAKER INSERTION    . TONSILLECTOMY AND ADENOIDECTOMY  1954    Social History   Tobacco Use  . Smoking status: Former Smoker    Packs/day: 1.00    Years: 35.00    Pack years: 35.00    Types: Cigarettes    Last attempt to quit: 08/10/2012    Years since quitting: 6.1  . Smokeless tobacco: Never Used  . Tobacco comment: Started at age 58  Substance Use Topics  . Alcohol use: Yes    Alcohol/week: 8.0 standard drinks    Types: 8 Glasses of wine per week  . Drug use: No    Family History  Problem Relation Age of Onset  . Coronary artery disease Mother   . Heart disease Mother  CHF  . Diabetes Neg Hx   . Cancer Neg Hx   . Stroke Neg Hx   . Colon cancer Neg Hx     Allergies  Allergen Reactions  . Avelox [Moxifloxacin Hcl In Nacl] Itching and Rash    Medication list has been reviewed and updated.  Current Outpatient Medications on File Prior to Visit  Medication Sig Dispense Refill  . acyclovir ointment (ZOVIRAX) 5 % Apply 1 application topically 3 (three) times daily. 30 g 0  . albuterol (PROAIR HFA) 108 (90 Base) MCG/ACT inhaler Inhale 1-2 puffs into the lungs every 6 (six) hours as needed for wheezing or shortness of breath. 1 Inhaler 3  . albuterol  (PROVENTIL) (2.5 MG/3ML) 0.083% nebulizer solution Take 3 mLs (2.5 mg total) by nebulization every 6 (six) hours as needed for wheezing or shortness of breath. 75 mL 12  . albuterol (PROVENTIL) (2.5 MG/3ML) 0.083% nebulizer solution INHALE CONTENTS OF 1 VIAL VIA NEBULIZER 4 TIMES DAILY 375 mL 2  . ALPRAZolam (XANAX) 0.5 MG tablet Take 1 tablet (0.5 mg total) by mouth every 8 (eight) hours as needed for anxiety (**DO NOT TAKE AT NIGHT WITH HYDROXYZINE**). Do not take with alcohol 15 tablet 0  . aspirin EC 81 MG EC tablet Take 1 tablet (81 mg total) by mouth daily. 30 tablet 0  . budesonide-formoterol (SYMBICORT) 160-4.5 MCG/ACT inhaler Inhale 2 puffs into the lungs 2 (two) times daily. 3 Inhaler 1  . calcium-vitamin D (OSCAL WITH D) 250-125 MG-UNIT tablet Take 1 tablet by mouth daily.    . Cholecalciferol (VITAMIN D) 2000 UNITS CAPS Take 1 capsule by mouth daily.    Marland Kitchen dicyclomine (BENTYL) 20 MG tablet Take 1 tablet (20 mg total) by mouth every 8 (eight) hours as needed for spasms (Abdominal cramping). 15 tablet 0  . fluocinonide cream (LIDEX) 8.47 % Apply 1 application topically 2 (two) times daily.    Marland Kitchen gabapentin (NEURONTIN) 100 MG capsule TAKE 1 CAPSULE BY MOUTH TWICE A DAY 180 capsule 1  . hydrOXYzine (ATARAX/VISTARIL) 10 MG tablet TAKE 2 TABLETS BY MOUTH AS NEEDED FOR ITCHING. DO NOT TAKE WITH ALPRAZOLAM 180 tablet 2  . ibuprofen (ADVIL,MOTRIN) 800 MG tablet Take 1 tablet (800 mg total) by mouth 3 (three) times daily. Use only on occasion for pain 30 tablet 0  . ipratropium (ATROVENT) 0.02 % nebulizer solution TAKE 2.5 MLS BY NEBULIZATION EVERY 6 HOURS AS NEEDED FOR WHEEZING OR SHORTNESS OF BREATH. 62.5 mL 0  . methotrexate (RHEUMATREX) 2.5 MG tablet Take 15 mg by mouth once a week.   0  . metoprolol tartrate (LOPRESSOR) 25 MG tablet Take 1 tablet (25 mg total) by mouth 2 (two) times daily. (Patient not taking: Reported on 08/16/2018) 180 tablet 2  . ondansetron (ZOFRAN ODT) 4 MG disintegrating  tablet Take 1 tablet (4 mg total) by mouth every 6 (six) hours as needed for nausea or vomiting. 20 tablet 0  . triamcinolone ointment (KENALOG) 0.1 % Apply 1 application topically 2 (two) times daily.   0   No current facility-administered medications on file prior to visit.     Review of Systems:  As per HPI- otherwise negative.   Physical Examination: There were no vitals filed for this visit. There were no vitals filed for this visit. There is no height or weight on file to calculate BMI. Ideal Body Weight:    GEN: WDWN, NAD, Non-toxic, A & O x 3 HEENT: Atraumatic, Normocephalic. Neck supple. No masses, No LAD.  Ears and Nose: No external deformity. CV: RRR, No M/G/R. No JVD. No thrill. No extra heart sounds. PULM: CTA B, no wheezes, crackles, rhonchi. No retractions. No resp. distress. No accessory muscle use. ABD: S, NT, ND, +BS. No rebound. No HSM. EXTR: No c/c/e NEURO Normal gait.  PSYCH: Normally interactive. Conversant. Not depressed or anxious appearing.  Calm demeanor.    Assessment and Plan: ***  Signed Lamar Blinks, MD

## 2018-10-08 NOTE — Progress Notes (Signed)
Reviewed and agree with assessment/plan.   Draco Malczewski, MD [place] 08/10/2016, 12:24 PM Pager:  [phone]  

## 2018-10-11 ENCOUNTER — Ambulatory Visit: Payer: Self-pay | Admitting: Family Medicine

## 2018-10-12 NOTE — Progress Notes (Signed)
[facility] [address] [place], [place] [address] [phone]  Date:  10/15/2018   Name:  Jenna Marquez   DOB:  Dec 25, 1945   MRN:  [id number]  PCP:  Darreld Mclean, MD    Chief Complaint: COPD (4 month follow up); Hypertension; and Colonoscopy (needs colonoscopy scheduled another location)   History of Present Illness:  Jenna Marquez is a 73 y.o. very pleasant female patient who presents with the following:  Periodic follow-up visit today I last saw her in July for hospital discharge follow-up-she had been admitted with pneumonia and hemoptysis History of COPD, PVD, a fib/AV block with pacer, HTN, anxiety CKD, alcohol abuse  Today reports that she is "not drinking much, maybe some Moscato wine" Seen by pulmonology recently for her COPD- GOLD 3  She is on symbicort BID and also duoneb prn Her breathing is about the same- she is often worse in the am  She will ned a neb first thing in the morning before she even gets out of bed  Colonoscopy is now due- she would like to have a referral  Tetanus: recent skin tear on her arm, will boost today  She is seeing podiatry for her feet, she notes issues with calluses and her nails   Flu, pneumonia UTD Shingrix- suggest that she have this done at pharmacy  Ask about cardiology care; she sees Dr. Curt Bears- she is overdue for a visit No CP- she does have some SOB but this is not new and is attributed to her COPD  Symbicort Albuterol Xanax as needed Aspirin 81 Gabapentin Methotrexate Metoprolol 25 BID--noted patient is tachycardic today.  It turns out she is not taking metoprolol, apparently she ran out at some point a few months ago.  She is not really sure when she stopped taking them  Wt Readings from Last 3 Encounters:  10/15/18 133 lb (60.3 kg)  10/04/18 136 lb (61.7 kg)  08/16/18 129 lb (58.5 kg)     Patient Active Problem List   Diagnosis Date Noted   . Chronic respiratory failure with hypoxia ([place]) 04/24/2018  . MDD (major depressive disorder), single episode, severe , no psychosis ([place])   . Hemoptysis 02/19/2018  . Community acquired pneumonia 02/19/2018  . Vagina bleeding 02/19/2018  . Alcohol abuse 02/19/2018  . Left knee pain 07/11/2017  . PVD (peripheral vascular disease) ([place]) 04/04/2017  . HTN (hypertension) 11/24/2016  . Paroxysmal atrial fibrillation ([place]) 09/30/2016  . Mobitz type 2 second degree AV block 09/30/2016  . Swelling of arm 05/22/2015  . Left shoulder pain 05/22/2015  . CKD (chronic kidney disease) stage 3, GFR 30-59 ml/min (H[facility]) 05/11/2015  . Pacemaker 04/22/2015  . Generalized anxiety disorder 01/11/2012  . 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   . Tobacco abuse     Past Medical History:  Diagnosis Date  . Allergic rhinitis   . Anxiety    "occasionally"  . Asthma   . COPD (chronic obstructive pulmonary disease) ([place])   . GERD (gastroesophageal reflux disease)   . Mobitz type 2 second degree AV block 01/06/2015    Past Surgical History:  Procedure Laterality Date  . [place]  . EP IMPLANTABLE DEVICE N/A 01/07/2015   Procedure: Pacemaker Implant;  Surgeon: Evans Lance, MD;  Location: [facility];  Service: Cardiovascular;  Laterality: N/A;  . PACEMAKER INSERTION    . TONSILLECTOMY  AND ADENOIDECTOMY  1954    Social History   Tobacco Use  . Smoking status: Former Smoker    Packs/day: 1.00    Years: 35.00    Pack years: 35.00    Types: Cigarettes    Last attempt to quit: 08/10/2012    Years since quitting: 6.1  . Smokeless tobacco: Never Used  . Tobacco comment: Started at age 74  Substance Use Topics  . Alcohol use: Yes    Alcohol/week: 8.0 standard drinks    Types: 8 Glasses of wine per week  . Drug use: No    Family History  Problem Relation Age of Onset  . Coronary artery disease Mother   . Heart disease Mother        CHF  .  Diabetes Neg Hx   . Cancer Neg Hx   . Stroke Neg Hx   . Colon cancer Neg Hx     Allergies  Allergen Reactions  . Avelox [Moxifloxacin Hcl In Nacl] Itching and Rash    Medication list has been reviewed and updated.  Current Outpatient Medications on File Prior to Visit  Medication Sig Dispense Refill  . acyclovir ointment (ZOVIRAX) 5 % Apply 1 application topically 3 (three) times daily. 30 g 0  . albuterol (PROAIR HFA) 108 (90 Base) MCG/ACT inhaler Inhale 1-2 puffs into the lungs every 6 (six) hours as needed for wheezing or shortness of breath. 1 Inhaler 3  . albuterol (PROVENTIL) (2.5 MG/3ML) 0.083% nebulizer solution Take 3 mLs (2.5 mg total) by nebulization every 6 (six) hours as needed for wheezing or shortness of breath. 75 mL 12  . albuterol (PROVENTIL) (2.5 MG/3ML) 0.083% nebulizer solution INHALE CONTENTS OF 1 VIAL VIA NEBULIZER 4 TIMES DAILY 375 mL 2  . ALPRAZolam (XANAX) 0.5 MG tablet Take 1 tablet (0.5 mg total) by mouth every 8 (eight) hours as needed for anxiety (**DO NOT TAKE AT NIGHT WITH HYDROXYZINE**). Do not take with alcohol 15 tablet 0  . aspirin EC 81 MG EC tablet Take 1 tablet (81 mg total) by mouth daily. 30 tablet 0  . budesonide-formoterol (SYMBICORT) 160-4.5 MCG/ACT inhaler Inhale 2 puffs into the lungs 2 (two) times daily. 3 Inhaler 1  . calcium-vitamin D (OSCAL WITH D) 250-125 MG-UNIT tablet Take 1 tablet by mouth daily.    . Cholecalciferol (VITAMIN D) 2000 UNITS CAPS Take 1 capsule by mouth daily.    Marland Kitchen dicyclomine (BENTYL) 20 MG tablet Take 1 tablet (20 mg total) by mouth every 8 (eight) hours as needed for spasms (Abdominal cramping). 15 tablet 0  . fluocinonide cream (LIDEX) 0.94 % Apply 1 application topically 2 (two) times daily.    Marland Kitchen gabapentin (NEURONTIN) 100 MG capsule TAKE 1 CAPSULE BY MOUTH TWICE A DAY 180 capsule 1  . hydrOXYzine (ATARAX/VISTARIL) 10 MG tablet TAKE 2 TABLETS BY MOUTH AS NEEDED FOR ITCHING. DO NOT TAKE WITH ALPRAZOLAM 180 tablet 2   . ibuprofen (ADVIL,MOTRIN) 800 MG tablet Take 1 tablet (800 mg total) by mouth 3 (three) times daily. Use only on occasion for pain 30 tablet 0  . ipratropium (ATROVENT) 0.02 % nebulizer solution TAKE 2.5 MLS BY NEBULIZATION EVERY 6 HOURS AS NEEDED FOR WHEEZING OR SHORTNESS OF BREATH. 62.5 mL 0  . methotrexate (RHEUMATREX) 2.5 MG tablet Take 15 mg by mouth once a week.   0  . triamcinolone ointment (KENALOG) 0.1 % Apply 1 application topically 2 (two) times daily.   0  . metoprolol tartrate (LOPRESSOR) 25 MG tablet  Take 1 tablet (25 mg total) by mouth 2 (two) times daily. (Patient not taking: Reported on 08/16/2018) 180 tablet 2   No current facility-administered medications on file prior to visit.     Review of Systems:  As per HPI- otherwise negative. No chest pain, no fever chills Physical Examination: Vitals:   10/15/18 0928  BP: 120/82  Pulse: (!) 108  Resp: 19  Temp: 97.7 F (36.5 C)  SpO2: 96%   Vitals:   10/15/18 0928  Weight: 133 lb (60.3 kg)  Height: 5\' 8"  (1.727 m)   Body mass index is 20.22 kg/m. Ideal Body Weight: Weight in (lb) to have BMI = 25: 164.1  GEN: WDWN, NAD, Non-toxic, A & O x 3, normal weight, appears her normal self today  HEENT: Atraumatic, Normocephalic. Neck supple. No masses, No LAD. Ears and Nose: No external deformity. CV: RRR, tachycardic, No M/G/R. No JVD. No thrill. No extra heart sounds. PULM: CTA B, no wheezes, crackles, rhonchi. No retractions. No resp. distress. No accessory muscle use. ABD: S, NT, ND EXTR: No c/c/e NEURO Normal gait for pt- very slow, not using a cane or walker PSYCH: Normally interactive. Conversant. Not depressed or anxious appearing.  Calm demeanor.  Skin tear on her left forearm -healing, does not need sutures  EKG: sinus tach with what looks like a new left bundle, due to her being now in a fully paced rhythm  Pulse Readings from Last 3 Encounters:  10/15/18 (!) 108  10/04/18 94  08/16/18 97   Wt Readings  from Last 3 Encounters:  10/15/18 133 lb (60.3 kg)  10/04/18 136 lb (61.7 kg)  08/16/18 129 lb (58.5 kg)    Assessment and Plan: Tachycardia - Plan: EKG 12-Lead, TSH, metoprolol tartrate (LOPRESSOR) 25 MG tablet  Screening for colon cancer - Plan: Ambulatory referral to Gastroenterology  Immunization due - Plan: Td vaccine greater than or equal to 7yo preservative free IM  Open wound of left forearm, initial encounter - Plan: Td vaccine greater than or equal to 7yo preservative free IM  Shortness of breath - Plan: B Nat Peptide, Basic metabolic panel, CBC  Tachycardia with paced rhythm.  Patient stopped taking her metoprolol.  I have restarted this, advised her to see her cardiologist ASAP She is not on anticoagulation, having declined to take it in the past Referral to GI for her screening colonoscopy Updated tetanus Shortness of breath is likely due to COPD.  Will check BMP, CBC, BNP  Signed Lamar Blinks, MD  Received her labs, message to patient-- BNP does not suggest heart failure Thyroid in range Kidneys okay Blood counts okay except for macrocytosis, likely due to alcohol use Results for orders placed or performed in visit on 10/15/18  B Nat Peptide  Result Value Ref Range   Pro B Natriuretic peptide (BNP) 150.0 (H) 0.0 - 100.0 pg/mL  TSH  Result Value Ref Range   TSH 2.88 0.35 - 4.50 uIU/mL  Basic metabolic panel  Result Value Ref Range   Sodium 139 135 - 145 mEq/L   Potassium 4.0 3.5 - 5.1 mEq/L   Chloride 101 96 - 112 mEq/L   CO2 26 19 - 32 mEq/L   Glucose, Bld 94 70 - 99 mg/dL   BUN 13 6 - 23 mg/dL   Creatinine, Ser 0.97 0.40 - 1.20 mg/dL   Calcium 9.2 8.4 - 10.5 mg/dL   GFR 67.74 >60.00 mL/min  CBC  Result Value Ref Range   WBC 5.5 4.0 -  10.5 K/uL   RBC 3.93 3.87 - 5.11 Mil/uL   Platelets 227.0 150.0 - 400.0 K/uL   Hemoglobin 14.5 12.0 - 15.0 g/dL   HCT 42.8 36.0 - 46.0 %   MCV 108.9 (H) 78.0 - 100.0 fl   MCHC 34.0 30.0 - 36.0 g/dL   RDW 15.0  11.5 - 15.5 %

## 2018-10-15 ENCOUNTER — Encounter: Payer: Self-pay | Admitting: Family Medicine

## 2018-10-15 ENCOUNTER — Ambulatory Visit (INDEPENDENT_AMBULATORY_CARE_PROVIDER_SITE_OTHER): Payer: PPO | Admitting: Family Medicine

## 2018-10-15 ENCOUNTER — Telehealth: Payer: Self-pay | Admitting: Cardiology

## 2018-10-15 ENCOUNTER — Encounter: Payer: Self-pay | Admitting: Internal Medicine

## 2018-10-15 VITALS — BP 120/82 | HR 108 | Temp 97.7°F | Resp 19 | Ht 68.0 in | Wt 133.0 lb

## 2018-10-15 DIAGNOSIS — Z23 Encounter for immunization: Secondary | ICD-10-CM | POA: Diagnosis not present

## 2018-10-15 DIAGNOSIS — R0602 Shortness of breath: Secondary | ICD-10-CM

## 2018-10-15 DIAGNOSIS — S51802A Unspecified open wound of left forearm, initial encounter: Secondary | ICD-10-CM

## 2018-10-15 DIAGNOSIS — R000 Tachycardia, unspecified: Secondary | ICD-10-CM

## 2018-10-15 DIAGNOSIS — Z1211 Encounter for screening for malignant neoplasm of colon: Secondary | ICD-10-CM | POA: Diagnosis not present

## 2018-10-15 LAB — CBC
HCT: 42.8 % (ref 36.0–46.0)
Hemoglobin: 14.5 g/dL (ref 12.0–15.0)
MCHC: 34 g/dL (ref 30.0–36.0)
MCV: 108.9 fl — ABNORMAL HIGH (ref 78.0–100.0)
Platelets: 227 10*3/uL (ref 150.0–400.0)
RBC: 3.93 Mil/uL (ref 3.87–5.11)
RDW: 15 % (ref 11.5–15.5)
WBC: 5.5 10*3/uL (ref 4.0–10.5)

## 2018-10-15 LAB — BRAIN NATRIURETIC PEPTIDE: Pro B Natriuretic peptide (BNP): 150 pg/mL — ABNORMAL HIGH (ref 0.0–100.0)

## 2018-10-15 LAB — BASIC METABOLIC PANEL
BUN: 13 mg/dL (ref 6–23)
CO2: 26 mEq/L (ref 19–32)
Calcium: 9.2 mg/dL (ref 8.4–10.5)
Chloride: 101 mEq/L (ref 96–112)
Creatinine, Ser: 0.97 mg/dL (ref 0.40–1.20)
GFR: 67.74 mL/min (ref >60.00)
Glucose, Bld: 94 mg/dL (ref 70–99)
Potassium: 4 mEq/L (ref 3.5–5.1)
Sodium: 139 mEq/L (ref 135–145)

## 2018-10-15 LAB — TSH: TSH: 2.88 u[IU]/mL (ref 0.35–4.50)

## 2018-10-15 MED ORDER — METOPROLOL TARTRATE 25 MG PO TABS: 25.0000 mg | ORAL_TABLET | Freq: Two times a day (BID) | ORAL | 2 refills | Status: DC

## 2018-10-15 NOTE — Patient Instructions (Addendum)
Good to see you today!  You got a tetanus booster today I would also recommend that you have the shingles vaccine- shingrix- at your pharmacy at your convenience  I am going to set you up to [facility] to discuss a colonoscopy I will check with your cardiologist about any adjustments that need to be made to your medications today - will call you!

## 2018-10-15 NOTE — Telephone Encounter (Signed)
A user error has taken place: ERROR °

## 2018-10-15 NOTE — Telephone Encounter (Signed)
Dr. Edilia Bo talked with Dr. Johnsie Cancel, DOD. Per Dr. Johnsie Cancel, patient needs to follow up with Dr. Curt Bears next available. Will send to scheduler and his nurse.

## 2018-10-15 NOTE — Telephone Encounter (Signed)
° °  Dr Edilia Bo calling for DOD

## 2018-10-16 NOTE — Telephone Encounter (Signed)
Melissa, EP scheduler, will reach out to patient and arrange OV w/ [facility]

## 2018-10-18 ENCOUNTER — Other Ambulatory Visit: Payer: Self-pay | Admitting: Family Medicine

## 2018-10-18 NOTE — Telephone Encounter (Signed)
Copied from [place] [phone]. Topic: Quick Communication - Rx Refill/Question >> Oct 18, 2018  4:24 PM Gustavus Messing wrote: Medication: ALPRAZolam Duanne Moron) 0.5 MG tablet   Has the patient contacted their pharmacy? No. (Agent: If no, request that the patient contact the pharmacy for the refill.) Patient was just in to see Dr. And told her she did not need them anymore but then the next day she needed some and they have expired. Wants a refill.   Preferred Pharmacy (with phone number or street name): [id number] [place], [place] [phone] (Phone) [phone] (Fax)    Agent: Please be advised that RX refills may take up to 3 business days. We ask that you follow-up with your pharmacy.

## 2018-10-19 MED ORDER — ALPRAZOLAM 0.5 MG PO TABS: 0.5000 mg | ORAL_TABLET | Freq: Three times a day (TID) | ORAL | 0 refills | Status: DC | PRN

## 2018-10-19 NOTE — Telephone Encounter (Signed)
Pt seen just recently in the office   07/04/2017  1   07/04/2017  Alprazolam 0.5 MG Tablet  15.00 5 Ed Sag   [id number]   [place] ([id number])   0  3.00 LME  Medicare   [place]  01/20/2017  1   01/20/2017  Alprazolam 0.5 MG Tablet  30.00 30 Je Cop   [id number]   [place] ([id number])   0  1.00 LME  Private Pay   [place]   Ok to refill

## 2018-10-21 ENCOUNTER — Other Ambulatory Visit: Payer: Self-pay | Admitting: Adult Health

## 2018-10-22 ENCOUNTER — Telehealth: Payer: Self-pay | Admitting: Adult Health

## 2018-10-22 ENCOUNTER — Encounter: Payer: Self-pay | Admitting: *Deleted

## 2018-10-22 NOTE — Telephone Encounter (Signed)
Called and spoke with pt who was requesting refill of ipratropium neb sol. Verified pt's preferred pharmacy and stated to pt that I would send refill in for her.  Med refill has been sent in for pt. Nothing further needed.

## 2018-10-30 ENCOUNTER — Encounter: Payer: PPO | Admitting: Cardiology

## 2018-10-30 ENCOUNTER — Telehealth: Payer: Self-pay | Admitting: Cardiology

## 2018-10-30 NOTE — Telephone Encounter (Signed)
Patient called needing help trouble shooting her monitor. When she tries to send the transmission the screen will go black. Instructed pt to call tech support. Pt verbalized understanding.

## 2018-11-21 DIAGNOSIS — M79672 Pain in left foot: Secondary | ICD-10-CM | POA: Diagnosis not present

## 2018-11-21 DIAGNOSIS — M79671 Pain in right foot: Secondary | ICD-10-CM | POA: Diagnosis not present

## 2018-11-21 DIAGNOSIS — Z23 Encounter for immunization: Secondary | ICD-10-CM | POA: Diagnosis not present

## 2018-11-21 DIAGNOSIS — L84 Corns and callosities: Secondary | ICD-10-CM | POA: Diagnosis not present

## 2018-11-21 DIAGNOSIS — B351 Tinea unguium: Secondary | ICD-10-CM | POA: Diagnosis not present

## 2018-11-26 ENCOUNTER — Ambulatory Visit: Payer: Self-pay | Admitting: *Deleted

## 2018-11-26 NOTE — Telephone Encounter (Signed)
Message from Ivar Drape sent at 11/26/2018 3:27 PM EDT   Patient has been experiencing a little blood in her bowels the last 3 bowel times. She would like to know what to take for constipation.    Having to strain to have a bowel movement. Had a bm today that was normal. She had m m's peanuts and  ate 3 small bags, and she thinks that caused constipation.  When wiping she noticed a smear of blood. Hx of colonoscopy showed polyps 2. Had last bm on 'Wednesday and again today. Denies constipation, bloating, distention or pain. Advised on what she needs to try for constipation. increasing her fluids, high fiber diet and trying over the counter mild laxatives. Advised that she speak with her pharmacist regarding what other meds otc that she can try.  Would like for her pcp to give her a call back regarding what else she can do for constipation.

## 2018-11-27 NOTE — Telephone Encounter (Signed)
Please advise 

## 2018-11-27 NOTE — Telephone Encounter (Signed)
Called her back- she notes that "I guess I was constipated" and she had to strain to have a BM.  She noted a little blood on her TP over the last week.  Not every time- a few times.  She had a little pain as well with the BM so probably a small tear  She plans to do a colonoscopy as soon as is feasible.  She is planning to see Dr. Cristina Gong with [facility] which is great.  She denies any belly pain or vomiting I suggested that she try a colace type stool softener prn If she has any more rectal bleeding please let us know Otherwise I think a routine colonoscopy when feasible is a wise idea

## 2018-12-06 ENCOUNTER — Telehealth: Payer: Self-pay

## 2018-12-06 NOTE — Telephone Encounter (Signed)
Copied from [place] [id number]. Topic: General - Inquiry >> Dec 06, 2018 10:13 AM Jenna Marquez wrote: Reason for CRM: Pt called and stated that the [phone] called her at 3:30pm on 3.22.20 and asked if she was interested in having assistance with meals and medicine getting delivered call [phone]. when Pt called she spoke with someone named Jenna Marquez and that person told the Pt that she only had her P[address]. Box and the Pt gave her the Home address. Pt is nervous and thinks this may have been a scam. / do you know about any programs and if this phone call is legit. They told the Pt that the company that was doing the delivery was going to be either [employer] or [employer]. Please reach out to Pt if anything is found out or not

## 2018-12-06 NOTE — Telephone Encounter (Signed)
Copied from [place] [id number]. Topic: General - Other >> Dec 05, 2018  5:01 PM Antonieta Iba C wrote: Reason for CRM: pt called in concerned because she said that she received a call from [phone] stating that they were with [facility] and is calling to assist pt with her medicine and food. Pt says that she is feeling skeptical about this call and would like to have assistance with looking into if it was a call from [facility].

## 2018-12-06 NOTE — Telephone Encounter (Signed)
Called patient to tell her answering machine came on when I call the phone number she gave. The person answering the phone advised to leave a message and phone number and did not identify who they were when they picked up phone on answering machine. Patient states they told her it was from [facility]. Advised I would check with [name] to see if she needed anything from patient.

## 2018-12-07 NOTE — Telephone Encounter (Signed)
Received all from [place] with [facility] and was explained that they reached out to the pt.   Curt Bears emailed me the information about there assistance program.   I called pt back and explained to pt, pt said that she would like to take advantage.  Emailed Curt Bears w/ [facility] to make her aware to call pt to assist further. Advised pt, she is grateful.

## 2018-12-07 NOTE — Telephone Encounter (Signed)
Received all from [place] with [facility] and was explained that they reached out to the pt.   Curt Bears emailed me the information about there assistance program.   I called pt back and explained to pt, pt said that she would like to take advantage.  Emailed Curt Bears w/ [facility] to make her aware to call pt to assist further. Advised pt, she is grateful.

## 2018-12-18 ENCOUNTER — Ambulatory Visit (INDEPENDENT_AMBULATORY_CARE_PROVIDER_SITE_OTHER): Payer: PPO | Admitting: Family

## 2018-12-18 ENCOUNTER — Telehealth: Payer: Self-pay | Admitting: Family Medicine

## 2018-12-18 ENCOUNTER — Telehealth: Payer: Self-pay | Admitting: Family

## 2018-12-18 ENCOUNTER — Other Ambulatory Visit: Payer: Self-pay

## 2018-12-18 ENCOUNTER — Other Ambulatory Visit: Payer: Self-pay | Admitting: Family Medicine

## 2018-12-18 ENCOUNTER — Ambulatory Visit (HOSPITAL_BASED_OUTPATIENT_CLINIC_OR_DEPARTMENT_OTHER)
Admission: RE | Admit: 2018-12-18 | Discharge: 2018-12-18 | Disposition: A | Discharge status: Home / Self Care | Payer: PPO | Source: Ambulatory Visit | Attending: Family | Admitting: Family

## 2018-12-18 DIAGNOSIS — M79672 Pain in left foot: Secondary | ICD-10-CM

## 2018-12-18 DIAGNOSIS — S99922A Unspecified injury of left foot, initial encounter: Secondary | ICD-10-CM | POA: Diagnosis not present

## 2018-12-18 MED ORDER — TRAMADOL HCL 50 MG PO TABS: ORAL_TABLET | ORAL | 0 refills | Status: DC

## 2018-12-18 MED ORDER — COLCHICINE 0.6 MG PO TABS: ORAL_TABLET | ORAL | 0 refills | Status: DC

## 2018-12-18 MED ORDER — PREDNISONE 10 MG PO TABS: ORAL_TABLET | ORAL | 0 refills | Status: AC

## 2018-12-18 NOTE — Telephone Encounter (Signed)
Copied from [place] [id number]. Topic: Quick Communication - Rx Refill/Question >> Dec 18, 2018  1:57 PM Reyne Dumas L wrote: Medication:  Pain medication  Pt called in and states that pain medication Rxd to her went to pharmacy with no instructions so they will not fill it.  Pt states she needs this medication today and needs to speak with someone. Pt can be reached at [phone]

## 2018-12-18 NOTE — Telephone Encounter (Signed)
Reviewed x-ray ( no fracture). She complains that "its just inflammed." I see that she had an elevated uric acid level about a year ago >9.  I suspect gout. Initially I sent over an rx for colchicine but after speaking with pt she declined. "I dont' have gout, just inflammation."  She requested rx for prednisone which I will send. I cancelled the rx for colchicine and gave instructions to the pharmacy for her tramadol 1 tab PO TID PRN #[id number].  She is advised to call if symptoms worsen or if they fail to improve. Pt verbalizes understanding.

## 2018-12-18 NOTE — Telephone Encounter (Signed)
Rx was sent by my partner look Debbrah Alar.  She is already taking care of this

## 2018-12-18 NOTE — Progress Notes (Signed)
Virtual Visit via Video Note  I connected with Jenna Marquez on 12/18/18 at 11:20 AM EDT by a video enabled telemedicine application and verified that I am speaking with the correct person using two identifiers. This visit type was conducted due to national recommendations for restrictions regarding the COVID-19 Pandemic (e.g. social distancing).  This format is felt to be most appropriate for this patient at this time.   I discussed the limitations of evaluation and management by telemedicine and the availability of in person appointments. The patient expressed understanding and agreed to proceed.  Only the patient and myself were on today's video visit. The patient was at home and I was in my office at the time of today's visit.   History of Present Illness:  Patient is a 73 yr old female who presents today following an injury to her left foot 1 week ago. She reports that she struck her foot on the base of her bed.  She reports that she has struck her foot several times "in the same place." Reports pain "up under my foot beneath my toes." reports that the are is swollen and read.  Has only been able to wear her sandles due to the pain. Notes that pain is throbbing and she has been using ibuprofen 800mg  prn pain.  This only helping her pain a little.   She is also requesting a refill on her xanax.    Observations/Objective:    Gen: Awake, alert, no acute distress Resp: Breathing is even and non-labored Psych: calm/pleasant demeanor Neuro: Alert and Oriented x 3, + facial symmetry, speech is clear. Ext: +mild erythema at the base of the left toes. Mild swelling noted of toes.  (unable to see the top of her foot due to difficulty positioning camera on her desktop.  Assessment and Plan:  Left foot pain-New.  Due to the severity of her pain I have recommended that she complete an x-ray of her foot today at the [place] [facility] to rule out fracture. . She has an N95 mask that she plans to wear.   For pain, I advised her to d/c ibuprofen 800 (she does have some previously elevated creatinines) and instead to use tylenol prn mild-moderate pain and a short course of tramadol prn severe pain. She is instructed not to take xanax or drink alcohol with tramadol due to increased risk of sedation. She verbalizes understanding.  A refill of her xanax was faxed along with tramadol to her pharmacy. Our electronic prescribing of controlled substances is not currently working.   Follow Up Instructions:    I discussed the assessment and treatment plan with the patient. The patient was provided an opportunity to ask questions and all were answered. The patient agreed with the plan and demonstrated an understanding of the instructions.   The patient was advised to call back or seek an in-person evaluation if the symptoms worsen or if the condition fails to improve as anticipated.    Jenna Pear, NP

## 2018-12-19 ENCOUNTER — Telehealth: Payer: Self-pay | Admitting: *Deleted

## 2018-12-19 NOTE — Telephone Encounter (Signed)

## 2018-12-20 ENCOUNTER — Telehealth: Payer: PPO | Admitting: Cardiology

## 2018-12-20 ENCOUNTER — Other Ambulatory Visit: Payer: Self-pay

## 2018-12-20 ENCOUNTER — Telehealth: Payer: Self-pay | Admitting: Cardiology

## 2018-12-20 NOTE — Telephone Encounter (Signed)
Spoke w/ pt and she stated that she is having issues w/ her home monitor. I attempted to help pt trouble shoot the monitor. Error code 7517 and [id number]. I instructed pt that I would call her back around 11 to see if we can send the transmission at that time that the monitor needed to charge.

## 2018-12-20 NOTE — Telephone Encounter (Signed)
Pt informed that we can't see her via virtual visit because she does not do remote checks and last check was 09/2017. Pt agreeable Pt understands I will discuss w/ device clinic about in clinic visit w/ [facility] on 6/9. Pt aware I will call her w/i next week/two

## 2018-12-20 NOTE — Telephone Encounter (Signed)
Pt scheduled for in office visit 6/9 when [facility] is DOD. Pt agreeable to visit

## 2018-12-20 NOTE — Telephone Encounter (Signed)
Follow Up;    Pt said she missed your call a few minutes ago.

## 2018-12-21 NOTE — Telephone Encounter (Signed)
Spoke w/ pt and attempted to help her send remote transmission. Error code 7793. Instructed pt to call tech support b/c she may need a new monitor.

## 2018-12-23 ENCOUNTER — Emergency Department (HOSPITAL_COMMUNITY): Payer: PPO

## 2018-12-23 ENCOUNTER — Other Ambulatory Visit: Payer: Self-pay

## 2018-12-23 ENCOUNTER — Inpatient Hospital Stay (HOSPITAL_COMMUNITY)
Admission: EM | Admit: 2018-12-23 | Discharge: 2019-01-02 | DRG: 480 | Disposition: A | Discharge status: Skilled Nursing Facility | Payer: PPO | Attending: Internal Medicine | Admitting: Internal Medicine

## 2018-12-23 ENCOUNTER — Encounter (HOSPITAL_COMMUNITY): Payer: Self-pay

## 2018-12-23 ENCOUNTER — Inpatient Hospital Stay (HOSPITAL_COMMUNITY): Payer: PPO

## 2018-12-23 DIAGNOSIS — Z720 Tobacco use: Secondary | ICD-10-CM

## 2018-12-23 DIAGNOSIS — I480 Paroxysmal atrial fibrillation: Secondary | ICD-10-CM | POA: Diagnosis present

## 2018-12-23 DIAGNOSIS — N183 Chronic kidney disease, stage 3 unspecified: Secondary | ICD-10-CM | POA: Diagnosis present

## 2018-12-23 DIAGNOSIS — R45851 Suicidal ideations: Secondary | ICD-10-CM | POA: Diagnosis not present

## 2018-12-23 DIAGNOSIS — M25461 Effusion, right knee: Secondary | ICD-10-CM | POA: Diagnosis not present

## 2018-12-23 DIAGNOSIS — R209 Unspecified disturbances of skin sensation: Secondary | ICD-10-CM | POA: Diagnosis not present

## 2018-12-23 DIAGNOSIS — F4321 Adjustment disorder with depressed mood: Secondary | ICD-10-CM | POA: Diagnosis not present

## 2018-12-23 DIAGNOSIS — M19011 Primary osteoarthritis, right shoulder: Secondary | ICD-10-CM | POA: Diagnosis not present

## 2018-12-23 DIAGNOSIS — S8991XA Unspecified injury of right lower leg, initial encounter: Secondary | ICD-10-CM | POA: Diagnosis not present

## 2018-12-23 DIAGNOSIS — I441 Atrioventricular block, second degree: Secondary | ICD-10-CM | POA: Diagnosis not present

## 2018-12-23 DIAGNOSIS — I130 Hypertensive heart and chronic kidney disease with heart failure and stage 1 through stage 4 chronic kidney disease, or unspecified chronic kidney disease: Secondary | ICD-10-CM | POA: Diagnosis present

## 2018-12-23 DIAGNOSIS — K219 Gastro-esophageal reflux disease without esophagitis: Secondary | ICD-10-CM | POA: Diagnosis not present

## 2018-12-23 DIAGNOSIS — Z791 Long term (current) use of non-steroidal anti-inflammatories (NSAID): Secondary | ICD-10-CM

## 2018-12-23 DIAGNOSIS — Z4789 Encounter for other orthopedic aftercare: Secondary | ICD-10-CM | POA: Diagnosis not present

## 2018-12-23 DIAGNOSIS — D62 Acute posthemorrhagic anemia: Secondary | ICD-10-CM | POA: Diagnosis not present

## 2018-12-23 DIAGNOSIS — Z7401 Bed confinement status: Secondary | ICD-10-CM | POA: Diagnosis not present

## 2018-12-23 DIAGNOSIS — I10 Essential (primary) hypertension: Secondary | ICD-10-CM | POA: Diagnosis present

## 2018-12-23 DIAGNOSIS — J810 Acute pulmonary edema: Secondary | ICD-10-CM | POA: Diagnosis not present

## 2018-12-23 DIAGNOSIS — R230 Cyanosis: Secondary | ICD-10-CM | POA: Diagnosis not present

## 2018-12-23 DIAGNOSIS — M25561 Pain in right knee: Secondary | ICD-10-CM | POA: Diagnosis not present

## 2018-12-23 DIAGNOSIS — J440 Chronic obstructive pulmonary disease with acute lower respiratory infection: Secondary | ICD-10-CM | POA: Diagnosis present

## 2018-12-23 DIAGNOSIS — Z419 Encounter for procedure for purposes other than remedying health state, unspecified: Secondary | ICD-10-CM

## 2018-12-23 DIAGNOSIS — J189 Pneumonia, unspecified organism: Secondary | ICD-10-CM | POA: Diagnosis not present

## 2018-12-23 DIAGNOSIS — M6281 Muscle weakness (generalized): Secondary | ICD-10-CM | POA: Diagnosis not present

## 2018-12-23 DIAGNOSIS — M2550 Pain in unspecified joint: Secondary | ICD-10-CM | POA: Diagnosis not present

## 2018-12-23 DIAGNOSIS — F411 Generalized anxiety disorder: Secondary | ICD-10-CM | POA: Diagnosis not present

## 2018-12-23 DIAGNOSIS — M79676 Pain in unspecified toe(s): Secondary | ICD-10-CM | POA: Diagnosis not present

## 2018-12-23 DIAGNOSIS — F329 Major depressive disorder, single episode, unspecified: Secondary | ICD-10-CM

## 2018-12-23 DIAGNOSIS — J9611 Chronic respiratory failure with hypoxia: Secondary | ICD-10-CM | POA: Diagnosis not present

## 2018-12-23 DIAGNOSIS — E876 Hypokalemia: Secondary | ICD-10-CM | POA: Diagnosis not present

## 2018-12-23 DIAGNOSIS — R0603 Acute respiratory distress: Secondary | ICD-10-CM | POA: Diagnosis not present

## 2018-12-23 DIAGNOSIS — M25551 Pain in right hip: Secondary | ICD-10-CM | POA: Diagnosis present

## 2018-12-23 DIAGNOSIS — Z79891 Long term (current) use of opiate analgesic: Secondary | ICD-10-CM

## 2018-12-23 DIAGNOSIS — Z8249 Family history of ischemic heart disease and other diseases of the circulatory system: Secondary | ICD-10-CM

## 2018-12-23 DIAGNOSIS — N182 Chronic kidney disease, stage 2 (mild): Secondary | ICD-10-CM | POA: Diagnosis not present

## 2018-12-23 DIAGNOSIS — R296 Repeated falls: Secondary | ICD-10-CM | POA: Diagnosis not present

## 2018-12-23 DIAGNOSIS — I959 Hypotension, unspecified: Secondary | ICD-10-CM | POA: Diagnosis not present

## 2018-12-23 DIAGNOSIS — R55 Syncope and collapse: Secondary | ICD-10-CM

## 2018-12-23 DIAGNOSIS — I5031 Acute diastolic (congestive) heart failure: Secondary | ICD-10-CM | POA: Diagnosis present

## 2018-12-23 DIAGNOSIS — Z1159 Encounter for screening for other viral diseases: Secondary | ICD-10-CM

## 2018-12-23 DIAGNOSIS — Z87891 Personal history of nicotine dependence: Secondary | ICD-10-CM

## 2018-12-23 DIAGNOSIS — S72001A Fracture of unspecified part of neck of right femur, initial encounter for closed fracture: Secondary | ICD-10-CM | POA: Diagnosis present

## 2018-12-23 DIAGNOSIS — J431 Panlobular emphysema: Secondary | ICD-10-CM

## 2018-12-23 DIAGNOSIS — F322 Major depressive disorder, single episode, severe without psychotic features: Secondary | ICD-10-CM

## 2018-12-23 DIAGNOSIS — M25521 Pain in right elbow: Secondary | ICD-10-CM | POA: Diagnosis not present

## 2018-12-23 DIAGNOSIS — N179 Acute kidney failure, unspecified: Secondary | ICD-10-CM | POA: Diagnosis not present

## 2018-12-23 DIAGNOSIS — Z7951 Long term (current) use of inhaled steroids: Secondary | ICD-10-CM

## 2018-12-23 DIAGNOSIS — M25519 Pain in unspecified shoulder: Secondary | ICD-10-CM

## 2018-12-23 DIAGNOSIS — R52 Pain, unspecified: Secondary | ICD-10-CM | POA: Diagnosis not present

## 2018-12-23 DIAGNOSIS — Z66 Do not resuscitate: Secondary | ICD-10-CM | POA: Diagnosis present

## 2018-12-23 DIAGNOSIS — S72141A Displaced intertrochanteric fracture of right femur, initial encounter for closed fracture: Secondary | ICD-10-CM | POA: Diagnosis not present

## 2018-12-23 DIAGNOSIS — I4510 Unspecified right bundle-branch block: Secondary | ICD-10-CM | POA: Diagnosis not present

## 2018-12-23 DIAGNOSIS — Z79899 Other long term (current) drug therapy: Secondary | ICD-10-CM

## 2018-12-23 DIAGNOSIS — S72001B Fracture of unspecified part of neck of right femur, initial encounter for open fracture type I or II: Secondary | ICD-10-CM | POA: Diagnosis present

## 2018-12-23 DIAGNOSIS — S299XXA Unspecified injury of thorax, initial encounter: Secondary | ICD-10-CM | POA: Diagnosis not present

## 2018-12-23 DIAGNOSIS — S7291XA Unspecified fracture of right femur, initial encounter for closed fracture: Secondary | ICD-10-CM | POA: Diagnosis not present

## 2018-12-23 DIAGNOSIS — D500 Iron deficiency anemia secondary to blood loss (chronic): Secondary | ICD-10-CM | POA: Diagnosis not present

## 2018-12-23 DIAGNOSIS — Z9981 Dependence on supplemental oxygen: Secondary | ICD-10-CM | POA: Diagnosis not present

## 2018-12-23 DIAGNOSIS — M25569 Pain in unspecified knee: Secondary | ICD-10-CM

## 2018-12-23 DIAGNOSIS — F32A Depression, unspecified: Secondary | ICD-10-CM

## 2018-12-23 DIAGNOSIS — F1010 Alcohol abuse, uncomplicated: Secondary | ICD-10-CM | POA: Diagnosis not present

## 2018-12-23 DIAGNOSIS — Z950 Presence of cardiac pacemaker: Secondary | ICD-10-CM | POA: Diagnosis present

## 2018-12-23 DIAGNOSIS — I739 Peripheral vascular disease, unspecified: Secondary | ICD-10-CM | POA: Diagnosis not present

## 2018-12-23 DIAGNOSIS — R0602 Shortness of breath: Secondary | ICD-10-CM

## 2018-12-23 DIAGNOSIS — R41841 Cognitive communication deficit: Secondary | ICD-10-CM | POA: Diagnosis not present

## 2018-12-23 DIAGNOSIS — R0600 Dyspnea, unspecified: Secondary | ICD-10-CM

## 2018-12-23 DIAGNOSIS — N178 Other acute kidney failure: Secondary | ICD-10-CM | POA: Diagnosis not present

## 2018-12-23 DIAGNOSIS — W19XXXA Unspecified fall, initial encounter: Secondary | ICD-10-CM | POA: Diagnosis present

## 2018-12-23 DIAGNOSIS — Z888 Allergy status to other drugs, medicaments and biological substances status: Secondary | ICD-10-CM

## 2018-12-23 DIAGNOSIS — Z7952 Long term (current) use of systemic steroids: Secondary | ICD-10-CM

## 2018-12-23 DIAGNOSIS — J449 Chronic obstructive pulmonary disease, unspecified: Secondary | ICD-10-CM | POA: Diagnosis not present

## 2018-12-23 DIAGNOSIS — S59901A Unspecified injury of right elbow, initial encounter: Secondary | ICD-10-CM | POA: Diagnosis not present

## 2018-12-23 DIAGNOSIS — S72141D Displaced intertrochanteric fracture of right femur, subsequent encounter for closed fracture with routine healing: Secondary | ICD-10-CM | POA: Diagnosis not present

## 2018-12-23 DIAGNOSIS — M79604 Pain in right leg: Secondary | ICD-10-CM | POA: Diagnosis not present

## 2018-12-23 DIAGNOSIS — Z03818 Encounter for observation for suspected exposure to other biological agents ruled out: Secondary | ICD-10-CM | POA: Diagnosis not present

## 2018-12-23 LAB — CBC WITH DIFFERENTIAL/PLATELET
Abs Immature Granulocytes: 0.06 10*3/uL (ref 0.00–0.07)
Basophils Absolute: 0.1 10*3/uL (ref 0.0–0.1)
Basophils Relative: 1 %
Eosinophils Absolute: 0.2 10*3/uL (ref 0.0–0.5)
Eosinophils Relative: 2 %
HCT: 37.3 % (ref 36.0–46.0)
Hemoglobin: 12.4 g/dL (ref 12.0–15.0)
Immature Granulocytes: 1 %
Lymphocytes Relative: 21 %
Lymphs Abs: 1.7 10*3/uL (ref 0.7–4.0)
MCH: 37.9 pg — ABNORMAL HIGH (ref 26.0–34.0)
MCHC: 33.2 g/dL (ref 30.0–36.0)
MCV: 114.1 fL — ABNORMAL HIGH (ref 80.0–100.0)
Monocytes Absolute: 1.4 10*3/uL — ABNORMAL HIGH (ref 0.1–1.0)
Monocytes Relative: 16 %
Neutro Abs: 4.9 10*3/uL (ref 1.7–7.7)
Neutrophils Relative %: 59 %
Platelets: 217 10*3/uL (ref 150–400)
RBC: 3.27 MIL/uL — ABNORMAL LOW (ref 3.87–5.11)
RDW: 13.2 % (ref 11.5–15.5)
WBC: 8.3 10*3/uL (ref 4.0–10.5)
nRBC: 0 % (ref 0.0–0.2)

## 2018-12-23 LAB — CBC
HCT: 33.7 % — ABNORMAL LOW (ref 36.0–46.0)
Hemoglobin: 11 g/dL — ABNORMAL LOW (ref 12.0–15.0)
MCH: 37.8 pg — ABNORMAL HIGH (ref 26.0–34.0)
MCHC: 32.6 g/dL (ref 30.0–36.0)
MCV: 115.8 fL — ABNORMAL HIGH (ref 80.0–100.0)
Platelets: 198 10*3/uL (ref 150–400)
RBC: 2.91 MIL/uL — ABNORMAL LOW (ref 3.87–5.11)
RDW: 13.2 % (ref 11.5–15.5)
WBC: 9.5 10*3/uL (ref 4.0–10.5)
nRBC: 0 % (ref 0.0–0.2)

## 2018-12-23 LAB — RAPID URINE DRUG SCREEN, HOSP PERFORMED
Amphetamines: NOT DETECTED
Barbiturates: NOT DETECTED
Benzodiazepines: POSITIVE — AB
Cocaine: NOT DETECTED
Opiates: POSITIVE — AB
Tetrahydrocannabinol: NOT DETECTED

## 2018-12-23 LAB — BASIC METABOLIC PANEL
Anion gap: 12 (ref 5–15)
BUN: 14 mg/dL (ref 8–23)
CO2: 26 mmol/L (ref 22–32)
Calcium: 8.7 mg/dL — ABNORMAL LOW (ref 8.9–10.3)
Chloride: 101 mmol/L (ref 98–111)
Creatinine, Ser: 1.1 mg/dL — ABNORMAL HIGH (ref 0.44–1.00)
GFR calc Af Amer: 57 mL/min — ABNORMAL LOW (ref >60)
GFR calc non Af Amer: 49 mL/min — ABNORMAL LOW (ref >60)
Glucose, Bld: 139 mg/dL — ABNORMAL HIGH (ref 70–99)
Potassium: 5.1 mmol/L (ref 3.5–5.1)
Sodium: 139 mmol/L (ref 135–145)

## 2018-12-23 LAB — PROTIME-INR
INR: 1 (ref 0.8–1.2)
Prothrombin Time: 13 seconds (ref 11.4–15.2)

## 2018-12-23 LAB — CREATININE, SERUM
Creatinine, Ser: 0.92 mg/dL (ref 0.44–1.00)
GFR calc Af Amer: >60 mL/min (ref >60)
GFR calc non Af Amer: >60 mL/min (ref >60)

## 2018-12-23 LAB — SARS CORONAVIRUS 2 BY RT PCR (HOSPITAL ORDER, PERFORMED IN ~~LOC~~ HOSPITAL LAB): SARS Coronavirus 2: NEGATIVE

## 2018-12-23 LAB — ETHANOL: Alcohol, Ethyl (B): <10 mg/dL (ref <10)

## 2018-12-23 LAB — ECHOCARDIOGRAM LIMITED

## 2018-12-23 MED ORDER — SODIUM CHLORIDE 0.9 % IV SOLN
INTRAVENOUS | Status: DC
  Administered 2018-12-24: 01:00:00 via INTRAVENOUS

## 2018-12-23 MED ORDER — ENSURE PRE-SURGERY PO LIQD
296.0000 mL | Freq: Once | ORAL | Status: AC
  Administered 2018-12-24: 296 mL via ORAL
  Filled 2018-12-23: qty 296

## 2018-12-23 MED ORDER — ALPRAZOLAM 0.5 MG PO TABS: 0.5000 mg | ORAL_TABLET | Freq: Three times a day (TID) | ORAL | Status: DC | PRN

## 2018-12-23 MED ORDER — ACETAMINOPHEN 650 MG RE SUPP: 650.0000 mg | Freq: Four times a day (QID) | RECTAL | Status: DC | PRN

## 2018-12-23 MED ORDER — MUPIROCIN 2 % EX OINT
1.0000 "application " | TOPICAL_OINTMENT | Freq: Two times a day (BID) | CUTANEOUS | Status: AC
  Administered 2018-12-23 – 2018-12-28 (×9): 1 via NASAL
  Filled 2018-12-23 (×2): qty 22

## 2018-12-23 MED ORDER — IPRATROPIUM BROMIDE 0.02 % IN SOLN
0.5000 mg | Freq: Four times a day (QID) | RESPIRATORY_TRACT | Status: DC | PRN
  Administered 2018-12-23 – 2019-01-02 (×2): 0.5 mg via RESPIRATORY_TRACT
  Filled 2018-12-23 (×2): qty 2.5

## 2018-12-23 MED ORDER — FENTANYL CITRATE (PF) 100 MCG/2ML IJ SOLN
50.0000 ug | INTRAMUSCULAR | Status: AC | PRN
  Administered 2018-12-23 (×2): 50 ug via INTRAVENOUS
  Filled 2018-12-23 (×2): qty 2

## 2018-12-23 MED ORDER — ONDANSETRON HCL 4 MG/2ML IJ SOLN
4.0000 mg | Freq: Four times a day (QID) | INTRAMUSCULAR | Status: DC | PRN
  Administered 2018-12-24: 14:00:00 4 mg via INTRAVENOUS

## 2018-12-23 MED ORDER — LORAZEPAM 2 MG/ML IJ SOLN: 1.0000 mg | Freq: Four times a day (QID) | INTRAMUSCULAR | Status: DC | PRN

## 2018-12-23 MED ORDER — HEPARIN SODIUM (PORCINE) 5000 UNIT/ML IJ SOLN
5000.0000 [IU] | Freq: Three times a day (TID) | INTRAMUSCULAR | Status: DC
  Administered 2018-12-23 – 2018-12-24 (×3): 5000 [IU] via SUBCUTANEOUS
  Filled 2018-12-23 (×3): qty 1

## 2018-12-23 MED ORDER — GUAIFENESIN-DM 100-10 MG/5ML PO SYRP
5.0000 mL | ORAL_SOLUTION | ORAL | Status: DC | PRN
  Administered 2018-12-23: 5 mL via ORAL
  Filled 2018-12-23: qty 10

## 2018-12-23 MED ORDER — ONDANSETRON HCL 4 MG/2ML IJ SOLN
4.0000 mg | Freq: Once | INTRAMUSCULAR | Status: AC
  Administered 2018-12-23: 08:00:00 4 mg via INTRAVENOUS
  Filled 2018-12-23: qty 2

## 2018-12-23 MED ORDER — THIAMINE HCL 100 MG/ML IJ SOLN: 100.0000 mg | Freq: Every day | INTRAMUSCULAR | Status: DC

## 2018-12-23 MED ORDER — ALBUTEROL SULFATE HFA 108 (90 BASE) MCG/ACT IN AERS: 1.0000 | INHALATION_SPRAY | Freq: Four times a day (QID) | RESPIRATORY_TRACT | Status: DC | PRN

## 2018-12-23 MED ORDER — THIAMINE HCL 100 MG/ML IJ SOLN
Freq: Once | INTRAVENOUS | Status: AC
  Administered 2018-12-23: 14:00:00 via INTRAVENOUS
  Filled 2018-12-23: qty 1000

## 2018-12-23 MED ORDER — PERFLUTREN LIPID MICROSPHERE
1.0000 mL | INTRAVENOUS | Status: AC | PRN
  Administered 2018-12-23: 3 mL via INTRAVENOUS
  Filled 2018-12-23: qty 10

## 2018-12-23 MED ORDER — ADULT MULTIVITAMIN W/MINERALS CH
1.0000 | ORAL_TABLET | Freq: Every day | ORAL | Status: DC
  Administered 2018-12-25 – 2019-01-01 (×8): 1 via ORAL
  Filled 2018-12-23 (×7): qty 1

## 2018-12-23 MED ORDER — SODIUM CHLORIDE 0.9 % IV SOLN
INTRAVENOUS | Status: DC
  Administered 2018-12-23: 08:00:00 via INTRAVENOUS
  Administered 2018-12-24: 1000 mL via INTRAVENOUS
  Administered 2018-12-25 – 2018-12-26 (×4): via INTRAVENOUS

## 2018-12-23 MED ORDER — MORPHINE SULFATE (PF) 4 MG/ML IV SOLN
4.0000 mg | Freq: Once | INTRAVENOUS | Status: AC
  Administered 2018-12-23: 4 mg via INTRAVENOUS
  Filled 2018-12-23: qty 1

## 2018-12-23 MED ORDER — METHOCARBAMOL 1000 MG/10ML IJ SOLN
500.0000 mg | Freq: Three times a day (TID) | INTRAVENOUS | Status: DC | PRN
  Administered 2018-12-24: 500 mg via INTRAVENOUS
  Filled 2018-12-23: qty 500
  Filled 2018-12-23: qty 5

## 2018-12-23 MED ORDER — LORAZEPAM 2 MG/ML IJ SOLN: 0.0000 mg | Freq: Four times a day (QID) | INTRAMUSCULAR | Status: DC

## 2018-12-23 MED ORDER — VITAMIN B-1 100 MG PO TABS: 100.0000 mg | ORAL_TABLET | Freq: Every day | ORAL | Status: DC

## 2018-12-23 MED ORDER — ONDANSETRON HCL 4 MG PO TABS: 4.0000 mg | ORAL_TABLET | Freq: Four times a day (QID) | ORAL | Status: DC | PRN

## 2018-12-23 MED ORDER — LORAZEPAM 2 MG/ML IJ SOLN
2.0000 mg | INTRAMUSCULAR | Status: DC | PRN
  Administered 2018-12-26 (×2): 2 mg via INTRAVENOUS
  Administered 2018-12-27: 3 mg via INTRAVENOUS
  Administered 2018-12-27 – 2018-12-29 (×4): 2 mg via INTRAVENOUS
  Filled 2018-12-23: qty 1
  Filled 2018-12-23: qty 2
  Filled 2018-12-23 (×5): qty 1

## 2018-12-23 MED ORDER — THIAMINE HCL 100 MG/ML IJ SOLN
100.0000 mg | Freq: Every day | INTRAMUSCULAR | Status: DC
  Administered 2018-12-23 – 2018-12-25 (×3): 100 mg via INTRAVENOUS
  Filled 2018-12-23 (×3): qty 2

## 2018-12-23 MED ORDER — DOCUSATE SODIUM 100 MG PO CAPS
100.0000 mg | ORAL_CAPSULE | Freq: Two times a day (BID) | ORAL | Status: DC
  Administered 2018-12-23 – 2019-01-02 (×19): 100 mg via ORAL
  Filled 2018-12-23 (×19): qty 1

## 2018-12-23 MED ORDER — LORAZEPAM 2 MG/ML IJ SOLN: 0.0000 mg | Freq: Two times a day (BID) | INTRAMUSCULAR | Status: DC

## 2018-12-23 MED ORDER — IPRATROPIUM-ALBUTEROL 0.5-2.5 (3) MG/3ML IN SOLN
3.0000 mL | Freq: Four times a day (QID) | RESPIRATORY_TRACT | Status: DC
  Administered 2018-12-23 – 2018-12-29 (×20): 3 mL via RESPIRATORY_TRACT
  Filled 2018-12-23 (×21): qty 3

## 2018-12-23 MED ORDER — FOLIC ACID 1 MG PO TABS: 1.0000 mg | ORAL_TABLET | Freq: Every day | ORAL | Status: DC

## 2018-12-23 MED ORDER — ALBUTEROL SULFATE (2.5 MG/3ML) 0.083% IN NEBU
2.5000 mg | INHALATION_SOLUTION | Freq: Four times a day (QID) | RESPIRATORY_TRACT | Status: DC | PRN
  Administered 2018-12-23: 2.5 mg via RESPIRATORY_TRACT
  Filled 2018-12-23: qty 3

## 2018-12-23 MED ORDER — MOMETASONE FURO-FORMOTEROL FUM 200-5 MCG/ACT IN AERO
2.0000 | INHALATION_SPRAY | Freq: Two times a day (BID) | RESPIRATORY_TRACT | Status: DC
  Administered 2018-12-23 – 2019-01-02 (×19): 2 via RESPIRATORY_TRACT
  Filled 2018-12-23: qty 8.8

## 2018-12-23 MED ORDER — METOPROLOL TARTRATE 25 MG PO TABS
25.0000 mg | ORAL_TABLET | Freq: Two times a day (BID) | ORAL | Status: DC
  Administered 2018-12-23 – 2019-01-02 (×17): 25 mg via ORAL
  Filled 2018-12-23 (×20): qty 1

## 2018-12-23 MED ORDER — OXYCODONE HCL 5 MG PO TABS
5.0000 mg | ORAL_TABLET | ORAL | Status: DC | PRN
  Administered 2018-12-23 – 2018-12-24 (×3): 5 mg via ORAL
  Filled 2018-12-23 (×3): qty 1

## 2018-12-23 MED ORDER — TRAZODONE HCL 50 MG PO TABS
25.0000 mg | ORAL_TABLET | Freq: Every evening | ORAL | Status: DC | PRN
  Administered 2018-12-28 – 2019-01-01 (×3): 25 mg via ORAL
  Filled 2018-12-23 (×4): qty 1

## 2018-12-23 MED ORDER — FOLIC ACID 5 MG/ML IJ SOLN
1.0000 mg | Freq: Every day | INTRAMUSCULAR | Status: DC
  Administered 2018-12-24: 1 mg via INTRAVENOUS
  Filled 2018-12-23 (×3): qty 0.2

## 2018-12-23 MED ORDER — LORAZEPAM 1 MG PO TABS: 1.0000 mg | ORAL_TABLET | Freq: Four times a day (QID) | ORAL | Status: DC | PRN

## 2018-12-23 MED ORDER — ACETAMINOPHEN 325 MG PO TABS
650.0000 mg | ORAL_TABLET | Freq: Four times a day (QID) | ORAL | Status: DC | PRN
  Administered 2018-12-23: 650 mg via ORAL
  Filled 2018-12-23: qty 2

## 2018-12-23 NOTE — ED Notes (Signed)
Received call from telemetry that patient is undersensing on pacemaker. Pacing spikes noted on cardiac monitor. rPatient asymptomatic denies chest pain or pressure. Notified Dr. Sherral Hammers no new orders at this time.  Advised to monitor for pacer spikes and notify if any changes noted.

## 2018-12-23 NOTE — Progress Notes (Signed)
  Echocardiogram 2D Echocardiogram with Definity has been performed.  Jenna Marquez 12/23/2018, 1:47 PM

## 2018-12-23 NOTE — ED Notes (Signed)
Bed: IL48 Expected date:  Expected time:  Means of arrival:  Comments: EMS 73 yo hip pain, fall

## 2018-12-23 NOTE — H&P (Signed)
[facility] History and Physical  Jenna Marquez YTK:[id number] DOB: 1945-08-26 DOA: 12/23/2018  Referring physician:  PCP: Darreld Mclean, MD   Chief Complaint: Right Hip Fracture  HPI: Jenna Marquez is a 73 y.o. 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 Patient states she was trying to transfer to a chair today when she fell landing on her right hip and arm. Patient has a 24-hour caregiver. The caregiver heard her fall and attempted to help her up. They were unable to get her to stand. Patient did scrape her elbow but her primary rib pain is in her right hip. Patient states she did hit her head but denies any loss of consciousness is not having a headache. She does not take any blood thinners. Patient states she has not been feeling sick recently. She denies any chest pain or shortness of breath. No vomiting or diarrhea. EMS was called and she was transported to the emergency room.  States her last drink was yesterday (cup of brandy).  States negative use of drugs.   Review of Systems:  Constitutional:  No weight loss, night sweats, Fevers, chills, fatigue.  HEENT:  No headaches, Difficulty swallowing, positive for dentation.  Negative Sore throat,  No sneezing, itching, ear ache, nasal congestion, post nasal drip,  Cardio-vascular:  No chest pain, Orthopnea, PND, swelling in lower extremities, anasarca, dizziness, palpitations  GI:  No heartburn, indigestion, abdominal pain, nausea, vomiting, diarrhea, change in bowel habits, loss of appetite  Resp:  No shortness of breath with exertion or at rest. No excess mucus, no productive cough, No non-productive cough, No coughing up of blood.No change in color of mucus.No wheezing.No chest wall  deformity  Skin:  no rash or lesions.  GU:  no dysuria, change in color of urine, no urgency or frequency. No flank pain.  Musculoskeletal:  No joint pain or swelling.  Positive RIGHT lower extremity decreased range of motion. No back pain.  Psych:  No change in mood or affect. No depression or anxiety. No memory loss.   Past Medical History:  Diagnosis Date   Abnormal mammogram 07/20/2011   Acute respiratory failure with hypoxia ([place]) 09/30/2016   Alcohol abuse 02/19/2018   ALLERGIC RHINITIS 10/13/2010   Qualifier: Diagnosis of  By: Ronnald Ramp CMA, [place]     Anxiety    "occasionally"   ASTHMA 10/13/2010   Qualifier: Diagnosis of  By: Ronnald Ramp CMA, [place]     Blunt trauma of nose 06/06/2012   Chest pain 08/18/2011   Chest pain on breathing 05/11/2015   Chronic respiratory failure with hypoxia ([place]) 04/24/2018   CKD (chronic kidney disease) stage 2, GFR 60-89 ml/min 09/30/2016   COPD exacerbation ([place]) 08/16/2013   E. 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   HTN (hypertension) 11/24/2016   Indigestion 04/23/2012   Left knee pain 07/11/2017   Left shoulder pain 05/22/2015   Lumbar radicular pain 06/13/2012   MDD (major depressive disorder), single episode, severe , no psychosis ([place])    Pacemaker 04/22/2015   Palpitations 08/16/2013   Paroxysmal atrial fibrillation ([place]) 09/30/2016  Pneumonia 02/19/2018   PVD (peripheral vascular disease) ([place]) 04/04/2017   RBBB (right bundle branch block) 07/31/2011   Swelling of arm 05/22/2015   Swollen uvula 11/14/2014   Tinnitus of both ears 10/23/2012   Tobacco abuse    QUIT 12/13    Vagina bleeding 02/19/2018   Vitamin D deficiency 01/30/2013   Past Surgical History:  Procedure Laterality Date   ECTOPIC PREGNANCY  SURGERY  1974   EP IMPLANTABLE DEVICE N/A 01/07/2015   Procedure: Pacemaker Implant;  Surgeon: Evans Lance, MD;  Location: [facility];  Service: Cardiovascular;  Laterality: N/A;   PACEMAKER INSERTION     TONSILLECTOMY AND ADENOIDECTOMY  1954   Social History:  reports that she quit smoking about 6 years ago. Her smoking use included cigarettes. She has a 35.00 pack-year smoking history. She has never used smokeless tobacco. She reports current alcohol use of about 8.0 standard drinks of alcohol per week. She reports that she does not use drugs.  Allergies  Allergen Reactions   Avelox [Moxifloxacin Hcl In Nacl] Itching and Rash    Family History  Problem Relation Age of Onset   Coronary artery disease Mother    Heart disease Mother        CHF   Congestive Heart Failure Mother    Diabetes Neg Hx    Cancer Neg Hx    Stroke Neg Hx    Colon cancer Neg Hx      Prior to Admission medications   Medication Sig Start Date End Date Taking? Authorizing Provider  acyclovir ointment (ZOVIRAX) 5 % Apply 1 application topically 3 (three) times daily. Patient taking differently: Apply 1 application topically daily as needed (for flares).  05/11/18  Yes Paz, Alda Berthold, MD  albuterol (PROAIR HFA) 108 (90 Base) MCG/ACT inhaler Inhale 1-2 puffs into the lungs every 6 (six) hours as needed for wheezing or shortness of breath. 12/21/17  Yes Parrett, Tammy S, NP  albuterol (PROVENTIL) (2.5 MG/3ML) 0.083% nebulizer solution Take 3 mLs (2.5 mg total) by nebulization every 6 (six) hours as needed for wheezing or shortness of breath. 04/24/18  Yes Parrett, Tammy S, NP  ALPRAZolam (XANAX) 0.5 MG tablet TAKE 1 TAB EVERY 8 HRS AS NEEDED FOR ANXIETY, DO NOT TAKE AT NIGHT WITH HYDROXYZINE OR WITH ALCOHOL Patient taking differently: Take 0.5 mg by mouth every 8 (eight) hours as needed for anxiety.  12/18/18  Yes Debbrah Alar, NP  budesonide-formoterol (SYMBICORT) 160-4.5 MCG/ACT inhaler Inhale 2  puffs into the lungs 2 (two) times daily. 10/27/17  Yes Parrett, Tammy S, NP  calcium-vitamin D (OSCAL WITH D) 250-125 MG-UNIT tablet Take 1 tablet by mouth daily.   Yes [provider]  Cholecalciferol (VITAMIN D) 2000 UNITS CAPS Take 1 capsule by mouth daily.   Yes [provider]  fluocinonide cream (LIDEX) 1.61 % Apply 1 application topically 2 (two) times daily.   Yes [provider]  ibuprofen (ADVIL,MOTRIN) 800 MG tablet Take 1 tablet (800 mg total) by mouth 3 (three) times daily. Use only on occasion for pain 05/23/18  Yes Copland, Jessica C, MD  ipratropium (ATROVENT) 0.02 % nebulizer solution INHALE 2.5ML BY NEBULIZATION EVERY 6 HOURS AS NEEDED FOR WHEEZING OR SHORTNESS OF BREATH. Patient taking differently: Take 0.5 mg by nebulization every 6 (six) hours as needed for wheezing or shortness of breath.  10/22/18  Yes Parrett, Fonnie Mu, NP  methotrexate (RHEUMATREX) 2.5 MG tablet Take 12.5 mg by mouth every Monday.  05/01/18  Yes [provider]  metoprolol tartrate (LOPRESSOR) 25 MG tablet Take 1 tablet (25 mg total) by mouth 2 (two) times daily. 10/15/18 01/13/19 Yes Copland, Gay Filler, MD  traMADol (ULTRAM) 50 MG tablet Do not take with xanax or alcohol Patient taking differently: Take 50 mg by mouth every 6 (six) hours as needed for moderate pain or severe pain. Do not take with xanax or alcohol 12/18/18  Yes Debbrah Alar, NP  dicyclomine (BENTYL) 20 MG tablet Take 1 tablet (20 mg total) by mouth every 8 (eight) hours as needed for spasms (Abdominal cramping). Patient not taking: Reported on 12/23/2018 08/16/18   Ward, Delice Bison, DO  gabapentin (NEURONTIN) 100 MG capsule TAKE 1 CAPSULE BY MOUTH TWICE A DAY Patient not taking: Reported on 12/23/2018 06/26/18   Copland, Gay Filler, MD  predniSONE (DELTASONE) 10 MG tablet 4 tabs by mouth once daily for 2 days, then 3 tabs daily x 2 days, then 2 tabs daily x 2 days, then 1 tab daily x 2 days 12/18/18   Debbrah Alar, NP     Consultants:  Dr. Percell Miller orthopedic surgery    Procedures/Significant Events:  5/10 DG hip unilateral with pelvis:Severely comminuted and angulated intertrochanteric fracture of proximal right femur. 5/10 DG RIGHT elbow: Negative for fracture 5/10 PCXR: Basilar atelectasis    I have personally reviewed and interpreted all radiology studies and my findings are as above.   VENTILATOR SETTINGS: None   Cultures None  Antimicrobials: None   Devices None   LINES / TUBES:  None    Continuous Infusions:  sodium chloride Stopped (12/23/18 0944)    Physical Exam: Vitals:   12/23/18 0830 12/23/18 0900 12/23/18 0930 12/23/18 0938  BP: 140/62 (!) 142/66 (!) 154/74 (!) 154/74  Pulse: 93 95  95  Resp:    (!) 22  Temp:      TempSrc:      SpO2: (!) 86% 94%  94%    Wt Readings from Last 3 Encounters:  10/15/18 60.3 kg  10/04/18 61.7 kg  08/16/18 58.5 kg    General: A/O x4, no acute respiratory distress Eyes: negative scleral hemorrhage, negative anisocoria, negative icterus ENT: Negative Runny nose, negative gingival bleeding, Neck:  Negative scars, masses, torticollis, lymphadenopathy, JVD Lungs: Clear to auscultation bilaterally.  Bilateral mild expiratory wheeze without crackles Cardiovascular: Regular rate and rhythm without murmur gallop or rub normal S1 and S2 Abdomen: negative abdominal pain, nondistended, positive soft, bowel sounds, no rebound, no ascites, no appreciable mass, pacer present left chest wall Extremities: No significant cyanosis, clubbing, RIGHT lower extremity deformity consistent with comminuted fracture, swelling painful to palpation  Skin: Negative rashes, lesions, ulcers Psychiatric:  Negative depression, positive anxiety, negative fatigue, negative mania  Central nervous system:  Cranial nerves II through XII intact, tongue/uvula midline, all extremities muscle strength 5/5, sensation intact throughout, negative  dysarthria, negative expressive aphasia, negative receptive aphasia.        Labs on Admission:  Basic Metabolic Panel: Recent Labs  Lab 12/23/18 0748  NA 139  K 5.1  CL 101  CO2 26  GLUCOSE 139*  BUN 14  CREATININE 1.10*  CALCIUM 8.7*   Liver Function Tests: No results for input(s): AST, ALT, ALKPHOS, BILITOT, PROT, ALBUMIN in the last 168 hours. No results for input(s): LIPASE, AMYLASE in the last 168 hours. No results for input(s): AMMONIA in the last 168 hours. CBC: Recent Labs  Lab 12/23/18 0748  WBC 8.3  NEUTROABS 4.9  HGB 12.4  HCT 37.3  MCV 114.1*  PLT 217   Cardiac Enzymes: No results for input(s): CKTOTAL, CKMB, CKMBINDEX, TROPONINI in the last 168 hours.  BNP (last 3 results) Recent Labs    02/18/18 2114  BNP 136.4*    ProBNP (last 3 results) Recent Labs    10/15/18 1017  PROBNP 150.0*    CBG: No results for input(s): GLUCAP in the last 168 hours.  Radiological Exams on Admission: Dg Elbow Complete Right  Result Date: 12/23/2018 CLINICAL DATA:  Right elbow pain after fall. EXAM: RIGHT ELBOW - COMPLETE 3+ VIEW COMPARISON:  None. FINDINGS: There is no evidence of fracture, dislocation, or joint effusion. There is no evidence of arthropathy or other focal bone abnormality. Soft tissues are unremarkable. IMPRESSION: Negative. Electronically Signed   By: Marijo Conception M.D.   On: 12/23/2018 09:26   Dg Chest Port 1 View  Result Date: 12/23/2018 CLINICAL DATA:  Fall, right hip fracture EXAM: PORTABLE CHEST 1 VIEW COMPARISON:  1919 FINDINGS: Left subclavian 2 lead pacer noted. Normal heart size and vascularity. Minor basilar atelectasis and slightly decreased lung volumes. No focal pneumonia, collapse or consolidation. Negative for edema, effusion or pneumothorax. Atherosclerosis noted. Trachea is midline. Bones are osteopenic. IMPRESSION: Basilar atelectasis. Atherosclerosis No acute chest process. Electronically Signed   By: Jerilynn Mages.  Shick M.D.   On:  12/23/2018 09:34   Dg Hip Unilat With Pelvis 2-3 Views Right  Result Date: 12/23/2018 CLINICAL DATA:  Right hip pain after fall today. EXAM: DG HIP (WITH OR WITHOUT PELVIS) 2-3V RIGHT COMPARISON:  None. FINDINGS: Severely comminuted and angulated fracture is seen involving the intertrochanteric region of the proximal right femur. Left hip appears normal. Vascular calcifications are noted. IMPRESSION: Severely comminuted and angulated intertrochanteric fracture of proximal right femur. Electronically Signed   By: Marijo Conception M.D.   On: 12/23/2018 09:25    EKG: Independently reviewed.  Spiked rhythm at 89 bpm, RBBB  Assessment/Plan Active Problems:   COPD (chronic obstructive pulmonary disease) gold stage C.   Tobacco abuse   Generalized anxiety disorder   Pacemaker   CKD (chronic kidney disease) stage 3, GFR 30-59 ml/min (HCC)   Paroxysmal atrial fibrillation (HCC)   Mobitz type 2 second degree AV block   HTN (hypertension)   Alcohol abuse   MDD (major depressive disorder), single episode, severe , no psychosis ([place])   Chronic respiratory failure with hypoxia (HCC)   RIGHT comminuted hip fracture - Dr. Tomi Bamberger from ED has consulted Dr. Percell Miller orthopedic surgery over the phone who stated will perform surgical correction on Monday or Tuesday. - Discussed placing patient in a traction device with Dr. Tomi Bamberger and Dr. Eulis Foster, decided would not place patient in traction and would allow orthopedic surgery to make decision. - IV in p.o. pain control medication.  Paroxysmal atrial fibrillation/Mobitz 2 second-degree AV block/Pacer - Currently NSR (paced) - Echocardiogram in preparation for surgery - BP slightly elevated most likely secondary to hip pain. - Metoprolol 25 mg twice daily (home med) -Not on anticoagulant  Essential HTN - See paroxysmal A. fib  COPD - Titrate O2 to maintain SPO2 89 to 93% - Albuterol nebs QID as needed - Ipratropium nebulizer QID as needed - Dulera 200-5  mcg 2 puffs twice daily  EtOH abuse - Patient admits to drinking brandy yesterday: States 1cup spoke with Dr. Valetta Close (daughter-in-law) and she and Mrs. Pinney son states drinks 1/5 brandy per day - Toxicology/rapid urine drug screen pending -CIWA protocol  Anxiety/depression -  Covered by  CIWA protocol  Goals of care - Spoke with Dr. Valetta Close (daughter-in-law) who has patient's living will states patient is DNR.  Also states patient may not be very receptive to SNF post surgery, although I did broach the subject with patient.     Code Status: DNR (DVT Prophylaxis: Subcu heparin Family Communication: None Disposition Plan: TBD   Data Reviewed: Care during the described time interval was provided by me .  I have reviewed this patient's available data, including medical history, events of note, physical examination, and all test results as part of my evaluation.   The patient is critically ill with multiple organ systems failure and requires high complexity decision making for assessment and support, frequent evaluation and titration of therapies, application of advanced monitoring technologies and extensive interpretation of multiple databases. Critical Care Time devoted to patient care services described in this note  Time spent: 93 minutes   Martita Brumm, [facility] Pager [phone]

## 2018-12-23 NOTE — Progress Notes (Addendum)
Attempted to have pt sign consent for surgery tomorrow. Pt refusing to sign consent at this time because she does not remember speaking with the doctor regarding her surgery. Unsigned consent placed at the front of pt chart. Spoke with PA on call who says that they will talk to pt in the morning about her surgery. Pt aware and verbalizes understanding.

## 2018-12-23 NOTE — ED Triage Notes (Signed)
EMS reports from home, Fall while transferring to chair, Pt c/o right leg and hip and arm pain. Pt denies LOC, no blood thinners, states she did hit head. Pt has pacemaker. EMS states Pt refused IV and reiterated does not ant an IV at arrival.  BP 170/90 HR 94 RR 18 Sp02 98 RA

## 2018-12-23 NOTE — Plan of Care (Signed)
  Problem: Education: Goal: Knowledge of General Education information will improve Description Including pain rating scale, medication(s)/side effects and non-pharmacologic comfort measures Outcome: Progressing   Problem: Clinical Measurements: Goal: Ability to maintain clinical measurements within normal limits will improve Outcome: Progressing Goal: Will remain free from infection Outcome: Progressing Goal: Respiratory complications will improve Outcome: Progressing Goal: Cardiovascular complication will be avoided Outcome: Progressing   Problem: Elimination: Goal: Will not experience complications related to urinary retention Outcome: Progressing   Problem: Pain Managment: Goal: General experience of comfort will improve Outcome: Progressing   Problem: Skin Integrity: Goal: Risk for impaired skin integrity will decrease Outcome: Progressing

## 2018-12-23 NOTE — ED Provider Notes (Signed)
[facility] DEPT Provider Note   CSN: [id number] Arrival date & time: 12/23/18  0732    History   Chief Complaint Chief Complaint  Patient presents with  . Hip Pain  . Leg Pain  . Arm Pain    HPI Jenna Marquez is a 73 y.o. female.     HPI Patient presents to the ED for evaluation of head pain.  Patient states she was trying to transfer to a chair today when she fell landing on her right hip and arm.  Patient has a 24-hour caregiver.  The caregiver heard her fall and attempted to help her up.  They were unable to get her to stand.  Patient did scrape her elbow but her primary rib pain is in her right hip.  Patient states she did hit her head but denies any loss of consciousness is not having a headache.  She does not take any blood thinners.  Patient states she has not been feeling sick recently.  She denies any chest pain or shortness of breath.  No vomiting or diarrhea.  EMS was called and she was transported to the emergency room. Past Medical History:  Diagnosis Date  . Abnormal mammogram 07/20/2011  . Acute respiratory failure with hypoxia ([place]) 09/30/2016  . Alcohol abuse 02/19/2018  . ALLERGIC RHINITIS 10/13/2010   Qualifier: Diagnosis of  By: Ronnald Ramp CMA, [place]    . Anxiety    "occasionally"  . ASTHMA 10/13/2010   Qualifier: Diagnosis of  By: Ronnald Ramp CMA, [place]    . Blunt trauma of nose 06/06/2012  . Chest pain 08/18/2011  . Chest pain on breathing 05/11/2015  . Chronic respiratory failure with hypoxia ([place]) 04/24/2018  . CKD (chronic kidney disease) stage 2, GFR 60-89 ml/min 09/30/2016  . COPD exacerbation ([place]) 08/16/2013  . E. coli UTI 02/22/2018  . Elevated BP 02/23/2012  . Generalized anxiety disorder 01/11/2012  . GERD (gastroesophageal reflux disease)   . Head injury 10/23/2012  . 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 Hemoptysis 02/19/2018  . 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 HTN (hypertension) 11/24/2016  . Indigestion 04/23/2012  .  Left knee pain 07/11/2017  . Left shoulder pain 05/22/2015  . Lumbar radicular pain 06/13/2012  . MDD (major depressive disorder), single episode, severe , no psychosis ([place])   . Pacemaker 04/22/2015  . Palpitations 08/16/2013  . Paroxysmal atrial fibrillation ([place]) 09/30/2016  . Pneumonia 02/19/2018  . PVD (peripheral vascular disease) ([place]) 04/04/2017  . RBBB (right bundle branch block) 07/31/2011  . Swelling of arm 05/22/2015  . Swollen uvula 11/14/2014  . Tinnitus of both ears 10/23/2012  . Tobacco abuse    QUIT 12/13   . Vagina bleeding 02/19/2018  . Vitamin D deficiency 01/30/2013    Patient Active Problem List   Diagnosis Date Noted  . Chronic respiratory failure with hypoxia ([place]) 04/24/2018  . MDD (major depressive disorder), single episode, severe , no psychosis ([place])   . Hemoptysis 02/19/2018  . Community acquired pneumonia 02/19/2018  . Vagina bleeding 02/19/2018  . Alcohol abuse 02/19/2018  . Left knee pain 07/11/2017  . PVD (peripheral vascular disease) ([place]) 04/04/2017  . HTN (hypertension) 11/24/2016  . Paroxysmal atrial fibrillation ([place]) 09/30/2016  . Mobitz type 2 second degree AV block 09/30/2016  . Swelling of arm 05/22/2015  . Left shoulder pain 05/22/2015  . CKD (chronic kidney disease) stage 3, GFR 30-59 ml/min ([facility]) 05/11/2015  . Pacemaker 04/22/2015  . Generalized anxiety disorder 01/11/2012  .  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   . Tobacco abuse     Past Surgical History:  Procedure Laterality Date  . [place]  . EP IMPLANTABLE DEVICE N/A 01/07/2015   Procedure: Pacemaker Implant;  Surgeon: Evans Lance, MD;  Location: [facility];  Service: Cardiovascular;  Laterality: N/A;  . PACEMAKER INSERTION    . TONSILLECTOMY AND ADENOIDECTOMY  1954     OB History    Gravida  1   Para  1   Term  1   Preterm      AB      Living        SAB      TAB      Ectopic      Multiple      Live Births   1            Home Medications    Prior to Admission medications   Medication Sig Start Date End Date Taking? Authorizing Provider  acyclovir ointment (ZOVIRAX) 5 % Apply 1 application topically 3 (three) times daily. Patient taking differently: Apply 1 application topically daily as needed (for flares).  05/11/18  Yes Paz, Alda Berthold, MD  albuterol (PROAIR HFA) 108 (90 Base) MCG/ACT inhaler Inhale 1-2 puffs into the lungs every 6 (six) hours as needed for wheezing or shortness of breath. 12/21/17  Yes Parrett, Tammy S, NP  albuterol (PROVENTIL) (2.5 MG/3ML) 0.083% nebulizer solution Take 3 mLs (2.5 mg total) by nebulization every 6 (six) hours as needed for wheezing or shortness of breath. 04/24/18  Yes Parrett, Tammy S, NP  ALPRAZolam (XANAX) 0.5 MG tablet TAKE 1 TAB EVERY 8 HRS AS NEEDED FOR ANXIETY, DO NOT TAKE AT NIGHT WITH HYDROXYZINE OR WITH ALCOHOL Patient taking differently: Take 0.5 mg by mouth every 8 (eight) hours as needed for anxiety.  12/18/18  Yes Debbrah Alar, NP  budesonide-formoterol (SYMBICORT) 160-4.5 MCG/ACT inhaler Inhale 2 puffs into the lungs 2 (two) times daily. 10/27/17  Yes Parrett, Tammy S, NP  calcium-vitamin D (OSCAL WITH D) 250-125 MG-UNIT tablet Take 1 tablet by mouth daily.   Yes [provider]  Cholecalciferol (VITAMIN D) 2000 UNITS CAPS Take 1 capsule by mouth daily.   Yes [provider]  fluocinonide cream (LIDEX) 8.11 % Apply 1 application topically 2 (two) times daily.   Yes [provider]  ibuprofen (ADVIL,MOTRIN) 800 MG tablet Take 1 tablet (800 mg total) by mouth 3 (three) times daily. Use only on occasion for pain 05/23/18  Yes Copland, Jessica C, MD  ipratropium (ATROVENT) 0.02 % nebulizer solution INHALE 2.5ML BY NEBULIZATION EVERY 6 HOURS AS NEEDED FOR WHEEZING OR SHORTNESS OF BREATH. Patient taking differently: Take 0.5 mg by nebulization every 6 (six) hours as needed for wheezing or shortness of breath.  10/22/18  Yes  Parrett, Fonnie Mu, NP  methotrexate (RHEUMATREX) 2.5 MG tablet Take 12.5 mg by mouth every Monday.  05/01/18  Yes [provider]  metoprolol tartrate (LOPRESSOR) 25 MG tablet Take 1 tablet (25 mg total) by mouth 2 (two) times daily. 10/15/18 01/13/19 Yes Copland, Gay Filler, MD  traMADol (ULTRAM) 50 MG tablet Do not take with xanax or alcohol Patient taking differently: Take 50 mg by mouth every 6 (six) hours as needed for moderate pain or severe pain. Do not take with xanax or alcohol 12/18/18  Yes Debbrah Alar, NP  dicyclomine (BENTYL) 20 MG tablet Take 1 tablet (20 mg total) by  mouth every 8 (eight) hours as needed for spasms (Abdominal cramping). Patient not taking: Reported on 12/23/2018 08/16/18   Ward, Delice Bison, DO  gabapentin (NEURONTIN) 100 MG capsule TAKE 1 CAPSULE BY MOUTH TWICE A DAY Patient not taking: Reported on 12/23/2018 06/26/18   Copland, Gay Filler, MD  predniSONE (DELTASONE) 10 MG tablet 4 tabs by mouth once daily for 2 days, then 3 tabs daily x 2 days, then 2 tabs daily x 2 days, then 1 tab daily x 2 days 12/18/18   Debbrah Alar, NP    Family History Family History  Problem Relation Age of Onset  . Coronary artery disease Mother   . Heart disease Mother        CHF  . Congestive Heart Failure Mother   . Diabetes Neg Hx   . Cancer Neg Hx   . Stroke Neg Hx   . Colon cancer Neg Hx     Social History Social History   Tobacco Use  . Smoking status: Former Smoker    Packs/day: 1.00    Years: 35.00    Pack years: 35.00    Types: Cigarettes    Last attempt to quit: 08/10/2012    Years since quitting: 6.3  . Smokeless tobacco: Never Used  . Tobacco comment: Started at age 38  Substance Use Topics  . Alcohol use: Yes    Alcohol/week: 8.0 standard drinks    Types: 8 Glasses of wine per week  . Drug use: No     Allergies   Avelox [moxifloxacin hcl in nacl]   Review of Systems Review of Systems  All other systems reviewed and are negative.     Physical Exam Updated Vital Signs BP (!) 154/74   Pulse 95   Temp (!) 97.4 F (36.3 C) (Oral)   Resp (!) 22   SpO2 94%   Physical Exam Vitals signs and nursing note reviewed.  Constitutional:      General: She is not in acute distress.    Appearance: She is well-developed.  HENT:     Head: Normocephalic and atraumatic.     Right Ear: External ear normal.     Left Ear: External ear normal.  Eyes:     General: No scleral icterus.       Right eye: No discharge.        Left eye: No discharge.     Conjunctiva/sclera: Conjunctivae normal.  Neck:     Musculoskeletal: Neck supple.     Trachea: No tracheal deviation.  Cardiovascular:     Rate and Rhythm: Normal rate and regular rhythm.  Pulmonary:     Effort: Pulmonary effort is normal. No respiratory distress.     Breath sounds: Normal breath sounds. No stridor. No wheezing or rales.  Abdominal:     General: Bowel sounds are normal. There is no distension.     Palpations: Abdomen is soft.     Tenderness: There is no abdominal tenderness. There is no guarding or rebound.  Musculoskeletal:     Right elbow: She exhibits normal range of motion, no swelling and no deformity. Tenderness found.     Right hip: She exhibits decreased range of motion, tenderness and bony tenderness.     Cervical back: Normal. She exhibits no tenderness.     Thoracic back: Normal. She exhibits no tenderness.     Lumbar back: Normal. She exhibits no tenderness.     Comments: ?shortening RLE; superficial skin tear right elbow,  Skin:  General: Skin is warm and dry.     Findings: No rash.  Neurological:     Mental Status: She is alert.     Cranial Nerves: No cranial nerve deficit (no facial droop, extraocular movements intact, no slurred speech).     Sensory: No sensory deficit.     Motor: No abnormal muscle tone or seizure activity.     Coordination: Coordination normal.      ED Treatments / Results  Labs (all labs ordered are listed, but only  abnormal results are displayed) Labs Reviewed  BASIC METABOLIC PANEL - Abnormal; Notable for the following components:      Result Value   Glucose, Bld 139 (*)    Creatinine, Ser 1.10 (*)    Calcium 8.7 (*)    GFR calc non Af Amer 49 (*)    GFR calc Af Amer 57 (*)    All other components within normal limits  CBC WITH DIFFERENTIAL/PLATELET - Abnormal; Notable for the following components:   RBC 3.27 (*)    MCV 114.1 (*)    MCH 37.9 (*)    Monocytes Absolute 1.4 (*)    All other components within normal limits  SARS CORONAVIRUS 2 (HOSPITAL ORDER, [facility])  PROTIME-INR  TYPE AND SCREEN    EKG EKG Interpretation  Date/Time:  Sunday Dec 23 2018 07:47:34 EDT Ventricular Rate:  89 PR Interval:    QRS Duration: 121 QT Interval:  389 QTC Calculation: 474 R Axis:   -13 Text Interpretation:  Pacemaker spikes or artifacts Sinus rhythm Right bundle branch block Baseline wander in lead(s) II III aVF t wave changes laterally compared to prior tracing Confirmed by Dorie Rank [phone]) on 12/23/2018 7:51:19 AM   Radiology Dg Elbow Complete Right  Result Date: 12/23/2018 CLINICAL DATA:  Right elbow pain after fall. EXAM: RIGHT ELBOW - COMPLETE 3+ VIEW COMPARISON:  None. FINDINGS: There is no evidence of fracture, dislocation, or joint effusion. There is no evidence of arthropathy or other focal bone abnormality. Soft tissues are unremarkable. IMPRESSION: Negative. Electronically Signed   By: Marijo Conception M.D.   On: 12/23/2018 09:26   Dg Chest Port 1 View  Result Date: 12/23/2018 CLINICAL DATA:  Fall, right hip fracture EXAM: PORTABLE CHEST 1 VIEW COMPARISON:  1919 FINDINGS: Left subclavian 2 lead pacer noted. Normal heart size and vascularity. Minor basilar atelectasis and slightly decreased lung volumes. No focal pneumonia, collapse or consolidation. Negative for edema, effusion or pneumothorax. Atherosclerosis noted. Trachea is midline. Bones are osteopenic.  IMPRESSION: Basilar atelectasis. Atherosclerosis No acute chest process. Electronically Signed   By: Jerilynn Mages.  Shick M.D.   On: 12/23/2018 09:34   Dg Hip Unilat With Pelvis 2-3 Views Right  Result Date: 12/23/2018 CLINICAL DATA:  Right hip pain after fall today. EXAM: DG HIP (WITH OR WITHOUT PELVIS) 2-3V RIGHT COMPARISON:  None. FINDINGS: Severely comminuted and angulated fracture is seen involving the intertrochanteric region of the proximal right femur. Left hip appears normal. Vascular calcifications are noted. IMPRESSION: Severely comminuted and angulated intertrochanteric fracture of proximal right femur. Electronically Signed   By: Marijo Conception M.D.   On: 12/23/2018 09:25    Procedures Procedures (including critical care time)  Medications Ordered in ED Medications  0.9 %  sodium chloride infusion ( Intravenous New Bag/Given 12/23/18 0814)  fentaNYL (SUBLIMAZE) injection 50 mcg (50 mcg Intravenous Given 12/23/18 0814)  morphine 4 MG/ML injection 4 mg (has no administration in time range)  ondansetron (  ZOFRAN) injection 4 mg (4 mg Intravenous Given 12/23/18 0814)     Initial Impression / Assessment and Plan / ED Course  I have reviewed the triage vital signs and the nursing notes.  Pertinent labs & imaging results that were available during my care of the patient were reviewed by me and considered in my medical decision making (see chart for details).  Clinical Course as of Dec 23 955  Sun Dec 23, 2018  0938 Notified pt's niece, Dr Valetta Close of the results   [JK]  [phone] D/w Dr Percell Miller.  Plan on OR Monday or Tuesday.   [JK]  [id number] Labs reviewed.  No clinically significant abnormalities.  Chest x-ray and elbow x-ray without significant abnormalities.  Hip fracture demonstrated on the right hip films   [JK]    Clinical Course User Index [JK] Dorie Rank, MD    Patient presented to the emergency room for evaluation after a fall.  Patient denied any syncope.  No significant head injury.  Exam  is notable for significant pain in her right hip.  Patient's x-rays confirm a right intratrochanteric hip fracture.  I have consulted with orthopedic surgery.  I will consult with the medical service for admission.  I have notified the patient's niece of her condition at the patient's request.   Final Clinical Impressions(s) / ED Diagnoses   Final diagnoses:  Closed displaced intertrochanteric fracture of right femur, initial encounter [facility])     Dorie Rank, MD 12/23/18 [phone]

## 2018-12-23 NOTE — ED Notes (Signed)
ED TO INPATIENT HANDOFF REPORT  ED Nurse Name and Phone #: Tana Coast Name/Age/Gender Jenna Marquez 73 y.o. female Room/Bed: WA10/WA10  Code Status   Code Status: DNR  Home/SNF/Other Home Patient oriented to: self, place, time and situation Is this baseline? Yes   Triage Complete: Triage complete  Chief Complaint fall  Triage Note EMS reports from home, Fall while transferring to chair, Pt c/o right leg and hip and arm pain. Pt denies LOC, no blood thinners, states she did hit head. Pt has pacemaker. EMS states Pt refused IV and reiterated does not ant an IV at arrival.  BP 170/90 HR 94 RR 18 Sp02 98 RA   Allergies Allergies  Allergen Reactions  . Avelox [Moxifloxacin Hcl In Nacl] Itching and Rash    Level of Care/Admitting Diagnosis ED Disposition    ED Disposition Condition [facility] Area: [place] [[id number]]  Level of Care: Telemetry [5]  Admit to tele based on following criteria: Other see comments  Comments: Patient with pacer for Mobitz 2 AV block  Covid Evaluation: N/A  Diagnosis: Open right hip fracture [facility]) [[id number]]  Admitting Physician: Allie Bossier [[id number]]  Attending Physician: Allie Bossier [[id number]]  Estimated length of stay: 3 - 4 days  Certification:: I certify this patient will need inpatient services for at least 2 midnights  PT Class (Do Not Modify): Inpatient [101]  PT Acc Code (Do Not Modify): Private [1]       B Medical/Surgery History Past Medical History:  Diagnosis Date  . Abnormal mammogram 07/20/2011  . Acute respiratory failure with hypoxia ([place]) 09/30/2016  . Alcohol abuse 02/19/2018  . ALLERGIC RHINITIS 10/13/2010   Qualifier: Diagnosis of  By: Ronnald Ramp CMA, [place]    . Anxiety    "occasionally"  . ASTHMA 10/13/2010   Qualifier: Diagnosis of  By: Ronnald Ramp CMA, [place]    . Blunt trauma of nose 06/06/2012  . Chest pain 08/18/2011  . Chest pain on breathing 05/11/2015  . Chronic  respiratory failure with hypoxia ([place]) 04/24/2018  . CKD (chronic kidney disease) stage 2, GFR 60-89 ml/min 09/30/2016  . COPD exacerbation ([place]) 08/16/2013  . E. coli UTI 02/22/2018  . Elevated BP 02/23/2012  . Generalized anxiety disorder 01/11/2012  . GERD (gastroesophageal reflux disease)   . Head injury 10/23/2012  . 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 Hemoptysis 02/19/2018  . 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 HTN (hypertension) 11/24/2016  . Indigestion 04/23/2012  . Left knee pain 07/11/2017  . Left shoulder pain 05/22/2015  . Lumbar radicular pain 06/13/2012  . MDD (major depressive disorder), single episode, severe , no psychosis ([place])   . Pacemaker 04/22/2015  . Palpitations 08/16/2013  . Paroxysmal atrial fibrillation ([place]) 09/30/2016  . Pneumonia 02/19/2018  . PVD (peripheral vascular disease) ([place]) 04/04/2017  . RBBB (right bundle branch block) 07/31/2011  . Swelling of arm 05/22/2015  . Swollen uvula 11/14/2014  . Tinnitus of both ears 10/23/2012  . Tobacco abuse    QUIT 12/13   . Vagina bleeding 02/19/2018  . Vitamin D deficiency 01/30/2013   Past Surgical History:  Procedure Laterality Date  . [place]  . EP IMPLANTABLE DEVICE N/A 01/07/2015   Procedure: Pacemaker Implant;  Surgeon: Evans Lance, MD;  Location: [facility];  Service: Cardiovascular;  Laterality: N/A;  . PACEMAKER INSERTION    . [place]     A IV  Location/Drains/Wounds Patient Lines/Drains/Airways Status   Active Line/Drains/Airways    Name:   Placement date:   Placement time:   Site:   Days:   Peripheral IV 12/23/18 Left Forearm   12/23/18    0813    Forearm   less than 1   Wound / Incision (Open or Dehisced) 02/19/18 Non-pressure wound Buttocks Left;Medial 2cm x 1cm x 0.1cm   02/19/18    0100    Buttocks   307          Intake/Output Last 24 hours  Intake/Output Summary (Last 24 hours) at 12/23/2018 1333 Last data filed at 12/23/2018 0944 Gross  per 24 hour  Intake 1000 ml  Output -  Net 1000 ml    Labs/Imaging Results for orders placed or performed during the hospital encounter of 12/23/18 (from the past 48 hour(s))  Basic metabolic panel     Status: Abnormal   Collection Time: 12/23/18  7:48 AM  Result Value Ref Range   Sodium 139 135 - 145 mmol/L   Potassium 5.1 3.5 - 5.1 mmol/L   Chloride 101 98 - 111 mmol/L   CO2 26 22 - 32 mmol/L   Glucose, Bld 139 (H) 70 - 99 mg/dL   BUN 14 8 - 23 mg/dL   Creatinine, Ser 1.10 (H) 0.44 - 1.00 mg/dL   Calcium 8.7 (L) 8.9 - 10.3 mg/dL   GFR calc non Af Amer 49 (L) >60 mL/min   GFR calc Af Amer 57 (L) >60 mL/min   Anion gap 12 5 - 15    Comment: Performed at [facility], [place] [address], [place], [place] [address]  CBC WITH DIFFERENTIAL     Status: Abnormal   Collection Time: 12/23/18  7:48 AM  Result Value Ref Range   WBC 8.3 4.0 - 10.5 K/uL   RBC 3.27 (L) 3.87 - 5.11 MIL/uL   Hemoglobin 12.4 12.0 - 15.0 g/dL   HCT 37.3 36.0 - 46.0 %   MCV 114.1 (H) 80.0 - 100.0 fL   MCH 37.9 (H) 26.0 - 34.0 pg   MCHC 33.2 30.0 - 36.0 g/dL   RDW 13.2 11.5 - 15.5 %   Platelets 217 150 - 400 K/uL   nRBC 0.0 0.0 - 0.2 %   Neutrophils Relative % 59 %   Neutro Abs 4.9 1.7 - 7.7 K/uL   Lymphocytes Relative 21 %   Lymphs Abs 1.7 0.7 - 4.0 K/uL   Monocytes Relative 16 %   Monocytes Absolute 1.4 (H) 0.1 - 1.0 K/uL   Eosinophils Relative 2 %   Eosinophils Absolute 0.2 0.0 - 0.5 K/uL   Basophils Relative 1 %   Basophils Absolute 0.1 0.0 - 0.1 K/uL   Smear Review MORPHOLOGY UNREMARKABLE    Immature Granulocytes 1 %   Abs Immature Granulocytes 0.06 0.00 - 0.07 K/uL    Comment: Performed at [facility], [place] [address], [place], [place] [address]  Protime-INR     Status: None   Collection Time: 12/23/18  7:48 AM  Result Value Ref Range   Prothrombin Time 13.0 11.4 - 15.2 seconds   INR 1.0 0.8 - 1.2    Comment: (NOTE) INR goal varies based on device and  disease states. Performed at [facility], [place] [address], [place], [place] [address]   Type and screen [place]     Status: None   Collection Time: 12/23/18  8:10 AM  Result Value Ref Range  ABO/RH(D) O NEG    Antibody Screen NEG    Sample Expiration      12/26/2018,2359 Performed at [facility], [place] [address] [place], [place] [address]   SARS Coronavirus 2 (CEPHEID - Performed in [place] hospital lab), Hosp Order     Status: None   Collection Time: 12/23/18  9:36 AM  Result Value Ref Range   SARS Coronavirus 2 NEGATIVE NEGATIVE    Comment: (NOTE) If result is NEGATIVE SARS-CoV-2 target nucleic acids are NOT DETECTED. The SARS-CoV-2 RNA is generally detectable in upper and lower  respiratory specimens during the acute phase of infection. The lowest  concentration of SARS-CoV-2 viral copies this assay can detect is 250  copies / mL. A negative result does not preclude SARS-CoV-2 infection  and should not be used as the sole basis for treatment or other  patient management decisions.  A negative result may occur with  improper specimen collection / handling, submission of specimen other  than nasopharyngeal swab, presence of viral mutation(s) within the  areas targeted by this assay, and inadequate number of viral copies  (<250 copies / mL). A negative result must be combined with clinical  observations, patient history, and epidemiological information. If result is POSITIVE SARS-CoV-2 target nucleic acids are DETECTED. The SARS-CoV-2 RNA is generally detectable in upper and lower  respiratory specimens dur ing the acute phase of infection.  Positive  results are indicative of active infection with SARS-CoV-2.  Clinical  correlation with patient history and other diagnostic information is  necessary to determine patient infection status.  Positive results do  not rule out bacterial infection or co-infection with  other viruses. If result is PRESUMPTIVE POSTIVE SARS-CoV-2 nucleic acids MAY BE PRESENT.   A presumptive positive result was obtained on the submitted specimen  and confirmed on repeat testing.  While 2019 novel coronavirus  (SARS-CoV-2) nucleic acids may be present in the submitted sample  additional confirmatory testing may be necessary for epidemiological  and / or clinical management purposes  to differentiate between  SARS-CoV-2 and other Sarbecovirus currently known to infect humans.  If clinically indicated additional testing with an alternate test  methodology [phone]) is advised. The SARS-CoV-2 RNA is generally  detectable in upper and lower respiratory sp ecimens during the acute  phase of infection. The expected result is Negative. Fact Sheet for Patients:  StrictlyIdeas.no Fact Sheet for Healthcare Providers: BankingDealers.co.za This test is not yet approved or cleared by the [place] FDA and has been authorized for detection and/or diagnosis of SARS-CoV-2 by FDA under an Emergency Use Authorization (EUA).  This EUA will remain in effect (meaning this test can be used) for the duration of the COVID-19 declaration under Section 564(b)(1) of the Act, 21 U.S.C. section 360bbb-3(b)(1), unless the authorization is terminated or revoked sooner. Performed at [facility], [place] [address] [place], [place] [address]    Dg Elbow Complete Right  Result Date: 12/23/2018 CLINICAL DATA:  Right elbow pain after fall. EXAM: RIGHT ELBOW - COMPLETE 3+ VIEW COMPARISON:  None. FINDINGS: There is no evidence of fracture, dislocation, or joint effusion. There is no evidence of arthropathy or other focal bone abnormality. Soft tissues are unremarkable. IMPRESSION: Negative. Electronically Signed   By: Marijo Conception M.D.   On: 12/23/2018 09:26   Dg Chest Port 1 View  Result Date: 12/23/2018 CLINICAL DATA:  Fall, right  hip fracture EXAM: PORTABLE CHEST 1 VIEW COMPARISON:  1919 FINDINGS: Left subclavian  2 lead pacer noted. Normal heart size and vascularity. Minor basilar atelectasis and slightly decreased lung volumes. No focal pneumonia, collapse or consolidation. Negative for edema, effusion or pneumothorax. Atherosclerosis noted. Trachea is midline. Bones are osteopenic. IMPRESSION: Basilar atelectasis. Atherosclerosis No acute chest process. Electronically Signed   By: Jerilynn Mages.  Shick M.D.   On: 12/23/2018 09:34   Dg Hip Unilat With Pelvis 2-3 Views Right  Result Date: 12/23/2018 CLINICAL DATA:  Right hip pain after fall today. EXAM: DG HIP (WITH OR WITHOUT PELVIS) 2-3V RIGHT COMPARISON:  None. FINDINGS: Severely comminuted and angulated fracture is seen involving the intertrochanteric region of the proximal right femur. Left hip appears normal. Vascular calcifications are noted. IMPRESSION: Severely comminuted and angulated intertrochanteric fracture of proximal right femur. Electronically Signed   By: Marijo Conception M.D.   On: 12/23/2018 09:25    Pending Labs Unresulted Labs (From admission, onward)    Start     Ordered   12/24/18 3734  Basic metabolic panel  Daily,   R     12/23/18 1027   12/24/18 0500  Magnesium  Daily,   R     12/23/18 1027   12/23/18 1021  CBC  (heparin)  Once,   R    Comments:  Baseline for heparin therapy IF NOT ALREADY DRAWN.  Notify MD if PLT < 100 K.    12/23/18 1027   12/23/18 1021  Creatinine, serum  (heparin)  Once,   R    Comments:  Baseline for heparin therapy IF NOT ALREADY DRAWN.    12/23/18 1027   12/23/18 1014  Ethanol  ONCE - STAT,   R     12/23/18 1014   12/23/18 1013  Rapid urine drug screen (hospital performed)  ONCE - STAT,   R     12/23/18 1014          Vitals/Pain Today's Vitals   12/23/18 1200 12/23/18 1230 12/23/18 1300 12/23/18 1330  BP: (!) 148/65 (!) 145/74 138/70 (!) 157/68  Pulse: (!) 101 (!) 105 98 (!) 102  Resp:  20    Temp:      TempSrc:       SpO2: 91% 95% 96% 97%  PainSc:        Isolation Precautions No active isolations  Medications Medications  0.9 %  sodium chloride infusion ( Intravenous Stopped 12/23/18 0944)  fentaNYL (SUBLIMAZE) injection 50 mcg (50 mcg Intravenous Given 12/23/18 0814)  heparin injection 5,000 Units (has no administration in time range)  0.9 %  sodium chloride infusion (has no administration in time range)  acetaminophen (TYLENOL) tablet 650 mg (has no administration in time range)    Or  acetaminophen (TYLENOL) suppository 650 mg (has no administration in time range)  oxyCODONE (Oxy IR/ROXICODONE) immediate release tablet 5 mg (has no administration in time range)  traZODone (DESYREL) tablet 25 mg (has no administration in time range)  docusate sodium (COLACE) capsule 100 mg (has no administration in time range)  ondansetron (ZOFRAN) tablet 4 mg (has no administration in time range)    Or  ondansetron (ZOFRAN) injection 4 mg (has no administration in time range)  albuterol (PROVENTIL) (2.5 MG/3ML) 0.083% nebulizer solution 2.5 mg (has no administration in time range)  mometasone-formoterol (DULERA) 200-5 MCG/ACT inhaler 2 puff (has no administration in time range)  ipratropium (ATROVENT) nebulizer solution 0.5 mg (has no administration in time range)  methocarbamol (ROBAXIN) 500 mg in dextrose 5 % 50 mL IVPB (has no administration in time  range)  metoprolol tartrate (LOPRESSOR) tablet 25 mg (has no administration in time range)  multivitamin with minerals tablet 1 tablet (has no administration in time range)  LORazepam (ATIVAN) injection 2-3 mg (has no administration in time range)  folic acid injection 1 mg (has no administration in time range)  thiamine (B-1) injection 100 mg (has no administration in time range)  sodium chloride 0.9 % 1,000 mL with thiamine 295 mg, folic acid 1 mg, multivitamins adult 10 mL infusion (has no administration in time range)  perflutren lipid microspheres (DEFINITY)  IV suspension (3 mLs Intravenous Given 12/23/18 1328)  ondansetron (ZOFRAN) injection 4 mg (4 mg Intravenous Given 12/23/18 0814)  morphine 4 MG/ML injection 4 mg (4 mg Intravenous Given 12/23/18 0957)    Mobility non-ambulatory Moderate fall risk   Focused Assessments Hip Fx   R Recommendations: See Admitting Provider Note  Report given to:   Additional Notes:

## 2018-12-24 ENCOUNTER — Inpatient Hospital Stay (HOSPITAL_COMMUNITY): Payer: PPO | Admitting: Certified Registered Nurse Anesthetist

## 2018-12-24 ENCOUNTER — Inpatient Hospital Stay (HOSPITAL_COMMUNITY): Payer: PPO

## 2018-12-24 ENCOUNTER — Encounter (HOSPITAL_COMMUNITY)
Admission: EM | Disposition: A | Discharge status: Skilled Nursing Facility | Payer: Self-pay | Source: Home / Self Care | Attending: Family Medicine

## 2018-12-24 ENCOUNTER — Encounter (HOSPITAL_COMMUNITY): Payer: Self-pay | Admitting: Certified Registered Nurse Anesthetist

## 2018-12-24 HISTORY — PX: INTRAMEDULLARY (IM) NAIL INTERTROCHANTERIC: SHX5875

## 2018-12-24 LAB — BASIC METABOLIC PANEL
Anion gap: 8 (ref 5–15)
BUN: 18 mg/dL (ref 8–23)
CO2: 25 mmol/L (ref 22–32)
Calcium: 7.7 mg/dL — ABNORMAL LOW (ref 8.9–10.3)
Chloride: 106 mmol/L (ref 98–111)
Creatinine, Ser: 0.91 mg/dL (ref 0.44–1.00)
GFR calc Af Amer: >60 mL/min (ref >60)
GFR calc non Af Amer: >60 mL/min (ref >60)
Glucose, Bld: 128 mg/dL — ABNORMAL HIGH (ref 70–99)
Potassium: 4.2 mmol/L (ref 3.5–5.1)
Sodium: 139 mmol/L (ref 135–145)

## 2018-12-24 LAB — MAGNESIUM: Magnesium: 1.3 mg/dL — ABNORMAL LOW (ref 1.7–2.4)

## 2018-12-24 LAB — SURGICAL PCR SCREEN
MRSA, PCR: POSITIVE — AB
Staphylococcus aureus: POSITIVE — AB

## 2018-12-24 SURGERY — FIXATION, FRACTURE, INTERTROCHANTERIC, WITH INTRAMEDULLARY ROD
Anesthesia: Spinal | Laterality: Right

## 2018-12-24 MED ORDER — PROPOFOL 10 MG/ML IV BOLUS
INTRAVENOUS | Status: DC | PRN
  Administered 2018-12-24 (×2): 20 mg via INTRAVENOUS

## 2018-12-24 MED ORDER — CEFAZOLIN SODIUM-DEXTROSE 2-4 GM/100ML-% IV SOLN
2.0000 g | Freq: Four times a day (QID) | INTRAVENOUS | Status: AC
  Administered 2018-12-24 – 2018-12-25 (×2): 2 g via INTRAVENOUS
  Filled 2018-12-24 (×2): qty 100

## 2018-12-24 MED ORDER — ACETAMINOPHEN 500 MG PO TABS
1000.0000 mg | ORAL_TABLET | Freq: Once | ORAL | Status: AC
  Administered 2018-12-24: 1000 mg via ORAL
  Filled 2018-12-24: qty 2

## 2018-12-24 MED ORDER — PROPOFOL 500 MG/50ML IV EMUL
INTRAVENOUS | Status: DC | PRN
  Administered 2018-12-24: 75 ug/kg/min via INTRAVENOUS

## 2018-12-24 MED ORDER — MAGNESIUM OXIDE 400 (241.3 MG) MG PO TABS
400.0000 mg | ORAL_TABLET | Freq: Two times a day (BID) | ORAL | Status: AC
  Administered 2018-12-25 – 2018-12-26 (×4): 400 mg via ORAL
  Filled 2018-12-24 (×4): qty 1

## 2018-12-24 MED ORDER — MAGNESIUM SULFATE 2 GM/50ML IV SOLN
2.0000 g | Freq: Once | INTRAVENOUS | Status: AC
  Administered 2018-12-24: 2 g via INTRAVENOUS
  Filled 2018-12-24: qty 50

## 2018-12-24 MED ORDER — CHLORHEXIDINE GLUCONATE 4 % EX LIQD
60.0000 mL | Freq: Once | CUTANEOUS | Status: AC
  Administered 2018-12-24: 4 via TOPICAL
  Filled 2018-12-24: qty 60

## 2018-12-24 MED ORDER — POVIDONE-IODINE 10 % EX SWAB
2.0000 "application " | Freq: Once | CUTANEOUS | Status: AC
  Administered 2018-12-24: 2 via TOPICAL

## 2018-12-24 MED ORDER — PHENYLEPHRINE HCL (PRESSORS) 10 MG/ML IV SOLN
INTRAVENOUS | Status: AC
  Filled 2018-12-24: qty 1

## 2018-12-24 MED ORDER — METOCLOPRAMIDE HCL 5 MG/ML IJ SOLN: 5.0000 mg | Freq: Three times a day (TID) | INTRAMUSCULAR | Status: DC | PRN

## 2018-12-24 MED ORDER — POLYETHYLENE GLYCOL 3350 17 G PO PACK
17.0000 g | PACK | Freq: Every day | ORAL | Status: DC | PRN
  Administered 2018-12-26: 17 g via ORAL
  Filled 2018-12-24: qty 1

## 2018-12-24 MED ORDER — FLEET ENEMA 7-19 GM/118ML RE ENEM: 1.0000 | ENEMA | Freq: Once | RECTAL | Status: DC | PRN

## 2018-12-24 MED ORDER — GABAPENTIN 300 MG PO CAPS
300.0000 mg | ORAL_CAPSULE | Freq: Once | ORAL | Status: AC
  Administered 2018-12-24: 300 mg via ORAL
  Filled 2018-12-24: qty 1

## 2018-12-24 MED ORDER — DEXAMETHASONE SODIUM PHOSPHATE 10 MG/ML IJ SOLN
INTRAMUSCULAR | Status: DC | PRN
  Administered 2018-12-24: 4 mg via INTRAVENOUS

## 2018-12-24 MED ORDER — ALBUTEROL SULFATE (2.5 MG/3ML) 0.083% IN NEBU: 2.5000 mg | INHALATION_SOLUTION | Freq: Four times a day (QID) | RESPIRATORY_TRACT | Status: DC | PRN

## 2018-12-24 MED ORDER — ACETAMINOPHEN 325 MG PO TABS
325.0000 mg | ORAL_TABLET | Freq: Four times a day (QID) | ORAL | Status: DC | PRN
  Administered 2018-12-27: 650 mg via ORAL
  Filled 2018-12-24: qty 2

## 2018-12-24 MED ORDER — FENTANYL CITRATE (PF) 100 MCG/2ML IJ SOLN
INTRAMUSCULAR | Status: DC | PRN
  Administered 2018-12-24: 50 ug via INTRAVENOUS

## 2018-12-24 MED ORDER — PROPOFOL 10 MG/ML IV BOLUS
INTRAVENOUS | Status: AC
  Filled 2018-12-24: qty 60

## 2018-12-24 MED ORDER — FENTANYL CITRATE (PF) 100 MCG/2ML IJ SOLN
INTRAMUSCULAR | Status: AC
  Filled 2018-12-24: qty 2

## 2018-12-24 MED ORDER — FENTANYL CITRATE (PF) 100 MCG/2ML IJ SOLN: 25.0000 ug | INTRAMUSCULAR | Status: DC | PRN

## 2018-12-24 MED ORDER — CEFAZOLIN SODIUM-DEXTROSE 2-4 GM/100ML-% IV SOLN
2.0000 g | INTRAVENOUS | Status: AC
  Administered 2018-12-24: 14:00:00 2 g via INTRAVENOUS
  Filled 2018-12-24 (×2): qty 100

## 2018-12-24 MED ORDER — METOCLOPRAMIDE HCL 5 MG PO TABS: 5.0000 mg | ORAL_TABLET | Freq: Three times a day (TID) | ORAL | Status: DC | PRN

## 2018-12-24 MED ORDER — BUPIVACAINE IN DEXTROSE 0.75-8.25 % IT SOLN
INTRATHECAL | Status: DC | PRN
  Administered 2018-12-24: 1.6 mL via INTRATHECAL

## 2018-12-24 MED ORDER — ONDANSETRON HCL 4 MG PO TABS
4.0000 mg | ORAL_TABLET | Freq: Four times a day (QID) | ORAL | Status: DC | PRN
  Administered 2018-12-27: 12:00:00 4 mg via ORAL
  Filled 2018-12-24: qty 1

## 2018-12-24 MED ORDER — LACTATED RINGERS IV SOLN
INTRAVENOUS | Status: DC | PRN
  Administered 2018-12-24: 13:00:00 via INTRAVENOUS

## 2018-12-24 MED ORDER — ENOXAPARIN SODIUM 40 MG/0.4ML ~~LOC~~ SOLN: 40.0000 mg | SUBCUTANEOUS | 0 refills | Status: AC

## 2018-12-24 MED ORDER — HYDROCODONE-ACETAMINOPHEN 5-325 MG PO TABS
1.0000 | ORAL_TABLET | Freq: Four times a day (QID) | ORAL | Status: DC | PRN
  Administered 2018-12-25 (×2): 2 via ORAL
  Administered 2018-12-26 – 2018-12-28 (×2): 1 via ORAL
  Administered 2018-12-28 – 2018-12-30 (×5): 2 via ORAL
  Administered 2018-12-30 – 2018-12-31 (×2): 1 via ORAL
  Administered 2018-12-31 – 2019-01-01 (×4): 2 via ORAL
  Administered 2019-01-01: 1 via ORAL
  Filled 2018-12-24 (×5): qty 2
  Filled 2018-12-24: qty 1
  Filled 2018-12-24 (×2): qty 2
  Filled 2018-12-24: qty 1
  Filled 2018-12-24: qty 2
  Filled 2018-12-24: qty 1
  Filled 2018-12-24: qty 2
  Filled 2018-12-24: qty 1
  Filled 2018-12-24: qty 2
  Filled 2018-12-24 (×2): qty 1
  Filled 2018-12-24: qty 2

## 2018-12-24 MED ORDER — HYDROMORPHONE HCL 1 MG/ML IJ SOLN
0.2500 mg | INTRAMUSCULAR | Status: DC | PRN
  Administered 2018-12-25 – 2018-12-29 (×2): 0.5 mg via INTRAVENOUS
  Filled 2018-12-24 (×3): qty 0.5

## 2018-12-24 MED ORDER — LIDOCAINE HCL (CARDIAC) PF 100 MG/5ML IV SOSY
PREFILLED_SYRINGE | INTRAVENOUS | Status: DC | PRN
  Administered 2018-12-24: 80 mg via INTRAVENOUS

## 2018-12-24 MED ORDER — DEXAMETHASONE SODIUM PHOSPHATE 10 MG/ML IJ SOLN
INTRAMUSCULAR | Status: AC
  Filled 2018-12-24: qty 1

## 2018-12-24 MED ORDER — TRANEXAMIC ACID-NACL 1000-0.7 MG/100ML-% IV SOLN
1000.0000 mg | INTRAVENOUS | Status: AC
  Administered 2018-12-24: 1000 mg via INTRAVENOUS
  Filled 2018-12-24: qty 100

## 2018-12-24 MED ORDER — SODIUM CHLORIDE 0.9 % IV SOLN
INTRAVENOUS | Status: DC | PRN
  Administered 2018-12-24: 14:00:00 50 ug/min via INTRAVENOUS

## 2018-12-24 MED ORDER — ONDANSETRON HCL 4 MG/2ML IJ SOLN: 4.0000 mg | Freq: Once | INTRAMUSCULAR | Status: DC | PRN

## 2018-12-24 MED ORDER — ACETAMINOPHEN 500 MG PO TABS
500.0000 mg | ORAL_TABLET | Freq: Four times a day (QID) | ORAL | Status: AC
  Administered 2018-12-24 – 2018-12-25 (×4): 500 mg via ORAL
  Filled 2018-12-24 (×4): qty 1

## 2018-12-24 MED ORDER — DOCUSATE SODIUM 100 MG PO CAPS: 100.0000 mg | ORAL_CAPSULE | Freq: Two times a day (BID) | ORAL | Status: DC

## 2018-12-24 MED ORDER — HYDROCODONE-ACETAMINOPHEN 5-325 MG PO TABS: 1.0000 | ORAL_TABLET | Freq: Four times a day (QID) | ORAL | 0 refills | Status: AC | PRN

## 2018-12-24 MED ORDER — ONDANSETRON HCL 4 MG/2ML IJ SOLN
4.0000 mg | Freq: Four times a day (QID) | INTRAMUSCULAR | Status: DC | PRN
  Administered 2018-12-25: 4 mg via INTRAVENOUS
  Filled 2018-12-24: qty 2

## 2018-12-24 MED ORDER — VANCOMYCIN HCL IN DEXTROSE 1-5 GM/200ML-% IV SOLN
1000.0000 mg | Freq: Once | INTRAVENOUS | Status: AC
  Administered 2018-12-24: 1000 mg via INTRAVENOUS
  Filled 2018-12-24: qty 200

## 2018-12-24 MED ORDER — ENOXAPARIN SODIUM 40 MG/0.4ML ~~LOC~~ SOLN
40.0000 mg | SUBCUTANEOUS | Status: DC
  Administered 2018-12-25 – 2019-01-01 (×8): 40 mg via SUBCUTANEOUS
  Filled 2018-12-24 (×9): qty 0.4

## 2018-12-24 MED ORDER — SODIUM CHLORIDE 0.9 % IR SOLN
Status: DC | PRN
  Administered 2018-12-24: 1000 mL

## 2018-12-24 MED ORDER — ONDANSETRON HCL 4 MG/2ML IJ SOLN
INTRAMUSCULAR | Status: AC
  Filled 2018-12-24: qty 2

## 2018-12-24 MED ORDER — BISACODYL 10 MG RE SUPP: 10.0000 mg | Freq: Every day | RECTAL | Status: DC | PRN

## 2018-12-24 SURGICAL SUPPLY — 38 items
APL PRP STRL LF DISP 70% ISPRP (MISCELLANEOUS) ×1
BIT DRILL AO GAMMA 4.2X180 (BIT) ×2 IMPLANT
CHLORAPREP W/TINT 26 (MISCELLANEOUS) ×3 IMPLANT
CLOSURE STERI-STRIP 1/2X4 (GAUZE/BANDAGES/DRESSINGS) ×1
CLSR STERI-STRIP ANTIMIC 1/2X4 (GAUZE/BANDAGES/DRESSINGS) ×2 IMPLANT
COVER MAYO STAND STRL (DRAPES) ×2 IMPLANT
COVER PERINEAL POST (MISCELLANEOUS) ×3 IMPLANT
COVER SURGICAL LIGHT HANDLE (MISCELLANEOUS) ×3 IMPLANT
COVER WAND RF STERILE (DRAPES) ×3 IMPLANT
DRAPE STERI IOBAN 125X83 (DRAPES) ×3 IMPLANT
DRESSING ALLEVYN LIFE SACRUM (GAUZE/BANDAGES/DRESSINGS) ×2 IMPLANT
DRSG MEPILEX BORDER 4X4 (GAUZE/BANDAGES/DRESSINGS) ×8 IMPLANT
ELECT REM PT RETURN 15FT ADLT (MISCELLANEOUS) ×3 IMPLANT
GLOVE BIO SURGEON STRL SZ7.5 (GLOVE) ×6 IMPLANT
GLOVE BIOGEL PI IND STRL 8 (GLOVE) ×2 IMPLANT
GLOVE BIOGEL PI INDICATOR 8 (GLOVE) ×4
GOWN STRL REUS W/ TWL LRG LVL3 (GOWN DISPOSABLE) ×3 IMPLANT
GOWN STRL REUS W/TWL LRG LVL3 (GOWN DISPOSABLE) ×9
GUIDEROD T2 3X1000 (ROD) ×2 IMPLANT
K-WIRE  3.2X450M STR (WIRE) ×4
K-WIRE 3.2X450M STR (WIRE) ×2
KIT NAIL LONG 10X380MMX125 (Nail) ×2 IMPLANT
KIT TURNOVER KIT A (KITS) IMPLANT
KWIRE 3.2X450M STR (WIRE) IMPLANT
MANIFOLD NEPTUNE II (INSTRUMENTS) ×3 IMPLANT
NS IRRIG 1000ML POUR BTL (IV SOLUTION) ×3 IMPLANT
PACK GENERAL/GYN (CUSTOM PROCEDURE TRAY) ×3 IMPLANT
SCREW LAG GAMMA 3 TI 10.5X100M (Screw) ×2 IMPLANT
SCREW LOCKING T2 F/T  5MMX45MM (Screw) ×2 IMPLANT
SCREW LOCKING T2 F/T 5MMX45MM (Screw) IMPLANT
SUT MNCRL AB 4-0 PS2 18 (SUTURE) ×4 IMPLANT
SUT MON AB 2-0 CT1 36 (SUTURE) ×3 IMPLANT
SUT VIC AB 0 CT1 27 (SUTURE) ×3
SUT VIC AB 0 CT1 27XBRD ANBCTR (SUTURE) ×1 IMPLANT
SUT VIC AB 2-0 CT1 27 (SUTURE) ×6
SUT VIC AB 2-0 CT1 TAPERPNT 27 (SUTURE) IMPLANT
TOWEL OR 17X26 10 PK STRL BLUE (TOWEL DISPOSABLE) ×3 IMPLANT
TOWEL OR NON WOVEN STRL DISP B (DISPOSABLE) ×3 IMPLANT

## 2018-12-24 NOTE — Progress Notes (Signed)
PROGRESS NOTE    Jenna Marquez  RKY:[id number] DOB: 03/02/1946 DOA: 12/23/2018 PCP: Darreld Mclean, MD   Brief Narrative: As per HPI:  73 y.o. BF PMHx anxiety, major depressive disorder, COPD, chronic respiratory failure with hypoxia, tobacco abuse, EtOH abuse, high-grade AV block S/P pacemaker, paroxysmal atrial fibrillation not on blood thinners), RBBB, CKD stage III who was ambulatory at baseline living with her niece admitted after a fall while trying to transfer to chair and landing on the right hip and arm following which is complaining of head pain leg pain.  Patient unable to stand up. In the ER vitals stable found to have right femur fracture, orthopedic consulted for operative intervention and was admitted.  Subjective: This morning complains of ongoing pain on the hip. She is on room air not in acute distress.  Denies chest pain nausea vomiting, cough.  Assessment & Plan:   Closed displaced comminuted intertrochanteric fracture of right femur: From fall.  Orthopedic surgery planning for operative intervention.  Patient is intermediate risk for the procedure.  Reports at baseline was ambulatory without chest pain or dyspnea with activity w MET>4, no prior history of CAD, CVA, insulin use.  Continue pain control with IV/oral opiates.  Anticipate skilled nursing facility placement.  COPD/hx of Tobacco abuse: On room air, respiratory status is stable, optimize respiratory status and continue on Dulera, bronchodilators, incentive spirometry post op.  Paroxysmal atrial fibrillation history/Hx of Mobitz type 2 second degree AV block, s/p Pacemaker : Currently paced rhythm/normal sinus rhythm, echocardiogram obtained preoperatively and shows normal systolic function, no acute significant finding.  Hypomagnesemia mag 1.3, replete with IV and oral magnesium  HTN: Blood pressure controlled on metoprolol.  CKD stage 3: Creatinine stable at 1.9-1.1.  Generalized anxiety disorder/MDD:  Mood is stable, continue home Neurontin.  Alcohol use: Admits drinking brandy at home, continue CIWA Ativan.  DVT prophylaxis: SCD.  Chemical prophylaxis as per orthopedic surgery post procedure.  I will stop heparin. Code Status: DNR,confiremd with daughter-in-law Dr. Arvid Right by the admitting physician Family Communication: Discussed with the patient verbalized understanding.   Disposition Plan: remains inpatient pending clinical improvement. Awaiting operative intervention.   Consultants:  Orthopedic surgery  Procedures:  Limited echocardiogram 5/10  The left ventricle has normal systolic function, with an ejection fraction of 60-65%. The cavity size was normal. Left ventricular diastolic function could not be evaluated.  2. The right ventricle has normal systolc function. The cavity was normal. There is no increase in right ventricular wall thickness. Right ventricular systolic pressure is moderately elevated with an estimated pressure of 40.9 mmHg.  3. No evidence of mitral valve stenosis.  4. The aortic valve is tricuspid No stenosis of the aortic valve.  5. Pulmonic valve regurgitation was not assessed by color flow Doppler Antimicrobials: Anti-infectives (From admission, onward)   Start     Dose/Rate Route Frequency Ordered Stop   12/24/18 1230  vancomycin (VANCOCIN) IVPB 1000 mg/200 mL premix     1,000 mg 200 mL/hr over 60 Minutes Intravenous  Once 12/24/18 0805     12/24/18 0815  ceFAZolin (ANCEF) IVPB 2g/100 mL premix     2 g 200 mL/hr over 30 Minutes Intravenous On call to O.R. 12/24/18 0805 12/25/18 0559       Objective: Vitals:   12/24/18 0500 12/24/18 0617 12/24/18 0801 12/24/18 1059  BP:  118/61  (!) 105/56  Pulse:  92  (!) 102  Resp:  14    Temp:  98.4 F (36.9 C)  TempSrc:  Oral    SpO2:  97% 94%   Weight: 65.2 kg     Height:        Intake/Output Summary (Last 24 hours) at 12/24/2018 1102 Last data filed at 12/24/2018 0600 Gross per 24 hour  Intake  1174.69 ml  Output 100 ml  Net 1074.69 ml   Filed Weights   12/23/18 1408 12/23/18 1511 12/24/18 0500  Weight: 62.8 kg 62.8 kg 65.2 kg   Weight change:   Body mass index is 22.85 kg/m.  Intake/Output from previous day: 05/10 0701 - 05/11 0700 In: 2174.7 [I.V.:2124.7; IV Piggyback:50] Out: 100 [Urine:100] Intake/Output this shift: No intake/output data recorded.  Examination:  General exam: Appears calm and comfortable,Not in distress, older fore the age. HEENT:PERRL,Oral mucosa moist, Ear/Nose normal on gross exam. Respiratory system: Bilateral equal air entry, normal vesicular breath sounds, no wheezes or crackles. Cardiovascular system: S1 & S2 heard,No JVD, murmurs. Gastrointestinal system: Abdomen is  soft, non tender, non distended, BS +.  Nervous System:Alert and oriented. No focal neurological deficits/moving extremities, sensation intact. Extremities: Right hip tender to touch, appears shortened and externally rotated, no edema, no clubbing. distal peripheral pulses palpable. Skin: No rashes, lesions, no icterus. MSK: Normal muscle bulk,tone,power.  Medications:  Scheduled Meds: . acetaminophen  1,000 mg Oral Once  . chlorhexidine  60 mL Topical Once  . docusate sodium  100 mg Oral BID  . folic acid  1 mg Intravenous Daily  . gabapentin  300 mg Oral Once  . heparin  5,000 Units Subcutaneous Q8H  . ipratropium-albuterol  3 mL Nebulization QID  . [START ON 12/25/2018] magnesium oxide  400 mg Oral BID  . metoprolol tartrate  25 mg Oral BID  . mometasone-formoterol  2 puff Inhalation BID  . multivitamin with minerals  1 tablet Oral Daily  . mupirocin ointment  1 application Nasal BID  . povidone-iodine  2 application Topical Once  . thiamine injection  100 mg Intravenous Daily   Continuous Infusions: . sodium chloride Stopped (12/23/18 0944)  . sodium chloride 75 mL/hr at 12/24/18 0600  .  ceFAZolin (ANCEF) IV    . magnesium sulfate bolus IVPB 2 g (12/24/18  1058)  . methocarbamol (ROBAXIN) IV Stopped (12/24/18 0106)  . tranexamic acid    . vancomycin      Data Reviewed: I have personally reviewed following labs and imaging studies  CBC: Recent Labs  Lab 12/23/18 0748 12/23/18 1425  WBC 8.3 9.5  NEUTROABS 4.9  --   HGB 12.4 11.0*  HCT 37.3 33.7*  MCV 114.1* 115.8*  PLT 217 700   Basic Metabolic Panel: Recent Labs  Lab 12/23/18 0748 12/23/18 1425 12/24/18 0355  NA 139  --  139  K 5.1  --  4.2  CL 101  --  106  CO2 26  --  25  GLUCOSE 139*  --  128*  BUN 14  --  18  CREATININE 1.10* 0.92 0.91  CALCIUM 8.7*  --  7.7*  MG  --   --  1.3*   GFR: Estimated Creatinine Clearance: 51 mL/min (by C-G formula based on SCr of 0.91 mg/dL). Liver Function Tests: No results for input(s): AST, ALT, ALKPHOS, BILITOT, PROT, ALBUMIN in the last 168 hours. No results for input(s): LIPASE, AMYLASE in the last 168 hours. No results for input(s): AMMONIA in the last 168 hours. Coagulation Profile: Recent Labs  Lab 12/23/18 0748  INR 1.0   Cardiac Enzymes: No results for input(s):  CKTOTAL, CKMB, CKMBINDEX, TROPONINI in the last 168 hours. BNP (last 3 results) Recent Labs    10/15/18 1017  PROBNP 150.0*   HbA1C: No results for input(s): HGBA1C in the last 72 hours. CBG: No results for input(s): GLUCAP in the last 168 hours. Lipid Profile: No results for input(s): CHOL, HDL, LDLCALC, TRIG, CHOLHDL, LDLDIRECT in the last 72 hours. Thyroid Function Tests: No results for input(s): TSH, T4TOTAL, FREET4, T3FREE, THYROIDAB in the last 72 hours. Anemia Panel: No results for input(s): VITAMINB12, FOLATE, FERRITIN, TIBC, IRON, RETICCTPCT in the last 72 hours. Sepsis Labs: No results for input(s): PROCALCITON, LATICACIDVEN in the last 168 hours.  Recent Results (from the past 240 hour(s))  SARS Coronavirus 2 (CEPHEID - Performed in [facility] hospital lab), Hosp Order     Status: None   Collection Time: 12/23/18  9:36 AM  Result Value  Ref Range Status   SARS Coronavirus 2 NEGATIVE NEGATIVE Final    Comment: (NOTE) If result is NEGATIVE SARS-CoV-2 target nucleic acids are NOT DETECTED. The SARS-CoV-2 RNA is generally detectable in upper and lower  respiratory specimens during the acute phase of infection. The lowest  concentration of SARS-CoV-2 viral copies this assay can detect is 250  copies / mL. A negative result does not preclude SARS-CoV-2 infection  and should not be used as the sole basis for treatment or other  patient management decisions.  A negative result may occur with  improper specimen collection / handling, submission of specimen other  than nasopharyngeal swab, presence of viral mutation(s) within the  areas targeted by this assay, and inadequate number of viral copies  (<250 copies / mL). A negative result must be combined with clinical  observations, patient history, and epidemiological information. If result is POSITIVE SARS-CoV-2 target nucleic acids are DETECTED. The SARS-CoV-2 RNA is generally detectable in upper and lower  respiratory specimens dur ing the acute phase of infection.  Positive  results are indicative of active infection with SARS-CoV-2.  Clinical  correlation with patient history and other diagnostic information is  necessary to determine patient infection status.  Positive results do  not rule out bacterial infection or co-infection with other viruses. If result is PRESUMPTIVE POSTIVE SARS-CoV-2 nucleic acids MAY BE PRESENT.   A presumptive positive result was obtained on the submitted specimen  and confirmed on repeat testing.  While 2019 novel coronavirus  (SARS-CoV-2) nucleic acids may be present in the submitted sample  additional confirmatory testing may be necessary for epidemiological  and / or clinical management purposes  to differentiate between  SARS-CoV-2 and other Sarbecovirus currently known to infect humans.  If clinically indicated additional testing with an  alternate test  methodology [phone]) is advised. The SARS-CoV-2 RNA is generally  detectable in upper and lower respiratory sp ecimens during the acute  phase of infection. The expected result is Negative. Fact Sheet for Patients:  StrictlyIdeas.no Fact Sheet for Healthcare Providers: BankingDealers.co.za This test is not yet approved or cleared by the [place] FDA and has been authorized for detection and/or diagnosis of SARS-CoV-2 by FDA under an Emergency Use Authorization (EUA).  This EUA will remain in effect (meaning this test can be used) for the duration of the COVID-19 declaration under Section 564(b)(1) of the Act, 21 U.S.C. section 360bbb-3(b)(1), unless the authorization is terminated or revoked sooner. Performed at [facility], [place] [address], [place], [place] [address]   Surgical PCR screen     Status: Abnormal   Collection  Time: 12/23/18  7:58 PM  Result Value Ref Range Status   MRSA, PCR POSITIVE (A) NEGATIVE Final    Comment: RESULT CALLED TO, READ BACK BY AND VERIFIED WITH: M,STUTTON AT 0112 ON 12/24/18 BY A,MOHAMED    Staphylococcus aureus POSITIVE (A) NEGATIVE Final    Comment: (NOTE) The Xpert SA Assay (FDA approved for NASAL specimens in patients 6 years of age and older), is one component of a comprehensive surveillance program. It is not intended to diagnose infection nor to guide or monitor treatment. Performed at [facility] [place], [place] [address] [place], [place] [address]       Radiology Studies: Dg Elbow Complete Right  Result Date: 12/23/2018 CLINICAL DATA:  Right elbow pain after fall. EXAM: RIGHT ELBOW - COMPLETE 3+ VIEW COMPARISON:  None. FINDINGS: There is no evidence of fracture, dislocation, or joint effusion. There is no evidence of arthropathy or other focal bone abnormality. Soft tissues are unremarkable. IMPRESSION: Negative. Electronically Signed    By: Marijo Conception M.D.   On: 12/23/2018 09:26   Dg Chest Port 1 View  Result Date: 12/23/2018 CLINICAL DATA:  Fall, right hip fracture EXAM: PORTABLE CHEST 1 VIEW COMPARISON:  1919 FINDINGS: Left subclavian 2 lead pacer noted. Normal heart size and vascularity. Minor basilar atelectasis and slightly decreased lung volumes. No focal pneumonia, collapse or consolidation. Negative for edema, effusion or pneumothorax. Atherosclerosis noted. Trachea is midline. Bones are osteopenic. IMPRESSION: Basilar atelectasis. Atherosclerosis No acute chest process. Electronically Signed   By: Jerilynn Mages.  Shick M.D.   On: 12/23/2018 09:34   Dg Hip Unilat With Pelvis 2-3 Views Right  Result Date: 12/23/2018 CLINICAL DATA:  Right hip pain after fall today. EXAM: DG HIP (WITH OR WITHOUT PELVIS) 2-3V RIGHT COMPARISON:  None. FINDINGS: Severely comminuted and angulated fracture is seen involving the intertrochanteric region of the proximal right femur. Left hip appears normal. Vascular calcifications are noted. IMPRESSION: Severely comminuted and angulated intertrochanteric fracture of proximal right femur. Electronically Signed   By: Marijo Conception M.D.   On: 12/23/2018 09:25      LOS: 1 day   Time spent: More than 50% of that time was spent in counseling and/or coordination of care.  Antonieta Pert, MD [facility]  12/24/2018, 11:02 AM

## 2018-12-24 NOTE — Op Note (Signed)
DATE OF SURGERY:  12/24/2018  TIME: 1:31 PM  PATIENT NAME:  Jenna Marquez  AGE: 73 y.o.  PRE-OPERATIVE DIAGNOSIS:  Right intertrochanteric fracture  POST-OPERATIVE DIAGNOSIS:  SAME  PROCEDURE:  RIGHT INTRAMEDULLARY (IM) NAIL INTERTROCHANTRIC  SURGEON:  Renette Butters  ASSISTANT:  Roxan Hockey, PA-C, he was present and scrubbed throughout the case, critical for completion in a timely fashion, and for retraction, instrumentation, and closure.   OPERATIVE IMPLANTS: [employer]ker Gamma Nail  PREOPERATIVE INDICATIONS:  Jenna Marquez is a 73 y.o. year old who fell and suffered a hip fracture. She was brought into the ER and then admitted and optimized and then elected for surgical intervention.    The risks benefits and alternatives were discussed with the patient including but not limited to the risks of nonoperative treatment, versus surgical intervention including infection, bleeding, nerve injury, malunion, nonunion, hardware prominence, hardware failure, need for hardware removal, blood clots, cardiopulmonary complications, morbidity, mortality, among others, and they were willing to proceed.    OPERATIVE PROCEDURE:  The patient was brought to the operating room and placed in the supine position. General anesthesia was administered. She was placed on the fracture table.  Closed reduction was performed under C-arm guidance. Time out was then performed after sterile prep and drape. She received preoperative antibiotics.  Incision was made proximal to the greater trochanter. A guidewire was placed in the appropriate position. Confirmation was made on AP and lateral views. The above-named nail was opened. I opened the proximal femur with a reamer. I then placed the nail by hand easily down. I did not need to ream the femur.  Once the nail was completely seated, I placed a guidepin into the femoral head into the center center position. I measured the length, and then reamed the lateral  cortex and up into the head. I then placed the lag screw. Slight compression was applied. Anatomic fixation achieved. Bone quality was mediocre.  I then secured the proximal interlocking bolt, and took off a half a turn, and then removed the instruments, and took final C-arm pictures AP and lateral the entire length of the leg.  I then used perfect circles technique to place a distal interlock screw.   Anatomic reconstruction was achieved, and the wounds were irrigated copiously and closed with Vicryl followed by staples and sterile gauze for the skin. The patient was awakened and returned to PACU in stable and satisfactory condition. There no complications and the patient tolerated the procedure well.  She will be weightbearing as tolerated, and will be on chemical px  for a period of four weeks after discharge.   Edmonia Lynch, M.D.

## 2018-12-24 NOTE — H&P (View-Only) (Signed)
ORTHOPAEDIC CONSULTATION  REQUESTING PHYSICIAN: Antonieta Pert, MD  Chief Complaint: Fall, right hip pain.  HPI: Jenna Marquez is a 73 y.o. female who complains of right hip and knee pain after falling yesterday.  She notes falling a week ago as well.  She was unable to bear weight and presented to the emergency department where x-rays showed a displaced comminuted right intertrochanteric femur fracture.  Orthopedics was consulted for evaluation.  Today she is significant pain asking for pain medicine. Previously ambulatory without aid but did attempt using a walker after recent falls.  The patient was living with her niece.    Past Medical History:  Diagnosis Date  . Abnormal mammogram 07/20/2011  . Acute respiratory failure with hypoxia ([place]) 09/30/2016  . Alcohol abuse 02/19/2018  . ALLERGIC RHINITIS 10/13/2010   Qualifier: Diagnosis of  By: Ronnald Ramp CMA, [place]    . Anxiety    "occasionally"  . ASTHMA 10/13/2010   Qualifier: Diagnosis of  By: Ronnald Ramp CMA, Chemira    . Blunt trauma of nose 06/06/2012  . Chest pain 08/18/2011  . Chest pain on breathing 05/11/2015  . Chronic respiratory failure with hypoxia ([place]) 04/24/2018  . CKD (chronic kidney disease) stage 2, GFR 60-89 ml/min 09/30/2016  . COPD exacerbation ([place]) 08/16/2013  . E. coli UTI 02/22/2018  . Elevated BP 02/23/2012  . Generalized anxiety disorder 01/11/2012  . GERD (gastroesophageal reflux disease)   . Head injury 10/23/2012  . 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 Hemoptysis 02/19/2018  . 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 HTN (hypertension) 11/24/2016  . Indigestion 04/23/2012  . Left knee pain 07/11/2017  . Left shoulder pain 05/22/2015  . Lumbar radicular pain 06/13/2012  . MDD (major depressive disorder), single episode, severe , no psychosis ([place])   . Pacemaker 04/22/2015  . Palpitations 08/16/2013  . Paroxysmal atrial fibrillation ([place]) 09/30/2016  . Pneumonia 02/19/2018  . PVD (peripheral vascular disease) ([place]) 04/04/2017   . RBBB (right bundle branch block) 07/31/2011  . Swelling of arm 05/22/2015  . Swollen uvula 11/14/2014  . Tinnitus of both ears 10/23/2012  . Tobacco abuse    QUIT 12/13   . Vagina bleeding 02/19/2018  . Vitamin D deficiency 01/30/2013   Past Surgical History:  Procedure Laterality Date  . [place]  . EP IMPLANTABLE DEVICE N/A 01/07/2015   Procedure: Pacemaker Implant;  Surgeon: Evans Lance, MD;  Location: [facility];  Service: Cardiovascular;  Laterality: N/A;  . PACEMAKER INSERTION    . TONSILLECTOMY AND ADENOIDECTOMY  1954   Social History   Socioeconomic History  . Marital status: Widowed    Spouse name: Not on file  . Number of children: 1  . Years of education: Not on file  . Highest education level: Not on file  Occupational History  . Occupation: Retired    Fish farm manager: [employer]    Comment: Production manager  Social Needs  . Financial resource strain: Not on file  . Food insecurity:    Worry: Not on file    Inability: Not on file  . Transportation needs:    Medical: Not on file    Non-medical: Not on file  Tobacco Use  . Smoking status: Former Smoker    Packs/day: 1.00    Years: 35.00    Pack years: 35.00    Types: Cigarettes    Last attempt to quit: 08/10/2012    Years since quitting: 6.3  . Smokeless tobacco:  Never Used  . Tobacco comment: Started at age 33  Substance and Sexual Activity  . Alcohol use: Yes    Alcohol/week: 8.0 standard drinks    Types: 8 Glasses of wine per week  . Drug use: No  . Sexual activity: Not Currently    Birth control/protection: Post-menopausal  Lifestyle  . Physical activity:    Days per week: Not on file    Minutes per session: Not on file  . Stress: Not on file  Relationships  . Social connections:    Talks on phone: Not on file    Gets together: Not on file    Attends religious service: Not on file    Active member of club or organization: Not on file    Attends meetings of clubs or  organizations: Not on file    Relationship status: Not on file  Other Topics Concern  . Not on file  Social History Narrative   Lost husband in 12-2011, she lost her sister in 2012, brother in July 2013. She lives by herself, God son stayed with her. She retired as Radio producer.   Family History  Problem Relation Age of Onset  . Coronary artery disease Mother   . Heart disease Mother        CHF  . Congestive Heart Failure Mother   . Diabetes Neg Hx   . Cancer Neg Hx   . Stroke Neg Hx   . Colon cancer Neg Hx    Allergies  Allergen Reactions  . Avelox [Moxifloxacin Hcl In Nacl] Itching and Rash   Prior to Admission medications   Medication Sig Start Date End Date Taking? Authorizing Provider  acyclovir ointment (ZOVIRAX) 5 % Apply 1 application topically 3 (three) times daily. Patient taking differently: Apply 1 application topically daily as needed (for flares).  05/11/18  Yes Paz, Alda Berthold, MD  albuterol (PROAIR HFA) 108 (90 Base) MCG/ACT inhaler Inhale 1-2 puffs into the lungs every 6 (six) hours as needed for wheezing or shortness of breath. 12/21/17  Yes Parrett, Tammy S, NP  albuterol (PROVENTIL) (2.5 MG/3ML) 0.083% nebulizer solution Take 3 mLs (2.5 mg total) by nebulization every 6 (six) hours as needed for wheezing or shortness of breath. 04/24/18  Yes Parrett, Tammy S, NP  ALPRAZolam (XANAX) 0.5 MG tablet TAKE 1 TAB EVERY 8 HRS AS NEEDED FOR ANXIETY, DO NOT TAKE AT NIGHT WITH HYDROXYZINE OR WITH ALCOHOL Patient taking differently: Take 0.5 mg by mouth every 8 (eight) hours as needed for anxiety.  12/18/18  Yes Debbrah Alar, NP  budesonide-formoterol (SYMBICORT) 160-4.5 MCG/ACT inhaler Inhale 2 puffs into the lungs 2 (two) times daily. 10/27/17  Yes Parrett, Tammy S, NP  calcium-vitamin D (OSCAL WITH D) 250-125 MG-UNIT tablet Take 1 tablet by mouth daily.   Yes [provider]  Cholecalciferol (VITAMIN D) 2000 UNITS CAPS Take 1 capsule by mouth daily.   Yes [provider]  fluocinonide cream (LIDEX) 5.36 % Apply 1 application topically 2 (two) times daily.   Yes [provider]  ibuprofen (ADVIL,MOTRIN) 800 MG tablet Take 1 tablet (800 mg total) by mouth 3 (three) times daily. Use only on occasion for pain 05/23/18  Yes Copland, Jessica C, MD  ipratropium (ATROVENT) 0.02 % nebulizer solution INHALE 2.5ML BY NEBULIZATION EVERY 6 HOURS AS NEEDED FOR WHEEZING OR SHORTNESS OF BREATH. Patient taking differently: Take 0.5 mg by nebulization every 6 (six) hours as needed for wheezing or shortness of breath.  10/22/18  Yes  Parrett, Fonnie Mu, NP  methotrexate (RHEUMATREX) 2.5 MG tablet Take 12.5 mg by mouth every Monday.  05/01/18  Yes [provider]  metoprolol tartrate (LOPRESSOR) 25 MG tablet Take 1 tablet (25 mg total) by mouth 2 (two) times daily. 10/15/18 01/13/19 Yes Copland, Gay Filler, MD  traMADol (ULTRAM) 50 MG tablet Do not take with xanax or alcohol Patient taking differently: Take 50 mg by mouth every 6 (six) hours as needed for moderate pain or severe pain. Do not take with xanax or alcohol 12/18/18  Yes Debbrah Alar, NP  dicyclomine (BENTYL) 20 MG tablet Take 1 tablet (20 mg total) by mouth every 8 (eight) hours as needed for spasms (Abdominal cramping). Patient not taking: Reported on 12/23/2018 08/16/18   Ward, Delice Bison, DO  gabapentin (NEURONTIN) 100 MG capsule TAKE 1 CAPSULE BY MOUTH TWICE A DAY Patient not taking: Reported on 12/23/2018 06/26/18   Copland, Gay Filler, MD  predniSONE (DELTASONE) 10 MG tablet 4 tabs by mouth once daily for 2 days, then 3 tabs daily x 2 days, then 2 tabs daily x 2 days, then 1 tab daily x 2 days 12/18/18   Debbrah Alar, NP   Dg Elbow Complete Right  Result Date: 12/23/2018 CLINICAL DATA:  Right elbow pain after fall. EXAM: RIGHT ELBOW - COMPLETE 3+ VIEW COMPARISON:  None. FINDINGS: There is no evidence of fracture, dislocation, or joint effusion. There is no evidence of arthropathy or other  focal bone abnormality. Soft tissues are unremarkable. IMPRESSION: Negative. Electronically Signed   By: Marijo Conception M.D.   On: 12/23/2018 09:26   Dg Chest Port 1 View  Result Date: 12/23/2018 CLINICAL DATA:  Fall, right hip fracture EXAM: PORTABLE CHEST 1 VIEW COMPARISON:  1919 FINDINGS: Left subclavian 2 lead pacer noted. Normal heart size and vascularity. Minor basilar atelectasis and slightly decreased lung volumes. No focal pneumonia, collapse or consolidation. Negative for edema, effusion or pneumothorax. Atherosclerosis noted. Trachea is midline. Bones are osteopenic. IMPRESSION: Basilar atelectasis. Atherosclerosis No acute chest process. Electronically Signed   By: Jerilynn Mages.  Shick M.D.   On: 12/23/2018 09:34   Dg Hip Unilat With Pelvis 2-3 Views Right  Result Date: 12/23/2018 CLINICAL DATA:  Right hip pain after fall today. EXAM: DG HIP (WITH OR WITHOUT PELVIS) 2-3V RIGHT COMPARISON:  None. FINDINGS: Severely comminuted and angulated fracture is seen involving the intertrochanteric region of the proximal right femur. Left hip appears normal. Vascular calcifications are noted. IMPRESSION: Severely comminuted and angulated intertrochanteric fracture of proximal right femur. Electronically Signed   By: Marijo Conception M.D.   On: 12/23/2018 09:25    Positive ROS: All other systems have been reviewed and were otherwise negative with the exception of those mentioned in the HPI and as above.  Objective: Labs cbc Recent Labs    12/23/18 0748 12/23/18 1425  WBC 8.3 9.5  HGB 12.4 11.0*  HCT 37.3 33.7*  PLT 217 198    Labs inflam No results for input(s): CRP in the last 72 hours.  Invalid input(s): ESR  Labs coag Recent Labs    12/23/18 0748  INR 1.0    Recent Labs    12/23/18 0748 12/23/18 1425 12/24/18 0355  NA 139  --  139  K 5.1  --  4.2  CL 101  --  106  CO2 26  --  25  GLUCOSE 139*  --  128*  BUN 14  --  18  CREATININE 1.10* 0.92 0.91  CALCIUM 8.7*  --  7.7*     Physical Exam: Vitals:   12/23/18 2121 12/24/18 0617  BP: 112/78 118/61  Pulse: 96 92  Resp: 16 14  Temp: 98.9 F (37.2 C) 98.4 F (36.9 C)  SpO2: 91% 97%   General: Alert, uncomfortable with movement but in no acute distress.  Mental status: Alert and Oriented x3 Neurologic: Speech Clear and organized, no gross focal findings or movement disorder appreciated. Respiratory: No cyanosis, no use of accessory musculature Cardiovascular: No pedal edema GI: Abdomen is soft and non-tender, non-distended. Skin: Warm and dry.  No lesions in the area of chief complaint. Extremities: Warm and well perfused w/o edema Psychiatric: Patient is competent for consent with normal mood and affect  MUSCULOSKELETAL:  RLE: Pain with range of motion of the hip and knee.  No lesions or sign of infection.  Significant effusion in the knee which is diffusely tender.  Dorsiflexion, plantarflexion, EHL, FHL intact distally.  Feet warm without edema. Other extremities are atraumatic with painless ROM and NVI.  Assessment / Plan: Principal Problem:   Closed displaced intertrochanteric fracture of right femur (HCC) Active Problems:   COPD (chronic obstructive pulmonary disease) gold stage C.   Tobacco abuse   Generalized anxiety disorder   Pacemaker   CKD (chronic kidney disease) stage 3, GFR 30-59 ml/min (HCC)   Paroxysmal atrial fibrillation (HCC)   Mobitz type 2 second degree AV block   HTN (hypertension)   Alcohol abuse   MDD (major depressive disorder), single episode, severe , no psychosis ([place])   Chronic respiratory failure with hypoxia (HCC)   Closed right hip fracture ([place])   Open right hip fracture (HCC)   Closed right intertrochanteric femur fracture Plan for Operative fixation this afternoon by Dr. Percell Miller -NPO  -PT/OT post op -Smart wick Foley prn for comfort  -Likely to require Rehab or SNF placement upon discharge.   The risks benefits and alternatives of the procedure were  discussed with the patient including but not limited to infection, bleeding, nerve injury, the need for revision surgery, blood clots, cardiopulmonary complications, morbidity, mortality, among others.  The patient verbalizes understanding and wishes to proceed.     Right knee pain/effusion Obtain x-ray. NWB RLE  Weightbearing: Bedrest.  Will amend post op. VTE prophylaxis:  SCDs.  We will will amend postoperatively.  Likely Lovenox for 30 days. Pain control: Continue current regimen.  Minimize narcotics if able. Follow-up plan: Will follow in acute inpatient post-op phase.  Plan for outpatient follow up with Dr. Alain Marion in about 2 weeks. Contact information:  Edmonia Lynch MD, Roxan Hockey PA-C  Prudencio Burly III PA-C 12/24/2018 7:06 AM

## 2018-12-24 NOTE — Anesthesia Preprocedure Evaluation (Addendum)
Anesthesia Evaluation  Patient identified by MRN, date of birth, ID band Patient awake    Reviewed: Allergy & Precautions, NPO status , Patient's Chart, lab work & pertinent test results  Airway Mallampati: II  TM Distance: >3 FB Neck ROM: Full    Dental  (+) Teeth Intact, Dental Advisory Given   Pulmonary COPD,  COPD inhaler, former smoker,    Pulmonary exam normal breath sounds clear to auscultation       Cardiovascular hypertension, Pt. on medications and Pt. on home beta blockers + Peripheral Vascular Disease  Normal cardiovascular exam+ dysrhythmias Atrial Fibrillation + pacemaker  Rhythm:Regular Rate:Normal  Echo 12/23/18 1. The left ventricle has normal systolic function, with an ejection fraction of 60-65%. The cavity size was normal. Left ventricular diastolic function could not be evaluated.  2. The right ventricle has normal systolc function. The cavity was normal. There is no increase in right ventricular wall thickness. Right ventricular systolic pressure is moderately elevated with an estimated pressure of 40.9 mmHg.   Neuro/Psych PSYCHIATRIC DISORDERS Depression  Neuromuscular disease    GI/Hepatic GERD  ,(+)     substance abuse  alcohol use,   Endo/Other    Renal/GU CRFRenal diseaseK+ 4.2 Cr 0.91     Musculoskeletal Right intertrochanteric fracture   Abdominal   Peds  Hematology  (+) Blood dyscrasia, anemia , Plt 198k Hgb 11.0   Anesthesia Other Findings   Reproductive/Obstetrics                           Anesthesia Physical Anesthesia Plan  ASA: III  Anesthesia Plan: Spinal   Post-op Pain Management:    Induction:   PONV Risk Score and Plan: 2 and Propofol infusion, Treatment may vary due to age or medical condition and Ondansetron  Airway Management Planned: Natural Airway and Simple Face Mask  Additional Equipment:   Intra-op Plan:   Post-operative Plan:    Informed Consent: I have reviewed the patients History and Physical, chart, labs and discussed the procedure including the risks, benefits and alternatives for the proposed anesthesia with the patient or authorized representative who has indicated his/her understanding and acceptance.   Patient has DNR.  Continue DNR.   Dental advisory given  Plan Discussed with: CRNA, Anesthesiologist and Surgeon  Anesthesia Plan Comments: (Discussed risks and benefits of and differences between spinal and general. Discussed risks of spinal including headache, backache, failure, bleeding, infection, and nerve damage. Patient consents to spinal. Questions answered. Coagulation studies and platelet count acceptable.)        Anesthesia Quick Evaluation

## 2018-12-24 NOTE — Anesthesia Postprocedure Evaluation (Signed)
Anesthesia Post Note  Patient: Jenna Marquez  Procedure(s) Performed: RIGHT INTRAMEDULLARY (IM) NAIL INTERTROCHANTRIC (Right )     Patient location during evaluation: PACU Anesthesia Type: Spinal Level of consciousness: oriented and awake and alert Pain management: pain level controlled Vital Signs Assessment: post-procedure vital signs reviewed and stable Respiratory status: spontaneous breathing, respiratory function stable, patient connected to nasal cannula oxygen and nonlabored ventilation Cardiovascular status: blood pressure returned to baseline and stable Postop Assessment: no headache, no backache, no apparent nausea or vomiting and spinal receding Anesthetic complications: no    Last Vitals:  Vitals:   12/24/18 1515 12/24/18 1530  BP: 97/66 111/61  Pulse: 70 71  Resp: 16 17  Temp:  36.6 C  SpO2: 100% 100%    Last Pain:  Vitals:   12/24/18 1530  TempSrc:   PainSc: 0-No pain    LLE Motor Response: Purposeful movement (12/24/18 1530) LLE Sensation: Decreased (12/24/18 1530) RLE Motor Response: Purposeful movement (12/24/18 1530) RLE Sensation: Decreased (12/24/18 1530) L Sensory Level: L3-Anterior knee, lower leg (12/24/18 1530) R Sensory Level: L3-Anterior knee, lower leg (12/24/18 1530)  Catalina Gravel

## 2018-12-24 NOTE — Transfer of Care (Signed)
Immediate Anesthesia Transfer of Care Note  Patient: Jenna Marquez  Procedure(s) Performed: RIGHT INTRAMEDULLARY (IM) NAIL INTERTROCHANTRIC (Right )  Patient Location: PACU  Anesthesia Type:MAC and Spinal  Level of Consciousness: awake, alert , oriented and patient cooperative  Airway & Oxygen Therapy: Patient Spontanous Breathing and Patient connected to face mask oxygen  Post-op Assessment: Report given to RN, Post -op Vital signs reviewed and stable and Patient moving all extremities  Post vital signs: Reviewed and stable  Last Vitals:  Vitals Value Taken Time  BP 92/61 12/24/2018  2:54 PM  Temp    Pulse 82 12/24/2018  2:54 PM  Resp 15 12/24/2018  2:58 PM  SpO2    Vitals shown include unvalidated device data.  Last Pain:  Vitals:   12/24/18 1241  TempSrc: Oral  PainSc:       Patients Stated Pain Goal: 2 (37/04/88 8916)  Complications: No apparent anesthesia complications

## 2018-12-24 NOTE — Anesthesia Procedure Notes (Signed)
Spinal  Patient location during procedure: OR Start time: 12/24/2018 1:31 PM End time: 12/24/2018 1:34 PM Staffing Anesthesiologist: Catalina Gravel, MD Performed: anesthesiologist  Preanesthetic Checklist Completed: patient identified, surgical consent, pre-op evaluation, timeout performed, IV checked, risks and benefits discussed and monitors and equipment checked Spinal Block Patient position: right lateral decubitus Prep: site prepped and draped and DuraPrep Patient monitoring: continuous pulse ox and blood pressure Approach: midline Location: L3-4 Injection technique: single-shot Needle Needle type: Pencan  Needle gauge: 24 G Assessment Sensory level: T8 Additional Notes Functioning IV was confirmed and monitors were applied. Sterile prep and drape, including hand hygiene, mask and sterile gloves were used. The patient was positioned and the spine was prepped. The skin was anesthetized with lidocaine.  Free flow of clear CSF was obtained prior to injecting local anesthetic into the CSF.  The spinal needle aspirated freely following injection.  The needle was carefully withdrawn.  The patient tolerated the procedure well. Consent was obtained prior to procedure with all questions answered and concerns addressed. Risks including but not limited to bleeding, infection, nerve damage, paralysis, failed block, inadequate analgesia, allergic reaction, high spinal, itching and headache were discussed and the patient wished to proceed.   Hoy Morn, MD

## 2018-12-24 NOTE — Progress Notes (Addendum)
Patient refuses the ted hose, states she's allergic to them and she breaks out in a rash. Patient is repositioned q 2 hr. Ice applied to surgical site.

## 2018-12-24 NOTE — Consult Note (Addendum)
ORTHOPAEDIC CONSULTATION  REQUESTING PHYSICIAN: Antonieta Pert, MD  Chief Complaint: Fall, right hip pain.  HPI: Jenna Marquez is a 73 y.o. female who complains of right hip and knee pain after falling yesterday.  She notes falling a week ago as well.  She was unable to bear weight and presented to the emergency department where x-rays showed a displaced comminuted right intertrochanteric femur fracture.  Orthopedics was consulted for evaluation.  Today she is significant pain asking for pain medicine. Previously ambulatory without aid but did attempt using a walker after recent falls.  The patient was living with her niece.    Past Medical History:  Diagnosis Date  . Abnormal mammogram 07/20/2011  . Acute respiratory failure with hypoxia ([place]) 09/30/2016  . Alcohol abuse 02/19/2018  . ALLERGIC RHINITIS 10/13/2010   Qualifier: Diagnosis of  By: Ronnald Ramp CMA, [place]    . Anxiety    "occasionally"  . ASTHMA 10/13/2010   Qualifier: Diagnosis of  By: Ronnald Ramp CMA, Chemira    . Blunt trauma of nose 06/06/2012  . Chest pain 08/18/2011  . Chest pain on breathing 05/11/2015  . Chronic respiratory failure with hypoxia ([place]) 04/24/2018  . CKD (chronic kidney disease) stage 2, GFR 60-89 ml/min 09/30/2016  . COPD exacerbation ([place]) 08/16/2013  . E. coli UTI 02/22/2018  . Elevated BP 02/23/2012  . Generalized anxiety disorder 01/11/2012  . GERD (gastroesophageal reflux disease)   . Head injury 10/23/2012  . 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 Hemoptysis 02/19/2018  . 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 HTN (hypertension) 11/24/2016  . Indigestion 04/23/2012  . Left knee pain 07/11/2017  . Left shoulder pain 05/22/2015  . Lumbar radicular pain 06/13/2012  . MDD (major depressive disorder), single episode, severe , no psychosis ([place])   . Pacemaker 04/22/2015  . Palpitations 08/16/2013  . Paroxysmal atrial fibrillation ([place]) 09/30/2016  . Pneumonia 02/19/2018  . PVD (peripheral vascular disease) ([place]) 04/04/2017   . RBBB (right bundle branch block) 07/31/2011  . Swelling of arm 05/22/2015  . Swollen uvula 11/14/2014  . Tinnitus of both ears 10/23/2012  . Tobacco abuse    QUIT 12/13   . Vagina bleeding 02/19/2018  . Vitamin D deficiency 01/30/2013   Past Surgical History:  Procedure Laterality Date  . [place]  . EP IMPLANTABLE DEVICE N/A 01/07/2015   Procedure: Pacemaker Implant;  Surgeon: Evans Lance, MD;  Location: [facility];  Service: Cardiovascular;  Laterality: N/A;  . PACEMAKER INSERTION    . TONSILLECTOMY AND ADENOIDECTOMY  1954   Social History   Socioeconomic History  . Marital status: Widowed    Spouse name: Not on file  . Number of children: 1  . Years of education: Not on file  . Highest education level: Not on file  Occupational History  . Occupation: Retired    Fish farm manager: [employer]    Comment: Production manager  Social Needs  . Financial resource strain: Not on file  . Food insecurity:    Worry: Not on file    Inability: Not on file  . Transportation needs:    Medical: Not on file    Non-medical: Not on file  Tobacco Use  . Smoking status: Former Smoker    Packs/day: 1.00    Years: 35.00    Pack years: 35.00    Types: Cigarettes    Last attempt to quit: 08/10/2012    Years since quitting: 6.3  . Smokeless tobacco:  Never Used  . Tobacco comment: Started at age 33  Substance and Sexual Activity  . Alcohol use: Yes    Alcohol/week: 8.0 standard drinks    Types: 8 Glasses of wine per week  . Drug use: No  . Sexual activity: Not Currently    Birth control/protection: Post-menopausal  Lifestyle  . Physical activity:    Days per week: Not on file    Minutes per session: Not on file  . Stress: Not on file  Relationships  . Social connections:    Talks on phone: Not on file    Gets together: Not on file    Attends religious service: Not on file    Active member of club or organization: Not on file    Attends meetings of clubs or  organizations: Not on file    Relationship status: Not on file  Other Topics Concern  . Not on file  Social History Narrative   Lost husband in 12-2011, she lost her sister in 2012, brother in July 2013. She lives by herself, God son stayed with her. She retired as Radio producer.   Family History  Problem Relation Age of Onset  . Coronary artery disease Mother   . Heart disease Mother        CHF  . Congestive Heart Failure Mother   . Diabetes Neg Hx   . Cancer Neg Hx   . Stroke Neg Hx   . Colon cancer Neg Hx    Allergies  Allergen Reactions  . Avelox [Moxifloxacin Hcl In Nacl] Itching and Rash   Prior to Admission medications   Medication Sig Start Date End Date Taking? Authorizing Provider  acyclovir ointment (ZOVIRAX) 5 % Apply 1 application topically 3 (three) times daily. Patient taking differently: Apply 1 application topically daily as needed (for flares).  05/11/18  Yes Paz, Alda Berthold, MD  albuterol (PROAIR HFA) 108 (90 Base) MCG/ACT inhaler Inhale 1-2 puffs into the lungs every 6 (six) hours as needed for wheezing or shortness of breath. 12/21/17  Yes Parrett, Tammy S, NP  albuterol (PROVENTIL) (2.5 MG/3ML) 0.083% nebulizer solution Take 3 mLs (2.5 mg total) by nebulization every 6 (six) hours as needed for wheezing or shortness of breath. 04/24/18  Yes Parrett, Tammy S, NP  ALPRAZolam (XANAX) 0.5 MG tablet TAKE 1 TAB EVERY 8 HRS AS NEEDED FOR ANXIETY, DO NOT TAKE AT NIGHT WITH HYDROXYZINE OR WITH ALCOHOL Patient taking differently: Take 0.5 mg by mouth every 8 (eight) hours as needed for anxiety.  12/18/18  Yes Debbrah Alar, NP  budesonide-formoterol (SYMBICORT) 160-4.5 MCG/ACT inhaler Inhale 2 puffs into the lungs 2 (two) times daily. 10/27/17  Yes Parrett, Tammy S, NP  calcium-vitamin D (OSCAL WITH D) 250-125 MG-UNIT tablet Take 1 tablet by mouth daily.   Yes [provider]  Cholecalciferol (VITAMIN D) 2000 UNITS CAPS Take 1 capsule by mouth daily.   Yes [provider]  fluocinonide cream (LIDEX) 5.36 % Apply 1 application topically 2 (two) times daily.   Yes [provider]  ibuprofen (ADVIL,MOTRIN) 800 MG tablet Take 1 tablet (800 mg total) by mouth 3 (three) times daily. Use only on occasion for pain 05/23/18  Yes Copland, Jessica C, MD  ipratropium (ATROVENT) 0.02 % nebulizer solution INHALE 2.5ML BY NEBULIZATION EVERY 6 HOURS AS NEEDED FOR WHEEZING OR SHORTNESS OF BREATH. Patient taking differently: Take 0.5 mg by nebulization every 6 (six) hours as needed for wheezing or shortness of breath.  10/22/18  Yes  Parrett, Fonnie Mu, NP  methotrexate (RHEUMATREX) 2.5 MG tablet Take 12.5 mg by mouth every Monday.  05/01/18  Yes [provider]  metoprolol tartrate (LOPRESSOR) 25 MG tablet Take 1 tablet (25 mg total) by mouth 2 (two) times daily. 10/15/18 01/13/19 Yes Copland, Gay Filler, MD  traMADol (ULTRAM) 50 MG tablet Do not take with xanax or alcohol Patient taking differently: Take 50 mg by mouth every 6 (six) hours as needed for moderate pain or severe pain. Do not take with xanax or alcohol 12/18/18  Yes Debbrah Alar, NP  dicyclomine (BENTYL) 20 MG tablet Take 1 tablet (20 mg total) by mouth every 8 (eight) hours as needed for spasms (Abdominal cramping). Patient not taking: Reported on 12/23/2018 08/16/18   Ward, Delice Bison, DO  gabapentin (NEURONTIN) 100 MG capsule TAKE 1 CAPSULE BY MOUTH TWICE A DAY Patient not taking: Reported on 12/23/2018 06/26/18   Copland, Gay Filler, MD  predniSONE (DELTASONE) 10 MG tablet 4 tabs by mouth once daily for 2 days, then 3 tabs daily x 2 days, then 2 tabs daily x 2 days, then 1 tab daily x 2 days 12/18/18   Debbrah Alar, NP   Dg Elbow Complete Right  Result Date: 12/23/2018 CLINICAL DATA:  Right elbow pain after fall. EXAM: RIGHT ELBOW - COMPLETE 3+ VIEW COMPARISON:  None. FINDINGS: There is no evidence of fracture, dislocation, or joint effusion. There is no evidence of arthropathy or other  focal bone abnormality. Soft tissues are unremarkable. IMPRESSION: Negative. Electronically Signed   By: Marijo Conception M.D.   On: 12/23/2018 09:26   Dg Chest Port 1 View  Result Date: 12/23/2018 CLINICAL DATA:  Fall, right hip fracture EXAM: PORTABLE CHEST 1 VIEW COMPARISON:  1919 FINDINGS: Left subclavian 2 lead pacer noted. Normal heart size and vascularity. Minor basilar atelectasis and slightly decreased lung volumes. No focal pneumonia, collapse or consolidation. Negative for edema, effusion or pneumothorax. Atherosclerosis noted. Trachea is midline. Bones are osteopenic. IMPRESSION: Basilar atelectasis. Atherosclerosis No acute chest process. Electronically Signed   By: Jerilynn Mages.  Shick M.D.   On: 12/23/2018 09:34   Dg Hip Unilat With Pelvis 2-3 Views Right  Result Date: 12/23/2018 CLINICAL DATA:  Right hip pain after fall today. EXAM: DG HIP (WITH OR WITHOUT PELVIS) 2-3V RIGHT COMPARISON:  None. FINDINGS: Severely comminuted and angulated fracture is seen involving the intertrochanteric region of the proximal right femur. Left hip appears normal. Vascular calcifications are noted. IMPRESSION: Severely comminuted and angulated intertrochanteric fracture of proximal right femur. Electronically Signed   By: Marijo Conception M.D.   On: 12/23/2018 09:25    Positive ROS: All other systems have been reviewed and were otherwise negative with the exception of those mentioned in the HPI and as above.  Objective: Labs cbc Recent Labs    12/23/18 0748 12/23/18 1425  WBC 8.3 9.5  HGB 12.4 11.0*  HCT 37.3 33.7*  PLT 217 198    Labs inflam No results for input(s): CRP in the last 72 hours.  Invalid input(s): ESR  Labs coag Recent Labs    12/23/18 0748  INR 1.0    Recent Labs    12/23/18 0748 12/23/18 1425 12/24/18 0355  NA 139  --  139  K 5.1  --  4.2  CL 101  --  106  CO2 26  --  25  GLUCOSE 139*  --  128*  BUN 14  --  18  CREATININE 1.10* 0.92 0.91  CALCIUM 8.7*  --  7.7*     Physical Exam: Vitals:   12/23/18 2121 12/24/18 0617  BP: 112/78 118/61  Pulse: 96 92  Resp: 16 14  Temp: 98.9 F (37.2 C) 98.4 F (36.9 C)  SpO2: 91% 97%   General: Alert, uncomfortable with movement but in no acute distress.  Mental status: Alert and Oriented x3 Neurologic: Speech Clear and organized, no gross focal findings or movement disorder appreciated. Respiratory: No cyanosis, no use of accessory musculature Cardiovascular: No pedal edema GI: Abdomen is soft and non-tender, non-distended. Skin: Warm and dry.  No lesions in the area of chief complaint. Extremities: Warm and well perfused w/o edema Psychiatric: Patient is competent for consent with normal mood and affect  MUSCULOSKELETAL:  RLE: Pain with range of motion of the hip and knee.  No lesions or sign of infection.  Significant effusion in the knee which is diffusely tender.  Dorsiflexion, plantarflexion, EHL, FHL intact distally.  Feet warm without edema. Other extremities are atraumatic with painless ROM and NVI.  Assessment / Plan: Principal Problem:   Closed displaced intertrochanteric fracture of right femur (HCC) Active Problems:   COPD (chronic obstructive pulmonary disease) gold stage C.   Tobacco abuse   Generalized anxiety disorder   Pacemaker   CKD (chronic kidney disease) stage 3, GFR 30-59 ml/min (HCC)   Paroxysmal atrial fibrillation (HCC)   Mobitz type 2 second degree AV block   HTN (hypertension)   Alcohol abuse   MDD (major depressive disorder), single episode, severe , no psychosis ([place])   Chronic respiratory failure with hypoxia (HCC)   Closed right hip fracture ([place])   Open right hip fracture (HCC)   Closed right intertrochanteric femur fracture Plan for Operative fixation this afternoon by Dr. Percell Miller -NPO  -PT/OT post op -Smart wick Foley prn for comfort  -Likely to require Rehab or SNF placement upon discharge.   The risks benefits and alternatives of the procedure were  discussed with the patient including but not limited to infection, bleeding, nerve injury, the need for revision surgery, blood clots, cardiopulmonary complications, morbidity, mortality, among others.  The patient verbalizes understanding and wishes to proceed.     Right knee pain/effusion Obtain x-ray. NWB RLE  Weightbearing: Bedrest.  Will amend post op. VTE prophylaxis:  SCDs.  We will will amend postoperatively.  Likely Lovenox for 30 days. Pain control: Continue current regimen.  Minimize narcotics if able. Follow-up plan: Will follow in acute inpatient post-op phase.  Plan for outpatient follow up with Dr. Alain Marion in about 2 weeks. Contact information:  Edmonia Lynch MD, Roxan Hockey PA-C  Prudencio Burly III PA-C 12/24/2018 7:06 AM

## 2018-12-24 NOTE — Interval H&P Note (Signed)
I participated in the care of this patient and agree with the above history, physical and evaluation. I performed a review of the history and a physical exam as detailed   Tonae Livolsi Daniel Rasheida Broden MD  

## 2018-12-25 ENCOUNTER — Encounter (HOSPITAL_COMMUNITY): Payer: Self-pay | Admitting: Orthopedic Surgery

## 2018-12-25 DIAGNOSIS — D62 Acute posthemorrhagic anemia: Secondary | ICD-10-CM

## 2018-12-25 LAB — HEMOGLOBIN AND HEMATOCRIT, BLOOD
HCT: 20.3 % — ABNORMAL LOW (ref 36.0–46.0)
HCT: 27.1 % — ABNORMAL LOW (ref 36.0–46.0)
Hemoglobin: 6.5 g/dL — CL (ref 12.0–15.0)
Hemoglobin: 9.1 g/dL — ABNORMAL LOW (ref 12.0–15.0)

## 2018-12-25 LAB — HEPATIC FUNCTION PANEL
ALT: 15 U/L (ref 0–44)
AST: 29 U/L (ref 15–41)
Albumin: 1.9 g/dL — ABNORMAL LOW (ref 3.5–5.0)
Alkaline Phosphatase: 58 U/L (ref 38–126)
Bilirubin, Direct: 0.2 mg/dL (ref 0.0–0.2)
Indirect Bilirubin: 0.2 mg/dL — ABNORMAL LOW (ref 0.3–0.9)
Total Bilirubin: 0.4 mg/dL (ref 0.3–1.2)
Total Protein: 4.1 g/dL — ABNORMAL LOW (ref 6.5–8.1)

## 2018-12-25 LAB — PREPARE RBC (CROSSMATCH)

## 2018-12-25 LAB — BASIC METABOLIC PANEL
Anion gap: 5 (ref 5–15)
BUN: 13 mg/dL (ref 8–23)
CO2: 18 mmol/L — ABNORMAL LOW (ref 22–32)
Calcium: 6 mg/dL — CL (ref 8.9–10.3)
Chloride: 117 mmol/L — ABNORMAL HIGH (ref 98–111)
Creatinine, Ser: 0.65 mg/dL (ref 0.44–1.00)
GFR calc Af Amer: >60 mL/min (ref >60)
GFR calc non Af Amer: >60 mL/min (ref >60)
Glucose, Bld: 110 mg/dL — ABNORMAL HIGH (ref 70–99)
Potassium: 3.8 mmol/L (ref 3.5–5.1)
Sodium: 140 mmol/L (ref 135–145)

## 2018-12-25 LAB — CBC
HCT: 21.1 % — ABNORMAL LOW (ref 36.0–46.0)
Hemoglobin: 6.4 g/dL — CL (ref 12.0–15.0)
MCH: 36.8 pg — ABNORMAL HIGH (ref 26.0–34.0)
MCHC: 30.3 g/dL (ref 30.0–36.0)
MCV: 121.3 fL — ABNORMAL HIGH (ref 80.0–100.0)
Platelets: 149 10*3/uL — ABNORMAL LOW (ref 150–400)
RBC: 1.74 MIL/uL — ABNORMAL LOW (ref 3.87–5.11)
RDW: 13.2 % (ref 11.5–15.5)
WBC: 10 10*3/uL (ref 4.0–10.5)
nRBC: 0 % (ref 0.0–0.2)

## 2018-12-25 LAB — MAGNESIUM: Magnesium: 1.7 mg/dL (ref 1.7–2.4)

## 2018-12-25 MED ORDER — CALCIUM CARBONATE 1250 (500 CA) MG PO TABS
1.0000 | ORAL_TABLET | Freq: Two times a day (BID) | ORAL | Status: DC
  Administered 2018-12-25 – 2019-01-02 (×14): 500 mg via ORAL
  Filled 2018-12-25 (×16): qty 1

## 2018-12-25 MED ORDER — FOLIC ACID 1 MG PO TABS
1.0000 mg | ORAL_TABLET | Freq: Every day | ORAL | Status: DC
  Administered 2018-12-25 – 2019-01-01 (×8): 1 mg via ORAL
  Filled 2018-12-25 (×8): qty 1

## 2018-12-25 MED ORDER — VITAMIN B-1 100 MG PO TABS
100.0000 mg | ORAL_TABLET | Freq: Every day | ORAL | Status: DC
  Administered 2018-12-26 – 2019-01-01 (×7): 100 mg via ORAL
  Filled 2018-12-25 (×7): qty 1

## 2018-12-25 MED ORDER — SODIUM CHLORIDE 0.9% IV SOLUTION: Freq: Once | INTRAVENOUS | Status: DC

## 2018-12-25 MED ORDER — CHLORHEXIDINE GLUCONATE CLOTH 2 % EX PADS
6.0000 | MEDICATED_PAD | Freq: Every day | CUTANEOUS | Status: DC
  Administered 2018-12-25: 6 via TOPICAL

## 2018-12-25 NOTE — Progress Notes (Signed)
CRITICAL VALUE ALERT  Critical Value:  Hgb 6.4  Date & Time Notied:  12/25/18  02:27  Provider Notified: Baltazar Najjar, NP  Orders Received/Actions taken: Orders to transfuse 1 unit PRBC received

## 2018-12-25 NOTE — Progress Notes (Signed)
Rehab Admissions Coordinator Note:  Per PT recommendation, this patient was screened by Jhonnie Garner for appropriateness for an Inpatient Acute Rehab Consult.  At this time, it does not appear that the pt can tolerate the intensity of CIR due to pain level. Anticipate she will need a longer term, slower paced rehab setting such as a SNF. Please contact if pt shows improved tolerance for therapies.   Jhonnie Garner 12/25/2018, 1:50 PM  I can be reached at [phone].

## 2018-12-25 NOTE — Evaluation (Signed)
Physical Therapy Evaluation Patient Details Name: Jenna Marquez MRN: [id number] DOB: 08-26-1945 Today's Date: 12/25/2018   History of Present Illness  73 y.o. BF PMHx anxiety, major depressive disorder, COPD, chronic respiratory failure with hypoxia, tobacco abuse, EtOH abuse, high-grade AV block S/P pacemaker, paroxysmal atrial fibrillation not on blood thinners), RBBB, CKD stage III who was ambulatory at baseline living with her niece admitted after a fall while trying to transfer to chair. Dx of R IT fracture of femur, s/p IM nail, possible R partial quad tendon rupture  Clinical Impression  Pt admitted with above diagnosis. Pt currently with functional limitations due to the deficits listed below (see PT Problem List). +2 total assist for supine to sit and sit to stand. Pt unable to tolerate standing more than ~15 seconds 2* pain.  Pt will benefit from skilled PT to increase their independence and safety with mobility to allow discharge to the venue listed below.       Follow Up Recommendations SNF;Supervision/Assistance - 24 hour;Supervision for mobility/OOB;CIR    Equipment Recommendations  Rolling walker with 5" wheels;3in1 (PT)    Recommendations for Other Services       Precautions / Restrictions Precautions Precautions: Fall Restrictions Weight Bearing Restrictions: No Other Position/Activity Restrictions: WBAT      Mobility  Bed Mobility Overal bed mobility: Needs Assistance Bed Mobility: Supine to Sit;Sit to Supine     Supine to sit: +2 for physical assistance;Total assist Sit to supine: Total assist;+2 for physical assistance   General bed mobility comments: pt 25%, assist to raise trunk and advance BLEs  Transfers Overall transfer level: Needs assistance Equipment used: Rolling walker (2 wheeled) Transfers: Sit to/from Stand Sit to Stand: +2 physical assistance;Max assist         General transfer comment: sit to stand x 2 trials, +2 max assist to power  up, VCs for technique and posture, pt stood with flexed posture, stood ~15 seconds -duration limited by pain  Ambulation/Gait                Stairs            Wheelchair Mobility    Modified Rankin (Stroke Patients Only)       Balance Overall balance assessment: Needs assistance Sitting-balance support: Feet supported;Bilateral upper extremity supported Sitting balance-Leahy Scale: Poor Sitting balance - Comments: posterior lean initially     Standing balance-Leahy Scale: Zero                               Pertinent Vitals/Pain Pain Assessment: 0-10 Pain Score: 9  Pain Location: RLE with movement Pain Descriptors / Indicators: Sharp;Sore;Guarding Pain Intervention(s): Limited activity within patient's tolerance;Monitored during session;Premedicated before session;RN gave pain meds during session    Home Living Family/patient expects to be discharged to:: Private residence Living Arrangements: Other (Comment);Non-relatives/Friends(god-daughter)   Type of Home: House Home Access: Stairs to enter   [employer] of Steps: 1 Home Layout: One level        Prior Function Level of Independence: Independent         Comments: walks without AD     Hand Dominance        Extremity/Trunk Assessment   Upper Extremity Assessment Upper Extremity Assessment: Overall WFL for tasks assessed    Lower Extremity Assessment Lower Extremity Assessment: RLE deficits/detail RLE Deficits / Details: hip -2/5 limited by pain, knee NT 2* order for KI at all times,  ankle WNL RLE: Unable to fully assess due to immobilization;Unable to fully assess due to pain RLE Sensation: WNL    Cervical / Trunk Assessment Cervical / Trunk Assessment: Normal  Communication   Communication: No difficulties  Cognition Arousal/Alertness: Awake/alert Behavior During Therapy: WFL for tasks assessed/performed Overall Cognitive Status: Within Functional Limits  for tasks assessed                                        General Comments      Exercises General Exercises - Lower Extremity Ankle Circles/Pumps: AROM;Both;10 reps;Supine Hip ABduction/ADduction: AAROM;Right;10 reps;Supine   Assessment/Plan    PT Assessment Patient needs continued PT services  PT Problem List Decreased strength;Decreased activity tolerance;Decreased mobility;Pain;Decreased balance;Decreased knowledge of use of DME       PT Treatment Interventions DME instruction;Gait training;Therapeutic activities;Therapeutic exercise;Functional mobility training;Balance training;Patient/family education    PT Goals (Current goals can be found in the Care Plan section)  Acute Rehab PT Goals Patient Stated Goal: to walk PT Goal Formulation: With patient Time For Goal Achievement: 01/08/19 Potential to Achieve Goals: Good    Frequency Min 3X/week   Barriers to discharge        Co-evaluation               AM-PAC PT "6 Clicks" Mobility  Outcome Measure Help needed turning from your back to your side while in a flat bed without using bedrails?: A Lot Help needed moving from lying on your back to sitting on the side of a flat bed without using bedrails?: Total Help needed moving to and from a bed to a chair (including a wheelchair)?: Total Help needed standing up from a chair using your arms (e.g., wheelchair or bedside chair)?: Total Help needed to walk in hospital room?: Total Help needed climbing 3-5 steps with a railing? : Total 6 Click Score: 7    End of Session Equipment Utilized During Treatment: Gait belt Activity Tolerance: Patient limited by pain Patient left: in bed;with call bell/phone within reach;with bed alarm set Nurse Communication: Mobility status;Need for lift equipment;Precautions(KI at all times) PT Visit Diagnosis: History of falling (Z91.81);Difficulty in walking, not elsewhere classified (R26.2);Pain Pain - Right/Left:  Right Pain - part of body: Hip    Time: 8811-0315 PT Time Calculation (min) (ACUTE ONLY): 34 min   Charges:   PT Evaluation $PT Eval Moderate Complexity: 1 Mod PT Treatments $Therapeutic Activity: 8-22 mins       Blondell Reveal Kistler PT 12/25/2018  Acute Rehabilitation Services Pager [phone] Office [phone]

## 2018-12-25 NOTE — Progress Notes (Signed)
CRITICAL VALUE ALERT  Critical Value:  Ca 6.0  Date & Time Notied:  12/25/18  Provider Notified: Baltazar Najjar, NP  Orders Received/Actions taken: Awaiting new orders

## 2018-12-25 NOTE — Progress Notes (Signed)
OT Cancellation Note  Patient Details Name: Jenna Marquez MRN: [id number] DOB: March 21, 1946   Cancelled Treatment:    Reason Eval/Treat Not Completed: Other (comment)   Noted pts hemoglobin 6.5 and plans to get blood. Will check on pt later in day or next day.  CNA reports significant pain moving In bed this morning.  Kari Baars, OT Acute Rehabilitation Services [phone] Office- [phone], Mickel Baas, [place] Acute Rehabilitation Services [phone] [phone]    12/25/2018, 11:21 AM

## 2018-12-25 NOTE — Progress Notes (Signed)
Pharmacy IV to PO conversion  The patient is receiving Folate and Thiamine by the intravenous route.  Based on criteria approved by the Pharmacy and [place], the medication is being converted to the equivalent oral dose form.   No active GI bleeding or impaired absorption  Not s/p esophagectomy  Documented ability to take oral medications for > 24 hr  Plan to continue treatment for at least 1 day  If you have any questions about this conversion, please contact the Pharmacy Department (ext [phone]).  Thank you.  Reuel Boom, PharmD, BCPS [phone] 12/25/2018, 10:13 AM

## 2018-12-25 NOTE — Progress Notes (Signed)
PROGRESS NOTE    Jenna Marquez  PFX:[id number] DOB: 06-03-1946 DOA: 12/23/2018 PCP: Darreld Mclean, MD   Brief Narrative: As per HPI:  73 y.o. BF PMHx anxiety, major depressive disorder, COPD, chronic respiratory failure with hypoxia, tobacco abuse, EtOH abuse, high-grade AV block S/P pacemaker, paroxysmal atrial fibrillation not on blood thinners), RBBB, CKD stage III who was ambulatory at baseline living with her niece admitted after a fall while trying to transfer to chair and landing on the right hip and arm following which is complaining of head pain leg pain.  Patient unable to stand up. In the ER vitals stable found to have right femur fracture, orthopedic consulted for operative intervention and was admitted. Seen by orthopedic, status post IM nail 5/11 5/12-low hemoglobin 6.5  Subjective: Seen and examined this morning.  He was resting comfortably.  Somewhat upset about her juice.  Denies nausea vomiting chest pain.  Assessment & Plan:   Closed displaced comminuted intertrochanteric fracture of right femur: From fall.  Orthopedic surgery on board, status post IM nailing 5/11.  Encourage incentive spirometry, DVT prophylaxis with Lovenox, PT OT as per orthopedic.  Right knee pain/effusion: Xray questionable partial quad tendon rupture. eexam limited by pain and weakness with hip injury.  Continue knee immobilizer, pain control. As per ortho  Acute blood loss anemia, in the setting of fracture as above, possible quadricep rupture on the right knee. Transfusing hemoglobin to keep above 7 to 8 g.  Monitor H&H. (last h/h 6.4 was during blood transfusion and repeating it)  Recent Labs  Lab 12/23/18 0748 12/23/18 1425 12/25/18 0142 12/25/18 0804  HGB 12.4 11.0* 6.4* 6.5*  HCT 37.3 33.7* 21.1* 20.3*   COPD/hx of Tobacco abuse: On room air, respiratory status is stable, optimize respiratory status and continue on Dulera, bronchodilators, incentive spirometry.  Paroxysmal  atrial fibrillation history/Hx of Mobitz type 2 second degree AV block, s/p Pacemaker : Currently paced rhythm/normal sinus rhythm, echocardiogram obtained preoperatively and shows normal systolic function, no acute significant finding.cont metoprolol.  Hypomagnesemia mag 1.3, repleteed, cont po magnesium. Marland Kitchen  Hypocalcemia, in 6.0- albumin 1.9 corrected ca:7.7- added calcium carbonate. Repeat bmp in am.  HTN: Blood pressure controlled on metoprolol.  CKD stage 3: Creatinine stable at 1.9-1.1.  Creatinine stable at 0.6  Generalized anxiety disorder/MDD: Mood is stable, continue home Neurontin.  Alcohol use: Admits drinking brandy at home, continue CIWA Ativan.  Overall stable.  DVT prophylaxis: SCD. Lovenox and ambulation.  Code Status: DNR,confiremd with daughter-in-law Dr. Arvid Right by the admitting physician Family Communication: Discussed with the patient verbalized understanding.  Patient declined my offer to update her family, reports she had just talked her son and daughter-in-law this am. Disposition Plan: Continue PT OT, anticipating skilled nursing facility placement  Consultants:  Orthopedic surgery  Procedures:  Limited echocardiogram 5/10  The left ventricle has normal systolic function, with an ejection fraction of 60-65%. The cavity size was normal. Left ventricular diastolic function could not be evaluated.  2. The right ventricle has normal systolc function. The cavity was normal. There is no increase in right ventricular wall thickness. Right ventricular systolic pressure is moderately elevated with an estimated pressure of 40.9 mmHg.  3. No evidence of mitral valve stenosis.  4. The aortic valve is tricuspid No stenosis of the aortic valve.  5. Pulmonic valve regurgitation was not assessed by color flow Doppler Antimicrobials: Anti-infectives (From admission, onward)   Start     Dose/Rate Route Frequency Ordered Stop   12/24/18  2000  ceFAZolin (ANCEF) IVPB 2g/100 mL  premix     2 g 200 mL/hr over 30 Minutes Intravenous Every 6 hours 12/24/18 1604 12/25/18 0317   12/24/18 1230  vancomycin (VANCOCIN) IVPB 1000 mg/200 mL premix     1,000 mg 200 mL/hr over 60 Minutes Intravenous  Once 12/24/18 0805 12/24/18 1337   12/24/18 0815  ceFAZolin (ANCEF) IVPB 2g/100 mL premix     2 g 200 mL/hr over 30 Minutes Intravenous On call to O.R. 12/24/18 0805 12/24/18 1343       Objective: Vitals:   12/25/18 0438 12/25/18 0516 12/25/18 0649 12/25/18 0830  BP: (!) 109/48 (!) 118/51  104/84  Pulse: 72 84  (!) 58  Resp: 20 20  12   Temp: 98.4 F (36.9 C) 98 F (36.7 C)  98.1 F (36.7 C)  TempSrc: Oral Oral  Oral  SpO2: 100% 100% 100% 90%  Weight:      Height:        Intake/Output Summary (Last 24 hours) at 12/25/2018 1204 Last data filed at 12/25/2018 0830 Gross per 24 hour  Intake 2829.23 ml  Output 750 ml  Net 2079.23 ml   Filed Weights   12/23/18 1408 12/23/18 1511 12/24/18 0500  Weight: 62.8 kg 62.8 kg 65.2 kg   Weight change:   Body mass index is 22.85 kg/m.  Intake/Output from previous day: 05/11 0701 - 05/12 0700 In: 2367.1 [P.O.:250; I.V.:1817.1; IV Piggyback:300] Out: 750 [Urine:650; Blood:100] Intake/Output this shift: Total I/O In: 462.2 [Blood:462.2] Out: -   Examination: General exam: Calm, comfortable, not in acute distress, older for age, average built.  HEENT:Oral mucosa moist, Ear/Nose WNL grossly, dentition normal. Respiratory system: Bilateral diminished breath sounds,no use of accessory muscle Cardiovascular system: regular rate and rhythm, S1 & S2 heard, No JVD/murmurs. Gastrointestinal system: Abdomen soft, non-tender, non-distended, BS +.  Nervous System:Alert, awake and oriented at baseline. Able to move UE and LE, sensation intact. Extremities: rt knee swollen and tender, distal peripheral pulses palpable.  Skin: No rashes,no icterus. MSK: Normal muscle bulk,tone, power Right hip with surgical dressing intact,   Medications:  Scheduled Meds: . sodium chloride   Intravenous Once  . calcium carbonate  1 tablet Oral BID WC  . Chlorhexidine Gluconate Cloth  6 each Topical Q0600  . docusate sodium  100 mg Oral BID  . enoxaparin (LOVENOX) injection  40 mg Subcutaneous Q24H  . folic acid  1 mg Oral Daily  . ipratropium-albuterol  3 mL Nebulization QID  . magnesium oxide  400 mg Oral BID  . metoprolol tartrate  25 mg Oral BID  . mometasone-formoterol  2 puff Inhalation BID  . multivitamin with minerals  1 tablet Oral Daily  . mupirocin ointment  1 application Nasal BID  . [START ON 12/26/2018] thiamine  100 mg Oral Daily   Continuous Infusions: . sodium chloride 125 mL/hr at 12/25/18 0245  . methocarbamol (ROBAXIN) IV Stopped (12/24/18 0106)    Data Reviewed: I have personally reviewed following labs and imaging studies  CBC: Recent Labs  Lab 12/23/18 0748 12/23/18 1425 12/25/18 0142 12/25/18 0804  WBC 8.3 9.5 10.0  --   NEUTROABS 4.9  --   --   --   HGB 12.4 11.0* 6.4* 6.5*  HCT 37.3 33.7* 21.1* 20.3*  MCV 114.1* 115.8* 121.3*  --   PLT 217 198 149*  --    Basic Metabolic Panel: Recent Labs  Lab 12/23/18 0748 12/23/18 1425 12/24/18 0355 12/25/18 0142  NA 139  --  139 140  K 5.1  --  4.2 3.8  CL 101  --  106 117*  CO2 26  --  25 18*  GLUCOSE 139*  --  128* 110*  BUN 14  --  18 13  CREATININE 1.10* 0.92 0.91 0.65  CALCIUM 8.7*  --  7.7* 6.0*  MG  --   --  1.3* 1.7   GFR: Estimated Creatinine Clearance: 58 mL/min (by C-G formula based on SCr of 0.65 mg/dL). Liver Function Tests: Recent Labs  Lab 12/25/18 0804  AST 29  ALT 15  ALKPHOS 58  BILITOT 0.4  PROT 4.1*  ALBUMIN 1.9*   No results for input(s): LIPASE, AMYLASE in the last 168 hours. No results for input(s): AMMONIA in the last 168 hours. Coagulation Profile: Recent Labs  Lab 12/23/18 0748  INR 1.0   Cardiac Enzymes: No results for input(s): CKTOTAL, CKMB, CKMBINDEX, TROPONINI in the last 168 hours.  BNP (last 3 results) Recent Labs    10/15/18 1017  PROBNP 150.0*   HbA1C: No results for input(s): HGBA1C in the last 72 hours. CBG: No results for input(s): GLUCAP in the last 168 hours. Lipid Profile: No results for input(s): CHOL, HDL, LDLCALC, TRIG, CHOLHDL, LDLDIRECT in the last 72 hours. Thyroid Function Tests: No results for input(s): TSH, T4TOTAL, FREET4, T3FREE, THYROIDAB in the last 72 hours. Anemia Panel: No results for input(s): VITAMINB12, FOLATE, FERRITIN, TIBC, IRON, RETICCTPCT in the last 72 hours. Sepsis Labs: No results for input(s): PROCALCITON, LATICACIDVEN in the last 168 hours.  Recent Results (from the past 240 hour(s))  SARS Coronavirus 2 (CEPHEID - Performed in [facility] hospital lab), Hosp Order     Status: None   Collection Time: 12/23/18  9:36 AM  Result Value Ref Range Status   SARS Coronavirus 2 NEGATIVE NEGATIVE Final    Comment: (NOTE) If result is NEGATIVE SARS-CoV-2 target nucleic acids are NOT DETECTED. The SARS-CoV-2 RNA is generally detectable in upper and lower  respiratory specimens during the acute phase of infection. The lowest  concentration of SARS-CoV-2 viral copies this assay can detect is 250  copies / mL. A negative result does not preclude SARS-CoV-2 infection  and should not be used as the sole basis for treatment or other  patient management decisions.  A negative result may occur with  improper specimen collection / handling, submission of specimen other  than nasopharyngeal swab, presence of viral mutation(s) within the  areas targeted by this assay, and inadequate number of viral copies  (<250 copies / mL). A negative result must be combined with clinical  observations, patient history, and epidemiological information. If result is POSITIVE SARS-CoV-2 target nucleic acids are DETECTED. The SARS-CoV-2 RNA is generally detectable in upper and lower  respiratory specimens dur ing the acute phase of infection.  Positive   results are indicative of active infection with SARS-CoV-2.  Clinical  correlation with patient history and other diagnostic information is  necessary to determine patient infection status.  Positive results do  not rule out bacterial infection or co-infection with other viruses. If result is PRESUMPTIVE POSTIVE SARS-CoV-2 nucleic acids MAY BE PRESENT.   A presumptive positive result was obtained on the submitted specimen  and confirmed on repeat testing.  While 2019 novel coronavirus  (SARS-CoV-2) nucleic acids may be present in the submitted sample  additional confirmatory testing may be necessary for epidemiological  and / or clinical management purposes  to differentiate between  SARS-CoV-2 and other Sarbecovirus currently known  to infect humans.  If clinically indicated additional testing with an alternate test  methodology [phone]) is advised. The SARS-CoV-2 RNA is generally  detectable in upper and lower respiratory sp ecimens during the acute  phase of infection. The expected result is Negative. Fact Sheet for Patients:  StrictlyIdeas.no Fact Sheet for Healthcare Providers: BankingDealers.co.za This test is not yet approved or cleared by the [place] FDA and has been authorized for detection and/or diagnosis of SARS-CoV-2 by FDA under an Emergency Use Authorization (EUA).  This EUA will remain in effect (meaning this test can be used) for the duration of the COVID-19 declaration under Section 564(b)(1) of the Act, 21 U.S.C. section 360bbb-3(b)(1), unless the authorization is terminated or revoked sooner. Performed at [facility], [place] [address] [place], [place] [address]   Surgical PCR screen     Status: Abnormal   Collection Time: 12/23/18  7:58 PM  Result Value Ref Range Status   MRSA, PCR POSITIVE (A) NEGATIVE Final    Comment: RESULT CALLED TO, READ BACK BY AND VERIFIED WITH: M,STUTTON AT 0112 ON  12/24/18 BY A,MOHAMED    Staphylococcus aureus POSITIVE (A) NEGATIVE Final    Comment: (NOTE) The Xpert SA Assay (FDA approved for NASAL specimens in patients 20 years of age and older), is one component of a comprehensive surveillance program. It is not intended to diagnose infection nor to guide or monitor treatment. Performed at [facility], [place] [address], [place], [place] [address]       Radiology Studies: Dg Knee Right Port  Result Date: 12/24/2018 CLINICAL DATA:  Right knee pain secondary to a fall yesterday. EXAM: PORTABLE RIGHT KNEE - 1-2 VIEW COMPARISON:  None. FINDINGS: There is no fracture or dislocation. There is chondrocalcinosis visible in the medial and lateral compartments. Intramedullary nail in the distal femoral shaft with fixation screw. There is marked soft tissue swelling just above the patella. Finding is consistent with at least a partial rupture of the distal quadriceps tendon. There is a small amount of fluid in the suprapatellar bursa. IMPRESSION: Findings are consistent at least partial rupture of the distal quadriceps tendon. No acute bone abnormality. Electronically Signed   By: Lorriane Shire M.D.   On: 12/24/2018 15:36   Dg C-arm 1-60 Min-no Report  Result Date: 12/24/2018 Fluoroscopy was utilized by the requesting physician.  No radiographic interpretation.   Dg Hip Operative Unilat With Pelvis Right  Result Date: 12/24/2018 CLINICAL DATA:  Internal fixation of right proximal femur fracture. EXAM: OPERATIVE right HIP (WITH PELVIS IF PERFORMED) 4 VIEWS TECHNIQUE: Fluoroscopic spot image(s) were submitted for interpretation post-operatively. Radiation exposure index: 6.07 mGy. COMPARISON:  Radiographs of Dec 23, 2018. FINDINGS: Four fluoroscopic images demonstrate intramedullary rod fixation of the right femur with internal fixation of intertrochanteric fracture of proximal right femur. Good alignment of fracture components is noted.  IMPRESSION: Fluoroscopic guidance provided during surgical internal fixation intertrochanteric fracture of proximal right femur. Electronically Signed   By: Marijo Conception M.D.   On: 12/24/2018 14:47      LOS: 2 days   Time spent: More than 50% of that time was spent in counseling and/or coordination of care.  Antonieta Pert, MD [facility]  12/25/2018, 12:04 PM

## 2018-12-25 NOTE — Progress Notes (Signed)
Subjective: Patient reports pain as moderate, improved.  Tolerating diet.  Not yet mobilized.  Objective:   VITALS:   Vitals:   12/25/18 0251 12/25/18 0438 12/25/18 0516 12/25/18 0649  BP: (!) 116/53 (!) 109/48 (!) 118/51   Pulse: 72 72 84   Resp: 20 20 20    Temp: 98 F (36.7 C) 98.4 F (36.9 C) 98 F (36.7 C)   TempSrc: Oral Oral Oral   SpO2: 100% 100% 100% 100%  Weight:      Height:       CBC Latest Ref Rng & Units 12/25/2018 12/23/2018 12/23/2018  WBC 4.0 - 10.5 K/uL 10.0 9.5 8.3  Hemoglobin 12.0 - 15.0 g/dL 6.4(LL) 11.0(L) 12.4  Hematocrit 36.0 - 46.0 % 21.1(L) 33.7(L) 37.3  Platelets 150 - 400 K/uL 149(L) 198 217   BMP Latest Ref Rng & Units 12/25/2018 12/24/2018 12/23/2018  Glucose 70 - 99 mg/dL 110(H) 128(H) -  BUN 8 - 23 mg/dL 13 18 -  Creatinine 0.44 - 1.00 mg/dL 0.65 0.91 0.92  Sodium 135 - 145 mmol/L 140 139 -  Potassium 3.5 - 5.1 mmol/L 3.8 4.2 -  Chloride 98 - 111 mmol/L 117(H) 106 -  CO2 22 - 32 mmol/L 18(L) 25 -  Calcium 8.9 - 10.3 mg/dL 6.0(LL) 7.7(L) -   Intake/Output      05/11 0701 - 05/12 0700 05/12 0701 - 05/13 0700   P.O. 250    I.V. (mL/kg) 1817.1 (27.9)    IV Piggyback 300    Total Intake(mL/kg) 2367.1 (36.3)    Urine (mL/kg/hr) 650 (0.4)    Blood 100    Total Output 750    Net +1617.1            Physical Exam: General: NAD.  Supine in bed.  Just completed breathing tx w/ RT.  Calm, conversant. MSK RLE: Right knee w/ slightly improved effusion.  Lacking knee extension. Sensation intact distally Feet warm Dorsiflexion/Plantar flexion, EHL/FHL intact Incisions: dressings C/D/I  Assessment: 1 Day Post-Op  S/P Procedure(s) (LRB): RIGHT INTRAMEDULLARY (IM) NAIL INTERTROCHANTRIC (Right) by Dr. Ernesta Amble. Murphy on 12/24/18  Principal Problem:   Closed displaced intertrochanteric fracture of right femur ([place]) Active Problems:   COPD (chronic obstructive pulmonary disease) gold stage C.   Tobacco abuse   Generalized anxiety  disorder   Pacemaker   CKD (chronic kidney disease) stage 3, GFR 30-59 ml/min (HCC)   Paroxysmal atrial fibrillation (HCC)   Mobitz type 2 second degree AV block   HTN (hypertension)   Alcohol abuse   MDD (major depressive disorder), single episode, severe , no psychosis ([place])   Chronic respiratory failure with hypoxia (HCC)   Closed right hip fracture ([place])   Open right hip fracture (HCC)   Closed Right Intertrochanteric Femur Fracture, s/p IM nail  Pain controlled Not yet mobilized  Right knee pain / effusion: XR suggests ?partial Quad tendon rupture.  Exam equivocal and limited by pain/weakness with hip injury. Knee immobilizer at all times for now.    ABLA:  1 unit PRBC ordered.  Denies symptoms.  Likely also w/ dilutional component.   Plan: Up with therapy Incentive Spirometry Elevate and Apply ice  Weightbearing: WBAT RLE Insicional and dressing care: Dressings left intact until follow-up Orthopedic device(s): knee immobilizer Showering: Keep dressing dry VTE prophylaxis: Lovenox 40mg  qd, SCDs, ambulation Pain control: Minimize narcotics.  Tylenol.   Norco for breakthrough. Follow - up plan: 2 weeks Contact information:  Edmonia Lynch MD, [place] Hockey  PA-C   [place] III, PA-C 12/25/2018, 7:20 AM

## 2018-12-26 ENCOUNTER — Inpatient Hospital Stay (HOSPITAL_COMMUNITY): Payer: PPO

## 2018-12-26 DIAGNOSIS — R0603 Acute respiratory distress: Secondary | ICD-10-CM

## 2018-12-26 LAB — TYPE AND SCREEN
ABO/RH(D): O NEG
Antibody Screen: NEGATIVE
Unit division: 0

## 2018-12-26 LAB — CBC
HCT: 25 % — ABNORMAL LOW (ref 36.0–46.0)
Hemoglobin: 8.1 g/dL — ABNORMAL LOW (ref 12.0–15.0)
MCH: 34.8 pg — ABNORMAL HIGH (ref 26.0–34.0)
MCHC: 32.4 g/dL (ref 30.0–36.0)
MCV: 107.3 fL — ABNORMAL HIGH (ref 80.0–100.0)
Platelets: 171 10*3/uL (ref 150–400)
RBC: 2.33 MIL/uL — ABNORMAL LOW (ref 3.87–5.11)
RDW: 20.9 % — ABNORMAL HIGH (ref 11.5–15.5)
WBC: 9.5 10*3/uL (ref 4.0–10.5)
nRBC: 0.2 % (ref 0.0–0.2)

## 2018-12-26 LAB — BASIC METABOLIC PANEL
Anion gap: 7 (ref 5–15)
BUN: 23 mg/dL (ref 8–23)
CO2: 25 mmol/L (ref 22–32)
Calcium: 8.9 mg/dL (ref 8.9–10.3)
Chloride: 104 mmol/L (ref 98–111)
Creatinine, Ser: 1.24 mg/dL — ABNORMAL HIGH (ref 0.44–1.00)
GFR calc Af Amer: 49 mL/min — ABNORMAL LOW (ref >60)
GFR calc non Af Amer: 42 mL/min — ABNORMAL LOW (ref >60)
Glucose, Bld: 101 mg/dL — ABNORMAL HIGH (ref 70–99)
Potassium: 4.5 mmol/L (ref 3.5–5.1)
Sodium: 136 mmol/L (ref 135–145)

## 2018-12-26 LAB — BPAM RBC
Blood Product Expiration Date: 202005302359
ISSUE DATE / TIME: 202005120446
Unit Type and Rh: 9500

## 2018-12-26 MED ORDER — FUROSEMIDE 10 MG/ML IJ SOLN
40.0000 mg | Freq: Once | INTRAMUSCULAR | Status: AC
  Administered 2018-12-26: 40 mg via INTRAVENOUS
  Filled 2018-12-26: qty 4

## 2018-12-26 MED ORDER — ALUM & MAG HYDROXIDE-SIMETH 200-200-20 MG/5ML PO SUSP
15.0000 mL | Freq: Four times a day (QID) | ORAL | Status: DC | PRN
  Administered 2018-12-26: 19:00:00 15 mL via ORAL
  Filled 2018-12-26: qty 30

## 2018-12-26 NOTE — Progress Notes (Signed)
[facility]  PROGRESS NOTE  [name]:[id number] DOB: August 09, 1946 DOA: 12/23/2018 PCP: Darreld Mclean, MD   Brief HPI:   73 year old female with a history of anxiety, major depressive disorder, COPD, chronic respiratory failure with hypoxia, tobacco use, EtOH abuse, high-grade AV block, status post pacemaker placement, paroxysmal atrial fibrillation not on blood thinners, RBBB, CKD stage III who admitted after a fall while trying to transfer to chair and landing on right hip and arm following which she complained of leg pain.  She was unable to stand.  She was brought to ED and found to have right femur fracture.  Orthopedics was consulted.  Patient underwent IM nail on 12/24/2018.  Also found to have hemoglobin 6.5 on 12/25/2018.    Subjective   Patient seen and examined, denies any complaints.  No chest pain or shortness of breath.   Assessment/Plan:     1. Pulmonary edema-likely acute diastolic CHF, IV fluids were discontinued this morning.  Lasix 40 mg IV given.  Urine output -1.4 L since this morning.  Chest x-ray showsIncreasing bibasilar infiltrates, pneumonia cannot be excluded.  Patient is afebrile with normal WBC.  Does not appear to have pneumonia.  Likely symptoms worse from acute diastolic CHF.  Improved with Lasix.  We will continue with Lasix 20 mg IV q. per hour.  Continue to wean off oxygen.  2. Close displaced comminuted intertrochanteric fracture of right femur-orthopedic surgery following.  Status post IM nailing on 12/24/2018.  Plan for discharge to skilled nursing facility  3. Acute blood loss anemia-in setting of IM nailing, also fluid overload.  Today hemoglobin is 8.1.  Follow CBC in a.m.  4. Paroxysmal atrial fibrillation-history of Mobitz type II second-degree AV block status post pacemaker-patient currently in normal sinus rhythm.  Continue metoprolol.Patient is not on anticoagulation.As per note from February 2018 from Dr. Verlon Au patient had  refused anticoagulation.  5. Hypomagnesemia-Magnesium was 1.7, will replace magnesium.  Check mag level in a.m.  6. Acute kidney injury-creatinine now 1.24, started on Lasix as above.  Follow BMP in a.m.     CBC: Recent Labs  Lab 12/23/18 0748 12/23/18 1425 12/25/18 0142 12/25/18 0804 12/25/18 1310 12/26/18 0840  WBC 8.3 9.5 10.0  --   --  9.5  NEUTROABS 4.9  --   --   --   --   --   HGB 12.4 11.0* 6.4* 6.5* 9.1* 8.1*  HCT 37.3 33.7* 21.1* 20.3* 27.1* 25.0*  MCV 114.1* 115.8* 121.3*  --   --  107.3*  PLT 217 198 149*  --   --  818    Basic Metabolic Panel: Recent Labs  Lab 12/23/18 0748 12/23/18 1425 12/24/18 0355 12/25/18 0142 12/26/18 0449  NA 139  --  139 140 136  K 5.1  --  4.2 3.8 4.5  CL 101  --  106 117* 104  CO2 26  --  25 18* 25  GLUCOSE 139*  --  128* 110* 101*  BUN 14  --  18 13 23   CREATININE 1.10* 0.92 0.91 0.65 1.24*  CALCIUM 8.7*  --  7.7* 6.0* 8.9  MG  --   --  1.3* 1.7  --      DVT prophylaxis: Lovenox  Code Status: DNR  Family Communication: No family at bedside  Disposition Plan: Skilled nursing facility     Consultants:  Orthopedics  Procedures:  IM nail for femur fracture   Antibiotics:   Anti-infectives (From admission, onward)  Start     Dose/Rate Route Frequency Ordered Stop   12/24/18 2000  ceFAZolin (ANCEF) IVPB 2g/100 mL premix     2 g 200 mL/hr over 30 Minutes Intravenous Every 6 hours 12/24/18 1604 12/25/18 0317   12/24/18 1230  vancomycin (VANCOCIN) IVPB 1000 mg/200 mL premix     1,000 mg 200 mL/hr over 60 Minutes Intravenous  Once 12/24/18 0805 12/24/18 1337   12/24/18 0815  ceFAZolin (ANCEF) IVPB 2g/100 mL premix     2 g 200 mL/hr over 30 Minutes Intravenous On call to O.R. 12/24/18 0805 12/24/18 1343       Objective   Vitals:   12/26/18 0632 12/26/18 0817 12/26/18 0820 12/26/18 1330  BP: (!) 126/57   129/61  Pulse: (!) 107   (!) 103  Resp: 18   18  Temp: 98.4 F (36.9 C)   98.7 F (37.1 C)   TempSrc: Oral   Oral  SpO2: 95% 96% 96% 97%  Weight:      Height:        Intake/Output Summary (Last 24 hours) at 12/26/2018 1754 Last data filed at 12/26/2018 1539 Gross per 24 hour  Intake 2294.08 ml  Output 1450 ml  Net 844.08 ml   Filed Weights   12/23/18 1408 12/23/18 1511 12/24/18 0500  Weight: 62.8 kg 62.8 kg 65.2 kg     Physical Examination:    General: Appears in no acute distress  Cardiovascular: S1-S2, regular, no murmur auscultated  Respiratory: Bilateral rhonchi auscultated  Abdomen: Abdomen is soft, nontender, no organomegaly  Extremities: No edema in the lower extremities  Neurologic: Cranial nerves II through grossly intact, motor strength 5/5 in all extremities     Data Reviewed: I have personally reviewed following labs and imaging studies   Recent Results (from the past 240 hour(s))  SARS Coronavirus 2 (CEPHEID - Performed in [place] hospital lab), Hosp Order     Status: None   Collection Time: 12/23/18  9:36 AM  Result Value Ref Range Status   SARS Coronavirus 2 NEGATIVE NEGATIVE Final    Comment: (NOTE) If result is NEGATIVE SARS-CoV-2 target nucleic acids are NOT DETECTED. The SARS-CoV-2 RNA is generally detectable in upper and lower  respiratory specimens during the acute phase of infection. The lowest  concentration of SARS-CoV-2 viral copies this assay can detect is 250  copies / mL. A negative result does not preclude SARS-CoV-2 infection  and should not be used as the sole basis for treatment or other  patient management decisions.  A negative result may occur with  improper specimen collection / handling, submission of specimen other  than nasopharyngeal swab, presence of viral mutation(s) within the  areas targeted by this assay, and inadequate number of viral copies  (<250 copies / mL). A negative result must be combined with clinical  observations, patient history, and epidemiological information. If result is  POSITIVE SARS-CoV-2 target nucleic acids are DETECTED. The SARS-CoV-2 RNA is generally detectable in upper and lower  respiratory specimens dur ing the acute phase of infection.  Positive  results are indicative of active infection with SARS-CoV-2.  Clinical  correlation with patient history and other diagnostic information is  necessary to determine patient infection status.  Positive results do  not rule out bacterial infection or co-infection with other viruses. If result is PRESUMPTIVE POSTIVE SARS-CoV-2 nucleic acids MAY BE PRESENT.   A presumptive positive result was obtained on the submitted specimen  and confirmed on repeat testing.  While  2019 novel coronavirus  (SARS-CoV-2) nucleic acids may be present in the submitted sample  additional confirmatory testing may be necessary for epidemiological  and / or clinical management purposes  to differentiate between  SARS-CoV-2 and other Sarbecovirus currently known to infect humans.  If clinically indicated additional testing with an alternate test  methodology [phone]) is advised. The SARS-CoV-2 RNA is generally  detectable in upper and lower respiratory sp ecimens during the acute  phase of infection. The expected result is Negative. Fact Sheet for Patients:  StrictlyIdeas.no Fact Sheet for Healthcare Providers: BankingDealers.co.za This test is not yet approved or cleared by the [place] FDA and has been authorized for detection and/or diagnosis of SARS-CoV-2 by FDA under an Emergency Use Authorization (EUA).  This EUA will remain in effect (meaning this test can be used) for the duration of the COVID-19 declaration under Section 564(b)(1) of the Act, 21 U.S.C. section 360bbb-3(b)(1), unless the authorization is terminated or revoked sooner. Performed at [facility], [place] [address] [place], [place] [address]   Surgical PCR screen     Status: Abnormal    Collection Time: 12/23/18  7:58 PM  Result Value Ref Range Status   MRSA, PCR POSITIVE (A) NEGATIVE Final    Comment: RESULT CALLED TO, READ BACK BY AND VERIFIED WITH: M,STUTTON AT 0112 ON 12/24/18 BY A,MOHAMED    Staphylococcus aureus POSITIVE (A) NEGATIVE Final    Comment: (NOTE) The Xpert SA Assay (FDA approved for NASAL specimens in patients 52 years of age and older), is one component of a comprehensive surveillance program. It is not intended to diagnose infection nor to guide or monitor treatment. Performed at [facility], [place] [address], [place], [place] [address]      Liver Function Tests: Recent Labs  Lab 12/25/18 0804  AST 29  ALT 15  ALKPHOS 58  BILITOT 0.4  PROT 4.1*  ALBUMIN 1.9*   No results for input(s): LIPASE, AMYLASE in the last 168 hours. No results for input(s): AMMONIA in the last 168 hours.  Cardiac Enzymes: No results for input(s): CKTOTAL, CKMB, CKMBINDEX, TROPONINI in the last 168 hours. BNP (last 3 results) Recent Labs    02/18/18 2114  BNP 136.4*    ProBNP (last 3 results) Recent Labs    10/15/18 1017  PROBNP 150.0*      Studies: Dg Chest 2 View  Result Date: 12/26/2018 CLINICAL DATA:  Shortness of breath EXAM: CHEST - 2 VIEW COMPARISON:  12/23/2018 FINDINGS: Left pacer remains in place, unchanged. Heart is normal size. Increasing bibasilar opacities, right greater than left could reflect atelectasis or infiltrates. No visible significant effusions or acute bony abnormality. IMPRESSION: Increasing bibasilar atelectasis or infiltrates, right greater than left. Pneumonia cannot be excluded in the right lung base. Electronically Signed   By: Rolm Baptise M.D.   On: 12/26/2018 13:20    Scheduled Meds: . sodium chloride   Intravenous Once  . calcium carbonate  1 tablet Oral BID WC  . Chlorhexidine Gluconate Cloth  6 each Topical Q0600  . docusate sodium  100 mg Oral BID  . enoxaparin (LOVENOX) injection  40 mg  Subcutaneous Q24H  . folic acid  1 mg Oral Daily  . ipratropium-albuterol  3 mL Nebulization QID  . magnesium oxide  400 mg Oral BID  . metoprolol tartrate  25 mg Oral BID  . mometasone-formoterol  2 puff Inhalation BID  . multivitamin with minerals  1 tablet Oral Daily  . mupirocin  ointment  1 application Nasal BID  . thiamine  100 mg Oral Daily    Admission status: Inpatient: Based on patients clinical presentation and evaluation of above clinical data, I have made determination that patient meets Inpatient criteria at this time.  Time spent: 20 min  [facility] Pager [phone]. If 7PM-7AM, please contact night-coverage at [email or web address], Office  [phone]  password [email or web address]  12/26/2018, 5:54 PM  LOS: 3 days

## 2018-12-26 NOTE — Evaluation (Signed)
Occupational Therapy Evaluation Patient Details Name: Jenna Marquez MRN: [id number] DOB: 1945-10-21 Today's Date: 12/26/2018    History of Present Illness 73 y.o. BF PMHx anxiety, major depressive disorder, COPD, chronic respiratory failure with hypoxia, tobacco abuse, EtOH abuse, high-grade AV block S/P pacemaker, paroxysmal atrial fibrillation not on blood thinners), RBBB, CKD stage III who was ambulatory at baseline living with her niece admitted after a fall while trying to transfer to chair. Dx of R IT fracture of femur, s/p IM nail, possible R partial quad tendon rupture   Clinical Impression   Pt admitted s/p fall; with R femur fracture. Pt currently with functional limitations due to the deficits listed below (see OT Problem List).  Pt will benefit from skilled OT to increase their safety and independence with ADL and functional mobility for ADL to facilitate discharge to venue listed below.      Follow Up Recommendations  SNF;Supervision/Assistance - 24 hour    Equipment Recommendations  None recommended by OT;Other (comment)(TBD)       Precautions / Restrictions Precautions Precautions: Fall Required Braces or Orthoses: Knee Immobilizer - Right Knee Immobilizer - Right: On at all times Restrictions Weight Bearing Restrictions: No Other Position/Activity Restrictions: WBAT      Mobility Bed Mobility Overal bed mobility: Needs Assistance     General bed mobility comments: max A to reposition in bed to eat lunch.  Pt crying upon OT arrival as she needed to be repositioned in bed for lunch          ADL either performed or assessed with clinical judgement   ADL Overall ADL's : Needs assistance/impaired Eating/Feeding: Bed level;Minimal assistance Eating/Feeding Details (indicate cue type and reason): OT Aed pt with lunch after repositioning her in bed in order to reach needed items Grooming: Wash/dry face;Bed level;Minimal assistance                                 General ADL Comments: Pt VERY emotional.  Pt unable to find cell phone, reach lunch and was uncomfortable in bed.  OT Aed pt reposition as well as Aed pt with lunch/ grooming.  Encouraged pt to self feed as well as self grooming while encouraging pt and listening to pts concerns.  OT offered to A pt to EOB for her lunch and pt declined with tears.  OT Aed pt call her nephew     Vision Patient Visual Report: No change from baseline              Pertinent Vitals/Pain Pain Assessment: Faces Faces Pain Scale: Hurts little more Pain Location: RLE with movement ( repositioning in bed) Pain Descriptors / Indicators: Sore;Guarding Pain Intervention(s): Limited activity within patient's tolerance;Repositioned;Monitored during session     Hand Dominance     Extremity/Trunk Assessment Upper Extremity Assessment Upper Extremity Assessment: Overall WFL for tasks assessed           Communication Communication Communication: No difficulties   Cognition Arousal/Alertness: Awake/alert Behavior During Therapy: WFL for tasks assessed/performed(emotional.  teary at times. missing family) Overall Cognitive Status: Within Functional Limits for tasks assessed                                 General Comments: OT Aed pt call her nephew which seemed to comfort her   General Comments  Home Living Family/patient expects to be discharged to:: Private residence Living Arrangements: Other (Comment);Non-relatives/Friends(god-daughter)   Type of Home: House Home Access: Stairs to enter [employer] of Steps: 1   Home Layout: One level               Home Equipment: None          Prior Functioning/Environment Level of Independence: Independent        Comments: walks without AD        OT Problem List: Decreased strength;Decreased activity tolerance;Decreased safety awareness;Decreased knowledge of precautions;Decreased  knowledge of use of DME or AE;Impaired balance (sitting and/or standing);Pain      OT Treatment/Interventions: Self-care/ADL training;Patient/family education;Therapeutic activities;DME and/or AE instruction    OT Goals(Current goals can be found in the care plan section) Acute Rehab OT Goals Patient Stated Goal: to go home OT Goal Formulation: With patient Time For Goal Achievement: 01/09/19  OT Frequency: Min 2X/week    AM-PAC OT "6 Clicks" Daily Activity     Outcome Measure Help from another person eating meals?: A Little Help from another person taking care of personal grooming?: A Little Help from another person toileting, which includes using toliet, bedpan, or urinal?: Total Help from another person bathing (including washing, rinsing, drying)?: A Lot Help from another person to put on and taking off regular upper body clothing?: A Lot Help from another person to put on and taking off regular lower body clothing?: Total 6 Click Score: 12   End of Session Nurse Communication: Mobility status  Activity Tolerance: Patient tolerated treatment well(bed level session) Patient left: in bed;with call bell/phone within reach;with bed alarm set  OT Visit Diagnosis: Unsteadiness on feet (R26.81);Muscle weakness (generalized) (M62.81);Other abnormalities of gait and mobility (R26.89);History of falling (Z91.81)                Time: 2694-8546 OT Time Calculation (min): 12 min Charges:  OT General Charges $OT Visit: 1 Visit OT Evaluation $OT Eval Moderate Complexity: 1 Mod  Kari Baars, OT Acute Rehabilitation Services [phone] Office- [phone], Edwena Felty D 12/26/2018, 5:03 PM

## 2018-12-26 NOTE — Progress Notes (Addendum)
Pt's pacer continues to be undersensing. Please F/U with cardiac. Pt was given 2 mg Ativan @ 0202 d/t c/o itching and anxiety. Pt rested well after she was given Ativan.

## 2018-12-26 NOTE — Progress Notes (Addendum)
Subjective: Denies pain.  Somewhat confused this morning - had to be re-oriented to place and reason for hospitalization.  Tolerating diet.   Objective:   VITALS:   Vitals:   12/25/18 1943 12/25/18 2220 12/26/18 0211 12/26/18 0632  BP:  (!) 144/83 (!) 104/57 (!) 126/57  Pulse:  99 88 (!) 107  Resp:  20 18 18   Temp:  99.3 F (37.4 C) 98.7 F (37.1 C) 98.4 F (36.9 C)  TempSrc:  Oral Oral Oral  SpO2: 93% 98% 97% 95%  Weight:      Height:       CBC Latest Ref Rng & Units 12/25/2018 12/25/2018 12/25/2018  WBC 4.0 - 10.5 K/uL - - 10.0  Hemoglobin 12.0 - 15.0 g/dL 9.1(L) 6.5(LL) 6.4(LL)  Hematocrit 36.0 - 46.0 % 27.1(L) 20.3(L) 21.1(L)  Platelets 150 - 400 K/uL - - 149(L)   BMP Latest Ref Rng & Units 12/26/2018 12/25/2018 12/24/2018  Glucose 70 - 99 mg/dL 101(H) 110(H) 128(H)  BUN 8 - 23 mg/dL 23 13 18   Creatinine 0.44 - 1.00 mg/dL 1.24(H) 0.65 0.91  Sodium 135 - 145 mmol/L 136 140 139  Potassium 3.5 - 5.1 mmol/L 4.5 3.8 4.2  Chloride 98 - 111 mmol/L 104 117(H) 106  CO2 22 - 32 mmol/L 25 18(L) 25  Calcium 8.9 - 10.3 mg/dL 8.9 6.0(LL) 7.7(L)   Intake/Output      05/12 0701 - 05/13 0700 05/13 0701 - 05/14 0700   P.O.     I.V. (mL/kg) 2624.8 (40.3)    Blood 462.2    IV Piggyback 0    Total Intake(mL/kg) 3087 (47.3)    Urine (mL/kg/hr) 450 (0.3)    Blood     Total Output 450    Net +2637            Physical Exam: General: NAD.  Upright in bed.  MSK RLE: Right knee w/ improving swelling.  Mildly tender. Sensation intact distally Dorsiflexion/Plantar flexion, EHL/FHL intact Incisions: dressings C/D/I  Assessment: 2 Days Post-Op  S/P Procedure(s) (LRB): RIGHT INTRAMEDULLARY (IM) NAIL INTERTROCHANTRIC (Right) by Dr. Ernesta Amble. Murphy on 12/24/18  Principal Problem:   Closed displaced intertrochanteric fracture of right femur ([place]) Active Problems:   COPD (chronic obstructive pulmonary disease) gold stage C.   Tobacco abuse   Generalized anxiety disorder  Pacemaker   CKD (chronic kidney disease) stage 3, GFR 30-59 ml/min (HCC)   Paroxysmal atrial fibrillation (HCC)   Mobitz type 2 second degree AV block   HTN (hypertension)   Alcohol abuse   MDD (major depressive disorder), single episode, severe , no psychosis ([place])   Chronic respiratory failure with hypoxia (HCC)   Closed right hip fracture (HCC)   Open right hip fracture (HCC)   Closed Right Intertrochanteric Femur Fracture, s/p IM nail  Pain controlled Early mobilization - SNF Recommended.  Right knee pain / effusion: XR suggests ?partial Quad tendon rupture.  No bony abnormality.  Tendon appears to be intact on Exam, but function limited by pain/weakness with hip injury.   Okay for WBAT - Knee immobilizer at all times.    ABLA:  Hgb improved after transfusion.  Denies symptoms.  No sign of active bleeding.  Likely also w/ dilutional component.   Plan: Up with therapy Incentive Spirometry Elevate and Apply ice Continue Medical care per primary team.  Weightbearing: WBAT RLE Insicional and dressing care: Dressings left intact until follow-up Orthopedic device(s): knee immobilizer Showering: Keep dressing dry VTE prophylaxis: Lovenox 40mg   qd, SCDs, ambulation Pain control: Minimize narcotics.  Tylenol.   Norco for breakthrough. Follow - up plan: 2 weeks Contact information:  Edmonia Lynch MD, Roxan Hockey PA-C Dispo:  Discharge when ready medically.   Charna Elizabeth Martensen III, PA-C 12/26/2018, 7:28 AM

## 2018-12-26 NOTE — Care Management Important Message (Signed)
Important Message  Patient Details IM letter given to Cookie McGiboney RN to present to the Patient Name: Jenna Marquez MRN: [id number] Date of Birth: Dec 23, 1945   Medicare Important Message Given:  Yes    Kerin Salen 12/26/2018, 10:39 AM

## 2018-12-26 NOTE — Progress Notes (Signed)
Physical Therapy Treatment Patient Details Name: Jenna Marquez MRN: [id number] DOB: 05/07/1946 Today's Date: 12/26/2018    History of Present Illness 73 y.o. BF PMHx anxiety, major depressive disorder, COPD, chronic respiratory failure with hypoxia, tobacco abuse, EtOH abuse, high-grade AV block S/P pacemaker, paroxysmal atrial fibrillation not on blood thinners), RBBB, CKD stage III who was ambulatory at baseline living with her niece admitted after a fall while trying to transfer to chair. Dx of R IT fracture of femur, s/p IM nail, possible R partial quad tendon rupture    PT Comments    Pt very irritated with PT this session, swatting PT's hands away when assisting with LE mobility and trunk support and stating PT was "rough". This PT took extreme care and caution with mobility with this pt and does so with all pts, pt is just very painful in RLE with any and all movement. Pt transferred to and from [facility] with max assist +2, and was very unsteady with transfer. Pt was exhausted after this mobility. PT does not think pt could tolerate CIR at this point, updated plan to recommend only SNF placement at this time. PT to continue to follow acutely.    Follow Up Recommendations  SNF;Supervision/Assistance - 24 hour;Supervision for mobility/OOB     Equipment Recommendations  Rolling walker with 5" wheels;3in1 (PT)    Recommendations for Other Services       Precautions / Restrictions Precautions Precautions: Fall Required Braces or Orthoses: Knee Immobilizer - Right Knee Immobilizer - Right: On at all times Restrictions Weight Bearing Restrictions: No Other Position/Activity Restrictions: WBAT    Mobility  Bed Mobility Overal bed mobility: Needs Assistance Bed Mobility: Supine to Sit;Sit to Supine     Supine to sit: Max assist;+2 for physical assistance;+2 for safety/equipment;HOB elevated Sit to supine: Max assist;+2 for physical assistance;+2 for safety/equipment;HOB elevated    General bed mobility comments: Max assist +2 for LE management, trunk elevation and lowering, placement in bed with use of bed pad. Pt soaked in urine upon arrival due to [place] not being hooked to suction. Pt requesting PT move very slowly, and PT did best to minimally abduct pt's RLE due to pain. Pt helped ~10% with bed mobility.   Transfers Overall transfer level: Needs assistance Equipment used: Rolling walker (2 wheeled);2 person hand held assist Transfers: Sit to/from [facility] Sit to Stand: +2 physical assistance;Max assist         General transfer comment: Sit to stand x2, once from bed and once from [facility]. Pt required max assist +2 for power up, hip extension when rising, steadying upon standing. Pt stayed in hip flexion position, and was very anxious with mobility stating "Rich Fuchs" repeatedly during transfer and assist on and off of BSC.   Ambulation/Gait Ambulation/Gait assistance: (NT - pt very unsteady requiring max +2 for transfer)               Stairs             Wheelchair Mobility    Modified Rankin (Stroke Patients Only)       Balance Overall balance assessment: Needs assistance Sitting-balance support: Feet supported;Bilateral upper extremity supported Sitting balance-Leahy Scale: Poor Sitting balance - Comments: requires PT assist for upright posture, pt with posterior and R lateral leaning      Standing balance-Leahy Scale: Zero Standing balance comment: dependent on PT and PT aide for support  Cognition Arousal/Alertness: Awake/alert Behavior During Therapy: Agitated Overall Cognitive Status: No family/caregiver present to determine baseline cognitive functioning                                 General Comments: Upon PT arrival, pt asked "did you have to take a step to get in here?", PT asked pt if she knew where she was and pt states "yes I do!" but never answered  question. Pt very irritated with PT today, at one time slapping PT's hand and stating "Don't touch my leg", and other times told PT to "stop pulling on me" but PT was supporting pt trunk because pt unable to sit unsupported at this time. 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       Exercises      General Comments        Pertinent Vitals/Pain Pain Assessment: Faces Faces Pain Scale: Hurts whole lot Pain Location: RLE with movement Pain Descriptors / Indicators: Sharp;Sore;Guarding Pain Intervention(s): Monitored during session;Repositioned;Limited activity within patient's tolerance    Home Living                      Prior Function            PT Goals (current goals can now be found in the care plan section) Acute Rehab PT Goals Patient Stated Goal: to walk PT Goal Formulation: With patient Time For Goal Achievement: 01/08/19 Potential to Achieve Goals: Good Progress towards PT goals: Progressing toward goals    Frequency    Min 3X/week      PT Plan Current plan remains appropriate    Co-evaluation              AM-PAC PT "6 Clicks" Mobility   Outcome Measure  Help needed turning from your back to your side while in a flat bed without using bedrails?: Total Help needed moving from lying on your back to sitting on the side of a flat bed without using bedrails?: Total Help needed moving to and from a bed to a chair (including a wheelchair)?: Total Help needed standing up from a chair using your arms (e.g., wheelchair or bedside chair)?: Total Help needed to walk in hospital room?: Total Help needed climbing 3-5 steps with a railing? : Total 6 Click Score: 6    End of Session Equipment Utilized During Treatment: Gait belt;Oxygen Activity Tolerance: Patient limited by pain Patient left: in bed;with call bell/phone within reach;with nursing/sitter in room Nurse Communication: Mobility status PT Visit Diagnosis:  History of falling (Z91.81);Difficulty in walking, not elsewhere classified (R26.2);Pain Pain - Right/Left: Right Pain - part of body: Hip     Time: 7262-0355 PT Time Calculation (min) (ACUTE ONLY): 25 min  Charges:  $Therapeutic Activity: 23-37 mins                     Julien Girt, PT Acute Rehabilitation Services Pager [phone]  Office [phone]    Toriana Sponsel D Divonte Senger 12/26/2018, 1:13 PM

## 2018-12-26 NOTE — Telephone Encounter (Signed)
Pts companion states that pt is currenlty in the hospital due to a fall that occurred Sunday morning. I will continue to follow up when pt is out of the hospital.

## 2018-12-27 ENCOUNTER — Inpatient Hospital Stay (HOSPITAL_COMMUNITY): Payer: PPO

## 2018-12-27 DIAGNOSIS — R209 Unspecified disturbances of skin sensation: Secondary | ICD-10-CM

## 2018-12-27 LAB — CBC
HCT: 27.3 % — ABNORMAL LOW (ref 36.0–46.0)
Hemoglobin: 8.7 g/dL — ABNORMAL LOW (ref 12.0–15.0)
MCH: 34.1 pg — ABNORMAL HIGH (ref 26.0–34.0)
MCHC: 31.9 g/dL (ref 30.0–36.0)
MCV: 107.1 fL — ABNORMAL HIGH (ref 80.0–100.0)
Platelets: 233 10*3/uL (ref 150–400)
RBC: 2.55 MIL/uL — ABNORMAL LOW (ref 3.87–5.11)
RDW: 19.5 % — ABNORMAL HIGH (ref 11.5–15.5)
WBC: 11.7 10*3/uL — ABNORMAL HIGH (ref 4.0–10.5)
nRBC: 0.2 % (ref 0.0–0.2)

## 2018-12-27 LAB — BASIC METABOLIC PANEL
Anion gap: 10 (ref 5–15)
BUN: 14 mg/dL (ref 8–23)
CO2: 23 mmol/L (ref 22–32)
Calcium: 8.7 mg/dL — ABNORMAL LOW (ref 8.9–10.3)
Chloride: 106 mmol/L (ref 98–111)
Creatinine, Ser: 0.74 mg/dL (ref 0.44–1.00)
GFR calc Af Amer: >60 mL/min (ref >60)
GFR calc non Af Amer: >60 mL/min (ref >60)
Glucose, Bld: 97 mg/dL (ref 70–99)
Potassium: 3.9 mmol/L (ref 3.5–5.1)
Sodium: 139 mmol/L (ref 135–145)

## 2018-12-27 MED ORDER — ALBUTEROL SULFATE (2.5 MG/3ML) 0.083% IN NEBU
2.5000 mg | INHALATION_SOLUTION | RESPIRATORY_TRACT | Status: AC
  Administered 2018-12-27: 2.5 mg via RESPIRATORY_TRACT
  Filled 2018-12-27: qty 3

## 2018-12-27 MED ORDER — MAGNESIUM SULFATE IN D5W 1-5 GM/100ML-% IV SOLN
1.0000 g | Freq: Once | INTRAVENOUS | Status: AC
  Administered 2018-12-27: 1 g via INTRAVENOUS
  Filled 2018-12-27: qty 100

## 2018-12-27 NOTE — Progress Notes (Signed)
Pt's CIWA score was 16 @ 2256 Pt repored seeing bugs crawling on the walls and pt having conversations when no one was in her room. Pt given bed bath and  2 mg Ativan administered. Pt cooperative.   Pt's HUTM [phone]. Pt pulling clothes and tele leads off. Pulled Purewick out. Physically and verbally abusive toward staff. Pt given 3 mg Ativan.Pt has rested comfortably since that time. [phone])

## 2018-12-27 NOTE — Consult Note (Signed)
[facility] Nurse wound consult note Reason for Consult: interdigital partial thickness wound related to moisture on left foot. Wound type:moisture associated skin damage, specifically intertriginous dermatitis Pressure Injury POA: N/A Measurement: 0.5cm x 0.1cm x 0.1cm Wound HOZ:YYQM, moist Drainage (amount, consistency, odor) yellow/green thin drainage on old dressin Periwound:intact, macerated.  Two toes with purple/black discoloration, could be consistent with recent fall resulting in fracture. If you suspect vascular insufficiency, please consult Vascular Surgery for assessment. Dressing procedure/placement/frequency: I will provide the patient with bilateral heel pressure redistribution boots to prevent heel ulceration since patient is not likely to keep heels elevated off of bed surface (float heels). A sacral prophylactic foam dressing is in place. A pressure redistribution chair pad is provided for her use following discharge as well as while in house when she is getting OOB to chair.  A topical antimicrobial and absorbant dressing (Aquacel Ag+) is provided for between her toes with daily changes.  [facility] nursing team will not follow, but will remain available to this patient, the nursing and medical teams.  Please re-consult if needed. Thanks, Maudie Flakes, MSN, RN, [place], Arther Abbott  Pager# [phone]

## 2018-12-27 NOTE — Progress Notes (Addendum)
Am VS revealed pt is tachypneic w/ rep @ 31. BP 153/78. HR 105 O2 SAT 98% RA and axillary temp of 97.4. Pt is diaphoretic.Lung sounds have expiratory wheezing bi. She is arousable, but will not answer questions. WBC count was 9.5 yesterday and has gone up to 11.7. She has a MEWS score of 3 which puts her in yellow w/ VS Q 2.  Will notify On call. On call ordered neb Tx.

## 2018-12-27 NOTE — Plan of Care (Signed)
Pt is not fond of the food at the hospital. Pt has not had a BM since 5/9. This is being addressed with medication. Pt's  reported pain level has decreased.

## 2018-12-27 NOTE — Progress Notes (Signed)
[facility]  PROGRESS NOTE  Jenna Marquez:[id number] DOB: 1945/11/13 DOA: 12/23/2018 PCP: Darreld Mclean, MD   Brief HPI:   73 year old female with a history of anxiety, major depressive disorder, COPD, chronic respiratory failure with hypoxia, tobacco use, EtOH abuse, high-grade AV block, status post pacemaker placement, paroxysmal atrial fibrillation not on blood thinners, RBBB, CKD stage III who admitted after a fall while trying to transfer to chair and landing on right hip and arm following which she complained of leg pain.  She was unable to stand.  She was brought to ED and found to have right femur fracture.  Orthopedics was consulted.  Patient underwent IM nail on 12/24/2018.  Also found to have hemoglobin 6.5 on 12/25/2018.    Subjective   Patient seen and examined, denies chest pain or shortness of breath.  Wound care RN noticed bluish-black discoloration of two left toes.   Assessment/Plan:     1. Pulmonary edema-likely acute diastolic CHF, IV fluids were discontinued .  Patient was started on Lasix 20 mg IV every 12 hours.  Net -1.4 L. .  Chest x-ray showsIncreasing bibasilar infiltrates, pneumonia cannot be excluded.  Patient is afebrile with normal WBC.  Does not appear to have pneumonia.  Likely symptoms worse from acute diastolic CHF.  Improved with Lasix.  We will continue with Lasix 20 mg IV q. per 12 hour.  Patient is no longer requiring oxygen.  O2 sats are 96% on room air  2. Close displaced comminuted intertrochanteric fracture of right femur-orthopedic surgery following.  Status post IM nailing on 12/24/2018.  Plan for discharge to skilled nursing facility  3. Bluish discoloration of left 2 toes-likely from injury, tender to palpation, peripheral pulses palpable at posterior tibial on left.  Dorsalis pedis was not palpable.  Will obtain ABI.  4. Acute blood loss anemia-in setting of IM nailing, also fluid overload.  Improved from yesterday, today  hemoglobin is 8.7.  Follow CBC in a.m.  5. Paroxysmal atrial fibrillation-history of Mobitz type II second-degree AV block status post pacemaker-patient currently in normal sinus rhythm.  Continue metoprolol.Patient is not on anticoagulation.As per note from February 2018 from Dr. Verlon Au patient had refused anticoagulation.  6. Hypomagnesemia-Magnesium was 1.7, will replace magnesium.  Check mag level in a.m.  7. Acute kidney injury-creatinine now 1.24, started on Lasix as above.  Follow BMP in a.m.     CBC: Recent Labs  Lab 12/23/18 0748 12/23/18 1425 12/25/18 0142 12/25/18 0804 12/25/18 1310 12/26/18 0840 12/27/18 0503  WBC 8.3 9.5 10.0  --   --  9.5 11.7*  NEUTROABS 4.9  --   --   --   --   --   --   HGB 12.4 11.0* 6.4* 6.5* 9.1* 8.1* 8.7*  HCT 37.3 33.7* 21.1* 20.3* 27.1* 25.0* 27.3*  MCV 114.1* 115.8* 121.3*  --   --  107.3* 107.1*  PLT 217 198 149*  --   --  171 811    Basic Metabolic Panel: Recent Labs  Lab 12/23/18 0748 12/23/18 1425 12/24/18 0355 12/25/18 0142 12/26/18 0449 12/27/18 0503  NA 139  --  139 140 136 139  K 5.1  --  4.2 3.8 4.5 3.9  CL 101  --  106 117* 104 106  CO2 26  --  25 18* 25 23  GLUCOSE 139*  --  128* 110* 101* 97  BUN 14  --  18 13 23 14   CREATININE 1.10* 0.92 0.91 0.65 1.24* 0.74  CALCIUM 8.7*  --  7.7* 6.0* 8.9 8.7*  MG  --   --  1.3* 1.7  --   --      DVT prophylaxis: Lovenox  Code Status: DNR  Family Communication: No family at bedside  Disposition Plan: Skilled nursing facility     Consultants:  Orthopedics  Procedures:  IM nail for femur fracture   Antibiotics:   Anti-infectives (From admission, onward)   Start     Dose/Rate Route Frequency Ordered Stop   12/24/18 2000  ceFAZolin (ANCEF) IVPB 2g/100 mL premix     2 g 200 mL/hr over 30 Minutes Intravenous Every 6 hours 12/24/18 1604 12/25/18 0317   12/24/18 1230  vancomycin (VANCOCIN) IVPB 1000 mg/200 mL premix     1,000 mg 200 mL/hr over 60 Minutes  Intravenous  Once 12/24/18 0805 12/24/18 1337   12/24/18 0815  ceFAZolin (ANCEF) IVPB 2g/100 mL premix     2 g 200 mL/hr over 30 Minutes Intravenous On call to O.R. 12/24/18 0805 12/24/18 1343       Objective   Vitals:   12/27/18 0838 12/27/18 1029 12/27/18 1142 12/27/18 1145  BP: (!) 156/64 138/71    Pulse: 86 81    Resp: (!) 24 16    Temp: 98.4 F (36.9 C) 98.7 F (37.1 C)    TempSrc: Oral Oral    SpO2: 96% 97% 96% 96%  Weight:      Height:        Intake/Output Summary (Last 24 hours) at 12/27/2018 1350 Last data filed at 12/26/2018 2327 Gross per 24 hour  Intake 240 ml  Output 1650 ml  Net -1410 ml   Filed Weights   12/23/18 1511 12/24/18 0500 12/27/18 0533  Weight: 62.8 kg 65.2 kg 66.4 kg     Physical Examination:   General:  Appears in no acute distress  Cardiovascular: S1S2 RRR  Respiratory: Clear bilaterally  Abdomen: Abdomen is soft, nontender, no organomegaly  Extremities: No edema in the lower extremities, bluish discoloration noted in left third and fourth toe.  Tender to palpation.  Posterior tibial pulse palpable 2+ on left       Data Reviewed: I have personally reviewed following labs and imaging studies   Recent Results (from the past 240 hour(s))  SARS Coronavirus 2 (CEPHEID - Performed in [place] hospital lab), Hosp Order     Status: None   Collection Time: 12/23/18  9:36 AM  Result Value Ref Range Status   SARS Coronavirus 2 NEGATIVE NEGATIVE Final    Comment: (NOTE) If result is NEGATIVE SARS-CoV-2 target nucleic acids are NOT DETECTED. The SARS-CoV-2 RNA is generally detectable in upper and lower  respiratory specimens during the acute phase of infection. The lowest  concentration of SARS-CoV-2 viral copies this assay can detect is 250  copies / mL. A negative result does not preclude SARS-CoV-2 infection  and should not be used as the sole basis for treatment or other  patient management decisions.  A negative result may  occur with  improper specimen collection / handling, submission of specimen other  than nasopharyngeal swab, presence of viral mutation(s) within the  areas targeted by this assay, and inadequate number of viral copies  (<250 copies / mL). A negative result must be combined with clinical  observations, patient history, and epidemiological information. If result is POSITIVE SARS-CoV-2 target nucleic acids are DETECTED. The SARS-CoV-2 RNA is generally detectable in upper and lower  respiratory specimens dur ing the acute  phase of infection.  Positive  results are indicative of active infection with SARS-CoV-2.  Clinical  correlation with patient history and other diagnostic information is  necessary to determine patient infection status.  Positive results do  not rule out bacterial infection or co-infection with other viruses. If result is PRESUMPTIVE POSTIVE SARS-CoV-2 nucleic acids MAY BE PRESENT.   A presumptive positive result was obtained on the submitted specimen  and confirmed on repeat testing.  While 2019 novel coronavirus  (SARS-CoV-2) nucleic acids may be present in the submitted sample  additional confirmatory testing may be necessary for epidemiological  and / or clinical management purposes  to differentiate between  SARS-CoV-2 and other Sarbecovirus currently known to infect humans.  If clinically indicated additional testing with an alternate test  methodology [phone]) is advised. The SARS-CoV-2 RNA is generally  detectable in upper and lower respiratory sp ecimens during the acute  phase of infection. The expected result is Negative. Fact Sheet for Patients:  StrictlyIdeas.no Fact Sheet for Healthcare Providers: BankingDealers.co.za This test is not yet approved or cleared by the [place] FDA and has been authorized for detection and/or diagnosis of SARS-CoV-2 by FDA under an Emergency Use Authorization (EUA).  This  EUA will remain in effect (meaning this test can be used) for the duration of the COVID-19 declaration under Section 564(b)(1) of the Act, 21 U.S.C. section 360bbb-3(b)(1), unless the authorization is terminated or revoked sooner. Performed at [facility], [place] [address], [place], [place] [address]   Surgical PCR screen     Status: Abnormal   Collection Time: 12/23/18  7:58 PM  Result Value Ref Range Status   MRSA, PCR POSITIVE (A) NEGATIVE Final    Comment: RESULT CALLED TO, READ BACK BY AND VERIFIED WITH: M,STUTTON AT 0112 ON 12/24/18 BY A,MOHAMED    Staphylococcus aureus POSITIVE (A) NEGATIVE Final    Comment: (NOTE) The Xpert SA Assay (FDA approved for NASAL specimens in patients 42 years of age and older), is one component of a comprehensive surveillance program. It is not intended to diagnose infection nor to guide or monitor treatment. Performed at [facility], [place] [address] [place],  [address]      Liver Function Tests: Recent Labs  Lab 12/25/18 0804  AST 29  ALT 15  ALKPHOS 58  BILITOT 0.4  PROT 4.1*  ALBUMIN 1.9*   No results for input(s): LIPASE, AMYLASE in the last 168 hours. No results for input(s): AMMONIA in the last 168 hours.  Cardiac Enzymes: No results for input(s): CKTOTAL, CKMB, CKMBINDEX, TROPONINI in the last 168 hours. BNP (last 3 results) Recent Labs    02/18/18 2114  BNP 136.4*    ProBNP (last 3 results) Recent Labs    10/15/18 1017  PROBNP 150.0*      Studies: Dg Chest 2 View  Result Date: 12/26/2018 CLINICAL DATA:  Shortness of breath EXAM: CHEST - 2 VIEW COMPARISON:  12/23/2018 FINDINGS: Left pacer remains in place, unchanged. Heart is normal size. Increasing bibasilar opacities, right greater than left could reflect atelectasis or infiltrates. No visible significant effusions or acute bony abnormality. IMPRESSION: Increasing bibasilar atelectasis or infiltrates, right greater  than left. Pneumonia cannot be excluded in the right lung base. Electronically Signed   By: Rolm Baptise M.D.   On: 12/26/2018 13:20    Scheduled Meds: . sodium chloride   Intravenous Once  . calcium carbonate  1 tablet Oral BID WC  . Chlorhexidine Gluconate Cloth  6 each Topical Q0600  . docusate sodium  100 mg Oral BID  . enoxaparin (LOVENOX) injection  40 mg Subcutaneous Q24H  . folic acid  1 mg Oral Daily  . ipratropium-albuterol  3 mL Nebulization QID  . metoprolol tartrate  25 mg Oral BID  . mometasone-formoterol  2 puff Inhalation BID  . multivitamin with minerals  1 tablet Oral Daily  . mupirocin ointment  1 application Nasal BID  . thiamine  100 mg Oral Daily    Admission status: Inpatient: Based on patients clinical presentation and evaluation of above clinical data, I have made determination that patient meets Inpatient criteria at this time.  Time spent: 20 min  [facility] Pager [phone]. If 7PM-7AM, please contact night-coverage at [email or web address], Office  [phone]  password [place]  12/27/2018, 1:50 PM  LOS: 4 days

## 2018-12-27 NOTE — Progress Notes (Signed)
ABI       has been completed. Preliminary results can be found under CV proc through chart review. Renna Kilmer, BS, RDMS, RVT   

## 2018-12-27 NOTE — Progress Notes (Signed)
Pt continues to be confused throughout the day. Pt called the operator earlier in the shift and request for someone to come pick up and take away. Pt ate small bites of mid morning breakfast and lunch. Took her meds late mid morning. Spoke with pt daughter n law earlier in the shift. Pt yelling at times and using profanity. Pt currently sleeping after restless day. Arouses easily and refuses to eat dinner states "leave it there I will get later. I told you I do not want anything now." Dinner tray left at bedside. Pt return to sleep. SRP, RN

## 2018-12-28 DIAGNOSIS — J810 Acute pulmonary edema: Secondary | ICD-10-CM

## 2018-12-28 LAB — BASIC METABOLIC PANEL
Anion gap: 11 (ref 5–15)
BUN: 15 mg/dL (ref 8–23)
CO2: 24 mmol/L (ref 22–32)
Calcium: 8.5 mg/dL — ABNORMAL LOW (ref 8.9–10.3)
Chloride: 104 mmol/L (ref 98–111)
Creatinine, Ser: 0.72 mg/dL (ref 0.44–1.00)
GFR calc Af Amer: >60 mL/min (ref >60)
GFR calc non Af Amer: >60 mL/min (ref >60)
Glucose, Bld: 98 mg/dL (ref 70–99)
Potassium: 3.7 mmol/L (ref 3.5–5.1)
Sodium: 139 mmol/L (ref 135–145)

## 2018-12-28 LAB — MAGNESIUM: Magnesium: 1.9 mg/dL (ref 1.7–2.4)

## 2018-12-28 MED ORDER — FUROSEMIDE 10 MG/ML IJ SOLN
40.0000 mg | Freq: Two times a day (BID) | INTRAMUSCULAR | Status: DC
  Administered 2018-12-28 – 2018-12-30 (×4): 40 mg via INTRAVENOUS
  Filled 2018-12-28 (×5): qty 4

## 2018-12-28 NOTE — Progress Notes (Signed)
Pt continue to refuse SNF placement and states that she was ready to die. That she had did everything that she wanted to do, there is nothing else to do but die.

## 2018-12-28 NOTE — Progress Notes (Signed)
Patient still expressing thoughts of killing her self and wanting to die. Safety sitter  At bed side. Emotional support given. Patient currently refusing medication except for pain and sleep. Patient also refusing to wear prevalon boots and the placement of dressing on her toes. Will reattempt approaching  Patient about medication, boots, and dressing change for feet. Will continue to monitor patient closely.

## 2018-12-28 NOTE — Progress Notes (Signed)
Pt continues to refuse SNF.

## 2018-12-28 NOTE — Progress Notes (Signed)
Spoke with pt again about SNF vs HH. Pt is very adamant that she do not want to go to a SNF.

## 2018-12-28 NOTE — Progress Notes (Addendum)
Patient expressed wanting to die to this RN and to CM.  She additionally expressed wanting to hurt herself to RT.  RN spoke with CN who directed me to contact [facility] who arranged for suicide sitter.

## 2018-12-28 NOTE — Progress Notes (Addendum)
[facility]  PROGRESS NOTE  Jenna Marquez VPX:[id number] DOB: 1945/10/10 DOA: 12/23/2018 PCP: Darreld Mclean, MD   Brief HPI:   73 year old female with a history of anxiety, major depressive disorder, COPD, chronic respiratory failure with hypoxia, tobacco use, EtOH abuse, high-grade AV block, status post pacemaker placement, paroxysmal atrial fibrillation not on blood thinners, RBBB, CKD stage III who admitted after a fall while trying to transfer to chair and landing on right hip and arm following which she complained of leg pain.  She was unable to stand.  She was brought to ED and found to have right femur fracture.  Orthopedics was consulted.  Patient underwent IM nail on 12/24/2018.  Also found to have hemoglobin 6.5 on 12/25/2018.    Subjective   Patient seen and examined, very tearful.  Wants to go home.  Has been refusing to go to rehab.  Patient states she does not like food in the hospital, missing her friends.   Assessment/Plan:     1. Pulmonary edema-likely acute diastolic CHF, IV fluids were discontinued .Echocardiogram from 12/23/2018 showed EF 46%, diastolic function could not be assessed due to underlying A. fib.  Patient was started on Lasix 20 mg IV every 12 hours.  Net -1.4 L. .  Chest x-ray showsIncreasing bibasilar infiltrates, pneumonia cannot be excluded.  Patient is afebrile with normal WBC.  Does not appear to have pneumonia.  Likely symptoms worse from acute diastolic CHF.  Improved with Lasix.  Patient continues to be short of breath, will change the dose of Lasix to 40 mg IV every 12 hours.  2. Close displaced comminuted intertrochanteric fracture of right femur-orthopedic surgery following.  Status post IM nailing on 12/24/2018. Plan for discharge to skilled nursing facility.  3. Bluish discoloration of left 2 toes/? cyanosis-likely from injury, tender to palpation, peripheral pulses palpable at posterior tibial on left.  Dorsalis pedis was not palpable.   ABIs obtained showed moderate peripheral vascular disease in left lower extremity.  4. Acute blood loss anemia-in setting of IM nailing, also fluid overload.  Improved from yesterday, today hemoglobin is 8.7.  Follow CBC in a.m.  5. Paroxysmal atrial fibrillation-history of Mobitz type II second-degree AV block status post pacemaker-patient currently in normal sinus rhythm.  Continue metoprolol.Patient is not on anticoagulation.As per note from February 2018 from Dr. Verlon Au patient had refused anticoagulation.  6. Hypomagnesemia-Magnesium was 1.7. After replacement magnesium is 1.9.  7. Acute kidney injury-resolved, creatinine now is 0.72  Patient has been refusing care in the hospital and also refusing to go to skilled nursing facility.  Discussed with patient's son who is going to call and talk to his mom.     CBC: Recent Labs  Lab 12/23/18 0748 12/23/18 1425 12/25/18 0142 12/25/18 0804 12/25/18 1310 12/26/18 0840 12/27/18 0503  WBC 8.3 9.5 10.0  --   --  9.5 11.7*  NEUTROABS 4.9  --   --   --   --   --   --   HGB 12.4 11.0* 6.4* 6.5* 9.1* 8.1* 8.7*  HCT 37.3 33.7* 21.1* 20.3* 27.1* 25.0* 27.3*  MCV 114.1* 115.8* 121.3*  --   --  107.3* 107.1*  PLT 217 198 149*  --   --  171 270    Basic Metabolic Panel: Recent Labs  Lab 12/24/18 0355 12/25/18 0142 12/26/18 0449 12/27/18 0503 12/28/18 0522  NA 139 140 136 139 139  K 4.2 3.8 4.5 3.9 3.7  CL 106 117* 104 106 104  CO2 25 18* 25 23 24   GLUCOSE 128* 110* 101* 97 98  BUN 18 13 23 14 15   CREATININE 0.91 0.65 1.24* 0.74 0.72  CALCIUM 7.7* 6.0* 8.9 8.7* 8.5*  MG 1.3* 1.7  --   --  1.9     DVT prophylaxis: Lovenox  Code Status: DNR  Family Communication: Called and discussed with patient's son  Disposition Plan: Skilled nursing facility     Consultants:  Orthopedics  Procedures:  IM nail for femur fracture   Antibiotics:   Anti-infectives (From admission, onward)   Start     Dose/Rate Route  Frequency Ordered Stop   12/24/18 2000  ceFAZolin (ANCEF) IVPB 2g/100 mL premix     2 g 200 mL/hr over 30 Minutes Intravenous Every 6 hours 12/24/18 1604 12/25/18 0317   12/24/18 1230  vancomycin (VANCOCIN) IVPB 1000 mg/200 mL premix     1,000 mg 200 mL/hr over 60 Minutes Intravenous  Once 12/24/18 0805 12/24/18 1337   12/24/18 0815  ceFAZolin (ANCEF) IVPB 2g/100 mL premix     2 g 200 mL/hr over 30 Minutes Intravenous On call to O.R. 12/24/18 0805 12/24/18 1343       Objective   Vitals:   12/28/18 0451 12/28/18 0806 12/28/18 1429 12/28/18 1531  BP: (!) 135/57  (!) 162/100   Pulse: (!) 102 81 84   Resp: 16 (!) 22 19   Temp: 98.3 F (36.8 C)  98.3 F (36.8 C)   TempSrc: Oral  Oral   SpO2: 100% (!) 85% 95% 98%  Weight:      Height:        Intake/Output Summary (Last 24 hours) at 12/28/2018 1541 Last data filed at 12/28/2018 1443 Gross per 24 hour  Intake 0.11 ml  Output 750 ml  Net -749.89 ml   Filed Weights   12/23/18 1511 12/24/18 0500 12/27/18 0533  Weight: 62.8 kg 65.2 kg 66.4 kg     Physical Examination:   General: Appears tearful and depressed  Cardiovascular: S1-S2, regular  Respiratory: Bilateral rhonchi  Abdomen: Abdomen is soft, nontender, no organomegaly  Extremities: No edema in the lower extremities, dusky bluish coloration in left third and fourth toes  Neurologic: Alert, oriented x3, no focal deficit noted.        Data Reviewed: I have personally reviewed following labs and imaging studies   Recent Results (from the past 240 hour(s))  SARS Coronavirus 2 (CEPHEID - Performed in [place] hospital lab), Hosp Order     Status: None   Collection Time: 12/23/18  9:36 AM  Result Value Ref Range Status   SARS Coronavirus 2 NEGATIVE NEGATIVE Final    Comment: (NOTE) If result is NEGATIVE SARS-CoV-2 target nucleic acids are NOT DETECTED. The SARS-CoV-2 RNA is generally detectable in upper and lower  respiratory specimens during the acute  phase of infection. The lowest  concentration of SARS-CoV-2 viral copies this assay can detect is 250  copies / mL. A negative result does not preclude SARS-CoV-2 infection  and should not be used as the sole basis for treatment or other  patient management decisions.  A negative result may occur with  improper specimen collection / handling, submission of specimen other  than nasopharyngeal swab, presence of viral mutation(s) within the  areas targeted by this assay, and inadequate number of viral copies  (<250 copies / mL). A negative result must be combined with clinical  observations, patient history, and epidemiological information. If result is POSITIVE SARS-CoV-2 target  nucleic acids are DETECTED. The SARS-CoV-2 RNA is generally detectable in upper and lower  respiratory specimens dur ing the acute phase of infection.  Positive  results are indicative of active infection with SARS-CoV-2.  Clinical  correlation with patient history and other diagnostic information is  necessary to determine patient infection status.  Positive results do  not rule out bacterial infection or co-infection with other viruses. If result is PRESUMPTIVE POSTIVE SARS-CoV-2 nucleic acids MAY BE PRESENT.   A presumptive positive result was obtained on the submitted specimen  and confirmed on repeat testing.  While 2019 novel coronavirus  (SARS-CoV-2) nucleic acids may be present in the submitted sample  additional confirmatory testing may be necessary for epidemiological  and / or clinical management purposes  to differentiate between  SARS-CoV-2 and other Sarbecovirus currently known to infect humans.  If clinically indicated additional testing with an alternate test  methodology [phone]) is advised. The SARS-CoV-2 RNA is generally  detectable in upper and lower respiratory sp ecimens during the acute  phase of infection. The expected result is Negative. Fact Sheet for Patients:   StrictlyIdeas.no Fact Sheet for Healthcare Providers: BankingDealers.co.za This test is not yet approved or cleared by the [place] FDA and has been authorized for detection and/or diagnosis of SARS-CoV-2 by FDA under an Emergency Use Authorization (EUA).  This EUA will remain in effect (meaning this test can be used) for the duration of the COVID-19 declaration under Section 564(b)(1) of the Act, 21 U.S.C. section 360bbb-3(b)(1), unless the authorization is terminated or revoked sooner. Performed at [facility], [place] [address], [place], [place] [address]   Surgical PCR screen     Status: Abnormal   Collection Time: 12/23/18  7:58 PM  Result Value Ref Range Status   MRSA, PCR POSITIVE (A) NEGATIVE Final    Comment: RESULT CALLED TO, READ BACK BY AND VERIFIED WITH: M,STUTTON AT 0112 ON 12/24/18 BY A,MOHAMED    Staphylococcus aureus POSITIVE (A) NEGATIVE Final    Comment: (NOTE) The Xpert SA Assay (FDA approved for NASAL specimens in patients 26 years of age and older), is one component of a comprehensive surveillance program. It is not intended to diagnose infection nor to guide or monitor treatment. Performed at [facility], [place] [address], [place],  [address]      Liver Function Tests: Recent Labs  Lab 12/25/18 0804  AST 29  ALT 15  ALKPHOS 58  BILITOT 0.4  PROT 4.1*  ALBUMIN 1.9*   No results for input(s): LIPASE, AMYLASE in the last 168 hours. No results for input(s): AMMONIA in the last 168 hours.  Cardiac Enzymes: No results for input(s): CKTOTAL, CKMB, CKMBINDEX, TROPONINI in the last 168 hours. BNP (last 3 results) Recent Labs    02/18/18 2114  BNP 136.4*    ProBNP (last 3 results) Recent Labs    10/15/18 1017  PROBNP 150.0*      Studies: Vas [place] Abi With/wo Tbi  Result Date: 12/27/2018 LOWER EXTREMITY DOPPLER STUDY Indications: Left toes  discoloration.  Performing Technologist: June Leap RDMS, RVT  Examination Guidelines: A complete evaluation includes at minimum, Doppler waveform signals and systolic blood pressure reading at the level of bilateral brachial, anterior tibial, and posterior tibial arteries, when vessel segments are accessible. Bilateral testing is considered an integral part of a complete examination. Photoelectric Plethysmograph (PPG) waveforms and toe systolic pressure readings are included as required and additional duplex testing as needed. Limited examinations for reoccurring indications  may be performed as noted.  ABI Findings: +--------+------------------+-----+---------+----------------+ Right   Rt Pressure (mmHg)IndexWaveform Comment          +--------+------------------+-----+---------+----------------+ [id number]                    triphasic                 +--------+------------------+-----+---------+----------------+ ATA     131               0.98 biphasic                  +--------+------------------+-----+---------+----------------+ PTA     255               1.92 biphasic non compressible +--------+------------------+-----+---------+----------------+ +--------+------------------+-----+----------+---------------------------------+ Left    Lt Pressure (mmHg)IndexWaveform  Comment                           +--------+------------------+-----+----------+---------------------------------+ Brachial                                 unable to obtain BP due to                                                 patient position                  +--------+------------------+-----+----------+---------------------------------+ ATA     71                0.53 monophasic                                  +--------+------------------+-----+----------+---------------------------------+ PTA     82                0.62 monophasic                                   +--------+------------------+-----+----------+---------------------------------+ Unable to obtain toe waveforms/ pressures due to patient in pain when touched.  Summary: Right: Resting right ankle-brachial index indicates noncompressible right lower extremity arteries. Left: Resting left ankle-brachial index indicates moderate left lower extremity arterial disease.  *See table(s) above for measurements and observations.  Electronically signed by Monica Martinez MD on 12/27/2018 at 4:26:42 PM.   Final     Scheduled Meds: . sodium chloride   Intravenous Once  . calcium carbonate  1 tablet Oral BID WC  . docusate sodium  100 mg Oral BID  . enoxaparin (LOVENOX) injection  40 mg Subcutaneous Q24H  . folic acid  1 mg Oral Daily  . furosemide  40 mg Intravenous Q12H  . ipratropium-albuterol  3 mL Nebulization QID  . metoprolol tartrate  25 mg Oral BID  . mometasone-formoterol  2 puff Inhalation BID  . multivitamin with minerals  1 tablet Oral Daily  . mupirocin ointment  1 application Nasal BID  . thiamine  100 mg Oral Daily    Admission status: Inpatient: Based on patients clinical presentation and evaluation of above clinical data, I have made determination that patient meets Inpatient criteria at this time.  Time spent: 20 min  [place]  [facility] Pager [phone]. If 7PM-7AM, please contact night-coverage at [email or web address], Office  [phone]  password [email or web address]  12/28/2018, 3:41 PM  LOS: 5 days

## 2018-12-28 NOTE — Progress Notes (Signed)
RT came to administer breathing treatment and patient found to be 85% on RA. BBS exp wheezing and crackles. [place] at 2 L placed back on patient post-treatment. Bedside RN made aware. RT will continue to monitor patient.

## 2018-12-28 NOTE — Progress Notes (Signed)
When this RT patient came to see patient for her 1200 scheduled treatment, patient very tearful and this RT attempted to console patient and listen to her. Patient at one point stated " If I was at home, I would get my gun and just shoot myself". When RT attempted to further console her she stated "she is not living like this and I will do something big if I don't get out of here soon". Patient also concerned that we are "kidnapping" her and holding her against her will. At the time of this encounter, bedside RN and bedside CNA made aware of the situation. Chaplain paged by this RT in attempt to lend patient further emotional support.

## 2018-12-28 NOTE — Progress Notes (Signed)
Spoke with Dr. Darrick Meigs about patient being tearful, which he had also witnessed.  I told him about patient expressing desire to die.

## 2018-12-28 NOTE — Progress Notes (Signed)
Patient refused dressing change on feet. Patient kicked her feet and refused to let me touch her feet. Attempted three times.Pt told me to " that she would worry about her feet in later in the morning"   Pt has been confused throughout the night. Pt have been agreeable to take medication after education. Patient states after medication " the doctors are just giving me these meds to keep me in the hospital"  Patients vitals are stable. Patients pulses in feet intact and warm. Will continue to monitor patient.

## 2018-12-28 NOTE — Progress Notes (Signed)
Physical Therapy Treatment Patient Details Name: Jenna Marquez MRN: [id number] DOB: May 08, 1946 Today's Date: 12/28/2018    History of Present Illness 73 y.o. BF PMHx anxiety, major depressive disorder, COPD, chronic respiratory failure with hypoxia, tobacco abuse, EtOH abuse, high-grade AV block S/P pacemaker, paroxysmal atrial fibrillation not on blood thinners), RBBB, CKD stage III who was ambulatory at baseline living with her niece admitted after a fall while trying to transfer to chair. Dx of R IT fracture of femur, s/p IM nail, possible R partial quad tendon rupture    PT Comments    Pt very tearful this session, but willing to mobilize to recliner. Pt also much more polite to PT this session vs last. Pt continuing to require max assist +2 for all mobility, and is limited to transfers to recliner at this time. Pt spoke with pt's nephew per pt request, and pt's nephew fears pt is becoming socially isolated and would like pt to have the opportunity to leave her hospital room. PT mobilized pt in hallway with use of recliner, and pt performed exercises during hallway mobility as an "outing". PT to continue to follow acutely, will progress mobility as able.   Follow Up Recommendations  SNF;Supervision/Assistance - 24 hour     Equipment Recommendations  Rolling walker with 5" wheels;3in1 (PT)    Recommendations for Other Services       Precautions / Restrictions Precautions Precautions: Fall Required Braces or Orthoses: Knee Immobilizer - Right Knee Immobilizer - Right: On at all times Restrictions Weight Bearing Restrictions: No RLE Weight Bearing: Weight bearing as tolerated Other Position/Activity Restrictions: WBAT    Mobility  Bed Mobility Overal bed mobility: Needs Assistance Bed Mobility: Supine to Sit     Supine to sit: Max assist;+2 for physical assistance;+2 for safety/equipment;HOB elevated     General bed mobility comments: Max assist +2 for trunk elevation, LE  management, and scootign to EOB with use of bed pad. Pt vocalizing in pain with mobility, improved with sitting EOB.   Transfers Overall transfer level: Needs assistance Equipment used: 2 person hand held assist Transfers: Sit to/from [facility] Sit to Stand: +2 physical assistance;Max assist Stand pivot transfers: Max assist;+2 physical assistance       General transfer comment: Max assist +2 for sit to stand to power up, steady, and transfer to recliner at bedside with small steps. Pt with forward flexed posture, and with LLE buckling. PT and PT aide had to guide pt to recliner and pt stating the whole time "I am falling"  Ambulation/Gait Ambulation/Gait assistance: (NT)               Stairs             Wheelchair Mobility    Modified Rankin (Stroke Patients Only)       Balance Overall balance assessment: Needs assistance Sitting-balance support: Feet supported;Bilateral upper extremity supported Sitting balance-Leahy Scale: Poor       Standing balance-Leahy Scale: Zero Standing balance comment: dependent on PT and PT aide for support                            Cognition Arousal/Alertness: Awake/alert Behavior During Therapy: WFL for tasks assessed/performed Overall Cognitive Status: Impaired/Different from baseline Area of Impairment: Following commands;Safety/judgement;Problem solving;Memory                       Following Commands: Follows one step commands consistently;Follows  one step commands with increased time Safety/Judgement: Decreased awareness of safety;Decreased awareness of deficits   Problem Solving: Slow processing;Difficulty sequencing;Requires tactile cues;Requires verbal cues General Comments: Pt very tearful this session, stating "they are keeping me in the hospital for no reason, nothing is wrong with me". Pt expressing desire to d/c home.      Exercises General Exercises - Lower  Extremity Ankle Circles/Pumps: AROM;Both;20 reps;Seated Shoulder Exercises Shoulder Flexion: AROM;Both;10 reps;AAROM;Seated    General Comments        Pertinent Vitals/Pain Pain Assessment: Faces Faces Pain Scale: Hurts whole lot Pain Location: RLE with movement Pain Descriptors / Indicators: Guarding;Grimacing;Crying Pain Intervention(s): Limited activity within patient's tolerance;Monitored during session;Repositioned    Home Living                      Prior Function            PT Goals (current goals can now be found in the care plan section) Acute Rehab PT Goals Patient Stated Goal: to walk PT Goal Formulation: With patient Time For Goal Achievement: 01/08/19 Potential to Achieve Goals: Good Progress towards PT goals: Progressing toward goals    Frequency    Min 3X/week      PT Plan Current plan remains appropriate    Co-evaluation              AM-PAC PT "6 Clicks" Mobility   Outcome Measure  Help needed turning from your back to your side while in a flat bed without using bedrails?: Total Help needed moving from lying on your back to sitting on the side of a flat bed without using bedrails?: Total Help needed moving to and from a bed to a chair (including a wheelchair)?: Total Help needed standing up from a chair using your arms (e.g., wheelchair or bedside chair)?: Total Help needed to walk in hospital room?: Total Help needed climbing 3-5 steps with a railing? : Total 6 Click Score: 6    End of Session Equipment Utilized During Treatment: Oxygen;Right knee immobilizer Activity Tolerance: Patient limited by pain Patient left: in bed;with call bell/phone within reach;with nursing/sitter in room Nurse Communication: Mobility status PT Visit Diagnosis: History of falling (Z91.81);Difficulty in walking, not elsewhere classified (R26.2);Pain Pain - Right/Left: Right Pain - part of body: Hip     Time: 1700-1749 PT Time Calculation (min)  (ACUTE ONLY): 35 min  Charges:  $Therapeutic Exercise: 8-22 mins $Therapeutic Activity: 8-22 mins                    Darreon Lutes Conception Chancy, PT Acute Rehabilitation Services Pager [phone]  Office [phone]    Arian Mcquitty D Elonda Husky 12/28/2018, 12:57 PM

## 2018-12-29 DIAGNOSIS — I480 Paroxysmal atrial fibrillation: Secondary | ICD-10-CM

## 2018-12-29 DIAGNOSIS — N182 Chronic kidney disease, stage 2 (mild): Secondary | ICD-10-CM

## 2018-12-29 DIAGNOSIS — J449 Chronic obstructive pulmonary disease, unspecified: Secondary | ICD-10-CM

## 2018-12-29 DIAGNOSIS — S7291XA Unspecified fracture of right femur, initial encounter for closed fracture: Secondary | ICD-10-CM

## 2018-12-29 DIAGNOSIS — M79676 Pain in unspecified toe(s): Secondary | ICD-10-CM

## 2018-12-29 LAB — BASIC METABOLIC PANEL
Anion gap: 12 (ref 5–15)
BUN: 16 mg/dL (ref 8–23)
CO2: 27 mmol/L (ref 22–32)
Calcium: 8.9 mg/dL (ref 8.9–10.3)
Chloride: 100 mmol/L (ref 98–111)
Creatinine, Ser: 0.87 mg/dL (ref 0.44–1.00)
GFR calc Af Amer: >60 mL/min (ref >60)
GFR calc non Af Amer: >60 mL/min (ref >60)
Glucose, Bld: 107 mg/dL — ABNORMAL HIGH (ref 70–99)
Potassium: 3.5 mmol/L (ref 3.5–5.1)
Sodium: 139 mmol/L (ref 135–145)

## 2018-12-29 MED ORDER — IPRATROPIUM-ALBUTEROL 0.5-2.5 (3) MG/3ML IN SOLN
3.0000 mL | Freq: Three times a day (TID) | RESPIRATORY_TRACT | Status: DC
  Administered 2018-12-29 – 2019-01-01 (×9): 3 mL via RESPIRATORY_TRACT
  Filled 2018-12-29 (×10): qty 3

## 2018-12-29 MED ORDER — POTASSIUM CHLORIDE CRYS ER 20 MEQ PO TBCR
40.0000 meq | EXTENDED_RELEASE_TABLET | Freq: Once | ORAL | Status: AC
  Administered 2018-12-29: 40 meq via ORAL
  Filled 2018-12-29: qty 2

## 2018-12-29 NOTE — Consult Note (Signed)
Referring Physician: Dr Darrick Meigs  Patient name: Jenna Marquez MRN: [id number] DOB: 07-22-1946 Sex: female  REASON FOR CONSULT: ischemic toes left foot  HPI: Jenna Marquez is a 73 y.o. female,  Admitted with right hip fracture.  Noted during this admission to have dark colored toes left foot this admission.  She is confused and can't provide much history.  Pt nurse says it only hurts when they touch it.  Former smokere quit 10 years ago.   Other medical problems include paroxysmal afib, COPD CkD 2.  Past Medical History:  Diagnosis Date  . Abnormal mammogram 07/20/2011  . Acute respiratory failure with hypoxia ([place]) 09/30/2016  . Alcohol abuse 02/19/2018  . ALLERGIC RHINITIS 10/13/2010   Qualifier: Diagnosis of  By: Ronnald Ramp CMA, [place]    . Anxiety    "occasionally"  . ASTHMA 10/13/2010   Qualifier: Diagnosis of  By: Ronnald Ramp CMA, Chemira    . Blunt trauma of nose 06/06/2012  . Chest pain 08/18/2011  . Chest pain on breathing 05/11/2015  . Chronic respiratory failure with hypoxia ([place]) 04/24/2018  . CKD (chronic kidney disease) stage 2, GFR 60-89 ml/min 09/30/2016  . COPD exacerbation ([place]) 08/16/2013  . E. coli UTI 02/22/2018  . Elevated BP 02/23/2012  . Generalized anxiety disorder 01/11/2012  . GERD (gastroesophageal reflux disease)   . Head injury 10/23/2012  . 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 Hemoptysis 02/19/2018  . 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 HTN (hypertension) 11/24/2016  . Indigestion 04/23/2012  . Left knee pain 07/11/2017  . Left shoulder pain 05/22/2015  . Lumbar radicular pain 06/13/2012  . MDD (major depressive disorder), single episode, severe , no psychosis ([place])   . Pacemaker 04/22/2015  . Palpitations 08/16/2013  . Paroxysmal atrial fibrillation ([place]) 09/30/2016  . Pneumonia 02/19/2018  . PVD (peripheral vascular disease) ([place]) 04/04/2017  . RBBB (right bundle branch block) 07/31/2011  . Swelling of arm 05/22/2015  . Swollen uvula 11/14/2014  . Tinnitus of both ears  10/23/2012  . Tobacco abuse    QUIT 12/13   . Vagina bleeding 02/19/2018  . Vitamin D deficiency 01/30/2013   Past Surgical History:  Procedure Laterality Date  . [place]  . EP IMPLANTABLE DEVICE N/A 01/07/2015   Procedure: Pacemaker Implant;  Surgeon: Evans Lance, MD;  Location: [facility];  Service: Cardiovascular;  Laterality: N/A;  . INTRAMEDULLARY (IM) NAIL INTERTROCHANTERIC Right 12/24/2018   Procedure: RIGHT INTRAMEDULLARY (IM) NAIL INTERTROCHANTRIC;  Surgeon: Renette Butters, MD;  Location: [facility];  Service: Orthopedics;  Laterality: Right;  . PACEMAKER INSERTION    . TONSILLECTOMY AND ADENOIDECTOMY  1954    Family History  Problem Relation Age of Onset  . Coronary artery disease Mother   . Heart disease Mother        CHF  . Congestive Heart Failure Mother   . Diabetes Neg Hx   . Cancer Neg Hx   . Stroke Neg Hx   . Colon cancer Neg Hx     SOCIAL HISTORY: Social History   Socioeconomic History  . Marital status: Widowed    Spouse name: Not on file  . Number of children: 1  . Years of education: Not on file  . Highest education level: Not on file  Occupational History  . Occupation: Retired    Fish farm manager: [employer]    Comment: Production manager  Social Needs  . Financial resource strain: Not on file  . Food  insecurity:    Worry: Not on file    Inability: Not on file  . Transportation needs:    Medical: Not on file    Non-medical: Not on file  Tobacco Use  . Smoking status: Former Smoker    Packs/day: 1.00    Years: 35.00    Pack years: 35.00    Types: Cigarettes    Last attempt to quit: 08/10/2012    Years since quitting: 6.3  . Smokeless tobacco: Never Used  . Tobacco comment: Started at age 1  Substance and Sexual Activity  . Alcohol use: Yes    Alcohol/week: 8.0 standard drinks    Types: 8 Glasses of wine per week  . Drug use: No  . Sexual activity: Not Currently    Birth control/protection: Post-menopausal   Lifestyle  . Physical activity:    Days per week: Not on file    Minutes per session: Not on file  . Stress: Not on file  Relationships  . Social connections:    Talks on phone: Not on file    Gets together: Not on file    Attends religious service: Not on file    Active member of club or organization: Not on file    Attends meetings of clubs or organizations: Not on file    Relationship status: Not on file  . Intimate partner violence:    Fear of current or ex partner: Not on file    Emotionally abused: Not on file    Physically abused: Not on file    Forced sexual activity: Not on file  Other Topics Concern  . Not on file  Social History Narrative   Lost husband in 12-2011, she lost her sister in 2012, brother in July 2013. She lives by herself, God son stayed with her. She retired as Radio producer.    Allergies  Allergen Reactions  . Avelox [Moxifloxacin Hcl In Nacl] Itching and Rash    Current Facility-Administered Medications  Medication Dose Route Frequency Provider Last Rate Last Dose  . 0.9 %  sodium chloride infusion (Manually program via Guardrails IV Fluids)   Intravenous Once Kirby-Graham, Karsten Fells, NP      . 0.9 %  sodium chloride infusion   Intravenous Continuous Oswald Hillock, MD   Stopped at 12/26/18 1100  . acetaminophen (TYLENOL) tablet 325-650 mg  325-650 mg Oral Q6H PRN Prudencio Burly III, PA-C   650 mg at 12/27/18 1201  . albuterol (PROVENTIL) (2.5 MG/3ML) 0.083% nebulizer solution 2.5 mg  2.5 mg Nebulization Q6H PRN Prudencio Burly III, PA-C      . alum & mag hydroxide-simeth (MAALOX/MYLANTA) 200-200-20 MG/5ML suspension 15 mL  15 mL Oral Q6H PRN Oswald Hillock, MD   15 mL at 12/26/18 1845  . bisacodyl (DULCOLAX) suppository 10 mg  10 mg Rectal Daily PRN Prudencio Burly III, PA-C      . calcium carbonate (OS-CAL - dosed in mg of elemental calcium) tablet 500 mg of elemental calcium  1 tablet Oral BID WC Kc, Ramesh, MD   500 mg of  elemental calcium at 12/29/18 1028  . docusate sodium (COLACE) capsule 100 mg  100 mg Oral BID Prudencio Burly III, PA-C   100 mg at 12/29/18 1029  . enoxaparin (LOVENOX) injection 40 mg  40 mg Subcutaneous Q24H Prudencio Burly III, PA-C   40 mg at 12/29/18 1028  . folic acid (FOLVITE) tablet 1 mg  1 mg Oral Daily Wofford,  Drew A, RPH   1 mg at 12/29/18 1028  . furosemide (LASIX) injection 40 mg  40 mg Intravenous Q12H Oswald Hillock, MD   40 mg at 12/29/18 1027  . guaiFENesin-dextromethorphan (ROBITUSSIN DM) 100-10 MG/5ML syrup 5 mL  5 mL Oral Q4H PRN Prudencio Burly III, PA-C   5 mL at 12/23/18 1948  . HYDROcodone-acetaminophen (NORCO/VICODIN) 5-325 MG per tablet 1-2 tablet  1-2 tablet Oral Q6H PRN Prudencio Burly III, PA-C   2 tablet at 12/29/18 [phone]  . HYDROmorphone (DILAUDID) injection 0.25-0.5 mg  0.25-0.5 mg Intravenous Q4H PRN Prudencio Burly III, PA-C   0.5 mg at 12/25/18 1248  . ipratropium (ATROVENT) nebulizer solution 0.5 mg  0.5 mg Nebulization Q6H PRN Prudencio Burly III, PA-C   0.5 mg at 12/23/18 1421  . ipratropium-albuterol (DUONEB) 0.5-2.5 (3) MG/3ML nebulizer solution 3 mL  3 mL Nebulization TID Oswald Hillock, MD      . LORazepam (ATIVAN) injection 2-3 mg  2-3 mg Intravenous Q1H PRN Prudencio Burly III, PA-C   2 mg at 12/29/18 0028  . methocarbamol (ROBAXIN) 500 mg in dextrose 5 % 50 mL IVPB  500 mg Intravenous Q8H PRN Prudencio Burly III, PA-C   Stopped at 12/24/18 0106  . metoCLOPramide (REGLAN) tablet 5-10 mg  5-10 mg Oral Q8H PRN Prudencio Burly III, PA-C       Or  . metoCLOPramide (REGLAN) injection 5-10 mg  5-10 mg Intravenous Q8H PRN Prudencio Burly III, PA-C      . metoprolol tartrate (LOPRESSOR) tablet 25 mg  25 mg Oral BID Prudencio Burly III, PA-C   25 mg at 12/29/18 1029  . mometasone-formoterol (DULERA) 200-5 MCG/ACT inhaler 2 puff  2 puff Inhalation BID Prudencio Burly III,  PA-C   2 puff at 12/29/18 0731  . multivitamin with minerals tablet 1 tablet  1 tablet Oral Daily Prudencio Burly III, PA-C   1 tablet at 12/29/18 1036  . ondansetron (ZOFRAN) tablet 4 mg  4 mg Oral Q6H PRN Prudencio Burly III, PA-C   4 mg at 12/27/18 1201   Or  . ondansetron (ZOFRAN) injection 4 mg  4 mg Intravenous Q6H PRN Prudencio Burly III, PA-C   4 mg at 12/25/18 1511  . polyethylene glycol (MIRALAX / GLYCOLAX) packet 17 g  17 g Oral Daily PRN Prudencio Burly III, PA-C   17 g at 12/26/18 4010  . sodium phosphate (FLEET) 7-19 GM/118ML enema 1 enema  1 enema Rectal Once PRN Prudencio Burly III, PA-C      . thiamine (VITAMIN B-1) tablet 100 mg  100 mg Oral Daily Reuel Boom A, RPH   100 mg at 12/29/18 1028  . traZODone (DESYREL) tablet 25 mg  25 mg Oral QHS PRN Prudencio Burly III, PA-C   25 mg at 12/28/18 2100    ROS:   Unable to obtain pt confused  Physical Examination  Vitals:   12/29/18 0554 12/29/18 0732 12/29/18 1219 12/29/18 1446  BP: (!) 142/54   125/60  Pulse: 73   92  Resp: 18   18  Temp: 99.6 F (37.6 C)   98.9 F (37.2 C)  TempSrc: Oral   Oral  SpO2: 90% 93% 95% 98%  Weight:      Height:        Body mass index is 23.2 kg/m.  General:  Alert and oriented, no acute distress HEENT: Normal Neck:  No bruit or JVD Pulmonary: Clear to auscultation bilaterally Cardiac: Regular Rate and Rhythm without murmur Abdomen: Soft, non-tender Skin: No rash, left 2nd 3rd toe tip dry ischemic looking changes Extremity Pulses:  2+ radial, brachial, femoral,absent dorsalis pedis, posterior tibial pulses bilaterally Musculoskeletal: No deformity or edema  Neurologic: Upper and lower extremity motor 5/5 and symmetric disoriented  DATA:  ABI left .6, right non compressible  CBC    Component Value Date/Time   WBC 11.7 (H) 12/27/2018 0503   RBC 2.55 (L) 12/27/2018 0503   HGB 8.7 (L) 12/27/2018 0503   HCT 27.3 (L) 12/27/2018  0503   PLT 233 12/27/2018 0503   MCV 107.1 (H) 12/27/2018 0503   MCH 34.1 (H) 12/27/2018 0503   MCHC 31.9 12/27/2018 0503   RDW 19.5 (H) 12/27/2018 0503   LYMPHSABS 1.7 12/23/2018 0748   MONOABS 1.4 (H) 12/23/2018 0748   EOSABS 0.2 12/23/2018 0748   BASOSABS 0.1 12/23/2018 0748    BMET    Component Value Date/Time   NA 139 12/29/2018 0550   K 3.5 12/29/2018 0550   CL 100 12/29/2018 0550   CO2 27 12/29/2018 0550   GLUCOSE 107 (H) 12/29/2018 0550   BUN 16 12/29/2018 0550   CREATININE 0.87 12/29/2018 0550   CALCIUM 8.9 12/29/2018 0550   GFRNONAA >60 12/29/2018 0550   GFRAA >60 12/29/2018 0550     ASSESSMENT:  Pt currently fairly debilitated from right femur injury and delirium.  Will consider agram if she makes some recovery and can participate in her care   PLAN:  Will arrange out follow up with me in 2 weeks to reevaluate mental status and condition of toe Will get arterial duplex at that visit  Would recommend aspirin statin if ok with primary team   Ruta Hinds, MD [facility]: [phone] Pager: [phone]

## 2018-12-29 NOTE — Progress Notes (Signed)
This RN has assumed care over the pt at this time. Agree with previous nurse's assessment. Pt sleeping in bed.

## 2018-12-29 NOTE — Progress Notes (Signed)
[facility]  PROGRESS NOTE  Jenna Marquez WUX:[id number] DOB: 20-Jan-1946 DOA: 12/23/2018 PCP: Darreld Mclean, MD   Brief HPI:   73 year old female with a history of anxiety, major depressive disorder, COPD, chronic respiratory failure with hypoxia, tobacco use, EtOH abuse, high-grade AV block, status post pacemaker placement, paroxysmal atrial fibrillation not on blood thinners, RBBB, CKD stage III who admitted after a fall while trying to transfer to chair and landing on right hip and arm following which she complained of leg pain.  She was unable to stand.  She was brought to ED and found to have right femur fracture.  Orthopedics was consulted.  Patient underwent IM nail on 12/24/2018.  Also found to have hemoglobin 6.5 on 12/25/2018.    Subjective   Patient seen and examined, feels better this morning.  Denies shortness of breath.   Assessment/Plan:     1. Pulmonary edema-likely acute diastolic CHF, IV fluids were discontinued .Echocardiogram from 12/23/2018 showed EF 25%, diastolic function could not be assessed due to underlying A. fib.  Patient was started on Lasix 40 mg IV every 12 hours.  Net 0.7 L.  In past 24 hours.  Still +4.2 L overload.  Chest x-ray showsIncreasing bibasilar infiltrates, pneumonia.  Patient is afebrile.  Clinically does not appear to have pneumonia.  2. Close displaced comminuted intertrochanteric fracture of right femur-orthopedic surgery following.  Status post IM nailing on 12/24/2018. Plan for discharge to skilled nursing facility.  3. Bluish discoloration of left 2 toes/? cyanosis-likely from injury, tender to palpation, peripheral pulses palpable at posterior tibial on left.  Dorsalis pedis was not palpable.  ABIs obtained showed moderate peripheral vascular disease in left lower extremity.  4. Acute blood loss anemia-in setting of IM nailing, also fluid overload.  Improved from yesterday, today hemoglobin is 8.7.  Follow CBC in  a.m.  5. Paroxysmal atrial fibrillation-history of Mobitz type II second-degree AV block status post pacemaker-patient currently in normal sinus rhythm.  Continue metoprolol.Patient is not on anticoagulation.As per note from February 2018 from Dr. Verlon Au patient had refused anticoagulation.  6. Hypomagnesemia-Magnesium was 1.7. After replacement magnesium is 1.9.  7. Acute kidney injury-resolved, creatinine now is 0.72  Patient has been refusing care in the hospital and also refusing to go to skilled nursing facility.  Discussed with patient's son who is going to call and talk to his mom.     CBC: Recent Labs  Lab 12/23/18 0748 12/23/18 1425 12/25/18 0142 12/25/18 0804 12/25/18 1310 12/26/18 0840 12/27/18 0503  WBC 8.3 9.5 10.0  --   --  9.5 11.7*  NEUTROABS 4.9  --   --   --   --   --   --   HGB 12.4 11.0* 6.4* 6.5* 9.1* 8.1* 8.7*  HCT 37.3 33.7* 21.1* 20.3* 27.1* 25.0* 27.3*  MCV 114.1* 115.8* 121.3*  --   --  107.3* 107.1*  PLT 217 198 149*  --   --  171 366    Basic Metabolic Panel: Recent Labs  Lab 12/24/18 0355 12/25/18 0142 12/26/18 0449 12/27/18 0503 12/28/18 0522 12/29/18 0550  NA 139 140 136 139 139 139  K 4.2 3.8 4.5 3.9 3.7 3.5  CL 106 117* 104 106 104 100  CO2 25 18* 25 23 24 27   GLUCOSE 128* 110* 101* 97 98 107*  BUN 18 13 23 14 15 16   CREATININE 0.91 0.65 1.24* 0.74 0.72 0.87  CALCIUM 7.7* 6.0* 8.9 8.7* 8.5* 8.9  MG 1.3* 1.7  --   --  1.9  --      DVT prophylaxis: Lovenox  Code Status: DNR  Family Communication: Called and discussed with patient's son  Disposition Plan: Skilled nursing facility     Consultants:  Orthopedics  Procedures:  IM nail for femur fracture   Antibiotics:   Anti-infectives (From admission, onward)   Start     Dose/Rate Route Frequency Ordered Stop   12/24/18 2000  ceFAZolin (ANCEF) IVPB 2g/100 mL premix     2 g 200 mL/hr over 30 Minutes Intravenous Every 6 hours 12/24/18 1604 12/25/18 0317    12/24/18 1230  vancomycin (VANCOCIN) IVPB 1000 mg/200 mL premix     1,000 mg 200 mL/hr over 60 Minutes Intravenous  Once 12/24/18 0805 12/24/18 1337   12/24/18 0815  ceFAZolin (ANCEF) IVPB 2g/100 mL premix     2 g 200 mL/hr over 30 Minutes Intravenous On call to O.R. 12/24/18 0805 12/24/18 1343       Objective   Vitals:   12/29/18 0554 12/29/18 0732 12/29/18 1219 12/29/18 1446  BP: (!) 142/54   125/60  Pulse: 73   92  Resp: 18   18  Temp: 99.6 F (37.6 C)   98.9 F (37.2 C)  TempSrc: Oral   Oral  SpO2: 90% 93% 95% 98%  Weight:      Height:        Intake/Output Summary (Last 24 hours) at 12/29/2018 1528 Last data filed at 12/29/2018 3343 Gross per 24 hour  Intake 240 ml  Output 950 ml  Net -710 ml   Filed Weights   12/24/18 0500 12/27/18 0533 12/29/18 0527  Weight: 65.2 kg 66.4 kg 66.2 kg     Physical Examination: General-appears in no acute distress Heart-S1-S2, regular, no murmur auscultated Lungs-clear to auscultation bilaterally, no wheezing or crackles auscultated Abdomen-soft, nontender, no organomegaly Extremities-no edema in the lower extremities Neuro-alert, oriented x3, no focal deficit noted        Data Reviewed: I have personally reviewed following labs and imaging studies   Recent Results (from the past 240 hour(s))  SARS Coronavirus 2 (CEPHEID - Performed in [facility] hospital lab), Hosp Order     Status: None   Collection Time: 12/23/18  9:36 AM  Result Value Ref Range Status   SARS Coronavirus 2 NEGATIVE NEGATIVE Final    Comment: (NOTE) If result is NEGATIVE SARS-CoV-2 target nucleic acids are NOT DETECTED. The SARS-CoV-2 RNA is generally detectable in upper and lower  respiratory specimens during the acute phase of infection. The lowest  concentration of SARS-CoV-2 viral copies this assay can detect is 250  copies / mL. A negative result does not preclude SARS-CoV-2 infection  and should not be used as the sole basis for treatment  or other  patient management decisions.  A negative result may occur with  improper specimen collection / handling, submission of specimen other  than nasopharyngeal swab, presence of viral mutation(s) within the  areas targeted by this assay, and inadequate number of viral copies  (<250 copies / mL). A negative result must be combined with clinical  observations, patient history, and epidemiological information. If result is POSITIVE SARS-CoV-2 target nucleic acids are DETECTED. The SARS-CoV-2 RNA is generally detectable in upper and lower  respiratory specimens dur ing the acute phase of infection.  Positive  results are indicative of active infection with SARS-CoV-2.  Clinical  correlation with patient history and other diagnostic information is  necessary to determine patient infection status.  Positive results do  not rule out bacterial infection or co-infection with other viruses. If result is PRESUMPTIVE POSTIVE SARS-CoV-2 nucleic acids MAY BE PRESENT.   A presumptive positive result was obtained on the submitted specimen  and confirmed on repeat testing.  While 2019 novel coronavirus  (SARS-CoV-2) nucleic acids may be present in the submitted sample  additional confirmatory testing may be necessary for epidemiological  and / or clinical management purposes  to differentiate between  SARS-CoV-2 and other Sarbecovirus currently known to infect humans.  If clinically indicated additional testing with an alternate test  methodology [phone]) is advised. The SARS-CoV-2 RNA is generally  detectable in upper and lower respiratory sp ecimens during the acute  phase of infection. The expected result is Negative. Fact Sheet for Patients:  StrictlyIdeas.no Fact Sheet for Healthcare Providers: BankingDealers.co.za This test is not yet approved or cleared by the [place] FDA and has been authorized for detection and/or diagnosis of  SARS-CoV-2 by FDA under an Emergency Use Authorization (EUA).  This EUA will remain in effect (meaning this test can be used) for the duration of the COVID-19 declaration under Section 564(b)(1) of the Act, 21 U.S.C. section 360bbb-3(b)(1), unless the authorization is terminated or revoked sooner. Performed at [facility], [place] [address], [place], [place] [address]   Surgical PCR screen     Status: Abnormal   Collection Time: 12/23/18  7:58 PM  Result Value Ref Range Status   MRSA, PCR POSITIVE (A) NEGATIVE Final    Comment: RESULT CALLED TO, READ BACK BY AND VERIFIED WITH: M,STUTTON AT 0112 ON 12/24/18 BY A,MOHAMED    Staphylococcus aureus POSITIVE (A) NEGATIVE Final    Comment: (NOTE) The Xpert SA Assay (FDA approved for NASAL specimens in patients 9 years of age and older), is one component of a comprehensive surveillance program. It is not intended to diagnose infection nor to guide or monitor treatment. Performed at [facility], [place] [address] [place], [place] [address]      Liver Function Tests: Recent Labs  Lab 12/25/18 0804  AST 29  ALT 15  ALKPHOS 58  BILITOT 0.4  PROT 4.1*  ALBUMIN 1.9*   No results for input(s): LIPASE, AMYLASE in the last 168 hours. No results for input(s): AMMONIA in the last 168 hours.  Cardiac Enzymes: No results for input(s): CKTOTAL, CKMB, CKMBINDEX, TROPONINI in the last 168 hours. BNP (last 3 results) Recent Labs    02/18/18 2114  BNP 136.4*    ProBNP (last 3 results) Recent Labs    10/15/18 1017  PROBNP 150.0*      Studies: No results found.  Scheduled Meds: . sodium chloride   Intravenous Once  . calcium carbonate  1 tablet Oral BID WC  . docusate sodium  100 mg Oral BID  . enoxaparin (LOVENOX) injection  40 mg Subcutaneous Q24H  . folic acid  1 mg Oral Daily  . furosemide  40 mg Intravenous Q12H  . ipratropium-albuterol  3 mL Nebulization TID  . metoprolol tartrate  25  mg Oral BID  . mometasone-formoterol  2 puff Inhalation BID  . multivitamin with minerals  1 tablet Oral Daily  . potassium chloride  40 mEq Oral Once  . thiamine  100 mg Oral Daily    Admission status: Inpatient: Based on patients clinical presentation and evaluation of above clinical data, I have made determination that patient meets Inpatient criteria at this time.  Time spent: 20 min  [facility] Pager [phone]. If 7PM-7AM, please contact night-coverage at [email or web address], Office  [phone]  password [place]  12/29/2018, 3:28 PM  LOS: 6 days

## 2018-12-30 ENCOUNTER — Inpatient Hospital Stay (HOSPITAL_COMMUNITY): Payer: PPO

## 2018-12-30 LAB — BASIC METABOLIC PANEL
Anion gap: 14 (ref 5–15)
Anion gap: 10 (ref 5–15)
BUN: 25 mg/dL — ABNORMAL HIGH (ref 8–23)
BUN: 24 mg/dL — ABNORMAL HIGH (ref 8–23)
CO2: 26 mmol/L (ref 22–32)
CO2: 29 mmol/L (ref 22–32)
Calcium: 9 mg/dL (ref 8.9–10.3)
Calcium: 8.8 mg/dL — ABNORMAL LOW (ref 8.9–10.3)
Chloride: 99 mmol/L (ref 98–111)
Chloride: 100 mmol/L (ref 98–111)
Creatinine, Ser: 0.89 mg/dL (ref 0.44–1.00)
Creatinine, Ser: 0.78 mg/dL (ref 0.44–1.00)
GFR calc Af Amer: >60 mL/min (ref >60)
GFR calc Af Amer: >60 mL/min (ref >60)
GFR calc non Af Amer: >60 mL/min (ref >60)
GFR calc non Af Amer: >60 mL/min (ref >60)
Glucose, Bld: 146 mg/dL — ABNORMAL HIGH (ref 70–99)
Glucose, Bld: 115 mg/dL — ABNORMAL HIGH (ref 70–99)
Potassium: 3.6 mmol/L (ref 3.5–5.1)
Potassium: 3.8 mmol/L (ref 3.5–5.1)
Sodium: 139 mmol/L (ref 135–145)
Sodium: 139 mmol/L (ref 135–145)

## 2018-12-30 MED ORDER — ALPRAZOLAM 0.5 MG PO TABS
0.5000 mg | ORAL_TABLET | Freq: Three times a day (TID) | ORAL | Status: DC | PRN
  Administered 2018-12-30 – 2018-12-31 (×4): 0.5 mg via ORAL
  Filled 2018-12-30 (×4): qty 1

## 2018-12-30 NOTE — Progress Notes (Signed)
[facility]  PROGRESS NOTE  Jenna Marquez HEN:[id number] DOB: 1946/05/11 DOA: 12/23/2018 PCP: Darreld Mclean, MD   Brief HPI:   73 year old female with a history of anxiety, major depressive disorder, COPD, chronic respiratory failure with hypoxia, tobacco use, EtOH abuse, high-grade AV block, status post pacemaker placement, paroxysmal atrial fibrillation not on blood thinners, RBBB, CKD stage III who admitted after a fall while trying to transfer to chair and landing on right hip and arm following which she complained of leg pain.  She was unable to stand.  She was brought to ED and found to have right femur fracture.  Orthopedics was consulted.  Patient underwent IM nail on 12/24/2018.  Also found to have hemoglobin 6.5 on 12/25/2018.    Subjective   Patient seen and examined, breathing much better than yesterday.  She is currently off oxygen.  Was started on IV Lasix yesterday.  Patient is now agreeable to go to skilled facility for rehab.   Assessment/Plan:     1. Pulmonary edema-likely acute diastolic CHF, IV fluids were discontinued .Echocardiogram from 12/23/2018 showed EF 36%, diastolic function could not be assessed due to underlying A. fib.  Patient was started on Lasix 40 mg IV every 12 hours.  She has diuresed well and is currently off oxygen.  Will discontinue Lasix at this time.  Chest x-ray showsIncreasing bibasilar infiltrates, pneumonia.  Patient is afebrile.  Clinically does not appear to have pneumonia.  Would not start antibiotics at this time.  2. Close displaced comminuted intertrochanteric fracture of right femur-orthopedic surgery following.  Status post IM nailing on 12/24/2018. Plan for discharge to skilled nursing facility.  3. Bluish discoloration of left 2 toes/? cyanosis-likely from injury, tender to palpation, peripheral pulses palpable at posterior tibial on left.  Dorsalis pedis was not palpable.  ABIs obtained showed moderate peripheral vascular  disease in left lower extremity.  Vascular surgery has seen the patient and will follow-up as outpatient.  4. Acute blood loss anemia-in setting of IM nailing, also fluid overload.  Improved from yesterday, today hemoglobin is 8.7.  Follow CBC in a.m.  5. Paroxysmal atrial fibrillation-history of Mobitz type II second-degree AV block status post pacemaker-patient currently in normal sinus rhythm.  Continue metoprolol.Patient is not on anticoagulation.As per note from February 2018 from Dr. Verlon Au patient had refused anticoagulation.  6. Hypomagnesemia-Magnesium was 1.7. After replacement magnesium is 1.9.  7. Acute kidney injury-resolved, creatinine now is 0.72  Patient is agreeable to go to skilled facility for rehab.  Will consult clinical social worker for rehab placement.    CBC: Recent Labs  Lab 12/23/18 1425 12/25/18 0142 12/25/18 0804 12/25/18 1310 12/26/18 0840 12/27/18 0503  WBC 9.5 10.0  --   --  9.5 11.7*  HGB 11.0* 6.4* 6.5* 9.1* 8.1* 8.7*  HCT 33.7* 21.1* 20.3* 27.1* 25.0* 27.3*  MCV 115.8* 121.3*  --   --  107.3* 107.1*  PLT 198 149*  --   --  171 144    Basic Metabolic Panel: Recent Labs  Lab 12/24/18 0355 12/25/18 0142 12/26/18 0449 12/27/18 0503 12/28/18 0522 12/29/18 0550 12/30/18 1009  NA 139 140 136 139 139 139 139  K 4.2 3.8 4.5 3.9 3.7 3.5 3.6  CL 106 117* 104 106 104 100 99  CO2 25 18* 25 23 24 27 26   GLUCOSE 128* 110* 101* 97 98 107* 146*  BUN 18 13 23 14 15 16  25*  CREATININE 0.91 0.65 1.24* 0.74 0.72 0.87 0.89  CALCIUM 7.7* 6.0* 8.9 8.7* 8.5* 8.9 9.0  MG 1.3* 1.7  --   --  1.9  --   --      DVT prophylaxis: Lovenox  Code Status: DNR  Family Communication: Called and discussed with patient's son  Disposition Plan: Skilled nursing facility     Consultants:  Orthopedics  Procedures:  IM nail for femur fracture   Antibiotics:   Anti-infectives (From admission, onward)   Start     Dose/Rate Route Frequency Ordered Stop    12/24/18 2000  ceFAZolin (ANCEF) IVPB 2g/100 mL premix     2 g 200 mL/hr over 30 Minutes Intravenous Every 6 hours 12/24/18 1604 12/25/18 0317   12/24/18 1230  vancomycin (VANCOCIN) IVPB 1000 mg/200 mL premix     1,000 mg 200 mL/hr over 60 Minutes Intravenous  Once 12/24/18 0805 12/24/18 1337   12/24/18 0815  ceFAZolin (ANCEF) IVPB 2g/100 mL premix     2 g 200 mL/hr over 30 Minutes Intravenous On call to O.R. 12/24/18 0805 12/24/18 1343       Objective   Vitals:   12/29/18 2054 12/30/18 0509 12/30/18 0954 12/30/18 1015  BP:  (!) 105/48    Pulse:  (!) 57  91  Resp:  20    Temp:  99 F (37.2 C)    TempSrc:  Oral    SpO2: 95% 93% 94%   Weight:  67 kg    Height:        Intake/Output Summary (Last 24 hours) at 12/30/2018 1234 Last data filed at 12/30/2018 1107 Gross per 24 hour  Intake 680 ml  Output 500 ml  Net 180 ml   Filed Weights   12/27/18 0533 12/29/18 0527 12/30/18 0509  Weight: 66.4 kg 66.2 kg 67 kg     Physical Examination:  General-appears in no acute distress Heart-S1-S2, regular, no murmur auscultated Lungs-decreased breath sounds at lung bases. Abdomen-soft, nontender, no organomegaly Extremities-no edema in the lower extremities, dusky bluish discoloration in left third and fourth toes Neuro-alert, oriented x3, no focal deficit noted        Data Reviewed: I have personally reviewed following labs and imaging studies   Recent Results (from the past 240 hour(s))  SARS Coronavirus 2 (CEPHEID - Performed in [place] hospital lab), Hosp Order     Status: None   Collection Time: 12/23/18  9:36 AM  Result Value Ref Range Status   SARS Coronavirus 2 NEGATIVE NEGATIVE Final    Comment: (NOTE) If result is NEGATIVE SARS-CoV-2 target nucleic acids are NOT DETECTED. The SARS-CoV-2 RNA is generally detectable in upper and lower  respiratory specimens during the acute phase of infection. The lowest  concentration of SARS-CoV-2 viral copies this  assay can detect is 250  copies / mL. A negative result does not preclude SARS-CoV-2 infection  and should not be used as the sole basis for treatment or other  patient management decisions.  A negative result may occur with  improper specimen collection / handling, submission of specimen other  than nasopharyngeal swab, presence of viral mutation(s) within the  areas targeted by this assay, and inadequate number of viral copies  (<250 copies / mL). A negative result must be combined with clinical  observations, patient history, and epidemiological information. If result is POSITIVE SARS-CoV-2 target nucleic acids are DETECTED. The SARS-CoV-2 RNA is generally detectable in upper and lower  respiratory specimens dur ing the acute phase of infection.  Positive  results are indicative of  active infection with SARS-CoV-2.  Clinical  correlation with patient history and other diagnostic information is  necessary to determine patient infection status.  Positive results do  not rule out bacterial infection or co-infection with other viruses. If result is PRESUMPTIVE POSTIVE SARS-CoV-2 nucleic acids MAY BE PRESENT.   A presumptive positive result was obtained on the submitted specimen  and confirmed on repeat testing.  While 2019 novel coronavirus  (SARS-CoV-2) nucleic acids may be present in the submitted sample  additional confirmatory testing may be necessary for epidemiological  and / or clinical management purposes  to differentiate between  SARS-CoV-2 and other Sarbecovirus currently known to infect humans.  If clinically indicated additional testing with an alternate test  methodology [phone]) is advised. The SARS-CoV-2 RNA is generally  detectable in upper and lower respiratory sp ecimens during the acute  phase of infection. The expected result is Negative. Fact Sheet for Patients:  StrictlyIdeas.no Fact Sheet for Healthcare  Providers: BankingDealers.co.za This test is not yet approved or cleared by the [place] FDA and has been authorized for detection and/or diagnosis of SARS-CoV-2 by FDA under an Emergency Use Authorization (EUA).  This EUA will remain in effect (meaning this test can be used) for the duration of the COVID-19 declaration under Section 564(b)(1) of the Act, 21 U.S.C. section 360bbb-3(b)(1), unless the authorization is terminated or revoked sooner. Performed at [facility], [place] [address], [place], [place] [address]   Surgical PCR screen     Status: Abnormal   Collection Time: 12/23/18  7:58 PM  Result Value Ref Range Status   MRSA, PCR POSITIVE (A) NEGATIVE Final    Comment: RESULT CALLED TO, READ BACK BY AND VERIFIED WITH: M,STUTTON AT 0112 ON 12/24/18 BY A,MOHAMED    Staphylococcus aureus POSITIVE (A) NEGATIVE Final    Comment: (NOTE) The Xpert SA Assay (FDA approved for NASAL specimens in patients 30 years of age and older), is one component of a comprehensive surveillance program. It is not intended to diagnose infection nor to guide or monitor treatment. Performed at [facility], [place] [address], [place], [place] [address]      Liver Function Tests: Recent Labs  Lab 12/25/18 0804  AST 29  ALT 15  ALKPHOS 58  BILITOT 0.4  PROT 4.1*  ALBUMIN 1.9*   No results for input(s): LIPASE, AMYLASE in the last 168 hours. No results for input(s): AMMONIA in the last 168 hours.  Cardiac Enzymes: No results for input(s): CKTOTAL, CKMB, CKMBINDEX, TROPONINI in the last 168 hours. BNP (last 3 results) Recent Labs    02/18/18 2114  BNP 136.4*    ProBNP (last 3 results) Recent Labs    10/15/18 1017  PROBNP 150.0*      Studies: No results found.  Scheduled Meds: . sodium chloride   Intravenous Once  . calcium carbonate  1 tablet Oral BID WC  . docusate sodium  100 mg Oral BID  . enoxaparin (LOVENOX)  injection  40 mg Subcutaneous Q24H  . folic acid  1 mg Oral Daily  . ipratropium-albuterol  3 mL Nebulization TID  . metoprolol tartrate  25 mg Oral BID  . mometasone-formoterol  2 puff Inhalation BID  . multivitamin with minerals  1 tablet Oral Daily  . thiamine  100 mg Oral Daily    Admission status: Inpatient: Based on patients clinical presentation and evaluation of above clinical data, I have made determination that patient meets Inpatient criteria at this time.  Time spent: 20 min  [facility] Pager [phone]. If 7PM-7AM, please contact night-coverage at [email or web address], Office  [phone]  password [email or web address]  12/30/2018, 12:34 PM  LOS: 7 days

## 2018-12-30 NOTE — NC FL2 (Signed)
[place] LEVEL OF CARE SCREENING TOOL     IDENTIFICATION  Patient Name: Jenna Marquez Birthdate: 07/13/1946 Sex: female Admission Date (Current Location): 12/23/2018  [facility] and [place] Number:  Herbalist and Address:  [facility],  [place], [place]      Provider Number: [id number]  Attending Physician Name and Address:  Oswald Hillock, MD  Relative Name and Phone Number:  Gillie Manners - [phone]    Current Level of Care: Hospital Recommended Level of Care: [place] Prior Approval Number:    Date Approved/Denied:   PASRR Number: Pending (needing further information)   Discharge Plan: SNF    Current Diagnoses: Patient Active Problem List   Diagnosis Date Noted  . Closed right hip fracture ([place]) 12/23/2018  . Open right hip fracture ([place]) 12/23/2018  . Closed displaced intertrochanteric fracture of right femur ([place]) 12/23/2018  . Chronic respiratory failure with hypoxia ([place]) 04/24/2018  . MDD (major depressive disorder), single episode, severe , no psychosis ([place])   . Hemoptysis 02/19/2018  . Community acquired pneumonia 02/19/2018  . Vagina bleeding 02/19/2018  . Alcohol abuse 02/19/2018  . Left knee pain 07/11/2017  . PVD (peripheral vascular disease) ([place]) 04/04/2017  . HTN (hypertension) 11/24/2016  . Paroxysmal atrial fibrillation ([place]) 09/30/2016  . Mobitz type 2 second degree AV block 09/30/2016  . Swelling of arm 05/22/2015  . Left shoulder pain 05/22/2015  . CKD (chronic kidney disease) stage 3, GFR 30-59 ml/min (HCC) 05/11/2015  . Pacemaker 04/22/2015  . Generalized anxiety disorder 01/11/2012  . 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   . Tobacco abuse     Orientation RESPIRATION BLADDER Height & Weight     Self, Time, Situation, Place  Normal External catheter Weight: 147 lb 11.3 oz (67 kg) Height:  5' 6.5" (168.9 cm)  BEHAVIORAL SYMPTOMS/MOOD  NEUROLOGICAL BOWEL NUTRITION STATUS      Continent Diet  AMBULATORY STATUS COMMUNICATION OF NEEDS Skin   Limited Assist Verbally Other (Comment)(Incision sacrum)                       Personal Care Assistance Level of Assistance  Bathing, Feeding, Dressing Bathing Assistance: Limited assistance Feeding assistance: Independent Dressing Assistance: Limited assistance     Functional Limitations Info  Sight, Hearing, Speech Sight Info: Impaired Hearing Info: Adequate Speech Info: Adequate    SPECIAL CARE FACTORS FREQUENCY  PT (By licensed PT), OT (By licensed OT)     PT Frequency: 5x per week OT Frequency: 5x per week            Contractures Contractures Info: Not present    Additional Factors Info  Code Status, Allergies Code Status Info: DNR Allergies Info: Alelox           Current Medications (12/30/2018):  This is the current hospital active medication list Current Facility-Administered Medications  Medication Dose Route Frequency Provider Last Rate Last Dose  . 0.9 %  sodium chloride infusion (Manually program via Guardrails IV Fluids)   Intravenous Once Kirby-Graham, Karsten Fells, NP      . 0.9 %  sodium chloride infusion   Intravenous Continuous Oswald Hillock, MD   Stopped at 12/26/18 1100  . acetaminophen (TYLENOL) tablet 325-650 mg  325-650 mg Oral Q6H PRN Prudencio Burly III, PA-C   650 mg at 12/27/18 1201  . albuterol (PROVENTIL) (2.5 MG/3ML) 0.083% nebulizer solution 2.5 mg  2.5  mg Nebulization Q6H PRN Prudencio Burly III, PA-C      . ALPRAZolam Duanne Moron) tablet 0.5 mg  0.5 mg Oral TID PRN Oswald Hillock, MD   0.5 mg at 12/30/18 1017  . alum & mag hydroxide-simeth (MAALOX/MYLANTA) 200-200-20 MG/5ML suspension 15 mL  15 mL Oral Q6H PRN Oswald Hillock, MD   15 mL at 12/26/18 1845  . bisacodyl (DULCOLAX) suppository 10 mg  10 mg Rectal Daily PRN Prudencio Burly III, PA-C      . calcium carbonate (OS-CAL - dosed in mg of elemental calcium)  tablet 500 mg of elemental calcium  1 tablet Oral BID WC Kc, Ramesh, MD   500 mg of elemental calcium at 12/30/18 1015  . docusate sodium (COLACE) capsule 100 mg  100 mg Oral BID Prudencio Burly III, PA-C   100 mg at 12/30/18 1016  . enoxaparin (LOVENOX) injection 40 mg  40 mg Subcutaneous Q24H Prudencio Burly III, PA-C   40 mg at 12/30/18 1017  . folic acid (FOLVITE) tablet 1 mg  1 mg Oral Daily Polly Cobia, RPH   1 mg at 12/30/18 1016  . guaiFENesin-dextromethorphan (ROBITUSSIN DM) 100-10 MG/5ML syrup 5 mL  5 mL Oral Q4H PRN Prudencio Burly III, PA-C   5 mL at 12/23/18 1948  . HYDROcodone-acetaminophen (NORCO/VICODIN) 5-325 MG per tablet 1-2 tablet  1-2 tablet Oral Q6H PRN Prudencio Burly III, PA-C   1 tablet at 12/30/18 1017  . HYDROmorphone (DILAUDID) injection 0.25-0.5 mg  0.25-0.5 mg Intravenous Q4H PRN Prudencio Burly III, PA-C   0.5 mg at 12/29/18 2048  . ipratropium (ATROVENT) nebulizer solution 0.5 mg  0.5 mg Nebulization Q6H PRN Prudencio Burly III, PA-C   0.5 mg at 12/23/18 1421  . ipratropium-albuterol (DUONEB) 0.5-2.5 (3) MG/3ML nebulizer solution 3 mL  3 mL Nebulization TID Oswald Hillock, MD   3 mL at 12/30/18 0837  . LORazepam (ATIVAN) injection 2-3 mg  2-3 mg Intravenous Q1H PRN Prudencio Burly III, PA-C   2 mg at 12/29/18 0028  . methocarbamol (ROBAXIN) 500 mg in dextrose 5 % 50 mL IVPB  500 mg Intravenous Q8H PRN Prudencio Burly III, PA-C   Stopped at 12/24/18 0106  . metoCLOPramide (REGLAN) tablet 5-10 mg  5-10 mg Oral Q8H PRN Prudencio Burly III, PA-C       Or  . metoCLOPramide (REGLAN) injection 5-10 mg  5-10 mg Intravenous Q8H PRN Prudencio Burly III, PA-C      . metoprolol tartrate (LOPRESSOR) tablet 25 mg  25 mg Oral BID Prudencio Burly III, PA-C   25 mg at 12/30/18 1015  . mometasone-formoterol (DULERA) 200-5 MCG/ACT inhaler 2 puff  2 puff Inhalation BID Prudencio Burly III,  PA-C   2 puff at 12/30/18 [phone]  . multivitamin with minerals tablet 1 tablet  1 tablet Oral Daily Prudencio Burly III, PA-C   1 tablet at 12/30/18 1016  . ondansetron (ZOFRAN) tablet 4 mg  4 mg Oral Q6H PRN Prudencio Burly III, PA-C   4 mg at 12/27/18 1201   Or  . ondansetron (ZOFRAN) injection 4 mg  4 mg Intravenous Q6H PRN Prudencio Burly III, PA-C   4 mg at 12/25/18 1511  . polyethylene glycol (MIRALAX / GLYCOLAX) packet 17 g  17 g Oral Daily PRN Prudencio Burly III, PA-C   17 g at 12/26/18 9604  . sodium phosphate (FLEET) 7-19 GM/118ML  enema 1 enema  1 enema Rectal Once PRN Prudencio Burly III, PA-C      . thiamine (VITAMIN B-1) tablet 100 mg  100 mg Oral Daily Reuel Boom A, RPH   100 mg at 12/30/18 1016  . traZODone (DESYREL) tablet 25 mg  25 mg Oral QHS PRN Prudencio Burly III, PA-C   25 mg at 12/28/18 2100     Discharge Medications: Please see discharge summary for a list of discharge medications.  Relevant Imaging Results:  Relevant Lab Results:   Additional Information [id number]  Greg Cutter, LCSW

## 2018-12-30 NOTE — Progress Notes (Signed)
Pt had no urine OP this shift. Bladder scan at 0600 showed 110 mls. Will pass on to 1st shift.

## 2018-12-30 NOTE — Progress Notes (Signed)
Patient continues with minimal urine output, approximately 50 mls in cannister from [place].  Did have one incontinent episode as well.  MD aware, IV lasix discontinued.  Patient with adequate fluid intake.

## 2018-12-30 NOTE — Plan of Care (Signed)
Patient continues to complain of right shoulder pain, MD notified, x-ray performed, negative for fracture.  Pain meds and heating pad utilized with some improvement.  Patient emotionally up and down all shift, sometimes relaxed and conversing with staff, other times tearful and making paranoid statements like she is being held at hospital against her will.  Did get up to chair with 2 max assist and walker, patient moves right leg very little independently.  Able to remain on room air entire shift.

## 2018-12-30 NOTE — TOC Initial Note (Signed)
Transition of Care [facility]) - Initial/Assessment Note    Patient Details  Name: Jenna Marquez MRN: [id number] Date of Birth: Dec 31, 1945  Transition of [facility]) CM/SW Contact:    Greg Cutter, LCSW Phone Number: 12/30/2018, 10:56 AM  Clinical Narrative:       Patient initially declined SNF. Per MD, patient is now willing to go. Patient feels that she would be able to benefit from SNF placement. LCSW attempted to contact patient's room but was unsuccessful in reaching her. FL2 completed and faxed to SNF's in local area.           Expected Discharge Plan: [place] Barriers to Discharge: No SNF bed, Insurance Authorization, Continued Medical Work up   Patient Goals and CMS Choice Patient states their goals for this hospitalization and ongoing recovery are:: Patient prefers to go to SNF now CMS Medicare.gov Compare Post Acute Care list provided to:: Patient Choice offered to / list presented to : Patient  Expected Discharge Plan and Services Expected Discharge Plan: [place] In-house Referral: Clinical Social Work Discharge Planning Services: CM Consult Post Acute Care Choice: [place] arrangements for the past 2 months: Single Family Home Expected Discharge Date: 12/27/18               DME Arranged: [place]ford Rile rolling DME Agency: [employer]       HH Arranged: RN, PT, OT, Nurse's Aide, Refused SNF, Social Work [employer] Agency: [place]        Prior Living Arrangements/Services Living arrangements for the past 2 months: [place] Lives with:: Self(24 hours caregiver) Patient language and need for interpreter reviewed:: No Do you feel safe going back to the place where you live?: Yes      Need for Family Participation in Patient Care: Yes (Comment) Care giver support system in place?: Yes (comment) Current home services: DME Criminal Activity/Legal Involvement Pertinent to Current  Situation/Hospitalization: No - Comment as needed  Activities of Daily Living Home Assistive Devices/Equipment: Shower chair with back, Raised toilet seat with rails ADL Screening (condition at time of admission) Patient's cognitive ability adequate to safely complete daily activities?: Yes Is the patient deaf or have difficulty hearing?: No Does the patient have difficulty seeing, even when wearing glasses/contacts?: No Does the patient have difficulty concentrating, remembering, or making decisions?: No Patient able to express need for assistance with ADLs?: Yes Does the patient have difficulty dressing or bathing?: Yes Independently performs ADLs?: No Communication: Independent Dressing (OT): Independent Grooming: Needs assistance Is this a change from baseline?: Pre-admission baseline Feeding: Independent Bathing: Dependent Is this a change from baseline?: Pre-admission baseline Toileting: Independent In/Out Bed: Independent Walks in Home: Independent Does the patient have difficulty walking or climbing stairs?: Yes Weakness of Legs: Right Weakness of Arms/Hands: None  Permission Sought/Granted Permission sought to share information with : Case Manager, Family Supports Permission granted to share information with : Yes, Verbal Permission Granted     Permission granted to share info w AGENCY: SNF        Emotional Assessment Appearance:: Appears stated age   Affect (typically observed): Accepting Orientation: : Oriented to Self, Oriented to Place, Oriented to  Time, Oriented to Situation Alcohol / Substance Use: Alcohol Use Psych Involvement: No (comment)  Admission diagnosis:  Closed displaced intertrochanteric fracture of right femur, initial encounter ([place]) [S72.141A] Open right hip fracture ([place]) [S72.001B] Patient Active Problem List   Diagnosis Date Noted  .  Closed right hip fracture ([place]) 12/23/2018  . Open right hip fracture ([place]) 12/23/2018  . Closed  displaced intertrochanteric fracture of right femur ([place]) 12/23/2018  . Chronic respiratory failure with hypoxia ([place]) 04/24/2018  . MDD (major depressive disorder), single episode, severe , no psychosis ([place])   . Hemoptysis 02/19/2018  . Community acquired pneumonia 02/19/2018  . Vagina bleeding 02/19/2018  . Alcohol abuse 02/19/2018  . Left knee pain 07/11/2017  . PVD (peripheral vascular disease) ([place]) 04/04/2017  . HTN (hypertension) 11/24/2016  . Paroxysmal atrial fibrillation ([place]) 09/30/2016  . Mobitz type 2 second degree AV block 09/30/2016  . Swelling of arm 05/22/2015  . Left shoulder pain 05/22/2015  . CKD (chronic kidney disease) stage 3, GFR 30-59 ml/min (HCC) 05/11/2015  . Pacemaker 04/22/2015  . Generalized anxiety disorder 01/11/2012  . 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   . Tobacco abuse    PCP:  Copland, Gay Filler, MD Pharmacy:   [id number] [place] [address] AT [address] [place] [address] Phone: [phone] Fax: [phone]     Social Determinants of Health (SDOH) Interventions    Readmission Risk Interventions No flowsheet data found.

## 2018-12-30 NOTE — Plan of Care (Signed)
Pt C/O pain 10/10 in R shoulder. Has not C/O R leg pain this shift.

## 2018-12-31 ENCOUNTER — Telehealth: Payer: Self-pay | Admitting: Vascular Surgery

## 2018-12-31 LAB — CREATININE, SERUM
Creatinine, Ser: 0.8 mg/dL (ref 0.44–1.00)
GFR calc Af Amer: >60 mL/min (ref >60)
GFR calc non Af Amer: >60 mL/min (ref >60)

## 2018-12-31 LAB — SARS CORONAVIRUS 2 BY RT PCR (HOSPITAL ORDER, PERFORMED IN ~~LOC~~ HOSPITAL LAB): SARS Coronavirus 2: NEGATIVE

## 2018-12-31 MED ORDER — FUROSEMIDE 40 MG PO TABS
40.0000 mg | ORAL_TABLET | Freq: Every day | ORAL | Status: DC
  Administered 2018-12-31 – 2019-01-02 (×3): 40 mg via ORAL
  Filled 2018-12-31 (×3): qty 1

## 2018-12-31 NOTE — Telephone Encounter (Signed)
-----   Message from Elam Dutch, MD sent at 12/29/2018  3:04 PM EDT ----- Level 3 for ischemic left toes Dr Darrick Meigs  Pt needs follow up with me in 2 weeks with duplex left leg  Ruta Hinds

## 2018-12-31 NOTE — Progress Notes (Signed)
[facility]  PROGRESS NOTE  Jenna Marquez KPT:[id number] DOB: 31-Oct-1945 DOA: 12/23/2018 PCP: Darreld Mclean, MD   Brief HPI:   73 year old female with a history of anxiety, major depressive disorder, COPD, chronic respiratory failure with hypoxia, tobacco use, EtOH abuse, high-grade AV block, status post pacemaker placement, paroxysmal atrial fibrillation not on blood thinners, RBBB, CKD stage III who admitted after a fall while trying to transfer to chair and landing on right hip and arm following which she complained of leg pain.  She was unable to stand.  She was brought to ED and found to have right femur fracture.  Orthopedics was consulted.  Patient underwent IM nail on 12/24/2018.  Also found to have hemoglobin 6.5 on 12/25/2018.    Subjective   Patient seen and examined, denies shortness of breath.  No chest pain.   Assessment/Plan:     1. Pulmonary edema-likely acute diastolic CHF, IV fluids were discontinued .Echocardiogram from 12/23/2018 showed EF 17%, diastolic function could not be assessed due to underlying A. fib.  Patient was started on Lasix 40 mg IV every 12 hours.  She has diuresed well and is currently off oxygen.  Lasix was discontinued.  Chest x-ray showsIncreasing bibasilar infiltrates,?  Pneumonia.  Patient is afebrile.  Clinically does not appear to have pneumonia.  Would not start antibiotics at this time.  Will start Lasix 40 mg p.o. daily  2. Close displaced comminuted intertrochanteric fracture of right femur-orthopedic surgery following.  Status post IM nailing on 12/24/2018. Plan for discharge to skilled nursing facility for rehab.  3. Bluish discoloration of left 2 toes/? cyanosis-likely from injury, tender to palpation, peripheral pulses palpable at posterior tibial on left.  Dorsalis pedis was not palpable.  ABIs obtained showed moderate peripheral vascular disease in left lower extremity.  Vascular surgery has seen the patient and will follow-up as  outpatient.  4. Acute blood loss anemia-in setting of IM nailing, also fluid overload.  Improved from yesterday, today hemoglobin is 8.7.  Follow CBC in a.m.  5. Paroxysmal atrial fibrillation-history of Mobitz type II second-degree AV block status post pacemaker-patient currently in normal sinus rhythm.  Continue metoprolol.Patient is not on anticoagulation.As per note from February 2018 from Dr. Verlon Au patient had refused anticoagulation.  6. Hypomagnesemia-Magnesium was 1.7. After replacement magnesium is 1.9.  7. Acute kidney injury-resolved  Patient is agreeable to go to skilled facility for rehab.      CBC: Recent Labs  Lab 12/25/18 0142 12/25/18 0804 12/25/18 1310 12/26/18 0840 12/27/18 0503  WBC 10.0  --   --  9.5 11.7*  HGB 6.4* 6.5* 9.1* 8.1* 8.7*  HCT 21.1* 20.3* 27.1* 25.0* 27.3*  MCV 121.3*  --   --  107.3* 107.1*  PLT 149*  --   --  171 001    Basic Metabolic Panel: Recent Labs  Lab 12/25/18 0142  12/27/18 0503 12/28/18 0522 12/29/18 0550 12/30/18 1009 12/30/18 1313 12/31/18 0347  NA 140   < > 139 139 139 139 139  --   K 3.8   < > 3.9 3.7 3.5 3.6 3.8  --   CL 117*   < > 106 104 100 99 100  --   CO2 18*   < > 23 24 27 26 29   --   GLUCOSE 110*   < > 97 98 107* 146* 115*  --   BUN 13   < > 14 15 16  25* 24*  --   CREATININE 0.65   < >  0.74 0.72 0.87 0.89 0.78 0.80  CALCIUM 6.0*   < > 8.7* 8.5* 8.9 9.0 8.8*  --   MG 1.7  --   --  1.9  --   --   --   --    < > = values in this interval not displayed.     DVT prophylaxis: Lovenox  Code Status: DNR  Family Communication: Called and discussed with patient's son  Disposition Plan: Skilled nursing facility     Consultants:  Orthopedics  Procedures:  IM nail for femur fracture   Antibiotics:   Anti-infectives (From admission, onward)   Start     Dose/Rate Route Frequency Ordered Stop   12/24/18 2000  ceFAZolin (ANCEF) IVPB 2g/100 mL premix     2 g 200 mL/hr over 30 Minutes Intravenous  Every 6 hours 12/24/18 1604 12/25/18 0317   12/24/18 1230  vancomycin (VANCOCIN) IVPB 1000 mg/200 mL premix     1,000 mg 200 mL/hr over 60 Minutes Intravenous  Once 12/24/18 0805 12/24/18 1337   12/24/18 0815  ceFAZolin (ANCEF) IVPB 2g/100 mL premix     2 g 200 mL/hr over 30 Minutes Intravenous On call to O.R. 12/24/18 0805 12/24/18 1343       Objective   Vitals:   12/31/18 0500 12/31/18 0651 12/31/18 0810 12/31/18 1433  BP:  (!) 127/56  (!) 121/51  Pulse:  62  (!) 57  Resp:  16  20  Temp:  98.5 F (36.9 C)  98.7 F (37.1 C)  TempSrc:  Oral  Oral  SpO2:  93% 96%   Weight: 66.7 kg     Height:        Intake/Output Summary (Last 24 hours) at 12/31/2018 1705 Last data filed at 12/31/2018 1400 Gross per 24 hour  Intake 360 ml  Output 700 ml  Net -340 ml   Filed Weights   12/29/18 0527 12/30/18 0509 12/31/18 0500  Weight: 66.2 kg 67 kg 66.7 kg     Physical Examination:  General-appears in no acute distress Heart-S1-S2, regular, no murmur auscultated Lungs-clear to auscultation bilaterally, no wheezing or crackles auscultated Abdomen-soft, nontender, no organomegaly Extremities-no edema in the lower extremities. Neuro-alert, oriented x3, no focal deficit noted       Data Reviewed: I have personally reviewed following labs and imaging studies   Recent Results (from the past 240 hour(s))  SARS Coronavirus 2 (CEPHEID - Performed in [place] hospital lab), Hosp Order     Status: None   Collection Time: 12/23/18  9:36 AM  Result Value Ref Range Status   SARS Coronavirus 2 NEGATIVE NEGATIVE Final    Comment: (NOTE) If result is NEGATIVE SARS-CoV-2 target nucleic acids are NOT DETECTED. The SARS-CoV-2 RNA is generally detectable in upper and lower  respiratory specimens during the acute phase of infection. The lowest  concentration of SARS-CoV-2 viral copies this assay can detect is 250  copies / mL. A negative result does not preclude SARS-CoV-2 infection  and  should not be used as the sole basis for treatment or other  patient management decisions.  A negative result may occur with  improper specimen collection / handling, submission of specimen other  than nasopharyngeal swab, presence of viral mutation(s) within the  areas targeted by this assay, and inadequate number of viral copies  (<250 copies / mL). A negative result must be combined with clinical  observations, patient history, and epidemiological information. If result is POSITIVE SARS-CoV-2 target nucleic acids are DETECTED. The  SARS-CoV-2 RNA is generally detectable in upper and lower  respiratory specimens dur ing the acute phase of infection.  Positive  results are indicative of active infection with SARS-CoV-2.  Clinical  correlation with patient history and other diagnostic information is  necessary to determine patient infection status.  Positive results do  not rule out bacterial infection or co-infection with other viruses. If result is PRESUMPTIVE POSTIVE SARS-CoV-2 nucleic acids MAY BE PRESENT.   A presumptive positive result was obtained on the submitted specimen  and confirmed on repeat testing.  While 2019 novel coronavirus  (SARS-CoV-2) nucleic acids may be present in the submitted sample  additional confirmatory testing may be necessary for epidemiological  and / or clinical management purposes  to differentiate between  SARS-CoV-2 and other Sarbecovirus currently known to infect humans.  If clinically indicated additional testing with an alternate test  methodology [phone]) is advised. The SARS-CoV-2 RNA is generally  detectable in upper and lower respiratory sp ecimens during the acute  phase of infection. The expected result is Negative. Fact Sheet for Patients:  StrictlyIdeas.no Fact Sheet for Healthcare Providers: BankingDealers.co.za This test is not yet approved or cleared by the [place] FDA and has been  authorized for detection and/or diagnosis of SARS-CoV-2 by FDA under an Emergency Use Authorization (EUA).  This EUA will remain in effect (meaning this test can be used) for the duration of the COVID-19 declaration under Section 564(b)(1) of the Act, 21 U.S.C. section 360bbb-3(b)(1), unless the authorization is terminated or revoked sooner. Performed at [facility], [place] [address], [place], [place] [address]   Surgical PCR screen     Status: Abnormal   Collection Time: 12/23/18  7:58 PM  Result Value Ref Range Status   MRSA, PCR POSITIVE (A) NEGATIVE Final    Comment: RESULT CALLED TO, READ BACK BY AND VERIFIED WITH: M,STUTTON AT 0112 ON 12/24/18 BY A,MOHAMED    Staphylococcus aureus POSITIVE (A) NEGATIVE Final    Comment: (NOTE) The Xpert SA Assay (FDA approved for NASAL specimens in patients 84 years of age and older), is one component of a comprehensive surveillance program. It is not intended to diagnose infection nor to guide or monitor treatment. Performed at [facility] [place] [address], [place], [place] [address]   SARS Coronavirus 2 (CEPHEID - Performed in [place] hospital lab), Hosp Order     Status: None   Collection Time: 12/31/18 11:36 AM  Result Value Ref Range Status   SARS Coronavirus 2 NEGATIVE NEGATIVE Final    Comment: (NOTE) If result is NEGATIVE SARS-CoV-2 target nucleic acids are NOT DETECTED. The SARS-CoV-2 RNA is generally detectable in upper and lower  respiratory specimens during the acute phase of infection. The lowest  concentration of SARS-CoV-2 viral copies this assay can detect is 250  copies / mL. A negative result does not preclude SARS-CoV-2 infection  and should not be used as the sole basis for treatment or other  patient management decisions.  A negative result may occur with  improper specimen collection / handling, submission of specimen other  than nasopharyngeal swab, presence of viral  mutation(s) within the  areas targeted by this assay, and inadequate number of viral copies  (<250 copies / mL). A negative result must be combined with clinical  observations, patient history, and epidemiological information. If result is POSITIVE SARS-CoV-2 target nucleic acids are DETECTED. The SARS-CoV-2 RNA is generally detectable in upper and lower  respiratory specimens dur ing the  acute phase of infection.  Positive  results are indicative of active infection with SARS-CoV-2.  Clinical  correlation with patient history and other diagnostic information is  necessary to determine patient infection status.  Positive results do  not rule out bacterial infection or co-infection with other viruses. If result is PRESUMPTIVE POSTIVE SARS-CoV-2 nucleic acids MAY BE PRESENT.   A presumptive positive result was obtained on the submitted specimen  and confirmed on repeat testing.  While 2019 novel coronavirus  (SARS-CoV-2) nucleic acids may be present in the submitted sample  additional confirmatory testing may be necessary for epidemiological  and / or clinical management purposes  to differentiate between  SARS-CoV-2 and other Sarbecovirus currently known to infect humans.  If clinically indicated additional testing with an alternate test  methodology [phone]) is advised. The SARS-CoV-2 RNA is generally  detectable in upper and lower respiratory sp ecimens during the acute  phase of infection. The expected result is Negative. Fact Sheet for Patients:  StrictlyIdeas.no Fact Sheet for Healthcare Providers: BankingDealers.co.za This test is not yet approved or cleared by the [place] FDA and has been authorized for detection and/or diagnosis of SARS-CoV-2 by FDA under an Emergency Use Authorization (EUA).  This EUA will remain in effect (meaning this test can be used) for the duration of the COVID-19 declaration under Section 564(b)(1)  of the Act, 21 U.S.C. section 360bbb-3(b)(1), unless the authorization is terminated or revoked sooner. Performed at [facility], [place] [address], [place], [place] [address]      Liver Function Tests: Recent Labs  Lab 12/25/18 0804  AST 29  ALT 15  ALKPHOS 58  BILITOT 0.4  PROT 4.1*  ALBUMIN 1.9*   No results for input(s): LIPASE, AMYLASE in the last 168 hours. No results for input(s): AMMONIA in the last 168 hours.  Cardiac Enzymes: No results for input(s): CKTOTAL, CKMB, CKMBINDEX, TROPONINI in the last 168 hours. BNP (last 3 results) Recent Labs    02/18/18 2114  BNP 136.4*    ProBNP (last 3 results) Recent Labs    10/15/18 1017  PROBNP 150.0*      Studies: Dg Shoulder Right  Result Date: 12/30/2018 CLINICAL DATA:  Shoulder pain without known trauma. EXAM: RIGHT SHOULDER - 2+ VIEW COMPARISON:  None. FINDINGS: Mild AC joint degenerative changes. No fractures or dislocations. Limited views of the right chest are normal. Vascular calcifications noted in the axillary vessels. IMPRESSION: No fracture dislocation.  AC joint degenerative changes. Electronically Signed   By: Dorise Bullion III M.D   On: 12/30/2018 12:51    Scheduled Meds: . sodium chloride   Intravenous Once  . calcium carbonate  1 tablet Oral BID WC  . docusate sodium  100 mg Oral BID  . enoxaparin (LOVENOX) injection  40 mg Subcutaneous Q24H  . folic acid  1 mg Oral Daily  . ipratropium-albuterol  3 mL Nebulization TID  . metoprolol tartrate  25 mg Oral BID  . mometasone-formoterol  2 puff Inhalation BID  . multivitamin with minerals  1 tablet Oral Daily  . thiamine  100 mg Oral Daily    Admission status: Inpatient: Based on patients clinical presentation and evaluation of above clinical data, I have made determination that patient meets Inpatient criteria at this time.  Time spent: 20 min  [facility] Pager [phone]. If 7PM-7AM, please  contact night-coverage at [email or web address], Office  [phone]  password [email or web address]  12/31/2018, 5:05 PM  LOS: 8 days

## 2018-12-31 NOTE — Progress Notes (Signed)
Physical Therapy Treatment Patient Details Name: Jenna Marquez MRN: [id number] DOB: 09/04/1945 Today's Date: 12/31/2018    History of Present Illness 73 y.o. BF PMHx anxiety, major depressive disorder, COPD, chronic respiratory failure with hypoxia, tobacco abuse, EtOH abuse, high-grade AV block S/P pacemaker, paroxysmal atrial fibrillation not on blood thinners), RBBB, CKD stage III who was ambulatory at baseline living with her niece admitted after a fall while trying to transfer to chair. Dx of R IT fracture of femur, s/p IM nail, possible R partial quad tendon rupture    PT Comments    Pt supine in bed and willing to participate though initially lethargic with cueing required to open eyes and participate.  Max A with all mobility, +2 for safety as pt has difficulty following all verbal, tactile and demonstrate cues.  Mod-max A required for posterior lean while sitting and cueing/max A for scooting to EOB.  Upon standing pt able to make small steps towards chair, knee blocked by therapist for buckling and pt saying frequently "help I'm about to fall."  EOS pt left in chair with call bell wtihin reach, chair alarm set and NT/sitter in room.      Follow Up Recommendations  SNF;Supervision/Assistance - 24 hour     Equipment Recommendations  Rolling walker with 5" wheels;3in1 (PT)    Recommendations for Other Services       Precautions / Restrictions Precautions Precautions: Fall Required Braces or Orthoses: Knee Immobilizer - Right Knee Immobilizer - Right: On at all times Restrictions Weight Bearing Restrictions: No RLE Weight Bearing: Weight bearing as tolerated Other Position/Activity Restrictions: WBAT    Mobility  Bed Mobility Overal bed mobility: Needs Assistance Bed Mobility: Supine to Sit     Supine to sit: Max assist;+2 for physical assistance;+2 for safety/equipment;HOB elevated Sit to supine: Max assist;+2 for physical assistance;+2 for safety/equipment;HOB  elevated   General bed mobility comments: Max assist +2 for trunk elevation, LE management, and scootign to EOB with use of bed pad. Pt vocalizing in pain with mobility, improved with sitting EOB.   Transfers Overall transfer level: Needs assistance Equipment used: 2 person hand held assist Transfers: Sit to/from [facility] Sit to Stand: +2 physical assistance;Max assist Stand pivot transfers: Max assist;+2 physical assistance       General transfer comment: Max assist +2 for sit to stand to power up, steady, and transfer to recliner at bedside with small steps. Pt with forward flexed posture, and with LLE buckling. PT and PT aide had to guide pt to recliner and pt stating the whole time "I am falling"  Ambulation/Gait                 Stairs             Wheelchair Mobility    Modified Rankin (Stroke Patients Only)       Balance                                            Cognition Arousal/Alertness: Lethargic Behavior During Therapy: WFL for tasks assessed/performed Overall Cognitive Status: Impaired/Different from baseline Area of Impairment: Following commands;Safety/judgement;Problem solving;Memory                       Following Commands: Follows one step commands consistently;Follows one step commands with increased time Safety/Judgement: Decreased awareness of safety;Decreased awareness of  deficits   Problem Solving: Slow processing;Difficulty sequencing;Requires tactile cues;Requires verbal cues General Comments: Pt lethergic, able to answer questions not always to address questions.  Slightly confused though willing to participate      Exercises      General Comments        Pertinent Vitals/Pain Pain Assessment: Faces Faces Pain Scale: Hurts even more Pain Location: RLE with movement Pain Descriptors / Indicators: Guarding;Grimacing;Crying Pain Intervention(s): Monitored during session;Limited  activity within patient's tolerance;Repositioned    Home Living                      Prior Function            PT Goals (current goals can now be found in the care plan section)      Frequency    Min 3X/week      PT Plan Current plan remains appropriate    Co-evaluation              AM-PAC PT "6 Clicks" Mobility   Outcome Measure  Help needed turning from your back to your side while in a flat bed without using bedrails?: Total Help needed moving from lying on your back to sitting on the side of a flat bed without using bedrails?: Total Help needed moving to and from a bed to a chair (including a wheelchair)?: Total Help needed standing up from a chair using your arms (e.g., wheelchair or bedside chair)?: Total Help needed to walk in hospital room?: Total Help needed climbing 3-5 steps with a railing? : Total 6 Click Score: 6    End of Session Equipment Utilized During Treatment: Oxygen;Right knee immobilizer Activity Tolerance: Patient limited by pain;Patient limited by fatigue;Patient tolerated treatment well Patient left: with call bell/phone within reach;with nursing/sitter in room;in chair;with chair alarm set Nurse Communication: Mobility status PT Visit Diagnosis: History of falling (Z91.81);Difficulty in walking, not elsewhere classified (R26.2);Pain Pain - part of body: Hip     Time: 1425-1500 PT Time Calculation (min) (ACUTE ONLY): 35 min  Charges:  $Therapeutic Activity: 23-37 mins                     [address] LPTA; CBIS [phone]  Aldona Lento 12/31/2018, 5:25 PM

## 2018-12-31 NOTE — Progress Notes (Signed)
Daughter in Primary school teacher selected [facility]. Referral called. [employer] authorization [phone] was approved for only 5 days expires 5/22.  Pt will need to discharge before Friday. [facility] [phone] was called with choice.

## 2018-12-31 NOTE — Telephone Encounter (Signed)
sch appt [facility] ltr 01/17/2019 12pm LLE art 115pm f/u MD

## 2018-12-31 NOTE — Care Management Important Message (Signed)
Important Message  Patient Details IM Letter given to Cookie Imogene Case Manager to present to the Patient Name: Jenna Marquez MRN: [id number] Date of Birth: 06/06/1946   Medicare Important Message Given:  Yes    Kerin Salen 12/31/2018, 10:34 AM

## 2019-01-01 DIAGNOSIS — F4321 Adjustment disorder with depressed mood: Secondary | ICD-10-CM

## 2019-01-01 DIAGNOSIS — F32A Depression, unspecified: Secondary | ICD-10-CM

## 2019-01-01 DIAGNOSIS — F329 Major depressive disorder, single episode, unspecified: Secondary | ICD-10-CM

## 2019-01-01 LAB — CBC
HCT: 26.2 % — ABNORMAL LOW (ref 36.0–46.0)
Hemoglobin: 8.3 g/dL — ABNORMAL LOW (ref 12.0–15.0)
MCH: 33.7 pg (ref 26.0–34.0)
MCHC: 31.7 g/dL (ref 30.0–36.0)
MCV: 106.5 fL — ABNORMAL HIGH (ref 80.0–100.0)
Platelets: 628 10*3/uL — ABNORMAL HIGH (ref 150–400)
RBC: 2.46 MIL/uL — ABNORMAL LOW (ref 3.87–5.11)
RDW: 16.5 % — ABNORMAL HIGH (ref 11.5–15.5)
WBC: 13.2 10*3/uL — ABNORMAL HIGH (ref 4.0–10.5)
nRBC: 0 % (ref 0.0–0.2)

## 2019-01-01 MED ORDER — VITAMIN B-1 100 MG PO TABS
100.0000 mg | ORAL_TABLET | Freq: Every day | ORAL | Status: DC
  Administered 2019-01-01 – 2019-01-02 (×2): 100 mg via ORAL
  Filled 2019-01-01: qty 1

## 2019-01-01 NOTE — Consult Note (Addendum)
Telepsych Consultation   Reason for Consult:  SI Referring Physician:  Dr. 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   Tobacco abuse   Generalized anxiety disorder   Pacemaker   CKD (chronic kidney disease) stage 3, GFR 30-59 ml/min (HCC)   Paroxysmal atrial fibrillation (HCC)   Mobitz type 2 second degree AV block   HTN (hypertension)   Alcohol abuse   MDD (major depressive disorder), single episode, severe , no psychosis ([place])   Chronic respiratory failure with hypoxia (HCC)   Closed right hip fracture ([place])   Open right hip fracture ([place])   Total Time spent with patient: 1 hour  Subjective:   Jenna Marquez is a 73 y.o. female patient admitted with right femur fracture.  HPI:   Per chart review, patient was admitted on 5/10 with right femur fracture after landing on her right hip and arm while trying to transfer to a chair. She is recommended for SNF placement. Psychiatry was consulted for SI. Her nurse reports that she made a statement about not wanting to live earlier in the setting of pain. She was last seen by the psychiatry consult service in 02/2018 for chronic SI without a plan or intention to harm self in the setting of loss of independence. She was psychiatrically cleared and provided with outpatient mental health resources.   On interview, Jenna Marquez adamantly denies SI. She reports that she did state earlier that she wished that she had a reason not to live due to being in the hospital because it makes her feel "bad." She reports there are too many restrictions because she is unable to go places due to Covid. She reports feeling sad  because she is isolated from friends and has limited social interactions. She reports having a good relationship wit her family. She lives with her nephew. She denies HI or AVH. She reports last drinking alcohol 4 weeks ago. She endorses multiple somatic complaints including back and leg pain and indigestion. She is prescribed Xanax 0.5 mg TID PRN for anxiety in the hospital. She last had a dose yesterday around 11:45 am. She denies taking antidepressants for mood and declines medications at this time. She reports, "Leaving this hospital will make me feel better."    Past Psychiatric History: Alcohol abuse, MDD and anxiety  Risk to Self:  None. Denies SI.  Risk to Others:  None. Denies HI.  Prior Inpatient Therapy:   Denies  Prior Outpatient Therapy:   Prior medications include Xanax for anxiety/insomnia and antidepressants several years ago (she cannot recall the names).   Past Medical History:  Past Medical History:  Diagnosis Date  . Abnormal mammogram 07/20/2011  . Acute respiratory failure with hypoxia ([place]) 09/30/2016  . Alcohol abuse 02/19/2018  . ALLERGIC RHINITIS 10/13/2010   Qualifier: Diagnosis of  By: Ronnald Ramp CMA, [place]    . Anxiety    "occasionally"  . ASTHMA 10/13/2010   Qualifier: Diagnosis of  By: Ronnald Ramp CMA, Chem[place]    . Blunt trauma of nose 06/06/2012  . Chest pain 08/18/2011  . Chest pain on breathing 05/11/2015  . Chronic respiratory failure with hypoxia ([place]) 04/24/2018  . CKD (chronic kidney disease) stage 2, GFR 60-89 ml/min 09/30/2016  .  COPD exacerbation ([place]) 08/16/2013  . E. coli UTI 02/22/2018  . Elevated BP 02/23/2012  . Generalized anxiety disorder 01/11/2012  . GERD (gastroesophageal reflux disease)   . Head injury 10/23/2012  . 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 Hemoptysis 02/19/2018  . 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 HTN (hypertension) 11/24/2016  . Indigestion 04/23/2012  . Left knee pain 07/11/2017  . Left shoulder pain 05/22/2015  . Lumbar radicular pain  06/13/2012  . MDD (major depressive disorder), single episode, severe , no psychosis ([place])   . Pacemaker 04/22/2015  . Palpitations 08/16/2013  . Paroxysmal atrial fibrillation ([place]) 09/30/2016  . Pneumonia 02/19/2018  . PVD (peripheral vascular disease) ([place]) 04/04/2017  . RBBB (right bundle branch block) 07/31/2011  . Swelling of arm 05/22/2015  . Swollen uvula 11/14/2014  . Tinnitus of both ears 10/23/2012  . Tobacco abuse    QUIT 12/13   . Vagina bleeding 02/19/2018  . Vitamin D deficiency 01/30/2013    Past Surgical History:  Procedure Laterality Date  . [place]  . EP IMPLANTABLE DEVICE N/A 01/07/2015   Procedure: Pacemaker Implant;  Surgeon: Evans Lance, MD;  Location: [facility];  Service: Cardiovascular;  Laterality: N/A;  . INTRAMEDULLARY (IM) NAIL INTERTROCHANTERIC Right 12/24/2018   Procedure: RIGHT INTRAMEDULLARY (IM) NAIL INTERTROCHANTRIC;  Surgeon: Renette Butters, MD;  Location: [facility];  Service: Orthopedics;  Laterality: Right;  . PACEMAKER INSERTION    . TONSILLECTOMY AND ADENOIDECTOMY  1954   Family History:  Family History  Problem Relation Age of Onset  . Coronary artery disease Mother   . Heart disease Mother        CHF  . Congestive Heart Failure Mother   . Diabetes Neg Hx   . Cancer Neg Hx   . Stroke Neg Hx   . Colon cancer Neg Hx    Family Psychiatric  History: Denies  Social History:  Social History   Substance and Sexual Activity  Alcohol Use Yes  . Alcohol/week: 8.0 standard drinks  . Types: 8 Glasses of wine per week     Social History   Substance and Sexual Activity  Drug Use No    Social History   Socioeconomic History  . Marital status: Widowed    Spouse name: Not on file  . Number of children: 1  . Years of education: Not on file  . Highest education level: Not on file  Occupational History  . Occupation: Retired    Fish farm manager: [employer]    Comment: Production manager  Social Needs  . Financial  resource strain: Not on file  . Food insecurity:    Worry: Not on file    Inability: Not on file  . Transportation needs:    Medical: Not on file    Non-medical: Not on file  Tobacco Use  . Smoking status: Former Smoker    Packs/day: 1.00    Years: 35.00    Pack years: 35.00    Types: Cigarettes    Last attempt to quit: 08/10/2012    Years since quitting: 6.3  . Smokeless tobacco: Never Used  . Tobacco comment: Started at age 52  Substance and Sexual Activity  . Alcohol use: Yes    Alcohol/week: 8.0 standard drinks    Types: 8 Glasses of wine per week  . Drug use: No  . Sexual activity: Not Currently    Birth control/protection: Post-menopausal  Lifestyle  . Physical activity:    Days  per week: Not on file    Minutes per session: Not on file  . Stress: Not on file  Relationships  . Social connections:    Talks on phone: Not on file    Gets together: Not on file    Attends religious service: Not on file    Active member of club or organization: Not on file    Attends meetings of clubs or organizations: Not on file    Relationship status: Not on file  Other Topics Concern  . Not on file  Social History Narrative   Lost husband in 12-2011, she lost her sister in 2012, brother in July 2013. She lives by herself, God son stayed with her. She retired as Radio producer.   Additional Social History: She lives with her nephew. She reports a history of alcohol use since college. She has not had a drink for 4 weeks. She denies a history of DTs or withdrawals. She denies a history of blackouts or legal charges related to alcohol use. She denies illicit substance use.     Allergies:   Allergies  Allergen Reactions  . Avelox [Moxifloxacin Hcl In Nacl] Itching and Rash    Labs:  Results for orders placed or performed during the hospital encounter of 12/23/18 (from the past 48 hour(s))  Basic metabolic panel     Status: Abnormal   Collection Time: 12/30/18  1:13 PM  Result  Value Ref Range   Sodium 139 135 - 145 mmol/L   Potassium 3.8 3.5 - 5.1 mmol/L   Chloride 100 98 - 111 mmol/L   CO2 29 22 - 32 mmol/L   Glucose, Bld 115 (H) 70 - 99 mg/dL   BUN 24 (H) 8 - 23 mg/dL   Creatinine, Ser 0.78 0.44 - 1.00 mg/dL   Calcium 8.8 (L) 8.9 - 10.3 mg/dL   GFR calc non Af Amer >60 >60 mL/min   GFR calc Af Amer >60 >60 mL/min   Anion gap 10 5 - 15    Comment: Performed at [facility], [place] [address], [place], [place] [address]  Creatinine, serum     Status: None   Collection Time: 12/31/18  3:47 AM  Result Value Ref Range   Creatinine, Ser 0.80 0.44 - 1.00 mg/dL   GFR calc non Af Amer >60 >60 mL/min   GFR calc Af Amer >60 >60 mL/min    Comment: Performed at [facility], [place] [address], [place], [place] [address]  SARS Coronavirus 2 (CEPHEID - Performed in [place] hospital lab), Hosp Order     Status: None   Collection Time: 12/31/18 11:36 AM  Result Value Ref Range   SARS Coronavirus 2 NEGATIVE NEGATIVE    Comment: (NOTE) If result is NEGATIVE SARS-CoV-2 target nucleic acids are NOT DETECTED. The SARS-CoV-2 RNA is generally detectable in upper and lower  respiratory specimens during the acute phase of infection. The lowest  concentration of SARS-CoV-2 viral copies this assay can detect is 250  copies / mL. A negative result does not preclude SARS-CoV-2 infection  and should not be used as the sole basis for treatment or other  patient management decisions.  A negative result may occur with  improper specimen collection / handling, submission of specimen other  than nasopharyngeal swab, presence of viral mutation(s) within the  areas targeted by this assay, and inadequate number of viral copies  (<250 copies / mL). A negative result must be combined with clinical  observations,  patient history, and epidemiological information. If result is POSITIVE SARS-CoV-2 target nucleic acids are DETECTED. The SARS-CoV-2 RNA is  generally detectable in upper and lower  respiratory specimens dur ing the acute phase of infection.  Positive  results are indicative of active infection with SARS-CoV-2.  Clinical  correlation with patient history and other diagnostic information is  necessary to determine patient infection status.  Positive results do  not rule out bacterial infection or co-infection with other viruses. If result is PRESUMPTIVE POSTIVE SARS-CoV-2 nucleic acids MAY BE PRESENT.   A presumptive positive result was obtained on the submitted specimen  and confirmed on repeat testing.  While 2019 novel coronavirus  (SARS-CoV-2) nucleic acids may be present in the submitted sample  additional confirmatory testing may be necessary for epidemiological  and / or clinical management purposes  to differentiate between  SARS-CoV-2 and other Sarbecovirus currently known to infect humans.  If clinically indicated additional testing with an alternate test  methodology [id number]) is advised. The SARS-CoV-2 RNA is generally  detectable in upper and lower respiratory sp ecimens during the acute  phase of infection. The expected result is Negative. Fact Sheet for Patients:  StrictlyIdeas.no Fact Sheet for Healthcare Providers: BankingDealers.co.za This test is not yet approved or cleared by the [place] FDA and has been authorized for detection and/or diagnosis of SARS-CoV-2 by FDA under an Emergency Use Authorization (EUA).  This EUA will remain in effect (meaning this test can be used) for the duration of the COVID-19 declaration under Section 564(b)(1) of the Act, 21 U.S.C. section 360bbb-3(b)(1), unless the authorization is terminated or revoked sooner. Performed at [facility], [place] [address] [place], [place] [address]   CBC     Status: Abnormal   Collection Time: 01/01/19  5:33 AM  Result Value Ref Range   WBC 13.2 (H) 4.0 - 10.5 K/uL    RBC 2.46 (L) 3.87 - 5.11 MIL/uL   Hemoglobin 8.3 (L) 12.0 - 15.0 g/dL   HCT 26.2 (L) 36.0 - 46.0 %   MCV 106.5 (H) 80.0 - 100.0 fL   MCH 33.7 26.0 - 34.0 pg   MCHC 31.7 30.0 - 36.0 g/dL   RDW 16.5 (H) 11.5 - 15.5 %   Platelets 628 (H) 150 - 400 K/uL   nRBC 0.0 0.0 - 0.2 %    Comment: Performed at [facility], [place] [address] [place], [place] [address]    Medications:  Current Facility-Administered Medications  Medication Dose Route Frequency Provider Last Rate Last Dose  . 0.9 %  sodium chloride infusion (Manually program via Guardrails IV Fluids)   Intravenous Once Kirby-Graham, Karsten Fells, NP      . 0.9 %  sodium chloride infusion   Intravenous Continuous Oswald Hillock, MD   Stopped at 12/26/18 1100  . acetaminophen (TYLENOL) tablet 325-650 mg  325-650 mg Oral Q6H PRN Prudencio Burly III, PA-C   650 mg at 12/27/18 1201  . albuterol (PROVENTIL) (2.5 MG/3ML) 0.083% nebulizer solution 2.5 mg  2.5 mg Nebulization Q6H PRN Prudencio Burly III, PA-C      . ALPRAZolam Duanne Moron) tablet 0.5 mg  0.5 mg Oral TID PRN Oswald Hillock, MD   0.5 mg at 12/31/18 1147  . alum & mag hydroxide-simeth (MAALOX/MYLANTA) 200-200-20 MG/5ML suspension 15 mL  15 mL Oral Q6H PRN Oswald Hillock, MD   15 mL at 12/26/18 1845  . bisacodyl (DULCOLAX) suppository 10 mg  10 mg Rectal Daily  PRN Prudencio Burly III, PA-C      . calcium carbonate (OS-CAL - dosed in mg of elemental calcium) tablet 500 mg of elemental calcium  1 tablet Oral BID WC Kc, Ramesh, MD   500 mg of elemental calcium at 01/01/19 0742  . docusate sodium (COLACE) capsule 100 mg  100 mg Oral BID Prudencio Burly III, PA-C   100 mg at 01/01/19 0813  . enoxaparin (LOVENOX) injection 40 mg  40 mg Subcutaneous Q24H Prudencio Burly III, PA-C   40 mg at 01/01/19 6283  . furosemide (LASIX) tablet 40 mg  40 mg Oral Daily Oswald Hillock, MD   40 mg at 01/01/19 0813  . guaiFENesin-dextromethorphan (ROBITUSSIN DM)  100-10 MG/5ML syrup 5 mL  5 mL Oral Q4H PRN Prudencio Burly III, PA-C   5 mL at 12/23/18 1948  . HYDROcodone-acetaminophen (NORCO/VICODIN) 5-325 MG per tablet 1-2 tablet  1-2 tablet Oral Q6H PRN Prudencio Burly III, PA-C   1 tablet at 01/01/19 0743  . HYDROmorphone (DILAUDID) injection 0.25-0.5 mg  0.25-0.5 mg Intravenous Q4H PRN Prudencio Burly III, PA-C   0.5 mg at 12/29/18 2048  . ipratropium (ATROVENT) nebulizer solution 0.5 mg  0.5 mg Nebulization Q6H PRN Prudencio Burly III, PA-C   0.5 mg at 12/23/18 1421  . ipratropium-albuterol (DUONEB) 0.5-2.5 (3) MG/3ML nebulizer solution 3 mL  3 mL Nebulization TID Oswald Hillock, MD   3 mL at 01/01/19 0743  . LORazepam (ATIVAN) injection 2-3 mg  2-3 mg Intravenous Q1H PRN Prudencio Burly III, PA-C   2 mg at 12/29/18 0028  . methocarbamol (ROBAXIN) 500 mg in dextrose 5 % 50 mL IVPB  500 mg Intravenous Q8H PRN Prudencio Burly III, PA-C   Stopped at 12/24/18 0106  . metoCLOPramide (REGLAN) tablet 5-10 mg  5-10 mg Oral Q8H PRN Prudencio Burly III, PA-C       Or  . metoCLOPramide (REGLAN) injection 5-10 mg  5-10 mg Intravenous Q8H PRN Prudencio Burly III, PA-C      . metoprolol tartrate (LOPRESSOR) tablet 25 mg  25 mg Oral BID Prudencio Burly III, PA-C   25 mg at 01/01/19 0813  . mometasone-formoterol (DULERA) 200-5 MCG/ACT inhaler 2 puff  2 puff Inhalation BID Prudencio Burly III, PA-C   2 puff at 01/01/19 [phone]  . ondansetron (ZOFRAN) tablet 4 mg  4 mg Oral Q6H PRN Prudencio Burly III, PA-C   4 mg at 12/27/18 1201   Or  . ondansetron (ZOFRAN) injection 4 mg  4 mg Intravenous Q6H PRN Prudencio Burly III, PA-C   4 mg at 12/25/18 1511  . polyethylene glycol (MIRALAX / GLYCOLAX) packet 17 g  17 g Oral Daily PRN Prudencio Burly III, PA-C   17 g at 12/26/18 6160  . sodium phosphate (FLEET) 7-19 GM/118ML enema 1 enema  1 enema Rectal Once PRN Prudencio Burly III, PA-C      . thiamine (VITAMIN B-1) tablet 100 mg  100 mg Oral Daily Darrick Meigs, Marge Duncans, MD      . traZODone (DESYREL) tablet 25 mg  25 mg Oral QHS PRN Prudencio Burly III, PA-C   25 mg at 12/28/18 2100    Musculoskeletal: Strength & Muscle Tone: No atrophy noted. Gait & Station: UTA since patient is sitting in a chair. Patient leans: N/A  Psychiatric Specialty Exam: Physical Exam  Nursing note and vitals reviewed. Constitutional: She is  oriented to person, place, and time. She appears well-developed and well-nourished.  HENT:  Head: Normocephalic and atraumatic.  Neck: Normal range of motion.  Respiratory: Effort normal.  Musculoskeletal: Normal range of motion.  Neurological: She is alert and oriented to person, place, and time.  Psychiatric: Her speech is normal and behavior is normal. Judgment and thought content normal. Cognition and memory are normal. She exhibits a depressed mood.    Review of Systems  Gastrointestinal: Positive for heartburn.  Musculoskeletal: Positive for joint pain.  Psychiatric/Behavioral: Positive for depression. Negative for hallucinations and suicidal ideas.  All other systems reviewed and are negative.   Blood pressure 117/73, pulse 89, temperature 99.2 F (37.3 C), temperature source Oral, resp. rate (!) 22, height 5' 6.5" (1.689 m), weight 66.7 kg, SpO2 97 %.Body mass index is 23.38 kg/m.  General Appearance: Fairly Groomed, elderly, African American female, wearing a hospital gown with  in place who is sitting in bed. NAD.   Eye Contact:  Good  Speech:  Clear and Coherent and Normal Rate  Volume:  Normal  Mood:  Depressed  Affect:  Congruent  Thought Process:  Goal Directed, Linear and Descriptions of Associations: Intact  Orientation:  Full (Time, Place, and Person)  Thought Content:  Logical  Suicidal Thoughts:  No  Homicidal Thoughts:  No  Memory:  Immediate;   Good Recent;   Good Remote;   Good  Judgement:  Fair   Insight:  Fair  Psychomotor Activity:  Normal  Concentration:  Concentration: Good and Attention Span: Good  Recall:  Good  Fund of Knowledge:  Good  Language:  Good  Akathisia:  No  Handed:  Right  AIMS (if indicated):   N/A  Assets:  Communication Skills Desire for Improvement Resilience Social Support  ADL's:  Impaired  Cognition:  WNL  Sleep:   N/A   Assessment:  Jenna Marquez is a 73 y.o. female who was admitted on 5/10 with right femur fracture after landing on her right hip and arm while trying to transfer to a chair. Patient made a statement that she did not want to live earlier so psychiatry was consulted. She reports feeling upset because she is hospitalized but denies any thoughts to harm self or intention. She denies current SI, HI or AVH. She declines medication management for depression because she reports that her mood decline is secondary to current hospitalization. She does not warrant inpatient psychiatric hospitalization at this time.    Treatment Plan Summary: -Patient declines medication management for mood symptoms at this time.  -EKG reviewed and QTc 474 on 5/10. Please closely monitor when starting or increasing QTc prolonging agents.  -Psychiatry will sign off on patient at this time. Please consult psychiatry again as needed.   Disposition: No evidence of imminent risk to self or others at present.   Patient does not meet criteria for psychiatric inpatient admission.  This service was provided via telemedicine using a 2-way, interactive audio and video technology.  Names of all persons participating in this telemedicine service and their role in this encounter. Name: Buford Dresser, DO Role: Psychiatrist  Name: Jenna Marquez Role: Patient    Faythe Dingwall, DO 01/01/2019 12:12 PM

## 2019-01-01 NOTE — Progress Notes (Signed)
[facility]  PROGRESS NOTE  Jenna Marquez XKG:[id number] DOB: [birth year] DOA: 12/23/2018 PCP: Darreld Mclean, MD   Brief HPI:   73 year old female with a history of anxiety, major depressive disorder, COPD, chronic respiratory failure with hypoxia, tobacco use, EtOH abuse, high-grade AV block, status post pacemaker placement, paroxysmal atrial fibrillation not on blood thinners, RBBB, CKD stage III who admitted after a fall while trying to transfer to chair and landing on right hip and arm following which she complained of leg pain.  She was unable to stand.  She was brought to ED and found to have right femur fracture.  Orthopedics was consulted.  Patient underwent IM nail on 12/24/2018.  Also found to have hemoglobin 6.5 on 12/25/2018. Patient became confused in the hospital, showed suicidal intent.  One-to-one sitter was placed.  Psychiatry was consulted.  At this time psychiatry has cleared patient.  Skilled nursing facility needs patient to be without sitter for 24 hours before they can take her back to the skilled facility.  Repeat SARS-CoV-2 test is negative   Subjective   Patient seen and examined, denies shortness of breath.  Started back on oxygen last night.O2 sats 97% on 2 L of nasal cannula    Assessment/Plan:     1. Pulmonary edema-likely acute diastolic CHF, IV fluids were discontinued .Echocardiogram from 12/23/2018 showed EF 70%, diastolic function could not be assessed due to underlying A. fib.  Patient was started on Lasix 40 mg IV every 12 hours.  She has diuresed well and is currently off oxygen.  Lasix was discontinued.  Chest x-ray showsIncreasing bibasilar infiltrates,?  Pneumonia.  Patient is afebrile.  Clinically does not appear to have pneumonia.  Would not start antibiotics at this time.  Continue Lasix 40 mg p.o. daily.  2. Close displaced comminuted intertrochanteric fracture of right femur-orthopedic surgery following.  Status post IM nailing on 12/24/2018.  Plan for discharge to skilled nursing facility for rehab.  Lovenox for DVT prophylaxis as per Ortho.  3. Bluish discoloration of left 2 toes/? cyanosis-likely from injury, tender to palpation, peripheral pulses palpable at posterior tibial on left.  Dorsalis pedis was not palpable.  ABIs obtained showed moderate peripheral vascular disease in left lower extremity.  Vascular surgery has seen the patient and will follow-up as outpatient.  4. Acute blood loss anemia-in setting of IM nailing, also fluid overload.  Improved from yesterday, today hemoglobin is 8.3.  Follow CBC in a.m.  5. Paroxysmal atrial fibrillation-history of Mobitz type II second-degree AV block status post pacemaker-patient currently in normal sinus rhythm.  Continue metoprolol.Patient is not on anticoagulation.As per note from February 2018 from Dr. Verlon Au patient had refused anticoagulation.  6. Hypomagnesemia-Magnesium was 1.7. After replacement magnesium is 1.9.  7. Suicidal ideation-patient had suicidal thoughts during hospitalization and wanted to die.  One-to-one sitter was placed.  Psychiatry was consulted, Psychiatry has signed off.  Patient does not need inpatient psychiatric admission.  She is not risk for self or others as per psych note.  Will discontinue sitter.  8. Acute kidney injury-resolved  Patient is agreeable to go to skilled facility for rehab.      CBC: Recent Labs  Lab 12/26/18 0840 12/27/18 0503 01/01/19 0533  WBC 9.5 11.7* 13.2*  HGB 8.1* 8.7* 8.3*  HCT 25.0* 27.3* 26.2*  MCV 107.3* 107.1* 106.5*  PLT 171 233 628*    Basic Metabolic Panel: Recent Labs  Lab 12/27/18 0503 12/28/18 0522 12/29/18 0550 12/30/18 1009 12/30/18 1313 12/31/18 0347  NA 139 139 139 139 139  --   K 3.9 3.7 3.5 3.6 3.8  --   CL 106 104 100 99 100  --   CO2 23 24 27 26 29   --   GLUCOSE 97 98 107* 146* 115*  --   BUN 14 15 16  25* 24*  --   CREATININE 0.74 0.72 0.87 0.89 0.78 0.80  CALCIUM 8.7* 8.5* 8.9 9.0  8.8*  --   MG  --  1.9  --   --   --   --      DVT prophylaxis: Lovenox  Code Status: DNR  Family Communication: Called and discussed with patient's son  Disposition Plan: Skilled nursing facility     Consultants:  Orthopedics  Procedures:  IM nail for femur fracture   Antibiotics:   Anti-infectives (From admission, onward)   Start     Dose/Rate Route Frequency Ordered Stop   12/24/18 2000  ceFAZolin (ANCEF) IVPB 2g/100 mL premix     2 g 200 mL/hr over 30 Minutes Intravenous Every 6 hours 12/24/18 1604 12/25/18 0317   12/24/18 1230  vancomycin (VANCOCIN) IVPB 1000 mg/200 mL premix     1,000 mg 200 mL/hr over 60 Minutes Intravenous  Once 12/24/18 0805 12/24/18 1337   12/24/18 0815  ceFAZolin (ANCEF) IVPB 2g/100 mL premix     2 g 200 mL/hr over 30 Minutes Intravenous On call to O.R. 12/24/18 0805 12/24/18 1343       Objective   Vitals:   12/31/18 2105 01/01/19 0644 01/01/19 0746 01/01/19 1341  BP: (!) 105/42 117/73  (!) 118/52  Pulse: 93 89  (!) 109  Resp: 18 (!) 22  18  Temp: 98 F (36.7 C) 99.2 F (37.3 C)  98.9 F (37.2 C)  TempSrc: Oral Oral  Oral  SpO2: 99% 97% 97% 97%  Weight:      Height:        Intake/Output Summary (Last 24 hours) at 01/01/2019 1624 Last data filed at 01/01/2019 1512 Gross per 24 hour  Intake 1140 ml  Output 1150 ml  Net -10 ml   Filed Weights   12/29/18 0527 12/30/18 0509 12/31/18 0500  Weight: 66.2 kg 67 kg 66.7 kg     Physical Examination:  General-appears in no acute distress Heart-S1-S2, regular, no murmur auscultated Lungs-clear to auscultation bilaterally, no wheezing or crackles auscultated Abdomen-soft, nontender, no organomegaly Extremities-no edema in the lower extremities. Neuro-alert, oriented x3, no focal deficit noted       Data Reviewed: I have personally reviewed following labs and imaging studies   Recent Results (from the past 240 hour(s))  SARS Coronavirus 2 (CEPHEID - Performed in [place] hospital lab), Hosp Order     Status: None   Collection Time: 12/23/18  9:36 AM  Result Value Ref Range Status   SARS Coronavirus 2 NEGATIVE NEGATIVE Final    Comment: (NOTE) If result is NEGATIVE SARS-CoV-2 target nucleic acids are NOT DETECTED. The SARS-CoV-2 RNA is generally detectable in upper and lower  respiratory specimens during the acute phase of infection. The lowest  concentration of SARS-CoV-2 viral copies this assay can detect is 250  copies / mL. A negative result does not preclude SARS-CoV-2 infection  and should not be used as the sole basis for treatment or other  patient management decisions.  A negative result may occur with  improper specimen collection / handling, submission of specimen other  than nasopharyngeal swab, presence of viral mutation(s) within  the  areas targeted by this assay, and inadequate number of viral copies  (<250 copies / mL). A negative result must be combined with clinical  observations, patient history, and epidemiological information. If result is POSITIVE SARS-CoV-2 target nucleic acids are DETECTED. The SARS-CoV-2 RNA is generally detectable in upper and lower  respiratory specimens dur ing the acute phase of infection.  Positive  results are indicative of active infection with SARS-CoV-2.  Clinical  correlation with patient history and other diagnostic information is  necessary to determine patient infection status.  Positive results do  not rule out bacterial infection or co-infection with other viruses. If result is PRESUMPTIVE POSTIVE SARS-CoV-2 nucleic acids MAY BE PRESENT.   A presumptive positive result was obtained on the submitted specimen  and confirmed on repeat testing.  While 2019 novel coronavirus  (SARS-CoV-2) nucleic acids may be present in the submitted sample  additional confirmatory testing may be necessary for epidemiological  and / or clinical management purposes  to differentiate between  SARS-CoV-2 and  other Sarbecovirus currently known to infect humans.  If clinically indicated additional testing with an alternate test  methodology [phone]) is advised. The SARS-CoV-2 RNA is generally  detectable in upper and lower respiratory sp ecimens during the acute  phase of infection. The expected result is Negative. Fact Sheet for Patients:  StrictlyIdeas.no Fact Sheet for Healthcare Providers: BankingDealers.co.za This test is not yet approved or cleared by the [place] FDA and has been authorized for detection and/or diagnosis of SARS-CoV-2 by FDA under an Emergency Use Authorization (EUA).  This EUA will remain in effect (meaning this test can be used) for the duration of the COVID-19 declaration under Section 564(b)(1) of the Act, 21 U.S.C. section 360bbb-3(b)(1), unless the authorization is terminated or revoked sooner. Performed at [facility], [place] [address], [place], [place] [address]   Surgical PCR screen     Status: Abnormal   Collection Time: 12/23/18  7:58 PM  Result Value Ref Range Status   MRSA, PCR POSITIVE (A) NEGATIVE Final    Comment: RESULT CALLED TO, READ BACK BY AND VERIFIED WITH: M,STUTTON AT 0112 ON 12/24/18 BY A,MOHAMED    Staphylococcus aureus POSITIVE (A) NEGATIVE Final    Comment: (NOTE) The Xpert SA Assay (FDA approved for NASAL specimens in patients 43 years of age and older), is one component of a comprehensive surveillance program. It is not intended to diagnose infection nor to guide or monitor treatment. Performed at [facility], [place] [address], [place], [place] [address]   SARS Coronavirus 2 (CEPHEID - Performed in [place] hospital lab), Hosp Order     Status: None   Collection Time: 12/31/18 11:36 AM  Result Value Ref Range Status   SARS Coronavirus 2 NEGATIVE NEGATIVE Final    Comment: (NOTE) If result is NEGATIVE SARS-CoV-2 target nucleic acids are NOT  DETECTED. The SARS-CoV-2 RNA is generally detectable in upper and lower  respiratory specimens during the acute phase of infection. The lowest  concentration of SARS-CoV-2 viral copies this assay can detect is 250  copies / mL. A negative result does not preclude SARS-CoV-2 infection  and should not be used as the sole basis for treatment or other  patient management decisions.  A negative result may occur with  improper specimen collection / handling, submission of specimen other  than nasopharyngeal swab, presence of viral mutation(s) within the  areas targeted by this assay, and inadequate number of viral copies  (<250  copies / mL). A negative result must be combined with clinical  observations, patient history, and epidemiological information. If result is POSITIVE SARS-CoV-2 target nucleic acids are DETECTED. The SARS-CoV-2 RNA is generally detectable in upper and lower  respiratory specimens dur ing the acute phase of infection.  Positive  results are indicative of active infection with SARS-CoV-2.  Clinical  correlation with patient history and other diagnostic information is  necessary to determine patient infection status.  Positive results do  not rule out bacterial infection or co-infection with other viruses. If result is PRESUMPTIVE POSTIVE SARS-CoV-2 nucleic acids MAY BE PRESENT.   A presumptive positive result was obtained on the submitted specimen  and confirmed on repeat testing.  While 2019 novel coronavirus  (SARS-CoV-2) nucleic acids may be present in the submitted sample  additional confirmatory testing may be necessary for epidemiological  and / or clinical management purposes  to differentiate between  SARS-CoV-2 and other Sarbecovirus currently known to infect humans.  If clinically indicated additional testing with an alternate test  methodology [phone]) is advised. The SARS-CoV-2 RNA is generally  detectable in upper and lower respiratory sp ecimens during  the acute  phase of infection. The expected result is Negative. Fact Sheet for Patients:  StrictlyIdeas.no Fact Sheet for Healthcare Providers: BankingDealers.co.za This test is not yet approved or cleared by the [place] FDA and has been authorized for detection and/or diagnosis of SARS-CoV-2 by FDA under an Emergency Use Authorization (EUA).  This EUA will remain in effect (meaning this test can be used) for the duration of the COVID-19 declaration under Section 564(b)(1) of the Act, 21 U.S.C. section 360bbb-3(b)(1), unless the authorization is terminated or revoked sooner. Performed at [facility], [place] [address] [place], [place] [address]      Liver Function Tests: No results for input(s): AST, ALT, ALKPHOS, BILITOT, PROT, ALBUMIN in the last 168 hours. No results for input(s): LIPASE, AMYLASE in the last 168 hours. No results for input(s): AMMONIA in the last 168 hours.  Cardiac Enzymes: No results for input(s): CKTOTAL, CKMB, CKMBINDEX, TROPONINI in the last 168 hours. BNP (last 3 results) Recent Labs    02/18/18 2114  BNP 136.4*    ProBNP (last 3 results) Recent Labs    10/15/18 1017  PROBNP 150.0*      Studies: No results found.  Scheduled Meds: . sodium chloride   Intravenous Once  . calcium carbonate  1 tablet Oral BID WC  . docusate sodium  100 mg Oral BID  . enoxaparin (LOVENOX) injection  40 mg Subcutaneous Q24H  . furosemide  40 mg Oral Daily  . ipratropium-albuterol  3 mL Nebulization TID  . metoprolol tartrate  25 mg Oral BID  . mometasone-formoterol  2 puff Inhalation BID  . thiamine  100 mg Oral Daily    Admission status: Inpatient: Based on patients clinical presentation and evaluation of above clinical data, I have made determination that patient meets Inpatient criteria at this time.  Time spent: 20 min  [facility] Pager [phone]. If  7PM-7AM, please contact night-coverage at [email or web address], Office  [phone]  password [email or web address]  01/01/2019, 4:24 PM  LOS: 9 days

## 2019-01-02 ENCOUNTER — Inpatient Hospital Stay (HOSPITAL_COMMUNITY): Payer: PPO

## 2019-01-02 DIAGNOSIS — F411 Generalized anxiety disorder: Secondary | ICD-10-CM | POA: Diagnosis not present

## 2019-01-02 DIAGNOSIS — Z87891 Personal history of nicotine dependence: Secondary | ICD-10-CM | POA: Diagnosis not present

## 2019-01-02 DIAGNOSIS — M79672 Pain in left foot: Secondary | ICD-10-CM | POA: Diagnosis not present

## 2019-01-02 DIAGNOSIS — L97529 Non-pressure chronic ulcer of other part of left foot with unspecified severity: Secondary | ICD-10-CM | POA: Diagnosis not present

## 2019-01-02 DIAGNOSIS — I480 Paroxysmal atrial fibrillation: Secondary | ICD-10-CM | POA: Diagnosis not present

## 2019-01-02 DIAGNOSIS — M79662 Pain in left lower leg: Secondary | ICD-10-CM | POA: Diagnosis not present

## 2019-01-02 DIAGNOSIS — K219 Gastro-esophageal reflux disease without esophagitis: Secondary | ICD-10-CM | POA: Diagnosis not present

## 2019-01-02 DIAGNOSIS — I700 Atherosclerosis of aorta: Secondary | ICD-10-CM | POA: Diagnosis not present

## 2019-01-02 DIAGNOSIS — I959 Hypotension, unspecified: Secondary | ICD-10-CM | POA: Diagnosis not present

## 2019-01-02 DIAGNOSIS — N183 Chronic kidney disease, stage 3 (moderate): Secondary | ICD-10-CM | POA: Diagnosis not present

## 2019-01-02 DIAGNOSIS — R509 Fever, unspecified: Secondary | ICD-10-CM | POA: Diagnosis not present

## 2019-01-02 DIAGNOSIS — Z950 Presence of cardiac pacemaker: Secondary | ICD-10-CM | POA: Diagnosis not present

## 2019-01-02 DIAGNOSIS — W19XXXA Unspecified fall, initial encounter: Secondary | ICD-10-CM | POA: Diagnosis not present

## 2019-01-02 DIAGNOSIS — J431 Panlobular emphysema: Secondary | ICD-10-CM | POA: Diagnosis not present

## 2019-01-02 DIAGNOSIS — R230 Cyanosis: Secondary | ICD-10-CM | POA: Diagnosis not present

## 2019-01-02 DIAGNOSIS — L03116 Cellulitis of left lower limb: Secondary | ICD-10-CM | POA: Diagnosis not present

## 2019-01-02 DIAGNOSIS — Z7401 Bed confinement status: Secondary | ICD-10-CM | POA: Diagnosis not present

## 2019-01-02 DIAGNOSIS — J9601 Acute respiratory failure with hypoxia: Secondary | ICD-10-CM | POA: Diagnosis not present

## 2019-01-02 DIAGNOSIS — N182 Chronic kidney disease, stage 2 (mild): Secondary | ICD-10-CM | POA: Diagnosis not present

## 2019-01-02 DIAGNOSIS — I739 Peripheral vascular disease, unspecified: Secondary | ICD-10-CM | POA: Diagnosis not present

## 2019-01-02 DIAGNOSIS — S72141A Displaced intertrochanteric fracture of right femur, initial encounter for closed fracture: Secondary | ICD-10-CM | POA: Diagnosis not present

## 2019-01-02 DIAGNOSIS — M2550 Pain in unspecified joint: Secondary | ICD-10-CM | POA: Diagnosis not present

## 2019-01-02 DIAGNOSIS — I70245 Atherosclerosis of native arteries of left leg with ulceration of other part of foot: Secondary | ICD-10-CM | POA: Diagnosis not present

## 2019-01-02 DIAGNOSIS — F4321 Adjustment disorder with depressed mood: Secondary | ICD-10-CM

## 2019-01-02 DIAGNOSIS — I96 Gangrene, not elsewhere classified: Secondary | ICD-10-CM | POA: Diagnosis not present

## 2019-01-02 DIAGNOSIS — M87078 Idiopathic aseptic necrosis of left toe(s): Secondary | ICD-10-CM | POA: Diagnosis not present

## 2019-01-02 DIAGNOSIS — I70268 Atherosclerosis of native arteries of extremities with gangrene, other extremity: Secondary | ICD-10-CM | POA: Diagnosis not present

## 2019-01-02 DIAGNOSIS — F329 Major depressive disorder, single episode, unspecified: Secondary | ICD-10-CM

## 2019-01-02 DIAGNOSIS — Z4789 Encounter for other orthopedic aftercare: Secondary | ICD-10-CM | POA: Diagnosis not present

## 2019-01-02 DIAGNOSIS — M6281 Muscle weakness (generalized): Secondary | ICD-10-CM | POA: Diagnosis not present

## 2019-01-02 DIAGNOSIS — J449 Chronic obstructive pulmonary disease, unspecified: Secondary | ICD-10-CM | POA: Diagnosis not present

## 2019-01-02 DIAGNOSIS — E876 Hypokalemia: Secondary | ICD-10-CM | POA: Diagnosis not present

## 2019-01-02 DIAGNOSIS — Z720 Tobacco use: Secondary | ICD-10-CM | POA: Diagnosis not present

## 2019-01-02 DIAGNOSIS — I442 Atrioventricular block, complete: Secondary | ICD-10-CM | POA: Diagnosis not present

## 2019-01-02 DIAGNOSIS — F1010 Alcohol abuse, uncomplicated: Secondary | ICD-10-CM | POA: Diagnosis not present

## 2019-01-02 DIAGNOSIS — D72829 Elevated white blood cell count, unspecified: Secondary | ICD-10-CM | POA: Diagnosis not present

## 2019-01-02 DIAGNOSIS — F4322 Adjustment disorder with anxiety: Secondary | ICD-10-CM | POA: Diagnosis not present

## 2019-01-02 DIAGNOSIS — F419 Anxiety disorder, unspecified: Secondary | ICD-10-CM | POA: Diagnosis not present

## 2019-01-02 DIAGNOSIS — I70262 Atherosclerosis of native arteries of extremities with gangrene, left leg: Secondary | ICD-10-CM | POA: Diagnosis not present

## 2019-01-02 DIAGNOSIS — E559 Vitamin D deficiency, unspecified: Secondary | ICD-10-CM | POA: Diagnosis not present

## 2019-01-02 DIAGNOSIS — Z9981 Dependence on supplemental oxygen: Secondary | ICD-10-CM | POA: Diagnosis not present

## 2019-01-02 DIAGNOSIS — R45851 Suicidal ideations: Secondary | ICD-10-CM | POA: Diagnosis not present

## 2019-01-02 DIAGNOSIS — M79675 Pain in left toe(s): Secondary | ICD-10-CM | POA: Diagnosis not present

## 2019-01-02 DIAGNOSIS — J9611 Chronic respiratory failure with hypoxia: Secondary | ICD-10-CM | POA: Diagnosis not present

## 2019-01-02 DIAGNOSIS — I441 Atrioventricular block, second degree: Secondary | ICD-10-CM | POA: Diagnosis not present

## 2019-01-02 DIAGNOSIS — I10 Essential (primary) hypertension: Secondary | ICD-10-CM | POA: Diagnosis not present

## 2019-01-02 DIAGNOSIS — D500 Iron deficiency anemia secondary to blood loss (chronic): Secondary | ICD-10-CM | POA: Diagnosis not present

## 2019-01-02 DIAGNOSIS — S72141D Displaced intertrochanteric fracture of right femur, subsequent encounter for closed fracture with routine healing: Secondary | ICD-10-CM | POA: Diagnosis not present

## 2019-01-02 DIAGNOSIS — I779 Disorder of arteries and arterioles, unspecified: Secondary | ICD-10-CM | POA: Diagnosis not present

## 2019-01-02 DIAGNOSIS — Z79899 Other long term (current) drug therapy: Secondary | ICD-10-CM | POA: Diagnosis not present

## 2019-01-02 DIAGNOSIS — R410 Disorientation, unspecified: Secondary | ICD-10-CM | POA: Diagnosis not present

## 2019-01-02 DIAGNOSIS — I129 Hypertensive chronic kidney disease with stage 1 through stage 4 chronic kidney disease, or unspecified chronic kidney disease: Secondary | ICD-10-CM | POA: Diagnosis not present

## 2019-01-02 DIAGNOSIS — J810 Acute pulmonary edema: Secondary | ICD-10-CM | POA: Diagnosis not present

## 2019-01-02 DIAGNOSIS — I998 Other disorder of circulatory system: Secondary | ICD-10-CM | POA: Diagnosis not present

## 2019-01-02 DIAGNOSIS — F322 Major depressive disorder, single episode, severe without psychotic features: Secondary | ICD-10-CM | POA: Diagnosis not present

## 2019-01-02 DIAGNOSIS — L209 Atopic dermatitis, unspecified: Secondary | ICD-10-CM | POA: Diagnosis not present

## 2019-01-02 DIAGNOSIS — R41841 Cognitive communication deficit: Secondary | ICD-10-CM | POA: Diagnosis not present

## 2019-01-02 DIAGNOSIS — N178 Other acute kidney failure: Secondary | ICD-10-CM | POA: Diagnosis not present

## 2019-01-02 DIAGNOSIS — F05 Delirium due to known physiological condition: Secondary | ICD-10-CM | POA: Diagnosis not present

## 2019-01-02 DIAGNOSIS — Z1159 Encounter for screening for other viral diseases: Secondary | ICD-10-CM | POA: Diagnosis not present

## 2019-01-02 LAB — COMPREHENSIVE METABOLIC PANEL
ALT: 14 U/L (ref 0–44)
AST: 30 U/L (ref 15–41)
Albumin: 2.3 g/dL — ABNORMAL LOW (ref 3.5–5.0)
Alkaline Phosphatase: 140 U/L — ABNORMAL HIGH (ref 38–126)
Anion gap: 10 (ref 5–15)
BUN: 16 mg/dL (ref 8–23)
CO2: 29 mmol/L (ref 22–32)
Calcium: 8.5 mg/dL — ABNORMAL LOW (ref 8.9–10.3)
Chloride: 100 mmol/L (ref 98–111)
Creatinine, Ser: 0.81 mg/dL (ref 0.44–1.00)
GFR calc Af Amer: >60 mL/min (ref >60)
GFR calc non Af Amer: >60 mL/min (ref >60)
Glucose, Bld: 111 mg/dL — ABNORMAL HIGH (ref 70–99)
Potassium: 3.4 mmol/L — ABNORMAL LOW (ref 3.5–5.1)
Sodium: 139 mmol/L (ref 135–145)
Total Bilirubin: 1 mg/dL (ref 0.3–1.2)
Total Protein: 6.1 g/dL — ABNORMAL LOW (ref 6.5–8.1)

## 2019-01-02 LAB — CBC WITH DIFFERENTIAL/PLATELET
Abs Immature Granulocytes: 0.11 10*3/uL — ABNORMAL HIGH (ref 0.00–0.07)
Basophils Absolute: 0.1 10*3/uL (ref 0.0–0.1)
Basophils Relative: 1 %
Eosinophils Absolute: 0.2 10*3/uL (ref 0.0–0.5)
Eosinophils Relative: 1 %
HCT: 26.8 % — ABNORMAL LOW (ref 36.0–46.0)
Hemoglobin: 8.6 g/dL — ABNORMAL LOW (ref 12.0–15.0)
Immature Granulocytes: 1 %
Lymphocytes Relative: 8 %
Lymphs Abs: 1.1 10*3/uL (ref 0.7–4.0)
MCH: 33.6 pg (ref 26.0–34.0)
MCHC: 32.1 g/dL (ref 30.0–36.0)
MCV: 104.7 fL — ABNORMAL HIGH (ref 80.0–100.0)
Monocytes Absolute: 1.5 10*3/uL — ABNORMAL HIGH (ref 0.1–1.0)
Monocytes Relative: 10 %
Neutro Abs: 11.9 10*3/uL — ABNORMAL HIGH (ref 1.7–7.7)
Neutrophils Relative %: 79 %
Platelets: 654 10*3/uL — ABNORMAL HIGH (ref 150–400)
RBC: 2.56 MIL/uL — ABNORMAL LOW (ref 3.87–5.11)
RDW: 16.7 % — ABNORMAL HIGH (ref 11.5–15.5)
WBC: 14.9 10*3/uL — ABNORMAL HIGH (ref 4.0–10.5)
nRBC: 0 % (ref 0.0–0.2)

## 2019-01-02 LAB — MAGNESIUM: Magnesium: 1.6 mg/dL — ABNORMAL LOW (ref 1.7–2.4)

## 2019-01-02 LAB — PHOSPHORUS: Phosphorus: 3.4 mg/dL (ref 2.5–4.6)

## 2019-01-02 MED ORDER — POLYETHYLENE GLYCOL 3350 17 G PO PACK: 17.0000 g | PACK | Freq: Every day | ORAL | 0 refills | Status: AC | PRN

## 2019-01-02 MED ORDER — ONDANSETRON HCL 4 MG PO TABS: 4.0000 mg | ORAL_TABLET | Freq: Four times a day (QID) | ORAL | 0 refills | Status: AC | PRN

## 2019-01-02 MED ORDER — ACETAMINOPHEN 325 MG PO TABS: 325.0000 mg | ORAL_TABLET | Freq: Four times a day (QID) | ORAL | 0 refills | Status: AC | PRN

## 2019-01-02 MED ORDER — ALUM & MAG HYDROXIDE-SIMETH 200-200-20 MG/5ML PO SUSP: 15.0000 mL | Freq: Four times a day (QID) | ORAL | 0 refills | Status: AC | PRN

## 2019-01-02 MED ORDER — POTASSIUM CHLORIDE CRYS ER 20 MEQ PO TBCR
40.0000 meq | EXTENDED_RELEASE_TABLET | Freq: Two times a day (BID) | ORAL | Status: DC
  Administered 2019-01-02: 40 meq via ORAL
  Filled 2019-01-02: qty 2

## 2019-01-02 MED ORDER — THIAMINE HCL 100 MG PO TABS: 100.0000 mg | ORAL_TABLET | Freq: Every day | ORAL | Status: AC

## 2019-01-02 MED ORDER — ALPRAZOLAM 0.5 MG PO TABS: ORAL_TABLET | ORAL | 0 refills | Status: AC

## 2019-01-02 MED ORDER — BISACODYL 10 MG RE SUPP: 10.0000 mg | Freq: Every day | RECTAL | 0 refills | Status: AC | PRN

## 2019-01-02 MED ORDER — ATORVASTATIN CALCIUM 40 MG PO TABS: 40.0000 mg | ORAL_TABLET | Freq: Every day | ORAL | 11 refills | Status: AC

## 2019-01-02 MED ORDER — FUROSEMIDE 40 MG PO TABS: 40.0000 mg | ORAL_TABLET | Freq: Every day | ORAL | 0 refills | Status: AC

## 2019-01-02 MED ORDER — MAGNESIUM OXIDE 400 (241.3 MG) MG PO TABS: 400.0000 mg | ORAL_TABLET | Freq: Two times a day (BID) | ORAL | 0 refills | Status: AC

## 2019-01-02 MED ORDER — DOCUSATE SODIUM 100 MG PO CAPS: 100.0000 mg | ORAL_CAPSULE | Freq: Two times a day (BID) | ORAL | 0 refills | Status: AC

## 2019-01-02 MED ORDER — TRAMADOL HCL 50 MG PO TABS: ORAL_TABLET | ORAL | 0 refills | Status: DC

## 2019-01-02 MED ORDER — MAGNESIUM OXIDE 400 (241.3 MG) MG PO TABS
800.0000 mg | ORAL_TABLET | Freq: Every day | ORAL | Status: DC
  Administered 2019-01-02: 800 mg via ORAL
  Filled 2019-01-02: qty 2

## 2019-01-02 MED ORDER — GUAIFENESIN-DM 100-10 MG/5ML PO SYRP: 5.0000 mL | ORAL_SOLUTION | ORAL | 0 refills | Status: AC | PRN

## 2019-01-02 MED ORDER — MAGNESIUM SULFATE 2 GM/50ML IV SOLN
2.0000 g | Freq: Once | INTRAVENOUS | Status: DC
  Filled 2019-01-02: qty 50

## 2019-01-02 NOTE — Discharge Summary (Signed)
Physician Discharge Summary  Jenna Marquez CBU:[id number] DOB: [birth year] DOA: 12/23/2018  PCP: Darreld Mclean, MD  Admit date: 12/23/2018 Discharge date: 01/02/2019  Admitted From: Home Disposition: SNF  Recommendations for Outpatient Follow-up:  1. Follow up with PCP in 1-2 weeks 2. Follow up with Vascular Surgery in the outpatient setting and appointment scheduled for 01/17/2019 at 1:15 along with Left Leg Duplex 3. Follow-up with Orthopedic Surgery in 1 to 2 weeks 4. Follow-up with Cardiology in 1 to 2 weeks after discharge 5. Please obtain CMP/CBC, Mag, Phos in one week 6. Please follow up on the following pending results:  Home Health: No Equipment/Devices: Conservation officer, nature with 5" Wheels; 3in1    Discharge Condition: Stable CODE STATUS: FULL CODE Diet recommendation: Heart Healthy Diet   Brief/Interim Summary: 73 year old female with a history of anxiety, major depressive disorder, COPD, chronic respiratory failure with hypoxia, tobacco use, EtOH abuse, high-grade AV block, status post pacemaker placement, paroxysmal atrial fibrillation not on blood thinners, RBBB, CKD stage III who admitted after a fall while trying to transfer to chair and landing on right hip and arm following which she complained of leg pain.  She was unable to stand.  She was brought to ED and found to have right femur fracture.  Orthopedics was consulted.  Patient underwent IM nail on 12/24/2018.  Also found to have hemoglobin 6.5 on 12/25/2018. Patient became confused in the hospital, showed suicidal intent and wanted to "die.".  One-to-one sitter was placed.  Psychiatry was consulted and this time psychiatry has cleared patient.  Ligation has been complicated by mild hypokalemia and hypomagnesemia along with mildly worsening leukocytosis.  Repeat chest x-ray showed no acute processes and patient has no cough or burning or discomfort in urination.  She remains afebrile currently.  Of note patient's left toes  appeared a little cyanotic and vascular surgery is evaluated and recommending outpatient follow-up within arterial duplex and evaluation in 2 weeks.  She was started on a statin however aspirin will be held due to her recent acute blood loss anemia.  She was deemed medically stable to be discharged and will need to follow-up with PCP, orthopedic surgery, as well as vascular surgeon outpatient setting.  She will also need to follow-up with her primary cardiologist after discharge.  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   Tobacco abuse   Generalized anxiety disorder   Pacemaker   CKD (chronic kidney disease) stage 3, GFR 30-59 ml/min (HCC)   Paroxysmal atrial fibrillation (HCC)   Mobitz type 2 second degree AV block   HTN (hypertension)   Alcohol abuse   MDD (major depressive disorder), single episode, severe , no psychosis ([place])   Chronic respiratory failure with hypoxia (HCC)   Closed right hip fracture ([place])   Open right hip fracture (HCC)   Closed displaced intertrochanteric fracture of right femur (HCC)   Depression  Pulmonary Edema, improved  -Likely acute diastolic CHF, IV fluids were discontinued -Echocardiogram from 12/23/2018 showed EF 03%, diastolic function could not be assessed due to underlying A. fib.   -Patient was started on Lasix 40 mg IV every 12 hours.  She has diuresed well and was currently off oxygen but had to be placed back on and now weaning and was on 2 Liters this AM.   -IV Lasix now discontinued.   -Chest x-ray 12/26/2018 showsIncreasing bibasilar infiltrates,?  Pneumonia.  Patient  is afebrile.   -Repeat CXR today showed no acute chest process with resolved bibasilar atelectasis and thoracic aortic atherosclerosis.  -Clinically does not appear to have pneumonia.   -Would not start antibiotics at this time even with worsening Leukocytosis.    -Continue Lasix 40 mg p.o. daily at D/C -Follow up with Cardiology in outpatient setting  Close Displaced Comminuted intertrochanteric fracture of right femur -Orthopedic Surgery was following.   -Status post IM nailing on 12/24/2018.  -Plan for discharge to skilled nursing facility for rehab.   -Lovenox for DVT prophylaxis as per Ortho. -Pain Control per Orthopedic Surgery  Bluish discoloration of left 2 toes/? Cyanosis -Likely from injury, tender to palpation, peripheral pulses palpable at posterior tibial on left.   -Dorsalis pedis was not palpable.   -ABIs obtained showed moderate peripheral vascular disease in left lower extremity.   -Vascular Surgery has seen the patient on 12/29/2018 and will follow-up as outpatient on 01/17/2019 and will obtain Arterial Duplex at that visit  -Will hold off on ASA given recent Acute Blood Loss Anemia and defer to PCP to initiate but will start Statin at Atorvastatin 40 mg qHS  Acute Blood Loss Anemia/Chronic Macrocytic Anemia -In setting of IM nailing, also fluid overload.  Improved from yesterday -Patient's hemoglobin/hematocrit is now stable at 8.6/26.8 -Continue monitor for signs and symptoms of bleeding -Repeat CBC within 1 week.  Paroxysmal Atrial fibrillation-history of Mobitz type II second-degree AV block status post pacemaker -Patient currently in normal sinus rhythm.   -Continue Metoprolol.  -Patient is not on anticoagulation as she has refused it in the past. -Will need to Discuss about Anticoagulation in the outpatient setting and will not start ASA currently due to Acute Blood Loss Anemia  ? Suicidal Ideation -Patient had suicidal thoughts during hospitalization and wanted to die.   -One-to-one sitter was placed.  Psychiatry was consulted, Psychiatry has signed off.  Patient does not need inpatient psychiatric admission.  She is not risk for self or others as per psych note. -1:1 Sitter Discontinued and stable to D/C  Acute  Kidney Injury -Resolved -BUN/Cr is now 16/0.81 -Continue to Monitor and Repeat CMP as an outpatient   Thrombocytosis -Likely reactive -Patient's platelet count went from 233 and is now trended up to 654 -Continue monitor and repeat CBC within 1 week  Hypokalemia -Patient's potassium this morning was 3.4 -Replete with p.o. potassium chloride 40 mEq twice daily x2 doses -Continue monitor replete as necessary -Magnesium level was 1.6 and is currently being. -Repeat CMP within 1 week  Hypomagnesemia -Patient's magnesium level this morning is 1.6 -Replete with IV mag sulfate 2 g however lost IV Access so replete with po Mag Oxide 800 mg x1 and then 400 mg po BID x 2 days  -We will monitor replete as necessary -Repeat magnesium level within 1 week  Leukocytosis, mildly worsened  -Slightly worsening and trending upward went from 9.5 is now 14.9 -Likely reactive in the setting of her hip replacement along furosemide usage; likely a component from pain -Currently remains afebrile -SARS-CoV-2 was negative -Continue monitor for signs of symptoms of infection; patient states that her hip is sore.  No shortness breath, cough, burning or discomfort on urination. -Repeat CXR Negative  -Currently not on any steroids -Repeat CBC within 1 week and recommend further workup if remains elevated.  Discharge Instructions  Discharge Instructions    Call MD for:  difficulty breathing, headache or visual disturbances   Complete by:  As directed  Call MD for:  extreme fatigue   Complete by:  As directed    Call MD for:  hives   Complete by:  As directed    Call MD for:  persistant dizziness or light-headedness   Complete by:  As directed    Call MD for:  persistant nausea and vomiting   Complete by:  As directed    Call MD for:  redness, tenderness, or signs of infection (pain, swelling, redness, odor or green/yellow discharge around incision site)   Complete by:  As directed    Call MD for:   severe uncontrolled pain   Complete by:  As directed    Call MD for:  temperature >100.4   Complete by:  As directed    Diet - low sodium heart healthy   Complete by:  As directed    Discharge instructions   Complete by:  As directed    You were cared for by a hospitalist during your hospital stay. If you have any questions about your discharge medications or the care you received while you were in the hospital after you are discharged, you can call the unit and ask to speak with the hospitalist on call if the hospitalist that took care of you is not available. Once you are discharged, your primary care physician will handle any further medical issues. Please note that NO REFILLS for any discharge medications will be authorized once you are discharged, as it is imperative that you return to your primary care physician (or establish a relationship with a primary care physician if you do not have one) for your aftercare needs so that they can reassess your need for medications and monitor your lab values.  Follow up with PCP, Orthopedic Surgery, and Vascular Surgery. Take all medications as prescribed. If symptoms change or worsen please return to the ED for evaluation   Increase activity slowly   Complete by:  As directed      Allergies as of 01/02/2019      Reactions   Avelox [moxifloxacin Hcl In Nacl] Itching, Rash      Medication List    STOP taking these medications   dicyclomine 20 MG tablet Commonly known as:  BENTYL   gabapentin 100 MG capsule Commonly known as:  NEURONTIN   ibuprofen 800 MG tablet Commonly known as:  ADVIL     TAKE these medications   acetaminophen 325 MG tablet Commonly known as:  TYLENOL Take 1-2 tablets (325-650 mg total) by mouth every 6 (six) hours as needed for mild pain (pain score 1-3 or temp > 100.5).   acyclovir ointment 5 % Commonly known as:  ZOVIRAX Apply 1 application topically 3 (three) times daily. What changed:    when to take  this  reasons to take this   albuterol 108 (90 Base) MCG/ACT inhaler Commonly known as:  ProAir HFA Inhale 1-2 puffs into the lungs every 6 (six) hours as needed for wheezing or shortness of breath.   albuterol (2.5 MG/3ML) 0.083% nebulizer solution Commonly known as:  PROVENTIL Take 3 mLs (2.5 mg total) by nebulization every 6 (six) hours as needed for wheezing or shortness of breath.   ALPRAZolam 0.5 MG tablet Commonly known as:  XANAX TAKE 1 TAB EVERY 8 HRS AS NEEDED FOR ANXIETY, DO NOT TAKE AT NIGHT WITH HYDROXYZINE OR WITH ALCOHOL What changed:  See the new instructions.   alum & mag hydroxide-simeth 200-200-20 MG/5ML suspension Commonly known as:  MAALOX/MYLANTA Take 15 mLs  by mouth every 6 (six) hours as needed for indigestion or heartburn.   atorvastatin 40 MG tablet Commonly known as:  Lipitor Take 1 tablet (40 mg total) by mouth daily.   bisacodyl 10 MG suppository Commonly known as:  DULCOLAX Place 1 suppository (10 mg total) rectally daily as needed for moderate constipation.   budesonide-formoterol 160-4.5 MCG/ACT inhaler Commonly known as:  Symbicort Inhale 2 puffs into the lungs 2 (two) times daily.   calcium-vitamin D 250-125 MG-UNIT tablet Commonly known as:  OSCAL WITH D Take 1 tablet by mouth daily.   docusate sodium 100 MG capsule Commonly known as:  COLACE Take 1 capsule (100 mg total) by mouth 2 (two) times daily.   enoxaparin 40 MG/0.4ML injection Commonly known as:  LOVENOX Inject 0.4 mLs (40 mg total) into the skin daily for 30 doses. For 30 days post op for DVT prophylaxis   fluocinonide cream 0.05 % Commonly known as:  LIDEX Apply 1 application topically 2 (two) times daily.   furosemide 40 MG tablet Commonly known as:  LASIX Take 1 tablet (40 mg total) by mouth daily.   guaiFENesin-dextromethorphan 100-10 MG/5ML syrup Commonly known as:  ROBITUSSIN DM Take 5 mLs by mouth every 4 (four) hours as needed for cough (chest congestion).    ipratropium 0.02 % nebulizer solution Commonly known as:  ATROVENT INHALE 2.5ML BY NEBULIZATION EVERY 6 HOURS AS NEEDED FOR WHEEZING OR SHORTNESS OF BREATH. What changed:    how much to take  how to take this  when to take this  reasons to take this  additional instructions   magnesium oxide 400 (241.3 Mg) MG tablet Commonly known as:  MAG-OX Take 1 tablet (400 mg total) by mouth 2 (two) times daily for 2 days.   methotrexate 2.5 MG tablet Commonly known as:  RHEUMATREX Take 12.5 mg by mouth every Monday.   metoprolol tartrate 25 MG tablet Commonly known as:  LOPRESSOR Take 1 tablet (25 mg total) by mouth 2 (two) times daily.   ondansetron 4 MG tablet Commonly known as:  ZOFRAN Take 1 tablet (4 mg total) by mouth every 6 (six) hours as needed for nausea.   polyethylene glycol 17 g packet Commonly known as:  MIRALAX / GLYCOLAX Take 17 g by mouth daily as needed for mild constipation.   predniSONE 10 MG tablet Commonly known as:  DELTASONE 4 tabs by mouth once daily for 2 days, then 3 tabs daily x 2 days, then 2 tabs daily x 2 days, then 1 tab daily x 2 days   thiamine 100 MG tablet Take 1 tablet (100 mg total) by mouth daily.   traMADol 50 MG tablet Commonly known as:  ULTRAM Do not take with xanax or alcohol. RESUME AFTER HYDROCODONE-ACETAMINOPHEN is finished What changed:  additional instructions   Vitamin D 50 MCG (2000 UT) Caps Take 1 capsule by mouth daily.     ASK your doctor about these medications   HYDROcodone-acetaminophen 5-325 MG tablet Commonly known as:  Norco Take 1 tablet by mouth every 6 (six) hours as needed for up to 7 days for severe pain. Ask about: Should I take this medication?      Follow-up Information    Renette Butters, MD In 2 weeks.   Specialty:  Orthopedic Surgery Contact information: [address] [place] [address] [phone]        Elam Dutch, MD Follow up in 2 week(s).    Specialties:  Vascular  Surgery, Cardiology Contact information: [address] [place] [address] [phone]        Darreld Mclean, MD. Call.   Specialty:  Family Medicine Why:  Follow up within 1 week Contact information: [address] [place] [address] [phone]        Constance Haw, MD .   Specialty:  Cardiology Contact information: [address] [place] [address] [phone]          Allergies  Allergen Reactions  . Avelox [Moxifloxacin Hcl In Nacl] Itching and Rash   Consultations:  Orthopedic Surgery  Vascular Surgery  Procedures/Studies: Dg Chest 2 View  Result Date: 12/26/2018 CLINICAL DATA:  Shortness of breath EXAM: CHEST - 2 VIEW COMPARISON:  12/23/2018 FINDINGS: Left pacer remains in place, unchanged. Heart is normal size. Increasing bibasilar opacities, right greater than left could reflect atelectasis or infiltrates. No visible significant effusions or acute bony abnormality. IMPRESSION: Increasing bibasilar atelectasis or infiltrates, right greater than left. Pneumonia cannot be excluded in the right lung base. Electronically Signed   By: Rolm Baptise M.D.   On: 12/26/2018 13:20   Dg Shoulder Right  Result Date: 12/30/2018 CLINICAL DATA:  Shoulder pain without known trauma. EXAM: RIGHT SHOULDER - 2+ VIEW COMPARISON:  None. FINDINGS: Mild AC joint degenerative changes. No fractures or dislocations. Limited views of the right chest are normal. Vascular calcifications noted in the axillary vessels. IMPRESSION: No fracture dislocation.  AC joint degenerative changes. Electronically Signed   By: Dorise Bullion III M.D   On: 12/30/2018 12:51   Dg Elbow Complete Right  Result Date: 12/23/2018 CLINICAL DATA:  Right elbow pain after fall. EXAM: RIGHT ELBOW - COMPLETE 3+ VIEW COMPARISON:  None. FINDINGS: There is no evidence of fracture, dislocation, or joint effusion. There is no evidence of arthropathy  or other focal bone abnormality. Soft tissues are unremarkable. IMPRESSION: Negative. Electronically Signed   By: Marijo Conception M.D.   On: 12/23/2018 09:26   Dg Chest Port 1 View  Result Date: 01/02/2019 CLINICAL DATA:  Increasing shortness of breath, COPD EXAM: PORTABLE CHEST 1 VIEW COMPARISON:  12/26/2018 FINDINGS: Left subclavian 2 lead pacer noted. Normal heart size and vascularity. Resolved bibasilar atelectasis with improved aeration. No significant effusion or pneumothorax. No focal pneumonia, collapse or consolidation. Negative for edema. Trachea midline. Aorta atherosclerotic. IMPRESSION: No acute chest process. Resolved bibasilar atelectasis Thoracic aortic atherosclerosis Electronically Signed   By: Jerilynn Mages.  Shick M.D.   On: 01/02/2019 14:19   Dg Chest Port 1 View  Result Date: 12/23/2018 CLINICAL DATA:  Fall, right hip fracture EXAM: PORTABLE CHEST 1 VIEW COMPARISON:  1919 FINDINGS: Left subclavian 2 lead pacer noted. Normal heart size and vascularity. Minor basilar atelectasis and slightly decreased lung volumes. No focal pneumonia, collapse or consolidation. Negative for edema, effusion or pneumothorax. Atherosclerosis noted. Trachea is midline. Bones are osteopenic. IMPRESSION: Basilar atelectasis. Atherosclerosis No acute chest process. Electronically Signed   By: Jerilynn Mages.  Shick M.D.   On: 12/23/2018 09:34   Dg Knee Right Port  Result Date: 12/24/2018 CLINICAL DATA:  Right knee pain secondary to a fall yesterday. EXAM: PORTABLE RIGHT KNEE - 1-2 VIEW COMPARISON:  None. FINDINGS: There is no fracture or dislocation. There is chondrocalcinosis visible in the medial and lateral compartments. Intramedullary nail in the distal femoral shaft with fixation screw. There is marked soft tissue swelling just above the patella. Finding is consistent with at least a partial rupture of the  distal quadriceps tendon. There is a small amount of fluid in the suprapatellar bursa. IMPRESSION: Findings are consistent  at least partial rupture of the distal quadriceps tendon. No acute bone abnormality. Electronically Signed   By: Lorriane Shire M.D.   On: 12/24/2018 15:36   Dg Foot Complete Left  Result Date: 12/18/2018 CLINICAL DATA:  Left foot pain after injury last week. EXAM: LEFT FOOT - COMPLETE 3+ VIEW COMPARISON:  None. FINDINGS: There is no evidence of fracture or dislocation. There is no evidence of arthropathy or other focal bone abnormality. Vascular calcifications are noted. IMPRESSION: No acute abnormality is noted. Electronically Signed   By: Marijo Conception M.D.   On: 12/18/2018 14:29   Dg C-arm 1-60 Min-no Report  Result Date: 12/24/2018 Fluoroscopy was utilized by the requesting physician.  No radiographic interpretation.   Vas [place] Burnard Bunting With/wo Tbi  Result Date: 12/27/2018 LOWER EXTREMITY DOPPLER STUDY Indications: Left toes discoloration.  Performing Technologist: June Leap RDMS, RVT  Examination Guidelines: A complete evaluation includes at minimum, Doppler waveform signals and systolic blood pressure reading at the level of bilateral brachial, anterior tibial, and posterior tibial arteries, when vessel segments are accessible. Bilateral testing is considered an integral part of a complete examination. Photoelectric Plethysmograph (PPG) waveforms and toe systolic pressure readings are included as required and additional duplex testing as needed. Limited examinations for reoccurring indications may be performed as noted.  ABI Findings: +--------+------------------+-----+---------+----------------+ Right   Rt Pressure (mmHg)IndexWaveform Comment          +--------+------------------+-----+---------+----------------+ [id number]                    triphasic                 +--------+------------------+-----+---------+----------------+ ATA     131               0.98 biphasic                  +--------+------------------+-----+---------+----------------+ PTA     255               1.92  biphasic non compressible +--------+------------------+-----+---------+----------------+ +--------+------------------+-----+----------+---------------------------------+ Left    Lt Pressure (mmHg)IndexWaveform  Comment                           +--------+------------------+-----+----------+---------------------------------+ Brachial                                 unable to obtain BP due to                                                 patient position                  +--------+------------------+-----+----------+---------------------------------+ ATA     71                0.53 monophasic                                  +--------+------------------+-----+----------+---------------------------------+ PTA     82                0.62 monophasic                                  +--------+------------------+-----+----------+---------------------------------+  Unable to obtain toe waveforms/ pressures due to patient in pain when touched.  Summary: Right: Resting right ankle-brachial index indicates noncompressible right lower extremity arteries. Left: Resting left ankle-brachial index indicates moderate left lower extremity arterial disease.  *See table(s) above for measurements and observations.  Electronically signed by Monica Martinez MD on 12/27/2018 at 4:26:42 PM.   Final    Dg Hip Operative Unilat With Pelvis Right  Result Date: 12/24/2018 CLINICAL DATA:  Internal fixation of right proximal femur fracture. EXAM: OPERATIVE right HIP (WITH PELVIS IF PERFORMED) 4 VIEWS TECHNIQUE: Fluoroscopic spot image(s) were submitted for interpretation post-operatively. Radiation exposure index: 6.07 mGy. COMPARISON:  Radiographs of Dec 23, 2018. FINDINGS: Four fluoroscopic images demonstrate intramedullary rod fixation of the right femur with internal fixation of intertrochanteric fracture of proximal right femur. Good alignment of fracture components is noted. IMPRESSION: Fluoroscopic  guidance provided during surgical internal fixation intertrochanteric fracture of proximal right femur. Electronically Signed   By: Marijo Conception M.D.   On: 12/24/2018 14:47   Dg Hip Unilat With Pelvis 2-3 Views Right  Result Date: 12/23/2018 CLINICAL DATA:  Right hip pain after fall today. EXAM: DG HIP (WITH OR WITHOUT PELVIS) 2-3V RIGHT COMPARISON:  None. FINDINGS: Severely comminuted and angulated fracture is seen involving the intertrochanteric region of the proximal right femur. Left hip appears normal. Vascular calcifications are noted. IMPRESSION: Severely comminuted and angulated intertrochanteric fracture of proximal right femur. Electronically Signed   By: Marijo Conception M.D.   On: 12/23/2018 09:25    Subjective: Seen and examined at bedside and was very agitated as her food was cold.  Denies any chest pain, lightheadedness or dizziness.  No shortness of breath, nausea or vomiting.  No burning or discomfort in the urine.  States that her hip was sore.  States she had no other concerns or complaints at this time.  Discharge Exam: Vitals:   01/02/19 1212 01/02/19 1355  BP:  (!) 143/76  Pulse:  97  Resp:  20  Temp:  98 F (36.7 C)  SpO2: 91% 94%   Vitals:   01/02/19 0804 01/02/19 0910 01/02/19 1212 01/02/19 1355  BP:    (!) 143/76  Pulse:    97  Resp:    20  Temp:  97.6 F (36.4 C)  98 F (36.7 C)  TempSrc:    Oral  SpO2: 94% 98% 91% 94%  Weight:      Height:       General: Pt is alert, awake, not in acute distress but is extremely agitated  Cardiovascular: RRR, S1/S2 +, no rubs, no gallops Respiratory: Diminished bilaterally, no wheezing, no rhonchi Abdominal: Soft, NT, ND, bowel sounds + Extremities: no appreciable edema; Left toes with bluish hue and discoloration; Left Leg in immobilizer   The results of significant diagnostics from this hospitalization (including imaging, microbiology, ancillary and laboratory) are listed below for reference.     Microbiology: Recent Results (from the past 240 hour(s))  Surgical PCR screen     Status: Abnormal   Collection Time: 12/23/18  7:58 PM  Result Value Ref Range Status   MRSA, PCR POSITIVE (A) NEGATIVE Final    Comment: RESULT CALLED TO, READ BACK BY AND VERIFIED WITH: M,STUTTON AT 0112 ON 12/24/18 BY A,MOHAMED    Staphylococcus aureus POSITIVE (A) NEGATIVE Final    Comment: (NOTE) The Xpert SA Assay (FDA approved for NASAL specimens in patients 5 years of age and older), is one component of a comprehensive surveillance  program. It is not intended to diagnose infection nor to guide or monitor treatment. Performed at [facility], [place] [address] [place], [place] [address]   SARS Coronavirus 2 (CEPHEID - Performed in [facility] hospital lab), Hosp Order     Status: None   Collection Time: 12/31/18 11:36 AM  Result Value Ref Range Status   SARS Coronavirus 2 NEGATIVE NEGATIVE Final    Comment: (NOTE) If result is NEGATIVE SARS-CoV-2 target nucleic acids are NOT DETECTED. The SARS-CoV-2 RNA is generally detectable in upper and lower  respiratory specimens during the acute phase of infection. The lowest  concentration of SARS-CoV-2 viral copies this assay can detect is 250  copies / mL. A negative result does not preclude SARS-CoV-2 infection  and should not be used as the sole basis for treatment or other  patient management decisions.  A negative result may occur with  improper specimen collection / handling, submission of specimen other  than nasopharyngeal swab, presence of viral mutation(s) within the  areas targeted by this assay, and inadequate number of viral copies  (<250 copies / mL). A negative result must be combined with clinical  observations, patient history, and epidemiological information. If result is POSITIVE SARS-CoV-2 target nucleic acids are DETECTED. The SARS-CoV-2 RNA is generally detectable in upper and lower  respiratory specimens  dur ing the acute phase of infection.  Positive  results are indicative of active infection with SARS-CoV-2.  Clinical  correlation with patient history and other diagnostic information is  necessary to determine patient infection status.  Positive results do  not rule out bacterial infection or co-infection with other viruses. If result is PRESUMPTIVE POSTIVE SARS-CoV-2 nucleic acids MAY BE PRESENT.   A presumptive positive result was obtained on the submitted specimen  and confirmed on repeat testing.  While 2019 novel coronavirus  (SARS-CoV-2) nucleic acids may be present in the submitted sample  additional confirmatory testing may be necessary for epidemiological  and / or clinical management purposes  to differentiate between  SARS-CoV-2 and other Sarbecovirus currently known to infect humans.  If clinically indicated additional testing with an alternate test  methodology [phone]) is advised. The SARS-CoV-2 RNA is generally  detectable in upper and lower respiratory sp ecimens during the acute  phase of infection. The expected result is Negative. Fact Sheet for Patients:  StrictlyIdeas.no Fact Sheet for Healthcare Providers: BankingDealers.co.za This test is not yet approved or cleared by the [place] FDA and has been authorized for detection and/or diagnosis of SARS-CoV-2 by FDA under an Emergency Use Authorization (EUA).  This EUA will remain in effect (meaning this test can be used) for the duration of the COVID-19 declaration under Section 564(b)(1) of the Act, 21 U.S.C. section 360bbb-3(b)(1), unless the authorization is terminated or revoked sooner. Performed at [facility], [place] [address], [place], [place] [address]     Labs: BNP (last 3 results) Recent Labs    02/18/18 2114  BNP 169.6*   Basic Metabolic Panel: Recent Labs  Lab 12/28/18 0522 12/29/18 0550 12/30/18 1009 12/30/18 1313  12/31/18 0347 01/02/19 1116  NA 139 139 139 139  --  139  K 3.7 3.5 3.6 3.8  --  3.4*  CL 104 100 99 100  --  100  CO2 24 27 26 29   --  29  GLUCOSE 98 107* 146* 115*  --  111*  BUN 15 16 25* 24*  --  16  CREATININE 0.72 0.87 0.89 0.78  0.80 0.81  CALCIUM 8.5* 8.9 9.0 8.8*  --  8.5*  MG 1.9  --   --   --   --  1.6*  PHOS  --   --   --   --   --  3.4   Liver Function Tests: Recent Labs  Lab 01/02/19 1116  AST 30  ALT 14  ALKPHOS 140*  BILITOT 1.0  PROT 6.1*  ALBUMIN 2.3*   No results for input(s): LIPASE, AMYLASE in the last 168 hours. No results for input(s): AMMONIA in the last 168 hours. CBC: Recent Labs  Lab 12/27/18 0503 01/01/19 0533 01/02/19 1116  WBC 11.7* 13.2* 14.9*  NEUTROABS  --   --  11.9*  HGB 8.7* 8.3* 8.6*  HCT 27.3* 26.2* 26.8*  MCV 107.1* 106.5* 104.7*  PLT 233 628* 654*   Cardiac Enzymes: No results for input(s): CKTOTAL, CKMB, CKMBINDEX, TROPONINI in the last 168 hours. BNP: Invalid input(s): POCBNP CBG: No results for input(s): GLUCAP in the last 168 hours. D-Dimer No results for input(s): DDIMER in the last 72 hours. Hgb A1c No results for input(s): HGBA1C in the last 72 hours. Lipid Profile No results for input(s): CHOL, HDL, LDLCALC, TRIG, CHOLHDL, LDLDIRECT in the last 72 hours. Thyroid function studies No results for input(s): TSH, T4TOTAL, T3FREE, THYROIDAB in the last 72 hours.  Invalid input(s): FREET3 Anemia work up No results for input(s): VITAMINB12, FOLATE, FERRITIN, TIBC, IRON, RETICCTPCT in the last 72 hours. Urinalysis    Component Value Date/Time   COLORURINE AMBER (A) 08/16/2018 0524   APPEARANCEUR HAZY (A) 08/16/2018 0524   LABSPEC 1.015 08/16/2018 0524   PHURINE 5.0 08/16/2018 0524   GLUCOSEU NEGATIVE 08/16/2018 0524   HGBUR SMALL (A) 08/16/2018 0524   BILIRUBINUR NEGATIVE 08/16/2018 0524   KETONESUR 5 (A) 08/16/2018 0524   PROTEINUR NEGATIVE 08/16/2018 0524   UROBILINOGEN 1.0 05/22/2012 2229   NITRITE  NEGATIVE 08/16/2018 0524   LEUKOCYTESUR SMALL (A) 08/16/2018 0524   Sepsis Labs Invalid input(s): PROCALCITONIN,  WBC,  LACTICIDVEN Microbiology Recent Results (from the past 240 hour(s))  Surgical PCR screen     Status: Abnormal   Collection Time: 12/23/18  7:58 PM  Result Value Ref Range Status   MRSA, PCR POSITIVE (A) NEGATIVE Final    Comment: RESULT CALLED TO, READ BACK BY AND VERIFIED WITH: M,STUTTON AT 0112 ON 12/24/18 BY A,MOHAMED    Staphylococcus aureus POSITIVE (A) NEGATIVE Final    Comment: (NOTE) The Xpert SA Assay (FDA approved for NASAL specimens in patients 86 years of age and older), is one component of a comprehensive surveillance program. It is not intended to diagnose infection nor to guide or monitor treatment. Performed at [facility], [place] [address], [place], [place] [address]   SARS Coronavirus 2 (CEPHEID - Performed in [facility] hospital lab), Hosp Order     Status: None   Collection Time: 12/31/18 11:36 AM  Result Value Ref Range Status   SARS Coronavirus 2 NEGATIVE NEGATIVE Final    Comment: (NOTE) If result is NEGATIVE SARS-CoV-2 target nucleic acids are NOT DETECTED. The SARS-CoV-2 RNA is generally detectable in upper and lower  respiratory specimens during the acute phase of infection. The lowest  concentration of SARS-CoV-2 viral copies this assay can detect is 250  copies / mL. A negative result does not preclude SARS-CoV-2 infection  and should not be used as the sole basis for treatment or other  patient management decisions.  A negative result may  occur with  improper specimen collection / handling, submission of specimen other  than nasopharyngeal swab, presence of viral mutation(s) within the  areas targeted by this assay, and inadequate number of viral copies  (<250 copies / mL). A negative result must be combined with clinical  observations, patient history, and epidemiological information. If result is  POSITIVE SARS-CoV-2 target nucleic acids are DETECTED. The SARS-CoV-2 RNA is generally detectable in upper and lower  respiratory specimens dur ing the acute phase of infection.  Positive  results are indicative of active infection with SARS-CoV-2.  Clinical  correlation with patient history and other diagnostic information is  necessary to determine patient infection status.  Positive results do  not rule out bacterial infection or co-infection with other viruses. If result is PRESUMPTIVE POSTIVE SARS-CoV-2 nucleic acids MAY BE PRESENT.   A presumptive positive result was obtained on the submitted specimen  and confirmed on repeat testing.  While 2019 novel coronavirus  (SARS-CoV-2) nucleic acids may be present in the submitted sample  additional confirmatory testing may be necessary for epidemiological  and / or clinical management purposes  to differentiate between  SARS-CoV-2 and other Sarbecovirus currently known to infect humans.  If clinically indicated additional testing with an alternate test  methodology [phone]) is advised. The SARS-CoV-2 RNA is generally  detectable in upper and lower respiratory sp ecimens during the acute  phase of infection. The expected result is Negative. Fact Sheet for Patients:  StrictlyIdeas.no Fact Sheet for Healthcare Providers: BankingDealers.co.za This test is not yet approved or cleared by the [place] FDA and has been authorized for detection and/or diagnosis of SARS-CoV-2 by FDA under an Emergency Use Authorization (EUA).  This EUA will remain in effect (meaning this test can be used) for the duration of the COVID-19 declaration under Section 564(b)(1) of the Act, 21 U.S.C. section 360bbb-3(b)(1), unless the authorization is terminated or revoked sooner. Performed at [facility], [place] [address], [place], [place] [address]    Time coordinating discharge: 35  minutes  SIGNED:  Kerney Elbe, DO [facility] 01/02/2019, 2:44 PM Pager is on [place]  If 7PM-7AM, please contact night-coverage [email or web address] Password [email or web address]

## 2019-01-02 NOTE — Progress Notes (Signed)
Occupational Therapy Treatment Patient Details Name: Jenna Marquez MRN: [id number] DOB: April 22, 1946 Today's Date: 01/02/2019    History of present illness 73 y.o. BF PMHx anxiety, major depressive disorder, COPD, chronic respiratory failure with hypoxia, tobacco abuse, EtOH abuse, high-grade AV block S/P pacemaker, paroxysmal atrial fibrillation not on blood thinners), RBBB, CKD stage III who was ambulatory at baseline living with her niece admitted after a fall while trying to transfer to chair. Dx of R IT fracture of femur, s/p IM nail, possible R partial quad tendon rupture   OT comments  Pt with intermittent levels of agitation this session and fluctuating levels of confusion noted. Pt requiring encouragement to participate in therapy session as well as gentle redirection. Pt performing simple grooming and self-feeding ADL with setup/minguard assist. Additional focus on RUE A/AAROM as noted pt with recent reports of R shoulder pain. Feel SNF recommendation remains appropriate at this time. Will continue to follow acutely.   Follow Up Recommendations  SNF;Supervision/Assistance - 24 hour    Equipment Recommendations  Other (comment)(TBD)    Recommendations for Other Services      Precautions / Restrictions Precautions Precautions: Fall Required Braces or Orthoses: Knee Immobilizer - Right Knee Immobilizer - Right: On at all times Restrictions Weight Bearing Restrictions: No RLE Weight Bearing: Weight bearing as tolerated Other Position/Activity Restrictions: WBAT       Mobility Bed Mobility Overal bed mobility: Needs Assistance                                                                                 ADL either performed or assessed with clinical judgement   ADL Overall ADL's : Needs assistance/impaired Eating/Feeding: Set up;Sitting Eating/Feeding Details (indicate cue type and reason): pt attempting to drink her coffee without  taking lid off requiring cues to draw attention to this and to assist with removal Grooming: Wash/dry face;Set up;Min guard;Bed level                                                         Cognition Arousal/Alertness: Awake/alert Behavior During Therapy: Agitated;Restless Overall Cognitive Status: Impaired/Different from baseline Area of Impairment: Following commands;Safety/judgement;Problem solving;Memory                     Memory: Decreased short-term memory Following Commands: Follows one step commands with increased time;Follows one step commands inconsistently Safety/Judgement: Decreased awareness of safety;Decreased awareness of deficits   Problem Solving: Slow processing;Difficulty sequencing;Requires tactile cues;Requires verbal cues General Comments: pt with confusion this AM, pt was looking for a spider "near her grits" upon entering room however question accuracy of this, somewhat tangential speech at times and pt asking questions unrelated to current situation; fluctating levels of agitation throughout session        Exercises Exercises: Other exercises Shoulder Exercises Shoulder Flexion: AROM;AAROM;Right;5 reps Other Exercises Other Exercises: shoulder shrugs x10, shoulder roll (posteriorly) x5    Shoulder Instructions       General Comments      Pertinent  Vitals/ Pain       Pain Assessment: Faces Faces Pain Scale: Hurts little more Pain Location: R shoulder Pain Descriptors / Indicators: Guarding;Grimacing Pain Intervention(s): Monitored during session  Home Living                                          Prior Functioning/Environment              Frequency  Min 2X/week        Progress Toward Goals  OT Goals(current goals can now be found in the care plan section)  Progress towards OT goals: Progressing toward goals  Acute Rehab OT Goals Patient Stated Goal: to walk OT Goal Formulation:  With patient Time For Goal Achievement: 01/09/19  Plan Discharge plan remains appropriate    Co-evaluation                 AM-PAC OT "6 Clicks" Daily Activity     Outcome Measure   Help from another person eating meals?: A Little Help from another person taking care of personal grooming?: A Little Help from another person toileting, which includes using toliet, bedpan, or urinal?: Total Help from another person bathing (including washing, rinsing, drying)?: A Lot Help from another person to put on and taking off regular upper body clothing?: A Lot Help from another person to put on and taking off regular lower body clothing?: Total 6 Click Score: 12    End of Session    OT Visit Diagnosis: Unsteadiness on feet (R26.81);Muscle weakness (generalized) (M62.81);Other abnormalities of gait and mobility (R26.89);History of falling (Z91.81)   Activity Tolerance Patient tolerated treatment well;Treatment limited secondary to agitation   Patient Left in bed;with call bell/phone within reach;with bed alarm set   Nurse Communication Mobility status        Time: 5284-1324 OT Time Calculation (min): 16 min  Charges: OT General Charges $OT Visit: 1 Visit OT Treatments $Self Care/Home Management : 8-22 mins  Lou Cal, [place] Pager [phone] Office [place] 01/02/2019, 11:36 AM

## 2019-01-02 NOTE — Progress Notes (Signed)
Report called to [facility] at [facility]. Pt transferred via PTAR to facility. Nephew, Quinton, informed of discharge, per request. Pt tearful at discharge.

## 2019-01-02 NOTE — TOC Transition Note (Signed)
Transition of Care [facility]) - CM/SW Discharge Note   Patient Details  Name: TAUNA MACFARLANE MRN: [id number] Date of Birth: 02/02/1946  Transition of Care [facility]) CM/SW Contact:  Joaquin Courts, RN Phone Number: 01/02/2019, 3:15 PM   Clinical Narrative:    Patient to discharge to [facility]. Bed availability confirmed.  PTAR called to transport.  Bedside RN states will communicate with family member when [facility] arrives to pick up patient.    Final next level of care: Skilled Nursing Facility Barriers to Discharge: No Barriers Identified   Patient Goals and CMS Choice Patient states their goals for this hospitalization and ongoing recovery are:: Patient prefers to go to SNF now CMS Medicare.gov Compare Post Acute Care list provided to:: Patient Choice offered to / list presented to : Patient  Discharge Placement                       Discharge Plan and Services In-house Referral: Clinical Social Work Discharge Planning Services: CM Consult Post Acute Care Choice: [facility]          DME Arranged: Walker rolling DME Agency: [employer]       HH Arranged: RN, PT, OT, Nurse's Aide, Refused SNF, Social Work [employer] Agency: [facility]        Social Determinants of Health (SDOH) Interventions     Readmission Risk Interventions No flowsheet data found.

## 2019-01-02 NOTE — Care Management Important Message (Signed)
Important Message  Patient Details IM Letter given to Jenna Doctor RN Case Manager to present to the Patient Name: Jenna Marquez MRN: [id number] Date of Birth: 1945-10-21   Medicare Important Message Given:  Yes    Jenna Marquez 01/02/2019, 11:29 AM

## 2019-01-02 NOTE — Progress Notes (Signed)
Physical Therapy Treatment Patient Details Name: Jenna Marquez MRN: [id number] DOB: 26-Mar-1946 Today's Date: 01/02/2019    History of Present Illness 73 y.o. BF PMHx anxiety, major depressive disorder, COPD, chronic respiratory failure with hypoxia, tobacco abuse, EtOH abuse, high-grade AV block S/P pacemaker, paroxysmal atrial fibrillation not on blood thinners), RBBB, CKD stage III who was ambulatory at baseline living with her niece admitted after a fall while trying to transfer to chair. Dx of R IT fracture of femur, s/p IM nail, possible R partial quad tendon rupture    PT Comments    Pt received in bed and was agreeable to PT treatment. Pt slightly agitated during entire session stating that PT was rough with her and being sassy, even though PT was very kind and patient with her. Pt continually talked about the ants in her bed, in her room, and crawling on her. She even became emotional at one point during therapy asking for her mother. PT consoled pt and reassured her that there weren't any ants and she was safe. Max A x2 still required for all mobility and standing balance. R knee buckling when pt attempted to WB thru it. Attempted short sidesteps at EOB but unable due to pain and weakness. Pt returned to bed per pt request with max A x2. Continue to recommend SNF upon d/c due to significant impairments in functional mobility, strength, balance, and gait.   Follow Up Recommendations  SNF;Supervision/Assistance - 24 hour     Equipment Recommendations  Rolling walker with 5" wheels;3in1 (PT)    Recommendations for Other Services       Precautions / Restrictions Precautions Precautions: Fall Required Braces or Orthoses: Knee Immobilizer - Right Knee Immobilizer - Right: On at all times Restrictions Weight Bearing Restrictions: No RLE Weight Bearing: Weight bearing as tolerated Other Position/Activity Restrictions: WBAT    Mobility  Bed Mobility Overal bed mobility: Needs  Assistance Bed Mobility: Supine to Sit     Supine to sit: Max assist;+2 for physical assistance;+2 for safety/equipment;HOB elevated Sit to supine: Max assist;+2 for physical assistance;+2 for safety/equipment;HOB elevated   General bed mobility comments: Max assist +2 for trunk elevation, LE management, and scootign to EOB with use of bed pad. Pt vocalizing in pain with mobility, cues to sit up straight and maintain sitting balance EOB, min-mod A to reduce posterior lean  Transfers Overall transfer level: Needs assistance Equipment used: 2 person hand held assist Transfers: Sit to/from Stand Sit to Stand: +2 physical assistance;Max assist         General transfer comment: Max assist +2 for sit to stand to power up and maintain balance once standing. Improved standing posture this date but noted RLE buckling when WB thru it. Attempted sidestepping at EOB but unable due to weakness and pain  Ambulation/Gait                 Stairs             Wheelchair Mobility    Modified Rankin (Stroke Patients Only)       Balance Overall balance assessment: Needs assistance Sitting-balance support: Feet supported;Bilateral upper extremity supported Sitting balance-Leahy Scale: Fair Sitting balance - Comments: fair/poor sitting balance     Standing balance-Leahy Scale: Zero Standing balance comment: dependent on PT and PT aide for support                            Cognition Arousal/Alertness: Awake/alert Behavior  During Therapy: Agitated;Restless Overall Cognitive Status: Impaired/Different from baseline Area of Impairment: Following commands;Safety/judgement;Problem solving;Memory                     Memory: Decreased short-term memory Following Commands: Follows one step commands with increased time;Follows one step commands inconsistently Safety/Judgement: Decreased awareness of safety;Decreased awareness of deficits   Problem Solving: Slow  processing;Difficulty sequencing;Requires tactile cues;Requires verbal cues General Comments: pt more alert this date, but continues to be slightly agitated with PT. Frequently told PT that she was being too rough and that PT was being too sassy with her, even though PT was being very nice and cautious with the patient. Pt also c/o ants crawling in her bed, in the room, and on her back and arms.      Exercises Shoulder Exercises Shoulder Flexion: AROM;AAROM;Right;5 reps Other Exercises Other Exercises: shoulder shrugs x10, shoulder roll (posteriorly) x5     General Comments        Pertinent Vitals/Pain Pain Assessment: Faces Faces Pain Scale: Hurts little more Pain Location: R shoulder Pain Descriptors / Indicators: Guarding;Grimacing Pain Intervention(s): Limited activity within patient's tolerance;Monitored during session;Repositioned    Home Living                      Prior Function            PT Goals (current goals can now be found in the care plan section) Acute Rehab PT Goals Patient Stated Goal: to walk Time For Goal Achievement: 01/08/19 Potential to Achieve Goals: Good    Frequency    Min 3X/week      PT Plan      Co-evaluation              AM-PAC PT "6 Clicks" Mobility   Outcome Measure  Help needed turning from your back to your side while in a flat bed without using bedrails?: Total Help needed moving from lying on your back to sitting on the side of a flat bed without using bedrails?: Total Help needed moving to and from a bed to a chair (including a wheelchair)?: Total Help needed standing up from a chair using your arms (e.g., wheelchair or bedside chair)?: Total Help needed to walk in hospital room?: Total Help needed climbing 3-5 steps with a railing? : Total 6 Click Score: 6    End of Session Equipment Utilized During Treatment: Right knee immobilizer Activity Tolerance: Patient limited by pain;Patient limited by  fatigue;Patient tolerated treatment well Patient left: with call bell/phone within reach;in bed;with bed alarm set Nurse Communication: Mobility status PT Visit Diagnosis: History of falling (Z91.81);Difficulty in walking, not elsewhere classified (R26.2);Pain Pain - part of body: Hip     Time: 9702-6378 PT Time Calculation (min) (ACUTE ONLY): 19 min  Charges:  $Therapeutic Activity: 8-22 mins                        Geraldine Solar PT, DPT

## 2019-01-03 DIAGNOSIS — I480 Paroxysmal atrial fibrillation: Secondary | ICD-10-CM | POA: Diagnosis not present

## 2019-01-03 DIAGNOSIS — F4322 Adjustment disorder with anxiety: Secondary | ICD-10-CM | POA: Diagnosis not present

## 2019-01-03 DIAGNOSIS — J449 Chronic obstructive pulmonary disease, unspecified: Secondary | ICD-10-CM | POA: Diagnosis not present

## 2019-01-03 DIAGNOSIS — J9601 Acute respiratory failure with hypoxia: Secondary | ICD-10-CM | POA: Diagnosis not present

## 2019-01-08 DIAGNOSIS — L209 Atopic dermatitis, unspecified: Secondary | ICD-10-CM | POA: Diagnosis not present

## 2019-01-08 DIAGNOSIS — I739 Peripheral vascular disease, unspecified: Secondary | ICD-10-CM | POA: Diagnosis not present

## 2019-01-08 DIAGNOSIS — M79662 Pain in left lower leg: Secondary | ICD-10-CM | POA: Diagnosis not present

## 2019-01-08 DIAGNOSIS — R230 Cyanosis: Secondary | ICD-10-CM | POA: Diagnosis not present

## 2019-01-09 ENCOUNTER — Telehealth: Payer: Self-pay

## 2019-01-09 DIAGNOSIS — M79672 Pain in left foot: Secondary | ICD-10-CM | POA: Diagnosis not present

## 2019-01-09 DIAGNOSIS — M79675 Pain in left toe(s): Secondary | ICD-10-CM | POA: Diagnosis not present

## 2019-01-09 DIAGNOSIS — I739 Peripheral vascular disease, unspecified: Secondary | ICD-10-CM | POA: Diagnosis not present

## 2019-01-09 DIAGNOSIS — R230 Cyanosis: Secondary | ICD-10-CM | POA: Diagnosis not present

## 2019-01-09 NOTE — Telephone Encounter (Signed)
Tammy at [facility] called and said that she needed to get her in to be seen this week if possible rather than next week as her toes have gotten much worse.   Per Tammy, toes discolored and looking very angry. She is worried about her having increased issues with them especially if she goes another week without being seen.   Appt moved to tomorrow with Vinnie Level.   York Cerise, CMA

## 2019-01-10 ENCOUNTER — Other Ambulatory Visit: Payer: Self-pay

## 2019-01-10 ENCOUNTER — Ambulatory Visit (HOSPITAL_COMMUNITY)
Admission: RE | Admit: 2019-01-10 | Discharge: 2019-01-10 | Disposition: A | Discharge status: Home / Self Care | Payer: PPO | Source: Ambulatory Visit | Attending: Family | Admitting: Family

## 2019-01-10 ENCOUNTER — Ambulatory Visit: Payer: Self-pay | Admitting: Pulmonary Disease

## 2019-01-10 ENCOUNTER — Encounter: Payer: Self-pay | Admitting: Family

## 2019-01-10 ENCOUNTER — Ambulatory Visit (INDEPENDENT_AMBULATORY_CARE_PROVIDER_SITE_OTHER): Payer: PPO | Admitting: Family

## 2019-01-10 ENCOUNTER — Encounter (HOSPITAL_COMMUNITY): Payer: PPO

## 2019-01-10 ENCOUNTER — Encounter: Payer: Self-pay | Admitting: *Deleted

## 2019-01-10 VITALS — BP 166/77 | HR 76 | Temp 98.8°F | Resp 14 | Ht 66.5 in | Wt 140.0 lb

## 2019-01-10 DIAGNOSIS — Z87891 Personal history of nicotine dependence: Secondary | ICD-10-CM | POA: Diagnosis not present

## 2019-01-10 DIAGNOSIS — I779 Disorder of arteries and arterioles, unspecified: Secondary | ICD-10-CM | POA: Diagnosis not present

## 2019-01-10 DIAGNOSIS — I998 Other disorder of circulatory system: Secondary | ICD-10-CM

## 2019-01-10 DIAGNOSIS — I96 Gangrene, not elsewhere classified: Secondary | ICD-10-CM | POA: Diagnosis not present

## 2019-01-10 NOTE — Progress Notes (Signed)
[facility] [place]   CC: Evaluation of dark left toes   History of Present Illness Jenna Marquez is a 73 y.o. female who is status post right IM nailing on 12/24/2018 for closed displaced comminuted intertrochanteric fracture of right femur. Pt states she fell at home and sustained the right LE fracture; states she stubbed her left toes as she was falling.  Pt states she thinks her left toes have improved since the injury, feels her left toes are less dark.  Pt states her left 3rd, 4th, and 5th toes are painful, relived by an analgesic given at the rehab center.  She states that she had no problems with her left toes until she fell requiring the right IM nailing.   She is referred by Dr. Darrick Meigs for evaluation of bluish discoloration of left 2 toes/? cyanosis-likely from injury, tender to palpation, peripheral pulses palpable at posterior tibial on left during her hospitalization for the IM nailing.  Dorsalis pedis was not palpable.  ABIs obtained at that time showed moderate peripheral vascular disease in left lower extremity.   Discharge Diagnoses from the 12-24-18 right IM nailing:  1. Pulmonary edema-likely acute diastolic CHF, IV fluids were discontinued .Echocardiogram from 12/23/2018 showed EF 71%, diastolic function could not be assessed due to underlying A. fib.  Patient was started on Lasix 40 mg IV every 12 hours.  She has diuresed well and is currently off oxygen.  Lasix was discontinued.  Chest x-ray showsIncreasing bibasilar infiltrates,?  Pneumonia.  Patient is afebrile.  Clinically does not appear to have pneumonia.  Would not start antibiotics at this time.  Continue Lasix 40 mg p.o. daily.  2. Closed displaced comminuted intertrochanteric fracture of right femur-orthopedic surgery following.  Status post IM nailing on 12/24/2018. Plan for discharge to skilled nursing facility for rehab.  Lovenox for DVT prophylaxis as per Ortho.  3. Bluish discoloration of  left 2 toes/? cyanosis-likely from injury, tender to palpation, peripheral pulses palpable at posterior tibial on left.  Dorsalis pedis was not palpable.  ABIs obtained showed moderate peripheral vascular disease in left lower extremity.  Vascular surgery has seen the patient and will follow-up as outpatient.  4. Acute blood loss anemia-in setting of IM nailing, also fluid overload.  Improved from yesterday, today hemoglobin is 8.3.  Follow CBC in a.m.  5. Paroxysmal atrial fibrillation-history of Mobitz type II second-degree AV block status post pacemaker-patient currently in normal sinus rhythm.  Continue metoprolol.Patient is not on anticoagulation.As per note from February 2018 from Dr. Verlon Au patient had refused anticoagulation.  6. Hypomagnesemia-Magnesium was 1.7. After replacement magnesium is 1.9.  7. Suicidal ideation-patient had suicidal thoughts during hospitalization and wanted to die.  One-to-one sitter was placed.  Psychiatry was consulted, Psychiatry has signed off.  Patient does not need inpatient psychiatric admission.  She is not risk for self or others as per psych note.  Will discontinue sitter.  8. Acute kidney injury-resolved  Patient is agreeable to go to skilled facility for rehab.    Pt returns today for left LE arterial duplex and peripheral vascular evaluation; ABI's done on 12-27-18.   She states that her right hip incision is healing nicely.  She is residing at [facility] on [address] prior to this pt states she lived in her own home.   Diabetic: No Tobacco use: former smoker, quit in 2013, smoked x 35 years   Pt meds include: Statin : pt states she does not take,  is on her med list Betablocker: Yes ASA: No Other anticoagulants/antiplatelets: Patient is not on anticoagulation.As per note from February 2018 from Dr. Verlon Au patient had refused anticoagulation. Has hx of PAF. But she states that she is receiving Lovenox injections in  the rehab center twice daily.   Past Medical History:  Diagnosis Date   Abnormal mammogram 07/20/2011   Acute respiratory failure with hypoxia ([place]) 09/30/2016   Alcohol abuse 02/19/2018   ALLERGIC RHINITIS 10/13/2010   Qualifier: Diagnosis of  By: Ronnald Ramp CMA, [place]     Anxiety    "occasionally"   ASTHMA 10/13/2010   Qualifier: Diagnosis of  By: Ronnald Ramp CMA, Chemira     Blunt trauma of nose 06/06/2012   Chest pain 08/18/2011   Chest pain on breathing 05/11/2015   Chronic respiratory failure with hypoxia ([place]) 04/24/2018   CKD (chronic kidney disease) stage 2, GFR 60-89 ml/min 09/30/2016   COPD exacerbation ([place]) 08/16/2013   E. 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   HTN (hypertension) 11/24/2016   Indigestion 04/23/2012   Left knee pain 07/11/2017   Left shoulder pain 05/22/2015   Lumbar radicular pain 06/13/2012   MDD (major depressive disorder), single episode, severe , no psychosis ([place])    Pacemaker 04/22/2015   Palpitations 08/16/2013   Paroxysmal atrial fibrillation ([place]) 09/30/2016   Pneumonia 02/19/2018   PVD (peripheral vascular disease) ([place]) 04/04/2017   RBBB (right bundle branch block) 07/31/2011   Swelling of arm 05/22/2015   Swollen uvula 11/14/2014   Tinnitus of both ears 10/23/2012   Tobacco abuse    QUIT 12/13    Vagina bleeding 02/19/2018   Vitamin D deficiency 01/30/2013    Social History Social History   Tobacco Use   Smoking status: Former Smoker    Packs/day: 1.00    Years: 35.00    Pack years: 35.00    Types: Cigarettes    Last attempt to quit: 08/10/2012    Years since quitting: 6.4   Smokeless tobacco: Never Used   Tobacco comment: Started at age 46  Substance Use Topics   Alcohol use: Yes    Alcohol/week: 8.0  standard drinks    Types: 8 Glasses of wine per week   Drug use: No    Family History Family History  Problem Relation Age of Onset   Coronary artery disease Mother    Heart disease Mother        CHF   Congestive Heart Failure Mother    Diabetes Neg Hx    Cancer Neg Hx    Stroke Neg Hx    Colon cancer Neg Hx     Past Surgical History:  Procedure Laterality Date   ECTOPIC PREGNANCY SURGERY  1974   EP IMPLANTABLE DEVICE N/A 01/07/2015   Procedure: Pacemaker Implant;  Surgeon: Evans Lance, MD;  Location: [facility];  Service: Cardiovascular;  Laterality: N/A;   INTRAMEDULLARY (IM) NAIL INTERTROCHANTERIC Right 12/24/2018   Procedure: RIGHT INTRAMEDULLARY (IM) NAIL INTERTROCHANTRIC;  Surgeon: Renette Butters, MD;  Location: [facility];  Service: Orthopedics;  Laterality: Right;   PACEMAKER INSERTION     TONSILLECTOMY AND ADENOIDECTOMY  1954    Allergies  Allergen Reactions   Avelox [Moxifloxacin Hcl In Nacl] Itching and Rash    Current Outpatient Medications  Medication Sig Dispense Refill   acetaminophen (TYLENOL) 325 MG tablet Take 1-2 tablets (325-650 mg total) by mouth every 6 (six) hours as needed for mild pain (pain score 1-3 or temp > 100.5). 10 tablet 0   acyclovir ointment (ZOVIRAX) 5 % Apply 1 application topically 3 (three) times daily. (Patient taking differently: Apply 1 application topically daily as needed (for flares). ) 30 g 0   albuterol (PROAIR HFA) 108 (90 Base) MCG/ACT inhaler Inhale 1-2 puffs into the lungs every 6 (six) hours as needed for wheezing or shortness of breath. 1 Inhaler 3   albuterol (PROVENTIL) (2.5 MG/3ML) 0.083% nebulizer solution Take 3 mLs (2.5 mg total) by nebulization every 6 (six) hours as needed for wheezing or shortness of breath. 75 mL 12   ALPRAZolam (XANAX) 0.5 MG tablet TAKE 1 TAB EVERY 8 HRS AS NEEDED FOR ANXIETY, DO NOT TAKE AT NIGHT WITH HYDROXYZINE OR WITH ALCOHOL 10 tablet 0   alum & mag  hydroxide-simeth (MAALOX/MYLANTA) 200-200-20 MG/5ML suspension Take 15 mLs by mouth every 6 (six) hours as needed for indigestion or heartburn. 355 mL 0   atorvastatin (LIPITOR) 40 MG tablet Take 1 tablet (40 mg total) by mouth daily. 30 tablet 11   bisacodyl (DULCOLAX) 10 MG suppository Place 1 suppository (10 mg total) rectally daily as needed for moderate constipation. 12 suppository 0   budesonide-formoterol (SYMBICORT) 160-4.5 MCG/ACT inhaler Inhale 2 puffs into the lungs 2 (two) times daily. 3 Inhaler 1   calcium-vitamin D (OSCAL WITH D) 250-125 MG-UNIT tablet Take 1 tablet by mouth daily.     Cholecalciferol (VITAMIN D) 2000 UNITS CAPS Take 1 capsule by mouth daily.     docusate sodium (COLACE) 100 MG capsule Take 1 capsule (100 mg total) by mouth 2 (two) times daily. 10 capsule 0   enoxaparin (LOVENOX) 40 MG/0.4ML injection Inject 0.4 mLs (40 mg total) into the skin daily for 30 doses. For 30 days post op for DVT prophylaxis 30 Syringe 0   fluocinonide cream (LIDEX) 9.41 % Apply 1 application topically 2 (two) times daily.     furosemide (LASIX) 40 MG tablet Take 1 tablet (40 mg total) by mouth daily. 30 tablet 0   guaiFENesin-dextromethorphan (ROBITUSSIN DM) 100-10 MG/5ML syrup Take 5 mLs by mouth every 4 (four) hours as needed for cough (chest congestion). 118 mL 0   ipratropium (ATROVENT) 0.02 % nebulizer solution INHALE 2.5ML BY NEBULIZATION EVERY 6 HOURS AS NEEDED FOR WHEEZING OR SHORTNESS OF BREATH. (Patient taking differently: Take 0.5 mg by nebulization every 6 (six) hours as needed for wheezing or shortness of breath. ) 62.5 mL 2   methotrexate (RHEUMATREX) 2.5 MG tablet Take 12.5 mg by mouth every Monday.   0   metoprolol tartrate (LOPRESSOR) 25 MG tablet Take 1 tablet (25 mg total) by mouth 2 (two) times daily. 180 tablet 2   ondansetron (ZOFRAN) 4 MG tablet Take 1 tablet (4 mg total) by mouth every 6 (six) hours as needed for nausea. 20 tablet 0   polyethylene glycol  (MIRALAX / GLYCOLAX) 17 g packet Take 17 g by mouth daily as needed for mild constipation. 14 each 0   predniSONE (DELTASONE) 10 MG tablet 4 tabs by mouth once daily for 2 days, then 3 tabs daily x 2 days, then 2 tabs daily x 2 days, then 1 tab daily x 2 days 20 tablet 0   thiamine 100 MG tablet Take 1 tablet (100 mg total) by mouth daily.  traMADol (ULTRAM) 50 MG tablet Do not take with xanax or alcohol. RESUME AFTER HYDROCODONE-ACETAMINOPHEN is finished 10 tablet 0   No current facility-administered medications for this visit.     ROS: See HPI for pertinent positives and negatives.   Physical Examination  Vitals:   01/10/19 1531  BP: (!) 166/77  Pulse: 76  Resp: 14  Temp: 98.8 F (37.1 C)  TempSrc: Oral  SpO2: 100%  Weight: 140 lb (63.5 kg)  Height: 5' 6.5" (1.689 m)   Body mass index is 22.26 kg/m.  General: A&O x 3, WDWN, petite elderly female. Gait: seated in w/c HEENT: No gross abnormalities.  Pulmonary: Respirations are non labored, little air movement in all fields (tight), few expiratory wheezes in bases, no rales or rhonchi. Supplemental O2 via [place] in place.  Cardiac: regular rhythm, no detected murmur. Pacemaker palpated left upper chest.         Carotid Bruits Right Left   Negative Negative   Radial pulses are 2+ palpable bilaterally   Adominal aortic pulse is not palpable                         VASCULAR EXAM: Extremities with ischemic changes at left 1st, 3rd, and 4th toes, with dry Gangrene at left 3rd and 4th toes; without open wounds. 1+ pitting edema and moderate erythema at dorsal aspect left forefoot.  See photos below.       Left foot  Left foot                                                                                                           LE Pulses Right Left       FEMORAL  2+ palpable  faintly palpable        POPLITEAL  not palpable   not palpable       POSTERIOR TIBIAL  not palpable   not palpable        DORSALIS  PEDIS      ANTERIOR TIBIAL not palpable  not palpable    Abdomen: soft, NT, no palpable masses. Skin: no rashes, no cellulitis, no ulcers noted. Musculoskeletal: no muscle wasting or atrophy.  Neurologic: A&O X 3; appropriate affect, Sensation is normal except painful to touch at left forefoot; MOTOR FUNCTION:  moving all extremities equally, motor strength 5/5 in upper extremities, 3/5 in lower extremites. Speech is fluent/normal. CN 2-12 intact. Psychiatric: Thought content is normal, mood appropriate for clinical situation.     ASSESSMENT: Jenna Marquez is a 73 y.o. female who is status post right IM nailing on 12/24/2018 for closed displaced comminuted intertrochanteric fracture of right femur. She states that she stubbed her left toes as she fell during the injury that required the right IM nailing. She had no problem with her left toes until after that injury.  Her left 3rd and 4th toes have dry gangrene. By duplex of left lower extremity arteries today, she has a significant stenosis at the proximal left SFA.  Her left forefoot  is painful to touch has some erythema and mild swelling.  She is in a rehab center after he right IM nailing, is receiving Lovenox injections as DVT prophylaxis.  She has a pacemaker. She uses supplemental O2, has COPD, lungs sound tight.   I spoke with Dr. Donzetta Matters, she will need an arteriogram of her abdominal aorta and lower extremities, possible intervention of left leg artery(s), on 01-14-19 by Dr. Donzetta Matters.    DATA  Left LE Arterial Duplex (01-10-19): +-----------+--------+-----+---------------+----------+--------+  LEFT        PSV cm/s Ratio Stenosis        Waveform   Comments  +-----------+--------+-----+---------------+----------+--------+  CFA Prox    156                            biphasic             +-----------+--------+-----+---------------+----------+--------+  DFA         107                            biphasic              +-----------+--------+-----+---------------+----------+--------+  SFA Prox    626            75-99% stenosis stenotic             +-----------+--------+-----+---------------+----------+--------+  SFA Mid     159                            monophasic           +-----------+--------+-----+---------------+----------+--------+  SFA Distal  91                             monophasic           +-----------+--------+-----+---------------+----------+--------+  POP Prox    40                             monophasic           +-----------+--------+-----+---------------+----------+--------+  POP Mid     66                             monophasic           +-----------+--------+-----+---------------+----------+--------+  POP Distal  69                             monophasic           +-----------+--------+-----+---------------+----------+--------+  ATA Distal  13                             monophasic dampened  +-----------+--------+-----+---------------+----------+--------+  PTA Distal  98                             monophasic           +-----------+--------+-----+---------------+----------+--------+  PERO Distal 46                             monophasic dampened  +-----------+--------+-----+---------------+----------+--------+  Summary: Right: Mixed calcific  and noncalcific plaque noted throughout. Left: 75 to 99% stenosis, upper end of range in the proximal femoral artery. Probable tibial artery occlusive disease. Technically difficult exam due to extreme pain in toes.    ABI (Date: 12-27-18): ABI Findings: +--------+------------------+-----+---------+----------------+  Right    Rt Pressure (mmHg) Index Waveform  Comment           +--------+------------------+-----+---------+----------------+  Brachial 133                      triphasic                   +--------+------------------+-----+---------+----------------+  ATA      131                0.98  biphasic                     +--------+------------------+-----+---------+----------------+  PTA      255                1.92  biphasic  non compressible  +--------+------------------+-----+---------+----------------+  +--------+------------------+-----+----------+---------------------------------+  Left     Lt Pressure (mmHg) Index Waveform   Comment                            +--------+------------------+-----+----------+---------------------------------+  Brachial                                     unable to obtain BP due to                                                       patient position                   +--------+------------------+-----+----------+---------------------------------+  ATA      71                 0.53  monophasic                                    +--------+------------------+-----+----------+---------------------------------+  PTA      82                 0.62  monophasic                                    +--------+------------------+-----+----------+---------------------------------+  Unable to obtain toe waveforms/ pressures due to patient in pain when touched.   Summary: Right: Resting right ankle-brachial index indicates noncompressible right lower extremity arteries.  Left: Resting left ankle-brachial index indicates moderate left lower extremity arterial disease.    PLAN:  Based on the patient's vascular studies and examination, and after discussing with Dr. Donzetta Matters, pt will be scheduled for an angiogram of lower extremities, possible intervention, on 01-14-19 by Dr. Donzetta Matters.   Clemon Chambers, RN, MSN, FNP-C [facility] Phone: [phone]  Clinic MD: Donzetta Matters on call  01/10/19 3:43 PM

## 2019-01-10 NOTE — Patient Instructions (Signed)
Peripheral Vascular Disease  Peripheral vascular disease (PVD) is a disease of the blood vessels that are not part of your heart and brain. A simple term for PVD is poor circulation. In most cases, PVD narrows the blood vessels that carry blood from your heart to the rest of your body. This can reduce the supply of blood to your arms, legs, and internal organs, like your stomach or kidneys. However, PVD most often affects a person's lower legs and feet. Without treatment, PVD tends to get worse. PVD can also lead to acute ischemic limb. This is when an arm or leg suddenly cannot get enough blood. This is a medical emergency. Follow these instructions at home: Lifestyle  Do not use any products that contain nicotine or tobacco, such as cigarettes and e-cigarettes. If you need help quitting, ask your doctor.  Lose weight if you are overweight. Or, stay at a healthy weight as told by your doctor.  Eat a diet that is low in fat and cholesterol. If you need help, ask your doctor.  Exercise regularly. Ask your doctor for activities that are right for you. General instructions  Take over-the-counter and prescription medicines only as told by your doctor.  Take good care of your feet: ? Wear comfortable shoes that fit well. ? Check your feet often for any cuts or sores.  Keep all follow-up visits as told by your doctor This is important. Contact a doctor if:  You have cramps in your legs when you walk.  You have leg pain when you are at rest.  You have coldness in a leg or foot.  Your skin changes.  You are unable to get or have an erection (erectile dysfunction).  You have cuts or sores on your feet that do not heal. Get help right away if:  Your arm or leg turns cold, numb, and blue.  Your arms or legs become red, warm, swollen, painful, or numb.  You have chest pain.  You have trouble breathing.  You suddenly have weakness in your face, arm, or leg.  You become very  confused or you cannot speak.  You suddenly have a very bad headache.  You suddenly cannot see. Summary  Peripheral vascular disease (PVD) is a disease of the blood vessels.  A simple term for PVD is poor circulation. Without treatment, PVD tends to get worse.  Treatment may include exercise, low fat and low cholesterol diet, and quitting smoking. This information is not intended to replace advice given to you by your health care provider. Make sure you discuss any questions you have with your health care provider. Document Released: 10/26/2009 Document Revised: 09/08/2016 Document Reviewed: 09/08/2016 Elsevier Interactive Patient Education  2019 [employer]  

## 2019-01-11 ENCOUNTER — Other Ambulatory Visit: Payer: Self-pay

## 2019-01-11 DIAGNOSIS — I96 Gangrene, not elsewhere classified: Secondary | ICD-10-CM

## 2019-01-14 ENCOUNTER — Other Ambulatory Visit: Payer: Self-pay

## 2019-01-14 ENCOUNTER — Ambulatory Visit (HOSPITAL_COMMUNITY)
Admission: RE | Disposition: A | Discharge status: Skilled Nursing Facility | Payer: Self-pay | Source: Home / Self Care | Attending: Vascular Surgery

## 2019-01-14 ENCOUNTER — Ambulatory Visit (HOSPITAL_COMMUNITY)
Admission: RE | Admit: 2019-01-14 | Discharge: 2019-01-14 | Disposition: A | Discharge status: Skilled Nursing Facility | Payer: PPO | Attending: Vascular Surgery | Admitting: Vascular Surgery

## 2019-01-14 DIAGNOSIS — I480 Paroxysmal atrial fibrillation: Secondary | ICD-10-CM | POA: Insufficient documentation

## 2019-01-14 DIAGNOSIS — N182 Chronic kidney disease, stage 2 (mild): Secondary | ICD-10-CM | POA: Diagnosis not present

## 2019-01-14 DIAGNOSIS — Z1159 Encounter for screening for other viral diseases: Secondary | ICD-10-CM | POA: Diagnosis not present

## 2019-01-14 DIAGNOSIS — Z79899 Other long term (current) drug therapy: Secondary | ICD-10-CM | POA: Insufficient documentation

## 2019-01-14 DIAGNOSIS — Z950 Presence of cardiac pacemaker: Secondary | ICD-10-CM | POA: Diagnosis not present

## 2019-01-14 DIAGNOSIS — K219 Gastro-esophageal reflux disease without esophagitis: Secondary | ICD-10-CM | POA: Insufficient documentation

## 2019-01-14 DIAGNOSIS — J449 Chronic obstructive pulmonary disease, unspecified: Secondary | ICD-10-CM | POA: Diagnosis not present

## 2019-01-14 DIAGNOSIS — I70268 Atherosclerosis of native arteries of extremities with gangrene, other extremity: Secondary | ICD-10-CM | POA: Insufficient documentation

## 2019-01-14 DIAGNOSIS — F419 Anxiety disorder, unspecified: Secondary | ICD-10-CM | POA: Insufficient documentation

## 2019-01-14 DIAGNOSIS — Z87891 Personal history of nicotine dependence: Secondary | ICD-10-CM | POA: Diagnosis not present

## 2019-01-14 DIAGNOSIS — I129 Hypertensive chronic kidney disease with stage 1 through stage 4 chronic kidney disease, or unspecified chronic kidney disease: Secondary | ICD-10-CM | POA: Diagnosis not present

## 2019-01-14 DIAGNOSIS — I70245 Atherosclerosis of native arteries of left leg with ulceration of other part of foot: Secondary | ICD-10-CM | POA: Diagnosis not present

## 2019-01-14 DIAGNOSIS — I96 Gangrene, not elsewhere classified: Secondary | ICD-10-CM

## 2019-01-14 HISTORY — PX: PERIPHERAL VASCULAR BALLOON ANGIOPLASTY: CATH118281

## 2019-01-14 HISTORY — PX: PERIPHERAL VASCULAR ATHERECTOMY: CATH118256

## 2019-01-14 HISTORY — PX: ABDOMINAL AORTOGRAM W/LOWER EXTREMITY: CATH118223

## 2019-01-14 LAB — POCT I-STAT, CHEM 8
BUN: 14 mg/dL (ref 8–23)
Calcium, Ion: 1.05 mmol/L — ABNORMAL LOW (ref 1.15–1.40)
Chloride: 104 mmol/L (ref 98–111)
Creatinine, Ser: 0.7 mg/dL (ref 0.44–1.00)
Glucose, Bld: 97 mg/dL (ref 70–99)
HCT: 35 % — ABNORMAL LOW (ref 36.0–46.0)
Hemoglobin: 11.9 g/dL — ABNORMAL LOW (ref 12.0–15.0)
Potassium: 4.3 mmol/L (ref 3.5–5.1)
Sodium: 142 mmol/L (ref 135–145)
TCO2: 34 mmol/L — ABNORMAL HIGH (ref 22–32)

## 2019-01-14 LAB — SARS CORONAVIRUS 2 BY RT PCR (HOSPITAL ORDER, PERFORMED IN ~~LOC~~ HOSPITAL LAB): SARS Coronavirus 2: NEGATIVE

## 2019-01-14 LAB — POCT ACTIVATED CLOTTING TIME: Activated Clotting Time: 208 seconds

## 2019-01-14 SURGERY — ABDOMINAL AORTOGRAM W/LOWER EXTREMITY
Anesthesia: LOCAL | Laterality: Left

## 2019-01-14 MED ORDER — HEPARIN SODIUM (PORCINE) 1000 UNIT/ML IJ SOLN
INTRAMUSCULAR | Status: DC | PRN
  Administered 2019-01-14: 5000 [IU] via INTRAVENOUS
  Administered 2019-01-14: 3000 [IU] via INTRAVENOUS

## 2019-01-14 MED ORDER — CLOPIDOGREL BISULFATE 75 MG PO TABS: 75.0000 mg | ORAL_TABLET | Freq: Every day | ORAL | 11 refills | Status: DC

## 2019-01-14 MED ORDER — HYDRALAZINE HCL 20 MG/ML IJ SOLN: 5.0000 mg | INTRAMUSCULAR | Status: DC | PRN

## 2019-01-14 MED ORDER — DIPHENHYDRAMINE HCL 50 MG/ML IJ SOLN
INTRAMUSCULAR | Status: AC
  Filled 2019-01-14: qty 1

## 2019-01-14 MED ORDER — LIDOCAINE HCL (PF) 1 % IJ SOLN
INTRAMUSCULAR | Status: AC
  Filled 2019-01-14: qty 30

## 2019-01-14 MED ORDER — ONDANSETRON HCL 4 MG/2ML IJ SOLN: 4.0000 mg | Freq: Four times a day (QID) | INTRAMUSCULAR | Status: DC | PRN

## 2019-01-14 MED ORDER — FENTANYL CITRATE (PF) 100 MCG/2ML IJ SOLN
INTRAMUSCULAR | Status: AC
  Filled 2019-01-14: qty 2

## 2019-01-14 MED ORDER — MIDAZOLAM HCL 2 MG/2ML IJ SOLN
INTRAMUSCULAR | Status: AC
  Filled 2019-01-14: qty 2

## 2019-01-14 MED ORDER — SODIUM CHLORIDE 0.9 % IV SOLN: 250.0000 mL | INTRAVENOUS | Status: DC | PRN

## 2019-01-14 MED ORDER — NITROGLYCERIN 1 MG/10 ML FOR IR/CATH LAB
INTRA_ARTERIAL | Status: DC | PRN
  Administered 2019-01-14: 200 ug via INTRA_ARTERIAL

## 2019-01-14 MED ORDER — ALBUTEROL SULFATE HFA 108 (90 BASE) MCG/ACT IN AERS: 2.0000 | INHALATION_SPRAY | RESPIRATORY_TRACT | Status: DC

## 2019-01-14 MED ORDER — SODIUM CHLORIDE 0.9% FLUSH: 3.0000 mL | Freq: Two times a day (BID) | INTRAVENOUS | Status: DC

## 2019-01-14 MED ORDER — HEPARIN (PORCINE) IN NACL 1000-0.9 UT/500ML-% IV SOLN
INTRAVENOUS | Status: AC
  Filled 2019-01-14: qty 500

## 2019-01-14 MED ORDER — CLOPIDOGREL BISULFATE 75 MG PO TABS: 75.0000 mg | ORAL_TABLET | Freq: Every day | ORAL | Status: DC

## 2019-01-14 MED ORDER — VERAPAMIL HCL 2.5 MG/ML IV SOLN
INTRAVENOUS | Status: AC
  Filled 2019-01-14: qty 2

## 2019-01-14 MED ORDER — OXYCODONE HCL 5 MG PO TABS: 5.0000 mg | ORAL_TABLET | ORAL | Status: DC | PRN

## 2019-01-14 MED ORDER — LABETALOL HCL 5 MG/ML IV SOLN: 10.0000 mg | INTRAVENOUS | Status: DC | PRN

## 2019-01-14 MED ORDER — LABETALOL HCL 5 MG/ML IV SOLN
INTRAVENOUS | Status: DC | PRN
  Administered 2019-01-14: 10 mg via INTRAVENOUS
  Administered 2019-01-14: 20 mg via INTRAVENOUS

## 2019-01-14 MED ORDER — VIPERSLIDE LUBRICANT OPTIME
TOPICAL | Status: DC | PRN
  Administered 2019-01-14: 11:00:00 via SURGICAL_CAVITY

## 2019-01-14 MED ORDER — MORPHINE SULFATE (PF) 10 MG/ML IV SOLN: 2.0000 mg | Freq: Once | INTRAVENOUS | Status: DC

## 2019-01-14 MED ORDER — SODIUM CHLORIDE 0.9% FLUSH: 3.0000 mL | INTRAVENOUS | Status: DC | PRN

## 2019-01-14 MED ORDER — MORPHINE SULFATE (PF) 10 MG/ML IV SOLN: 2.0000 mg | INTRAVENOUS | Status: DC | PRN

## 2019-01-14 MED ORDER — IODIXANOL 320 MG/ML IV SOLN
INTRAVENOUS | Status: DC | PRN
  Administered 2019-01-14: 13:00:00 175 mL via INTRAVENOUS

## 2019-01-14 MED ORDER — LABETALOL HCL 5 MG/ML IV SOLN
INTRAVENOUS | Status: AC
  Filled 2019-01-14: qty 4

## 2019-01-14 MED ORDER — MORPHINE SULFATE (PF) 2 MG/ML IV SOLN
INTRAVENOUS | Status: AC
  Administered 2019-01-14: 2 mg via INTRAVENOUS
  Filled 2019-01-14: qty 1

## 2019-01-14 MED ORDER — ACETAMINOPHEN 325 MG PO TABS: 650.0000 mg | ORAL_TABLET | ORAL | Status: DC | PRN

## 2019-01-14 MED ORDER — FENTANYL CITRATE (PF) 100 MCG/2ML IJ SOLN
INTRAMUSCULAR | Status: DC | PRN
  Administered 2019-01-14 (×2): 12.5 ug via INTRAVENOUS
  Administered 2019-01-14: 25 ug via INTRAVENOUS

## 2019-01-14 MED ORDER — CLOPIDOGREL BISULFATE 75 MG PO TABS
300.0000 mg | ORAL_TABLET | Freq: Once | ORAL | Status: AC
  Administered 2019-01-14: 13:00:00 300 mg via ORAL
  Filled 2019-01-14: qty 4

## 2019-01-14 MED ORDER — SODIUM CHLORIDE 0.9 % IV SOLN
INTRAVENOUS | Status: DC
  Administered 2019-01-14: 09:00:00 via INTRAVENOUS

## 2019-01-14 MED ORDER — SODIUM CHLORIDE 0.9 % WEIGHT BASED INFUSION: 1.0000 mL/kg/h | INTRAVENOUS | Status: DC

## 2019-01-14 MED ORDER — HEPARIN SODIUM (PORCINE) 1000 UNIT/ML IJ SOLN
INTRAMUSCULAR | Status: AC
  Filled 2019-01-14: qty 1

## 2019-01-14 MED ORDER — CLOPIDOGREL BISULFATE 75 MG PO TABS: 75.0000 mg | ORAL_TABLET | Freq: Every day | ORAL | 11 refills | Status: AC

## 2019-01-14 MED ORDER — MIDAZOLAM HCL 2 MG/2ML IJ SOLN
INTRAMUSCULAR | Status: DC | PRN
  Administered 2019-01-14: 0.5 mg via INTRAVENOUS

## 2019-01-14 SURGICAL SUPPLY — 33 items
BALLN ADMIRAL INPACT 5X250 (BALLOONS) ×3
BALLN COYOTE OTW 2.5X220X150 (BALLOONS) ×3
BALLN STERLING OTW 5X220X150 (BALLOONS) ×3
BALLOON ADMIRAL INPACT 5X250 (BALLOONS) IMPLANT
BALLOON COYOTE OTW 2.5X220X150 (BALLOONS) IMPLANT
BALLOON STERLING OTW 5X220X150 (BALLOONS) IMPLANT
BUR DBK EXCH CART 200 CLA 145 (BURR) IMPLANT
BURR DBK EXCH CART 200 CLA 145 (BURR) ×3
CATH CXI SUPP 2.6F 150 ST (CATHETERS) ×1 IMPLANT
CATH OMNI FLUSH 5F 65CM (CATHETERS) ×1 IMPLANT
CATH QUICKCROSS .018X135CM (MICROCATHETER) ×1 IMPLANT
CATH QUICKCROSS ANG SELECT (CATHETERS) ×1 IMPLANT
CATH STRAIGHT 5FR 65CM (CATHETERS) ×1 IMPLANT
CATH TEMPO AQUA 5F 100CM (CATHETERS) ×1 IMPLANT
CLOSURE MYNX CONTROL 6F/7F (Vascular Products) ×1 IMPLANT
CROWN 1.25 MICRO 145 DIAMONDBK (BURR) ×1 IMPLANT
GLIDEWIRE ADV .035X180CM (WIRE) ×1 IMPLANT
KIT MICROPUNCTURE NIT STIFF (SHEATH) ×1 IMPLANT
KIT PV (KITS) ×3 IMPLANT
LUBRICANT VIPERSLIDE CORONARY (MISCELLANEOUS) ×1 IMPLANT
SHEATH FLEX ANSEL ANG 6F 45CM (SHEATH) ×1 IMPLANT
SHEATH HIGHFLEX ANSEL 6FRX55 (SHEATH) ×1 IMPLANT
SHEATH PINNACLE 5F 10CM (SHEATH) ×1 IMPLANT
SHEATH PINNACLE 6F 10CM (SHEATH) ×1 IMPLANT
STOPCOCK MORSE 400PSI 3WAY (MISCELLANEOUS) ×1 IMPLANT
SYR MEDRAD MARK V 150ML (SYRINGE) ×1 IMPLANT
TRANSDUCER W/STOPCOCK (MISCELLANEOUS) ×3 IMPLANT
TRAY PV CATH (CUSTOM PROCEDURE TRAY) ×3 IMPLANT
WIRE AMPLATZ SS-J .035X180CM (WIRE) ×1 IMPLANT
WIRE BENTSON .035X145CM (WIRE) ×1 IMPLANT
WIRE G V18X300CM (WIRE) ×3 IMPLANT
WIRE ROSEN-J .035X260CM (WIRE) ×1 IMPLANT
WIRE VIPER WIRECTO 0.014 (WIRE) ×1 IMPLANT

## 2019-01-14 NOTE — Op Note (Signed)
Patient name: Jenna Marquez MRN: [id number] DOB: 11-04-1945 Sex: female  01/14/2019 Pre-operative Diagnosis: Critical left lower extremity ischemia with toe ulcerations Post-operative diagnosis:  Same Surgeon:  Erlene Quan C. Donzetta Matters, MD Procedure Performed: 1.  Ultrasound-guided cannulation right common femoral artery 2.  Aortogram bilateral lower extremity runoff 3.  CSI atherectomy left SFA with 2.0 classic 4.  Drug-coated balloon angioplasty left SFA with 5 x 250 mm in.pact 5.  CSI atherectomy left posterior tibial artery with 1.25 micro 6.  Balloon angioplasty left posterior tibial artery with 2.5 mm balloon 7.  Moderate sedation with fentanyl and Versed for 139 minutes 8.  Minx device closure right common femoral artery   Indications: 73 year old female with recent IM nail of her right femur.  She is now in a rehab facility stubbed her left sided toes has had progressive gangrenous changes.  She is now indicated for angiogram possible intervention on the left.  Findings: The aorta was heavily calcified.  The right side which was not the site of interest there is SFA moderate grade disease without flow-limiting stenosis.  Runoff is via the posterior tibial artery to the ankle and foot.  The left side which is the area of interest has heavy calcification proximal SFA which is nearly occluded.  There are multiple approximately 60% stenoses throughout the rest of the SFA.  Anterior tibial and peroneal arteries run to the level of the ankle and give out.  There is not appear to be any flow into the foot.  The posterior tibial artery is patent to the mid calf and then appears to reconstitute distal foot.  After intervention there is no further stenoses in the SFA.  Unfortunately after intervention the posterior tibial artery we still do not have outflow into the foot.  Patient will remain high risk for left lower extremity amputation.    Procedure:  The patient was identified in the holding area  and taken to room 8.  The patient was then placed supine on the table and prepped and draped in the usual sterile fashion.  A time out was called.  Ultrasound was used to evaluate the right common femoral artery today to be patent.  The area was anesthetized 1% lidocaine cannulated on direct ultrasound visualization with micropuncture needle followed by wire and sheath.  Images saved the permanent record.  Bentson wire was placed followed by 5 [place] sheath.  Omni catheter was placed to level 1 aortogram was performed.  Bilateral extremity angiography was performed with above findings.  We then crossed the bifurcation with Bentson wire Omni catheter attempted to place a long sheath.  Unfortunately sheath would not track.  We then exchanged for a stiff wire ultimately and Amplatz wire to get the sheath into the left common femoral artery patient was fully heparinized ACT returned 200 she was given additional 3000.  We then used V 18 to cross the stenoses in the SFA confirmed intraluminal access distally.  We exchanged for a Viper wire.  CSI atherectomy was performed with 2.0 classic of the SFA.  The SFA was then predilated with a 5 mm balloon and drug-coated balloon angioplasty was used for the entire the SFA disease segments.  Completion angiogram demonstrated no residual stenosis.  We turned our attention distally.  V 18 and quick cross select were used to get in the posterior tibial artery.  V 18 tract distally we were able to confirm intraluminal access with the foot however there was no runoff.  We performed  CSI atherectomy with 1.25 micro of the entire PT artery.  We then balloon dilated the PT with 2.5 mm balloon.  Completion angiogram and straight had no significant runoff to the foot in any of the vessels we could not get any filling of the posterior tibial.  We then performed another balloon angioplasty of posterior tibial artery this time the entire PT using low pressures 4 atm were previously we used the  8 atm nominal pressure.  Completion still showed no flow into the foot.  Given that she did not have flow in the foot on preintervention angiogram and elected not to proceed any further.  We changed for short 6 French sheath deployed a minx device and pressure was held.  She did have a signal of the posterior tibial artery at the ankle.  Patient will remain high risk for lower extremity amputation.   Contrast: 175cc  Fernandez Kenley C. Donzetta Matters, MD [facility] Office: [phone] Pager: [phone]

## 2019-01-14 NOTE — Progress Notes (Signed)
   I evaluated patient in postoperative holding area.  She has signals to the level of the ankle both in the posterior tibial and peroneal arteries.  This is consistent with her angiographic findings with no flow into her left foot.  She has pain from the midfoot distally this is also consistent with her preoperative exam.  In discussing with her daughter-in-law patient would not want any amputation and would rather die.  She is actually requesting physician assisted suicide which I have discussed with her is not possible.  We will discharge her back to her rehab facility see her in the next week or 10 days to evaluate how well her toes are demarcating and have further discussion regarding amputation of her toes which could have poor healing given the lack of flow into her foot versus possible palliative care.  Chelbi Herber C. Donzetta Matters, MD [facility] Office: [phone] Pager: [phone]

## 2019-01-14 NOTE — H&P (Signed)
   History and Physical Update  The patient was interviewed and re-examined.  The patient's previous History and Physical has been reviewed and is unchanged from recent office visit. Plan for aortogram with possible intervention left lower extremity.  Sylwia Cuervo C. Donzetta Matters, MD [facility] Office: [phone] Pager: [phone]   01/14/2019, 8:20 AM

## 2019-01-14 NOTE — Discharge Instructions (Signed)
Clopidogrel tablets °What is this medicine? °CLOPIDOGREL (kloh PID oh grel) helps to prevent blood clots. This medicine is used to prevent heart attack, stroke, or other vascular events in people who are at high risk. °This medicine may be used for other purposes; ask your health care provider or pharmacist if you have questions. °COMMON BRAND NAME(S): Plavix °What should I tell my health care provider before I take this medicine? °They need to know if you have any of the following conditions: °-bleeding disorders °-bleeding in the brain °-having surgery °-history of stomach bleeding °-an unusual or allergic reaction to clopidogrel, other medicines, foods, dyes, or preservatives °-pregnant or trying to get pregnant °-breast-feeding °How should I use this medicine? °Take this medicine by mouth with a glass of water. Follow the directions on the prescription label. You may take this medicine with or without food. If it upsets your stomach, take it with food. Take your medicine at regular intervals. Do not take it more often than directed. Do not stop taking except on your doctor's advice. °A special MedGuide will be given to you by the pharmacist with each prescription and refill. Be sure to read this information carefully each time. °Talk to your pediatrician regarding the use of this medicine in children. Special care may be needed. °Overdosage: If you think you have taken too much of this medicine contact a poison control center or emergency room at once. °NOTE: This medicine is only for you. Do not share this medicine with others. °What if I miss a dose? °If you miss a dose, take it as soon as you can. If it is almost time for your next dose, take only that dose. Do not take double or extra doses. °What may interact with this medicine? °Do not take this medicine with the following medications: °-dasabuvir; ombitasvir; paritaprevir; ritonavir °-defibrotide °-selexipag °This medicine may also interact with the  following medications: °-certain medicines that treat or prevent blood clots like warfarin °-narcotic medicines for pain °-NSAIDs, medicines for pain and inflammation, like ibuprofen or naproxen °-repaglinide °-SNRIs, medicines for depression, like desvenlafaxine, duloxetine, levomilnacipran, venlafaxine °-SSRIs, medicines for depression, like citalopram, escitalopram, fluoxetine, fluvoxamine, paroxetine, sertraline °-stomach acid blockers like cimetidine, esomeprazole, omeprazole °This list may not describe all possible interactions. Give your health care provider a list of all the medicines, herbs, non-prescription drugs, or dietary supplements you use. Also tell them if you smoke, drink alcohol, or use illegal drugs. Some items may interact with your medicine. °What should I watch for while using this medicine? °Visit your doctor or health care professional for regular check-ups. Do not stop taking your medicine unless your doctor tells you to. °Notify your doctor or health care professional and seek emergency treatment if you develop breathing problems; changes in vision; chest pain; severe, sudden headache; pain, swelling, warmth in the leg; trouble speaking; sudden numbness or weakness of the face, arm or leg. These can be signs that your condition has gotten worse. °If you are going to have surgery or dental work, tell your doctor or health care professional that you are taking this medicine. °Certain genetic factors may reduce the effect of this medicine. Your doctor may use genetic tests to determine treatment. °Only take aspirin if you are instructed to. Low doses of aspirin are used with this medicine to treat some conditions. Taking aspirin with this medicine can increase your risk of bleeding so you must be careful. Talk to your doctor or pharmacist if you have questions. °What side effects   may I notice from receiving this medicine? °Side effects that you should report to your doctor or health care  professional as soon as possible: °-allergic reactions like skin rash, itching or hives, swelling of the face, lips, or tongue °-signs and symptoms of bleeding such as bloody or black, tarry stools; red or dark-brown urine; spitting up blood or brown material that looks like coffee grounds; red spots on the skin; unusual bruising or bleeding from the eye, gums, or nose °-signs and symptoms of a blood clot such as breathing problems; changes in vision; chest pain; severe, sudden headache; pain, swelling, warmth in the leg; trouble speaking; sudden numbness or weakness of the face, arm or leg °-signs and symptoms of low blood sugar such as feeling anxious; confusion; dizziness; increased hunger; unusually weak or tired; increased sweating; shakiness; cold, clammy skin; irritable; headache; blurred vision; fast heartbeat; loss of consciousness °Side effects that usually do not require medical attention (report to your doctor or health care professional if they continue or are bothersome): °-constipation °-diarrhea °-headache °-upset stomach °This list may not describe all possible side effects. Call your doctor for medical advice about side effects. You may report side effects to FDA at 1-800-FDA-1088. °Where should I keep my medicine? °Keep out of the reach of children. °Store at room temperature of 59 to 86 degrees F (15 to 30 degrees C). Throw away any unused medicine after the expiration date. °NOTE: This sheet is a summary. It may not cover all possible information. If you have questions about this medicine, talk to your doctor, pharmacist, or health care provider. °© 2019 [employer]vier/[employer] Standard (2018-01-01 15:03:38) °Angiogram, Care After °This sheet gives you information about how to care for yourself after your procedure. Your health care provider may also give you more specific instructions. If you have problems or questions, contact your health care provider. °What can I expect after the procedure? °After the  procedure, it is common to have bruising and tenderness at the catheter insertion area. °Follow these instructions at home: °Insertion site care °· Follow instructions from your health care provider about how to take care of your insertion site. Make sure you: °? Wash your hands with soap and water before you change your bandage (dressing). If soap and water are not available, use hand sanitizer. °? Change your dressing as told by your health care provider. °? Leave stitches (sutures), skin glue, or adhesive strips in place. These skin closures may need to stay in place for 2 weeks or longer. If adhesive strip edges start to loosen and curl up, you may trim the loose edges. Do not remove adhesive strips completely unless your health care provider tells you to do that. °· Do not take baths, swim, or use a hot tub until your health care provider approves. °· You may shower 24-48 hours after the procedure or as told by your health care provider. °? Gently wash the site with plain soap and water. °? Pat the area dry with a clean towel. °? Do not rub the site. This may cause bleeding. °· Do not apply powder or lotion to the site. Keep the site clean and dry. °· Check your insertion site every day for signs of infection. Check for: °? Redness, swelling, or pain. °? Fluid or blood. °? Warmth. °? Pus or a bad smell. °Activity °· Rest as told by your health care provider, usually for 1-2 days. °· Do not lift anything that is heavier than 10 lbs. (4.5 kg) or   as told by your health care provider. °· Do not drive for 24 hours if you were given a medicine to help you relax (sedative). °· Do not drive or use heavy machinery while taking prescription pain medicine. °General instructions ° °· Return to your normal activities as told by your health care provider, usually in about a week. Ask your health care provider what activities are safe for you. °· If the catheter site starts bleeding, lie flat and put pressure on the site. If  the bleeding does not stop, get help right away. This is a medical emergency. °· Drink enough fluid to keep your urine clear or pale yellow. This helps flush the contrast dye from your body. °· Take over-the-counter and prescription medicines only as told by your health care provider. °· Keep all follow-up visits as told by your health care provider. This is important. °Contact a health care provider if: °· You have a fever or chills. °· You have redness, swelling, or pain around your insertion site. °· You have fluid or blood coming from your insertion site. °· The insertion site feels warm to the touch. °· You have pus or a bad smell coming from your insertion site. °· You have bruising around the insertion site. °· You notice blood collecting in the tissue around the catheter site (hematoma). The hematoma may be painful to the touch. °Get help right away if: °· You have severe pain at the catheter insertion area. °· The catheter insertion area swells very fast. °· The catheter insertion area is bleeding, and the bleeding does not stop when you hold steady pressure on the area. °· The area near or just beyond the catheter insertion site becomes pale, cool, tingly, or numb. °These symptoms may represent a serious problem that is an emergency. Do not wait to see if the symptoms will go away. Get medical help right away. Call your local emergency services (911 in the [place] Do not drive yourself to the hospital. °Summary °· After the procedure, it is common to have bruising and tenderness at the catheter insertion area. °· After the procedure, it is important to rest and drink plenty of fluids. °· Do not take baths, swim, or use a hot tub until your health care provider says it is okay to do so. You may shower 24-48 hours after the procedure or as told by your health care provider. °· If the catheter site starts bleeding, lie flat and put pressure on the site. If the bleeding does not stop, get help right away. This  is a medical emergency. °This information is not intended to replace advice given to you by your health care provider. Make sure you discuss any questions you have with your health care provider. °Document Released: 02/17/2005 Document Revised: 07/06/2016 Document Reviewed: 07/06/2016 °Elsevier Interactive Patient Education © 2019 [employer] ° °

## 2019-01-15 ENCOUNTER — Encounter (HOSPITAL_COMMUNITY): Payer: Self-pay | Admitting: Vascular Surgery

## 2019-01-15 DIAGNOSIS — R230 Cyanosis: Secondary | ICD-10-CM | POA: Diagnosis not present

## 2019-01-15 DIAGNOSIS — M79675 Pain in left toe(s): Secondary | ICD-10-CM | POA: Diagnosis not present

## 2019-01-15 DIAGNOSIS — M79672 Pain in left foot: Secondary | ICD-10-CM | POA: Diagnosis not present

## 2019-01-15 DIAGNOSIS — I739 Peripheral vascular disease, unspecified: Secondary | ICD-10-CM | POA: Diagnosis not present

## 2019-01-15 MED FILL — Lidocaine HCl Local Preservative Free (PF) Inj 1%: INTRAMUSCULAR | Qty: 30 | Status: AC

## 2019-01-15 MED FILL — Heparin Sod (Porcine)-NaCl IV Soln 1000 Unit/500ML-0.9%: INTRAVENOUS | Qty: 1000 | Status: AC

## 2019-01-15 MED FILL — Diphenhydramine HCl Inj 50 MG/ML: INTRAMUSCULAR | Qty: 1 | Status: AC

## 2019-01-16 DIAGNOSIS — M79672 Pain in left foot: Secondary | ICD-10-CM | POA: Diagnosis not present

## 2019-01-16 DIAGNOSIS — M79675 Pain in left toe(s): Secondary | ICD-10-CM | POA: Diagnosis not present

## 2019-01-16 DIAGNOSIS — R230 Cyanosis: Secondary | ICD-10-CM | POA: Diagnosis not present

## 2019-01-16 DIAGNOSIS — I739 Peripheral vascular disease, unspecified: Secondary | ICD-10-CM | POA: Diagnosis not present

## 2019-01-17 ENCOUNTER — Encounter (HOSPITAL_COMMUNITY): Payer: PPO

## 2019-01-17 ENCOUNTER — Ambulatory Visit: Payer: PPO | Admitting: Vascular Surgery

## 2019-01-17 ENCOUNTER — Telehealth: Payer: Self-pay | Admitting: *Deleted

## 2019-01-17 DIAGNOSIS — M79675 Pain in left toe(s): Secondary | ICD-10-CM | POA: Diagnosis not present

## 2019-01-17 DIAGNOSIS — D72829 Elevated white blood cell count, unspecified: Secondary | ICD-10-CM | POA: Diagnosis not present

## 2019-01-17 DIAGNOSIS — I739 Peripheral vascular disease, unspecified: Secondary | ICD-10-CM | POA: Diagnosis not present

## 2019-01-17 DIAGNOSIS — R509 Fever, unspecified: Secondary | ICD-10-CM | POA: Diagnosis not present

## 2019-01-17 NOTE — Telephone Encounter (Signed)
Unable to reach patient to confirm in office OV. Phone rings then hangs up

## 2019-01-18 DIAGNOSIS — M79672 Pain in left foot: Secondary | ICD-10-CM | POA: Diagnosis not present

## 2019-01-18 DIAGNOSIS — D72829 Elevated white blood cell count, unspecified: Secondary | ICD-10-CM | POA: Diagnosis not present

## 2019-01-18 DIAGNOSIS — M79675 Pain in left toe(s): Secondary | ICD-10-CM | POA: Diagnosis not present

## 2019-01-18 DIAGNOSIS — R509 Fever, unspecified: Secondary | ICD-10-CM | POA: Diagnosis not present

## 2019-01-21 DIAGNOSIS — M79672 Pain in left foot: Secondary | ICD-10-CM | POA: Diagnosis not present

## 2019-01-21 DIAGNOSIS — F05 Delirium due to known physiological condition: Secondary | ICD-10-CM | POA: Diagnosis not present

## 2019-01-21 DIAGNOSIS — R230 Cyanosis: Secondary | ICD-10-CM | POA: Diagnosis not present

## 2019-01-21 DIAGNOSIS — M79675 Pain in left toe(s): Secondary | ICD-10-CM | POA: Diagnosis not present

## 2019-01-22 ENCOUNTER — Other Ambulatory Visit: Payer: Self-pay

## 2019-01-22 ENCOUNTER — Encounter: Payer: Self-pay | Admitting: Cardiology

## 2019-01-22 ENCOUNTER — Ambulatory Visit (INDEPENDENT_AMBULATORY_CARE_PROVIDER_SITE_OTHER): Payer: PPO | Admitting: Cardiology

## 2019-01-22 VITALS — BP 96/66 | HR 100

## 2019-01-22 DIAGNOSIS — I480 Paroxysmal atrial fibrillation: Secondary | ICD-10-CM

## 2019-01-22 DIAGNOSIS — I442 Atrioventricular block, complete: Secondary | ICD-10-CM | POA: Diagnosis not present

## 2019-01-22 DIAGNOSIS — Z950 Presence of cardiac pacemaker: Secondary | ICD-10-CM

## 2019-01-22 LAB — CUP PACEART INCLINIC DEVICE CHECK
Battery Impedance: 230 Ohm
Battery Remaining Longevity: 125 mo
Battery Voltage: 2.79 V
Brady Statistic AP VP Percent: 1 %
Brady Statistic AP VS Percent: 5 %
Brady Statistic AS VP Percent: 14 %
Brady Statistic AS VS Percent: 80 %
Date Time Interrogation Session: 20200609160402
Implantable Lead Implant Date: 20160525
Implantable Lead Implant Date: 20160525
Implantable Lead Location: 753859
Implantable Lead Location: 753860
Implantable Lead Model: 5076
Implantable Lead Model: 5076
Implantable Pulse Generator Implant Date: 20160525
Lead Channel Impedance Value: 486 Ohm
Lead Channel Impedance Value: 537 Ohm
Lead Channel Pacing Threshold Amplitude: 0.5 V
Lead Channel Pacing Threshold Amplitude: 0.75 V
Lead Channel Pacing Threshold Amplitude: 0.75 V
Lead Channel Pacing Threshold Amplitude: 0.75 V
Lead Channel Pacing Threshold Pulse Width: 0.4 ms
Lead Channel Pacing Threshold Pulse Width: 0.4 ms
Lead Channel Pacing Threshold Pulse Width: 0.4 ms
Lead Channel Pacing Threshold Pulse Width: 0.4 ms
Lead Channel Sensing Intrinsic Amplitude: 2 mV
Lead Channel Sensing Intrinsic Amplitude: 5.6 mV
Lead Channel Setting Pacing Amplitude: 1.5 V
Lead Channel Setting Pacing Amplitude: 2.5 V
Lead Channel Setting Pacing Pulse Width: 0.4 ms
Lead Channel Setting Sensing Sensitivity: 2 mV

## 2019-01-22 MED ORDER — METOPROLOL TARTRATE 50 MG PO TABS: 50.0000 mg | ORAL_TABLET | Freq: Two times a day (BID) | ORAL | 1 refills | Status: AC

## 2019-01-22 NOTE — Progress Notes (Signed)
Electrophysiology Office Note   Date:  01/22/2019   ID:  Jenna Marquez, DOB 08/28/1945, MRN [id number]  PCP:  Darreld Mclean, MD  Cardiologist:  Lovena Le Primary Electrophysiologist:  Will Meredith Leeds, MD  CC: 2:1 AV block   History of Present Illness: Jenna Marquez is a 73 y.o. female who presents today for electrophysiology evaluation.   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   She presents today for regular follow up. Last seen 09/2017. She is a poor historian. She remembers her fall, but is adamant she did not break anything. She denies symptoms of palpitations, CP, SOB, orthopnea, PND, LE edema, claudication, dizziness, presyncope, syncope, bleeding, or neurologic sequela. She is tolerating her medications without difficulties. She is overall feeling well after recent admission for toe gangrene. She is a resident at [facility], and not very active.   Past Medical History:  Diagnosis Date  . Abnormal mammogram 07/20/2011  . Acute respiratory failure with hypoxia ([place]) 09/30/2016  . Alcohol abuse 02/19/2018  . ALLERGIC RHINITIS 10/13/2010   Qualifier: Diagnosis of  By: Ronnald Ramp CMA, [place]    . Anxiety    "occasionally"  . ASTHMA 10/13/2010   Qualifier: Diagnosis of  By: Ronnald Ramp CMA, [place]    . Blunt trauma of nose 06/06/2012  . Chest pain 08/18/2011  . Chest pain on breathing 05/11/2015  . Chronic respiratory failure with hypoxia ([place]) 04/24/2018  . CKD (chronic kidney disease) stage 2, GFR 60-89 ml/min 09/30/2016  . COPD exacerbation ([place]) 08/16/2013  . E. coli UTI 02/22/2018  . Elevated BP 02/23/2012  . Generalized anxiety disorder 01/11/2012  . GERD (gastroesophageal reflux disease)   . Head injury 10/23/2012  . 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 Hemoptysis 02/19/2018  . 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 HTN (hypertension) 11/24/2016  . Indigestion 04/23/2012  . Left knee pain 07/11/2017  .  Left shoulder pain 05/22/2015  . Lumbar radicular pain 06/13/2012  . MDD (major depressive disorder), single episode, severe , no psychosis ([place])   . Pacemaker 04/22/2015  . Palpitations 08/16/2013  . Paroxysmal atrial fibrillation ([place]) 09/30/2016  . Pneumonia 02/19/2018  . PVD (peripheral vascular disease) ([place]) 04/04/2017  . RBBB (right bundle branch block) 07/31/2011  . Swelling of arm 05/22/2015  . Swollen uvula 11/14/2014  . Tinnitus of both ears 10/23/2012  . Tobacco abuse    QUIT 12/13   . Vagina bleeding 02/19/2018  . Vitamin D deficiency 01/30/2013   Past Surgical History:  Procedure Laterality Date  . ABDOMINAL AORTOGRAM W/LOWER EXTREMITY Bilateral 01/14/2019   Procedure: ABDOMINAL AORTOGRAM W/LOWER EXTREMITY;  Surgeon: Waynetta Sandy, MD;  Location: [place] [facility];  Service: Cardiovascular;  Laterality: Bilateral;  . [place]  . EP IMPLANTABLE DEVICE N/A 01/07/2015   Procedure: Pacemaker Implant;  Surgeon: Evans Lance, MD;  Location: [place] [facility];  Service: Cardiovascular;  Laterality: N/A;  . INTRAMEDULLARY (IM) NAIL INTERTROCHANTERIC Right 12/24/2018   Procedure: RIGHT INTRAMEDULLARY (IM) NAIL INTERTROCHANTRIC;  Surgeon: Renette Butters, MD;  Location: [facility];  Service: Orthopedics;  Laterality: Right;  . PACEMAKER INSERTION    . PERIPHERAL VASCULAR ATHERECTOMY Left 01/14/2019   Procedure: PERIPHERAL VASCULAR ATHERECTOMY;  Surgeon: Waynetta Sandy, MD;  Location: [place] [facility];  Service: Cardiovascular;  Laterality: Left;  superficial femoral, posterior tibial  . PERIPHERAL VASCULAR BALLOON ANGIOPLASTY Left 01/14/2019   Procedure: PERIPHERAL  VASCULAR BALLOON ANGIOPLASTY;  Surgeon: Waynetta Sandy, MD;  Location: [place] [facility];  Service: Cardiovascular;  Laterality: Left;  PT  . TONSILLECTOMY AND ADENOIDECTOMY  1956     Current Outpatient Medications  Medication Sig Dispense Refill  . acetaminophen (TYLENOL) 325  MG tablet Take 1-2 tablets (325-650 mg total) by mouth every 6 (six) hours as needed for mild pain (pain score 1-3 or temp > 100.5). (Patient taking differently: Take 650 mg by mouth every 6 (six) hours as needed for mild pain (pain score 1-3 or temp > 100.5). ) 10 tablet 0  . acyclovir ointment (ZOVIRAX) 5 % Apply 1 application topically 3 (three) times daily. (Patient not taking: Reported on 01/14/2019) 30 g 0  . albuterol (PROAIR HFA) 108 (90 Base) MCG/ACT inhaler Inhale 1-2 puffs into the lungs every 6 (six) hours as needed for wheezing or shortness of breath. 1 Inhaler 3  . albuterol (PROVENTIL) (2.5 MG/3ML) 0.083% nebulizer solution Take 3 mLs (2.5 mg total) by nebulization every 6 (six) hours as needed for wheezing or shortness of breath. 75 mL 12  . ALPRAZolam (XANAX) 0.5 MG tablet TAKE 1 TAB EVERY 8 HRS AS NEEDED FOR ANXIETY, DO NOT TAKE AT NIGHT WITH HYDROXYZINE OR WITH ALCOHOL (Patient not taking: Reported on 01/14/2019) 10 tablet 0  . alum & mag hydroxide-simeth (MAALOX/MYLANTA) 200-200-20 MG/5ML suspension Take 15 mLs by mouth every 6 (six) hours as needed for indigestion or heartburn. 355 mL 0  . atorvastatin (LIPITOR) 40 MG tablet Take 1 tablet (40 mg total) by mouth daily. 30 tablet 11  . bisacodyl (DULCOLAX) 10 MG suppository Place 1 suppository (10 mg total) rectally daily as needed for moderate constipation. 12 suppository 0  . budesonide-formoterol (SYMBICORT) 160-4.5 MCG/ACT inhaler Inhale 2 puffs into the lungs 2 (two) times daily. 3 Inhaler 1  . calcium-vitamin D (OSCAL WITH D) 250-125 MG-UNIT tablet Take 1 tablet by mouth daily.    . Cholecalciferol (VITAMIN D) 2000 UNITS CAPS Take 1 capsule by mouth daily.    . clopidogrel (PLAVIX) 75 MG tablet Take 1 tablet (75 mg total) by mouth daily. 30 tablet 11  . docusate sodium (COLACE) 100 MG capsule Take 1 capsule (100 mg total) by mouth 2 (two) times daily. 10 capsule 0  . enoxaparin (LOVENOX) 40 MG/0.4ML injection Inject 0.4 mLs (40 mg  total) into the skin daily for 30 doses. For 30 days post op for DVT prophylaxis 30 Syringe 0  . furosemide (LASIX) 40 MG tablet Take 1 tablet (40 mg total) by mouth daily. 30 tablet 0  . guaiFENesin-dextromethorphan (ROBITUSSIN DM) 100-10 MG/5ML syrup Take 5 mLs by mouth every 4 (four) hours as needed for cough (chest congestion). 118 mL 0  . HYDROcodone-acetaminophen (NORCO/VICODIN) 5-325 MG tablet Take 2 tablets by mouth every 6 (six) hours as needed for severe pain. For seven days Order ends 6.4.20    . ipratropium (ATROVENT) 0.02 % nebulizer solution INHALE 2.5ML BY NEBULIZATION EVERY 6 HOURS AS NEEDED FOR WHEEZING OR SHORTNESS OF BREATH. (Patient taking differently: Take 0.5 mg by nebulization every 6 (six) hours as needed for wheezing or shortness of breath. ) 62.5 mL 2  . methotrexate (RHEUMATREX) 2.5 MG tablet Take 12.5 mg by mouth every Monday.   0  . metoprolol tartrate (LOPRESSOR) 25 MG tablet Take 1 tablet (25 mg total) by mouth 2 (two) times daily. 180 tablet 2  . ondansetron (ZOFRAN) 4 MG tablet Take 1 tablet (4 mg total) by mouth every  6 (six) hours as needed for nausea. 20 tablet 0  . polyethylene glycol (MIRALAX / GLYCOLAX) 17 g packet Take 17 g by mouth daily as needed for mild constipation. 14 each 0  . predniSONE (DELTASONE) 10 MG tablet 4 tabs by mouth once daily for 2 days, then 3 tabs daily x 2 days, then 2 tabs daily x 2 days, then 1 tab daily x 2 days (Patient not taking: Reported on 01/14/2019) 20 tablet 0  . thiamine 100 MG tablet Take 1 tablet (100 mg total) by mouth daily.    . traMADol (ULTRAM) 50 MG tablet Do not take with xanax or alcohol. RESUME AFTER HYDROCODONE-ACETAMINOPHEN is finished (Patient not taking: Reported on 01/14/2019) 10 tablet 0   No current facility-administered medications for this visit.     Allergies:   Avelox [moxifloxacin hcl in nacl]   Social History:  The patient  reports that she quit smoking about 6 years ago. Her smoking use included  cigarettes. She has a 35.00 pack-year smoking history. She has never used smokeless tobacco. She reports current alcohol use of about 8.0 standard drinks of alcohol per week. She reports that she does not use drugs.   Family History:  The patient's family history includes Congestive Heart Failure in her mother; Coronary artery disease in her mother; Heart disease in her mother.   Review of systems complete and found to be negative unless listed in HPI.  Physical Exam: Vitals:   01/22/19 1527  BP: 96/66  Pulse: 100   GEN- The patient is elderly and chronically ill appearing. Alert and oriented x 3 today.   Head- normocephalic, atraumatic Eyes-  Sclera clear, conjunctiva pink Ears- hearing intact Oropharynx- clear Neck- supple,  Lungs- Clear to ausculation bilaterally, normal work of breathing Chest- pacemaker pocket is without hematoma/ bruit Heart- Regular rhythm, rapid rhythm, no murmurs, rubs or gallops, PMI not laterally displaced GI- soft, NT, ND, + BS Extremities- no clubbing, cyanosis, or edema Neuro- strength and sensation are intact  Pacemaker interrogation- reviewed in detail today,  See paper chart/Paceart report  EKG:  EKG is not ordered today.  Recent Labs: 02/18/2018: B Natriuretic Peptide 136.4 10/15/2018: Pro B Natriuretic peptide (BNP) 150.0; TSH 2.88 01/02/2019: ALT 14; Magnesium 1.6; Platelets 654 01/14/2019: BUN 14; Creatinine, Ser 0.70; Hemoglobin 11.9; Potassium 4.3; Sodium 142    Lipid Panel     Component Value Date/Time   CHOL 220 (H) 11/24/2016 1035   TRIG 52.0 11/24/2016 1035   HDL 129.40 11/24/2016 1035   CHOLHDL 2 11/24/2016 1035   VLDL 10.4 11/24/2016 1035   LDLCALC 81 11/24/2016 1035   LDLDIRECT 114.2 01/30/2013 1133     Wt Readings from Last 3 Encounters:  01/14/19 138 lb (62.6 kg)  01/10/19 140 lb (63.5 kg)  01/02/19 140 lb 14 oz (63.9 kg)      Other studies Reviewed: Additional studies/ records that were reviewed today include:  TTE  01/07/15  Review of the above records today demonstrates:  - Left ventricle: The cavity size was normal. Wall thickness was normal. The estimated ejection fraction was 65%. Wall motion was normal; there were no regional wall motion abnormalities. - Aortic valve: Sclerosis without stenosis. There was no significant regurgitation. - Right ventricle: The cavity size was normal. Lateral annulus peak S velocity: 19.4 cm/s.   ASSESSMENT AND PLAN:  1.  2:1 AV block: Tronic dual-chamber pacemaker implanted 01/07/15 device functioning appropriately.  She is currently tracking her high atrial rates. She has poor  histograms due to length of follow up. Changed to AAI-DDD today (Rate response off). Will follow up 3 month to re-assess.     2. Paroxysmal atrial fibrillation: Has refused [place] in the past, and now with falls. She had 61 high ventricular rates, but only 2 EGMs to review. One appearing true NSVT and the other appeared to be one-to-one tachycardia on her device. We will increase metoprolol to 50 mg BID.   This patients CHA2DS2-VASc Score and unadjusted Ischemic Stroke Rate (% per year) is equal to 2.2 % stroke rate/year from a score of 2  Above score calculated as 1 point each if present [CHF, HTN, DM, Vascular=MI/PAD/Aortic Plaque, Age if 65-74, or Female] Above score calculated as 2 points each if present [Age > 75, or Stroke/TIA/TE]    Current medicines are reviewed at length with the patient today.   The patient does not have concerns regarding her medicines.  The following changes were made today: Increase metoprolol  Labs/ tests ordered today include:  No orders of the defined types were placed in this encounter.  Disposition:   FU with in 3 months.   Jacalyn Lefevre, PA-C  01/22/2019 2:42 PM     [place] [address] [phone] (office) [phone] (fax)  I have seen and examined this patient with  Jenna Marquez.  Agree with above, note added to reflect my findings.  On exam, RRR, tachycardic, no murmurs, lungs clear.  Patient presents to the clinic with what appears to be an atrial tachycardia.  Will increase metoprolol.  Unfortunately she has not had follow-up on her device in the last few years.  We will clear her settings and reassess in 3 months to see what her histogram shows.  Will M. Camnitz MD 01/22/2019 4:08 PM

## 2019-01-22 NOTE — Patient Instructions (Addendum)
Medication Instructions:  Your physician has recommended you make the following change in your medication: 1. INCREASE Lopressor (Metoprolol Tartrate) to 50 mg twice a day  * If you need a refill on your cardiac medications before your next appointment, please call your pharmacy.   Labwork: None ordered  Testing/Procedures: None ordered  Follow-Up: Your physician recommends that you schedule a follow-up appointment in: 3 months with Dr. Curt Bears.   Thank you for choosing [facility]!!   Trinidad Curet, RN [phone]

## 2019-01-23 ENCOUNTER — Telehealth: Payer: Self-pay

## 2019-01-23 DIAGNOSIS — M79675 Pain in left toe(s): Secondary | ICD-10-CM | POA: Diagnosis not present

## 2019-01-23 DIAGNOSIS — M79662 Pain in left lower leg: Secondary | ICD-10-CM | POA: Diagnosis not present

## 2019-01-23 DIAGNOSIS — M87078 Idiopathic aseptic necrosis of left toe(s): Secondary | ICD-10-CM | POA: Diagnosis not present

## 2019-01-23 DIAGNOSIS — M79672 Pain in left foot: Secondary | ICD-10-CM | POA: Diagnosis not present

## 2019-01-23 NOTE — Telephone Encounter (Signed)
Tammy with [facility] called and said that pt needs an earlier appt ASAP she said that her foot has gotten much worse and pt is agreeable to surgery now.   Had a wound check scheduled for 19th - moved up due to symptoms worsening. tammy said that her foot looks horrible.Marland Kitchen   Appt made for pt to be seen on Friday - advised if she gets worse to go to the ER.   York Cerise, CMA

## 2019-01-24 DIAGNOSIS — M87078 Idiopathic aseptic necrosis of left toe(s): Secondary | ICD-10-CM | POA: Diagnosis not present

## 2019-01-24 DIAGNOSIS — M79675 Pain in left toe(s): Secondary | ICD-10-CM | POA: Diagnosis not present

## 2019-01-24 DIAGNOSIS — M79662 Pain in left lower leg: Secondary | ICD-10-CM | POA: Diagnosis not present

## 2019-01-24 DIAGNOSIS — M79672 Pain in left foot: Secondary | ICD-10-CM | POA: Diagnosis not present

## 2019-01-25 ENCOUNTER — Ambulatory Visit: Payer: PPO

## 2019-01-25 DIAGNOSIS — M87078 Idiopathic aseptic necrosis of left toe(s): Secondary | ICD-10-CM | POA: Diagnosis not present

## 2019-01-25 DIAGNOSIS — M79675 Pain in left toe(s): Secondary | ICD-10-CM | POA: Diagnosis not present

## 2019-01-25 DIAGNOSIS — M79672 Pain in left foot: Secondary | ICD-10-CM | POA: Diagnosis not present

## 2019-01-25 DIAGNOSIS — L03116 Cellulitis of left lower limb: Secondary | ICD-10-CM | POA: Diagnosis not present

## 2019-01-28 ENCOUNTER — Encounter (HOSPITAL_COMMUNITY): Payer: Self-pay | Admitting: *Deleted

## 2019-01-28 ENCOUNTER — Other Ambulatory Visit: Payer: Self-pay

## 2019-01-28 ENCOUNTER — Ambulatory Visit (INDEPENDENT_AMBULATORY_CARE_PROVIDER_SITE_OTHER): Payer: Self-pay | Admitting: Physician Assistant

## 2019-01-28 VITALS — BP 116/68 | HR 70 | Temp 97.8°F | Resp 14 | Ht 66.0 in | Wt 135.0 lb

## 2019-01-28 DIAGNOSIS — F419 Anxiety disorder, unspecified: Secondary | ICD-10-CM | POA: Diagnosis not present

## 2019-01-28 DIAGNOSIS — I96 Gangrene, not elsewhere classified: Secondary | ICD-10-CM

## 2019-01-28 DIAGNOSIS — I779 Disorder of arteries and arterioles, unspecified: Secondary | ICD-10-CM

## 2019-01-28 DIAGNOSIS — R410 Disorientation, unspecified: Secondary | ICD-10-CM | POA: Diagnosis not present

## 2019-01-28 DIAGNOSIS — F05 Delirium due to known physiological condition: Secondary | ICD-10-CM | POA: Diagnosis not present

## 2019-01-28 DIAGNOSIS — M87078 Idiopathic aseptic necrosis of left toe(s): Secondary | ICD-10-CM | POA: Diagnosis not present

## 2019-01-28 DIAGNOSIS — I998 Other disorder of circulatory system: Secondary | ICD-10-CM

## 2019-01-28 NOTE — Progress Notes (Signed)
POST OPERATIVE OFFICE NOTE    CC:  F/u for surgery  HPI: Jenna Marquez is a 73 y.o. female,  Admitted with right hip fracture.  Noted during this admission to have dark colored toes left foot this admission.  She is confused and can't provide much history.  Pt nurse says it only hurts when they touch it.  Former smokere quit 10 years ago.   Other medical problems include paroxysmal afib, COPD CkD 2.  Critical left lower extremity ischemia with toe ulcerations and underwent aortogram with intervention:  Procedure Performed: 1.  Ultrasound-guided cannulation right common femoral artery 2.  Aortogram bilateral lower extremity runoff 3.  CSI atherectomy left SFA with 2.0 classic 4.  Drug-coated balloon angioplasty left SFA with 5 x 250 mm in.pact 5.  CSI atherectomy left posterior tibial artery with 1.25 micro 6.  Balloon angioplasty left posterior tibial artery with 2.5 mm balloon 7.  Moderate sedation with fentanyl and Versed for 139 minutes 8.  Minx device closure right common femoral artery  Findings: The aorta was heavily calcified.  The right side which was not the site of interest there is SFA moderate grade disease without flow-limiting stenosis.  Runoff is via the posterior tibial artery to the ankle and foot.  The left side which is the area of interest has heavy calcification proximal SFA which is nearly occluded.  There are multiple approximately 60% stenoses throughout the rest of the SFA.  Anterior tibial and peroneal arteries run to the level of the ankle and give out.  There is not appear to be any flow into the foot.  The posterior tibial artery is patent to the mid calf and then appears to reconstitute distal foot.  After intervention there is no further stenoses in the SFA.  Unfortunately after intervention the posterior tibial artery we still do not have outflow into the foot.  Patient will remain high risk for left lower extremity amputation.  Dr Donzetta Matters discussed that he  recommended toe amputations verses transtibial amputation of the left LE.  The patien did not want to consider this option.  She returns today for further discussion.  She is very tearful and in obvious pain.  She is unable to straighten her knee and holds her leg bent back to protect it.  She denise fever and chills.  She has blistering and black eschar over the left forefoot.  Allergies  Allergen Reactions   Avelox [Moxifloxacin Hcl In Nacl] Itching and Rash    Current Outpatient Medications  Medication Sig Dispense Refill   acetaminophen (TYLENOL) 325 MG tablet Take 1-2 tablets (325-650 mg total) by mouth every 6 (six) hours as needed for mild pain (pain score 1-3 or temp > 100.5). (Patient taking differently: Take 650 mg by mouth every 6 (six) hours as needed for mild pain (pain score 1-3 or temp > 100.5). ) 10 tablet 0   acyclovir ointment (ZOVIRAX) 5 % Apply 1 application topically 3 (three) times daily. 30 g 0   albuterol (PROAIR HFA) 108 (90 Base) MCG/ACT inhaler Inhale 1-2 puffs into the lungs every 6 (six) hours as needed for wheezing or shortness of breath. 1 Inhaler 3   albuterol (PROVENTIL) (2.5 MG/3ML) 0.083% nebulizer solution Take 3 mLs (2.5 mg total) by nebulization every 6 (six) hours as needed for wheezing or shortness of breath. 75 mL 12   ALPRAZolam (XANAX) 0.5 MG tablet TAKE 1 TAB EVERY 8 HRS AS NEEDED FOR ANXIETY, DO NOT TAKE AT NIGHT WITH  HYDROXYZINE OR WITH ALCOHOL 10 tablet 0   alum & mag hydroxide-simeth (MAALOX/MYLANTA) 200-200-20 MG/5ML suspension Take 15 mLs by mouth every 6 (six) hours as needed for indigestion or heartburn. 355 mL 0   atorvastatin (LIPITOR) 40 MG tablet Take 1 tablet (40 mg total) by mouth daily. 30 tablet 11   bisacodyl (DULCOLAX) 10 MG suppository Place 1 suppository (10 mg total) rectally daily as needed for moderate constipation. 12 suppository 0   budesonide-formoterol (SYMBICORT) 160-4.5 MCG/ACT inhaler Inhale 2 puffs into the lungs 2  (two) times daily. 3 Inhaler 1   calcium-vitamin D (OSCAL WITH D) 250-125 MG-UNIT tablet Take 1 tablet by mouth daily.     Cholecalciferol (VITAMIN D) 2000 UNITS CAPS Take 1 capsule by mouth daily.     clopidogrel (PLAVIX) 75 MG tablet Take 1 tablet (75 mg total) by mouth daily. 30 tablet 11   docusate sodium (COLACE) 100 MG capsule Take 1 capsule (100 mg total) by mouth 2 (two) times daily. 10 capsule 0   enoxaparin (LOVENOX) 40 MG/0.4ML injection Inject 0.4 mLs (40 mg total) into the skin daily for 30 doses. For 30 days post op for DVT prophylaxis 30 Syringe 0   furosemide (LASIX) 40 MG tablet Take 1 tablet (40 mg total) by mouth daily. 30 tablet 0   guaiFENesin-dextromethorphan (ROBITUSSIN DM) 100-10 MG/5ML syrup Take 5 mLs by mouth every 4 (four) hours as needed for cough (chest congestion). 118 mL 0   HYDROcodone-acetaminophen (NORCO/VICODIN) 5-325 MG tablet Take 2 tablets by mouth every 6 (six) hours as needed for severe pain. For seven days Order ends 6.4.20     ipratropium (ATROVENT) 0.02 % nebulizer solution INHALE 2.5ML BY NEBULIZATION EVERY 6 HOURS AS NEEDED FOR WHEEZING OR SHORTNESS OF BREATH. (Patient taking differently: Take 0.5 mg by nebulization every 6 (six) hours as needed for wheezing or shortness of breath. ) 62.5 mL 2   methotrexate (RHEUMATREX) 2.5 MG tablet Take 12.5 mg by mouth every Monday.   0   metoprolol tartrate (LOPRESSOR) 50 MG tablet Take 1 tablet (50 mg total) by mouth 2 (two) times daily. 180 tablet 1   ondansetron (ZOFRAN) 4 MG tablet Take 1 tablet (4 mg total) by mouth every 6 (six) hours as needed for nausea. 20 tablet 0   polyethylene glycol (MIRALAX / GLYCOLAX) 17 g packet Take 17 g by mouth daily as needed for mild constipation. 14 each 0   predniSONE (DELTASONE) 10 MG tablet 4 tabs by mouth once daily for 2 days, then 3 tabs daily x 2 days, then 2 tabs daily x 2 days, then 1 tab daily x 2 days 20 tablet 0   thiamine 100 MG tablet Take 1 tablet  (100 mg total) by mouth daily.     traMADol (ULTRAM) 50 MG tablet Do not take with xanax or alcohol. RESUME AFTER HYDROCODONE-ACETAMINOPHEN is finished 10 tablet 0   No current facility-administered medications for this visit.      ROS    Physical Exam:  Vitals:   01/28/19 1402  BP: 116/68  Pulse: 70  Resp: 14  Temp: 97.8 F (36.6 C)  SpO2: 97%     Incision: right groin soft Extremities:  Ischmic left foot with blistering and dry gangrene toes, right doppler signal PT  Abdomen:  Soft with + BS Cardiac: regular rhythm, no detected murmur. Pacemaker palpated left upper chest.     Lungs non labored breathing  Assessment/Plan:  This is a 73 y.o. female who has  an ischemic left foot s/p: Procedure Performed: 1.  Ultrasound-guided cannulation right common femoral artery 2.  Aortogram bilateral lower extremity runoff 3.  CSI atherectomy left SFA with 2.0 classic 4.  Drug-coated balloon angioplasty left SFA with 5 x 250 mm in.pact 5.  CSI atherectomy left posterior tibial artery with 1.25 micro 6.  Balloon angioplasty left posterior tibial artery with 2.5 mm balloon 7.  Moderate sedation with fentanyl and Versed for 139 minutes 8.  Minx device closure right common femoral artery  Anterior tibial and peroneal arteries run to the level of the ankle and give out.  There is not appear to be any flow into the foot per the findings on the angiogram 01/14/2019.    She is in uncontrolled pain and will be scheduled for TMA verse BKA 01/29/2019 with Dr. Donzetta Matters.  I talked with her daughter in law Jenna Marquez who states her mother in law wishes to proceed with surgery tomorrow.      [name]  PA-C [facility] [phone]  Clinic MD:  Trula Slade

## 2019-01-28 NOTE — Pre-Procedure Instructions (Signed)
   Jenna Marquez  01/28/2019     [id number] [address], [address] [place] [address] Phone: [phone] Fax: [phone]   Your procedure is scheduled on Tuesday, January 29, 2019  Report to [facility] Admitting at 9:30 A.M. (per MD)  Call this number if you have problems the morning of surgery:  [phone]   Remember: Brush your teeth with your regular toothpaste the morning of surgery.  Do not eat or drink after midnight.  Take these medicines the morning of surgery with A SIP OF WATER : metoprolol tartrate (LOPRESSOR), predniSONE (DELTASONE),  SYMBICORT)  inhaler If needed: pain medication ( Tylenol or Tramadol),   If needed: ALPRAZolam Duanne Moron) for anxiety If needed: ondansetron [facility] )for nausea, If needed: nebulizer treatment for wheezing or shortness of breath If needed: albuterol (PROAIR HFA)  Inhaler for wheezing of shortness of breath ( bring inhaler in with you on day of surgery)  Stop taking vitamins, fish oil and herbal medications. Do not take any NSAIDs ie: Ibuprofen, Advil, Naproxen (Aleve), Motrin, BC and Goody Powder; stop now.  Follow the surgeon's instructions regarding clopidogrel (PLAVIX); if no instructions were provided please contact surgeon.   Do not wear jewelry, make-up or nail polish.  Do not wear lotions, powders, or perfumes, or deodorant.  Do not shave 48 hours prior to surgery.    Do not bring valuables to the hospital.  [facility] is not responsible for any belongings or valuables.  Contacts, dentures or bridgework may not be worn into surgery. For patients admitted to the hospital, discharge time will be determined by your treatment team. Patients discharged the day of surgery will not be allowed to drive home Please read over the following fact sheets that you were given.

## 2019-01-28 NOTE — Progress Notes (Signed)
SDW-pre-op call completed by pt nurse, Donna Christen, LPN at [facility]. Nurse confirmed receipt of fax and verbalized understanding of all pre-op instructions. Nurse denies that pt tested positive fore COVID -19 or was knowingly exposed to someone with COVID-19. Nurse denies that resident has symptoms of COVID-19. Peri-op prescription for ICD initiated; awaiting response. Nurse sent email  to [employer] reps to make aware of pending surgery.

## 2019-01-29 ENCOUNTER — Inpatient Hospital Stay (HOSPITAL_COMMUNITY): Payer: PPO | Admitting: Anesthesiology

## 2019-01-29 ENCOUNTER — Encounter (HOSPITAL_COMMUNITY)
Admission: RE | Disposition: A | Discharge status: Skilled Nursing Facility | Payer: Self-pay | Source: Home / Self Care | Attending: Vascular Surgery

## 2019-01-29 ENCOUNTER — Inpatient Hospital Stay (HOSPITAL_COMMUNITY)
Admission: RE | Admit: 2019-01-29 | Discharge: 2019-02-01 | DRG: 240 | Disposition: A | Discharge status: Skilled Nursing Facility | Payer: PPO | Attending: Vascular Surgery | Admitting: Vascular Surgery

## 2019-01-29 DIAGNOSIS — I739 Peripheral vascular disease, unspecified: Secondary | ICD-10-CM | POA: Diagnosis not present

## 2019-01-29 DIAGNOSIS — E876 Hypokalemia: Secondary | ICD-10-CM | POA: Diagnosis not present

## 2019-01-29 DIAGNOSIS — J9611 Chronic respiratory failure with hypoxia: Secondary | ICD-10-CM | POA: Diagnosis not present

## 2019-01-29 DIAGNOSIS — Z950 Presence of cardiac pacemaker: Secondary | ICD-10-CM

## 2019-01-29 DIAGNOSIS — I700 Atherosclerosis of aorta: Secondary | ICD-10-CM | POA: Diagnosis present

## 2019-01-29 DIAGNOSIS — F411 Generalized anxiety disorder: Secondary | ICD-10-CM | POA: Diagnosis present

## 2019-01-29 DIAGNOSIS — Z87891 Personal history of nicotine dependence: Secondary | ICD-10-CM

## 2019-01-29 DIAGNOSIS — N182 Chronic kidney disease, stage 2 (mild): Secondary | ICD-10-CM | POA: Diagnosis not present

## 2019-01-29 DIAGNOSIS — I480 Paroxysmal atrial fibrillation: Secondary | ICD-10-CM | POA: Diagnosis present

## 2019-01-29 DIAGNOSIS — Z1159 Encounter for screening for other viral diseases: Secondary | ICD-10-CM | POA: Diagnosis not present

## 2019-01-29 DIAGNOSIS — Z4789 Encounter for other orthopedic aftercare: Secondary | ICD-10-CM | POA: Diagnosis not present

## 2019-01-29 DIAGNOSIS — N178 Other acute kidney failure: Secondary | ICD-10-CM | POA: Diagnosis not present

## 2019-01-29 DIAGNOSIS — M6281 Muscle weakness (generalized): Secondary | ICD-10-CM | POA: Diagnosis not present

## 2019-01-29 DIAGNOSIS — R41841 Cognitive communication deficit: Secondary | ICD-10-CM | POA: Diagnosis not present

## 2019-01-29 DIAGNOSIS — R45851 Suicidal ideations: Secondary | ICD-10-CM | POA: Diagnosis not present

## 2019-01-29 DIAGNOSIS — R410 Disorientation, unspecified: Secondary | ICD-10-CM | POA: Diagnosis not present

## 2019-01-29 DIAGNOSIS — J449 Chronic obstructive pulmonary disease, unspecified: Secondary | ICD-10-CM | POA: Diagnosis present

## 2019-01-29 DIAGNOSIS — K219 Gastro-esophageal reflux disease without esophagitis: Secondary | ICD-10-CM | POA: Diagnosis not present

## 2019-01-29 DIAGNOSIS — I129 Hypertensive chronic kidney disease with stage 1 through stage 4 chronic kidney disease, or unspecified chronic kidney disease: Secondary | ICD-10-CM | POA: Diagnosis not present

## 2019-01-29 DIAGNOSIS — I96 Gangrene, not elsewhere classified: Secondary | ICD-10-CM | POA: Diagnosis not present

## 2019-01-29 DIAGNOSIS — D500 Iron deficiency anemia secondary to blood loss (chronic): Secondary | ICD-10-CM | POA: Diagnosis not present

## 2019-01-29 DIAGNOSIS — I998 Other disorder of circulatory system: Secondary | ICD-10-CM | POA: Diagnosis not present

## 2019-01-29 DIAGNOSIS — I70262 Atherosclerosis of native arteries of extremities with gangrene, left leg: Secondary | ICD-10-CM | POA: Diagnosis not present

## 2019-01-29 DIAGNOSIS — E559 Vitamin D deficiency, unspecified: Secondary | ICD-10-CM | POA: Diagnosis not present

## 2019-01-29 DIAGNOSIS — L97529 Non-pressure chronic ulcer of other part of left foot with unspecified severity: Secondary | ICD-10-CM | POA: Diagnosis present

## 2019-01-29 DIAGNOSIS — Z9981 Dependence on supplemental oxygen: Secondary | ICD-10-CM | POA: Diagnosis not present

## 2019-01-29 DIAGNOSIS — I441 Atrioventricular block, second degree: Secondary | ICD-10-CM | POA: Diagnosis present

## 2019-01-29 DIAGNOSIS — M2550 Pain in unspecified joint: Secondary | ICD-10-CM | POA: Diagnosis not present

## 2019-01-29 DIAGNOSIS — F4321 Adjustment disorder with depressed mood: Secondary | ICD-10-CM | POA: Diagnosis not present

## 2019-01-29 DIAGNOSIS — Z4781 Encounter for orthopedic aftercare following surgical amputation: Secondary | ICD-10-CM | POA: Diagnosis not present

## 2019-01-29 DIAGNOSIS — N183 Chronic kidney disease, stage 3 (moderate): Secondary | ICD-10-CM | POA: Diagnosis not present

## 2019-01-29 DIAGNOSIS — R230 Cyanosis: Secondary | ICD-10-CM | POA: Diagnosis not present

## 2019-01-29 DIAGNOSIS — J810 Acute pulmonary edema: Secondary | ICD-10-CM | POA: Diagnosis not present

## 2019-01-29 DIAGNOSIS — Z89512 Acquired absence of left leg below knee: Secondary | ICD-10-CM | POA: Diagnosis not present

## 2019-01-29 DIAGNOSIS — Z7401 Bed confinement status: Secondary | ICD-10-CM | POA: Diagnosis not present

## 2019-01-29 DIAGNOSIS — F1010 Alcohol abuse, uncomplicated: Secondary | ICD-10-CM | POA: Diagnosis not present

## 2019-01-29 DIAGNOSIS — S72141D Displaced intertrochanteric fracture of right femur, subsequent encounter for closed fracture with routine healing: Secondary | ICD-10-CM | POA: Diagnosis not present

## 2019-01-29 DIAGNOSIS — E7849 Other hyperlipidemia: Secondary | ICD-10-CM | POA: Diagnosis not present

## 2019-01-29 HISTORY — PX: AMPUTATION: SHX166

## 2019-01-29 HISTORY — DX: Gangrene, not elsewhere classified: I96

## 2019-01-29 HISTORY — DX: Unspecified atrioventricular block: I44.30

## 2019-01-29 LAB — SURGICAL PCR SCREEN
MRSA, PCR: POSITIVE — AB
Staphylococcus aureus: POSITIVE — AB

## 2019-01-29 LAB — CBC
HCT: 28 % — ABNORMAL LOW (ref 36.0–46.0)
Hemoglobin: 8.7 g/dL — ABNORMAL LOW (ref 12.0–15.0)
MCH: 30 pg (ref 26.0–34.0)
MCHC: 31.1 g/dL (ref 30.0–36.0)
MCV: 96.6 fL (ref 80.0–100.0)
Platelets: 530 10*3/uL — ABNORMAL HIGH (ref 150–400)
RBC: 2.9 MIL/uL — ABNORMAL LOW (ref 3.87–5.11)
RDW: 17 % — ABNORMAL HIGH (ref 11.5–15.5)
WBC: 15.2 10*3/uL — ABNORMAL HIGH (ref 4.0–10.5)
nRBC: 0 % (ref 0.0–0.2)

## 2019-01-29 LAB — BASIC METABOLIC PANEL WITH GFR
Anion gap: 14 (ref 5–15)
BUN: 18 mg/dL (ref 8–23)
CO2: 27 mmol/L (ref 22–32)
Calcium: 8.9 mg/dL (ref 8.9–10.3)
Chloride: 97 mmol/L — ABNORMAL LOW (ref 98–111)
Creatinine, Ser: 2.3 mg/dL — ABNORMAL HIGH (ref 0.44–1.00)
GFR calc Af Amer: 23 mL/min — ABNORMAL LOW
GFR calc non Af Amer: 20 mL/min — ABNORMAL LOW
Glucose, Bld: 80 mg/dL (ref 70–99)
Potassium: 3.5 mmol/L (ref 3.5–5.1)
Sodium: 138 mmol/L (ref 135–145)

## 2019-01-29 LAB — SARS CORONAVIRUS 2: SARS Coronavirus 2: NOT DETECTED

## 2019-01-29 SURGERY — AMPUTATION BELOW KNEE
Anesthesia: General | Site: Leg Lower | Laterality: Left

## 2019-01-29 MED ORDER — HEPARIN SODIUM (PORCINE) 5000 UNIT/ML IJ SOLN
5000.0000 [IU] | Freq: Three times a day (TID) | INTRAMUSCULAR | Status: DC
  Administered 2019-01-30 – 2019-02-01 (×6): 5000 [IU] via SUBCUTANEOUS
  Filled 2019-01-29 (×6): qty 1

## 2019-01-29 MED ORDER — FENTANYL CITRATE (PF) 100 MCG/2ML IJ SOLN
INTRAMUSCULAR | Status: DC | PRN
  Administered 2019-01-29: 50 ug via INTRAVENOUS

## 2019-01-29 MED ORDER — HYDROCODONE-ACETAMINOPHEN 5-325 MG PO TABS
2.0000 | ORAL_TABLET | Freq: Four times a day (QID) | ORAL | Status: DC | PRN
  Administered 2019-01-30 – 2019-02-01 (×7): 2 via ORAL
  Filled 2019-01-29 (×7): qty 2

## 2019-01-29 MED ORDER — CEFAZOLIN SODIUM 1 G IJ SOLR
INTRAMUSCULAR | Status: AC
  Filled 2019-01-29: qty 20

## 2019-01-29 MED ORDER — MORPHINE SULFATE (PF) 2 MG/ML IV SOLN
2.0000 mg | INTRAVENOUS | Status: DC | PRN
  Administered 2019-01-29 – 2019-01-30 (×3): 2 mg via INTRAVENOUS
  Filled 2019-01-29 (×3): qty 1

## 2019-01-29 MED ORDER — MUPIROCIN 2 % EX OINT
1.0000 "application " | TOPICAL_OINTMENT | Freq: Two times a day (BID) | CUTANEOUS | Status: DC
  Administered 2019-01-29 – 2019-01-31 (×5): 1 via NASAL
  Filled 2019-01-29 (×2): qty 22

## 2019-01-29 MED ORDER — FENTANYL CITRATE (PF) 250 MCG/5ML IJ SOLN
INTRAMUSCULAR | Status: AC
  Filled 2019-01-29: qty 5

## 2019-01-29 MED ORDER — PANTOPRAZOLE SODIUM 40 MG PO TBEC
40.0000 mg | DELAYED_RELEASE_TABLET | Freq: Every day | ORAL | Status: DC
  Administered 2019-01-30 – 2019-02-01 (×3): 40 mg via ORAL
  Filled 2019-01-29 (×3): qty 1

## 2019-01-29 MED ORDER — VITAMIN D 25 MCG (1000 UNIT) PO TABS
1000.0000 [IU] | ORAL_TABLET | Freq: Every day | ORAL | Status: DC
  Administered 2019-01-30 – 2019-02-01 (×3): 1000 [IU] via ORAL
  Filled 2019-01-29 (×3): qty 1

## 2019-01-29 MED ORDER — SUCCINYLCHOLINE CHLORIDE 20 MG/ML IJ SOLN
INTRAMUSCULAR | Status: DC | PRN
  Administered 2019-01-29: 100 mg via INTRAVENOUS

## 2019-01-29 MED ORDER — METOPROLOL TARTRATE 50 MG PO TABS
50.0000 mg | ORAL_TABLET | Freq: Two times a day (BID) | ORAL | Status: DC
  Administered 2019-01-29 – 2019-02-01 (×5): 50 mg via ORAL
  Filled 2019-01-29 (×6): qty 1

## 2019-01-29 MED ORDER — GUAIFENESIN-DM 100-10 MG/5ML PO SYRP: 5.0000 mL | ORAL_SOLUTION | ORAL | Status: DC | PRN

## 2019-01-29 MED ORDER — SUCCINYLCHOLINE CHLORIDE 200 MG/10ML IV SOSY
PREFILLED_SYRINGE | INTRAVENOUS | Status: AC
  Filled 2019-01-29: qty 10

## 2019-01-29 MED ORDER — MUPIROCIN 2 % EX OINT
TOPICAL_OINTMENT | CUTANEOUS | Status: AC
  Filled 2019-01-29: qty 22

## 2019-01-29 MED ORDER — PROPOFOL 10 MG/ML IV BOLUS
INTRAVENOUS | Status: AC
  Filled 2019-01-29: qty 20

## 2019-01-29 MED ORDER — VITAMIN B-1 100 MG PO TABS
100.0000 mg | ORAL_TABLET | Freq: Every day | ORAL | Status: DC
  Administered 2019-01-30 – 2019-02-01 (×3): 100 mg via ORAL
  Filled 2019-01-29 (×3): qty 1

## 2019-01-29 MED ORDER — ALBUTEROL SULFATE HFA 108 (90 BASE) MCG/ACT IN AERS: 1.0000 | INHALATION_SPRAY | Freq: Four times a day (QID) | RESPIRATORY_TRACT | Status: DC | PRN

## 2019-01-29 MED ORDER — METOPROLOL TARTRATE 5 MG/5ML IV SOLN: 2.0000 mg | INTRAVENOUS | Status: DC | PRN

## 2019-01-29 MED ORDER — DEXAMETHASONE SODIUM PHOSPHATE 4 MG/ML IJ SOLN: INTRAMUSCULAR | Status: DC | PRN

## 2019-01-29 MED ORDER — HYDRALAZINE HCL 20 MG/ML IJ SOLN: 5.0000 mg | INTRAMUSCULAR | Status: DC | PRN

## 2019-01-29 MED ORDER — MIDAZOLAM HCL 2 MG/2ML IJ SOLN
INTRAMUSCULAR | Status: AC
  Filled 2019-01-29: qty 2

## 2019-01-29 MED ORDER — OXYCODONE HCL 5 MG PO TABS
10.0000 mg | ORAL_TABLET | Freq: Four times a day (QID) | ORAL | Status: DC | PRN
  Administered 2019-01-29 – 2019-01-31 (×4): 10 mg via ORAL
  Filled 2019-01-29 (×4): qty 2

## 2019-01-29 MED ORDER — HYDROMORPHONE HCL 1 MG/ML IJ SOLN: 0.2500 mg | INTRAMUSCULAR | Status: DC | PRN

## 2019-01-29 MED ORDER — LABETALOL HCL 5 MG/ML IV SOLN: 10.0000 mg | INTRAVENOUS | Status: DC | PRN

## 2019-01-29 MED ORDER — SODIUM CHLORIDE 0.9 % IV SOLN
INTRAVENOUS | Status: DC
  Administered 2019-01-29: 21:00:00 via INTRAVENOUS

## 2019-01-29 MED ORDER — DEXAMETHASONE SODIUM PHOSPHATE 10 MG/ML IJ SOLN
INTRAMUSCULAR | Status: AC
  Filled 2019-01-29: qty 1

## 2019-01-29 MED ORDER — POTASSIUM CHLORIDE CRYS ER 20 MEQ PO TBCR: 20.0000 meq | EXTENDED_RELEASE_TABLET | Freq: Every day | ORAL | Status: DC | PRN

## 2019-01-29 MED ORDER — CHLORHEXIDINE GLUCONATE CLOTH 2 % EX PADS
6.0000 | MEDICATED_PAD | Freq: Every day | CUTANEOUS | Status: DC
  Administered 2019-01-30 – 2019-02-01 (×3): 6 via TOPICAL

## 2019-01-29 MED ORDER — ALUM & MAG HYDROXIDE-SIMETH 200-200-20 MG/5ML PO SUSP: 15.0000 mL | Freq: Four times a day (QID) | ORAL | Status: DC | PRN

## 2019-01-29 MED ORDER — DOCUSATE SODIUM 100 MG PO CAPS
100.0000 mg | ORAL_CAPSULE | Freq: Two times a day (BID) | ORAL | Status: DC
  Administered 2019-01-29 – 2019-02-01 (×5): 100 mg via ORAL
  Filled 2019-01-29 (×6): qty 1

## 2019-01-29 MED ORDER — DEXMEDETOMIDINE HCL IN NACL 400 MCG/100ML IV SOLN
INTRAVENOUS | Status: DC | PRN
  Administered 2019-01-29: 8 ug via INTRAVENOUS

## 2019-01-29 MED ORDER — DEXAMETHASONE SODIUM PHOSPHATE 10 MG/ML IJ SOLN
INTRAMUSCULAR | Status: DC | PRN
  Administered 2019-01-29: 5 mg via INTRAVENOUS

## 2019-01-29 MED ORDER — PHENYLEPHRINE 40 MCG/ML (10ML) SYRINGE FOR IV PUSH (FOR BLOOD PRESSURE SUPPORT)
PREFILLED_SYRINGE | INTRAVENOUS | Status: DC | PRN
  Administered 2019-01-29: 120 ug via INTRAVENOUS
  Administered 2019-01-29: 200 ug via INTRAVENOUS
  Administered 2019-01-29: 80 ug via INTRAVENOUS

## 2019-01-29 MED ORDER — PROPOFOL 10 MG/ML IV BOLUS
INTRAVENOUS | Status: DC | PRN
  Administered 2019-01-29: 100 mg via INTRAVENOUS

## 2019-01-29 MED ORDER — ACYCLOVIR 5 % EX OINT
1.0000 "application " | TOPICAL_OINTMENT | Freq: Three times a day (TID) | CUTANEOUS | Status: DC
  Administered 2019-01-29 – 2019-01-31 (×3): 1 via TOPICAL
  Filled 2019-01-29: qty 15

## 2019-01-29 MED ORDER — DEXMEDETOMIDINE HCL IN NACL 200 MCG/50ML IV SOLN
INTRAVENOUS | Status: AC
  Filled 2019-01-29: qty 50

## 2019-01-29 MED ORDER — PROPOFOL 1000 MG/100ML IV EMUL
INTRAVENOUS | Status: AC
  Filled 2019-01-29: qty 100

## 2019-01-29 MED ORDER — PHENOL 1.4 % MT LIQD: 1.0000 | OROMUCOSAL | Status: DC | PRN

## 2019-01-29 MED ORDER — ONDANSETRON HCL 4 MG/2ML IJ SOLN: 4.0000 mg | Freq: Four times a day (QID) | INTRAMUSCULAR | Status: DC | PRN

## 2019-01-29 MED ORDER — LACTATED RINGERS IV SOLN
INTRAVENOUS | Status: DC | PRN
  Administered 2019-01-29: 13:00:00 via INTRAVENOUS

## 2019-01-29 MED ORDER — MUPIROCIN 2 % EX OINT: 1.0000 "application " | TOPICAL_OINTMENT | Freq: Once | CUTANEOUS | Status: DC

## 2019-01-29 MED ORDER — ALBUTEROL SULFATE (2.5 MG/3ML) 0.083% IN NEBU: 2.5000 mg | INHALATION_SOLUTION | Freq: Four times a day (QID) | RESPIRATORY_TRACT | Status: DC | PRN

## 2019-01-29 MED ORDER — MIDAZOLAM HCL 2 MG/2ML IJ SOLN: 0.5000 mg | Freq: Once | INTRAMUSCULAR | Status: DC | PRN

## 2019-01-29 MED ORDER — PROMETHAZINE HCL 25 MG/ML IJ SOLN: 6.2500 mg | INTRAMUSCULAR | Status: DC | PRN

## 2019-01-29 MED ORDER — CEFAZOLIN SODIUM-DEXTROSE 2-4 GM/100ML-% IV SOLN
2.0000 g | Freq: Once | INTRAVENOUS | Status: AC
  Administered 2019-01-30: 2 g via INTRAVENOUS
  Filled 2019-01-29: qty 100

## 2019-01-29 MED ORDER — BISACODYL 10 MG RE SUPP: 10.0000 mg | Freq: Every day | RECTAL | Status: DC | PRN

## 2019-01-29 MED ORDER — FUROSEMIDE 40 MG PO TABS
40.0000 mg | ORAL_TABLET | Freq: Every day | ORAL | Status: DC
  Administered 2019-01-29 – 2019-02-01 (×4): 40 mg via ORAL
  Filled 2019-01-29 (×4): qty 1

## 2019-01-29 MED ORDER — ONDANSETRON HCL 4 MG/2ML IJ SOLN
INTRAMUSCULAR | Status: DC | PRN
  Administered 2019-01-29: 4 mg via INTRAVENOUS

## 2019-01-29 MED ORDER — ONDANSETRON HCL 4 MG/2ML IJ SOLN
INTRAMUSCULAR | Status: AC
  Filled 2019-01-29: qty 2

## 2019-01-29 MED ORDER — MOMETASONE FURO-FORMOTEROL FUM 200-5 MCG/ACT IN AERO
2.0000 | INHALATION_SPRAY | Freq: Two times a day (BID) | RESPIRATORY_TRACT | Status: DC
  Administered 2019-01-29 – 2019-02-01 (×5): 2 via RESPIRATORY_TRACT
  Filled 2019-01-29: qty 8.8

## 2019-01-29 MED ORDER — ONDANSETRON HCL 4 MG PO TABS: 4.0000 mg | ORAL_TABLET | Freq: Four times a day (QID) | ORAL | Status: DC | PRN

## 2019-01-29 MED ORDER — MAGNESIUM SULFATE 2 GM/50ML IV SOLN: 2.0000 g | Freq: Every day | INTRAVENOUS | Status: DC | PRN

## 2019-01-29 MED ORDER — PHENYLEPHRINE 40 MCG/ML (10ML) SYRINGE FOR IV PUSH (FOR BLOOD PRESSURE SUPPORT)
PREFILLED_SYRINGE | INTRAVENOUS | Status: AC
  Filled 2019-01-29: qty 10

## 2019-01-29 MED ORDER — CEFAZOLIN SODIUM-DEXTROSE 2-3 GM-%(50ML) IV SOLR
INTRAVENOUS | Status: DC | PRN
  Administered 2019-01-29: 2 g via INTRAVENOUS

## 2019-01-29 MED ORDER — MEPERIDINE HCL 25 MG/ML IJ SOLN: 6.2500 mg | INTRAMUSCULAR | Status: DC | PRN

## 2019-01-29 MED ORDER — ALPRAZOLAM 0.5 MG PO TABS
0.5000 mg | ORAL_TABLET | Freq: Three times a day (TID) | ORAL | Status: DC | PRN
  Administered 2019-01-29 – 2019-02-01 (×4): 0.5 mg via ORAL
  Filled 2019-01-29 (×6): qty 1

## 2019-01-29 MED ORDER — ACETAMINOPHEN 325 MG PO TABS: 325.0000 mg | ORAL_TABLET | Freq: Four times a day (QID) | ORAL | Status: DC | PRN

## 2019-01-29 MED ORDER — CLOPIDOGREL BISULFATE 75 MG PO TABS
75.0000 mg | ORAL_TABLET | Freq: Every day | ORAL | Status: DC
  Administered 2019-01-29 – 2019-02-01 (×4): 75 mg via ORAL
  Filled 2019-01-29 (×4): qty 1

## 2019-01-29 MED ORDER — POLYETHYLENE GLYCOL 3350 17 G PO PACK: 17.0000 g | PACK | Freq: Every day | ORAL | Status: DC | PRN

## 2019-01-29 MED ORDER — LIDOCAINE 2% (20 MG/ML) 5 ML SYRINGE
INTRAMUSCULAR | Status: DC | PRN
  Administered 2019-01-29: 30 mg via INTRAVENOUS

## 2019-01-29 MED ORDER — IPRATROPIUM BROMIDE 0.02 % IN SOLN: 0.5000 mg | Freq: Four times a day (QID) | RESPIRATORY_TRACT | Status: DC | PRN

## 2019-01-29 MED ORDER — ATORVASTATIN CALCIUM 40 MG PO TABS
40.0000 mg | ORAL_TABLET | Freq: Every day | ORAL | Status: DC
  Administered 2019-01-29 – 2019-02-01 (×3): 40 mg via ORAL
  Filled 2019-01-29 (×4): qty 1

## 2019-01-29 SURGICAL SUPPLY — 52 items
BANDAGE ACE 4X5 VEL STRL LF (GAUZE/BANDAGES/DRESSINGS) IMPLANT
BANDAGE ACE 6X5 VEL STRL LF (GAUZE/BANDAGES/DRESSINGS) IMPLANT
BANDAGE ELASTIC 6 VELCRO ST LF (GAUZE/BANDAGES/DRESSINGS) ×2 IMPLANT
BANDAGE ESMARK 6X9 LF (GAUZE/BANDAGES/DRESSINGS) IMPLANT
BLADE LONG MED 31MMX9MM (MISCELLANEOUS)
BLADE LONG MED 31X9 (MISCELLANEOUS) IMPLANT
BLADE SAW GIGLI 510 (BLADE) ×2 IMPLANT
BLADE SAW GIGLI 510MM (BLADE) ×1
BNDG CMPR 9X6 STRL LF SNTH (GAUZE/BANDAGES/DRESSINGS)
BNDG COHESIVE 6X5 TAN STRL LF (GAUZE/BANDAGES/DRESSINGS) ×3 IMPLANT
BNDG ESMARK 6X9 LF (GAUZE/BANDAGES/DRESSINGS)
BNDG GAUZE ELAST 4 BULKY (GAUZE/BANDAGES/DRESSINGS) ×2 IMPLANT
CANISTER SUCT 3000ML PPV (MISCELLANEOUS) ×3 IMPLANT
CLIP VESOCCLUDE MED 6/CT (CLIP) IMPLANT
COVER SURGICAL LIGHT HANDLE (MISCELLANEOUS) ×3 IMPLANT
COVER WAND RF STERILE (DRAPES) ×3 IMPLANT
CUFF TOURNIQUET SINGLE 34IN LL (TOURNIQUET CUFF) IMPLANT
CUFF TOURNIQUET SINGLE 44IN (TOURNIQUET CUFF) IMPLANT
DRAIN CHANNEL 19F RND (DRAIN) IMPLANT
DRAPE HALF SHEET 40X57 (DRAPES) ×3 IMPLANT
DRAPE ORTHO SPLIT 77X108 STRL (DRAPES) ×6
DRAPE SURG ORHT 6 SPLT 77X108 (DRAPES) ×2 IMPLANT
DRSG ADAPTIC 3X8 NADH LF (GAUZE/BANDAGES/DRESSINGS) ×3 IMPLANT
ELECT REM PT RETURN 9FT ADLT (ELECTROSURGICAL) ×3
ELECTRODE REM PT RTRN 9FT ADLT (ELECTROSURGICAL) ×1 IMPLANT
EVACUATOR SILICONE 100CC (DRAIN) IMPLANT
GAUZE SPONGE 4X4 12PLY STRL (GAUZE/BANDAGES/DRESSINGS) ×3 IMPLANT
GAUZE SPONGE 4X4 12PLY STRL LF (GAUZE/BANDAGES/DRESSINGS) ×2 IMPLANT
GLOVE BIO SURGEON STRL SZ7.5 (GLOVE) ×3 IMPLANT
GOWN STRL REUS W/ TWL LRG LVL3 (GOWN DISPOSABLE) ×2 IMPLANT
GOWN STRL REUS W/ TWL XL LVL3 (GOWN DISPOSABLE) ×1 IMPLANT
GOWN STRL REUS W/TWL LRG LVL3 (GOWN DISPOSABLE) ×6
GOWN STRL REUS W/TWL XL LVL3 (GOWN DISPOSABLE) ×3
KIT BASIN OR (CUSTOM PROCEDURE TRAY) ×3 IMPLANT
KIT TURNOVER KIT B (KITS) ×3 IMPLANT
NS IRRIG 1000ML POUR BTL (IV SOLUTION) ×3 IMPLANT
PACK GENERAL/GYN (CUSTOM PROCEDURE TRAY) ×3 IMPLANT
PAD ARMBOARD 7.5X6 YLW CONV (MISCELLANEOUS) ×6 IMPLANT
STAPLER VISISTAT 35W (STAPLE) ×3 IMPLANT
STOCKINETTE IMPERVIOUS LG (DRAPES) ×3 IMPLANT
SUT BONE WAX W31G (SUTURE) IMPLANT
SUT ETHILON 3 0 PS 1 (SUTURE) IMPLANT
SUT SILK 0 TIES 10X30 (SUTURE) ×3 IMPLANT
SUT SILK 2 0 (SUTURE) ×3
SUT SILK 2 0 SH CR/8 (SUTURE) ×3 IMPLANT
SUT SILK 2-0 18XBRD TIE 12 (SUTURE) ×1 IMPLANT
SUT SILK 3 0 (SUTURE)
SUT SILK 3-0 18XBRD TIE 12 (SUTURE) IMPLANT
SUT VIC AB 2-0 CT1 18 (SUTURE) ×8 IMPLANT
TOWEL GREEN STERILE (TOWEL DISPOSABLE) ×6 IMPLANT
UNDERPAD 30X30 (UNDERPADS AND DIAPERS) ×3 IMPLANT
WATER STERILE IRR 1000ML POUR (IV SOLUTION) ×3 IMPLANT

## 2019-01-29 NOTE — Anesthesia Preprocedure Evaluation (Addendum)
Anesthesia Evaluation  Patient identified by MRN, date of birth, ID band Patient awake    Reviewed: Allergy & Precautions, NPO status , Patient's Chart, lab work & pertinent test results, reviewed documented beta blocker date and time   History of Anesthesia Complications Negative for: history of anesthetic complications  Airway Mallampati: II  TM Distance: >3 FB Neck ROM: Full    Dental  (+) Partial Upper, Partial Lower, Dental Advisory Given   Pulmonary COPD,  COPD inhaler and oxygen dependent, former smoker (quit 2013),  01/29/2019 SARS coronavirus NEG   breath sounds clear to auscultation       Cardiovascular hypertension, Pt. on medications and Pt. on home beta blockers (-) angina+ dysrhythmias Atrial Fibrillation + pacemaker (for AVB)  Rhythm:Regular Rate:Normal  12/2018 EF 60-65%, valves OK   Neuro/Psych Anxiety Depression negative neurological ROS     GI/Hepatic GERD  ,(+)     substance abuse  alcohol use,   Endo/Other    Renal/GU Renal InsufficiencyRenal disease     Musculoskeletal   Abdominal   Peds  Hematology  (+) Blood dyscrasia (Hb 8.7), anemia ,   Anesthesia Other Findings   Reproductive/Obstetrics                            Anesthesia Physical Anesthesia Plan  ASA: III  Anesthesia Plan: General   Post-op Pain Management:    Induction:   PONV Risk Score and Plan: 4 or greater and Dexamethasone, Ondansetron and Treatment may vary due to age or medical condition  Airway Management Planned: Oral ETT  Additional Equipment:   Intra-op Plan:   Post-operative Plan:   Informed Consent: I have reviewed the patients History and Physical, chart, labs and discussed the procedure including the risks, benefits and alternatives for the proposed anesthesia with the patient or authorized representative who has indicated his/her understanding and acceptance.     Dental  advisory given and Consent reviewed with POA  Plan Discussed with: CRNA and Surgeon  Anesthesia Plan Comments: (Plan routine monitors, GETA  Discussed with son and pt together)       Anesthesia Quick Evaluation

## 2019-01-29 NOTE — H&P (Signed)
   History and Physical Update  The patient was interviewed and re-examined.  The patient's previous History and Physical has been reviewed and is unchanged from recent office visit.  I discussed with the patient as well as her power of attorney Asa Lente and the son Clifton James who is come to the bedside.  Ultimately we have made the decision to proceed with left below-knee amputation given that I think transmetatarsal amputation is unlikely to heal and patient wants as few procedures as possible.  Do think this gives her the best chance for long-term survival given removal of source of infection as well as will also provide pain control.  Consent is signed.   Lakelyn Straus C. Donzetta Matters, MD [facility] [place] Office: [phone] Pager: [phone]   01/29/2019, 12:39 PM

## 2019-01-29 NOTE — Progress Notes (Signed)
Permission granted by [facility] for son to visit with patient in Short Stay. Security notified.

## 2019-01-29 NOTE — Transfer of Care (Signed)
Immediate Anesthesia Transfer of Care Note  Patient: Jenna Marquez  Procedure(s) Performed: LEFT BELOW KNEE AMPUATION (Left Leg Lower)  Patient Location: PACU  Anesthesia Type:General  Level of Consciousness: awake  Airway & Oxygen Therapy: Patient Spontanous Breathing and Patient connected to face mask oxygen  Post-op Assessment: Report given to RN  Post vital signs: Reviewed and stable  Last Vitals:  Vitals Value Taken Time  BP 140/118 01/29/19 1445  Temp    Pulse 90 01/29/19 1447  Resp 14 01/29/19 1448  SpO2 87 % 01/29/19 1447  Vitals shown include unvalidated device data.  Last Pain:  Vitals:   01/29/19 1050  TempSrc:   PainSc: 0-No pain      Patients Stated Pain Goal: 2 (02/12/15 0109)  Complications: No apparent anesthesia complications

## 2019-01-29 NOTE — Anesthesia Procedure Notes (Signed)
Procedure Name: Intubation Date/Time: 01/29/2019 1:46 PM Performed by: Barrington Ellison, CRNA Pre-anesthesia Checklist: Patient identified, Emergency Drugs available, Suction available and Patient being monitored Patient Re-evaluated:Patient Re-evaluated prior to induction Oxygen Delivery Method: Circle System Utilized Preoxygenation: Pre-oxygenation with 100% oxygen Induction Type: IV induction Ventilation: Mask ventilation without difficulty Laryngoscope Size: Mac and 3 Grade View: Grade II Tube type: Oral Tube size: 7.0 mm Number of attempts: 1 Airway Equipment and Method: Stylet and Oral airway Placement Confirmation: ETT inserted through vocal cords under direct vision,  positive ETCO2 and breath sounds checked- equal and bilateral Secured at: 21 cm Tube secured with: Tape Dental Injury: Teeth and Oropharynx as per pre-operative assessment

## 2019-01-29 NOTE — Progress Notes (Signed)
Lengthy discussion with pt. & son of pt. on cell speaker phone.  Discussing option of surgery to lessen pain & learn a new way of living.  Pt. Falling into dozing state several times during the conversation but she has stated that she understands the plan to do a BKA.

## 2019-01-29 NOTE — Progress Notes (Signed)
Spoke with Dr. Jenita Seashore, regarding mgt. Of pacemaker. She will take care of the pacemaker  under her care . Ok given for staff to access PICC on R upper arm.

## 2019-01-29 NOTE — Plan of Care (Signed)
Care plan progressing

## 2019-01-29 NOTE — Progress Notes (Signed)
Pt had taken dressing off of left stump again. Slightly bleeding. Site cleaned with normal saline. covered with 4x4, kurlex and ace wrap. Pt had also pulled her PIV out of left forearm, cathter tip intact. Instructed pt to keep her hand off her dressing, pt voiced understanding but she is very confused and needs constant reminder. Pt was complaining of pain, prn pain meds given. Mittens applied to  Bilateral hand. Bed alarm on. Will continue to monitor.

## 2019-01-29 NOTE — Progress Notes (Addendum)
Went to pt's room with pm medication, pt had taken ace wrap off of left leg surgical site. Pt was bleeding from left stump. Pressure applied for 5-10 min. Charge nurse and NT  in the room for help. Covered with 4x4, abd pad, kurlex and ace wrap. instructed pt not to take it off. Pt voiced understanding but she is very confused. Mittens to bilateral hands applied. Bed alarm on. Will continue to monitor.

## 2019-01-29 NOTE — Progress Notes (Signed)
Spoke to [place] and she reports that patient had metoprolol this am and that patient has been NPO since at least midnight with the exception of a sip of water for the metoprolol

## 2019-01-29 NOTE — Anesthesia Postprocedure Evaluation (Signed)
Anesthesia Post Note  Patient: Jenna Marquez  Procedure(s) Performed: LEFT BELOW KNEE AMPUATION (Left Leg Lower)     Patient location during evaluation: PACU Anesthesia Type: General Level of consciousness: awake and alert, patient cooperative and oriented Pain control: pain improving. Vital Signs Assessment: post-procedure vital signs reviewed and stable Respiratory status: spontaneous breathing, nonlabored ventilation, respiratory function stable and patient connected to nasal cannula oxygen Cardiovascular status: blood pressure returned to baseline and stable Postop Assessment: no apparent nausea or vomiting Anesthetic complications: no                 Tanish Prien,E. Thaddeus Evitts

## 2019-01-29 NOTE — Op Note (Signed)
    Patient name: IZZABELLA BESSE MRN: [id number] DOB: [birth year] Sex: female  01/29/2019 Pre-operative Diagnosis: left foot gangrene Post-operative diagnosis:  Same Surgeon:  Erlene Quan C. Donzetta Matters, MD Assistant: Ellsworth Lennox, RN Procedure Performed:  Left below knee amputation  Indications: 73 year old female with history of gangrenous changes of her left foot.  She is undergone revascularization of her left SFA has worsening gangrene is indicated for below-knee amputation.  I had extensive discussion with the patient as well as her family and they agreed to proceed.  Findings: There is adequate bleeding below the knee to suggest healing.   Procedure:  The patient was identified in the holding area and taken to the operating room where she placed by operative when general anesthesia induced.  She was gently prepped and draped in the left lower extremity usual fashion antibiotics were administered and timeout called.  We began working out at two thirds 1/3 type incision below the knee.  Tourniquet was then placed above the knee Esmarch was used to exsanguinate the leg and tourniquet was inflated 250 mmHg.  Incision was then made below the knee.  We dissected down to the bone the periosteum was elevated.  Tibia is transected with Gigli saw with an anterior bevel.  Then dissected down to the fibula and this was transected with bone clipper.  Posterior flap was created with amputation knife.  All vessels were clipped and as well as the nerve.  Tourniquet was allowed down.  Vessels were suture-ligated nerve was tied off after being pulled on tension.  Wound was irrigated hemostasis obtained.  We then closed the fascia with interrupted 2-0 Vicryl suture.  Skin was closed with staples.  Dry dressing was placed.  She was awakened anesthesia having tolerated procedure without immediate complication.  All counts were correct at completion.  EBL: 50 cc   Moniqua Engebretsen C. Donzetta Matters, MD [facility] [place]  Office: [phone] Pager: [phone]

## 2019-01-30 ENCOUNTER — Telehealth (HOSPITAL_COMMUNITY): Payer: Self-pay

## 2019-01-30 ENCOUNTER — Encounter (HOSPITAL_COMMUNITY): Payer: Self-pay | Admitting: Vascular Surgery

## 2019-01-30 LAB — BASIC METABOLIC PANEL
Anion gap: 11 (ref 5–15)
BUN: 18 mg/dL (ref 8–23)
CO2: 27 mmol/L (ref 22–32)
Calcium: 8 mg/dL — ABNORMAL LOW (ref 8.9–10.3)
Chloride: 97 mmol/L — ABNORMAL LOW (ref 98–111)
Creatinine, Ser: 2.07 mg/dL — ABNORMAL HIGH (ref 0.44–1.00)
GFR calc Af Amer: 26 mL/min — ABNORMAL LOW (ref >60)
GFR calc non Af Amer: 23 mL/min — ABNORMAL LOW (ref >60)
Glucose, Bld: 94 mg/dL (ref 70–99)
Potassium: 3.5 mmol/L (ref 3.5–5.1)
Sodium: 135 mmol/L (ref 135–145)

## 2019-01-30 LAB — CBC
HCT: 22.8 % — ABNORMAL LOW (ref 36.0–46.0)
Hemoglobin: 7.4 g/dL — ABNORMAL LOW (ref 12.0–15.0)
MCH: 30.8 pg (ref 26.0–34.0)
MCHC: 32.5 g/dL (ref 30.0–36.0)
MCV: 95 fL (ref 80.0–100.0)
Platelets: 486 10*3/uL — ABNORMAL HIGH (ref 150–400)
RBC: 2.4 MIL/uL — ABNORMAL LOW (ref 3.87–5.11)
RDW: 16.9 % — ABNORMAL HIGH (ref 11.5–15.5)
WBC: 11.9 10*3/uL — ABNORMAL HIGH (ref 4.0–10.5)
nRBC: 0 % (ref 0.0–0.2)

## 2019-01-30 NOTE — Evaluation (Signed)
Physical Therapy Evaluation Patient Details Name: Jenna Marquez MRN: [id number] DOB: May 28, 1946 Today's Date: 01/30/2019   History of Present Illness  73 year old female with history of gangrenous changes of her left foot.Now s/p L BKA. Pt has a PMh including Acute respiratory failure with hypoxia (09/30/2016, 04/24/2018), Alcohol abuse, Anxiety, asthma, CKD, COPD, Head injury (10/23/2012), Heart block atrioventricular, HTN, Lumbar radicular pain, MDD, Pacemaker (04/22/2015),PVD, Tobacco abuse, Cardiac catheterization (01/07/2015); Abdominal Aortogram (Bilateral, 01/14/2019); peripheral vasular atherectomy (Left, 01/14/2019).  Clinical Impression  Pt was seen for evaluation with a crossover of PT and OT for mobility management due to pt confusion.  Pt is able to assist to side of bed but requires more tactile and verbal cues to manage her motor planning, and is not comfortable with a walker to assist to stand. Pt will be seen acutely for strengthening as pain allows on LLE, ROM to knee for potential to fit a prosthesis and for balance training with sitting esp to recover her skills with wc mobility.    Follow Up Recommendations SNF    Equipment Recommendations  None recommended by PT    Recommendations for Other Services       Precautions / Restrictions Precautions Precautions: Fall Precaution Comments: Mitts, monitor tendency to lean on stump by pt Restrictions Weight Bearing Restrictions: Yes LLE Weight Bearing: Non weight bearing Other Position/Activity Restrictions: new BKA      Mobility  Bed Mobility Overal bed mobility: Needs Assistance Bed Mobility: Supine to Sit;Sit to Supine Rolling: Min assist   Supine to sit: Mod assist Sit to supine: Mod assist   General bed mobility comments: total A to move up towards the head of the bed in supine  Transfers Overall transfer level: Needs assistance Equipment used: Rolling walker (2 wheeled);1 person hand held assist Transfers: Sit  to/from Stand Sit to Stand: Mod assist;From elevated surface(to partially stand from bed)         General transfer comment: Pt could not follow instructions to use the walker, but with greater assist and cues of PT directly assisting and supporting R knee could get up from bed  Ambulation/Gait             General Gait Details: unable to walk  Hotel manager mobility: (not able to tolerate to attempt)  Modified Rankin (Stroke Patients Only)       Balance Overall balance assessment: Needs assistance Sitting-balance support: Single extremity supported;Bilateral upper extremity supported Sitting balance-Leahy Scale: Poor Sitting balance - Comments: min guard to maintain sitting balance control                                     Pertinent Vitals/Pain Pain Assessment: Faces Faces Pain Scale: Hurts even more Pain Location: LLE Pain Descriptors / Indicators: Operative site guarding Pain Intervention(s): Repositioned;Premedicated before session;Monitored during session    Home Living Family/patient expects to be discharged to:: Skilled nursing facility                      Prior Function Level of Independence: Needs assistance   Gait / Transfers Assistance Needed: staff transferred to [facility] and pt could propel  ADL's / Homemaking Assistance Needed: SNF for dressing and bathing        Hand Dominance   Dominant Hand: Right    Extremity/Trunk Assessment  Upper Extremity Assessment Upper Extremity Assessment: Generalized weakness    Lower Extremity Assessment Lower Extremity Assessment: Generalized weakness LLE Deficits / Details: new BK but is not able to move to extend knee without tactile and verbal cues, needs to see the limb    Cervical / Trunk Assessment Cervical / Trunk Assessment: Normal  Communication   Communication: No difficulties  Cognition Arousal/Alertness:  Awake/alert Behavior During Therapy: WFL for tasks assessed/performed Overall Cognitive Status: No family/caregiver present to determine baseline cognitive functioning Area of Impairment: Orientation;Memory;Following commands;Safety/judgement;Awareness;Problem solving                 Orientation Level: Situation   Memory: Decreased recall of precautions;Decreased short-term memory Following Commands: Follows one step commands inconsistently;Follows one step commands with increased time Safety/Judgement: Decreased awareness of safety Awareness: Intellectual Problem Solving: Slow processing;Requires verbal cues;Requires tactile cues General Comments: does not initiate tasks, requires multimodal cues, Pt very pleasant throughout, confused, and started crying multiple times (seemed emotional more so than reaction to pain)      General Comments General comments (skin integrity, edema, etc.): VSS throughout session    Exercises Other Exercises Other Exercises: 3+ to 4- strength elicited RLE and LLE is too sensitive to test   Assessment/Plan    PT Assessment Patient needs continued PT services  PT Problem List Decreased strength;Decreased range of motion;Decreased activity tolerance;Decreased balance;Decreased mobility;Decreased coordination;Decreased cognition;Decreased knowledge of use of DME;Decreased safety awareness;Decreased knowledge of precautions;Cardiopulmonary status limiting activity;Decreased skin integrity;Pain       PT Treatment Interventions DME instruction;Functional mobility training;Therapeutic activities;Therapeutic exercise;Balance training;Neuromuscular re-education;Patient/family education    PT Goals (Current goals can be found in the Care Plan section)  Acute Rehab PT Goals Patient Stated Goal: to get home PT Goal Formulation: With patient Time For Goal Achievement: 02/13/19 Potential to Achieve Goals: Good    Frequency Min 2X/week   Barriers to  discharge   lives in SNF and will have assistance    Co-evaluation               AM-PAC PT "6 Clicks" Mobility  Outcome Measure Help needed turning from your back to your side while in a flat bed without using bedrails?: A Little Help needed moving from lying on your back to sitting on the side of a flat bed without using bedrails?: A Lot Help needed moving to and from a bed to a chair (including a wheelchair)?: A Lot Help needed standing up from a chair using your arms (e.g., wheelchair or bedside chair)?: A Lot Help needed to walk in hospital room?: Total Help needed climbing 3-5 steps with a railing? : Total 6 Click Score: 11    End of Session Equipment Utilized During Treatment: Gait belt Activity Tolerance: Patient tolerated treatment well;Patient limited by fatigue;Patient limited by pain Patient left: in bed;with call bell/phone within reach;with bed alarm set;Other (comment)(OT was in for therapy) Nurse Communication: Mobility status PT Visit Diagnosis: Muscle weakness (generalized) (M62.81);Other abnormalities of gait and mobility (R26.89);Difficulty in walking, not elsewhere classified (R26.2);Pain Pain - Right/Left: Left Pain - part of body: Knee    Time: 7579-7282 PT Time Calculation (min) (ACUTE ONLY): 29 min   Charges:   PT Evaluation $PT Eval Moderate Complexity: 1 Mod         Ramond Dial 01/30/2019, 12:19 PM  Mee Hives, PT MS Acute Rehab Dept. Number: [place]

## 2019-01-30 NOTE — Telephone Encounter (Signed)
Returned call received from [employer]. She denies questions at this time and has PV Navigator contact information should she need to reach her.  Cletis Media RN BSN CWS [place]

## 2019-01-30 NOTE — Telephone Encounter (Signed)
Reached out to pt's HCPOA to discuss any questions or barriers regarding Jenna Marquez's care or potential plans for when she is discharged. No answer at this time. Left a message with PV Navigator's contact information for a return call.  Cletis Media RN BSN CWS [place]

## 2019-01-30 NOTE — Progress Notes (Addendum)
[facility] [place]  Subjective  - Pleasantly confused.    Objective (!) 143/58 81 (!) 97.4 F (36.3 C) (Oral) 16 94%  Intake/Output Summary (Last 24 hours) at 01/30/2019 6387 Last data filed at 01/30/2019 0300 Gross per 24 hour  Intake 682.63 ml  Output 100 ml  Net 582.63 ml    Left BKA dressing clean and dry Lungs non labored breathing Gen NAD  Assessment/Planning: POD # 1 left BKA  Plan to change the left BKA dressing tomorrow Plan return to SNF F/U in our office in 4 weeks for staple removal  Jenna Marquez 01/30/2019 8:22 AM --  Laboratory Lab Results: Recent Labs    01/29/19 1026 01/30/19 0618  WBC 15.2* 11.9*  HGB 8.7* 7.4*  HCT 28.0* 22.8*  PLT 530* 486*   BMET Recent Labs    01/29/19 1026 01/30/19 0618  NA 138 135  K 3.5 3.5  CL 97* 97*  CO2 27 27  GLUCOSE 80 94  BUN 18 18  CREATININE 2.30* 2.07*  CALCIUM 8.9 8.0*    COAG Lab Results  Component Value Date   INR 1.0 12/23/2018   INR 0.97 02/18/2018   INR 1.06 01/07/2015   No results found for: PTT   I have independently interviewed and examined patient and agree with PA assessment and plan above.   Jenna Mehl C. Donzetta Matters, MD [facility] [place] Office: [phone] Pager: [phone]

## 2019-01-30 NOTE — Progress Notes (Signed)
Updates given to Jenna Marquez and Jenna Marquez.

## 2019-01-30 NOTE — Consult Note (Signed)
PV Navigator consult acknowledged and chart reviewed. Working remotely.  I was able to reach out to patient's [facility], who is very informed regarding [name]. She states patient was living at home and doing very well until about 7 years ago when her husband passed away and then she began to slowly decline. She then hired a private caregiver to stay with her at home. States her baseline mentality is alert and oriented, however she has been confused since this hospitalization due to infection and medications. States she was residing at [facility] prior to admission due to a recent fall resulting in a hip fracture and was receiving rehab there when they began to notice the changes in her foot. Plan is for her to return to SNF post discharge for continued rehab. And once rehab completed, home with 24 hour care.  She denies any questions or concerns at this time. Albesa Seen my contact information should questions or concerns arise during her hospitalization or post discharge. Nisha verbalized her understanding.  Will continue to follow. Thank you for this consult.  Cletis Media RN BSN CWS [place] [phone]

## 2019-01-30 NOTE — Evaluation (Signed)
Occupational Therapy Evaluation Patient Details Name: Jenna Marquez MRN: [id number] DOB: 1945/10/06 Today's Date: 01/30/2019    History of Present Illness 73 year old female with history of gangrenous changes of her left foot.Now s/p L BKA. Pt has a PMh including Acute respiratory failure with hypoxia (09/30/2016, 04/24/2018), Alcohol abuse, Anxiety, asthma, CKD, COPD, Head injury (10/23/2012), Heart block atrioventricular, HTN, Lumbar radicular pain, MDD, Pacemaker (04/22/2015),PVD, Tobacco abuse, Cardiac catheterization (01/07/2015); Abdominal Aortogram (Bilateral, 01/14/2019); peripheral vasular atherectomy (Left, 01/14/2019).   Clinical Impression   PTA Pt was living at skilled nursing facility, using wc for mobility and assist for transfers as well as assist for bathing/dressing. Pt reports that she was independent for grooming/feeding. Pt is pleasantly confused, labile throughout session. Today Pt is mod A for bed mobility, able to sit EOB (min A to min guard for unsupported seating) with mod A for grooming activities. She required multimodal cues for task initiation and sequencing. Pt will benefit from skilled OT in the acute setting as well as afterwards with return to SNF. RN requested no chair due to confusion/safety. Next session plan on [facility] transfer.    Follow Up Recommendations  SNF;Supervision/Assistance - 24 hour    Equipment Recommendations  Other (comment)(defer to next venue)    Recommendations for Other Services       Precautions / Restrictions Precautions Precautions: Fall Precaution Comments: Bil Mitts Restrictions Weight Bearing Restrictions: Yes LLE Weight Bearing: Non weight bearing Other Position/Activity Restrictions: new BKA      Mobility Bed Mobility Overal bed mobility: Needs Assistance Bed Mobility: Rolling;Supine to Sit;Sit to Supine Rolling: Min assist(cues for sequencing, physical assist to initiate)   Supine to sit: Mod assist;HOB elevated(cues for  sequencing, use of bed pad to assist and trunk elev) Sit to supine: Mod assist(assist for RLE back into bed, use of bed pad)   General bed mobility comments: total A to move up towards the head of the bed in supine  Transfers                 General transfer comment: NT this session (recently up with PT)    Balance Overall balance assessment: Needs assistance Sitting-balance support: Single extremity supported;No upper extremity supported;Feet supported Sitting balance-Leahy Scale: Poor Sitting balance - Comments: min guard to min A for seated grooming tasks                                   ADL either performed or assessed with clinical judgement   ADL Overall ADL's : Needs assistance/impaired Eating/Feeding: Minimal assistance;Sitting;Bed level   Grooming: Oral care;Wash/dry hands;Wash/dry face;Moderate assistance;Sitting;Cueing for sequencing Grooming Details (indicate cue type and reason): Pt provided with toothbrush with toothpaste loaded on it and she required simple one step commands for task completion, as well as for wiping face "don't forget your eyes...don't forget your..." min guard to min A for seated balance EOB Upper Body Bathing: Moderate assistance   Lower Body Bathing: Moderate assistance   Upper Body Dressing : Minimal assistance   Lower Body Dressing: Maximal assistance   Toilet Transfer: Moderate assistance;Squat-pivot;BSC   Toileting- Clothing Manipulation and Hygiene: Total assistance;Bed level Toileting - Clothing Manipulation Details (indicate cue type and reason): Pt was incontinent in bed, Pt was cleaned and new linen provided       General ADL Comments: confusion impacting ability to perform ADL. Pt labile throughout session, decreased balance and access to LB for ADL  Vision Baseline Vision/History: Wears glasses Wears Glasses: At all times Patient Visual Report: No change from baseline       Perception     Praxis       Pertinent Vitals/Pain Pain Assessment: Faces Faces Pain Scale: Hurts little more Pain Location: LLE, lower back with prolongued unsupported sitting Pain Descriptors / Indicators: Discomfort;Grimacing;Sore(itching) Pain Intervention(s): Monitored during session;Repositioned     Hand Dominance Right   Extremity/Trunk Assessment Upper Extremity Assessment Upper Extremity Assessment: Generalized weakness   Lower Extremity Assessment Lower Extremity Assessment: LLE deficits/detail LLE Deficits / Details: new BKA, multimodal cues about NWB   Cervical / Trunk Assessment Cervical / Trunk Assessment: Normal   Communication Communication Communication: No difficulties   Cognition Arousal/Alertness: Awake/alert Behavior During Therapy: WFL for tasks assessed/performed(labile) Overall Cognitive Status: No family/caregiver present to determine baseline cognitive functioning Area of Impairment: Orientation;Memory;Following commands;Safety/judgement;Awareness;Problem solving                 Orientation Level: Disoriented to;Situation   Memory: Decreased short-term memory;Decreased recall of precautions Following Commands: Follows one step commands with increased time Safety/Judgement: Decreased awareness of safety;Decreased awareness of deficits Awareness: Intellectual Problem Solving: Slow processing;Difficulty sequencing;Requires tactile cues;Requires verbal cues General Comments: does not initiate tasks, requires multimodal cues, Pt very pleasant throughout, confused, and started crying multiple times (seemed emotional more so than reaction to pain)   General Comments  VSS throughout session    Exercises     Shoulder Instructions      Home Living Family/patient expects to be discharged to:: Skilled nursing facility                                        Prior Functioning/Environment Level of Independence: Needs assistance  Gait / Transfers  Assistance Needed: wc for mobility ADL's / Homemaking Assistance Needed: assist at SNF            OT Problem List: Decreased strength;Decreased range of motion;Decreased activity tolerance;Impaired balance (sitting and/or standing);Decreased cognition;Decreased safety awareness;Decreased knowledge of use of DME or AE;Decreased knowledge of precautions;Pain      OT Treatment/Interventions: Self-care/ADL training;DME and/or AE instruction;Therapeutic activities;Patient/family education;Balance training;Cognitive remediation/compensation    OT Goals(Current goals can be found in the care plan section) Acute Rehab OT Goals Patient Stated Goal: "To be be able to sit up by myself" OT Goal Formulation: With patient Time For Goal Achievement: 02/13/19 Potential to Achieve Goals: Good ADL Goals Pt Will Perform Grooming: sitting;with set-up Pt Will Perform Upper Body Bathing: with set-up;sitting Pt Will Perform Lower Body Bathing: sitting/lateral leans;with min assist Pt Will Transfer to Toilet: with min assist;squat pivot transfer;bedside commode Pt Will Perform Toileting - Clothing Manipulation and hygiene: with supervision;sitting/lateral leans Additional ADL Goal #1: Pt will perform bed mobility at supervision level prior to engaging in ADL  OT Frequency: Min 2X/week   Barriers to D/C:            Co-evaluation              AM-PAC OT "6 Clicks" Daily Activity     Outcome Measure Help from another person eating meals?: A Little Help from another person taking care of personal grooming?: A Lot Help from another person toileting, which includes using toliet, bedpan, or urinal?: A Lot Help from another person bathing (including washing, rinsing, drying)?: A Lot Help from another person to put on and taking off regular  upper body clothing?: A Little Help from another person to put on and taking off regular lower body clothing?: Total 6 Click Score: 13   End of Session Nurse  Communication: Mobility status  Activity Tolerance: Patient tolerated treatment well Patient left: in bed;with call bell/phone within reach;with bed alarm set;Other (comment)(all 4 rails up)  OT Visit Diagnosis: Other abnormalities of gait and mobility (R26.89);Other symptoms and signs involving cognitive function;Pain Pain - Right/Left: Left Pain - part of body: Leg                Time: 0973-5329 OT Time Calculation (min): 21 min Charges:  OT General Charges $OT Visit: 1 Visit OT Evaluation $OT Eval Moderate Complexity: [place] OTR/L Acute Rehabilitation Services Pager: [phone] Office: [place] 01/30/2019, 10:57 AM

## 2019-01-30 NOTE — Progress Notes (Signed)
This chaplain responded to the RN-Fola request for Pt. prayer.  The chaplain listened and joined the Pt. in prayer.  The chaplain is available for F/U spiritual care as needed.

## 2019-01-31 LAB — CBC
HCT: 21.6 % — ABNORMAL LOW (ref 36.0–46.0)
Hemoglobin: 6.9 g/dL — CL (ref 12.0–15.0)
MCH: 30.3 pg (ref 26.0–34.0)
MCHC: 31.9 g/dL (ref 30.0–36.0)
MCV: 94.7 fL (ref 80.0–100.0)
Platelets: 466 10*3/uL — ABNORMAL HIGH (ref 150–400)
RBC: 2.28 MIL/uL — ABNORMAL LOW (ref 3.87–5.11)
RDW: 16.8 % — ABNORMAL HIGH (ref 11.5–15.5)
WBC: 9.8 10*3/uL (ref 4.0–10.5)
nRBC: 0 % (ref 0.0–0.2)

## 2019-01-31 LAB — BASIC METABOLIC PANEL
Anion gap: 8 (ref 5–15)
BUN: 20 mg/dL (ref 8–23)
CO2: 30 mmol/L (ref 22–32)
Calcium: 7.9 mg/dL — ABNORMAL LOW (ref 8.9–10.3)
Chloride: 99 mmol/L (ref 98–111)
Creatinine, Ser: 2.21 mg/dL — ABNORMAL HIGH (ref 0.44–1.00)
GFR calc Af Amer: 24 mL/min — ABNORMAL LOW (ref >60)
GFR calc non Af Amer: 21 mL/min — ABNORMAL LOW (ref >60)
Glucose, Bld: 97 mg/dL (ref 70–99)
Potassium: 3.4 mmol/L — ABNORMAL LOW (ref 3.5–5.1)
Sodium: 137 mmol/L (ref 135–145)

## 2019-01-31 LAB — PREPARE RBC (CROSSMATCH)

## 2019-01-31 LAB — ABO/RH: ABO/RH(D): O NEG

## 2019-01-31 MED ORDER — HYDROCODONE-ACETAMINOPHEN 5-325 MG PO TABS: 1.0000 | ORAL_TABLET | Freq: Four times a day (QID) | ORAL | 0 refills | Status: AC | PRN

## 2019-01-31 MED ORDER — SODIUM CHLORIDE 0.9% IV SOLUTION: Freq: Once | INTRAVENOUS | Status: DC

## 2019-01-31 NOTE — Progress Notes (Signed)
Verbal orders given  via Dr. Donnetta Hutching to type and screen pt for 2 units but not to transfuse to leave transfusion order up to attending physician this AM. Pt stable at this time will continue to monitor.

## 2019-01-31 NOTE — Discharge Summary (Addendum)
Discharge Summary    KENZLIE DISCH 1946/06/08 73 y.o. female  [id number]  Admission Date: 01/29/2019  Discharge Date: 02/01/2019  Physician: Thomes Lolling*  Admission Diagnosis: GANGRENE LEFT FOOT   HPI:   This is a 73 y.o. female Admitted with right hip fracture. Noted during this admission to have dark colored toes left foot this admission. She is confused and can't provide much history. Pt nurse says it only hurts when they touch it. Former smokere quit 10 years ago. Other medical problems include paroxysmal afib, COPD CkD 2.  Critical left lower extremity ischemia with toe ulcerations and underwent aortogram with intervention:  Procedure Performed: 1.Ultrasound-guided cannulation right common femoral artery 2.Aortogram bilateral lower extremity runoff 3.CSI atherectomy left SFA with 2.0 classic 4.Drug-coated balloon angioplasty left SFA with 5 x 250 mm in.pact 5.CSI atherectomy left posterior tibial artery with 1.25 micro 6.Balloon angioplasty left posterior tibial artery with 2.5 mm balloon 7.Moderate sedation with fentanyl and Versed for139 minutes 8.Minx device closure right common femoral artery             Findings:The aorta was heavily calcified. The right side which was not the site of interest there is SFA moderate grade disease without flow-limiting stenosis. Runoff is via the posterior tibial artery to the ankle and foot. The left side which is the area of interest has heavy calcification proximal SFA which is nearly occluded. There are multiple approximately 60% stenoses throughout the rest of the SFA. Anterior tibial and peroneal arteries run to the level of the ankle and give out. There is not appear to be any flow into the foot. The posterior tibial artery is patent to the mid calf and then appears to reconstitute distal foot. After intervention there is no further stenoses in the SFA. Unfortunately after intervention the  posterior tibial artery we still do not have outflow into the foot.  Patient will remain high risk for left lower extremity amputation.  Dr Donzetta Matters discussed that he recommended toe amputations verses transtibial amputation of the left LE.  The patien did not want to consider this option.  She returns today for further discussion.  She is very tearful and in obvious pain.  She is unable to straighten her knee and holds her leg bent back to protect it.  She denise fever and chills.  She has blistering and black eschar over the left forefoot.   Hospital Course:  The patient was admitted to the hospital and taken to the operating room on 01/29/2019 and underwent: Left below knee amputation    Findings: There is adequate bleeding below the knee to suggest healing.  The pt tolerated the procedure well and was transported to the PACU in good condition.   On POD 2, the bandage was removed and site was clean and dry with staples in tact.  Creatinine elevated but stable from preoperative levels.  Hematocrit is stable and she is asymptomatic so will hold transfusion given no ongoing evidence of blood loss.  She will be discharged back to her skilled nursing facility today.   Discharge was delayed to post op day 3 as they were waiting for insurance authorization.  The remainder of the hospital course consisted of increasing mobilization and increasing intake of solids without difficulty.  CBC    Component Value Date/Time   WBC 9.8 01/31/2019 0418   RBC 2.28 (L) 01/31/2019 0418   HGB 6.9 (LL) 01/31/2019 0418   HCT 21.6 (L) 01/31/2019 0418   PLT 466 (H) 01/31/2019  0418   MCV 94.7 01/31/2019 0418   MCH 30.3 01/31/2019 0418   MCHC 31.9 01/31/2019 0418   RDW 16.8 (H) 01/31/2019 0418   LYMPHSABS 1.1 01/02/2019 1116   MONOABS 1.5 (H) 01/02/2019 1116   EOSABS 0.2 01/02/2019 1116   BASOSABS 0.1 01/02/2019 1116    BMET    Component Value Date/Time   NA 137 01/31/2019 0418   K 3.4 (L) 01/31/2019  0418   CL 99 01/31/2019 0418   CO2 30 01/31/2019 0418   GLUCOSE 97 01/31/2019 0418   BUN 20 01/31/2019 0418   CREATININE 2.21 (H) 01/31/2019 0418   CALCIUM 7.9 (L) 01/31/2019 0418   GFRNONAA 21 (L) 01/31/2019 0418   GFRAA 24 (L) 01/31/2019 0418      Discharge Instructions    Discharge patient   Complete by: As directed    Discharge disposition: 03-Skilled Nursing Facility   Discharge patient date: 01/31/2019      Discharge Diagnosis:  GANGRENE LEFT FOOT  Secondary Diagnosis: Patient Active Problem List   Diagnosis Date Noted   Ischemia of extremity 01/29/2019   Depression    Adjustment disorder with depressed mood    Closed right hip fracture ([place]) 12/23/2018   Open right hip fracture ([place]) 12/23/2018   Closed displaced intertrochanteric fracture of right femur ([place]) 12/23/2018   Chronic respiratory failure with hypoxia ([place]) 04/24/2018   MDD (major depressive disorder), single episode, severe , no psychosis ([place])    Hemoptysis 02/19/2018   Community acquired pneumonia 02/19/2018   Vagina bleeding 02/19/2018   Alcohol abuse 02/19/2018   Left knee pain 07/11/2017   PVD (peripheral vascular disease) ([place]) 04/04/2017   HTN (hypertension) 11/24/2016   Paroxysmal atrial fibrillation ([place]) 09/30/2016   Mobitz type 2 second degree AV block 09/30/2016   Swelling of arm 05/22/2015   Left shoulder pain 05/22/2015   CKD (chronic kidney disease) stage 3, GFR 30-59 ml/min (HCC) 05/11/2015   Pacemaker 04/22/2015   Generalized anxiety disorder 01/11/2012   COPD (chronic obstructive pulmonary disease) gold stage C.    Tobacco abuse    Past Medical History:  Diagnosis Date   Abnormal mammogram 07/20/2011   Acute respiratory failure with hypoxia ([place]) 09/30/2016   Alcohol abuse 02/19/2018   ALLERGIC RHINITIS 10/13/2010   Qualifier: Diagnosis of  By: Ronnald Ramp CMA, [place]     Anxiety    "occasionally"   ASTHMA 10/13/2010   Qualifier: Diagnosis of  By:  Ronnald Ramp CMA, [place]     Blunt trauma of nose 06/06/2012   Chest pain 08/18/2011   Chest pain on breathing 05/11/2015   Chronic respiratory failure with hypoxia ([place]) 04/24/2018   CKD (chronic kidney disease) stage 2, GFR 60-89 ml/min 09/30/2016   COPD exacerbation ([place]) 08/16/2013   E. 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   HTN (hypertension) 11/24/2016   Indigestion 04/23/2012   Left knee pain 07/11/2017   Left shoulder pain 05/22/2015   Lumbar radicular pain 06/13/2012   MDD (major depressive disorder), single episode, severe , no psychosis ([place])    Pacemaker 04/22/2015   Palpitations 08/16/2013   Paroxysmal atrial fibrillation ([place]) 09/30/2016   Pneumonia 02/19/2018   PVD (peripheral vascular disease) ([place]) 04/04/2017   RBBB (right bundle  branch block) 07/31/2011   Swelling of arm 05/22/2015   Swollen uvula 11/14/2014   Tinnitus of both ears 10/23/2012   Tobacco abuse    QUIT 12/13    Vagina bleeding 02/19/2018   Vitamin D deficiency 01/30/2013     Allergies as of 01/31/2019      Reactions   Avelox [moxifloxacin Hcl In Nacl] Itching, Rash      Medication List    STOP taking these medications   Oxycodone HCl 10 MG Tabs   piperacillin-tazobactam  IVPB Commonly known as: ZOSYN   traMADol 50 MG tablet Commonly known as: ULTRAM   vancomycin  IVPB     TAKE these medications   acetaminophen 325 MG tablet Commonly known as: TYLENOL Take 1-2 tablets (325-650 mg total) by mouth every 6 (six) hours as needed for mild pain (pain score 1-3 or temp > 100.5). What changed: how much to take   acyclovir ointment 5 % Commonly known as: ZOVIRAX Apply 1 application topically 3 (three) times daily.     albuterol 108 (90 Base) MCG/ACT inhaler Commonly known as: ProAir HFA Inhale 1-2 puffs into the lungs every 6 (six) hours as needed for wheezing or shortness of breath.   albuterol (2.5 MG/3ML) 0.083% nebulizer solution Commonly known as: PROVENTIL Take 3 mLs (2.5 mg total) by nebulization every 6 (six) hours as needed for wheezing or shortness of breath.   ALPRAZolam 0.5 MG tablet Commonly known as: XANAX TAKE 1 TAB EVERY 8 HRS AS NEEDED FOR ANXIETY, DO NOT TAKE AT NIGHT WITH HYDROXYZINE OR WITH ALCOHOL   alum & mag hydroxide-simeth 200-200-20 MG/5ML suspension Commonly known as: MAALOX/MYLANTA Take 15 mLs by mouth every 6 (six) hours as needed for indigestion or heartburn.   atorvastatin 40 MG tablet Commonly known as: Lipitor Take 1 tablet (40 mg total) by mouth daily.   bisacodyl 10 MG suppository Commonly known as: DULCOLAX Place 1 suppository (10 mg total) rectally daily as needed for moderate constipation.   budesonide-formoterol 160-4.5 MCG/ACT inhaler Commonly known as: Symbicort Inhale 2 puffs into the lungs 2 (two) times daily.   calcium-vitamin D 250-125 MG-UNIT tablet Commonly known as: OSCAL WITH D Take 1 tablet by mouth daily.   clopidogrel 75 MG tablet Commonly known as: Plavix Take 1 tablet (75 mg total) by mouth daily.   docusate sodium 100 MG capsule Commonly known as: COLACE Take 1 capsule (100 mg total) by mouth 2 (two) times daily.   enoxaparin 40 MG/0.4ML injection Commonly known as: LOVENOX Inject 0.4 mLs (40 mg total) into the skin daily for 30 doses. For 30 days post op for DVT prophylaxis   furosemide 40 MG tablet Commonly known as: LASIX Take 1 tablet (40 mg total) by mouth daily.   guaiFENesin-dextromethorphan 100-10 MG/5ML syrup Commonly known as: ROBITUSSIN DM Take 5 mLs by mouth every 4 (four) hours as needed for cough (chest congestion).   HYDROcodone-acetaminophen 5-325 MG tablet Commonly known as: NORCO/VICODIN Take 1 tablet  by mouth every 6 (six) hours as needed for severe pain. What changed:   how much to take  additional instructions   ipratropium 0.02 % nebulizer solution Commonly known as: ATROVENT INHALE 2.5ML BY NEBULIZATION EVERY 6 HOURS AS NEEDED FOR WHEEZING OR SHORTNESS OF BREATH. What changed:   how much to take  how to take this  when to take this  reasons to take this  additional instructions   methotrexate 2.5 MG tablet Commonly known as: RHEUMATREX Take 12.5 mg by mouth  every Monday.   metoprolol tartrate 50 MG tablet Commonly known as: LOPRESSOR Take 1 tablet (50 mg total) by mouth 2 (two) times daily.   ondansetron 4 MG tablet Commonly known as: ZOFRAN Take 1 tablet (4 mg total) by mouth every 6 (six) hours as needed for nausea.   polyethylene glycol 17 g packet Commonly known as: MIRALAX / GLYCOLAX Take 17 g by mouth daily as needed for mild constipation.   predniSONE 10 MG tablet Commonly known as: DELTASONE 4 tabs by mouth once daily for 2 days, then 3 tabs daily x 2 days, then 2 tabs daily x 2 days, then 1 tab daily x 2 days   thiamine 100 MG tablet Take 1 tablet (100 mg total) by mouth daily.   Vitamin D 50 MCG (2000 UT) Caps Take 1 capsule by mouth daily.       Prescriptions given: Vicodin #30 No Refill  Instructions: 1.  Shower daily with soap and water and place dry bandage and amputation site daily.  Disposition: SNF  Patient's condition: is Good  Follow up: 1. Dr. Donzetta Matters in 4 weeks   Leontine Locket, PA-C [facility] [phone] 01/31/2019  9:53 AM

## 2019-01-31 NOTE — Progress Notes (Signed)
  Progress Note    01/31/2019 7:48 AM 2 Days Post-Op  Subjective: She remains tearful but is consolable  Vitals:   01/30/19 1951 01/31/19 0315  BP: (!) 119/55 (!) 123/43  Pulse: 70 60  Resp: 15 13  Temp: (!) 97.5 F (36.4 C) (!) 97.5 F (36.4 C)  SpO2: 100% 99%    Physical Exam: She is awake and alert Nonlabored respirations Left leg amputation site clean dry intact with staples  CBC    Component Value Date/Time   WBC 9.8 01/31/2019 0418   RBC 2.28 (L) 01/31/2019 0418   HGB 6.9 (LL) 01/31/2019 0418   HCT 21.6 (L) 01/31/2019 0418   PLT 466 (H) 01/31/2019 0418   MCV 94.7 01/31/2019 0418   MCH 30.3 01/31/2019 0418   MCHC 31.9 01/31/2019 0418   RDW 16.8 (H) 01/31/2019 0418   LYMPHSABS 1.1 01/02/2019 1116   MONOABS 1.5 (H) 01/02/2019 1116   EOSABS 0.2 01/02/2019 1116   BASOSABS 0.1 01/02/2019 1116    BMET    Component Value Date/Time   NA 137 01/31/2019 0418   K 3.4 (L) 01/31/2019 0418   CL 99 01/31/2019 0418   CO2 30 01/31/2019 0418   GLUCOSE 97 01/31/2019 0418   BUN 20 01/31/2019 0418   CREATININE 2.21 (H) 01/31/2019 0418   CALCIUM 7.9 (L) 01/31/2019 0418   GFRNONAA 21 (L) 01/31/2019 0418   GFRAA 24 (L) 01/31/2019 0418    INR    Component Value Date/Time   INR 1.0 12/23/2018 0748     Intake/Output Summary (Last 24 hours) at 01/31/2019 0748 Last data filed at 01/30/2019 1824 Gross per 24 hour  Intake 1124.75 ml  Output -  Net 1124.75 ml     Assessment/plan:  73 y.o. female is s/p left below-knee amputation.  Creatinine elevated but stable from preoperative levels.  Hematocrit is stable and she is asymptomatic so will hold transfusion given no ongoing evidence of blood loss.   Sachin Ferencz C. Donzetta Matters, MD [facility] [place] Office: [phone] Pager: [phone]  01/31/2019 7:48 AM

## 2019-01-31 NOTE — Progress Notes (Signed)
Notified by lab Hgb 6.9. Dr. Donnetta Hutching with vascular paged. Awaiting orders.

## 2019-01-31 NOTE — NC FL2 (Addendum)
[place] LEVEL OF CARE SCREENING TOOL     IDENTIFICATION  Patient Name: Jenna Marquez Birthdate: [birth year] Sex: female Admission Date (Current Location): 01/29/2019  [facility] and [place] Number:  Herbalist and Address:  [facility], [place] [address], [place], [place] [address]      Provider Number: [id number]  Attending Physician Name and Address:  Cain, [place] Name and Phone Number:  Bowlby,Jacquanisha "KGMWN"[phone]    Current Level of Care: Hospital Recommended Level of Care: [place] Prior Approval Number:    Date Approved/Denied:   PASRR Number: PSARR pending  Discharge Plan: SNF    Current Diagnoses: Patient Active Problem List   Diagnosis Date Noted  . Ischemia of extremity 01/29/2019  . Depression   . Adjustment disorder with depressed mood   . Closed right hip fracture ([place]) 12/23/2018  . Open right hip fracture ([place]) 12/23/2018  . Closed displaced intertrochanteric fracture of right femur ([place]) 12/23/2018  . Chronic respiratory failure with hypoxia ([place]) 04/24/2018  . MDD (major depressive disorder), single episode, severe , no psychosis ([place])   . Hemoptysis 02/19/2018  . Community acquired pneumonia 02/19/2018  . Vagina bleeding 02/19/2018  . Alcohol abuse 02/19/2018  . Left knee pain 07/11/2017  . PVD (peripheral vascular disease) ([place]) 04/04/2017  . HTN (hypertension) 11/24/2016  . Paroxysmal atrial fibrillation ([place]) 09/30/2016  . Mobitz type 2 second degree AV block 09/30/2016  . Swelling of arm 05/22/2015  . Left shoulder pain 05/22/2015  . CKD (chronic kidney disease) stage 3, GFR 30-59 ml/min (HCC) 05/11/2015  . Pacemaker 04/22/2015  . Generalized anxiety disorder 01/11/2012  . 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   . Tobacco abuse     Orientation RESPIRATION BLADDER Height & Weight     Self, Time, Situation, Place  O2(nasal  cannula (2l/min)) Incontinent Weight: 134 lb 14.7 oz (61.2 kg) Height:  5\' 6"  (167.6 cm)  BEHAVIORAL SYMPTOMS/MOOD NEUROLOGICAL BOWEL NUTRITION STATUS      Continent Diet(Please see discharge summary)  AMBULATORY STATUS COMMUNICATION OF NEEDS Skin       Surgical wounds(incision(closed)leg,left, incision(closed) sacrum, other)                       Personal Care Assistance Level of Assistance  Bathing, Feeding, Dressing Bathing Assistance: Limited assistance   Dressing Assistance: Limited assistance     Functional Limitations Info  Sight, Hearing, Speech Sight Info: Adequate Hearing Info: Adequate Speech Info: Adequate    SPECIAL CARE FACTORS FREQUENCY  PT (By licensed PT), OT (By licensed OT)     PT Frequency: 3x per week OT Frequency: 3x per week            Contractures Contractures Info: Not present    Additional Factors Info  Code Status, Allergies Code Status Info: FULL Allergies Info: NKA           Current Medications (01/31/2019):  This is the current hospital active medication list Current Facility-Administered Medications  Medication Dose Route Frequency Provider Last Rate Last Dose  . 0.9 %  sodium chloride infusion (Manually program via Guardrails IV Fluids)   Intravenous Once Early, Arvilla Meres, MD      . 0.9 %  sodium chloride infusion   Intravenous Continuous Gabriel Earing, PA-C 50 mL/hr at 01/29/19 2120    . acetaminophen (TYLENOL) tablet 325-650 mg  325-650 mg Oral Q6H PRN Hazelyn Kallen, Hulen Shouts,  PA-C      . acyclovir ointment (ZOVIRAX) 5 % 1 application  1 application Topical TID Gabriel Earing, PA-C   1 application at 01/60/10 0942  . albuterol (PROVENTIL) (2.5 MG/3ML) 0.083% nebulizer solution 2.5 mg  2.5 mg Nebulization Q6H PRN Ijeoma Loor, Hulen Shouts, PA-C      . ALPRAZolam Duanne Moron) tablet 0.5 mg  0.5 mg Oral TID PRN Corbitt Cloke J, PA-C   0.5 mg at 01/31/19 1102  . alum & mag hydroxide-simeth (MAALOX/MYLANTA) 200-200-20 MG/5ML suspension 15 mL  15  mL Oral Q6H PRN Quianna Avery J, PA-C      . atorvastatin (LIPITOR) tablet 40 mg  40 mg Oral q1800 Mauriana Dann J, PA-C   40 mg at 01/30/19 1829  . bisacodyl (DULCOLAX) suppository 10 mg  10 mg Rectal Daily PRN Leslyn Monda, Hulen Shouts, PA-C      . Chlorhexidine Gluconate Cloth 2 % PADS 6 each  6 each Topical Q0600 Waynetta Sandy, MD   6 each at 01/31/19 [phone]  . cholecalciferol (VITAMIN D3) tablet 1,000 Units  1,000 Units Oral Daily Gabriel Earing, PA-C   1,000 Units at 01/31/19 5573  . clopidogrel (PLAVIX) tablet 75 mg  75 mg Oral Daily Gabriel Earing, PA-C   75 mg at 01/31/19 2202  . docusate sodium (COLACE) capsule 100 mg  100 mg Oral BID Gabriel Earing, PA-C   100 mg at 01/31/19 5427  . furosemide (LASIX) tablet 40 mg  40 mg Oral Daily Gabriel Earing, PA-C   40 mg at 01/31/19 0623  . guaiFENesin-dextromethorphan (ROBITUSSIN DM) 100-10 MG/5ML syrup 5 mL  5 mL Oral Q4H PRN Tierre Netto J, PA-C      . heparin injection 5,000 Units  5,000 Units Subcutaneous Q8H Jani Moronta J, PA-C   5,000 Units at 01/31/19 7628  . hydrALAZINE (APRESOLINE) injection 5 mg  5 mg Intravenous Q20 Min PRN Joeanthony Seeling, Hulen Shouts, PA-C      . HYDROcodone-acetaminophen (NORCO/VICODIN) 5-325 MG per tablet 2 tablet  2 tablet Oral Q6H PRN Gabriel Earing, PA-C   2 tablet at 01/31/19 0927  . ipratropium (ATROVENT) nebulizer solution 0.5 mg  0.5 mg Nebulization Q6H PRN Kyce Ging J, PA-C      . labetalol (NORMODYNE) injection 10 mg  10 mg Intravenous Q10 min PRN Aidenn Skellenger J, PA-C      . magnesium sulfate IVPB 2 g 50 mL  2 g Intravenous Daily PRN Mikias Lanz J, PA-C      . metoprolol tartrate (LOPRESSOR) injection 2-5 mg  2-5 mg Intravenous Q2H PRN Antawan Mchugh J, PA-C      . metoprolol tartrate (LOPRESSOR) tablet 50 mg  50 mg Oral BID Jakyah Bradby J, PA-C   50 mg at 01/31/19 0928  . mometasone-formoterol (DULERA) 200-5 MCG/ACT inhaler 2 puff  2 puff Inhalation BID Gabriel Earing,  PA-C   2 puff at 01/31/19 0844  . morphine 2 MG/ML injection 2 mg  2 mg Intravenous Q2H PRN Xabi Wittler, Hulen Shouts, PA-C   2 mg at 01/30/19 2135  . mupirocin ointment (BACTROBAN) 2 % 1 application  1 application Nasal BID Waynetta Sandy, MD   1 application at 31/51/76 [phone]  . ondansetron (ZOFRAN) injection 4 mg  4 mg Intravenous Q6H PRN Vista Sawatzky J, PA-C      . ondansetron (ZOFRAN) tablet 4 mg  4 mg Oral Q6H PRN Tawonda Legaspi J, PA-C      . oxyCODONE (Oxy  IR/ROXICODONE) immediate release tablet 10 mg  10 mg Oral Q6H PRN Gabriel Earing, PA-C   10 mg at 01/30/19 0612  . pantoprazole (PROTONIX) EC tablet 40 mg  40 mg Oral Daily Gabriel Earing, PA-C   40 mg at 01/31/19 4854  . phenol (CHLORASEPTIC) mouth spray 1 spray  1 spray Mouth/Throat PRN Cannon Quinton J, PA-C      . polyethylene glycol (MIRALAX / GLYCOLAX) packet 17 g  17 g Oral Daily PRN Demetress Tift J, PA-C      . potassium chloride SA (K-DUR) CR tablet 20-40 mEq  20-40 mEq Oral Daily PRN Sherry Blackard J, PA-C      . thiamine (VITAMIN B-1) tablet 100 mg  100 mg Oral Daily Deepti Gunawan J, PA-C   100 mg at 01/31/19 6270     Discharge Medications: Please see discharge summary for a list of discharge medications.  Relevant Imaging Results:  Relevant Lab Results:   Additional Information SSN [id number]  Vinie Sill, LCSWA

## 2019-01-31 NOTE — TOC Initial Note (Signed)
Transition of Care [facility]) - Initial/Assessment Note    Patient Details  Name: Jenna Marquez MRN: [id number] Date of Birth: 1945-08-17  [facility]) CM/SW Contact:    Vinie Sill, [place] Phone Number: 01/31/2019, 1:43 PM  Clinical Narrative:       CSW is waiting on insurance authorization. PSARR network is currently down and CSW must verify patient has required PSARR number before discharging patient to SNF.   CSW received consult for discharge needs. CSW spoke with patient's daughter-in law and HCPOA, Nishia, regarding PT recommendation of returning back to [facility] for rehab. Patient's HCPOA and patient agreed to return to [facility].   Patient was excited to be returning back to [facility].  Patient's HCPOA expressed understanding of CSW role and discharge process.  No questions/concerns about plan or treatment at this time.  Thurmond Butts, MSW, [facility] Clinical Social Worker [phone]     Expected Discharge Plan: [place]     Patient Goals and CMS Choice Patient states their goals for this hospitalization and ongoing recovery are:: want [place] back to SNF   Choice offered to / list presented to : [place] / Guardian  Expected Discharge Plan and Services Expected Discharge Plan: [place] In-house Referral: Clinical Social Work     Living arrangements for the past 2 months: [place] Expected Discharge Date: 01/31/19                                    Prior Living Arrangements/Services Living arrangements for the past 2 months: [place]   Patient language and need for interpreter reviewed:: No Do you feel safe going back to the place where you live?: Yes(patient and family wants to return to SNF)      Need for Family Participation in Patient Care: Yes (Comment) Care giver support system in place?: Yes (comment)   Criminal Activity/Legal Involvement Pertinent to  Current Situation/Hospitalization: No - Comment as needed  Activities of Daily Living      Permission Sought/Granted Permission sought to share information with : Facility Art therapist granted to share information with : Yes, Verbal Permission Granted  Share Information with NAME: Hefty,Jacquanisha "Nisha"  Permission granted to share info w AGENCY: [place] granted to share info w Relationship: daughter in [place]  Permission granted to share info w Contact Information: [phone]  Emotional Assessment Appearance:: Appears stated age Attitude/Demeanor/Rapport: Crying, Engaged Affect (typically observed): Accepting, Appropriate, Hopeful, Tearful/Crying Orientation: : Oriented to Self, Oriented to Place, Oriented to  Time, Oriented to Situation Alcohol / Substance Use: Not Applicable Psych Involvement: No (comment)  Admission diagnosis:  GANGRENE LEFT FOOT Patient Active Problem List   Diagnosis Date Noted  . Ischemia of extremity 01/29/2019  . Depression   . Adjustment disorder with depressed mood   . Closed right hip fracture ([place]) 12/23/2018  . Open right hip fracture ([place]) 12/23/2018  . Closed displaced intertrochanteric fracture of right femur ([place]) 12/23/2018  . Chronic respiratory failure with hypoxia ([place]) 04/24/2018  . MDD (major depressive disorder), single episode, severe , no psychosis ([place])   . Hemoptysis 02/19/2018  . Community acquired pneumonia 02/19/2018  . Vagina bleeding 02/19/2018  . Alcohol abuse 02/19/2018  . Left knee pain 07/11/2017  . PVD (peripheral vascular disease) ([place]) 04/04/2017  . HTN (hypertension) 11/24/2016  . Paroxysmal atrial fibrillation ([place]) 09/30/2016  .  Mobitz type 2 second degree AV block 09/30/2016  . Swelling of arm 05/22/2015  . Left shoulder pain 05/22/2015  . CKD (chronic kidney disease) stage 3, GFR 30-59 ml/min (HCC) 05/11/2015  . Pacemaker 04/22/2015  . Generalized anxiety  disorder 01/11/2012  . 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   . Tobacco abuse    PCP:  Copland, Gay Filler, MD Pharmacy:   [id number] [place], [place] [address] Phone: [phone] Fax: [phone]     Social Determinants of Health (SDOH) Interventions    Readmission Risk Interventions No flowsheet data found.

## 2019-01-31 NOTE — Consult Note (Addendum)
   [facility] CM Inpatient Consult   01/31/2019  BROOKIE WAYMENT 1946/06/07 [id number]    Patient screened for potential [facility] care management services needed with a30% extreme high risk score of unplanned readmission; with noted readmission in the last 30 days, 2 hospitalizations and 1 ED in the past 6 months; under her [employer] plan. Patient was outreached and engaged by [facility] care management coordinators and [facility] social worker in the past. Our community based plan of care has focused on disease management and community resource support.  Per chart review andMD note dated 6/18/20show as follows: This is a 73 y.o. female who was admitted to the hospital and taken to the operating room on 01/29/2019 and underwent: Left below knee amputation   Noted during this admission to have dark colored toes left foot this admission. She is confused and can't provide much history. Pt nurse says it only hurts when they touch it. Former smoker, quit 10 years ago. 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  Chart review states that patient is residing at [facility]) prior to admission due to a recent fall resulting in a hip fracture and was receiving rehab there when they began to notice the changes in her foot.   Plan is for her to return to SNF post discharge for continued rehab, and once rehab completed, home with 24 hour care.   Patient's primary care provider is Dr. Janett Billow Copland with [facility] at [facility], listed as providing transition of care follow-up.   Note: Patient will be followed by an external care management group([facility]) post discharge to follow-up needs and for continued case  management services.   For additional questions or referrals,please contact:  Benjimen Kelley A. Amahd Morino, BSN, RN-BC [facility] Liaison Cell: [phone]

## 2019-02-01 ENCOUNTER — Ambulatory Visit: Payer: PPO

## 2019-02-01 DIAGNOSIS — E876 Hypokalemia: Secondary | ICD-10-CM | POA: Diagnosis not present

## 2019-02-01 DIAGNOSIS — F1010 Alcohol abuse, uncomplicated: Secondary | ICD-10-CM | POA: Diagnosis not present

## 2019-02-01 DIAGNOSIS — I441 Atrioventricular block, second degree: Secondary | ICD-10-CM | POA: Diagnosis not present

## 2019-02-01 DIAGNOSIS — K5900 Constipation, unspecified: Secondary | ICD-10-CM | POA: Diagnosis not present

## 2019-02-01 DIAGNOSIS — J449 Chronic obstructive pulmonary disease, unspecified: Secondary | ICD-10-CM | POA: Diagnosis not present

## 2019-02-01 DIAGNOSIS — D500 Iron deficiency anemia secondary to blood loss (chronic): Secondary | ICD-10-CM | POA: Diagnosis not present

## 2019-02-01 DIAGNOSIS — J189 Pneumonia, unspecified organism: Secondary | ICD-10-CM | POA: Diagnosis not present

## 2019-02-01 DIAGNOSIS — F410 Panic disorder [episodic paroxysmal anxiety] without agoraphobia: Secondary | ICD-10-CM | POA: Diagnosis not present

## 2019-02-01 DIAGNOSIS — F411 Generalized anxiety disorder: Secondary | ICD-10-CM | POA: Diagnosis not present

## 2019-02-01 DIAGNOSIS — R0989 Other specified symptoms and signs involving the circulatory and respiratory systems: Secondary | ICD-10-CM | POA: Diagnosis not present

## 2019-02-01 DIAGNOSIS — Z9981 Dependence on supplemental oxygen: Secondary | ICD-10-CM | POA: Diagnosis not present

## 2019-02-01 DIAGNOSIS — S72141D Displaced intertrochanteric fracture of right femur, subsequent encounter for closed fracture with routine healing: Secondary | ICD-10-CM | POA: Diagnosis not present

## 2019-02-01 DIAGNOSIS — I739 Peripheral vascular disease, unspecified: Secondary | ICD-10-CM | POA: Diagnosis not present

## 2019-02-01 DIAGNOSIS — Z7401 Bed confinement status: Secondary | ICD-10-CM | POA: Diagnosis not present

## 2019-02-01 DIAGNOSIS — Z4781 Encounter for orthopedic aftercare following surgical amputation: Secondary | ICD-10-CM | POA: Diagnosis not present

## 2019-02-01 DIAGNOSIS — F419 Anxiety disorder, unspecified: Secondary | ICD-10-CM | POA: Diagnosis not present

## 2019-02-01 DIAGNOSIS — I480 Paroxysmal atrial fibrillation: Secondary | ICD-10-CM | POA: Diagnosis not present

## 2019-02-01 DIAGNOSIS — R062 Wheezing: Secondary | ICD-10-CM | POA: Diagnosis not present

## 2019-02-01 DIAGNOSIS — N183 Chronic kidney disease, stage 3 (moderate): Secondary | ICD-10-CM | POA: Diagnosis not present

## 2019-02-01 DIAGNOSIS — R45851 Suicidal ideations: Secondary | ICD-10-CM | POA: Diagnosis not present

## 2019-02-01 DIAGNOSIS — J9601 Acute respiratory failure with hypoxia: Secondary | ICD-10-CM | POA: Diagnosis not present

## 2019-02-01 DIAGNOSIS — L89610 Pressure ulcer of right heel, unstageable: Secondary | ICD-10-CM | POA: Diagnosis not present

## 2019-02-01 DIAGNOSIS — R5383 Other fatigue: Secondary | ICD-10-CM | POA: Diagnosis not present

## 2019-02-01 DIAGNOSIS — G8929 Other chronic pain: Secondary | ICD-10-CM | POA: Diagnosis not present

## 2019-02-01 DIAGNOSIS — R0600 Dyspnea, unspecified: Secondary | ICD-10-CM | POA: Diagnosis not present

## 2019-02-01 DIAGNOSIS — G4700 Insomnia, unspecified: Secondary | ICD-10-CM | POA: Diagnosis not present

## 2019-02-01 DIAGNOSIS — R41841 Cognitive communication deficit: Secondary | ICD-10-CM | POA: Diagnosis not present

## 2019-02-01 DIAGNOSIS — M2550 Pain in unspecified joint: Secondary | ICD-10-CM | POA: Diagnosis not present

## 2019-02-01 DIAGNOSIS — R0602 Shortness of breath: Secondary | ICD-10-CM | POA: Diagnosis not present

## 2019-02-01 DIAGNOSIS — R52 Pain, unspecified: Secondary | ICD-10-CM | POA: Diagnosis not present

## 2019-02-01 DIAGNOSIS — N178 Other acute kidney failure: Secondary | ICD-10-CM | POA: Diagnosis not present

## 2019-02-01 DIAGNOSIS — E7849 Other hyperlipidemia: Secondary | ICD-10-CM | POA: Diagnosis not present

## 2019-02-01 DIAGNOSIS — I998 Other disorder of circulatory system: Secondary | ICD-10-CM | POA: Diagnosis not present

## 2019-02-01 DIAGNOSIS — M6281 Muscle weakness (generalized): Secondary | ICD-10-CM | POA: Diagnosis not present

## 2019-02-01 DIAGNOSIS — Z4789 Encounter for other orthopedic aftercare: Secondary | ICD-10-CM | POA: Diagnosis not present

## 2019-02-01 DIAGNOSIS — F4321 Adjustment disorder with depressed mood: Secondary | ICD-10-CM | POA: Diagnosis not present

## 2019-02-01 DIAGNOSIS — Z89512 Acquired absence of left leg below knee: Secondary | ICD-10-CM | POA: Diagnosis not present

## 2019-02-01 DIAGNOSIS — F332 Major depressive disorder, recurrent severe without psychotic features: Secondary | ICD-10-CM | POA: Diagnosis not present

## 2019-02-01 DIAGNOSIS — J9611 Chronic respiratory failure with hypoxia: Secondary | ICD-10-CM | POA: Diagnosis not present

## 2019-02-01 DIAGNOSIS — R627 Adult failure to thrive: Secondary | ICD-10-CM | POA: Diagnosis not present

## 2019-02-01 DIAGNOSIS — J810 Acute pulmonary edema: Secondary | ICD-10-CM | POA: Diagnosis not present

## 2019-02-01 DIAGNOSIS — R230 Cyanosis: Secondary | ICD-10-CM | POA: Diagnosis not present

## 2019-02-01 DIAGNOSIS — M79662 Pain in left lower leg: Secondary | ICD-10-CM | POA: Diagnosis not present

## 2019-02-01 MED ORDER — METOPROLOL TARTRATE 5 MG/5ML IV SOLN: 5.0000 mg | Freq: Once | INTRAVENOUS | Status: DC

## 2019-02-01 NOTE — Care Management Important Message (Signed)
Important Message  Patient Details  Name: Jenna Marquez MRN: [id number] Date of Birth: 1945-10-14   Medicare Important Message Given:  Yes     [name] 02/01/2019, 12:02 PM

## 2019-02-01 NOTE — TOC Transition Note (Signed)
Transition of Care [facility]) - CM/SW Discharge Note   Patient Details  Name: ALEXIYA FRANQUI MRN: [id number] Date of Birth: 1945-10-27  Transition of Care [facility]) CM/SW Contact:  Gelene Mink, [place] Phone Number: 02/01/2019, 4:03 PM   Clinical Narrative:      Patient will DC to: [place] date: 02/01/2019 Family notified: Yes Transport by: Corey Harold   Per MD patient ready for DC to . RN, patient, patient's family, and facility notified of DC. Discharge Summary and FL2 sent to facility. RN to call report prior to discharge [phone]). The patient will report to room 116.  DC packet on chart. Ambulance transport requested for patient.   CSW will sign off for now as social work intervention is no longer needed. Please consult [place] again if new needs arise.  Orrie Lascano, LCSW-A [place]/Clinical Social Work Department Cell: [phone]     Final next level of care: [place] Barriers to Discharge: No Barriers Identified   Patient Goals and CMS Choice Patient states their goals for this hospitalization and ongoing recovery are:: want [place] back to SNF CMS Medicare.gov Compare Post Acute Care list provided to:: Other (Comment Required)(Pt will return back to [facility]) Choice offered to / list presented to : NA  Discharge Placement   Existing PASRR number confirmed : (Still pending but facility is able to take)          Patient chooses bed at: [facility] Patient to be transferred to facility by: [place] Name of family member notified: Nisha Patient and family notified of of transfer: 02/01/19  Discharge Plan and Services In-house Referral: Clinical Social Work              DME Arranged: N/A DME Agency: NA                  Social Determinants of Health ([place]) Interventions     Readmission Risk Interventions No flowsheet data found.

## 2019-02-01 NOTE — Progress Notes (Signed)
CSW faxed clinicals for PASSR number. CSW will continue to follow.   Domenic Schwab, MSW, [place]

## 2019-02-01 NOTE — Progress Notes (Addendum)
Report given to [facility], to [place], LPN at 0981.  All questions addressed.  Patient has no IVs and CCMD called.  [facility] transport came to transport patient.  Pt had wheelchair and oxygen tank that is her own personal.  I contacted family Zarea Diesing, son) to pick up belongings.  He said he would come to the hospital tomorrow.

## 2019-02-01 NOTE — Progress Notes (Signed)
  Progress Note    02/01/2019 8:17 AM 3 Days Post-Op  Subjective: Still tearful and complains of pain in left below-knee amputation site  Vitals:   01/31/19 2121 02/01/19 0647  BP: (!) 126/58 (!) 143/51  Pulse: 77 87  Resp: 12 15  Temp:  98 F (36.7 C)  SpO2: 100% 96%    Physical Exam: Awake and alert Left low knee amputation site healing well with staples in place  CBC    Component Value Date/Time   WBC 9.8 01/31/2019 0418   RBC 2.28 (L) 01/31/2019 0418   HGB 6.9 (LL) 01/31/2019 0418   HCT 21.6 (L) 01/31/2019 0418   PLT 466 (H) 01/31/2019 0418   MCV 94.7 01/31/2019 0418   MCH 30.3 01/31/2019 0418   MCHC 31.9 01/31/2019 0418   RDW 16.8 (H) 01/31/2019 0418   LYMPHSABS 1.1 01/02/2019 1116   MONOABS 1.5 (H) 01/02/2019 1116   EOSABS 0.2 01/02/2019 1116   BASOSABS 0.1 01/02/2019 1116    BMET    Component Value Date/Time   NA 137 01/31/2019 0418   K 3.4 (L) 01/31/2019 0418   CL 99 01/31/2019 0418   CO2 30 01/31/2019 0418   GLUCOSE 97 01/31/2019 0418   BUN 20 01/31/2019 0418   CREATININE 2.21 (H) 01/31/2019 0418   CALCIUM 7.9 (L) 01/31/2019 0418   GFRNONAA 21 (L) 01/31/2019 0418   GFRAA 24 (L) 01/31/2019 0418    INR    Component Value Date/Time   INR 1.0 12/23/2018 0748     Intake/Output Summary (Last 24 hours) at 02/01/2019 0817 Last data filed at 02/01/2019 0700 Gross per 24 hour  Intake 440 ml  Output 230 ml  Net 210 ml     Assessment:  73 y.o. female is s/p left below-knee amputation for gangrenous changes to her left foot.  Plan: Okay for discharge to SNF when bed available Follow-up for staple removal in a few weeks.   Jenna Marquez C. Donzetta Matters, MD [facility] Office: [phone] Pager: [phone]  02/01/2019 8:17 AM

## 2019-02-02 LAB — BPAM RBC
Blood Product Expiration Date: 202006232359
Blood Product Expiration Date: 202006232359
ISSUE DATE / TIME: 202006161306
ISSUE DATE / TIME: 202006161306
Unit Type and Rh: 9500
Unit Type and Rh: 9500

## 2019-02-02 LAB — TYPE AND SCREEN
ABO/RH(D): O NEG
Antibody Screen: NEGATIVE
Unit division: 0
Unit division: 0

## 2019-02-05 DIAGNOSIS — J449 Chronic obstructive pulmonary disease, unspecified: Secondary | ICD-10-CM | POA: Diagnosis not present

## 2019-02-05 DIAGNOSIS — I480 Paroxysmal atrial fibrillation: Secondary | ICD-10-CM | POA: Diagnosis not present

## 2019-02-05 DIAGNOSIS — I739 Peripheral vascular disease, unspecified: Secondary | ICD-10-CM | POA: Diagnosis not present

## 2019-02-05 DIAGNOSIS — J9601 Acute respiratory failure with hypoxia: Secondary | ICD-10-CM | POA: Diagnosis not present

## 2019-02-06 DIAGNOSIS — F410 Panic disorder [episodic paroxysmal anxiety] without agoraphobia: Secondary | ICD-10-CM | POA: Diagnosis not present

## 2019-02-06 DIAGNOSIS — J449 Chronic obstructive pulmonary disease, unspecified: Secondary | ICD-10-CM | POA: Diagnosis not present

## 2019-02-06 DIAGNOSIS — R0602 Shortness of breath: Secondary | ICD-10-CM | POA: Diagnosis not present

## 2019-02-06 DIAGNOSIS — R062 Wheezing: Secondary | ICD-10-CM | POA: Diagnosis not present

## 2019-02-07 DIAGNOSIS — R0602 Shortness of breath: Secondary | ICD-10-CM | POA: Diagnosis not present

## 2019-02-07 DIAGNOSIS — F332 Major depressive disorder, recurrent severe without psychotic features: Secondary | ICD-10-CM | POA: Diagnosis not present

## 2019-02-07 DIAGNOSIS — J449 Chronic obstructive pulmonary disease, unspecified: Secondary | ICD-10-CM | POA: Diagnosis not present

## 2019-02-07 DIAGNOSIS — R062 Wheezing: Secondary | ICD-10-CM | POA: Diagnosis not present

## 2019-02-08 DIAGNOSIS — R0602 Shortness of breath: Secondary | ICD-10-CM | POA: Diagnosis not present

## 2019-02-08 DIAGNOSIS — R062 Wheezing: Secondary | ICD-10-CM | POA: Diagnosis not present

## 2019-02-08 DIAGNOSIS — F410 Panic disorder [episodic paroxysmal anxiety] without agoraphobia: Secondary | ICD-10-CM | POA: Diagnosis not present

## 2019-02-08 DIAGNOSIS — F411 Generalized anxiety disorder: Secondary | ICD-10-CM | POA: Diagnosis not present

## 2019-02-08 DIAGNOSIS — J449 Chronic obstructive pulmonary disease, unspecified: Secondary | ICD-10-CM | POA: Diagnosis not present

## 2019-02-11 DIAGNOSIS — M79662 Pain in left lower leg: Secondary | ICD-10-CM | POA: Diagnosis not present

## 2019-02-11 DIAGNOSIS — G8929 Other chronic pain: Secondary | ICD-10-CM | POA: Diagnosis not present

## 2019-02-11 DIAGNOSIS — K5900 Constipation, unspecified: Secondary | ICD-10-CM | POA: Diagnosis not present

## 2019-02-11 DIAGNOSIS — R52 Pain, unspecified: Secondary | ICD-10-CM | POA: Diagnosis not present

## 2019-02-12 DIAGNOSIS — F410 Panic disorder [episodic paroxysmal anxiety] without agoraphobia: Secondary | ICD-10-CM | POA: Diagnosis not present

## 2019-02-12 DIAGNOSIS — R0602 Shortness of breath: Secondary | ICD-10-CM | POA: Diagnosis not present

## 2019-02-12 DIAGNOSIS — G4700 Insomnia, unspecified: Secondary | ICD-10-CM | POA: Diagnosis not present

## 2019-02-12 DIAGNOSIS — F419 Anxiety disorder, unspecified: Secondary | ICD-10-CM | POA: Diagnosis not present

## 2019-02-13 DIAGNOSIS — F419 Anxiety disorder, unspecified: Secondary | ICD-10-CM | POA: Diagnosis not present

## 2019-02-13 DIAGNOSIS — F410 Panic disorder [episodic paroxysmal anxiety] without agoraphobia: Secondary | ICD-10-CM | POA: Diagnosis not present

## 2019-02-13 DIAGNOSIS — G4700 Insomnia, unspecified: Secondary | ICD-10-CM | POA: Diagnosis not present

## 2019-02-13 DIAGNOSIS — R0602 Shortness of breath: Secondary | ICD-10-CM | POA: Diagnosis not present

## 2019-02-14 DIAGNOSIS — G8929 Other chronic pain: Secondary | ICD-10-CM | POA: Diagnosis not present

## 2019-02-14 DIAGNOSIS — L89610 Pressure ulcer of right heel, unstageable: Secondary | ICD-10-CM | POA: Diagnosis not present

## 2019-02-14 DIAGNOSIS — R0602 Shortness of breath: Secondary | ICD-10-CM | POA: Diagnosis not present

## 2019-02-14 DIAGNOSIS — F410 Panic disorder [episodic paroxysmal anxiety] without agoraphobia: Secondary | ICD-10-CM | POA: Diagnosis not present

## 2019-02-14 DIAGNOSIS — F419 Anxiety disorder, unspecified: Secondary | ICD-10-CM | POA: Diagnosis not present

## 2019-02-18 ENCOUNTER — Telehealth (HOSPITAL_COMMUNITY): Payer: Self-pay

## 2019-02-18 IMAGING — CT CT CERVICAL SPINE W/O CM
4 of 7 series · 14 of 33 positions shown, 16 images · non-contrast
Comparison: MRI brain 11/03/2012

CLINICAL DATA: Fall tonight. Frontal hematoma.

EXAM:
CT HEAD WITHOUT CONTRAST
CT CERVICAL SPINE WITHOUT CONTRAST
TECHNIQUE: Multidetector CT imaging of the head and cervical spine was
performed following the standard protocol without intravenous
contrast. Multiplanar CT image reconstructions of the cervical spine
were also generated.

[Series 8: c_spine 2.0 i30s 3 · axial · 0.27mm/px · z∈[-294,-218]mm · 3 of 75 slices shown]
[im 19/75  bone]
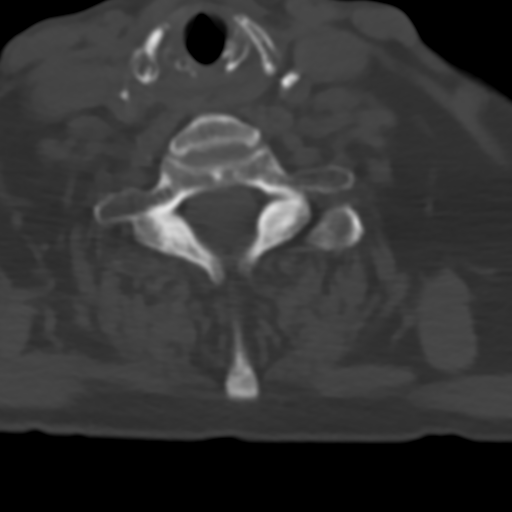
[im 38/75  bone]
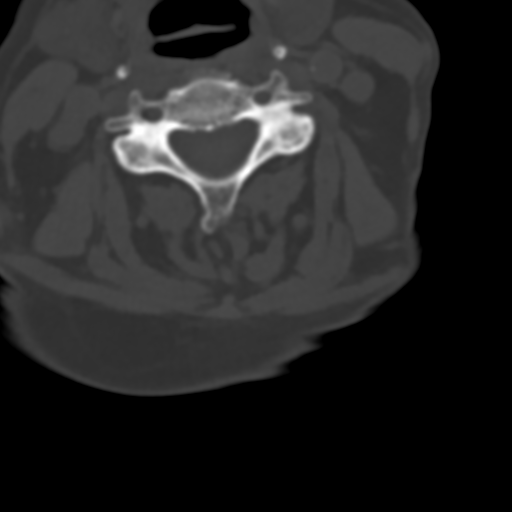
[im 56/75  bone]
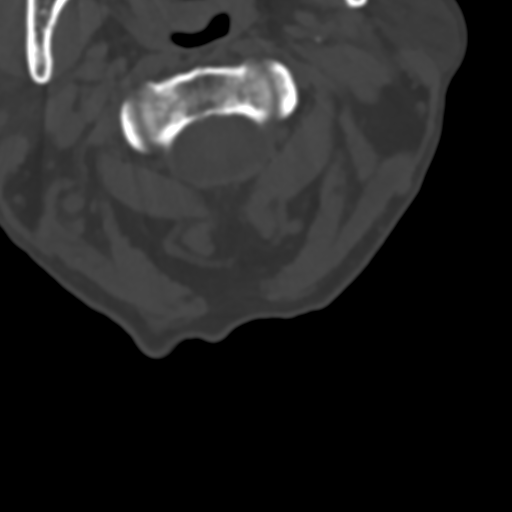

[Series 10: coronals · coronal · 0.28mm/px · 1 of 66 slices shown]
[im 33/66  bone]
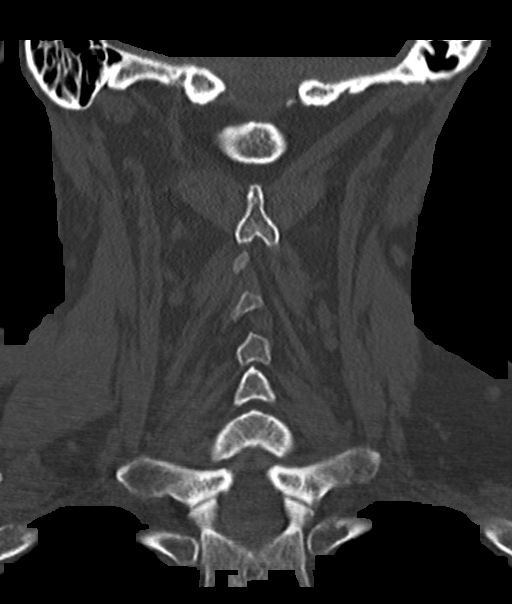

[Series 11: sagittals · sagittal · 0.32mm/px · 5 of 52 slices shown]
[im 9/52  bone]
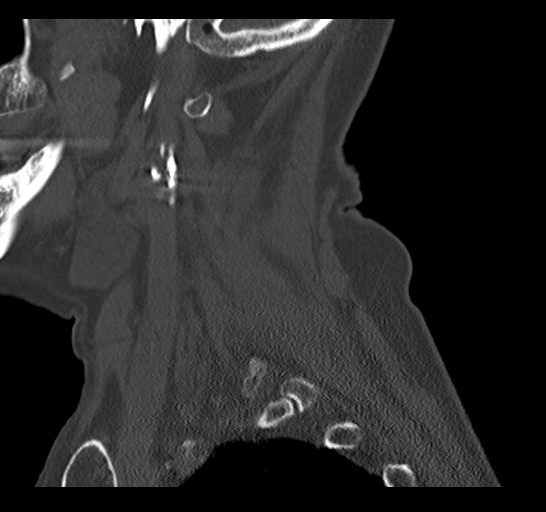
[im 18/52  bone]
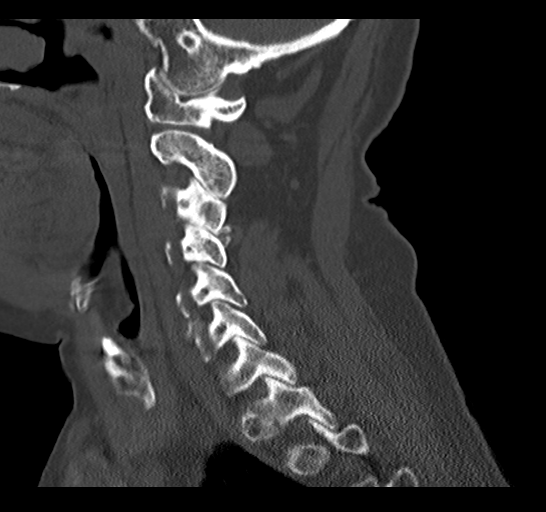
[im 26/52  bone]
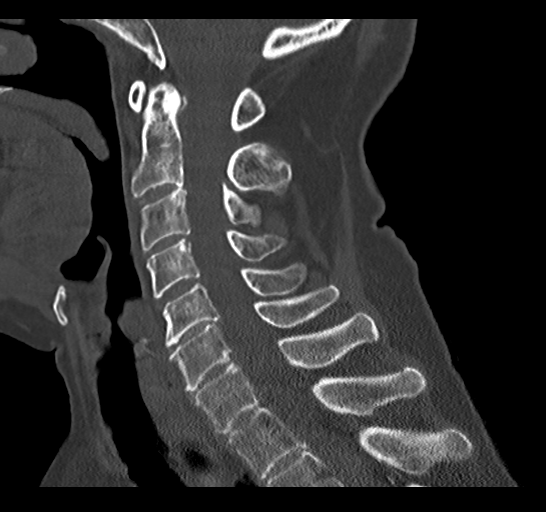
[im 35/52  bone]
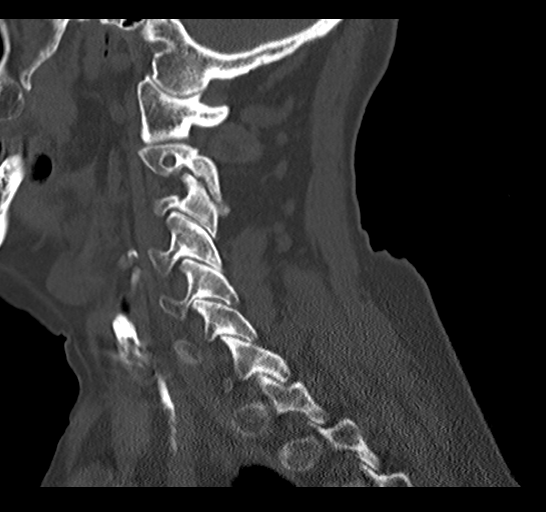
[im 43/52  bone]
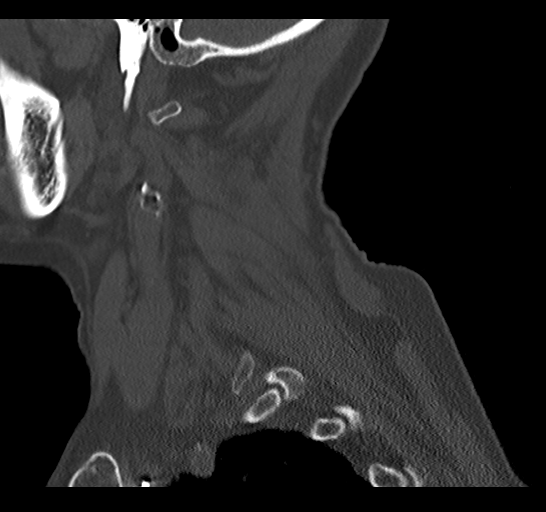

[Series 12: orthogonals · axial · 0.26mm/px · z∈[-329,-213]mm · 5 of 92 slices shown, 7 images]
[im 16/92  soft-tissue]
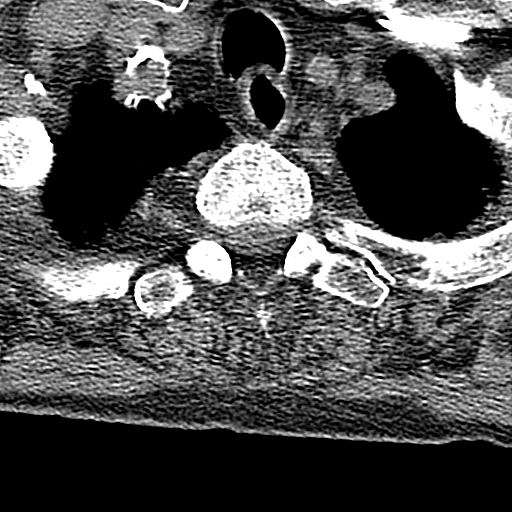
[im 16/92  bone]
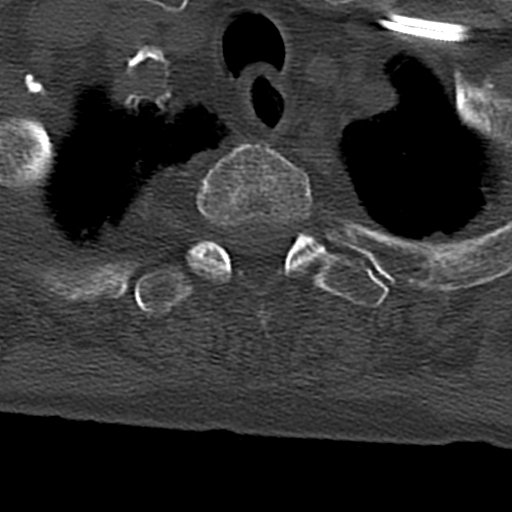
[im 31/92  bone]
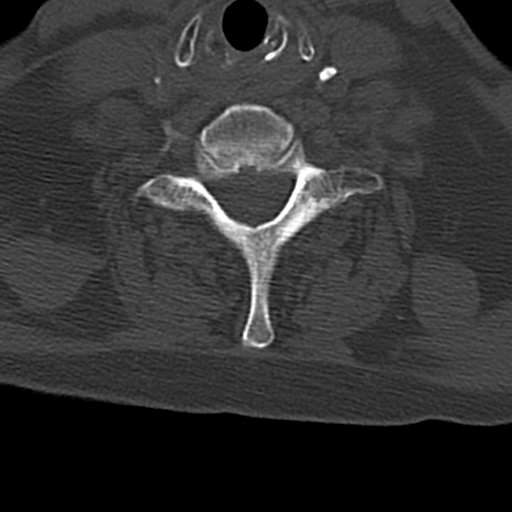
[im 46/92  bone]
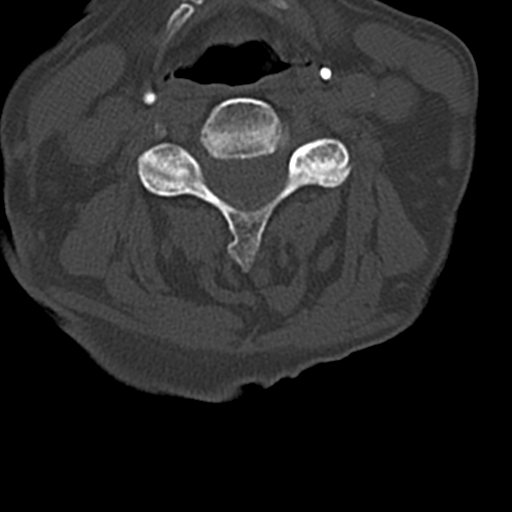
[im 61/92  bone]
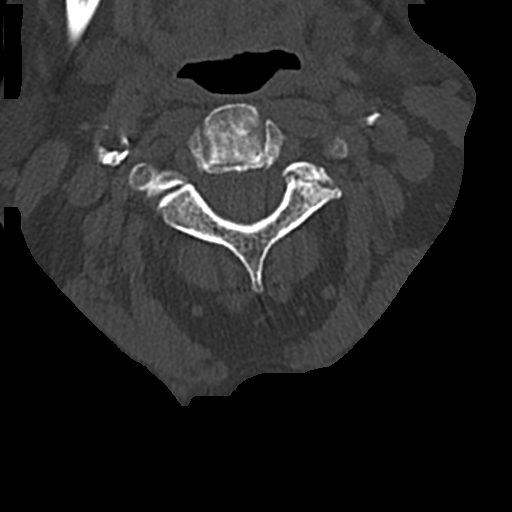
[im 76/92  soft-tissue]
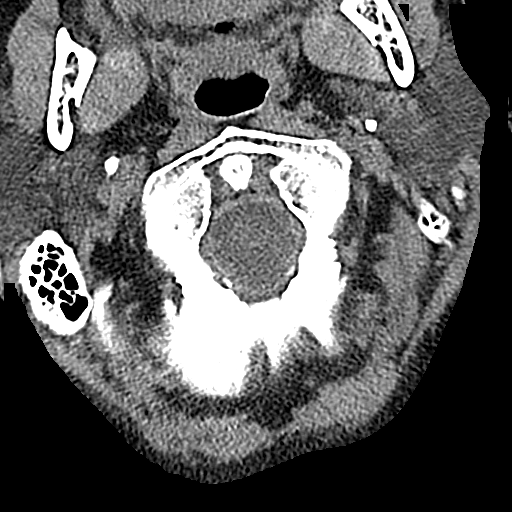
[im 76/92  bone]
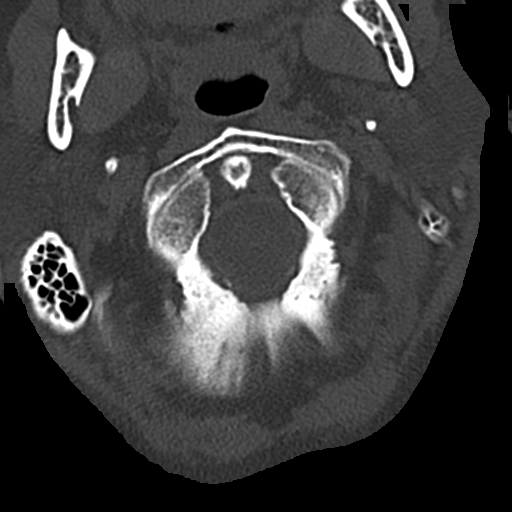

[14 of 33 positions shown; findings below may reference images not displayed]

FINDINGS: CT HEAD FINDINGS

Brain: Mild diffuse cerebral atrophy. Ventricular dilatation
consistent with central atrophy. Low-attenuation changes in the deep
white matter consistent with small vessel ischemia. No evidence of
acute infarction, hemorrhage, hydrocephalus, extra-axial collection
or mass lesion/mass effect.

Vascular: Moderate intracranial arterial vascular calcifications are
present.

Skull: Calvarium appears intact.

Sinuses/Orbits: Paranasal sinuses and mastoid air cells are clear.

Other: None.

CT CERVICAL SPINE FINDINGS

Alignment: Normal alignment of the cervical vertebrae and facet
joints. C1-2 articulation appears intact.

Skull base and vertebrae: No vertebral compression deformities. No
focal bone lesion or bone destruction.

Soft tissues and spinal canal: No abnormal paraspinal soft tissue
mass or infiltration. Vascular calcifications.

Disc levels: Degenerative changes throughout the cervical spine with
narrowed interspaces and endplate hypertrophic changes, most
prominent at C5-6, C6-7 and C7-T1 levels. Degenerative changes in
the facet joints.

Upper chest: Emphysematous changes in the lung apices. Vascular
calcifications.

Other: None.
IMPRESSION: 1. No acute intracranial abnormalities. Chronic atrophy and small
vessel ischemic changes.
2. Normal alignment of the cervical spine. Degenerative changes. No
acute displaced fractures identified.

## 2019-02-18 NOTE — Telephone Encounter (Signed)
Follow up phone call with Asa Lente to assess how pt is doing. She states she seems to be doing well at [employer]. Facility handles all of patient's follow up appointments.

## 2019-02-19 DIAGNOSIS — R0989 Other specified symptoms and signs involving the circulatory and respiratory systems: Secondary | ICD-10-CM | POA: Diagnosis not present

## 2019-02-19 DIAGNOSIS — F332 Major depressive disorder, recurrent severe without psychotic features: Secondary | ICD-10-CM | POA: Diagnosis not present

## 2019-02-19 DIAGNOSIS — R627 Adult failure to thrive: Secondary | ICD-10-CM | POA: Diagnosis not present

## 2019-02-19 DIAGNOSIS — R0602 Shortness of breath: Secondary | ICD-10-CM | POA: Diagnosis not present

## 2019-02-20 DIAGNOSIS — R0989 Other specified symptoms and signs involving the circulatory and respiratory systems: Secondary | ICD-10-CM | POA: Diagnosis not present

## 2019-02-20 DIAGNOSIS — R5383 Other fatigue: Secondary | ICD-10-CM | POA: Diagnosis not present

## 2019-02-20 DIAGNOSIS — J189 Pneumonia, unspecified organism: Secondary | ICD-10-CM | POA: Diagnosis not present

## 2019-02-20 DIAGNOSIS — R627 Adult failure to thrive: Secondary | ICD-10-CM | POA: Diagnosis not present

## 2019-02-21 DIAGNOSIS — L89610 Pressure ulcer of right heel, unstageable: Secondary | ICD-10-CM | POA: Diagnosis not present

## 2019-02-21 DIAGNOSIS — R5383 Other fatigue: Secondary | ICD-10-CM | POA: Diagnosis not present

## 2019-02-21 DIAGNOSIS — J189 Pneumonia, unspecified organism: Secondary | ICD-10-CM | POA: Diagnosis not present

## 2019-02-21 DIAGNOSIS — R0989 Other specified symptoms and signs involving the circulatory and respiratory systems: Secondary | ICD-10-CM | POA: Diagnosis not present

## 2019-02-21 DIAGNOSIS — R627 Adult failure to thrive: Secondary | ICD-10-CM | POA: Diagnosis not present

## 2019-02-22 DIAGNOSIS — R0989 Other specified symptoms and signs involving the circulatory and respiratory systems: Secondary | ICD-10-CM | POA: Diagnosis not present

## 2019-02-22 DIAGNOSIS — J189 Pneumonia, unspecified organism: Secondary | ICD-10-CM | POA: Diagnosis not present

## 2019-02-22 DIAGNOSIS — R627 Adult failure to thrive: Secondary | ICD-10-CM | POA: Diagnosis not present

## 2019-02-22 DIAGNOSIS — R5383 Other fatigue: Secondary | ICD-10-CM | POA: Diagnosis not present

## 2019-02-25 DIAGNOSIS — R1312 Dysphagia, oropharyngeal phase: Secondary | ICD-10-CM | POA: Diagnosis not present

## 2019-02-25 DIAGNOSIS — J189 Pneumonia, unspecified organism: Secondary | ICD-10-CM | POA: Diagnosis not present

## 2019-02-25 DIAGNOSIS — R627 Adult failure to thrive: Secondary | ICD-10-CM | POA: Diagnosis not present

## 2019-02-25 DIAGNOSIS — R0989 Other specified symptoms and signs involving the circulatory and respiratory systems: Secondary | ICD-10-CM | POA: Diagnosis not present

## 2019-02-26 DIAGNOSIS — R1312 Dysphagia, oropharyngeal phase: Secondary | ICD-10-CM | POA: Diagnosis not present

## 2019-02-26 DIAGNOSIS — J189 Pneumonia, unspecified organism: Secondary | ICD-10-CM | POA: Diagnosis not present

## 2019-02-26 DIAGNOSIS — R627 Adult failure to thrive: Secondary | ICD-10-CM | POA: Diagnosis not present

## 2019-02-26 DIAGNOSIS — R0989 Other specified symptoms and signs involving the circulatory and respiratory systems: Secondary | ICD-10-CM | POA: Diagnosis not present

## 2019-02-27 DIAGNOSIS — R0989 Other specified symptoms and signs involving the circulatory and respiratory systems: Secondary | ICD-10-CM | POA: Diagnosis not present

## 2019-02-27 DIAGNOSIS — R627 Adult failure to thrive: Secondary | ICD-10-CM | POA: Diagnosis not present

## 2019-02-27 DIAGNOSIS — R1312 Dysphagia, oropharyngeal phase: Secondary | ICD-10-CM | POA: Diagnosis not present

## 2019-02-27 DIAGNOSIS — J189 Pneumonia, unspecified organism: Secondary | ICD-10-CM | POA: Diagnosis not present

## 2019-03-01 DIAGNOSIS — J189 Pneumonia, unspecified organism: Secondary | ICD-10-CM | POA: Diagnosis not present

## 2019-03-01 DIAGNOSIS — R627 Adult failure to thrive: Secondary | ICD-10-CM | POA: Diagnosis not present

## 2019-03-01 DIAGNOSIS — F332 Major depressive disorder, recurrent severe without psychotic features: Secondary | ICD-10-CM | POA: Diagnosis not present

## 2019-03-01 DIAGNOSIS — R0989 Other specified symptoms and signs involving the circulatory and respiratory systems: Secondary | ICD-10-CM | POA: Diagnosis not present

## 2019-03-04 ENCOUNTER — Ambulatory Visit: Payer: PPO | Admitting: Family

## 2019-03-04 ENCOUNTER — Ambulatory Visit: Payer: PPO

## 2019-03-16 DEATH — deceased

## 2019-04-16 ENCOUNTER — Encounter: Payer: PPO | Admitting: Cardiology
